# Patient Record
Sex: Male | Born: 1945 | Race: White | Hispanic: No | Marital: Married | Smoking: Former smoker
Health system: Southern US, Community
[De-identification: ages and names within clinical notes are randomized; demographics above are authoritative.]

## PROBLEM LIST (undated history)

## (undated) DIAGNOSIS — Z972 Presence of dental prosthetic device (complete) (partial): Secondary | ICD-10-CM

## (undated) DIAGNOSIS — H269 Unspecified cataract: Secondary | ICD-10-CM

## (undated) DIAGNOSIS — M1990 Unspecified osteoarthritis, unspecified site: Secondary | ICD-10-CM

## (undated) DIAGNOSIS — I739 Peripheral vascular disease, unspecified: Secondary | ICD-10-CM

## (undated) DIAGNOSIS — I639 Cerebral infarction, unspecified: Secondary | ICD-10-CM

## (undated) DIAGNOSIS — E039 Hypothyroidism, unspecified: Secondary | ICD-10-CM

## (undated) DIAGNOSIS — I2510 Atherosclerotic heart disease of native coronary artery without angina pectoris: Secondary | ICD-10-CM

## (undated) DIAGNOSIS — E119 Type 2 diabetes mellitus without complications: Secondary | ICD-10-CM

## (undated) DIAGNOSIS — J189 Pneumonia, unspecified organism: Secondary | ICD-10-CM

## (undated) DIAGNOSIS — I714 Abdominal aortic aneurysm, without rupture, unspecified: Secondary | ICD-10-CM

## (undated) DIAGNOSIS — I509 Heart failure, unspecified: Secondary | ICD-10-CM

## (undated) DIAGNOSIS — I10 Essential (primary) hypertension: Secondary | ICD-10-CM

## (undated) DIAGNOSIS — J449 Chronic obstructive pulmonary disease, unspecified: Secondary | ICD-10-CM

## (undated) DIAGNOSIS — I779 Disorder of arteries and arterioles, unspecified: Secondary | ICD-10-CM

## (undated) DIAGNOSIS — E785 Hyperlipidemia, unspecified: Secondary | ICD-10-CM

## (undated) DIAGNOSIS — Z973 Presence of spectacles and contact lenses: Secondary | ICD-10-CM

## (undated) DIAGNOSIS — R112 Nausea with vomiting, unspecified: Secondary | ICD-10-CM

## (undated) DIAGNOSIS — H409 Unspecified glaucoma: Secondary | ICD-10-CM

## (undated) DIAGNOSIS — A6920 Lyme disease, unspecified: Secondary | ICD-10-CM

## (undated) DIAGNOSIS — C801 Malignant (primary) neoplasm, unspecified: Secondary | ICD-10-CM

## (undated) DIAGNOSIS — Z9889 Other specified postprocedural states: Secondary | ICD-10-CM

## (undated) HISTORY — PX: NECK SURGERY: SHX720

## (undated) HISTORY — DX: Lyme disease, unspecified: A69.20

## (undated) HISTORY — DX: Type 2 diabetes mellitus without complications: E11.9

## (undated) HISTORY — DX: Atherosclerotic heart disease of native coronary artery without angina pectoris: I25.10

## (undated) HISTORY — DX: Hypothyroidism, unspecified: E03.9

## (undated) HISTORY — DX: Peripheral vascular disease, unspecified: I73.9

## (undated) HISTORY — PX: WISDOM TOOTH EXTRACTION: SHX21

## (undated) HISTORY — PX: CERVICAL DISC SURGERY: SHX588

## (undated) HISTORY — DX: Hyperlipidemia, unspecified: E78.5

## (undated) HISTORY — PX: CATARACT EXTRACTION, BILATERAL: SHX1313

## (undated) HISTORY — DX: Abdominal aortic aneurysm, without rupture, unspecified: I71.40

## (undated) HISTORY — DX: Essential (primary) hypertension: I10

## (undated) HISTORY — DX: Disorder of arteries and arterioles, unspecified: I77.9

## (undated) HISTORY — PX: JOINT REPLACEMENT: SHX530

## (undated) HISTORY — PX: EYE SURGERY: SHX253

## (undated) HISTORY — PX: MELANOMA EXCISION: SHX5266

## (undated) HISTORY — PX: REPLACEMENT TOTAL KNEE: SUR1224

## (undated) HISTORY — PX: ELBOW SURGERY: SHX618

## (undated) HISTORY — PX: CATARACT EXTRACTION: SUR2

---
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
## 1988-12-28 DIAGNOSIS — C801 Malignant (primary) neoplasm, unspecified: Secondary | ICD-10-CM

## 1988-12-28 HISTORY — DX: Malignant (primary) neoplasm, unspecified: C80.1

## 2003-11-09 ENCOUNTER — Ambulatory Visit (HOSPITAL_COMMUNITY): Admission: RE | Admit: 2003-11-09 | Discharge: 2003-11-10 | Payer: Self-pay | Admitting: Cardiology

## 2003-11-09 ENCOUNTER — Encounter: Payer: Self-pay | Admitting: Cardiology

## 2003-11-10 ENCOUNTER — Encounter: Payer: Self-pay | Admitting: Cardiology

## 2005-05-18 ENCOUNTER — Inpatient Hospital Stay (HOSPITAL_COMMUNITY): Admission: AD | Admit: 2005-05-18 | Discharge: 2005-05-21 | Payer: Self-pay | Admitting: Cardiology

## 2005-05-18 ENCOUNTER — Encounter: Payer: Self-pay | Admitting: Cardiology

## 2005-05-18 ENCOUNTER — Ambulatory Visit: Payer: Self-pay | Admitting: Cardiology

## 2005-05-19 ENCOUNTER — Encounter: Payer: Self-pay | Admitting: Cardiology

## 2005-05-20 ENCOUNTER — Ambulatory Visit: Payer: Self-pay | Admitting: Cardiology

## 2005-05-20 ENCOUNTER — Encounter: Payer: Self-pay | Admitting: Cardiology

## 2005-05-20 ENCOUNTER — Ambulatory Visit: Payer: Self-pay | Admitting: *Deleted

## 2005-05-21 ENCOUNTER — Encounter: Payer: Self-pay | Admitting: Cardiology

## 2005-06-15 ENCOUNTER — Ambulatory Visit: Payer: Self-pay | Admitting: Cardiology

## 2005-07-16 ENCOUNTER — Encounter: Payer: Self-pay | Admitting: Cardiology

## 2006-10-06 ENCOUNTER — Encounter: Payer: Self-pay | Admitting: Cardiology

## 2006-10-13 ENCOUNTER — Ambulatory Visit: Payer: Self-pay | Admitting: Cardiology

## 2006-10-13 ENCOUNTER — Encounter: Payer: Self-pay | Admitting: Cardiology

## 2007-05-09 ENCOUNTER — Encounter: Payer: Self-pay | Admitting: Cardiology

## 2007-11-14 ENCOUNTER — Encounter: Payer: Self-pay | Admitting: Cardiology

## 2008-12-03 ENCOUNTER — Encounter: Payer: Self-pay | Admitting: Cardiology

## 2009-04-27 ENCOUNTER — Encounter: Payer: Self-pay | Admitting: Cardiology

## 2009-04-27 ENCOUNTER — Encounter: Admission: RE | Admit: 2009-04-27 | Discharge: 2009-04-27 | Payer: Self-pay | Admitting: Neurosurgery

## 2009-12-09 ENCOUNTER — Encounter: Payer: Self-pay | Admitting: Cardiology

## 2009-12-13 ENCOUNTER — Encounter: Payer: Self-pay | Admitting: Cardiology

## 2010-06-23 ENCOUNTER — Encounter: Payer: Self-pay | Admitting: Cardiology

## 2010-07-03 ENCOUNTER — Encounter (INDEPENDENT_AMBULATORY_CARE_PROVIDER_SITE_OTHER): Payer: Self-pay | Admitting: Orthopedic Surgery

## 2010-07-03 ENCOUNTER — Inpatient Hospital Stay (HOSPITAL_COMMUNITY): Admission: RE | Admit: 2010-07-03 | Discharge: 2010-07-05 | Payer: Self-pay | Admitting: Orthopedic Surgery

## 2010-07-05 ENCOUNTER — Encounter: Payer: Self-pay | Admitting: Cardiology

## 2010-09-16 ENCOUNTER — Encounter: Payer: Self-pay | Admitting: Cardiology

## 2010-09-17 ENCOUNTER — Ambulatory Visit: Payer: Self-pay | Admitting: Cardiology

## 2010-09-21 ENCOUNTER — Encounter: Payer: Self-pay | Admitting: Cardiology

## 2010-12-28 HISTORY — PX: COLONOSCOPY: SHX174

## 2011-01-27 NOTE — Letter (Signed)
Summary: Discharge Summary  Discharge Summary   Imported By: Dorise Hiss 09/17/2010 10:18:02  _____________________________________________________________________  External Attachment:    Type:   Image     Comment:   External Document

## 2011-01-27 NOTE — Op Note (Signed)
Summary: Operative Report  Operative Report   Imported By: Dorise Hiss 09/17/2010 10:16:36  _____________________________________________________________________  External Attachment:    Type:   Image     Comment:   External Document

## 2011-01-27 NOTE — Miscellaneous (Signed)
  Clinical Lists Changes  Problems: Added new problem of PVD (ICD-443.9) Added new problem of CAD (ICD-414.00) Added new problem of DYSLIPIDEMIA (ICD-272.4) Added new problem of HYPERTENSION (ICD-401.9) Added new problem of HYPOTHYROIDISM (ICD-244.9) Added new problem of TOBACCO ABUSE (ICD-305.1) Observations: Added new observation of PAST MED HX: PAD   stent Left common iliac  (patent at cath 2006) CAD  cath 2006...moderate 2 vessel....(followed by nuclear-no ischemia) EF  normal...cath...2006... /  echo....2006  normal EF...no significant MR Dyslipidemia Hypertension Hypothyroidism Tobacco abuse Hx (09/16/2010 10:08)       Past History:  Past Medical History: PAD   stent Left common iliac  (patent at cath 2006) CAD  cath 2006...moderate 2 vessel....(followed by nuclear-no ischemia) EF  normal...cath...2006... /  echo....2006  normal EF...no significant MR Dyslipidemia Hypertension Hypothyroidism Tobacco abuse Hx

## 2011-01-27 NOTE — Progress Notes (Signed)
Summary: Office Visit/ [facility] OFFICE NOTE  Office Visit/ [place] OFFICE NOTE   Imported By: Dorise Hiss 09/17/2010 08:27:21  _____________________________________________________________________  External Attachment:    Type:   Image     Comment:   External Document

## 2011-01-27 NOTE — Miscellaneous (Signed)
  Clinical Lists Changes  Problems: Added new problem of CAROTID ARTERY DISEASE (ICD-433.10) Observations: Added new observation of PAST MED HX: PAD   stent Left common iliac  (patent at cath 2006) CAD  cath 2006...moderate 2 vessel....(followed by nuclear-no ischemia) EF  normal...cath...2006... /  echo....2006  normal EF...no significant MR Dyslipidemia Hypertension Hypothyroidism Tobacco abuse Hx...in the past Carotid artery disease   doppler (Dr. Sherryll Burger) ..50-69% bilateral  12/09/2009 (09/21/2010 15:43) Added new observation of PRIMARY MD: Ashish Shah,MD (09/21/2010 15:43)       Past History:  Past Medical History: PAD   stent Left common iliac  (patent at cath 2006) CAD  cath 2006...moderate 2 vessel....(followed by nuclear-no ischemia) EF  normal...cath...2006... /  echo....2006  normal EF...no significant MR Dyslipidemia Hypertension Hypothyroidism Tobacco abuse Hx...in the past Carotid artery disease   doppler (Dr. Sherryll Burger) ..50-69% bilateral  12/09/2009

## 2011-01-27 NOTE — Letter (Signed)
Summary: Discharge Summary  Discharge Summary   Imported By: Dorise Hiss 09/17/2010 08:32:38  _____________________________________________________________________  External Attachment:    Type:   Image     Comment:   External Document

## 2011-01-27 NOTE — Consult Note (Signed)
Summary: Consultation Report/ [facility]  Consultation Report/ [facility]   Imported By: Dorise Hiss 09/17/2010 08:31:02  _____________________________________________________________________  External Attachment:    Type:   Image     Comment:   External Document

## 2011-01-27 NOTE — Cardiovascular Report (Signed)
Summary: Cardiac Catheterization  Cardiac Catheterization   Imported By: Dorise Hiss 09/17/2010 10:14:52  _____________________________________________________________________  External Attachment:    Type:   Image     Comment:   External Document

## 2011-01-27 NOTE — Miscellaneous (Signed)
  Clinical Lists Changes  Observations: Added new observation of PAST MED HX: PAD   stent Left common iliac  (patent at cath 2006) CAD  cath 2006...moderate 2 vessel....(followed by nuclear-no ischemia)  /   nuclear  (Dr. Sherryll Burger)...12/13/2009...no ischemia...70%  EF EF  normal...cath...2006... /  echo....2006  normal EF...no significant MR Dyslipidemia Hypertension Hypothyroidism Tobacco abuse Hx...in the past Carotid artery disease   doppler (Dr. Sherryll Burger) ..50-69% bilateral  12/09/2009 (09/21/2010 15:47) Added new observation of PRIMARY MD: Ashish Shah,MD (09/21/2010 15:47)       Past History:  Past Medical History: PAD   stent Left common iliac  (patent at cath 2006) CAD  cath 2006...moderate 2 vessel....(followed by nuclear-no ischemia)  /   nuclear  (Dr. Sherryll Burger)...12/13/2009...no ischemia...70%  EF EF  normal...cath...2006... /  echo....2006  normal EF...no significant MR Dyslipidemia Hypertension Hypothyroidism Tobacco abuse Hx...in the past Carotid artery disease   doppler (Dr. Sherryll Burger) ..50-69% bilateral  12/09/2009

## 2011-01-27 NOTE — Assessment & Plan Note (Signed)
Summary: est last seen 2006   Visit Type:  Follow-up Primary Provider:  Beatrix Fetters Shah,MD  CC:  CAD.  History of Present Illness: Patient is seen for followup of coronary artery disease.  He is doing very well.  I saw him last in 2006.  I have reviewed the old notes.  I have also reviewed her last carotid Doppler available to me.  He has 60-79% bilateral disease.  This has been stable and is followed by Dr. Sherryll Burger.  The patient underwent total knee replacement in July, 2011.  He did very well.  Patient does not have any significant chest pain.  He is fully active.  He did have an exercise test in Dr.Shah's office in December, 2010.  We will obtain a copy of this.  Preventive Screening-Counseling & Management  Alcohol-Tobacco     Smoking Status: quit     Year Quit: 10/2003  Current Medications (verified): 1)  Metoprolol Tartrate 25 Mg Tabs (Metoprolol Tartrate) .... Take 1 Tablet By Mouth Two Times A Day 2)  Lipitor 40 Mg Tabs (Atorvastatin Calcium) .... Take 1 Tablet By Mouth Once A Day 3)  Levoxyl 175 Mcg Tabs (Levothyroxine Sodium) .... Take 1 Tablet By Mouth Once A Day 4)  Micardis Hct 80-25 Mg Tabs (Telmisartan-Hctz) .... Take 1 Tablet By Mouth Once A Day 5)  Aspir-Low 81 Mg Tbec (Aspirin) .... Take 1 Tablet By Mouth Once A Day 6)  Flonase 50 Mcg/act Susp (Fluticasone Propionate) .... 2 Sprays/nostril Daily 7)  Aleve 220 Mg Tabs (Naproxen Sodium) .... As Needed  Allergies (verified): 1)  ! * Azithromycin  Comments:  Nurse/Medical Assistant: The patient's medication list and allergies were reviewed with the patient and were updated in the Medication and Allergy Lists.  Past History:  Past Medical History: PAD   stent Left common iliac  (patent at cath 2006) CAD  cath 2006...moderate 2 vessel....(followed by nuclear-no ischemia) EF  normal...cath...2006... /  echo....2006  normal EF...no significant MR Dyslipidemia Hypertension Hypothyroidism Tobacco abuse Hx...in the  past  Social History: Smoking Status:  quit  Review of Systems       Patient denies fever, chills, headache, sweats, rash, change in vision, change in hearing, chest pain, cough, nausea vomiting, urinary symptoms.  All other systems are reviewed and are negative.  Vital Signs:  Patient profile:   65 year old male Height:      68 inches Weight:      191 pounds BMI:     29.15 Pulse rate:   60 / minute BP sitting:   133 / 83  (left arm) Cuff size:   regular  Vitals Entered By: Carlye Grippe (September 17, 2010 10:59 AM)  Nutrition Counseling: Patient's BMI is greater than 25 and therefore counseled on weight management options.  Physical Exam  General:  patient is stable in general. Head:  head is atraumatic. Eyes:  no xanthelasma. Neck:  no jugular venous distention. Chest Wall:  no chest wall tenderness. Lungs:  lungs are clear.  Respiratory effort is nonlabored. Heart:  cardiac exam reveals S1-S2.  No clicks or significant murmurs. Abdomen:  abdomen is soft. Msk:  no musculoskeletal deformities. Extremities:  no peripheral edema. Skin:  no skin rashes. Psych:  patient is oriented to person time and place.  Affect is normal.   Impression & Recommendations:  Problem # 1:  TOBACCO ABUSE (ICD-305.1) Patient stopped smoking in the past.  Problem # 2:  HYPOTHYROIDISM (ICD-244.9)  His updated medication list for this problem  includes:    Levoxyl 175 Mcg Tabs (Levothyroxine sodium) .Marland Kitchen... Take 1 tablet by mouth once a day Patient's thyroid is treated.  No change.  Problem # 3:  HYPERTENSION (ICD-401.9)  His updated medication list for this problem includes:    Metoprolol Tartrate 25 Mg Tabs (Metoprolol tartrate) .Marland Kitchen... Take 1 tablet by mouth two times a day    Micardis Hct 80-25 Mg Tabs (Telmisartan-hctz) .Marland Kitchen... Take 1 tablet by mouth once a day    Aspir-low 81 Mg Tbec (Aspirin) .Marland Kitchen... Take 1 tablet by mouth once a day Blood pressure is under good control.  No change in  therapy.  Problem # 4:  CAD (ICD-414.00)  His updated medication list for this problem includes:    Metoprolol Tartrate 25 Mg Tabs (Metoprolol tartrate) .Marland Kitchen... Take 1 tablet by mouth two times a day    Aspir-low 81 Mg Tbec (Aspirin) .Marland Kitchen... Take 1 tablet by mouth once a day  Orders: EKG w/ Interpretation (93000) EKGs done today and reviewed by me.  There is mild sinus bradycardia.  Otherwise EKG is normal.  Is not having any significant symptoms.  He is on appropriate medications.  He has an exercise test in December and I told that showed no marked abnormalities.  We will obtain a copy of the report from Dr.Shah.  Otherwise I'll see him back in one year for cardiology followup.  Patient Instructions: 1)  Your physician wants you to follow-up in: 1 year. You will receive a reminder letter in the mail one-two months in advance. If you don't receive a letter, please call our office to schedule the follow-up appointment. 2)  Your physician recommends that you continue on your current medications as directed. Please refer to the Current Medication list given to you today.

## 2011-01-27 NOTE — Letter (Signed)
Summary: Discharge Summary  Discharge Summary   Imported By: Dorise Hiss 09/17/2010 08:22:07  _____________________________________________________________________  External Attachment:    Type:   Image     Comment:   External Document

## 2011-03-15 LAB — APTT: aPTT: 34 seconds (ref 24–37)

## 2011-03-15 LAB — BASIC METABOLIC PANEL
BUN: 10 mg/dL (ref 6–23)
BUN: 8 mg/dL (ref 6–23)
CO2: 27 mEq/L (ref 19–32)
CO2: 31 mEq/L (ref 19–32)
Calcium: 7.7 mg/dL — ABNORMAL LOW (ref 8.4–10.5)
Calcium: 8.1 mg/dL — ABNORMAL LOW (ref 8.4–10.5)
Chloride: 104 mEq/L (ref 96–112)
Chloride: 105 mEq/L (ref 96–112)
Creatinine, Ser: 0.82 mg/dL (ref 0.4–1.5)
Creatinine, Ser: 0.82 mg/dL (ref 0.4–1.5)
GFR calc Af Amer: >60 mL/min (ref >60)
GFR calc Af Amer: >60 mL/min (ref >60)
GFR calc non Af Amer: >60 mL/min (ref >60)
GFR calc non Af Amer: >60 mL/min (ref >60)
Glucose, Bld: 109 mg/dL — ABNORMAL HIGH (ref 70–99)
Glucose, Bld: 132 mg/dL — ABNORMAL HIGH (ref 70–99)
Potassium: 3.8 mEq/L (ref 3.5–5.1)
Potassium: 3.9 mEq/L (ref 3.5–5.1)
Sodium: 136 mEq/L (ref 135–145)
Sodium: 140 mEq/L (ref 135–145)

## 2011-03-15 LAB — URINALYSIS, ROUTINE W REFLEX MICROSCOPIC
Bilirubin Urine: NEGATIVE
Glucose, UA: NEGATIVE mg/dL
Hgb urine dipstick: NEGATIVE
Ketones, ur: NEGATIVE mg/dL
Nitrite: NEGATIVE
Protein, ur: NEGATIVE mg/dL
Specific Gravity, Urine: 1.021 (ref 1.005–1.030)
Urobilinogen, UA: 1 mg/dL (ref 0.0–1.0)
pH: 6 (ref 5.0–8.0)

## 2011-03-15 LAB — DIFFERENTIAL
Basophils Absolute: 0 10*3/uL (ref 0.0–0.1)
Basophils Relative: 0 % (ref 0–1)
Lymphocytes Relative: 33 % (ref 12–46)
Neutro Abs: 2.2 10*3/uL (ref 1.7–7.7)
Neutrophils Relative %: 53 % (ref 43–77)

## 2011-03-15 LAB — CBC
HCT: 27.6 % — ABNORMAL LOW (ref 39.0–52.0)
HCT: 28.8 % — ABNORMAL LOW (ref 39.0–52.0)
HCT: 40.8 % (ref 39.0–52.0)
HCT: 42 % (ref 39.0–52.0)
Hemoglobin: 10.1 g/dL — ABNORMAL LOW (ref 13.0–17.0)
Hemoglobin: 14.3 g/dL (ref 13.0–17.0)
Hemoglobin: 9.9 g/dL — ABNORMAL LOW (ref 13.0–17.0)
Hemoglobin: 14.7 g/dL (ref 13.0–17.0)
MCH: 31.6 pg (ref 26.0–34.0)
MCH: 31.6 pg (ref 26.0–34.0)
MCH: 32 pg (ref 26.0–34.0)
MCH: 31.5 pg (ref 26.0–34.0)
MCHC: 35.1 g/dL (ref 30.0–36.0)
MCHC: 35.2 g/dL (ref 30.0–36.0)
MCHC: 35.8 g/dL (ref 30.0–36.0)
MCHC: 35 g/dL (ref 30.0–36.0)
MCV: 89.5 fL (ref 78.0–100.0)
MCV: 90 fL (ref 78.0–100.0)
MCV: 90 fL (ref 78.0–100.0)
MCV: 90.1 fL (ref 78.0–100.0)
Platelets: 111 10*3/uL — ABNORMAL LOW (ref 150–400)
Platelets: 90 10*3/uL — ABNORMAL LOW (ref 150–400)
Platelets: 92 10*3/uL — ABNORMAL LOW (ref 150–400)
Platelets: 112 10*3/uL — ABNORMAL LOW (ref 150–400)
RBC: 3.09 MIL/uL — ABNORMAL LOW (ref 4.22–5.81)
RBC: 3.2 MIL/uL — ABNORMAL LOW (ref 4.22–5.81)
RBC: 4.54 MIL/uL (ref 4.22–5.81)
RBC: 4.66 MIL/uL (ref 4.22–5.81)
RDW: 12.3 % (ref 11.5–15.5)
RDW: 13.1 % (ref 11.5–15.5)
RDW: 13.2 % (ref 11.5–15.5)
RDW: 13.3 % (ref 11.5–15.5)
WBC: 4.6 10*3/uL (ref 4.0–10.5)
WBC: 5.8 10*3/uL (ref 4.0–10.5)
WBC: 6.4 10*3/uL (ref 4.0–10.5)
WBC: 4.1 10*3/uL (ref 4.0–10.5)

## 2011-03-15 LAB — COMPREHENSIVE METABOLIC PANEL
Alkaline Phosphatase: 53 U/L (ref 39–117)
BUN: 15 mg/dL (ref 6–23)
Chloride: 105 mEq/L (ref 96–112)
Creatinine, Ser: 0.82 mg/dL (ref 0.4–1.5)
GFR calc non Af Amer: >60 mL/min (ref >60)
Glucose, Bld: 143 mg/dL — ABNORMAL HIGH (ref 70–99)
Potassium: 3.5 mEq/L (ref 3.5–5.1)
Total Bilirubin: 1.4 mg/dL — ABNORMAL HIGH (ref 0.3–1.2)

## 2011-03-15 LAB — PROTIME-INR
INR: 1.04 (ref 0.00–1.49)
Prothrombin Time: 13.5 seconds (ref 11.6–15.2)

## 2011-03-15 LAB — CROSSMATCH
ABO/RH(D): O POS
Antibody Screen: NEGATIVE

## 2011-03-15 LAB — ABO/RH: ABO/RH(D): O POS

## 2011-03-15 LAB — SURGICAL PCR SCREEN: Staphylococcus aureus: NEGATIVE

## 2011-05-15 NOTE — Discharge Summary (Signed)
NAMENARAYAN, SCULL              ACCOUNT NO.:  [id number]   MEDICAL RECORD NO.:  [id number]          PATIENT TYPE:  INP   LOCATION:  2009                         FACILITY:  [facility]   PHYSICIAN:  Jonelle Sidle, M.D. LHCDATE OF BIRTH:  [birth year]   DATE OF ADMISSION:  05/19/2005  DATE OF DISCHARGE:  05/21/2005                                 DISCHARGE SUMMARY   PROCEDURES:  1.  Cardiac catheterization on May 19, 2005.  2.  Adenosine Myoview on May 20, 2005.   REASON FOR ADMISSION:  Mr. Seago is a 65 year old male, with no known  history of coronary artery disease, a history of peripheral vascular  disease, status post previous stenting of the left common iliac artery, and  multiple cardiac risks, who initially presented to be tested with  progressive chest discomfort.  Please refer to Dr. Remi Deter McDowell's initial  consultation for details.   LABORATORY DATA:  CBC normal at discharge.  Potassium 3.7, BUN 16,  creatinine 1.1 at discharge.  Hemoglobin A1c 6.1.  Cardiac enzymes:  Normal  troponin markers with marginally elevated MB.  Lipid profile:  Total  cholesterol 130, triglyceride 234, HDL 33, LDL 50.   Admission chest x-ray:  Mild bronchitic changes.   HOSPITAL COURSE:  Following transfer from [facility], where the  patient initially presented with progressive chest discomfort, the patient  was stabilized on a medication regimen including aspirin, beta blocker, and  Lovenox.   Serial cardiac markers were negative for ischemia.  The patient was referred  for cardiac catheterization, performed on May 19, 2005, by Carole Binning, M.D., ___________.  Moderate two-vessel coronary artery of  borderline severity.  Left ventricular function was normal with a question  of mitral regurgitation possibly secondary to ventricular ectopy.   The distal aortogram was performed revealing mildly patent left iliac stent  and no significant disease of the right iliac.   Dr.  Gerri Spore ordered a follow-up adenosine Myoview.  Impression:  This  revealed normal perfusion.   Additionally, a 2-D echocardiogram was done for assessment of mitral  regurgitation:  This revealed normal left ventricular function and no  significant mitral regurgitation.   The patient was cleared for discharge on hospital #3, by Willa Rough, M.D.,  which included the patient had no obvious cardiac basis for the dyspnea or  chest discomfort.   The patient was instructed to resume all previous home medications.   DISCHARGE MEDICATIONS:  1.  Coated aspirin 81 mg daily.  2.  Micardis/hydrochlorothiazide/question HCTZ 80/25 mg daily.  3.  Lipitor 40 mg daily.  4.  Synthroid 0.175 mg daily.  5.  Nitroglycerin 0.4 mg as per instructions.   DISCHARGE INSTRUCTIONS:  No strenuous activity or driving until instructed.   DISCHARGE DIET:  Maintain low-fat/cholesterol diet.   FOLLOW UP:  Call the office if there is any swelling/bleeding in the groin.   The patient was cleared to return to work on Sunday evening.   The patient will follow up with Willa Rough, M.D. on Monday, June 15, 2005,  at 1:15 p.m. at [facility] in [place].  DISCHARGE DIAGNOSES:  1.  Noncardiac chest discomfort.      1.  Normal serial cardiac markers.      2.  Moderate two-vessel coronary artery disease of borderline severity          by cardiac catheterization, May 19, 2005.      3.  Normal adenosine Myoview __________.      4.  Normal left ventricular function.  2.  Peripheral vascular disease.      1.  Widely patent left iliac stent.  3.  Hyperlipidemia.  4.  Hypertension.  5.  Hypothyroidism.  6.  No history of tobacco.      GS/MEDQ  D:  05/21/2005  T:  05/21/2005  Job:  [id number]   cc:   Weyman Pedro, M.D.  [place], [place]   [facility]  [address]  [place], [place]

## 2011-05-15 NOTE — Op Note (Signed)
Christopher Randolph, KOHLBECK                        ACCOUNT NO.:  [id number]   MEDICAL RECORD NO.:  [id number]                   PATIENT TYPE:  OIB   LOCATION:  2856                                 FACILITY:  [facility]   PHYSICIAN:  Salvadore Farber, M.D.             DATE OF BIRTH:  May 19, 1946   DATE OF PROCEDURE:  11/09/2003  DATE OF DISCHARGE:                                 OPERATIVE REPORT   PROCEDURE:  Abdominal  aortography with bilateral lower extremity runoff,  left iliac PTA and stenting.   INDICATIONS FOR PROCEDURE:  Christopher Randolph is a 65 year old gentleman with  hypertension, dyslipidemia, and ongoing tobacco use. He presented with 18  months of left buttock and calf claudication. His exercise tolerance had  decreased  such that he developed pain with walking 50 to 100 feet of level  ground. He had had no rest pain or ulcerations. Ankle-brachial indices  performed October 04, 2003, demonstrated right 1.2 and left 0.81 with  monophasic flow in the left common femoral. Due to his lifestyle limiting  symptoms and evidence on physical examination and noninvasive study of left  iliac disease, he is referred for angiography with an eye to percutaneous  intervention.   DESCRIPTION OF PROCEDURE:  Informed consent was obtained. Under 1% Lidocaine  local anesthesia a 5 French sheath was placed in the right femoral artery  using the modified Seldinger technique. A pigtail catheter was advanced into  the suprarenal abdominal  aorta. Abdominal aortography was performed by  power injection. The pigtail catheter was then pulled back to the distal  abdominal  aorta. Abdominal  aortography with runoff down both legs to both  feet was performed using power injection and a step-table technique. This  demonstrated normal vessels on the right. On the left there was a 90%  stenosis of the ostium of the common internal iliac artery. The decision was  made to treat this.   Then 5000 units of heparin   was administered. A 6 French 35-cm sheath was  placed using the modified Seldinger technique in the left common femoral  artery. A Wholey wire was advanced beyond the lesion without difficulty. A J-  wire was positioned via the right common femoral sheath in the abdominal  aorta. The left common iliac stenosis was then dilated using an 8 x 20 mm  Powerflex at 6 atmospheres. There was a suboptimal angiographic result.  Therefore the decision was made to stent.   A [id number] stent was deployed at 10 atmospheres. Repeat angiogram via pigtail  catheter positioned from the right groin in the abdominal  aorta  demonstrated mild residual stenosis. The stent was then redilated using a 10  x 20 mm Powerflex at 10 atmospheres for 30 seconds. Bilateral femoral  angiography  was then performed. The right common femoral arteriotomy was  then closed using a 6 [place]  Angioseal device. The left common femoral  arteriotomy was  closed using an 8 [place] Angioseal device.   Approximately 15 minutes after the completion of the procedure, the patient  decreased  his heart rate to 35 and systolic blood pressure to 80. He denied  any flank or abdominal pain and had no flank or abdominal  tenderness. There  was no hematoma evident. Nevertheless bilateral groin pressure was held out  of  concern for failure of the Angioseal device with development of  retroperitoneal hematoma. Intravenous fluids were administered and atropine  was given. Protamine was also administered to reverse his anticoagulation.  With these measures blood pressure improved  to 120/77. The patient left the  procedure room in stable condition.   COMPLICATIONS:  Transient hypertension.    IMPRESSION/RECOMMENDATIONS:  Successful angioplasty and subsequent stenting  of the left common iliac artery resulting in no residual  stenosis and no  residual gradient on pullback. Will continue aspirin  and Plavix.   There is concern for development of a  retroperitoneal hematoma. Blood  pressure is stabilized. Will check a CT scan.                                               Salvadore Farber, M.D.    WED/MEDQ  D:  11/09/2003  T:  11/10/2003  Job:  [id number]   cc:   Willa Rough, M.D.   Weyman Pedro, M.D.

## 2011-05-15 NOTE — Discharge Summary (Signed)
NAMEPENN, GRISSETT                        ACCOUNT NO.:  [id number]   MEDICAL RECORD NO.:  [id number]                   PATIENT TYPE:  OIB   LOCATION:  12-May-2904                                 FACILITY:  [facility]   PHYSICIAN:  Willa Rough, M.D.                  DATE OF BIRTH:  [birth year]   DATE OF ADMISSION:  11/09/2003  DATE OF DISCHARGE:  11/10/2003                                 DISCHARGE SUMMARY   HISTORY OF PRESENT ILLNESS:  This is a pleasant, 65 year old, white male,  who is followed by Salvadore Farber, M.D. in the group who was admitted to  go under abdominal aortography with runoff imaging in agreement to have  percutaneous intervention if his anatomy was amenable.  This was set up from  an outpatient evaluation where the patient did have the classic symptoms for  claudication on exam and ankle-brachial indices suggestive of unilateral  disease on the left.  For this reason, Dr. Samule Ohm initiated the patient on  aspirin and Plavix and planned percutaneous intervention if necessary.   PAST MEDICAL HISTORY:  1. Hypertension.  2. Dyslipidemia.  3. Borderline diabetes.  4. Hypothyroidism.  5. History of perianal abscess in 12-May-1982.  6. History of melanoma, right leg, 1990,  7. Cervical disk surgery in May 13, 1983 with subsequent diskectomy in 05-13-95.   ALLERGIES:  No known drug allergies.   SOCIAL HISTORY:  The patient is divorced and lives alone.  He was a Oncologist at [employer].  He has two daughters, one of whom is a  Advice worker in [place].  He currently smokes 1-1/2 packs cigarettes  per day, and he has smoked for approximately 30 years, occasional alcohol  use.   FAMILY HISTORY:  Father died in 05/12/72, did have heart disease in a setting of  diabetes and Alzheimer's.  Mother is alive at age 26 with a history of heart  problems and hypertension.  He has a brother, who is in generally good  health at age 17.   HOSPITAL COURSE:  As noted, the patient was  admitted to [facility] for  further evaluation of his left leg claudication.  He did undergo abdominal  aortography with runoff and subsequently left iliac PTCA/stent.  The  findings on the procedure was diffuse disease of the abdominal aorta without  stenosis, right CIA, ICA, EIA, CFA, SPA, and poplitea all normal.  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  The patient  underwent successful PTA and stenting of the left common iliac artery.  About 15 minutes after the procedure, the patient's heart rate decreased to  35.  Systolic blood pressure decreased to 80.  No flank or abdominal pain.  No __________ was evident.  Groin pressure withheld, IV fluids given,  Atropine given.  BP improved to 120/77 despite no evidence of bleeding, I am  concerned with  retroperitoneal bleed.  An abdominal CT was checked which was  found to be negative.  The patient was kept overnight for observation.  The  day of discharge, the patient was doing well.  No vagal reaction, ambulating  without shortness of breath or chest pain or any dizziness.  He was ok to go  to [facility] today.  Left groin was without __________.   LABORATORY VALUES DAY OF DISCHARGE:  CBC:  Hemoglobin 13.1, hematocrit 38.1,  PT 12.8, INR 0.9.  Sodium was 138, potassium 4.1, chloride 109.  BUN and  creatinine 11 and 0.9.  Blood sugars 93, calcium 8.2.  CT scan of the  abdomen was negative in respect to retroperitoneal hematoma within normal  limits.   DISCHARGE MEDICATIONS:  1. The patient was told to stay on medications as prior to admission     including Levoxyl 0.175 mcg daily.  2. Flonase daily.  3. Enteric-coated aspirin 325 mg daily.  4. Plavix 75 mg 1 daily.  5. He was told to discontinue his verapamil and decrease his Micardis dose     to 40 mg daily secondary to his decrease in blood pressure in the     hospital.   The patient was told to avoid any strenuous, no lifting, driving, sexual  activity,  heavy exertion for two days.  Low fat, low salt and cholesterol  diet.  He is to observe the cath site for bruising, swelling, drainage, or  discomfort.  Also to check his blood pressure.  If it is below 135/85, he is  to call our office or primary MD if he sees his blood pressures trending up  secondary to being off his one medication now.  He will have an office visit  with Salvadore Farber, M.D. in two weeks for a follow-up appointment.  The  office will call him for this.   DISCHARGE DIAGNOSES:  1. 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  2. Hypertension.  3. Hyperlipidemia.  4. Borderline diabetes.  5. Abdominal aortography with runoff showing normal anatomy except for the     left common iliac artery which was stented.  6. Vagal reaction, believed to be secondary to blood pressure medicine;     blood pressure medicines were adjusted and discontinued with a blood     pressure of 118/60.      [place], [place]                       Willa Rough, M.D.    MP/MEDQ  D:  11/10/2003  T:  11/10/2003  Job:  [id number]   cc:   Salvadore Farber, M.D.   Willa Rough, M.D.   Weyman Pedro, MD

## 2011-05-15 NOTE — Cardiovascular Report (Signed)
NAMENASHAUN, HILLMER              ACCOUNT NO.:  [id number]   MEDICAL RECORD NO.:  [id number]          PATIENT TYPE:  INP   LOCATION:  2009                         FACILITY:  [facility]   PHYSICIAN:  Carole Binning, M.D. LHCDATE OF BIRTH:  1946/03/14   DATE OF PROCEDURE:  05/19/2005  DATE OF DISCHARGE:                              CARDIAC CATHETERIZATION   PROCEDURE:  Left heart catheterization with coronary angiography and left  ventriculography.   INDICATIONS:  The patient is a 65 year old male with history of peripheral  vascular disease, status post left iliac stent placement. He has had  symptoms of progressive exertional chest pain. He was admitted to [facility] and referred for cardiac catheterization.   PROCEDURE:  A 6-French sheath was placed in the right femoral artery.  Coronary angiography was performed with standard Judkins 6-French catheters.  Left ventriculography and abdominal aortography exams were performed with an  angled pigtail catheter. Contrast was Omnipaque. There were no  complications.   HEMODYNAMIC DATA:  1.  Left ventricular pressure 108/15.  2.  Aortic pressure 108/68.  3.  There is no significant aortic valve gradient on catheter pullback.   LEFT VENTRICULOGRAM:  Wall motion is normal. Ejection fraction is estimated  at 60%. There is some degree of mitral regurgitation present; however, it  appears to be related to ventricular ectopy.   ABDOMINAL AORTOGRAM:  Reveals patent renal arteries. There is moderate  diffuse atherosclerosis of the distal abdominal aorta. There is a stent in  the left common iliac artery which is patent with less than 20% stenosis  within the proximal portion of the stent. The iliac arteries are otherwise  patent with mild diffuse atherosclerotic disease.   CORONARY ARTERIOGRAPHY:  1.  Left main is normal.  2.  Left anterior descending artery has a 20% stenosis in the proximal      vessel. In the mid-vessel, there  is a 30% stenosis and in the distal      vessel there is a 20% stenosis. The LAD gives rise to a small first and      second diagonal branches and a small to normal size third diagonal      branch. There is a 70% stenosis in the ostium at a third diagonal      branch.  3.  Left circumflex gives rise to a large first obtuse marginal and a normal      size second obtuse marginal branch. There is a 20% stenosis in the      proximal circumflex. The first obtuse marginal has a diffuse 70%      stenosis beginning at its origin and extending into the proximal vessel.      The third obtuse marginal branch has a 30% stenosis proximally.  4.  Right coronary artery is a dominant vessel. There is a 50% stenosis in      the proximal vessel, 20% in the midvessel. The distal right coronary      artery gives rise to a normal size posterior descending artery and a      small posterolateral branch. There is a  20% stenosis in the posterior      descending artery.   IMPRESSION:  1.  Normal left ventricular systolic function with question of mitral      regurgitation.  2.  Atherosclerosis of the abdominal aorta with a patent stent in the left      iliac artery.  3.  Two vessel coronary artery disease characterized by stenoses of      borderline severity involving a large obtuse marginal branch and a small      to normal size diagonal branch. There is moderate but nonobstructive      disease in the right coronary artery.   PLAN:  We will proceed with a stress nuclear study to identify and localize  ischemia. If ischemia is demonstrated in the distribution of the obtuse  marginal branch, then would proceed with percutaneous coronary intervention  of this vessel. If there is no significant ischemia on this study, then  would recommend medical therapy.      MWP/MEDQ  D:  05/19/2005  T:  05/20/2005  Job:  [id number]   cc:   Nena Jordan

## 2011-12-07 ENCOUNTER — Ambulatory Visit: Payer: Self-pay | Admitting: Cardiology

## 2011-12-25 ENCOUNTER — Encounter: Payer: Self-pay | Admitting: *Deleted

## 2012-01-03 ENCOUNTER — Encounter: Payer: Self-pay | Admitting: Cardiology

## 2012-01-03 DIAGNOSIS — Z87891 Personal history of nicotine dependence: Secondary | ICD-10-CM | POA: Insufficient documentation

## 2012-01-03 DIAGNOSIS — E039 Hypothyroidism, unspecified: Secondary | ICD-10-CM | POA: Insufficient documentation

## 2012-01-03 DIAGNOSIS — IMO0002 Reserved for concepts with insufficient information to code with codable children: Secondary | ICD-10-CM | POA: Insufficient documentation

## 2012-01-03 DIAGNOSIS — R9430 Abnormal result of cardiovascular function study, unspecified: Secondary | ICD-10-CM | POA: Insufficient documentation

## 2012-01-03 DIAGNOSIS — I10 Essential (primary) hypertension: Secondary | ICD-10-CM | POA: Insufficient documentation

## 2012-01-03 DIAGNOSIS — I2510 Atherosclerotic heart disease of native coronary artery without angina pectoris: Secondary | ICD-10-CM | POA: Insufficient documentation

## 2012-01-03 DIAGNOSIS — I739 Peripheral vascular disease, unspecified: Secondary | ICD-10-CM | POA: Insufficient documentation

## 2012-01-03 DIAGNOSIS — E785 Hyperlipidemia, unspecified: Secondary | ICD-10-CM | POA: Insufficient documentation

## 2012-01-04 ENCOUNTER — Ambulatory Visit (INDEPENDENT_AMBULATORY_CARE_PROVIDER_SITE_OTHER): Payer: BC Managed Care – PPO | Admitting: Cardiology

## 2012-01-04 ENCOUNTER — Encounter: Payer: Self-pay | Admitting: Cardiology

## 2012-01-04 VITALS — BP 117/76 | HR 55 | Ht 68.0 in | Wt 191.0 lb

## 2012-01-04 DIAGNOSIS — E785 Hyperlipidemia, unspecified: Secondary | ICD-10-CM

## 2012-01-04 DIAGNOSIS — Z87891 Personal history of nicotine dependence: Secondary | ICD-10-CM

## 2012-01-04 DIAGNOSIS — I10 Essential (primary) hypertension: Secondary | ICD-10-CM

## 2012-01-04 DIAGNOSIS — I2510 Atherosclerotic heart disease of native coronary artery without angina pectoris: Secondary | ICD-10-CM

## 2012-01-04 DIAGNOSIS — E039 Hypothyroidism, unspecified: Secondary | ICD-10-CM

## 2012-01-04 DIAGNOSIS — Z79899 Other long term (current) drug therapy: Secondary | ICD-10-CM

## 2012-01-04 DIAGNOSIS — I779 Disorder of arteries and arterioles, unspecified: Secondary | ICD-10-CM | POA: Insufficient documentation

## 2012-01-04 NOTE — Progress Notes (Signed)
HPI  Patient is seen to followup coronary artery disease. I saw him last September, 2011. He had carotid Dopplers recently and his primary care office. He was told that things were stable. He has known coronary disease. His exercise test done in December, 2010 is reported as showing no significant abnormality. He's not having any chest pain or shortness of breath.  As part of today's evaluation I have reviewed the patient's old records and completely updated the new electronic medical record  Allergies  Allergen Reactions  . Azithromycin     REACTION: swelling    Current Outpatient Prescriptions  Medication Sig Dispense Refill  . aspirin EC 81 MG tablet Take 81 mg by mouth daily.        Marland Kitchen atorvastatin (LIPITOR) 40 MG tablet Take 40 mg by mouth daily.        . cyclobenzaprine (FLEXERIL) 10 MG tablet Take 10 mg by mouth 3 (three) times daily as needed.        Marland Kitchen levothyroxine (SYNTHROID, LEVOTHROID) 175 MCG tablet Take 175 mcg by mouth daily.        . metoprolol tartrate (LOPRESSOR) 25 MG tablet Take 25 mg by mouth 2 (two) times daily.        . nabumetone (RELAFEN) 500 MG tablet Take 1 tablet by mouth 2 (two) times daily as needed.       Marland Kitchen telmisartan-hydrochlorothiazide (MICARDIS HCT) 80-25 MG per tablet Take 1 tablet by mouth daily.          History   Social History  . Marital Status: Married    Spouse Name: N/A    Number of Children: N/A  . Years of Education: N/A   Occupational History  . Not on file.   Social History Main Topics  . Smoking status: Former Smoker -- 1.5 packs/day for 40 years    Types: Cigarettes    Quit date: 10/29/2003  . Smokeless tobacco: Never Used  . Alcohol Use: Not on file  . Drug Use: Not on file  . Sexually Active: Not on file   Other Topics Concern  . Not on file   Social History Narrative  . No narrative on file    No family history on file.  Past Medical History  Diagnosis Date  . PAD (peripheral artery disease)      stent Left  common iliac  (patent at cath 2006)  . CAD (coronary artery disease)     cath 2006...moderate 2 vessel....(followed by nuclear-no ischemia) EF  normal...cath...2006... /  echo....2006  normal EF...no significant MR  . Dyslipidemia   . Hypertension   . Hypothyroidism   . History of tobacco abuse     in the past  . Ejection fraction     EF normal, echo, 2006    No past surgical history on file.  ROS   Patient denies fever, chills, headache, sweats, rash, change in vision, change in hearing, chest pain, cough, nausea vomiting, urinary symptoms. All other systems are reviewed and are negative.  PHYSICAL EXAM Patient is oriented to person time and place. Affect is normal. He is here with his wife. We've congratulated his wife are having stopped smoking also. There is no jugulovenous distention. Lungs are clear. Respiratory effort is nonlabored. Cardiac exam reveals S1 and S2. There no clicks or significant murmurs. The abdomen is soft. There is no peripheral edema. There no musculoskeletal deformities. There are no skin rashes.  Filed Vitals:   01/04/12 1305  BP: 117/76  Pulse:  55  Height: 5\' 8"  (1.727 m)  Weight: 191 lb (86.637 kg)    EKG  EKG is done today and reviewed by me. There is normal sinus rhythm with mild sinus bradycardia. There is no acute change.  ASSESSMENT & PLAN

## 2012-01-04 NOTE — Assessment & Plan Note (Signed)
Patient quit smoking in the past. In addition his wife has now stopped smoking

## 2012-01-04 NOTE — Assessment & Plan Note (Signed)
Blood pressure is nicely controlled. No change in therapy. 

## 2012-01-04 NOTE — Patient Instructions (Signed)
Your physician you to follow up in 1 year. You will receive a reminder letter in the mail one-two months in advance. If you don't receive a letter, please call our office to schedule the follow-up appointment. Your physician recommends that you continue on your current medications as directed. Please refer to the Current Medication list given to you today. Your physician recommends that you go to the [facility] for a FASTING lipid profile and liver function labs. Do not eat or drink after midnight.  If the results of your test are normal or stable, you will receive a letter. If they are abnormal, the nurse will contact you by phone.

## 2012-01-04 NOTE — Assessment & Plan Note (Signed)
Patient has significant carotid artery disease. His Dopplers are being followed through his primary care office.

## 2012-01-04 NOTE — Assessment & Plan Note (Signed)
Coronary disease is stable. He has not any testing at this time as he had a nuclear scan in December, 2010. He is no significant symptoms.

## 2012-01-04 NOTE — Assessment & Plan Note (Signed)
Patient is on medication for his lipids. He tells me that he has not had a recent lipid study and this will be obtained.

## 2012-01-04 NOTE — Assessment & Plan Note (Signed)
He is on medications for thyroid.

## 2012-01-12 ENCOUNTER — Encounter: Payer: Self-pay | Admitting: *Deleted

## 2012-12-02 ENCOUNTER — Ambulatory Visit (INDEPENDENT_AMBULATORY_CARE_PROVIDER_SITE_OTHER): Payer: BC Managed Care – PPO | Admitting: Physician Assistant

## 2012-12-02 ENCOUNTER — Encounter: Payer: Self-pay | Admitting: Physician Assistant

## 2012-12-02 VITALS — BP 119/70 | HR 57 | Ht 68.0 in | Wt 191.4 lb

## 2012-12-02 DIAGNOSIS — I2510 Atherosclerotic heart disease of native coronary artery without angina pectoris: Secondary | ICD-10-CM

## 2012-12-02 DIAGNOSIS — E785 Hyperlipidemia, unspecified: Secondary | ICD-10-CM

## 2012-12-02 DIAGNOSIS — I779 Disorder of arteries and arterioles, unspecified: Secondary | ICD-10-CM

## 2012-12-02 MED ORDER — NITROGLYCERIN 0.4 MG SL SUBL: 0.4000 mg | SUBLINGUAL_TABLET | SUBLINGUAL | Status: DC | PRN

## 2012-12-02 NOTE — Progress Notes (Signed)
Patient states he already has carotid doppler scheduled at Dr. Sherril Croon on 12/23.

## 2012-12-02 NOTE — Assessment & Plan Note (Signed)
Followed by primary M.D. 

## 2012-12-02 NOTE — Progress Notes (Signed)
Primary Cardiologist: Jerral Bonito, MD   HPI: Presents for annual followup.  He denies any interim development of exertional CP. He has since run out of nitroglycerin tablets.   Twelve-lead EKG today, reviewed by me, indicates SB at 59 bpm; no ischemic changes.  Allergies  Allergen Reactions  . Azithromycin     REACTION: swelling    Current Outpatient Prescriptions  Medication Sig Dispense Refill  . aspirin EC 81 MG tablet Take 81 mg by mouth daily.        Marland Kitchen atorvastatin (LIPITOR) 40 MG tablet Take 40 mg by mouth every morning.       Marland Kitchen levothyroxine (SYNTHROID, LEVOTHROID) 175 MCG tablet Take 175 mcg by mouth daily.        . metoprolol tartrate (LOPRESSOR) 25 MG tablet Take 25 mg by mouth 2 (two) times daily.        Marland Kitchen telmisartan-hydrochlorothiazide (MICARDIS HCT) 80-25 MG per tablet Take 1 tablet by mouth daily.        . nitroGLYCERIN (NITROSTAT) 0.4 MG SL tablet Place 1 tablet (0.4 mg total) under the tongue every 5 (five) minutes as needed for chest pain.  25 tablet  3    Past Medical History  Diagnosis Date  . PAD (peripheral artery disease)      stent Left common iliac  (patent at cath 2006)  . CAD (coronary artery disease)     cath 2006...moderate 2 vessel....(followed by nuclear-no ischemia) EF  normal...cath...2006... /  echo....2006  normal EF...no significant MR  . Dyslipidemia   . Hypertension   . Hypothyroidism   . History of tobacco abuse     in the past  . Ejection fraction     EF normal, echo, 2006  . Carotid artery disease     Bilateral 60-79% historically.  //   Patient reports  Doppler in Dr. Margaretmary Eddy office December, 2012, showing no significant change.    No past surgical history on file.  History   Social History  . Marital Status: Married    Spouse Name: N/A    Number of Children: N/A  . Years of Education: N/A   Occupational History  . Not on file.   Social History Main Topics  . Smoking status: Former Smoker -- 1.5 packs/day for 40 years   Types: Cigarettes    Quit date: 10/29/2003  . Smokeless tobacco: Never Used  . Alcohol Use: Not on file  . Drug Use: Not on file  . Sexually Active: Not on file   Other Topics Concern  . Not on file   Social History Narrative  . No narrative on file    No family history on file.  ROS: no nausea, vomiting; no fever, chills; no melena, hematochezia; no claudication  PHYSICAL EXAM: BP 119/70  Pulse 57  Ht 5\' 8"  (1.727 m)  Wt 191 lb 6.4 oz (86.818 kg)  BMI 29.10 kg/m2  SpO2 96% GENERAL: 66 year-old male, moderately obese; NAD HEENT: NCAT, PERRLA, EOMI; sclera clear; no xanthelasma NECK: palpable bilateral carotid pulses, no bruits; no JVD; no TM LUNGS: CTA bilaterally CARDIAC: RRR (S1, S2); no significant murmurs; no rubs or gallops ABDOMEN: Protuberant EXTREMETIES: no significant peripheral edema SKIN: warm/dry; no obvious rash/lesions MUSCULOSKELETAL: no joint deformity NEURO: no focal deficit; NL affect   EKG: reviewed and available in Electronic Records   ASSESSMENT & PLAN:  CAD (coronary artery disease) Quiescent on current medication regimen. Will renew prescription for NTG. No current clinical indication for a repeat stress  test. Reassess clinical status in one year, with Dr. Myrtis Ser.  Hypertension Well-controlled on current medication regimen  Dyslipidemia Followed by primary M.D. Last LDL 61, 12/2011. Continue current dose Lipitor.  Carotid artery disease Followed by primary M.D.    Gene Dangela How, PAC

## 2012-12-02 NOTE — Assessment & Plan Note (Signed)
Well-controlled on current medication regimen 

## 2012-12-02 NOTE — Patient Instructions (Signed)
   Nitroglycerin as needed for severe chest pain - refill sent to pharm Continue all other current medications. Your physician wants you to follow up in:  1 year.  You will receive a reminder letter in the mail one-two months in advance.  If you don't receive a letter, please call our office to schedule the follow up appointment

## 2012-12-02 NOTE — Assessment & Plan Note (Signed)
Followed by primary M.D. Last LDL 61, 12/2011. Continue current dose Lipitor.

## 2012-12-02 NOTE — Assessment & Plan Note (Addendum)
Quiescent on current medication regimen. Will renew prescription for NTG. No current clinical indication for a repeat stress test. Reassess clinical status in one year, with Dr. Myrtis Ser.

## 2012-12-14 ENCOUNTER — Ambulatory Visit: Payer: BC Managed Care – PPO | Admitting: Physician Assistant

## 2013-10-16 ENCOUNTER — Encounter: Payer: Self-pay | Admitting: Cardiology

## 2013-10-23 ENCOUNTER — Encounter: Payer: Self-pay | Admitting: Cardiology

## 2013-10-23 ENCOUNTER — Ambulatory Visit (INDEPENDENT_AMBULATORY_CARE_PROVIDER_SITE_OTHER): Payer: BC Managed Care – PPO | Admitting: Cardiology

## 2013-10-23 VITALS — BP 108/67 | HR 52 | Ht 67.0 in | Wt 190.8 lb

## 2013-10-23 DIAGNOSIS — R42 Dizziness and giddiness: Secondary | ICD-10-CM | POA: Insufficient documentation

## 2013-10-23 DIAGNOSIS — I10 Essential (primary) hypertension: Secondary | ICD-10-CM

## 2013-10-23 DIAGNOSIS — I779 Disorder of arteries and arterioles, unspecified: Secondary | ICD-10-CM

## 2013-10-23 DIAGNOSIS — I2510 Atherosclerotic heart disease of native coronary artery without angina pectoris: Secondary | ICD-10-CM

## 2013-10-23 DIAGNOSIS — I739 Peripheral vascular disease, unspecified: Secondary | ICD-10-CM

## 2013-10-23 DIAGNOSIS — R011 Cardiac murmur, unspecified: Secondary | ICD-10-CM

## 2013-10-23 DIAGNOSIS — E039 Hypothyroidism, unspecified: Secondary | ICD-10-CM

## 2013-10-23 DIAGNOSIS — E785 Hyperlipidemia, unspecified: Secondary | ICD-10-CM

## 2013-10-23 NOTE — Assessment & Plan Note (Signed)
He is having some very infrequent dizziness at times when he has been working excessively. I encouraged him to be sure that he is staying hydrated. No further workup.

## 2013-10-23 NOTE — Patient Instructions (Signed)

## 2013-10-23 NOTE — Assessment & Plan Note (Signed)
The patient has known significant PAD. He does not describe claudication at this time. No further workup.

## 2013-10-23 NOTE — Progress Notes (Signed)
HPI  Patient is seen today to followup coronary disease and peripheral arterial disease and dyslipidemia. I saw him last December, 2013. He's actually doing well. I have reviewed Dopplers done at his primary care office. He does have moderate carotid disease is being followed carefully. He's not having any chest pain. He mentions that his toes are cold at night. He has PAD, but he does not appear to have any significant claudication.  He does mention that he is had some mild dizziness. This is very slight and it usually occurs when he has been doing heavy physical work. He has not had syncope or presyncope.  Allergies  Allergen Reactions  . Azithromycin     REACTION: swelling    Current Outpatient Prescriptions  Medication Sig Dispense Refill  . aspirin EC 81 MG tablet Take 81 mg by mouth daily.        Marland Kitchen atorvastatin (LIPITOR) 40 MG tablet Take 40 mg by mouth every morning.       Marland Kitchen levothyroxine (SYNTHROID, LEVOTHROID) 175 MCG tablet Take 175 mcg by mouth daily.        . metoprolol tartrate (LOPRESSOR) 25 MG tablet Take 25 mg by mouth 2 (two) times daily.        . nitroGLYCERIN (NITROSTAT) 0.4 MG SL tablet Place 1 tablet (0.4 mg total) under the tongue every 5 (five) minutes as needed for chest pain.  25 tablet  3  . telmisartan-hydrochlorothiazide (MICARDIS HCT) 80-25 MG per tablet Take 1 tablet by mouth daily.         No current facility-administered medications for this visit.    History   Social History  . Marital Status: Married    Spouse Name: N/A    Number of Children: N/A  . Years of Education: N/A   Occupational History  . Not on file.   Social History Main Topics  . Smoking status: Former Smoker -- 1.50 packs/day for 40 years    Types: Cigarettes    Quit date: 10/29/2003  . Smokeless tobacco: Never Used  . Alcohol Use: Not on file  . Drug Use: Not on file  . Sexual Activity: Not on file   Other Topics Concern  . Not on file   Social History Narrative  .  No narrative on file    No family history on file.  Past Medical History  Diagnosis Date  . PAD (peripheral artery disease)      stent Left common iliac  (patent at cath 2006)  . CAD (coronary artery disease)     cath 2006...moderate 2 vessel....(followed by nuclear-no ischemia) EF  normal...cath...2006... /  echo....2006  normal EF...no significant MR  . Dyslipidemia   . Hypertension   . Hypothyroidism   . History of tobacco abuse     in the past  . Ejection fraction     EF normal, echo, 2006  . Carotid artery disease     Bilateral 60-79% historically.  //   Patient reports  Doppler in Dr. Margaretmary Eddy office December, 2012, showing no significant change.    History reviewed. No pertinent past surgical history.  Patient Active Problem List   Diagnosis Date Noted  . Carotid artery disease   . PAD (peripheral artery disease)   . CAD (coronary artery disease)   . Hypertension   . Hypothyroidism   . History of tobacco abuse   . Ejection fraction   . Dyslipidemia     ROS   Patient denies fever, chills,  headache, sweats, rash, change in vision, change in hearing, chest pain, cough, nausea vomiting, urinary symptoms. All other systems are reviewed and are negative.  PHYSICAL EXAM  Patient is oriented to person time and place. Affect is normal. There is no jugular venous distention. Lungs are clear. Respiratory effort is nonlabored. Cardiac exam reveals an S1 and S2. There no clicks. There is a soft systolic murmur. Abdomen is soft. There is no peripheral edema.  Filed Vitals:   10/23/13 0923  BP: 108/67  Pulse: 52  Height: 5\' 7"  (1.702 m)  Weight: 190 lb 12.8 oz (86.546 kg)  SpO2: 95%     ASSESSMENT & PLAN

## 2013-10-23 NOTE — Assessment & Plan Note (Signed)
I have reviewed his labs. His LDL is excellent. No change in therapy.

## 2013-10-23 NOTE — Assessment & Plan Note (Addendum)
The patient's coronary status is stable. No further workup needed.  As part of today's evaluation I spent greater than 25 minutes with his total care. More than half of this time was spent with direct contact with him. We discussed multiple aspects of his care. He is stable.

## 2013-10-23 NOTE — Assessment & Plan Note (Signed)
Blood pressure is well controlled. No change in therapy. 

## 2013-10-23 NOTE — Assessment & Plan Note (Signed)
He has known carotid disease. This is followed carefully by his primary physician. I reminded him to make his yearly followup with his primary physician for this.

## 2013-10-23 NOTE — Assessment & Plan Note (Signed)
His labs show that his thyroid is being treated appropriately. No change in therapy.

## 2013-10-23 NOTE — Assessment & Plan Note (Signed)
There is a systolic murmur. I suspect aortic valvular disease. I doubt significant aortic stenosis. His last echo was 2006. When I see him next year we will consider a followup 2-D echo.

## 2013-11-02 ENCOUNTER — Other Ambulatory Visit: Payer: Self-pay

## 2013-11-17 ENCOUNTER — Encounter: Payer: Self-pay | Admitting: Cardiology

## 2014-08-06 ENCOUNTER — Encounter: Payer: Self-pay | Admitting: Cardiovascular Disease

## 2014-08-06 ENCOUNTER — Ambulatory Visit (INDEPENDENT_AMBULATORY_CARE_PROVIDER_SITE_OTHER): Payer: Medicare Other | Admitting: Cardiovascular Disease

## 2014-08-06 ENCOUNTER — Telehealth: Payer: Self-pay | Admitting: *Deleted

## 2014-08-06 VITALS — BP 178/96 | HR 69 | Ht 68.0 in | Wt 194.0 lb

## 2014-08-06 DIAGNOSIS — R011 Cardiac murmur, unspecified: Secondary | ICD-10-CM

## 2014-08-06 DIAGNOSIS — I209 Angina pectoris, unspecified: Secondary | ICD-10-CM

## 2014-08-06 DIAGNOSIS — I10 Essential (primary) hypertension: Secondary | ICD-10-CM

## 2014-08-06 DIAGNOSIS — I25119 Atherosclerotic heart disease of native coronary artery with unspecified angina pectoris: Secondary | ICD-10-CM

## 2014-08-06 DIAGNOSIS — I2510 Atherosclerotic heart disease of native coronary artery without angina pectoris: Secondary | ICD-10-CM

## 2014-08-06 DIAGNOSIS — E785 Hyperlipidemia, unspecified: Secondary | ICD-10-CM

## 2014-08-06 DIAGNOSIS — I739 Peripheral vascular disease, unspecified: Secondary | ICD-10-CM

## 2014-08-06 MED ORDER — NITROGLYCERIN 0.4 MG SL SUBL: 0.4000 mg | SUBLINGUAL_TABLET | SUBLINGUAL | Status: DC | PRN

## 2014-08-06 MED ORDER — AMLODIPINE BESYLATE 5 MG PO TABS: 5.0000 mg | ORAL_TABLET | Freq: Every day | ORAL | Status: DC

## 2014-08-06 NOTE — Progress Notes (Signed)
Patient ID: Christopher Randolph, male   DOB: 12-23-1946, 68 y.o.   MRN: [id number]      SUBJECTIVE: The patient is a 67 year old man who normally sees Dr. Ron Parker. He has a history of coronary artery disease, peripheral arterial disease, essential hypertension, and hyperlipidemia. He has been scheduled to see me today for labile blood pressures. His blood pressures have been assessed at home at 190/95 and 180/91. It was associated with some lightheadedness and some left sided facial tingling, as well as a headache. He denies visual disturbances. He also complains of gradual weakness over the past two weeks. He has had some shortness of breath with exertion and some minor chest pains not requiring the use of nitroglycerin.    Review of Systems: As per "subjective", otherwise negative.  Allergies  Allergen Reactions  . Azithromycin     REACTION: swelling    Current Outpatient Prescriptions  Medication Sig Dispense Refill  . aspirin EC 81 MG tablet Take 81 mg by mouth daily.        Marland Kitchen atorvastatin (LIPITOR) 40 MG tablet Take 40 mg by mouth every morning.       Marland Kitchen levothyroxine (SYNTHROID, LEVOTHROID) 175 MCG tablet Take 175 mcg by mouth daily.        . metoprolol tartrate (LOPRESSOR) 25 MG tablet Take 25 mg by mouth 2 (two) times daily.        . nitroGLYCERIN (NITROSTAT) 0.4 MG SL tablet Place 1 tablet (0.4 mg total) under the tongue every 5 (five) minutes as needed for chest pain.  25 tablet  3  . telmisartan-hydrochlorothiazide (MICARDIS HCT) 80-25 MG per tablet Take 1 tablet by mouth daily.         No current facility-administered medications for this visit.    Past Medical History  Diagnosis Date  . PAD (peripheral artery disease)      stent Left common iliac  (patent at cath 2006)  . CAD (coronary artery disease)     cath 2006...moderate 2 vessel....(followed by nuclear-no ischemia) EF  normal...cath...2006... /  echo....2006  normal EF...no significant MR  . Dyslipidemia   .  Hypertension   . Hypothyroidism   . History of tobacco abuse     in the past  . Ejection fraction     EF normal, echo, 2006  . Carotid artery disease     Bilateral 60-79% historically.  //   Patient reports  Doppler in Dr. Trena Platt office December, 2012, showing no significant change.  . Systolic murmur   . Dizziness     No past surgical history on file.  History   Social History  . Marital Status: Married    Spouse Name: N/A    Number of Children: N/A  . Years of Education: N/A   Occupational History  . Not on file.   Social History Main Topics  . Smoking status: Former Smoker -- 1.50 packs/day for 40 years    Types: Cigarettes    Quit date: 10/29/2003  . Smokeless tobacco: Never Used  . Alcohol Use: Not on file  . Drug Use: Not on file  . Sexual Activity: Not on file   Other Topics Concern  . Not on file   Social History Narrative  . No narrative on file     Filed Vitals:   08/06/14 1003  BP: 178/96  Pulse: 69  Height: 5\' 8"  (1.727 m)  Weight: 194 lb (87.998 kg)    PHYSICAL EXAM General: NAD Neck: No JVD, no  thyromegaly. Lungs: Clear to auscultation bilaterally with normal respiratory effort. CV: Nondisplaced PMI.  Regular rate and rhythm, normal S1/S2, no S3/S4, soft I/VI systolic murmur over RUSB.   Abdomen: Soft, nontender, no hepatosplenomegaly, no distention.  Neurologic: Alert and oriented x 3.  Psych: Normal affect. Extremities: No clubbing or cyanosis.   ECG: reviewed and available in electronic records.      ASSESSMENT AND PLAN: 1. Accelerated essential HTN: He is taking telmisartan-hydrochlorothiazide 80-25 mg daily. Doses of telmisartan greater than 80 mg daily have not shown to significantly reduce blood pressure. He is already taking Toprol 25 mg twice daily. I will start amlodipine 5 mg daily.  2. CAD: Appears to be symptomatically stable, with most symptoms likely arising from accelerated HTN. Add Norvasc as above.  Dispo: f/u Arnold Long NP in one week.  Kate Sable, M.D., F.A.C.C.

## 2014-08-06 NOTE — Patient Instructions (Signed)
Your physician recommends that you schedule a follow-up appointment in: 1 week with Jory Sims, NP  Your physician has recommended you make the following change in your medication:   Start Norvasc 5 mg daily

## 2014-08-06 NOTE — Telephone Encounter (Signed)
Spoke with patient and he C/O high BP's ranging around 190/95 & 180/91, lightheadedness, a small amount tingling on left side of face and gradual weakness times 2 weeks. Patient also c/o Sob with exertion with some minor chest pain. Patient said that the chest pain has not been bad enough to use his nitroglycerin. Patient is requesting to be seen today and is aware that Dr. Ron Parker has no availability. Patient given an appointment at the [facility] office to see Dr. Bronson Ing this morning at 10:00 am.

## 2014-08-13 ENCOUNTER — Ambulatory Visit (INDEPENDENT_AMBULATORY_CARE_PROVIDER_SITE_OTHER): Payer: Medicare Other | Admitting: Physician Assistant

## 2014-08-13 ENCOUNTER — Encounter: Payer: Self-pay | Admitting: Physician Assistant

## 2014-08-13 VITALS — BP 118/68 | HR 50 | Ht 69.0 in | Wt 193.0 lb

## 2014-08-13 DIAGNOSIS — I779 Disorder of arteries and arterioles, unspecified: Secondary | ICD-10-CM

## 2014-08-13 DIAGNOSIS — I2510 Atherosclerotic heart disease of native coronary artery without angina pectoris: Secondary | ICD-10-CM

## 2014-08-13 DIAGNOSIS — R42 Dizziness and giddiness: Secondary | ICD-10-CM

## 2014-08-13 DIAGNOSIS — R079 Chest pain, unspecified: Secondary | ICD-10-CM

## 2014-08-13 DIAGNOSIS — I25119 Atherosclerotic heart disease of native coronary artery with unspecified angina pectoris: Secondary | ICD-10-CM

## 2014-08-13 DIAGNOSIS — R011 Cardiac murmur, unspecified: Secondary | ICD-10-CM

## 2014-08-13 DIAGNOSIS — I10 Essential (primary) hypertension: Secondary | ICD-10-CM

## 2014-08-13 DIAGNOSIS — I739 Peripheral vascular disease, unspecified: Secondary | ICD-10-CM

## 2014-08-13 DIAGNOSIS — I209 Angina pectoris, unspecified: Secondary | ICD-10-CM

## 2014-08-13 MED ORDER — AMLODIPINE BESYLATE 2.5 MG PO TABS: 2.5000 mg | ORAL_TABLET | Freq: Every day | ORAL | Status: DC

## 2014-08-13 NOTE — Assessment & Plan Note (Signed)
Patient has history of CAD and has had recent increase in anginal symptoms with activity. He has not had a nuclear study in many years. Check stress Myoview to rule out ischemia.

## 2014-08-13 NOTE — Patient Instructions (Addendum)
Your physician recommends that you schedule a follow-up appointment in:1 month with Dr.Katz or Dr.Koneswaran in [place]  Please decrease your Norvasc to 2.5 mg daily   Your physician has requested that you have an echocardiogram. Echocardiography is a painless test that uses sound waves to create images of your heart. It provides your doctor with information about the size and shape of your heart and how well your heart's chambers and valves are working. This procedure takes approximately one hour. There are no restrictions for this procedure.   Your physician has requested that you have en exercise stress myoview. For further information please visit [email or web address]. Please follow instruction sheet, as given.HOLD METOPROLOL THE AM OF THE TEST   Please follow 2 gram sodium diet I have provided you

## 2014-08-13 NOTE — Assessment & Plan Note (Addendum)
Patient's blood pressure is good today on Norvasc but the patient is having significant dizziness. He also has elevated blood pressures throughout the day. We'll decrease Norvasc to 2.5 mg daily. 2 g sodium diet. Continue to monitor blood pressures and bring the list back with him on followup. Followup with Dr. Ron Parker in the [place] office in 3 weeks. Check 2-D echo

## 2014-08-13 NOTE — Assessment & Plan Note (Signed)
Patient is due for carotid Dopplers in December

## 2014-08-13 NOTE — Progress Notes (Signed)
HPI: This is a 68 year old male patient of Dr. Ron Parker who was seen by Dr.Koneswaran last week for accelerated hypertension. Norvasc 5 mg once daily was added and he returns today for followup. He also takes Telmisartan/hydrochlorothiazide 80/25 mg daily. He also has history of CAD with moderate 2 vessel disease on cath in 2006 followed by nuclear no ischemia EF normal. He also has peripheral vascular disease(stent in iliac) and hyperlipidemia.  Patient comes in today complaining of ongoing fluctuation of blood pressure. He says his blood pressure is fine in the morning and evening but goes up in the middle of the day. He does admit to getting salt in his diet. He eats canned hotdogs everyday for lunch and they go out for [place] and fried seafood weekly. He also complains of dizziness since the Norvasc was started. He gets dizzy if he bends over and pick something up. He is also complaining of chest pressure which has worsened with activity such as mowing lawn or walking up steps. He's had to stop what he is doing for it to ease. He also complains of dyspnea on exertion. He has a chronic tingling on the left side of his face as well.   Allergies  Allergen Reactions  . Azithromycin     REACTION: swelling     Current Outpatient Prescriptions  Medication Sig Dispense Refill  . amLODipine (NORVASC) 5 MG tablet Take 1 tablet (5 mg total) by mouth daily.  30 tablet  6  . aspirin EC 81 MG tablet Take 81 mg by mouth daily.        Marland Kitchen atorvastatin (LIPITOR) 40 MG tablet Take 40 mg by mouth every morning.       Marland Kitchen levothyroxine (SYNTHROID, LEVOTHROID) 175 MCG tablet Take 175 mcg by mouth daily.        . metoprolol tartrate (LOPRESSOR) 25 MG tablet Take 25 mg by mouth 2 (two) times daily.        . nitroGLYCERIN (NITROSTAT) 0.4 MG SL tablet Place 1 tablet (0.4 mg total) under the tongue every 5 (five) minutes as needed for chest pain.  25 tablet  3  . telmisartan-hydrochlorothiazide (MICARDIS HCT)  80-25 MG per tablet Take 1 tablet by mouth daily.         No current facility-administered medications for this visit.    Past Medical History  Diagnosis Date  . PAD (peripheral artery disease)      stent Left common iliac  (patent at cath 2006)  . CAD (coronary artery disease)     cath 2006...moderate 2 vessel....(followed by nuclear-no ischemia) EF  normal...cath...2006... /  echo....2006  normal EF...no significant MR  . Dyslipidemia   . Hypertension   . Hypothyroidism   . History of tobacco abuse     in the past  . Ejection fraction     EF normal, echo, 2006  . Carotid artery disease     Bilateral 60-79% historically.  //   Patient reports  Doppler in Dr. Trena Platt office December, 2012, showing no significant change.  . Systolic murmur   . Dizziness     No past surgical history on file.  No family history on file.  History   Social History  . Marital Status: Married    Spouse Name: N/A    Number of Children: N/A  . Years of Education: N/A   Occupational History  . Not on file.   Social History Main Topics  . Smoking status: Former Smoker --  1.50 packs/day for 40 years    Types: Cigarettes    Quit date: 10/29/2003  . Smokeless tobacco: Never Used  . Alcohol Use: Not on file  . Drug Use: Not on file  . Sexual Activity: Not on file   Other Topics Concern  . Not on file   Social History Narrative  . No narrative on file    ROS: See history of present illness otherwise negative  BP 118/72  Pulse 63  Ht 5\' 9"  (1.753 m)  Wt 193 lb (87.544 kg)  BMI 28.49 kg/m2  SpO2 95%    PHYSICAL EXAM: Well-nournished, in no acute distress. Neck: Bilateral carotid bruits, No JVD, HJR,  or thyroid enlargement  Lungs: Decreased breath sounds but No tachypnea, clear without wheezing, rales, or rhonchi  Cardiovascular: RRR, PMI not displaced, 2/6 systolic murmur at the left sternal border and apex, positive S4, no bruit, thrill, or heave.  Abdomen: BS normal. Soft  without organomegaly, masses, lesions or tenderness.  Extremities: without cyanosis, clubbing or edema. Good distal pulses bilateral  SKin: Warm, no lesions or rashes   Musculoskeletal: No deformities  Neuro: no focal signs   Wt Readings from Last 3 Encounters:  08/06/14 194 lb (87.998 kg)  10/23/13 190 lb 12.8 oz (86.546 kg)  12/02/12 191 lb 6.4 oz (86.818 kg)     EKG: Sinus bradycardia 55 beats per minute with nonspecific ST-T wave changes  Stress nuclear study in 2006 IMPRESSION:    Normal exam without evidence of pharmacologically induced myocardial ischemia. The calculated left ventricular ejection fraction is 64 percent.      Cardiac catheterization 2006  IMPRESSION:  1.  Normal left ventricular systolic function with question of mitral      regurgitation.  2.  Atherosclerosis of the abdominal aorta with a patent stent in the left      iliac artery.  3.  Two vessel coronary artery disease characterized by stenoses of      borderline severity involving a large obtuse marginal branch and a small      to normal size diagonal branch. There is moderate but nonobstructive      disease in the right coronary artery.

## 2014-08-13 NOTE — Assessment & Plan Note (Signed)
Check 2D echo 

## 2014-08-13 NOTE — Assessment & Plan Note (Addendum)
Patient is not orthostatic in the office, but his dizziness has gotten much worse with the addition of Norvasc. Decrease Norvasc to 2.5 mg daily

## 2014-08-22 ENCOUNTER — Encounter (HOSPITAL_COMMUNITY): Payer: Self-pay

## 2014-08-22 ENCOUNTER — Ambulatory Visit (HOSPITAL_COMMUNITY)
Admission: RE | Admit: 2014-08-22 | Discharge: 2014-08-22 | Disposition: A | Discharge status: Home / Self Care | Payer: Medicare Other | Source: Ambulatory Visit | Attending: Physician Assistant | Admitting: Physician Assistant

## 2014-08-22 ENCOUNTER — Encounter (HOSPITAL_COMMUNITY)
Admission: RE | Admit: 2014-08-22 | Discharge: 2014-08-22 | Disposition: A | Discharge status: Home / Self Care | Payer: Medicare Other | Source: Ambulatory Visit | Attending: Physician Assistant | Admitting: Physician Assistant

## 2014-08-22 DIAGNOSIS — I517 Cardiomegaly: Secondary | ICD-10-CM

## 2014-08-22 DIAGNOSIS — R079 Chest pain, unspecified: Secondary | ICD-10-CM | POA: Insufficient documentation

## 2014-08-22 DIAGNOSIS — I359 Nonrheumatic aortic valve disorder, unspecified: Secondary | ICD-10-CM | POA: Insufficient documentation

## 2014-08-22 DIAGNOSIS — I2510 Atherosclerotic heart disease of native coronary artery without angina pectoris: Secondary | ICD-10-CM | POA: Insufficient documentation

## 2014-08-22 DIAGNOSIS — R42 Dizziness and giddiness: Secondary | ICD-10-CM | POA: Insufficient documentation

## 2014-08-22 DIAGNOSIS — R072 Precordial pain: Secondary | ICD-10-CM | POA: Diagnosis present

## 2014-08-22 DIAGNOSIS — E785 Hyperlipidemia, unspecified: Secondary | ICD-10-CM | POA: Diagnosis not present

## 2014-08-22 DIAGNOSIS — I10 Essential (primary) hypertension: Secondary | ICD-10-CM | POA: Insufficient documentation

## 2014-08-22 MED ORDER — REGADENOSON 0.4 MG/5ML IV SOLN
INTRAVENOUS | Status: AC
  Administered 2014-08-22: 0.4 mg via INTRAVENOUS
  Filled 2014-08-22: qty 5

## 2014-08-22 MED ORDER — TECHNETIUM TC 99M SESTAMIBI GENERIC - CARDIOLITE
10.0000 | Freq: Once | INTRAVENOUS | Status: AC | PRN
  Administered 2014-08-22: 10 via INTRAVENOUS

## 2014-08-22 MED ORDER — TECHNETIUM TC 99M SESTAMIBI - CARDIOLITE
30.0000 | Freq: Once | INTRAVENOUS | Status: AC | PRN
  Administered 2014-08-22: 30 via INTRAVENOUS

## 2014-08-22 MED ORDER — REGADENOSON 0.4 MG/5ML IV SOLN
0.4000 mg | Freq: Once | INTRAVENOUS | Status: AC | PRN
  Administered 2014-08-22: 0.4 mg via INTRAVENOUS

## 2014-08-22 MED ORDER — SODIUM CHLORIDE 0.9 % IJ SOLN
INTRAMUSCULAR | Status: DC
Start: 2014-08-22 — End: 2014-08-28
  Filled 2014-08-22: qty 36

## 2014-08-22 MED ORDER — SODIUM CHLORIDE 0.9 % IJ SOLN
INTRAMUSCULAR | Status: AC
  Administered 2014-08-22: 10 mL via INTRAVENOUS
  Filled 2014-08-22: qty 10

## 2014-08-22 MED ORDER — SODIUM CHLORIDE 0.9 % IJ SOLN
10.0000 mL | INTRAMUSCULAR | Status: DC | PRN
  Administered 2014-08-22: 10 mL via INTRAVENOUS

## 2014-08-22 NOTE — Progress Notes (Signed)
  Echocardiogram 2D Echocardiogram has been performed.  [place], [place] 08/22/2014, 9:25 AM

## 2014-08-22 NOTE — Progress Notes (Signed)
Stress Lab Nurses Notes - Christopher Randolph  Christopher Randolph 08/22/2014 Reason for doing test: CAD and dizziness & HTN Type of test: Test Changed unable to reach THR, having dizziness, lexiscan cardiolite given Nurse performing test: Gerrit Halls, RN Nuclear Medicine Tech: Melburn Hake Echo Tech: Not Applicable MD performing test: Koneswaran/M.Bonnell Public PA Family MD: Manuella Ghazi Test explained and consent signed: Yes.   IV started: No redness or edema and Saline lock started in radiology Symptoms:SOB, dizziness & chest pressure Treatment/Intervention: None Reason test stopped: protocol completed After recovery IV was: Discontinued via X-ray tech and No redness or edema Patient to return to [place]. Med at : 12;15 Patient discharged: Home Patient's Condition upon discharge was: stable Comments: During test BP 155/65 & HR 105.  Recovery BP 122/66 & HR 72.  Symptoms resolved in recovery.  Having dizziness prior to test and continues to have dizziness. Christopher Randolph

## 2014-09-07 ENCOUNTER — Encounter: Payer: Self-pay | Admitting: Cardiovascular Disease

## 2014-09-07 ENCOUNTER — Ambulatory Visit (INDEPENDENT_AMBULATORY_CARE_PROVIDER_SITE_OTHER): Payer: Medicare Other | Admitting: Cardiology

## 2014-09-07 VITALS — BP 118/76 | HR 56 | Ht 68.0 in | Wt 197.0 lb

## 2014-09-07 DIAGNOSIS — R42 Dizziness and giddiness: Secondary | ICD-10-CM

## 2014-09-07 DIAGNOSIS — I779 Disorder of arteries and arterioles, unspecified: Secondary | ICD-10-CM

## 2014-09-07 DIAGNOSIS — E785 Hyperlipidemia, unspecified: Secondary | ICD-10-CM

## 2014-09-07 DIAGNOSIS — R9430 Abnormal result of cardiovascular function study, unspecified: Secondary | ICD-10-CM

## 2014-09-07 DIAGNOSIS — I10 Essential (primary) hypertension: Secondary | ICD-10-CM

## 2014-09-07 DIAGNOSIS — I739 Peripheral vascular disease, unspecified: Secondary | ICD-10-CM

## 2014-09-07 DIAGNOSIS — R011 Cardiac murmur, unspecified: Secondary | ICD-10-CM

## 2014-09-07 DIAGNOSIS — I2510 Atherosclerotic heart disease of native coronary artery without angina pectoris: Secondary | ICD-10-CM

## 2014-09-07 DIAGNOSIS — R0989 Other specified symptoms and signs involving the circulatory and respiratory systems: Secondary | ICD-10-CM

## 2014-09-07 NOTE — Assessment & Plan Note (Signed)
The patient had a stent to his left common iliac in the past. He was patent it cath in 2006. He now has weakness with walking. He feels this in his legs. He thinks it feels like his original symptom before his stent. We will proceed with arranging arterial Dopplers of his legs. Then I will be seeing him back.  As part of today's evaluation I spent greater than 25 minutes with the patient. More than half of this time is been spent directly with him. We reviewed his echo and his nuclear scan. We talked about all of his symptoms. We talked about his leg weakness and made plans for this.

## 2014-09-07 NOTE — Assessment & Plan Note (Signed)
His lipids are being treated appropriately. No change in therapy.

## 2014-09-07 NOTE — Assessment & Plan Note (Signed)
LV function is good both by echo and nuclear study this year. No further workup.

## 2014-09-07 NOTE — Progress Notes (Signed)
Patient ID: Christopher Randolph, male   DOB: [birth year], 68 y.o.   MRN: [id number]    HPI  Patient is seen today to followup coronary disease and PAD and hypertension. I saw him last October, 2014. Since that time he has also been seen by other members of our team. Amlodipine was started for his blood pressure. This treated as pressure but he did not feel well. The dose was decreased and now is quite stable. There was also some concern about some chest discomfort. After he was seen in August, 2015 he had an echo. Ejection fraction was 60-65%. There was aortic valve sclerosis. Nuclear stress study showed no ischemia. As of today he is feeling relatively well.  He does mention that he has leg weakness with walking. He mentions that this was his symptom before he required stenting in the past.  Allergies  Allergen Reactions  . Azithromycin     REACTION: swelling    Current Outpatient Prescriptions  Medication Sig Dispense Refill  . amLODipine (NORVASC) 2.5 MG tablet Take 1 tablet (2.5 mg total) by mouth daily.  30 tablet  6  . aspirin EC 81 MG tablet Take 81 mg by mouth daily.        Marland Kitchen atorvastatin (LIPITOR) 40 MG tablet Take 40 mg by mouth every morning.       Marland Kitchen levothyroxine (SYNTHROID, LEVOTHROID) 175 MCG tablet Take 175 mcg by mouth daily.        . metoprolol tartrate (LOPRESSOR) 25 MG tablet Take 25 mg by mouth 2 (two) times daily.        . nitroGLYCERIN (NITROSTAT) 0.4 MG SL tablet Place 1 tablet (0.4 mg total) under the tongue every 5 (five) minutes as needed for chest pain.  25 tablet  3  . telmisartan-hydrochlorothiazide (MICARDIS HCT) 80-25 MG per tablet Take 1 tablet by mouth daily.         No current facility-administered medications for this visit.    History   Social History  . Marital Status: Married    Spouse Name: N/A    Number of Children: N/A  . Years of Education: N/A   Occupational History  . Not on file.   Social History Main Topics  . Smoking status: Former  Smoker -- 1.50 packs/day for 40 years    Types: Cigarettes    Quit date: 10/29/2003  . Smokeless tobacco: Never Used  . Alcohol Use: Not on file  . Drug Use: Not on file  . Sexual Activity: Not on file   Other Topics Concern  . Not on file   Social History Narrative  . No narrative on file    No family history on file.  Past Medical History  Diagnosis Date  . PAD (peripheral artery disease)      stent Left common iliac  (patent at cath 2006)  . CAD (coronary artery disease)     cath 2006...moderate 2 vessel....(followed by nuclear-no ischemia) EF  normal...cath...2006... /  echo....2006  normal EF...no significant MR  . Dyslipidemia   . Hypertension   . Hypothyroidism   . History of tobacco abuse     in the past  . Ejection fraction     EF normal, echo, 2006  . Carotid artery disease     Bilateral 60-79% historically.  //   Patient reports  Doppler in Dr. Trena Platt office December, 2012, showing no significant change.  . Systolic murmur   . Dizziness     History reviewed. No pertinent  past surgical history.  Patient Active Problem List   Diagnosis Date Noted  . Systolic murmur   . Dizziness   . Carotid artery disease   . PAD (peripheral artery disease)   . CAD (coronary artery disease)   . Hypertension   . Hypothyroidism   . History of tobacco abuse   . Ejection fraction   . Dyslipidemia     ROS   Patient denies fever, chills, headache, sweats, rash, change in vision, change in hearing, chest pain, cough, nausea vomiting, urinary symptoms. All other systems are reviewed and are negative.  PHYSICAL EXAM  Patient is oriented to person time and place. Affect is normal. Head is atraumatic. Sclera and conjunctiva are normal. There is no jugulovenous distention. Lungs are clear. Respiratory effort is nonlabored. Cardiac exam reveals S1 and S2. The abdomen is soft. There is no peripheral edema. There no musculoskeletal deformities. There are no skin rashes.  Filed  Vitals:   09/07/14 1019  BP: 118/76  Pulse: 56  Height: 5\' 8"  (1.727 m)  Weight: 197 lb (89.359 kg)  SpO2: 94%     ASSESSMENT & PLAN

## 2014-09-07 NOTE — Assessment & Plan Note (Signed)
We now seem to have him on a good regimen for his blood pressure. Today he brought a listing of his blood pressures at home. I have reviewed all of it with him. His pressure is ranging approximately 135/75. This is stable for him. No further workup.

## 2014-09-07 NOTE — Assessment & Plan Note (Signed)
Coronary disease is stable. His nuclear study in August, 2015 revealed no ischemia. He had diaphragmatic attenuation. Ejection fraction was greater than 70%. No further workup at this time.

## 2014-09-07 NOTE — Assessment & Plan Note (Signed)
Most recently he has not had any significant dizziness. No further workup.

## 2014-09-07 NOTE — Patient Instructions (Signed)
Lower extremity arterial doppler  Office will contact with results via phone or letter.   Continue all current medications. Follow up in  3 months

## 2014-09-07 NOTE — Assessment & Plan Note (Signed)
The patient's carotid historically her followed in his primary care office. He knows to ask Dr. Manuella Ghazi about this.

## 2014-09-10 ENCOUNTER — Encounter: Payer: Self-pay | Admitting: Cardiology

## 2014-09-13 ENCOUNTER — Ambulatory Visit (INDEPENDENT_AMBULATORY_CARE_PROVIDER_SITE_OTHER): Payer: Medicare Other | Admitting: Cardiology

## 2014-09-13 DIAGNOSIS — I739 Peripheral vascular disease, unspecified: Secondary | ICD-10-CM

## 2014-09-13 NOTE — Progress Notes (Signed)
Lower extremity arterial Doppler performed. 

## 2014-09-18 ENCOUNTER — Encounter: Payer: Self-pay | Admitting: Cardiology

## 2014-09-19 ENCOUNTER — Encounter: Payer: Self-pay | Admitting: Cardiology

## 2014-09-19 ENCOUNTER — Encounter: Payer: Self-pay | Admitting: *Deleted

## 2014-12-10 ENCOUNTER — Encounter: Payer: Self-pay | Admitting: Cardiology

## 2014-12-10 ENCOUNTER — Ambulatory Visit (INDEPENDENT_AMBULATORY_CARE_PROVIDER_SITE_OTHER): Payer: Medicare Other | Admitting: Cardiology

## 2014-12-10 VITALS — BP 106/65 | HR 56 | Ht 68.0 in | Wt 187.0 lb

## 2014-12-10 DIAGNOSIS — I739 Peripheral vascular disease, unspecified: Secondary | ICD-10-CM

## 2014-12-10 DIAGNOSIS — I2510 Atherosclerotic heart disease of native coronary artery without angina pectoris: Secondary | ICD-10-CM

## 2014-12-10 NOTE — Progress Notes (Signed)
Patient ID: Christopher Randolph, male   DOB: 22-Oct-1946, 68 y.o.   MRN: [id number]    HPI Patient returns today to follow up coronary disease and PAD. He had a stent placed in his leg in the past. He had symptoms recently and we proceeded with arterial Dopplers. These showed no significant abnormalities. I have reassured him today.  Allergies  Allergen Reactions  . Azithromycin     REACTION: swelling    Current Outpatient Prescriptions  Medication Sig Dispense Refill  . amLODipine (NORVASC) 2.5 MG tablet Take 1 tablet (2.5 mg total) by mouth daily. 30 tablet 6  . aspirin EC 81 MG tablet Take 81 mg by mouth daily.      Marland Kitchen atorvastatin (LIPITOR) 40 MG tablet Take 40 mg by mouth every morning.     Marland Kitchen levothyroxine (SYNTHROID, LEVOTHROID) 175 MCG tablet Take 175 mcg by mouth daily.      . metFORMIN (GLUCOPHAGE) 500 MG tablet Take 1 tablet by mouth daily.    . metoprolol tartrate (LOPRESSOR) 25 MG tablet Take 25 mg by mouth 2 (two) times daily.      . nitroGLYCERIN (NITROSTAT) 0.4 MG SL tablet Place 1 tablet (0.4 mg total) under the tongue every 5 (five) minutes as needed for chest pain. 25 tablet 3  . telmisartan-hydrochlorothiazide (MICARDIS HCT) 80-25 MG per tablet Take 1 tablet by mouth daily.       No current facility-administered medications for this visit.    History   Social History  . Marital Status: Married    Spouse Name: N/A    Number of Children: N/A  . Years of Education: N/A   Occupational History  . Not on file.   Social History Main Topics  . Smoking status: Former Smoker -- 1.50 packs/day for 40 years    Types: Cigarettes    Start date: 11/04/1963    Quit date: 10/29/2003  . Smokeless tobacco: Never Used  . Alcohol Use: Not on file  . Drug Use: Not on file  . Sexual Activity: Not on file   Other Topics Concern  . Not on file   Social History Narrative    History reviewed. No pertinent family history.  Past Medical History  Diagnosis Date  . PAD  (peripheral artery disease)      stent Left common iliac  (patent at cath 2006)  . CAD (coronary artery disease)     cath 2006...moderate 2 vessel....(followed by nuclear-no ischemia) EF  normal...cath...2006... /  echo....2006  normal EF...no significant MR  . Dyslipidemia   . Hypertension   . Hypothyroidism   . History of tobacco abuse     in the past  . Ejection fraction     EF normal, echo, 2006  . Carotid artery disease     Bilateral 60-79% historically.  //   Patient reports  Doppler in Dr. Trena Platt office December, 2012, showing no significant change.  . Systolic murmur   . Dizziness     History reviewed. No pertinent past surgical history.  Patient Active Problem List   Diagnosis Date Noted  . Systolic murmur   . Dizziness   . Carotid artery disease   . PAD (peripheral artery disease)   . CAD (coronary artery disease)   . Hypertension   . Hypothyroidism   . History of tobacco abuse   . Ejection fraction   . Dyslipidemia     ROS  Patient denies fever, chills, headache, sweats, rash, change in vision, change in hearing,  chest pain, cough, nausea or vomiting, urinary symptoms. All other systems are reviewed and are negative.  PHYSICAL EXAM Patient is oriented to person time and place. Affect is normal. Head is atraumatic. Sclera and conjunctiva are normal. There is no jugular venous distention. Lungs are clear. Respiratory effort is nonlabored. Cardiac exam reveals S1 and S2. Abdomen is soft. There is no peripheral edema.  Filed Vitals:   12/10/14 0759  BP: 106/65  Pulse: 56  Height: 5\' 8"  (1.727 m)  Weight: 187 lb (84.823 kg)     ASSESSMENT & PLAN

## 2014-12-10 NOTE — Assessment & Plan Note (Signed)
Coronary disease is stable. No further workup.  I informed the patient that I will be retiring next September. He requested to see Dr. Domenic Polite for his one-year follow-up as he is seen him in the past.

## 2014-12-10 NOTE — Patient Instructions (Signed)
Your physician recommends that you schedule a follow-up appointment in: 1 year with Dr. McDowell. You will receive a reminder letter in the mail in about 10 months reminding you to call and schedule your appointment. If you don't receive this letter, please contact our office. Your physician recommends that you continue on your current medications as directed. Please refer to the Current Medication list given to you today. 

## 2014-12-10 NOTE — Assessment & Plan Note (Signed)
His Dopplers revealed no significant evidence of worsening obstructive disease in his legs. He is encouraged to exercise regularly.

## 2015-12-11 ENCOUNTER — Ambulatory Visit (INDEPENDENT_AMBULATORY_CARE_PROVIDER_SITE_OTHER): Payer: Medicare Other | Admitting: Cardiology

## 2015-12-11 ENCOUNTER — Encounter: Payer: Self-pay | Admitting: Cardiology

## 2015-12-11 ENCOUNTER — Encounter: Payer: Self-pay | Admitting: *Deleted

## 2015-12-11 VITALS — BP 122/70 | HR 55 | Ht 68.0 in | Wt 187.0 lb

## 2015-12-11 DIAGNOSIS — I779 Disorder of arteries and arterioles, unspecified: Secondary | ICD-10-CM

## 2015-12-11 DIAGNOSIS — E785 Hyperlipidemia, unspecified: Secondary | ICD-10-CM

## 2015-12-11 DIAGNOSIS — I739 Peripheral vascular disease, unspecified: Secondary | ICD-10-CM

## 2015-12-11 DIAGNOSIS — I10 Essential (primary) hypertension: Secondary | ICD-10-CM

## 2015-12-11 DIAGNOSIS — I2510 Atherosclerotic heart disease of native coronary artery without angina pectoris: Secondary | ICD-10-CM

## 2015-12-11 MED ORDER — NITROGLYCERIN 0.4 MG SL SUBL: 0.4000 mg | SUBLINGUAL_TABLET | SUBLINGUAL | Status: DC | PRN

## 2015-12-11 NOTE — Progress Notes (Signed)
Cardiology Office Note  Date: 12/11/2015   ID: JAYDEN SHULTZ, DOB [birth year], MRN [id number]  PCP: Monico Blitz, MD  Primary Cardiologist: Rozann Lesches, MD   Chief Complaint  Patient presents with  . Coronary Artery Disease  . PAD    History of Present Illness: Christopher Randolph is a 69 y.o. male former patient of Dr. Ron Parker now establishing with me in the office. This is our first meeting today. I reviewed his records and updated the chart. He was last seen by Dr. Ron Parker in September 2015.  He presents with no complaints of angina or significant nitroglycerin use. He reports compliance with his medications. He has been retired for about 5 years now from [employer]. He enjoys woodworking, has made several things for family members this Christmas.  Cardiac testing from August of last year is outlined below including Cardiolite and echocardiogram.  He has a previous history of left common iliac artery stenting in 2004 by Dr. Albertine Patricia. Lower extremity arterial Dopplers were normal in September 2015, normal ABIs. Does not report any claudication symptoms. He does have lumbar back pain which extends to his legs, but has pain injections for this by his orthopedic physician in [place].  As far as follow-up carotid artery testing, I did locate Doppler studies done at [facility] back in 2013 showing 50-69% RICA stenosis and [id number] LICA stenosis. He tells me that he does follow on a yearly basis for repeat studies.  Lipids are also followed at [facility]. He reports no intolerances with Lipitor.  Past Medical History  Diagnosis Date  . PAD (peripheral artery disease) ([place])     Left common iliac stent 2004  . CAD (coronary artery disease)     Branch vessel and moderate RCA disease 2006  . Hyperlipidemia   . Essential hypertension   . Hypothyroidism   . Carotid artery disease ([place])     History reviewed. No pertinent past surgical history.  Current  Outpatient Prescriptions  Medication Sig Dispense Refill  . amLODipine (NORVASC) 2.5 MG tablet Take 1 tablet (2.5 mg total) by mouth daily. 30 tablet 6  . aspirin EC 81 MG tablet Take 81 mg by mouth daily.      Marland Kitchen atorvastatin (LIPITOR) 40 MG tablet Take 40 mg by mouth every morning.     Marland Kitchen levothyroxine (SYNTHROID, LEVOTHROID) 175 MCG tablet Take 175 mcg by mouth daily.      . metFORMIN (GLUCOPHAGE) 500 MG tablet Take 1 tablet by mouth daily.    . metoprolol tartrate (LOPRESSOR) 25 MG tablet Take 25 mg by mouth 2 (two) times daily.      Marland Kitchen telmisartan-hydrochlorothiazide (MICARDIS HCT) 80-25 MG per tablet Take 1 tablet by mouth daily.      . nitroGLYCERIN (NITROSTAT) 0.4 MG SL tablet Place 1 tablet (0.4 mg total) under the tongue every 5 (five) minutes as needed for chest pain. 25 tablet 3   No current facility-administered medications for this visit.   Allergies:  Azithromycin   Social History: The patient  reports that he quit smoking about 12 years ago. His smoking use included Cigarettes. He started smoking about 52 years ago. He has a 60 pack-year smoking history. He has never used smokeless tobacco. He reports that he does not drink alcohol.   ROS:  Please see the history of present illness. Otherwise, complete review of systems is positive for arthritic pains, mainly related to his back.  All other systems are reviewed and negative.  Physical Exam: VS:  BP 122/70 mmHg  Pulse 55  Ht 5\' 8"  (1.727 m)  Wt 187 lb (84.823 kg)  BMI 28.44 kg/m2  SpO2 98%, BMI Body mass index is 28.44 kg/(m^2).  Wt Readings from Last 3 Encounters:  12/11/15 187 lb (84.823 kg)  12/10/14 187 lb (84.823 kg)  09/07/14 197 lb (89.359 kg)    General: Overweight male, appears comfortable at rest. HEENT: Conjunctiva and lids normal, oropharynx clear. Neck: Supple, no elevated JVP, soft rightcarotid bruit, no thyromegaly. Lungs: Clear to auscultation, nonlabored breathing at rest. Cardiac: Regular rate and  rhythm, no S3 or significant systolic murmur, no pericardial rub. Abdomen: Soft, nontender, bowel sounds present, no guarding or rebound. Extremities: No pitting edema, distal pulses 2+. Skin: Warm and dry. Musculoskeletal: No kyphosis. Neuropsychiatric: Alert and oriented x3, affect grossly appropriate.  ECG: Tracing from 08/13/2014 showed sinus bradycardia with PAC and nonspecific T-wave changes.  Recent Labwork:  October 2014: AST 24, ALT 30, TSH 1.4, cholesterol 141, triglycerides 146, HDL 46, LDL 66   Other Studies Reviewed Today:  Exercise Cardiolite 08/22/2014: FINDINGS: ECG: The patient was stressed according to the Bruce protocol for 2 min 48 seconds achieving a work level of 4.6 Mets. The resting heart rate of 56 beats per min rose to a maximal heart rate of 106 beats per min. This value represents 60% of the maximal, age predicted heart rate. The resting blood pressure of 102/63 rose to a maximum blood pressure of 155/65. The stress test was stopped as the patient was experiencing dizziness and shortness of breath and ultimately that test was switched to a Lexiscan.  The baseline ECG demonstrated sinus bradycardia, HR 56 bpm. There was a nonspecific T wave abnormality at rest. With exercise, PVCs were noted as were nonspecific ST segment abnormalities. There were PVCs in recovery as well. This is a nondiagnostic ECG.  Raw data: No significant extracardiac radiotracer uptake.  Perfusion: Moderate size, moderate to severely intense, fixed defect seen in the infero apical, inferoapical septal, mid inferior and basal inferior walls. Images were better on stress when compared to rest. Regional wall motion was normal, suggesting this defect was due to soft tissue attenuation. There is no evidence of ischemia.  Wall Motion: Normal left ventricular wall motion. No left ventricular dilation.  Left Ventricular Ejection Fraction: 80 %  End diastolic volume 79  ml  End systolic volume 16 ml  IMPRESSION: 1. No reversible ischemia or infarction. Fixed inferior wall defect likely due to soft tissue attenuation given normal regional wall motion.  2. Normal left ventricular wall motion.  3. Left ventricular ejection fraction 80%  4. Low-risk stress test findings*.  Echocardiogram 08/22/2014: Study Conclusions  - Procedure narrative: Transthoracic echocardiography. Image quality was suboptimal. The study was technically difficult, as a result of poor sound wave transmission. - Left ventricle: The cavity size was normal. Wall thickness was increased in a pattern of mild LVH. Systolic function was normal. The estimated ejection fraction was in the range of 60% to 65%. Wall motion was normal; there were no regional wall motion abnormalities. Diastolic dysfunction, grade indeterminate. - Aortic valve: Aortic valve sclerosis without stenosis. Trileaflet; mildly thickened, mildly calcified leaflets. There was trivial regurgitation. Peak velocity (S): 203 cm/s. Mean gradient (S): 9 mm Hg. Valve area (VTI): 2.47 cm^2. Valve area (Vmax): 2.65 cm^2. Valve area (Vmean): 2.01 cm^2.  Assessment and Plan:  1. Symptomatically stable CAD. Cardiac catheterization in 2006 revealed branch vessel disease in the diagonal and obtuse marginal  distribution, also moderate RCA disease. He has not required any revascularization procedures and follow-up Cardiolite study from last year was low risk. Plan is to continue medical therapy and observation. He is currently on aspirin, statin, beta blocker , and Norvasc.  2. History of hyperlipidemia, on Lipitor. LDL from 2014 was 66. Requesting most recent lab work from Dr. Manuella Ghazi.  3. Peripheral arterial disease status post left common iliac artery stenting in 2004. Arterial Dopplers with ABIs were normal last year. He is not reporting claudication.  4. Essential hypertension, blood pressure is well  controlled today. No changes made in current regimen.  5. Bilateral carotid artery disease, right greater than left. Patient states that this has been followed yearly by his primary care provider. Requesting most recent carotid Doppler studies from Dr. Manuella Ghazi.  Current medicines were reviewed with the patient today.   Orders Placed This Encounter  Procedures  . EKG 12-Lead    Disposition: FU with me in 1 year.   Signed, Satira Sark, MD, [facility] 12/11/2015 8:54 AM    [facility], [place], [place] [address] Phone: [phone]; Fax: [phone]

## 2015-12-11 NOTE — Patient Instructions (Signed)
Continue all current medications. Nitroglycerin refill sent to [facility] today.  Continue all other medications.   Your physician wants you to follow up in:  1 year.  You will receive a reminder letter in the mail one-two months in advance.  If you don't receive a letter, please call our office to schedule the follow up appointment

## 2016-02-10 DIAGNOSIS — I259 Chronic ischemic heart disease, unspecified: Secondary | ICD-10-CM | POA: Diagnosis not present

## 2016-02-10 DIAGNOSIS — Z87891 Personal history of nicotine dependence: Secondary | ICD-10-CM | POA: Diagnosis not present

## 2016-02-10 DIAGNOSIS — E1142 Type 2 diabetes mellitus with diabetic polyneuropathy: Secondary | ICD-10-CM | POA: Diagnosis not present

## 2016-03-05 DIAGNOSIS — Z299 Encounter for prophylactic measures, unspecified: Secondary | ICD-10-CM | POA: Diagnosis not present

## 2016-03-05 DIAGNOSIS — Z87891 Personal history of nicotine dependence: Secondary | ICD-10-CM | POA: Diagnosis not present

## 2016-03-05 DIAGNOSIS — M109 Gout, unspecified: Secondary | ICD-10-CM | POA: Diagnosis not present

## 2016-03-20 DIAGNOSIS — E119 Type 2 diabetes mellitus without complications: Secondary | ICD-10-CM | POA: Diagnosis not present

## 2016-03-25 DIAGNOSIS — I10 Essential (primary) hypertension: Secondary | ICD-10-CM | POA: Diagnosis not present

## 2016-03-25 DIAGNOSIS — E119 Type 2 diabetes mellitus without complications: Secondary | ICD-10-CM | POA: Diagnosis not present

## 2016-03-25 DIAGNOSIS — I2510 Atherosclerotic heart disease of native coronary artery without angina pectoris: Secondary | ICD-10-CM | POA: Diagnosis not present

## 2016-03-25 DIAGNOSIS — E7800 Pure hypercholesterolemia, unspecified: Secondary | ICD-10-CM | POA: Diagnosis not present

## 2016-04-15 DIAGNOSIS — I2510 Atherosclerotic heart disease of native coronary artery without angina pectoris: Secondary | ICD-10-CM | POA: Diagnosis not present

## 2016-04-15 DIAGNOSIS — I10 Essential (primary) hypertension: Secondary | ICD-10-CM | POA: Diagnosis not present

## 2016-04-15 DIAGNOSIS — E7800 Pure hypercholesterolemia, unspecified: Secondary | ICD-10-CM | POA: Diagnosis not present

## 2016-04-15 DIAGNOSIS — E119 Type 2 diabetes mellitus without complications: Secondary | ICD-10-CM | POA: Diagnosis not present

## 2016-04-17 DIAGNOSIS — Z299 Encounter for prophylactic measures, unspecified: Secondary | ICD-10-CM | POA: Diagnosis not present

## 2016-04-17 DIAGNOSIS — I10 Essential (primary) hypertension: Secondary | ICD-10-CM | POA: Diagnosis not present

## 2016-04-17 DIAGNOSIS — M109 Gout, unspecified: Secondary | ICD-10-CM | POA: Diagnosis not present

## 2016-04-20 DIAGNOSIS — L409 Psoriasis, unspecified: Secondary | ICD-10-CM | POA: Diagnosis not present

## 2016-04-20 DIAGNOSIS — L570 Actinic keratosis: Secondary | ICD-10-CM | POA: Diagnosis not present

## 2016-04-30 DIAGNOSIS — Z713 Dietary counseling and surveillance: Secondary | ICD-10-CM | POA: Diagnosis not present

## 2016-04-30 DIAGNOSIS — Z6830 Body mass index (BMI) 30.0-30.9, adult: Secondary | ICD-10-CM | POA: Diagnosis not present

## 2016-04-30 DIAGNOSIS — E1165 Type 2 diabetes mellitus with hyperglycemia: Secondary | ICD-10-CM | POA: Diagnosis not present

## 2016-04-30 DIAGNOSIS — M109 Gout, unspecified: Secondary | ICD-10-CM | POA: Diagnosis not present

## 2016-05-04 DIAGNOSIS — H02413 Mechanical ptosis of bilateral eyelids: Secondary | ICD-10-CM | POA: Diagnosis not present

## 2016-05-04 DIAGNOSIS — H02831 Dermatochalasis of right upper eyelid: Secondary | ICD-10-CM | POA: Diagnosis not present

## 2016-05-04 DIAGNOSIS — H53483 Generalized contraction of visual field, bilateral: Secondary | ICD-10-CM | POA: Diagnosis not present

## 2016-05-04 DIAGNOSIS — H0279 Other degenerative disorders of eyelid and periocular area: Secondary | ICD-10-CM | POA: Diagnosis not present

## 2016-05-04 DIAGNOSIS — H02834 Dermatochalasis of left upper eyelid: Secondary | ICD-10-CM | POA: Diagnosis not present

## 2016-05-12 DIAGNOSIS — E1142 Type 2 diabetes mellitus with diabetic polyneuropathy: Secondary | ICD-10-CM | POA: Diagnosis not present

## 2016-05-12 DIAGNOSIS — I10 Essential (primary) hypertension: Secondary | ICD-10-CM | POA: Diagnosis not present

## 2016-05-22 DIAGNOSIS — I10 Essential (primary) hypertension: Secondary | ICD-10-CM | POA: Diagnosis not present

## 2016-05-22 DIAGNOSIS — E7800 Pure hypercholesterolemia, unspecified: Secondary | ICD-10-CM | POA: Diagnosis not present

## 2016-05-22 DIAGNOSIS — I2510 Atherosclerotic heart disease of native coronary artery without angina pectoris: Secondary | ICD-10-CM | POA: Diagnosis not present

## 2016-05-22 DIAGNOSIS — E119 Type 2 diabetes mellitus without complications: Secondary | ICD-10-CM | POA: Diagnosis not present

## 2016-06-19 DIAGNOSIS — M5136 Other intervertebral disc degeneration, lumbar region: Secondary | ICD-10-CM | POA: Diagnosis not present

## 2016-06-19 DIAGNOSIS — M4726 Other spondylosis with radiculopathy, lumbar region: Secondary | ICD-10-CM | POA: Diagnosis not present

## 2016-06-19 DIAGNOSIS — Z6828 Body mass index (BMI) 28.0-28.9, adult: Secondary | ICD-10-CM | POA: Diagnosis not present

## 2016-06-19 DIAGNOSIS — M5440 Lumbago with sciatica, unspecified side: Secondary | ICD-10-CM | POA: Diagnosis not present

## 2016-06-19 DIAGNOSIS — M4806 Spinal stenosis, lumbar region: Secondary | ICD-10-CM | POA: Diagnosis not present

## 2016-06-19 DIAGNOSIS — M4316 Spondylolisthesis, lumbar region: Secondary | ICD-10-CM | POA: Diagnosis not present

## 2016-06-26 DIAGNOSIS — I10 Essential (primary) hypertension: Secondary | ICD-10-CM | POA: Diagnosis not present

## 2016-06-26 DIAGNOSIS — E7800 Pure hypercholesterolemia, unspecified: Secondary | ICD-10-CM | POA: Diagnosis not present

## 2016-06-26 DIAGNOSIS — I2510 Atherosclerotic heart disease of native coronary artery without angina pectoris: Secondary | ICD-10-CM | POA: Diagnosis not present

## 2016-06-26 DIAGNOSIS — E119 Type 2 diabetes mellitus without complications: Secondary | ICD-10-CM | POA: Diagnosis not present

## 2016-07-14 DIAGNOSIS — I10 Essential (primary) hypertension: Secondary | ICD-10-CM | POA: Diagnosis not present

## 2016-07-14 DIAGNOSIS — I2510 Atherosclerotic heart disease of native coronary artery without angina pectoris: Secondary | ICD-10-CM | POA: Diagnosis not present

## 2016-07-14 DIAGNOSIS — E119 Type 2 diabetes mellitus without complications: Secondary | ICD-10-CM | POA: Diagnosis not present

## 2016-07-14 DIAGNOSIS — E7800 Pure hypercholesterolemia, unspecified: Secondary | ICD-10-CM | POA: Diagnosis not present

## 2016-07-23 DIAGNOSIS — M4806 Spinal stenosis, lumbar region: Secondary | ICD-10-CM | POA: Diagnosis not present

## 2016-07-23 DIAGNOSIS — M5136 Other intervertebral disc degeneration, lumbar region: Secondary | ICD-10-CM | POA: Diagnosis not present

## 2016-07-23 DIAGNOSIS — M4726 Other spondylosis with radiculopathy, lumbar region: Secondary | ICD-10-CM | POA: Diagnosis not present

## 2016-08-18 DIAGNOSIS — I739 Peripheral vascular disease, unspecified: Secondary | ICD-10-CM | POA: Diagnosis not present

## 2016-08-18 DIAGNOSIS — E1142 Type 2 diabetes mellitus with diabetic polyneuropathy: Secondary | ICD-10-CM | POA: Diagnosis not present

## 2016-08-18 DIAGNOSIS — M545 Low back pain: Secondary | ICD-10-CM | POA: Diagnosis not present

## 2016-08-18 DIAGNOSIS — D696 Thrombocytopenia, unspecified: Secondary | ICD-10-CM | POA: Diagnosis not present

## 2016-08-20 DIAGNOSIS — I10 Essential (primary) hypertension: Secondary | ICD-10-CM | POA: Diagnosis not present

## 2016-08-20 DIAGNOSIS — I2510 Atherosclerotic heart disease of native coronary artery without angina pectoris: Secondary | ICD-10-CM | POA: Diagnosis not present

## 2016-08-20 DIAGNOSIS — E7800 Pure hypercholesterolemia, unspecified: Secondary | ICD-10-CM | POA: Diagnosis not present

## 2016-08-20 DIAGNOSIS — E119 Type 2 diabetes mellitus without complications: Secondary | ICD-10-CM | POA: Diagnosis not present

## 2016-09-29 DIAGNOSIS — Z1211 Encounter for screening for malignant neoplasm of colon: Secondary | ICD-10-CM | POA: Diagnosis not present

## 2016-09-29 DIAGNOSIS — Z125 Encounter for screening for malignant neoplasm of prostate: Secondary | ICD-10-CM | POA: Diagnosis not present

## 2016-09-29 DIAGNOSIS — Z79899 Other long term (current) drug therapy: Secondary | ICD-10-CM | POA: Diagnosis not present

## 2016-09-29 DIAGNOSIS — Z7189 Other specified counseling: Secondary | ICD-10-CM | POA: Diagnosis not present

## 2016-09-29 DIAGNOSIS — Z6829 Body mass index (BMI) 29.0-29.9, adult: Secondary | ICD-10-CM | POA: Diagnosis not present

## 2016-09-29 DIAGNOSIS — Z1389 Encounter for screening for other disorder: Secondary | ICD-10-CM | POA: Diagnosis not present

## 2016-09-29 DIAGNOSIS — Z299 Encounter for prophylactic measures, unspecified: Secondary | ICD-10-CM | POA: Diagnosis not present

## 2016-09-29 DIAGNOSIS — Z0000 Encounter for general adult medical examination without abnormal findings: Secondary | ICD-10-CM | POA: Diagnosis not present

## 2016-09-29 DIAGNOSIS — E782 Mixed hyperlipidemia: Secondary | ICD-10-CM | POA: Diagnosis not present

## 2016-09-29 DIAGNOSIS — R5383 Other fatigue: Secondary | ICD-10-CM | POA: Diagnosis not present

## 2016-09-30 DIAGNOSIS — Z23 Encounter for immunization: Secondary | ICD-10-CM | POA: Diagnosis not present

## 2016-10-12 ENCOUNTER — Encounter: Payer: Self-pay | Admitting: Internal Medicine

## 2016-10-27 DIAGNOSIS — E119 Type 2 diabetes mellitus without complications: Secondary | ICD-10-CM | POA: Diagnosis not present

## 2016-10-27 DIAGNOSIS — I2510 Atherosclerotic heart disease of native coronary artery without angina pectoris: Secondary | ICD-10-CM | POA: Diagnosis not present

## 2016-10-27 DIAGNOSIS — E7800 Pure hypercholesterolemia, unspecified: Secondary | ICD-10-CM | POA: Diagnosis not present

## 2016-10-27 DIAGNOSIS — I10 Essential (primary) hypertension: Secondary | ICD-10-CM | POA: Diagnosis not present

## 2016-10-30 ENCOUNTER — Encounter: Payer: Self-pay | Admitting: Gastroenterology

## 2016-10-30 ENCOUNTER — Ambulatory Visit (INDEPENDENT_AMBULATORY_CARE_PROVIDER_SITE_OTHER): Payer: Medicare Other | Admitting: Gastroenterology

## 2016-10-30 ENCOUNTER — Other Ambulatory Visit: Payer: Self-pay

## 2016-10-30 DIAGNOSIS — R195 Other fecal abnormalities: Secondary | ICD-10-CM | POA: Insufficient documentation

## 2016-10-30 MED ORDER — PEG 3350-KCL-NA BICARB-NACL 420 G PO SOLR: 4000.0000 mL | ORAL | 0 refills | Status: DC

## 2016-10-30 NOTE — Patient Instructions (Signed)
We have scheduled you for a colonoscopy with Dr. Gala Romney.  Do not take metformin the day of the procedure.

## 2016-10-30 NOTE — Progress Notes (Signed)
Primary Care Physician:  Monico Blitz, MD Primary Gastroenterologist:  Dr. Gala Romney   Chief Complaint  Patient presents with  . Rectal Bleeding    HPI:   Christopher Randolph is a 70 y.o. male presenting today at the request of Dr. Manuella Ghazi secondary to heme positive stool. He reports a colonoscopy approximately by Dr. Anthony Sar; reports are not available at time of visit. He believes he had polyps at that time. Mother and brother both with history of polyps but no family history of colon cancer. Denies any bright red blood per rectum, abdominal pain, N/V. Rare diarrhea, usually attributed to cereal. No loss of appetite or weight loss. No upper GI symptoms.   Past Medical History:  Diagnosis Date  . CAD (coronary artery disease)    Branch vessel and moderate RCA disease 2006  . Carotid artery disease ([place])   . Diabetes ([place])   . Essential hypertension   . Hyperlipidemia   . Hypothyroidism   . PAD (peripheral artery disease) ([facility])    Left common iliac stent 2004    Past Surgical History:  Procedure Laterality Date  . CATARACT EXTRACTION, BILATERAL    . CERVICAL DISC SURGERY    . 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 NECK SURGERY    . REPLACEMENT TOTAL KNEE      Current Outpatient Prescriptions  Medication Sig Dispense Refill  . amLODipine (NORVASC) 2.5 MG tablet Take 1 tablet (2.5 mg total) by mouth daily. 30 tablet 6  . aspirin EC 81 MG tablet Take 81 mg by mouth daily.      Marland Kitchen atorvastatin (LIPITOR) 40 MG tablet Take 40 mg by mouth every morning.     Marland Kitchen levothyroxine (SYNTHROID, LEVOTHROID) 175 MCG tablet Take 175 mcg by mouth daily.      . metFORMIN (GLUCOPHAGE) 500 MG tablet Take 1 tablet by mouth daily.    . metoprolol tartrate (LOPRESSOR) 25 MG tablet Take 25 mg by mouth 2 (two) times daily.      . nitroGLYCERIN (NITROSTAT) 0.4 MG SL tablet Place 1 tablet (0.4 mg total) under the tongue every 5 (five) minutes as needed for chest pain. 25  tablet 3  . telmisartan-hydrochlorothiazide (MICARDIS HCT) 80-25 MG per tablet Take 1 tablet by mouth daily.       No current facility-administered medications for this visit.     Allergies as of 10/30/2016 - Review Complete 10/30/2016  Allergen Reaction Noted  . Azithromycin      Family History  Problem Relation Age of Onset  . Heart disease Mother   . Heart attack Mother   . Colon polyps Mother   . Heart attack Maternal Grandmother   . Heart attack Maternal Grandfather   . Colon polyps Brother   . Colon cancer Neg Hx     Social History   Social History  . Marital status: Married    Spouse name: N/A  . Number of children: N/A  . Years of education: N/A   Occupational History  . Not on file.   Social History Main Topics  . Smoking status: Former Smoker    Packs/day: 1.50    Years: 40.00    Types: Cigarettes    Start date: 11/04/1963    Quit date: 10/29/2003  . Smokeless tobacco: Never Used  . Alcohol use 0.0 oz/week     Comment: one miller lite daily   . Drug use: No  . Sexual activity: Not  on file   Other Topics Concern  . Not on file   Social History Narrative  . No narrative on file    Review of Systems: Gen: Denies any fever, chills, fatigue, weight loss, lack of appetite.  CV: Denies chest pain, heart palpitations, peripheral edema, syncope.  Resp: Denies shortness of breath at rest or with exertion. Denies wheezing or cough.  GI: see HPI  GU : Denies urinary burning, urinary frequency, urinary hesitancy MS: Denies joint pain, muscle weakness, cramps, or limitation of movement.  Derm: Denies rash, itching, dry skin Psych: Denies depression, anxiety, memory loss, and confusion Heme: Denies bruising, bleeding, and enlarged lymph nodes.  Physical Exam: BP 106/66   Pulse (!) 57   Temp 97.8 F (36.6 C) (Oral)   Ht 5\' 6"  (1.676 m)   Wt 189 lb (85.7 kg)   BMI 30.51 kg/m  General:   Alert and oriented. Pleasant and cooperative. Well-nourished and  well-developed.  Head:  Normocephalic and atraumatic. Eyes:  Without icterus, sclera clear and conjunctiva pink.  Ears:  Normal auditory acuity. Nose:  No deformity, discharge,  or lesions. Mouth:  No deformity or lesions, oral mucosa pink.  Lungs:  Clear to auscultation bilaterally. No wheezes, rales, or rhonchi. No distress.  Heart:  S1, S2 present without murmurs appreciated.  Abdomen:  +BS, soft, non-tender and non-distended. No HSM noted. No guarding or rebound. Small umbilical hernia. Rectus diastasis vs ventral hernia, favor rectus diastasis.   Rectal:  Deferred  Msk:  Symmetrical without gross deformities. Normal posture. Extremities:  Without edema. Neurologic:  Alert and  oriented x4 Psych:  Alert and cooperative. Normal mood and affect.

## 2016-10-30 NOTE — Assessment & Plan Note (Signed)
70 year old male with heme positive stool, without any concerning lower or upper GI symptoms. Noted personal history of polyps with last colonoscopy by Dr. Anthony Sar approximately 5 years ago, which we have requested. Mother and brother with history of polyps but no family history of colon cancer.   Proceed with TCS with Dr. Gala Romney in near future: the risks, benefits, and alternatives have been discussed with the patient in detail. The patient states understanding and desires to proceed. Phenergan 12.5 mg IV on call to augment sedation (1 beer a day)

## 2016-11-02 DIAGNOSIS — I6523 Occlusion and stenosis of bilateral carotid arteries: Secondary | ICD-10-CM | POA: Diagnosis not present

## 2016-11-02 DIAGNOSIS — I6521 Occlusion and stenosis of right carotid artery: Secondary | ICD-10-CM | POA: Diagnosis not present

## 2016-11-02 NOTE — Progress Notes (Signed)
CC'D TO PCP °

## 2016-11-06 ENCOUNTER — Encounter (HOSPITAL_COMMUNITY)
Admission: RE | Disposition: A | Discharge status: Home / Self Care | Payer: Self-pay | Source: Ambulatory Visit | Attending: Internal Medicine

## 2016-11-06 ENCOUNTER — Encounter (HOSPITAL_COMMUNITY): Payer: Self-pay | Admitting: *Deleted

## 2016-11-06 ENCOUNTER — Ambulatory Visit (HOSPITAL_COMMUNITY)
Admission: RE | Admit: 2016-11-06 | Discharge: 2016-11-06 | Disposition: A | Discharge status: Home / Self Care | Payer: Medicare Other | Source: Ambulatory Visit | Attending: Internal Medicine | Admitting: Internal Medicine

## 2016-11-06 DIAGNOSIS — Z9842 Cataract extraction status, left eye: Secondary | ICD-10-CM | POA: Diagnosis not present

## 2016-11-06 DIAGNOSIS — K625 Hemorrhage of anus and rectum: Secondary | ICD-10-CM | POA: Diagnosis not present

## 2016-11-06 DIAGNOSIS — I2510 Atherosclerotic heart disease of native coronary artery without angina pectoris: Secondary | ICD-10-CM | POA: Insufficient documentation

## 2016-11-06 DIAGNOSIS — I10 Essential (primary) hypertension: Secondary | ICD-10-CM | POA: Diagnosis not present

## 2016-11-06 DIAGNOSIS — D125 Benign neoplasm of sigmoid colon: Secondary | ICD-10-CM | POA: Diagnosis not present

## 2016-11-06 DIAGNOSIS — I739 Peripheral vascular disease, unspecified: Secondary | ICD-10-CM | POA: Insufficient documentation

## 2016-11-06 DIAGNOSIS — E119 Type 2 diabetes mellitus without complications: Secondary | ICD-10-CM | POA: Diagnosis not present

## 2016-11-06 DIAGNOSIS — Z7982 Long term (current) use of aspirin: Secondary | ICD-10-CM | POA: Insufficient documentation

## 2016-11-06 DIAGNOSIS — Z86010 Personal history of colonic polyps: Secondary | ICD-10-CM | POA: Insufficient documentation

## 2016-11-06 DIAGNOSIS — E039 Hypothyroidism, unspecified: Secondary | ICD-10-CM | POA: Diagnosis not present

## 2016-11-06 DIAGNOSIS — Z881 Allergy status to other antibiotic agents status: Secondary | ICD-10-CM | POA: Insufficient documentation

## 2016-11-06 DIAGNOSIS — Z9841 Cataract extraction status, right eye: Secondary | ICD-10-CM | POA: Insufficient documentation

## 2016-11-06 DIAGNOSIS — K5730 Diverticulosis of large intestine without perforation or abscess without bleeding: Secondary | ICD-10-CM | POA: Insufficient documentation

## 2016-11-06 DIAGNOSIS — K635 Polyp of colon: Secondary | ICD-10-CM | POA: Diagnosis not present

## 2016-11-06 DIAGNOSIS — Z79899 Other long term (current) drug therapy: Secondary | ICD-10-CM | POA: Insufficient documentation

## 2016-11-06 DIAGNOSIS — Z87891 Personal history of nicotine dependence: Secondary | ICD-10-CM | POA: Diagnosis not present

## 2016-11-06 DIAGNOSIS — K643 Fourth degree hemorrhoids: Secondary | ICD-10-CM | POA: Diagnosis not present

## 2016-11-06 DIAGNOSIS — R195 Other fecal abnormalities: Secondary | ICD-10-CM | POA: Diagnosis not present

## 2016-11-06 DIAGNOSIS — Z8371 Family history of colonic polyps: Secondary | ICD-10-CM | POA: Diagnosis not present

## 2016-11-06 DIAGNOSIS — Z7984 Long term (current) use of oral hypoglycemic drugs: Secondary | ICD-10-CM | POA: Diagnosis not present

## 2016-11-06 DIAGNOSIS — E785 Hyperlipidemia, unspecified: Secondary | ICD-10-CM | POA: Insufficient documentation

## 2016-11-06 DIAGNOSIS — Z96659 Presence of unspecified artificial knee joint: Secondary | ICD-10-CM | POA: Diagnosis not present

## 2016-11-06 DIAGNOSIS — Z8249 Family history of ischemic heart disease and other diseases of the circulatory system: Secondary | ICD-10-CM | POA: Diagnosis not present

## 2016-11-06 HISTORY — DX: Other specified postprocedural states: Z98.890

## 2016-11-06 HISTORY — DX: Nausea with vomiting, unspecified: R11.2

## 2016-11-06 HISTORY — PX: COLONOSCOPY: SHX5424

## 2016-11-06 HISTORY — DX: Unspecified osteoarthritis, unspecified site: M19.90

## 2016-11-06 HISTORY — DX: Malignant (primary) neoplasm, unspecified: C80.1

## 2016-11-06 LAB — CBC
HEMATOCRIT: 39.6 % (ref 39.0–52.0)
Hemoglobin: 13.3 g/dL (ref 13.0–17.0)
MCH: 30.5 pg (ref 26.0–34.0)
MCHC: 33.6 g/dL (ref 30.0–36.0)
MCV: 90.8 fL (ref 78.0–100.0)
PLATELETS: 99 10*3/uL — AB (ref 150–400)
RBC: 4.36 MIL/uL (ref 4.22–5.81)
RDW: 13 % (ref 11.5–15.5)
WBC: 4.5 10*3/uL (ref 4.0–10.5)

## 2016-11-06 LAB — GLUCOSE, CAPILLARY: GLUCOSE-CAPILLARY: 122 mg/dL — AB (ref 65–99)

## 2016-11-06 SURGERY — COLONOSCOPY
Anesthesia: Moderate Sedation

## 2016-11-06 MED ORDER — ONDANSETRON HCL 4 MG/2ML IJ SOLN
INTRAMUSCULAR | Status: DC | PRN
  Administered 2016-11-06: 4 mg via INTRAVENOUS

## 2016-11-06 MED ORDER — MIDAZOLAM HCL 5 MG/5ML IJ SOLN
INTRAMUSCULAR | Status: AC
  Filled 2016-11-06: qty 10

## 2016-11-06 MED ORDER — SODIUM CHLORIDE 0.9 % IV SOLN: INTRAVENOUS | Status: DC

## 2016-11-06 MED ORDER — SODIUM CHLORIDE 0.9% FLUSH
INTRAVENOUS | Status: AC
  Filled 2016-11-06: qty 10

## 2016-11-06 MED ORDER — PROMETHAZINE HCL 25 MG/ML IJ SOLN
12.5000 mg | Freq: Once | INTRAMUSCULAR | Status: AC
  Administered 2016-11-06: 12.5 mg via INTRAVENOUS

## 2016-11-06 MED ORDER — ONDANSETRON HCL 4 MG/2ML IJ SOLN
INTRAMUSCULAR | Status: AC
  Filled 2016-11-06: qty 2

## 2016-11-06 MED ORDER — MEPERIDINE HCL 100 MG/ML IJ SOLN
INTRAMUSCULAR | Status: AC
  Filled 2016-11-06: qty 2

## 2016-11-06 MED ORDER — STERILE WATER FOR IRRIGATION IR SOLN
Status: DC | PRN
  Administered 2016-11-06: 2.5 mL

## 2016-11-06 MED ORDER — MIDAZOLAM HCL 5 MG/5ML IJ SOLN
INTRAMUSCULAR | Status: DC | PRN
  Administered 2016-11-06: 1 mg via INTRAVENOUS
  Administered 2016-11-06: 2 mg via INTRAVENOUS
  Administered 2016-11-06: 1 mg via INTRAVENOUS

## 2016-11-06 MED ORDER — PROMETHAZINE HCL 25 MG/ML IJ SOLN
INTRAMUSCULAR | Status: AC
  Filled 2016-11-06: qty 1

## 2016-11-06 MED ORDER — MEPERIDINE HCL 100 MG/ML IJ SOLN
INTRAMUSCULAR | Status: DC | PRN
  Administered 2016-11-06: 25 mg via INTRAVENOUS

## 2016-11-06 NOTE — Op Note (Signed)
[facility] Patient Name: Christopher Randolph Procedure Date: 11/06/2016 7:13 AM MRN: [id number] Date of Birth: 01/25/1946 Attending MD: Norvel Richards , MD CSN: [id number] Age: 70 Admit Type: Outpatient Procedure:                Colonoscopy with snare polypectomy Indications:              Heme positive stool; no GI symptoms. Providers:                Norvel Richards, MD, Jeanann Lewandowsky. Sharon Seller, RN,                            Charlyne Petrin RN, RN, Lurline Del, RN, Randa Spike, Technician Referring MD:              Medicines:                Midazolam 4 mg IV, Meperidine 50 mg IV,                            Promethazine 12.5 mg IV, Ondansetron 4 mg IV Complications:            No immediate complications. Estimated Blood Loss:     Estimated blood loss: none. Procedure:                Pre-Anesthesia Assessment:                           - Prior to the procedure, a History and Physical                            was performed, and patient medications and                            allergies were reviewed. The patient's tolerance of                            previous anesthesia was also reviewed. The risks                            and benefits of the procedure and the sedation                            options and risks were discussed with the patient.                            All questions were answered, and informed consent                            was obtained. Prior Anticoagulants: The patient has                            taken no previous anticoagulant or antiplatelet  agents. ASA Grade Assessment: II - A patient with                            mild systemic disease. After reviewing the risks                            and benefits, the patient was deemed in                            satisfactory condition to undergo the procedure.                           After obtaining informed consent, the colonoscope                           was passed under direct vision. Throughout the                            procedure, the patient's blood pressure, pulse, and                            oxygen saturations were monitored continuously. The                            [id number] scope was introduced through                            the anus and advanced to the the cecum, identified                            by appendiceal orifice and ileocecal valve. The                            colonoscopy was performed without difficulty. The                            patient tolerated the procedure well. The quality                            of the bowel preparation was adequate. The                            ileocecal valve, appendiceal orifice, and rectum                            were photographed. The entire colon was well                            visualized. Scope In: 7:43:20 AM Scope Out: 7:59:21 AM Scope Withdrawal Time: 0 hours 11 minutes 20 seconds  Total Procedure Duration: 0 hours 16 minutes 1 second  Findings:      The perianal exam was abnormal. Prominent grade 3/grade 4 hemorrhoids       present.      Scattered small and  large-mouthed diverticula were found in the entire       colon.      A 8 mm polyp was found in the sigmoid colon. The polyp was       semi-pedunculated. The polyp was removed with a hot snare. Resection and       retrieval were complete. Estimated blood loss: none. Estimated blood       loss: none.      The exam was otherwise without abnormality on direct and retroflexion       views. Impression:               - Abnormal perianal exam. Grade 3/grade 4                            hemorrhoids                           - Diverticulosis in the entire examined colon.                           - One 8 mm polyp in the sigmoid colon, removed with                            a hot snare. Resected and retrieved.                           - The examination was otherwise  normal on direct                            and retroflexion views. I suspect Hemoccult                            positive stool could easily be due to the presence                            of the hemorrhoids. Moderate Sedation:      Moderate (conscious) sedation was administered by the endoscopy nurse       and supervised by the endoscopist. The following parameters were       monitored: oxygen saturation, heart rate, blood pressure, respiratory       rate, EKG, adequacy of pulmonary ventilation, and response to care.       Total physician intraservice time was 27 minutes. Recommendation:           - Patient has a contact number available for                            emergencies. The signs and symptoms of potential                            delayed complications were discussed with the                            patient. Return to normal activities tomorrow.  Written discharge instructions were provided to the                            patient.                           - Resume previous diet.                           - Continue present medications. CBC today.                           - Repeat colonoscopy date to be determined after                            pending pathology results are reviewed for                            surveillance based on pathology results.                           - Return to GI office (date not yet determined). Procedure Code(s):        --- Professional ---                           [phone], Colonoscopy, flexible; with removal of                            tumor(s), polyp(s), or other lesion(s) by snare                            technique                           99152, Moderate sedation services provided by the                            same physician or other qualified health care                            professional performing the diagnostic or                            therapeutic service that the sedation  supports,                            requiring the presence of an independent trained                            observer to assist in the monitoring of the                            patient's level of consciousness and physiological                            status;  initial 15 minutes of intraservice time,                            patient age 33 years or older                           [phone], Moderate sedation services; each additional                            15 minutes intraservice time Diagnosis Code(s):        --- Professional ---                           D12.5, Benign neoplasm of sigmoid colon                           R19.5, Other fecal abnormalities                           K57.30, Diverticulosis of large intestine without                            perforation or abscess without bleeding CPT copyright 2016 [employer]. All rights reserved. The codes documented in this report are preliminary and upon coder review may  be revised to meet current compliance requirements. Cristopher Estimable. Keith Felten, MD Norvel Richards, MD 11/06/2016 8:11:56 AM This report has been signed electronically. Number of Addenda: 0

## 2016-11-06 NOTE — Discharge Instructions (Addendum)
Diverticulosis Diverticulosis is the condition that develops when small pouches (diverticula) form in the wall of your colon. Your colon, or large intestine, is where water is absorbed and stool is formed. The pouches form when the inside layer of your colon pushes through weak spots in the outer layers of your colon. CAUSES  No one knows exactly what causes diverticulosis. RISK FACTORS Being older than 73. Your risk for this condition increases with age. Diverticulosis is rare in people younger than 40 years. By age 23, almost everyone has it. Eating a low-fiber diet. Being frequently constipated. Being overweight. Not getting enough exercise. Smoking. Taking over-the-counter pain medicines, like aspirin and ibuprofen. SYMPTOMS  Most people with diverticulosis do not have symptoms. DIAGNOSIS  Because diverticulosis often has no symptoms, health care providers often discover the condition during an exam for other colon problems. In many cases, a health care provider will diagnose diverticulosis while using a flexible scope to examine the colon (colonoscopy). TREATMENT  If you have never developed an infection related to diverticulosis, you may not need treatment. If you have had an infection before, treatment may include: Eating more fruits, vegetables, and grains. Taking a fiber supplement. Taking a live bacteria supplement (probiotic). Taking medicine to relax your colon. HOME CARE INSTRUCTIONS  Drink at least 6-8 glasses of water each day to prevent constipation. Try not to strain when you have a bowel movement. Keep all follow-up appointments. If you have had an infection before: Increase the fiber in your diet as directed by your health care provider or dietitian. Take a dietary fiber supplement if your health care provider approves. Only take medicines as directed by your health care provider. SEEK MEDICAL CARE IF:  You have abdominal pain. You have bloating. You have  cramps. You have not gone to the bathroom in 3 days. SEEK IMMEDIATE MEDICAL CARE IF:  Your pain gets worse. Yourbloating becomes very bad. You have a fever or chills, and your symptoms suddenly get worse. You begin vomiting. You have bowel movements that are bloody or black. MAKE SURE YOU: Understand these instructions. Will watch your condition. Will get help right away if you are not doing well or get worse.   This information is not intended to replace advice given to you by your health care provider. Make sure you discuss any questions you have with your health care provider.   Document Released: 09/10/2004 Document Revised: 12/19/2013 Document Reviewed: 11/08/2013 Elsevier Interactive Patient Education 2016 [employer]. Hemorrhoids Hemorrhoids are swollen veins around the rectum or anus. There are two types of hemorrhoids:   Internal hemorrhoids. These occur in the veins just inside the rectum. They may poke through to the outside and become irritated and painful.  External hemorrhoids. These occur in the veins outside the anus and can be felt as a painful swelling or hard lump near the anus. CAUSES  Pregnancy.   Obesity.   Constipation or diarrhea.   Straining to have a bowel movement.   Sitting for long periods on the toilet.  Heavy lifting or other activity that caused you to strain.  Anal intercourse. SYMPTOMS   Pain.   Anal itching or irritation.   Rectal bleeding.   Fecal leakage.   Anal swelling.   One or more lumps around the anus.  DIAGNOSIS  Your caregiver may be able to diagnose hemorrhoids by visual examination. Other examinations or tests that may be performed include:   Examination of the rectal area with a gloved hand (  digital rectal exam).   Examination of anal canal using a small tube (scope).   A blood test if you have lost a significant amount of blood.  A test to look inside the colon (sigmoidoscopy or  colonoscopy). TREATMENT Most hemorrhoids can be treated at home. However, if symptoms do not seem to be getting better or if you have a lot of rectal bleeding, your caregiver may perform a procedure to help make the hemorrhoids get smaller or remove them completely. Possible treatments include:   Placing a rubber band at the base of the hemorrhoid to cut off the circulation (rubber band ligation).   Injecting a chemical to shrink the hemorrhoid (sclerotherapy).   Using a tool to burn the hemorrhoid (infrared light therapy).   Surgically removing the hemorrhoid (hemorrhoidectomy).   Stapling the hemorrhoid to block blood flow to the tissue (hemorrhoid stapling).  HOME CARE INSTRUCTIONS   Eat foods with fiber, such as whole grains, beans, nuts, fruits, and vegetables. Ask your doctor about taking products with added fiber in them (fibersupplements).  Increase fluid intake. Drink enough water and fluids to keep your urine clear or pale yellow.   Exercise regularly.   Go to the bathroom when you have the urge to have a bowel movement. Do not wait.   Avoid straining to have bowel movements.   Keep the anal area dry and clean. Use wet toilet paper or moist towelettes after a bowel movement.   Medicated creams and suppositories may be used or applied as directed.   Only take over-the-counter or prescription medicines as directed by your caregiver.   Take warm sitz baths for 15-20 minutes, 3-4 times a day to ease pain and discomfort.   Place ice packs on the hemorrhoids if they are tender and swollen. Using ice packs between sitz baths may be helpful.   Put ice in a plastic bag.   Place a towel between your skin and the bag.   Leave the ice on for 15-20 minutes, 3-4 times a day.   Do not use a donut-shaped pillow or sit on the toilet for long periods. This increases blood pooling and pain.  SEEK MEDICAL CARE IF:  You have increasing pain and swelling that is not  controlled by treatment or medicine.  You have uncontrolled bleeding.  You have difficulty or you are unable to have a bowel movement.  You have pain or inflammation outside the area of the hemorrhoids. MAKE SURE YOU:  Understand these instructions.  Will watch your condition.  Will get help right away if you are not doing well or get worse.   This information is not intended to replace advice given to you by your health care provider. Make sure you discuss any questions you have with your health care provider.   Document Released: 12/11/2000 Document Revised: 11/30/2012 Document Reviewed: 10/18/2012 Elsevier Interactive Patient Education 2016 Else[employer] Inc[employer]  Colonoscopy Discharge Instructions  Read the instructions outlined below and refer to this sheet in the next few weeks. These discharge instructions provide you with general information on caring for yourself after you leave the hospital. Your doctor may also give you specific instructions. While your treatment has been planned according to the most current medical practices available, unavoidable complications occasionally occur. If you have any problems or questions after discharge, call Dr. Gala Romney at [phone]. ACTIVITY  You may resume your regular activity, but move at a slower pace for the next 24 hours.   Take frequent rest periods for  the next 24 hours.   Walking will help get rid of the air and reduce the bloated feeling in your belly (abdomen).   No driving for 24 hours (because of the medicine (anesthesia) used during the test).    Do not sign any important legal documents or operate any machinery for 24 hours (because of the anesthesia used during the test).  NUTRITION  Drink plenty of fluids.   You may resume your normal diet as instructed by your doctor.   Begin with a light meal and progress to your normal diet. Heavy or fried foods are harder to digest and may make you feel sick to your stomach (nauseated).    Avoid alcoholic beverages for 24 hours or as instructed.  MEDICATIONS  You may resume your normal medications unless your doctor tells you otherwise.  WHAT YOU CAN EXPECT TODAY  Some feelings of bloating in the abdomen.   Passage of more gas than usual.   Spotting of blood in your stool or on the toilet paper.  IF YOU HAD POLYPS REMOVED DURING THE COLONOSCOPY:  No aspirin products for 7 days or as instructed.   No alcohol for 7 days or as instructed.   Eat a soft diet for the next 24 hours.  FINDING OUT THE RESULTS OF YOUR TEST Not all test results are available during your visit. If your test results are not back during the visit, make an appointment with your caregiver to find out the results. Do not assume everything is normal if you have not heard from your caregiver or the medical facility. It is important for you to follow up on all of your test results.  SEEK IMMEDIATE MEDICAL ATTENTION IF:  You have more than a spotting of blood in your stool.   Your belly is swollen (abdominal distention).   You are nauseated or vomiting.   You have a temperature over 101.   You have abdominal pain or discomfort that is severe or gets worse throughout the day.     Hemorrhoid, colonic diverticulosis and polyp information provided  CBC today  Further recommendations to follow pending review of pathology report

## 2016-11-06 NOTE — H&P (View-Only) (Signed)
Primary Care Physician:  Monico Blitz, MD Primary Gastroenterologist:  Dr. Gala Romney   Chief Complaint  Patient presents with  . Rectal Bleeding    HPI:   Christopher Randolph is a 70 y.o. male presenting today at the request of Dr. Manuella Ghazi secondary to heme positive stool. He reports a colonoscopy approximately by Dr. Anthony Sar; reports are not available at time of visit. He believes he had polyps at that time. Mother and brother both with history of polyps but no family history of colon cancer. Denies any bright red blood per rectum, abdominal pain, N/V. Rare diarrhea, usually attributed to cereal. No loss of appetite or weight loss. No upper GI symptoms.   Past Medical History:  Diagnosis Date  . CAD (coronary artery disease)    Branch vessel and moderate RCA disease 2006  . Carotid artery disease ([place])   . Diabetes ([place])   . Essential hypertension   . Hyperlipidemia   . Hypothyroidism   . PAD (peripheral artery disease) ([facility])    Left common iliac stent 2004    Past Surgical History:  Procedure Laterality Date  . CATARACT EXTRACTION, BILATERAL    . CERVICAL DISC SURGERY    . MELANOMA EXCISION     right leg, seen [facility] and underwent immunotherapy  . NECK SURGERY    . REPLACEMENT TOTAL KNEE      Current Outpatient Prescriptions  Medication Sig Dispense Refill  . amLODipine (NORVASC) 2.5 MG tablet Take 1 tablet (2.5 mg total) by mouth daily. 30 tablet 6  . aspirin EC 81 MG tablet Take 81 mg by mouth daily.      Marland Kitchen atorvastatin (LIPITOR) 40 MG tablet Take 40 mg by mouth every morning.     Marland Kitchen levothyroxine (SYNTHROID, LEVOTHROID) 175 MCG tablet Take 175 mcg by mouth daily.      . metFORMIN (GLUCOPHAGE) 500 MG tablet Take 1 tablet by mouth daily.    . metoprolol tartrate (LOPRESSOR) 25 MG tablet Take 25 mg by mouth 2 (two) times daily.      . nitroGLYCERIN (NITROSTAT) 0.4 MG SL tablet Place 1 tablet (0.4 mg total) under the tongue every 5 (five) minutes as needed for chest pain. 25  tablet 3  . telmisartan-hydrochlorothiazide (MICARDIS HCT) 80-25 MG per tablet Take 1 tablet by mouth daily.       No current facility-administered medications for this visit.     Allergies as of 10/30/2016 - Review Complete 10/30/2016  Allergen Reaction Noted  . Azithromycin      Family History  Problem Relation Age of Onset  . Heart disease Mother   . Heart attack Mother   . Colon polyps Mother   . Heart attack Maternal Grandmother   . Heart attack Maternal Grandfather   . Colon polyps Brother   . Colon cancer Neg Hx     Social History   Social History  . Marital status: Married    Spouse name: N/A  . Number of children: N/A  . Years of education: N/A   Occupational History  . Not on file.   Social History Main Topics  . Smoking status: Former Smoker    Packs/day: 1.50    Years: 40.00    Types: Cigarettes    Start date: 11/04/1963    Quit date: 10/29/2003  . Smokeless tobacco: Never Used  . Alcohol use 0.0 oz/week     Comment: one miller lite daily   . Drug use: No  . Sexual activity: Not  on file   Other Topics Concern  . Not on file   Social History Narrative  . No narrative on file    Review of Systems: Gen: Denies any fever, chills, fatigue, weight loss, lack of appetite.  CV: Denies chest pain, heart palpitations, peripheral edema, syncope.  Resp: Denies shortness of breath at rest or with exertion. Denies wheezing or cough.  GI: see HPI  GU : Denies urinary burning, urinary frequency, urinary hesitancy MS: Denies joint pain, muscle weakness, cramps, or limitation of movement.  Derm: Denies rash, itching, dry skin Psych: Denies depression, anxiety, memory loss, and confusion Heme: Denies bruising, bleeding, and enlarged lymph nodes.  Physical Exam: BP 106/66   Pulse (!) 57   Temp 97.8 F (36.6 C) (Oral)   Ht 5\' 6"  (1.676 m)   Wt 189 lb (85.7 kg)   BMI 30.51 kg/m  General:   Alert and oriented. Pleasant and cooperative. Well-nourished and  well-developed.  Head:  Normocephalic and atraumatic. Eyes:  Without icterus, sclera clear and conjunctiva pink.  Ears:  Normal auditory acuity. Nose:  No deformity, discharge,  or lesions. Mouth:  No deformity or lesions, oral mucosa pink.  Lungs:  Clear to auscultation bilaterally. No wheezes, rales, or rhonchi. No distress.  Heart:  S1, S2 present without murmurs appreciated.  Abdomen:  +BS, soft, non-tender and non-distended. No HSM noted. No guarding or rebound. Small umbilical hernia. Rectus diastasis vs ventral hernia, favor rectus diastasis.   Rectal:  Deferred  Msk:  Symmetrical without gross deformities. Normal posture. Extremities:  Without edema. Neurologic:  Alert and  oriented x4 Psych:  Alert and cooperative. Normal mood and affect.

## 2016-11-06 NOTE — Interval H&P Note (Signed)
History and Physical Interval Note:  11/06/2016 7:35 AM  Christopher Randolph  has presented today for surgery, with the diagnosis of Heme positive stool  The various methods of treatment have been discussed with the patient and family. After consideration of risks, benefits and other options for treatment, the patient has consented to  Procedure(s) with comments: COLONOSCOPY (N/A) - 7:30 AM as a surgical intervention .  The patient's history has been reviewed, patient examined, no change in status, stable for surgery.  I have reviewed the patient's chart and labs.  Questions were answered to the patient's satisfaction.     No change. Diagnostic colonoscopy per plan.  The risks, benefits, limitations, alternatives and imponderables have been reviewed with the patient. Questions have been answered. All parties are agreeable.   Manus Rudd

## 2016-11-10 ENCOUNTER — Encounter: Payer: Self-pay | Admitting: Internal Medicine

## 2016-11-11 ENCOUNTER — Encounter (HOSPITAL_COMMUNITY): Payer: Self-pay | Admitting: Internal Medicine

## 2016-12-03 DIAGNOSIS — M109 Gout, unspecified: Secondary | ICD-10-CM | POA: Diagnosis not present

## 2016-12-03 DIAGNOSIS — E1142 Type 2 diabetes mellitus with diabetic polyneuropathy: Secondary | ICD-10-CM | POA: Diagnosis not present

## 2016-12-03 DIAGNOSIS — Z299 Encounter for prophylactic measures, unspecified: Secondary | ICD-10-CM | POA: Diagnosis not present

## 2016-12-03 DIAGNOSIS — Z713 Dietary counseling and surveillance: Secondary | ICD-10-CM | POA: Diagnosis not present

## 2016-12-03 DIAGNOSIS — Z6830 Body mass index (BMI) 30.0-30.9, adult: Secondary | ICD-10-CM | POA: Diagnosis not present

## 2016-12-04 DIAGNOSIS — I10 Essential (primary) hypertension: Secondary | ICD-10-CM | POA: Diagnosis not present

## 2016-12-04 DIAGNOSIS — E7800 Pure hypercholesterolemia, unspecified: Secondary | ICD-10-CM | POA: Diagnosis not present

## 2016-12-04 DIAGNOSIS — M4316 Spondylolisthesis, lumbar region: Secondary | ICD-10-CM | POA: Diagnosis not present

## 2016-12-04 DIAGNOSIS — M4726 Other spondylosis with radiculopathy, lumbar region: Secondary | ICD-10-CM | POA: Diagnosis not present

## 2016-12-04 DIAGNOSIS — M5136 Other intervertebral disc degeneration, lumbar region: Secondary | ICD-10-CM | POA: Diagnosis not present

## 2016-12-04 DIAGNOSIS — M5440 Lumbago with sciatica, unspecified side: Secondary | ICD-10-CM | POA: Diagnosis not present

## 2016-12-04 DIAGNOSIS — Z6828 Body mass index (BMI) 28.0-28.9, adult: Secondary | ICD-10-CM | POA: Diagnosis not present

## 2016-12-04 DIAGNOSIS — E119 Type 2 diabetes mellitus without complications: Secondary | ICD-10-CM | POA: Diagnosis not present

## 2016-12-04 DIAGNOSIS — M48062 Spinal stenosis, lumbar region with neurogenic claudication: Secondary | ICD-10-CM | POA: Diagnosis not present

## 2016-12-04 DIAGNOSIS — I2510 Atherosclerotic heart disease of native coronary artery without angina pectoris: Secondary | ICD-10-CM | POA: Diagnosis not present

## 2016-12-09 ENCOUNTER — Encounter: Payer: Self-pay | Admitting: Gastroenterology

## 2016-12-09 NOTE — Progress Notes (Signed)
Cardiology Office Note  Date: 12/10/2016   ID: Christopher Randolph, DOB 09/12/1946, MRN [id number]  PCP: Monico Blitz, MD  Primary Cardiologist: Rozann Lesches, MD   Chief Complaint  Patient presents with  . Coronary Artery Disease    History of Present Illness: Christopher Randolph is a 70 y.o. male that I met back in December 2016. He presents for a routine follow-up visit. Over the last year he does not report any accelerating angina symptoms or nitroglycerin use. Describes NYHA class II dyspnea with typical activities.  I reviewed his ECG today which shows sinus bradycardia. Last ischemic evaluation was in 2015 as outlined below. He states that he has had follow-up carotid Dopplers within the last year per Dr. Manuella Ghazi, we are requesting the results.  Also reports chronic leg pain, most of which sounds like neuropathy. He does have a history of PAD status post prior left common iliac stent intervention in 2004. ABIs were last done in 2015.  I reviewed his medications. Current cardiac regimen includes aspirin, Norvasc, Lipitor, Lopressor, Micardis, and as needed nitroglycerin. We are requesting his most recent lipid panel from Dr. Manuella Ghazi.  Past Medical History:  Diagnosis Date  . Arthritis   . CAD (coronary artery disease)    Branch vessel and moderate RCA disease 2006  . Cancer ([place]) 1990   Melanoma Lower right Leg  . Carotid artery disease ([place])   . Diabetes ([place])   . Essential hypertension   . Hyperlipidemia   . Hypothyroidism   . PAD (peripheral artery disease) ([facility])    Left common iliac stent 2004    Past Surgical History:  Procedure Laterality Date  . CATARACT EXTRACTION, BILATERAL    . CERVICAL DISC SURGERY    . COLONOSCOPY N/A 11/06/2016   Procedure: COLONOSCOPY;  Surgeon: Daneil Dolin, MD;  Location: [facility];  Service: Endoscopy;  Laterality: N/A;  7:30 AM  . COLONOSCOPY  2012   Dr. Anthony Sar: normal. reviewed reports, which states he has a history of polyps in  remote past.   . MELANOMA EXCISION     right leg, [facility] at [facility] and underwent immunotherapy  . NECK SURGERY    . REPLACEMENT TOTAL KNEE      Current Outpatient Prescriptions  Medication Sig Dispense Refill  . amLODipine (NORVASC) 2.5 MG tablet Take 1 tablet (2.5 mg total) by mouth daily. 30 tablet 6  . aspirin EC 81 MG tablet Take 81 mg by mouth daily.      Marland Kitchen atorvastatin (LIPITOR) 40 MG tablet Take 40 mg by mouth every morning.     . fluticasone (FLONASE ALLERGY RELIEF) 50 MCG/ACT nasal spray Place 1-2 sprays into both nostrils daily as needed for allergies.    Marland Kitchen levothyroxine (SYNTHROID, LEVOTHROID) 175 MCG tablet Take 175 mcg by mouth daily.      . metFORMIN (GLUCOPHAGE) 500 MG tablet Take 500 mg by mouth daily.     . methocarbamol (ROBAXIN) 500 MG tablet Take 500 mg by mouth 2 (two) times daily as needed (for leg cramps).    . metoprolol tartrate (LOPRESSOR) 25 MG tablet Take 25 mg by mouth 2 (two) times daily.      . mometasone (ELOCON) 0.1 % ointment Apply 1 application topically 2 (two) times daily as needed (for psorasis).    . nabumetone (RELAFEN) 500 MG tablet Take 500 mg by mouth 2 (two) times daily as needed (for inflammation).    . naproxen sodium (ANAPROX) 220 MG tablet Take 440  mg by mouth daily as needed (for pain.).    Marland Kitchen nitroGLYCERIN (NITROSTAT) 0.4 MG SL tablet Place 1 tablet (0.4 mg total) under the tongue every 5 (five) minutes as needed for chest pain. 25 tablet 3  . telmisartan-hydrochlorothiazide (MICARDIS HCT) 80-25 MG per tablet Take 1 tablet by mouth daily.       No current facility-administered medications for this visit.    Allergies:  Azithromycin and Shrimp [shellfish allergy]   Social History: The patient  reports that he quit smoking about 13 years ago. His smoking use included Cigarettes. He started smoking about 53 years ago. He has a 60.00 pack-year smoking history. He has never used smokeless tobacco. He reports that he drinks alcohol. He reports that he  does not use drugs.   ROS:  Please see the history of present illness. Otherwise, complete review of systems is positive for chronic leg pain, foot tingling.  All other systems are reviewed and negative.   Physical Exam: VS:  BP 111/68   Pulse (!) 55   Ht '5\' 6"'  (1.676 m)   Wt 186 lb (84.4 kg)   BMI 30.02 kg/m , BMI Body mass index is 30.02 kg/m.  Wt Readings from Last 3 Encounters:  12/10/16 186 lb (84.4 kg)  10/30/16 189 lb (85.7 kg)  12/11/15 187 lb (84.8 kg)    General: Overweight male, appears comfortable at rest. HEENT: Conjunctiva and lids normal, oropharynx clear. Neck: Supple, no elevated JVP, soft rightcarotid bruit, no thyromegaly. Lungs: Clear to auscultation, nonlabored breathing at rest. Cardiac: Regular rate and rhythm, no S3 or significant systolic murmur, no pericardial rub. Abdomen: Soft, nontender, bowel sounds present, no guarding or rebound. Extremities: No pitting edema, distal pulses 1-2+. Skin: Warm and dry. Musculoskeletal: No kyphosis. Neuropsychiatric: Alert and oriented x3, affect grossly appropriate.  ECG: I personally reviewed the tracing from 12/11/2015 which showed sinus bradycardia with nonspecific ST-T changes.  Recent Labwork: 11/06/2016: Hemoglobin 13.3; Platelets 99   Other Studies Reviewed Today:  Exercise Cardiolite 08/22/2014: FINDINGS: ECG: The patient was stressed according to the Bruce protocol for 2 min 48 seconds achieving a work level of 4.6 Mets. The resting heart rate of 56 beats per min rose to a maximal heart rate of 106 beats per min. This value represents 60% of the maximal, age predicted heart rate. The resting blood pressure of 102/63 rose to a maximum blood pressure of 155/65. The stress test was stopped as the patient was experiencing dizziness and shortness of breath and ultimately that test was switched to a Lexiscan.  The baseline ECG demonstrated sinus bradycardia, HR 56 bpm. There was a nonspecific T wave  abnormality at rest. With exercise, PVCs were noted as were nonspecific ST segment abnormalities. There were PVCs in recovery as well. This is a nondiagnostic ECG.  Raw data: No significant extracardiac radiotracer uptake.  Perfusion: Moderate size, moderate to severely intense, fixed defect seen in the infero apical, inferoapical septal, mid inferior and basal inferior walls. Images were better on stress when compared to rest. Regional wall motion was normal, suggesting this defect was due to soft tissue attenuation. There is no evidence of ischemia.  Wall Motion: Normal left ventricular wall motion. No left ventricular dilation.  Left Ventricular Ejection Fraction: 80 %  End diastolic volume 79 ml  End systolic volume 16 ml  IMPRESSION: 1. No reversible ischemia or infarction. Fixed inferior wall defect likely due to soft tissue attenuation given normal regional wall motion.  2. Normal left ventricular  wall motion.  3. Left ventricular ejection fraction 80%  4. Low-risk stress test findings*.  Echocardiogram 08/22/2014: Study Conclusions  - Procedure narrative: Transthoracic echocardiography. Image quality was suboptimal. The study was technically difficult, as a result of poor sound wave transmission. - Left ventricle: The cavity size was normal. Wall thickness was increased in a pattern of mild LVH. Systolic function was normal. The estimated ejection fraction was in the range of 60% to 65%. Wall motion was normal; there were no regional wall motion abnormalities. Diastolic dysfunction, grade indeterminate. - Aortic valve: Aortic valve sclerosis without stenosis. Trileaflet; mildly thickened, mildly calcified leaflets. There was trivial regurgitation. Peak velocity (S): 203 cm/s. Mean gradient (S): 9 mm Hg. Valve area (VTI): 2.47 cm^2. Valve area (Vmax): 2.65 cm^2. Valve area (Vmean): 2.01 cm^2.  Assessment and Plan:  1. CAD with  last cardiac catheterization demonstrating branch vessel disease in the diagonal and obtuse marginal distributions, also moderate RCA disease. He does not report any significant progression in angina symptoms on medical therapy and had stress testing done in 2015 which was overall low risk. We will continue with observation. ECG reviewed.  2. PAD with prior left common iliac stent intervention in 2004. Reports chronic leg pain most of which sounds like neuropathy. Last arterial Dopplers were in 2015. We will obtain a follow-up study.  3. Hyperlipidemia, on statin therapy. Requesting most recent lipid panel from Dr. Manuella Ghazi.  4. Carotid artery disease, patient thinks that he had carotid Dopplers done per [facility] this year. We will request the results.  Current medicines were reviewed with the patient today.   Orders Placed This Encounter  Procedures  . EKG 12-Lead    Disposition: Follow-up in one year, sooner if needed.  Signed, Satira Sark, MD, [facility] 12/10/2016 8:29 AM    [facility] [address], [place], [place] [address] Phone: [phone]; Fax: [phone]

## 2016-12-09 NOTE — Progress Notes (Signed)
Procedure notes from March 2012 by Dr. Anthony Sar: normal colonoscopy. History of remote colon polyps. Surveillance in 5 years.

## 2016-12-10 ENCOUNTER — Encounter: Payer: Self-pay | Admitting: Cardiology

## 2016-12-10 ENCOUNTER — Other Ambulatory Visit: Payer: Self-pay | Admitting: Cardiology

## 2016-12-10 ENCOUNTER — Ambulatory Visit (INDEPENDENT_AMBULATORY_CARE_PROVIDER_SITE_OTHER): Payer: Medicare Other | Admitting: Cardiology

## 2016-12-10 ENCOUNTER — Encounter: Payer: Self-pay | Admitting: *Deleted

## 2016-12-10 VITALS — BP 111/68 | HR 55 | Ht 66.0 in | Wt 186.0 lb

## 2016-12-10 DIAGNOSIS — M79606 Pain in leg, unspecified: Secondary | ICD-10-CM | POA: Diagnosis not present

## 2016-12-10 DIAGNOSIS — I739 Peripheral vascular disease, unspecified: Secondary | ICD-10-CM | POA: Diagnosis not present

## 2016-12-10 DIAGNOSIS — E785 Hyperlipidemia, unspecified: Secondary | ICD-10-CM | POA: Diagnosis not present

## 2016-12-10 DIAGNOSIS — I2510 Atherosclerotic heart disease of native coronary artery without angina pectoris: Secondary | ICD-10-CM | POA: Diagnosis not present

## 2016-12-10 MED ORDER — NITROGLYCERIN 0.4 MG SL SUBL: 0.4000 mg | SUBLINGUAL_TABLET | SUBLINGUAL | 3 refills | Status: DC | PRN

## 2016-12-10 NOTE — Patient Instructions (Addendum)
Medication Instructions:   Nitroglycerin refilled today.  Continue all other current medications.  Labwork: none  Testing/Procedures:  Your physician has requested that you have a lower extremity arterial duplex. During this test, ultrasound is used to evaluate arterial blood flow in the legs. Allow one hour for this exam. There are no restrictions or special instructions.  Your physician has requested that you have an ankle brachial index (ABI). During this test an ultrasound and blood pressure cuff are used to evaluate the arteries that supply the arms and legs with blood. Allow thirty minutes for this exam. There are no restrictions or special instructions.  Office will contact with results via phone or letter.    Follow-Up: Your physician wants you to follow up in:  1 year.  You will receive a reminder letter in the mail one-two months in advance.  If you don't receive a letter, please call our office to schedule the follow up appointment   Any Other Special Instructions Will Be Listed Below (If Applicable).  If you need a refill on your cardiac medications before your next appointment, please call your pharmacy.

## 2016-12-15 ENCOUNTER — Ambulatory Visit: Payer: Medicare Other

## 2016-12-15 DIAGNOSIS — I739 Peripheral vascular disease, unspecified: Secondary | ICD-10-CM

## 2016-12-15 DIAGNOSIS — R202 Paresthesia of skin: Secondary | ICD-10-CM | POA: Diagnosis not present

## 2016-12-18 ENCOUNTER — Telehealth: Payer: Self-pay | Admitting: *Deleted

## 2016-12-18 NOTE — Telephone Encounter (Signed)
-----   Message from Satira Sark, MD sent at 12/18/2016  7:45 AM EST ----- Results reviewed. Doppler studies of the legs look good, ABIs normal. This would not suggest progressive PAD. Suspect that leg pain is more likely related to neuropathy. A copy of this test should be forwarded to [facility], MD.

## 2016-12-18 NOTE — Telephone Encounter (Signed)
Patient informed and copy sent to PCP. 

## 2017-01-18 DIAGNOSIS — E7800 Pure hypercholesterolemia, unspecified: Secondary | ICD-10-CM | POA: Diagnosis not present

## 2017-01-18 DIAGNOSIS — I2510 Atherosclerotic heart disease of native coronary artery without angina pectoris: Secondary | ICD-10-CM | POA: Diagnosis not present

## 2017-01-18 DIAGNOSIS — E119 Type 2 diabetes mellitus without complications: Secondary | ICD-10-CM | POA: Diagnosis not present

## 2017-01-18 DIAGNOSIS — I10 Essential (primary) hypertension: Secondary | ICD-10-CM | POA: Diagnosis not present

## 2017-02-18 DIAGNOSIS — M4726 Other spondylosis with radiculopathy, lumbar region: Secondary | ICD-10-CM | POA: Diagnosis not present

## 2017-02-18 DIAGNOSIS — M5116 Intervertebral disc disorders with radiculopathy, lumbar region: Secondary | ICD-10-CM | POA: Diagnosis not present

## 2017-02-18 DIAGNOSIS — M5416 Radiculopathy, lumbar region: Secondary | ICD-10-CM | POA: Diagnosis not present

## 2017-02-18 DIAGNOSIS — M48061 Spinal stenosis, lumbar region without neurogenic claudication: Secondary | ICD-10-CM | POA: Diagnosis not present

## 2017-03-04 DIAGNOSIS — E119 Type 2 diabetes mellitus without complications: Secondary | ICD-10-CM | POA: Diagnosis not present

## 2017-03-04 DIAGNOSIS — I10 Essential (primary) hypertension: Secondary | ICD-10-CM | POA: Diagnosis not present

## 2017-03-04 DIAGNOSIS — E7800 Pure hypercholesterolemia, unspecified: Secondary | ICD-10-CM | POA: Diagnosis not present

## 2017-03-04 DIAGNOSIS — I2510 Atherosclerotic heart disease of native coronary artery without angina pectoris: Secondary | ICD-10-CM | POA: Diagnosis not present

## 2017-03-05 DIAGNOSIS — G8929 Other chronic pain: Secondary | ICD-10-CM | POA: Diagnosis not present

## 2017-03-05 DIAGNOSIS — Z87891 Personal history of nicotine dependence: Secondary | ICD-10-CM | POA: Diagnosis not present

## 2017-03-05 DIAGNOSIS — E782 Mixed hyperlipidemia: Secondary | ICD-10-CM | POA: Diagnosis not present

## 2017-03-05 DIAGNOSIS — I10 Essential (primary) hypertension: Secondary | ICD-10-CM | POA: Diagnosis not present

## 2017-03-05 DIAGNOSIS — E039 Hypothyroidism, unspecified: Secondary | ICD-10-CM | POA: Diagnosis not present

## 2017-03-05 DIAGNOSIS — N400 Enlarged prostate without lower urinary tract symptoms: Secondary | ICD-10-CM | POA: Diagnosis not present

## 2017-03-05 DIAGNOSIS — M549 Dorsalgia, unspecified: Secondary | ICD-10-CM | POA: Diagnosis not present

## 2017-03-05 DIAGNOSIS — E1142 Type 2 diabetes mellitus with diabetic polyneuropathy: Secondary | ICD-10-CM | POA: Diagnosis not present

## 2017-03-05 DIAGNOSIS — I739 Peripheral vascular disease, unspecified: Secondary | ICD-10-CM | POA: Diagnosis not present

## 2017-03-05 DIAGNOSIS — Z299 Encounter for prophylactic measures, unspecified: Secondary | ICD-10-CM | POA: Diagnosis not present

## 2017-03-05 DIAGNOSIS — M109 Gout, unspecified: Secondary | ICD-10-CM | POA: Diagnosis not present

## 2017-03-24 DIAGNOSIS — I739 Peripheral vascular disease, unspecified: Secondary | ICD-10-CM | POA: Diagnosis not present

## 2017-03-24 DIAGNOSIS — E782 Mixed hyperlipidemia: Secondary | ICD-10-CM | POA: Diagnosis not present

## 2017-03-24 DIAGNOSIS — I10 Essential (primary) hypertension: Secondary | ICD-10-CM | POA: Diagnosis not present

## 2017-03-24 DIAGNOSIS — E039 Hypothyroidism, unspecified: Secondary | ICD-10-CM | POA: Diagnosis not present

## 2017-03-24 DIAGNOSIS — M1711 Unilateral primary osteoarthritis, right knee: Secondary | ICD-10-CM | POA: Diagnosis not present

## 2017-03-24 DIAGNOSIS — R011 Cardiac murmur, unspecified: Secondary | ICD-10-CM | POA: Diagnosis not present

## 2017-03-24 DIAGNOSIS — Z96651 Presence of right artificial knee joint: Secondary | ICD-10-CM | POA: Diagnosis not present

## 2017-03-24 DIAGNOSIS — E119 Type 2 diabetes mellitus without complications: Secondary | ICD-10-CM | POA: Diagnosis not present

## 2017-04-19 DIAGNOSIS — L409 Psoriasis, unspecified: Secondary | ICD-10-CM | POA: Diagnosis not present

## 2017-04-19 DIAGNOSIS — Z85820 Personal history of malignant melanoma of skin: Secondary | ICD-10-CM | POA: Diagnosis not present

## 2017-04-19 DIAGNOSIS — L570 Actinic keratosis: Secondary | ICD-10-CM | POA: Diagnosis not present

## 2017-04-23 DIAGNOSIS — H52209 Unspecified astigmatism, unspecified eye: Secondary | ICD-10-CM | POA: Diagnosis not present

## 2017-04-23 DIAGNOSIS — E119 Type 2 diabetes mellitus without complications: Secondary | ICD-10-CM | POA: Diagnosis not present

## 2017-04-23 DIAGNOSIS — Z961 Presence of intraocular lens: Secondary | ICD-10-CM | POA: Diagnosis not present

## 2017-04-23 DIAGNOSIS — H1045 Other chronic allergic conjunctivitis: Secondary | ICD-10-CM | POA: Diagnosis not present

## 2017-06-04 DIAGNOSIS — Z6828 Body mass index (BMI) 28.0-28.9, adult: Secondary | ICD-10-CM | POA: Diagnosis not present

## 2017-06-04 DIAGNOSIS — M48062 Spinal stenosis, lumbar region with neurogenic claudication: Secondary | ICD-10-CM | POA: Diagnosis not present

## 2017-06-04 DIAGNOSIS — M5136 Other intervertebral disc degeneration, lumbar region: Secondary | ICD-10-CM | POA: Diagnosis not present

## 2017-06-04 DIAGNOSIS — M4726 Other spondylosis with radiculopathy, lumbar region: Secondary | ICD-10-CM | POA: Diagnosis not present

## 2017-06-04 DIAGNOSIS — M4316 Spondylolisthesis, lumbar region: Secondary | ICD-10-CM | POA: Diagnosis not present

## 2017-06-09 DIAGNOSIS — I10 Essential (primary) hypertension: Secondary | ICD-10-CM | POA: Diagnosis not present

## 2017-06-09 DIAGNOSIS — E039 Hypothyroidism, unspecified: Secondary | ICD-10-CM | POA: Diagnosis not present

## 2017-06-09 DIAGNOSIS — E782 Mixed hyperlipidemia: Secondary | ICD-10-CM | POA: Diagnosis not present

## 2017-06-09 DIAGNOSIS — E119 Type 2 diabetes mellitus without complications: Secondary | ICD-10-CM | POA: Diagnosis not present

## 2017-06-10 DIAGNOSIS — M48061 Spinal stenosis, lumbar region without neurogenic claudication: Secondary | ICD-10-CM | POA: Diagnosis not present

## 2017-06-10 DIAGNOSIS — M5136 Other intervertebral disc degeneration, lumbar region: Secondary | ICD-10-CM | POA: Diagnosis not present

## 2017-06-10 DIAGNOSIS — M4726 Other spondylosis with radiculopathy, lumbar region: Secondary | ICD-10-CM | POA: Diagnosis not present

## 2017-06-14 DIAGNOSIS — E039 Hypothyroidism, unspecified: Secondary | ICD-10-CM | POA: Diagnosis not present

## 2017-06-14 DIAGNOSIS — Z6828 Body mass index (BMI) 28.0-28.9, adult: Secondary | ICD-10-CM | POA: Diagnosis not present

## 2017-06-14 DIAGNOSIS — I739 Peripheral vascular disease, unspecified: Secondary | ICD-10-CM | POA: Diagnosis not present

## 2017-06-14 DIAGNOSIS — E119 Type 2 diabetes mellitus without complications: Secondary | ICD-10-CM | POA: Diagnosis not present

## 2017-06-14 DIAGNOSIS — E782 Mixed hyperlipidemia: Secondary | ICD-10-CM | POA: Diagnosis not present

## 2017-06-14 DIAGNOSIS — R011 Cardiac murmur, unspecified: Secondary | ICD-10-CM | POA: Diagnosis not present

## 2017-06-14 DIAGNOSIS — I10 Essential (primary) hypertension: Secondary | ICD-10-CM | POA: Diagnosis not present

## 2017-06-14 DIAGNOSIS — Z1389 Encounter for screening for other disorder: Secondary | ICD-10-CM | POA: Diagnosis not present

## 2017-07-09 DIAGNOSIS — H10013 Acute follicular conjunctivitis, bilateral: Secondary | ICD-10-CM | POA: Diagnosis not present

## 2017-07-09 DIAGNOSIS — T1511XA Foreign body in conjunctival sac, right eye, initial encounter: Secondary | ICD-10-CM | POA: Diagnosis not present

## 2017-08-16 DIAGNOSIS — M7552 Bursitis of left shoulder: Secondary | ICD-10-CM | POA: Diagnosis not present

## 2017-08-16 DIAGNOSIS — M19012 Primary osteoarthritis, left shoulder: Secondary | ICD-10-CM | POA: Diagnosis not present

## 2017-08-16 DIAGNOSIS — Z6828 Body mass index (BMI) 28.0-28.9, adult: Secondary | ICD-10-CM | POA: Diagnosis not present

## 2017-09-10 DIAGNOSIS — M546 Pain in thoracic spine: Secondary | ICD-10-CM | POA: Diagnosis not present

## 2017-09-10 DIAGNOSIS — M4726 Other spondylosis with radiculopathy, lumbar region: Secondary | ICD-10-CM | POA: Diagnosis not present

## 2017-09-10 DIAGNOSIS — M4316 Spondylolisthesis, lumbar region: Secondary | ICD-10-CM | POA: Diagnosis not present

## 2017-09-10 DIAGNOSIS — M48062 Spinal stenosis, lumbar region with neurogenic claudication: Secondary | ICD-10-CM | POA: Diagnosis not present

## 2017-09-10 DIAGNOSIS — M5136 Other intervertebral disc degeneration, lumbar region: Secondary | ICD-10-CM | POA: Diagnosis not present

## 2017-09-23 DIAGNOSIS — M4726 Other spondylosis with radiculopathy, lumbar region: Secondary | ICD-10-CM | POA: Diagnosis not present

## 2017-09-23 DIAGNOSIS — M5116 Intervertebral disc disorders with radiculopathy, lumbar region: Secondary | ICD-10-CM | POA: Diagnosis not present

## 2017-09-23 DIAGNOSIS — M48061 Spinal stenosis, lumbar region without neurogenic claudication: Secondary | ICD-10-CM | POA: Diagnosis not present

## 2017-10-15 DIAGNOSIS — I6523 Occlusion and stenosis of bilateral carotid arteries: Secondary | ICD-10-CM | POA: Diagnosis not present

## 2017-10-15 DIAGNOSIS — M19012 Primary osteoarthritis, left shoulder: Secondary | ICD-10-CM | POA: Diagnosis not present

## 2017-10-15 DIAGNOSIS — I10 Essential (primary) hypertension: Secondary | ICD-10-CM | POA: Diagnosis not present

## 2017-10-15 DIAGNOSIS — E119 Type 2 diabetes mellitus without complications: Secondary | ICD-10-CM | POA: Diagnosis not present

## 2017-10-15 DIAGNOSIS — Z6827 Body mass index (BMI) 27.0-27.9, adult: Secondary | ICD-10-CM | POA: Diagnosis not present

## 2017-10-15 DIAGNOSIS — E782 Mixed hyperlipidemia: Secondary | ICD-10-CM | POA: Diagnosis not present

## 2017-10-15 DIAGNOSIS — M7552 Bursitis of left shoulder: Secondary | ICD-10-CM | POA: Diagnosis not present

## 2017-10-15 DIAGNOSIS — M10071 Idiopathic gout, right ankle and foot: Secondary | ICD-10-CM | POA: Diagnosis not present

## 2017-10-16 DIAGNOSIS — Z23 Encounter for immunization: Secondary | ICD-10-CM | POA: Diagnosis not present

## 2017-11-15 DIAGNOSIS — I6522 Occlusion and stenosis of left carotid artery: Secondary | ICD-10-CM | POA: Diagnosis not present

## 2017-11-15 DIAGNOSIS — I6523 Occlusion and stenosis of bilateral carotid arteries: Secondary | ICD-10-CM | POA: Diagnosis not present

## 2017-11-17 DIAGNOSIS — M10072 Idiopathic gout, left ankle and foot: Secondary | ICD-10-CM | POA: Diagnosis not present

## 2017-11-17 DIAGNOSIS — Z6828 Body mass index (BMI) 28.0-28.9, adult: Secondary | ICD-10-CM | POA: Diagnosis not present

## 2017-11-17 DIAGNOSIS — I6523 Occlusion and stenosis of bilateral carotid arteries: Secondary | ICD-10-CM | POA: Diagnosis not present

## 2017-11-26 DIAGNOSIS — E119 Type 2 diabetes mellitus without complications: Secondary | ICD-10-CM | POA: Diagnosis not present

## 2017-11-26 DIAGNOSIS — Z87891 Personal history of nicotine dependence: Secondary | ICD-10-CM | POA: Diagnosis not present

## 2017-11-26 DIAGNOSIS — E782 Mixed hyperlipidemia: Secondary | ICD-10-CM | POA: Diagnosis not present

## 2017-11-26 DIAGNOSIS — E039 Hypothyroidism, unspecified: Secondary | ICD-10-CM | POA: Diagnosis not present

## 2017-11-26 DIAGNOSIS — I10 Essential (primary) hypertension: Secondary | ICD-10-CM | POA: Diagnosis not present

## 2017-11-26 DIAGNOSIS — R011 Cardiac murmur, unspecified: Secondary | ICD-10-CM | POA: Diagnosis not present

## 2017-11-26 DIAGNOSIS — M19012 Primary osteoarthritis, left shoulder: Secondary | ICD-10-CM | POA: Diagnosis not present

## 2017-11-26 DIAGNOSIS — N400 Enlarged prostate without lower urinary tract symptoms: Secondary | ICD-10-CM | POA: Diagnosis not present

## 2017-11-29 DIAGNOSIS — I10 Essential (primary) hypertension: Secondary | ICD-10-CM | POA: Diagnosis not present

## 2017-11-29 DIAGNOSIS — M1711 Unilateral primary osteoarthritis, right knee: Secondary | ICD-10-CM | POA: Diagnosis not present

## 2017-11-29 DIAGNOSIS — E782 Mixed hyperlipidemia: Secondary | ICD-10-CM | POA: Diagnosis not present

## 2017-11-29 DIAGNOSIS — I6523 Occlusion and stenosis of bilateral carotid arteries: Secondary | ICD-10-CM | POA: Diagnosis not present

## 2017-11-29 DIAGNOSIS — I739 Peripheral vascular disease, unspecified: Secondary | ICD-10-CM | POA: Diagnosis not present

## 2017-11-29 DIAGNOSIS — E119 Type 2 diabetes mellitus without complications: Secondary | ICD-10-CM | POA: Diagnosis not present

## 2017-11-29 DIAGNOSIS — M19012 Primary osteoarthritis, left shoulder: Secondary | ICD-10-CM | POA: Diagnosis not present

## 2017-11-29 DIAGNOSIS — Z96651 Presence of right artificial knee joint: Secondary | ICD-10-CM | POA: Diagnosis not present

## 2017-11-29 DIAGNOSIS — E039 Hypothyroidism, unspecified: Secondary | ICD-10-CM | POA: Diagnosis not present

## 2017-11-29 DIAGNOSIS — Z23 Encounter for immunization: Secondary | ICD-10-CM | POA: Diagnosis not present

## 2017-11-29 DIAGNOSIS — M7552 Bursitis of left shoulder: Secondary | ICD-10-CM | POA: Diagnosis not present

## 2017-11-29 DIAGNOSIS — Z0001 Encounter for general adult medical examination with abnormal findings: Secondary | ICD-10-CM | POA: Diagnosis not present

## 2017-11-30 DIAGNOSIS — M25812 Other specified joint disorders, left shoulder: Secondary | ICD-10-CM | POA: Diagnosis not present

## 2017-11-30 DIAGNOSIS — M19012 Primary osteoarthritis, left shoulder: Secondary | ICD-10-CM | POA: Diagnosis not present

## 2017-11-30 DIAGNOSIS — M25512 Pain in left shoulder: Secondary | ICD-10-CM | POA: Diagnosis not present

## 2017-12-02 DIAGNOSIS — M19012 Primary osteoarthritis, left shoulder: Secondary | ICD-10-CM | POA: Diagnosis not present

## 2017-12-02 DIAGNOSIS — M25812 Other specified joint disorders, left shoulder: Secondary | ICD-10-CM | POA: Diagnosis not present

## 2017-12-02 DIAGNOSIS — M25512 Pain in left shoulder: Secondary | ICD-10-CM | POA: Diagnosis not present

## 2017-12-06 DIAGNOSIS — I6523 Occlusion and stenosis of bilateral carotid arteries: Secondary | ICD-10-CM | POA: Diagnosis not present

## 2017-12-06 DIAGNOSIS — R011 Cardiac murmur, unspecified: Secondary | ICD-10-CM | POA: Diagnosis not present

## 2017-12-07 DIAGNOSIS — M25812 Other specified joint disorders, left shoulder: Secondary | ICD-10-CM | POA: Diagnosis not present

## 2017-12-07 DIAGNOSIS — M25512 Pain in left shoulder: Secondary | ICD-10-CM | POA: Diagnosis not present

## 2017-12-07 DIAGNOSIS — M19012 Primary osteoarthritis, left shoulder: Secondary | ICD-10-CM | POA: Diagnosis not present

## 2017-12-09 DIAGNOSIS — M25512 Pain in left shoulder: Secondary | ICD-10-CM | POA: Diagnosis not present

## 2017-12-09 DIAGNOSIS — M25812 Other specified joint disorders, left shoulder: Secondary | ICD-10-CM | POA: Diagnosis not present

## 2017-12-09 DIAGNOSIS — M19012 Primary osteoarthritis, left shoulder: Secondary | ICD-10-CM | POA: Diagnosis not present

## 2017-12-10 ENCOUNTER — Ambulatory Visit (INDEPENDENT_AMBULATORY_CARE_PROVIDER_SITE_OTHER): Payer: Medicare Other | Admitting: Cardiology

## 2017-12-10 ENCOUNTER — Encounter: Payer: Self-pay | Admitting: Cardiology

## 2017-12-10 ENCOUNTER — Encounter: Payer: Self-pay | Admitting: *Deleted

## 2017-12-10 VITALS — BP 128/70 | HR 58 | Ht 68.0 in | Wt 182.0 lb

## 2017-12-10 DIAGNOSIS — I6523 Occlusion and stenosis of bilateral carotid arteries: Secondary | ICD-10-CM

## 2017-12-10 DIAGNOSIS — R011 Cardiac murmur, unspecified: Secondary | ICD-10-CM

## 2017-12-10 DIAGNOSIS — I209 Angina pectoris, unspecified: Secondary | ICD-10-CM | POA: Diagnosis not present

## 2017-12-10 DIAGNOSIS — I25119 Atherosclerotic heart disease of native coronary artery with unspecified angina pectoris: Secondary | ICD-10-CM | POA: Diagnosis not present

## 2017-12-10 DIAGNOSIS — I739 Peripheral vascular disease, unspecified: Secondary | ICD-10-CM | POA: Diagnosis not present

## 2017-12-10 DIAGNOSIS — E782 Mixed hyperlipidemia: Secondary | ICD-10-CM

## 2017-12-10 MED ORDER — ATORVASTATIN CALCIUM 20 MG PO TABS: 20.0000 mg | ORAL_TABLET | Freq: Every day | ORAL | 3 refills | Status: DC

## 2017-12-10 NOTE — Progress Notes (Signed)
Cardiology Office Note  Date: 12/10/2017   ID: CLEON SIGNORELLI, DOB 15-Jun-1946, MRN [id number]  PCP: Curlene Labrum, MD  Primary Cardiologist: Rozann Lesches, MD   Chief Complaint  Patient presents with  . Coronary Artery Disease    History of Present Illness: Christopher Randolph is a 71 y.o. male last seen in December 2017.  He presents for a routine follow-up visit.  Reports no progressive angina or nitroglycerin use on current medical regimen.  States that he remains active outdoors, has NYHA class II dyspnea, no palpitations or syncope.  Cardiac structural and ischemic testing from 2015 as outlined below.  Lower extremity arterial Dopplers and ABIs from last year were essentially normal.  He reports chronic problems with recurring leg cramps, mainly nocturnal.  He takes Flexeril for this per PCP.  We went over his medications and discussed a trial of reducing Lipitor to 20 mg daily to see if this has any positive impact.  Typically, his lipid numbers have been well controlled.  I personally reviewed his ECG today which shows sinus bradycardia.  He reports having recent blood work with Dr. Pleas Koch and also an echocardiogram at [facility] to follow-up on heart murmur.  Past Medical History:  Diagnosis Date  . Arthritis   . CAD (coronary artery disease)    Branch vessel and moderate RCA disease 2006  . Cancer ([place]) 1990   Melanoma Lower right Leg  . Carotid artery disease ([place])   . Diabetes ([place])   . Essential hypertension   . Hyperlipidemia   . Hypothyroidism   . PAD (peripheral artery disease) (HCC)    Left common iliac stent 2004    Past Surgical History:  Procedure Laterality Date  . CATARACT EXTRACTION, BILATERAL    . CERVICAL DISC SURGERY    . COLONOSCOPY N/A 11/06/2016   Procedure: COLONOSCOPY;  Surgeon: Daneil Dolin, MD;  [facility];  Service: Endoscopy;  Laterality: N/A;  7:30 AM  . COLONOSCOPY  2012   Dr. Anthony Sar: normal.  reviewed reports, which states he has a history of polyps in remote past.   . MELANOMA EXCISION     right leg, seen at [facility] and underwent immunotherapy  . NECK SURGERY    . REPLACEMENT TOTAL KNEE      Current Outpatient Medications  Medication Sig Dispense Refill  . aspirin EC 81 MG tablet Take 81 mg by mouth daily.      Marland Kitchen atorvastatin (LIPITOR) 40 MG tablet Take 40 mg by mouth every morning.     . cyclobenzaprine (FLEXERIL) 10 MG tablet Take 10 mg by mouth 3 (three) times daily as needed for muscle spasms.    . fluticasone (FLONASE ALLERGY RELIEF) 50 MCG/ACT nasal spray Place 1-2 sprays into both nostrils daily as needed for allergies.    Marland Kitchen levothyroxine (SYNTHROID, LEVOTHROID) 150 MCG tablet Take 150 mcg by mouth daily before breakfast.    . metFORMIN (GLUCOPHAGE) 500 MG tablet Take 500 mg by mouth daily.     . metoprolol tartrate (LOPRESSOR) 25 MG tablet Take 25 mg by mouth 2 (two) times daily.      . mometasone (ELOCON) 0.1 % ointment Apply 1 application topically 2 (two) times daily as needed (for psorasis).    . nabumetone (RELAFEN) 500 MG tablet Take 500 mg by mouth 2 (two) times daily as needed (for inflammation).    . naproxen sodium (ANAPROX) 220 MG tablet Take 440 mg by mouth daily as  needed (for pain.).    Marland Kitchen nitroGLYCERIN (NITROSTAT) 0.4 MG SL tablet Place 1 tablet (0.4 mg total) under the tongue every 5 (five) minutes as needed for chest pain. 25 tablet 3  . telmisartan-hydrochlorothiazide (MICARDIS HCT) 80-25 MG per tablet Take 1 tablet by mouth daily.       No current facility-administered medications for this visit.    Allergies:  Azithromycin and Shrimp [shellfish allergy]   Social History: The patient  reports that he quit smoking about 14 years ago. His smoking use included cigarettes. He started smoking about 54 years ago. He has a 60.00 pack-year smoking history. he has never used smokeless tobacco. He reports that he drinks alcohol. He reports that he does not use  drugs.   ROS:  Please see the history of present illness. Otherwise, complete review of systems is positive for recurring leg cramps.  All other systems are reviewed and negative.   Physical Exam: VS:  BP 128/70   Pulse (!) 58   Ht 5\' 8"  (1.727 m)   Wt 182 lb (82.6 kg)   SpO2 98%   BMI 27.67 kg/m , BMI Body mass index is 27.67 kg/m.  Wt Readings from Last 3 Encounters:  12/10/17 182 lb (82.6 kg)  12/10/16 186 lb (84.4 kg)  10/30/16 189 lb (85.7 kg)    General: Patient appears comfortable at rest. HEENT: Conjunctiva and lids normal, oropharynx clear. Neck: Supple, no elevated JVP, soft right carotid bruit, no thyromegaly. Lungs: Clear to auscultation, nonlabored breathing at rest. Cardiac: Regular rate and rhythm, no S3, 2/6 systolic murmur, no pericardial rub. Abdomen: Soft, nontender, bowel sounds present, no guarding or rebound. Extremities: No pitting edema, distal pulses 2+. Skin: Warm and dry. Musculoskeletal: No kyphosis. Neuropsychiatric: Alert and oriented x3, affect grossly appropriate.  ECG: I personally reviewed the tracing from 12/10/2016 which showed sinus bradycardia.  Recent Labwork:  October 2017: Cholesterol 117, triglycerides 157, HDL 37, LDL 49, TSH 0.2, BUN 14, creatinine 0.9, potassium 3.8, AST 22, ALT 26, hemoglobin 14.3, platelets 128  Other Studies Reviewed Today:  Lower extremity arterial Dopplers and ABIs 12/15/2016: Right ABI 1.1 and TBI 0.91, left ABI 1.1 and TBI 0.77.  Echocardiogram 08/22/2014: Study Conclusions  - Procedure narrative: Transthoracic echocardiography. Image quality was suboptimal. The study was technically difficult, as a result of poor sound wave transmission. - Left ventricle: The cavity size was normal. Wall thickness was increased in a pattern of mild LVH. Systolic function was normal. The estimated ejection fraction was in the range of 60% to 65%. Wall motion was normal; there were no regional wall  motion abnormalities. Diastolic dysfunction, grade indeterminate. - Aortic valve: Aortic valve sclerosis without stenosis. Trileaflet; mildly thickened, mildly calcified leaflets. There was trivial regurgitation. Peak velocity (S): 203 cm/s. Mean gradient (S): 9 mm Hg. Valve area (VTI): 2.47 cm^2. Valve area (Vmax): 2.65 cm^2. Valve area (Vmean): 2.01 cm^2.  Exercise Cardiolite 08/22/2014: IMPRESSION: 1. No reversible ischemia or infarction. Fixed inferior wall defect likely due to soft tissue attenuation given normal regional wall motion.  2. Normal left ventricular wall motion.  3. Left ventricular ejection fraction 80%  4. Low-risk stress test findings*.  Assessment and Plan:  1.  Branch vessel CAD as well as moderate RCA stenosis per prior workup, no active angina symptoms on medical therapy.  Cardiolite from 2015 was low risk and we continue with observation at this point.  ECG reviewed and stable.  2.  Hyperlipidemia, on Lipitor.  Last LDL was 49,  requesting follow-up lab work from Dr. Pleas Koch.  With his recurring leg cramps, we will cut Lipitor back to 20 mg daily to see if this makes a positive difference.  3.  PAD with previous left common iliac stent intervention in 2004.  Lower extremity arterial Dopplers from last year showed normal ABIs bilaterally.  4.  Bilateral carotid artery disease, asymptomatic.  Carotid Dopplers from November 2017 revealed 50-69% bilateral ICA stenoses.  He will need a follow-up study around the time of his next visit.  Continue aspirin and statin.  5.  Heart murmur with previously documented aortic valve sclerosis as of 2015.  Recent follow-up echocardiogram results will be obtained.  Current medicines were reviewed with the patient today.   Orders Placed This Encounter  Procedures  . EKG 12-Lead    Disposition: Follow-up in 1 year.  Signed, Satira Sark, MD, [facility] 12/10/2017 8:48 AM    [facility], [place], [place] [address] Phone: [phone]; Fax: [phone]

## 2017-12-10 NOTE — Patient Instructions (Signed)
Medication Instructions:  Your physician has recommended you make the following change in your medication:  DECREASE Lipitor to 20 mg daily  Please continue all other medications as prescribed  Labwork: NONE  Testing/Procedures: NONE  Follow-Up: Your physician wants you to follow-up in: [place] will receive a reminder letter in the mail two months in advance. If you don't receive a letter, please call our office to schedule the follow-up appointment.  Any Other Special Instructions Will Be Listed Below (If Applicable).  If you need a refill on your cardiac medications before your next appointment, please call your pharmacy.

## 2017-12-10 NOTE — Progress Notes (Signed)
Echocardiogram report received from [facility], performed December 10.  Report indicates mild LVH with LVEF 55-60%, mild left atrial enlargement, normal right ventricular contraction, mildly thickened aortic leaflets without aortic stenosis.

## 2017-12-14 DIAGNOSIS — M19012 Primary osteoarthritis, left shoulder: Secondary | ICD-10-CM | POA: Diagnosis not present

## 2017-12-14 DIAGNOSIS — M25812 Other specified joint disorders, left shoulder: Secondary | ICD-10-CM | POA: Diagnosis not present

## 2017-12-14 DIAGNOSIS — M25512 Pain in left shoulder: Secondary | ICD-10-CM | POA: Diagnosis not present

## 2017-12-16 DIAGNOSIS — M25812 Other specified joint disorders, left shoulder: Secondary | ICD-10-CM | POA: Diagnosis not present

## 2017-12-16 DIAGNOSIS — M25512 Pain in left shoulder: Secondary | ICD-10-CM | POA: Diagnosis not present

## 2017-12-16 DIAGNOSIS — M19012 Primary osteoarthritis, left shoulder: Secondary | ICD-10-CM | POA: Diagnosis not present

## 2018-02-24 DIAGNOSIS — Z79899 Other long term (current) drug therapy: Secondary | ICD-10-CM | POA: Diagnosis not present

## 2018-02-24 DIAGNOSIS — E039 Hypothyroidism, unspecified: Secondary | ICD-10-CM | POA: Diagnosis not present

## 2018-02-24 DIAGNOSIS — Z7982 Long term (current) use of aspirin: Secondary | ICD-10-CM | POA: Diagnosis not present

## 2018-02-24 DIAGNOSIS — I2510 Atherosclerotic heart disease of native coronary artery without angina pectoris: Secondary | ICD-10-CM | POA: Diagnosis not present

## 2018-02-24 DIAGNOSIS — Z23 Encounter for immunization: Secondary | ICD-10-CM | POA: Diagnosis not present

## 2018-02-24 DIAGNOSIS — S61210S Laceration without foreign body of right index finger without damage to nail, sequela: Secondary | ICD-10-CM | POA: Diagnosis not present

## 2018-02-24 DIAGNOSIS — W312XXA Contact with powered woodworking and forming machines, initial encounter: Secondary | ICD-10-CM | POA: Diagnosis not present

## 2018-02-24 DIAGNOSIS — I10 Essential (primary) hypertension: Secondary | ICD-10-CM | POA: Diagnosis not present

## 2018-02-24 DIAGNOSIS — Z7984 Long term (current) use of oral hypoglycemic drugs: Secondary | ICD-10-CM | POA: Diagnosis not present

## 2018-02-24 DIAGNOSIS — S61210A Laceration without foreign body of right index finger without damage to nail, initial encounter: Secondary | ICD-10-CM | POA: Diagnosis not present

## 2018-02-25 DIAGNOSIS — E039 Hypothyroidism, unspecified: Secondary | ICD-10-CM | POA: Diagnosis not present

## 2018-02-25 DIAGNOSIS — S61210D Laceration without foreign body of right index finger without damage to nail, subsequent encounter: Secondary | ICD-10-CM | POA: Diagnosis not present

## 2018-02-25 DIAGNOSIS — Z7984 Long term (current) use of oral hypoglycemic drugs: Secondary | ICD-10-CM | POA: Diagnosis not present

## 2018-02-25 DIAGNOSIS — Z7982 Long term (current) use of aspirin: Secondary | ICD-10-CM | POA: Diagnosis not present

## 2018-02-25 DIAGNOSIS — I10 Essential (primary) hypertension: Secondary | ICD-10-CM | POA: Diagnosis not present

## 2018-02-25 DIAGNOSIS — Z4800 Encounter for change or removal of nonsurgical wound dressing: Secondary | ICD-10-CM | POA: Diagnosis not present

## 2018-02-25 DIAGNOSIS — Z79899 Other long term (current) drug therapy: Secondary | ICD-10-CM | POA: Diagnosis not present

## 2018-02-25 DIAGNOSIS — I2510 Atherosclerotic heart disease of native coronary artery without angina pectoris: Secondary | ICD-10-CM | POA: Diagnosis not present

## 2018-02-27 DIAGNOSIS — Z4800 Encounter for change or removal of nonsurgical wound dressing: Secondary | ICD-10-CM | POA: Diagnosis not present

## 2018-02-27 DIAGNOSIS — I2510 Atherosclerotic heart disease of native coronary artery without angina pectoris: Secondary | ICD-10-CM | POA: Diagnosis not present

## 2018-02-27 DIAGNOSIS — I10 Essential (primary) hypertension: Secondary | ICD-10-CM | POA: Diagnosis not present

## 2018-02-27 DIAGNOSIS — Z7982 Long term (current) use of aspirin: Secondary | ICD-10-CM | POA: Diagnosis not present

## 2018-02-27 DIAGNOSIS — Z79899 Other long term (current) drug therapy: Secondary | ICD-10-CM | POA: Diagnosis not present

## 2018-02-27 DIAGNOSIS — E039 Hypothyroidism, unspecified: Secondary | ICD-10-CM | POA: Diagnosis not present

## 2018-02-27 DIAGNOSIS — Z7984 Long term (current) use of oral hypoglycemic drugs: Secondary | ICD-10-CM | POA: Diagnosis not present

## 2018-02-27 DIAGNOSIS — S61219D Laceration without foreign body of unspecified finger without damage to nail, subsequent encounter: Secondary | ICD-10-CM | POA: Diagnosis not present

## 2018-03-01 DIAGNOSIS — E119 Type 2 diabetes mellitus without complications: Secondary | ICD-10-CM | POA: Diagnosis not present

## 2018-03-01 DIAGNOSIS — E039 Hypothyroidism, unspecified: Secondary | ICD-10-CM | POA: Diagnosis not present

## 2018-03-01 DIAGNOSIS — I10 Essential (primary) hypertension: Secondary | ICD-10-CM | POA: Diagnosis not present

## 2018-03-01 DIAGNOSIS — R011 Cardiac murmur, unspecified: Secondary | ICD-10-CM | POA: Diagnosis not present

## 2018-03-01 DIAGNOSIS — E782 Mixed hyperlipidemia: Secondary | ICD-10-CM | POA: Diagnosis not present

## 2018-03-03 DIAGNOSIS — Z4802 Encounter for removal of sutures: Secondary | ICD-10-CM | POA: Diagnosis not present

## 2018-03-04 DIAGNOSIS — E782 Mixed hyperlipidemia: Secondary | ICD-10-CM | POA: Diagnosis not present

## 2018-03-04 DIAGNOSIS — I6523 Occlusion and stenosis of bilateral carotid arteries: Secondary | ICD-10-CM | POA: Diagnosis not present

## 2018-03-04 DIAGNOSIS — Z6829 Body mass index (BMI) 29.0-29.9, adult: Secondary | ICD-10-CM | POA: Diagnosis not present

## 2018-03-04 DIAGNOSIS — I10 Essential (primary) hypertension: Secondary | ICD-10-CM | POA: Diagnosis not present

## 2018-03-04 DIAGNOSIS — M1711 Unilateral primary osteoarthritis, right knee: Secondary | ICD-10-CM | POA: Diagnosis not present

## 2018-03-04 DIAGNOSIS — E039 Hypothyroidism, unspecified: Secondary | ICD-10-CM | POA: Diagnosis not present

## 2018-03-04 DIAGNOSIS — E119 Type 2 diabetes mellitus without complications: Secondary | ICD-10-CM | POA: Diagnosis not present

## 2018-03-04 DIAGNOSIS — I739 Peripheral vascular disease, unspecified: Secondary | ICD-10-CM | POA: Diagnosis not present

## 2018-03-11 DIAGNOSIS — M5136 Other intervertebral disc degeneration, lumbar region: Secondary | ICD-10-CM | POA: Diagnosis not present

## 2018-03-11 DIAGNOSIS — M5440 Lumbago with sciatica, unspecified side: Secondary | ICD-10-CM | POA: Diagnosis not present

## 2018-03-11 DIAGNOSIS — M4316 Spondylolisthesis, lumbar region: Secondary | ICD-10-CM | POA: Diagnosis not present

## 2018-03-11 DIAGNOSIS — M4726 Other spondylosis with radiculopathy, lumbar region: Secondary | ICD-10-CM | POA: Diagnosis not present

## 2018-03-11 DIAGNOSIS — M48062 Spinal stenosis, lumbar region with neurogenic claudication: Secondary | ICD-10-CM | POA: Diagnosis not present

## 2018-04-19 DIAGNOSIS — L570 Actinic keratosis: Secondary | ICD-10-CM | POA: Diagnosis not present

## 2018-04-19 DIAGNOSIS — L409 Psoriasis, unspecified: Secondary | ICD-10-CM | POA: Diagnosis not present

## 2018-04-19 DIAGNOSIS — Z85820 Personal history of malignant melanoma of skin: Secondary | ICD-10-CM | POA: Diagnosis not present

## 2018-04-22 DIAGNOSIS — H1045 Other chronic allergic conjunctivitis: Secondary | ICD-10-CM | POA: Diagnosis not present

## 2018-04-22 DIAGNOSIS — H52209 Unspecified astigmatism, unspecified eye: Secondary | ICD-10-CM | POA: Diagnosis not present

## 2018-04-22 DIAGNOSIS — E119 Type 2 diabetes mellitus without complications: Secondary | ICD-10-CM | POA: Diagnosis not present

## 2018-04-22 DIAGNOSIS — Z961 Presence of intraocular lens: Secondary | ICD-10-CM | POA: Diagnosis not present

## 2018-05-17 DIAGNOSIS — H4312 Vitreous hemorrhage, left eye: Secondary | ICD-10-CM | POA: Diagnosis not present

## 2018-05-17 NOTE — Progress Notes (Addendum)
[facility] Note  05/18/2018     CHIEF COMPLAINT Patient presents for Retina Evaluation   HISTORY OF PRESENT ILLNESS: Christopher Randolph is a 72 y.o. male who presents to the clinic today for:   Pt saw Dr. Dianne Randolph yesterday because his VA was cloudy and foggy and he was unable to see details, Dr. Dianne Randolph told the pt that there was so much blood in his eye he was unable to see in the back, pt also states that pressure OS was 28 when he went into the office, so they gave him gtts to bring it down And by the time he left the pressure was 14, VA was 20/70 yesterday, 20/20 today, pt thinks [employer] did his cataract sx, but his unsure when, pt states he had routine exam in April with Dr. Vicie Randolph and was told his ducts were blocked  HPI    Retina Evaluation    In left eye.  This started 2 days ago.  Duration of 2 days.  Associated Symptoms Floaters.  Context:  distance vision, mid-range vision and near vision.  Treatments tried include eye drops.  Response to treatment was significant improvement.  I, the attending physician,  performed the HPI with the patient and updated documentation appropriately.          Comments    72 y/o male pt referred by Dr. Dianne Randolph for eval of VH OS.  Symptoms began about 2 days ago.  Was seen by Dr. Dianne Randolph yesterday.  Vision good OD.  Vision was very hazy OS yesterday, but has cleared up significantly today.  Denies pain, flashes, but reports floaters and minor irritation OS.  Dr. Dianne Randolph gave him several drops of Combigan OS yesterday, and pt used 1 gtt of Combigan OS last night, and 1 gtt of Combigan OS this morning.  BS this morning was 166, and A1C last month was 6.1.       Last edited by [name], MD on 05/18/2018  2:44 PM. (History)      Referring physician: Myrtha Randolph, [place] [address]  HISTORICAL INFORMATION:   Selected notes from the MEDICAL RECORD NUMBER  Referred by Dr. Vicie Randolph for concern of vitreous hemorrhage LEE: 05.21.19 [name]) [BCVA: OD: OS: ] Ocular Hx-Psedophakia OU,  PMH-DM (on Metformin), Arthritis     CURRENT MEDICATIONS: Current Outpatient Medications (Ophthalmic Drugs)  Medication Sig  . Loteprednol Etabonate (INVELTYS) 1 % SUSP Place 1 drop into the left eye 4 (four) times daily.   No current facility-administered medications for this visit.  (Ophthalmic Drugs)   Current Outpatient Medications (Other)  Medication Sig  . aspirin EC 81 MG tablet Take 81 mg by mouth daily.    Marland Kitchen atorvastatin (LIPITOR) 20 MG tablet Take 20 mg by mouth daily.  Marland Kitchen levothyroxine (SYNTHROID, LEVOTHROID) 150 MCG tablet Take 150 mcg by mouth daily before breakfast.  . metFORMIN (GLUCOPHAGE) 500 MG tablet Take 500 mg by mouth daily.   . metoprolol tartrate (LOPRESSOR) 25 MG tablet Take 25 mg by mouth 2 (two) times daily.    . nabumetone (RELAFEN) 500 MG tablet Take 500 mg by mouth 2 (two) times daily as needed (for inflammation).  Marland Kitchen telmisartan-hydrochlorothiazide (MICARDIS HCT) 80-25 MG per tablet Take 1 tablet by mouth daily.    Marland Kitchen atorvastatin (LIPITOR) 20 MG tablet Take 1 tablet (20 mg total) by mouth daily.  . cyclobenzaprine (FLEXERIL) 10 MG tablet Take 10 mg by mouth  3 (three) times daily as needed for muscle spasms.  . fluticasone (FLONASE ALLERGY RELIEF) 50 MCG/ACT nasal spray Place 1-2 sprays into both nostrils daily as needed for allergies.  . mometasone (ELOCON) 0.1 % ointment Apply 1 application topically 2 (two) times daily as needed (for psorasis).  . naproxen sodium (ANAPROX) 220 MG tablet Take 440 mg by mouth daily as needed (for pain.).  Marland Kitchen nitroGLYCERIN (NITROSTAT) 0.4 MG SL tablet Place 1 tablet (0.4 mg total) under the tongue every 5 (five) minutes as needed for chest pain.   No current facility-administered medications for this visit.  (Other)      REVIEW OF SYSTEMS: ROS    Positive for: Eyes   Negative for:  Constitutional, Gastrointestinal, Neurological, Skin, Genitourinary, Musculoskeletal, HENT, Endocrine, Cardiovascular, Respiratory, Psychiatric, Allergic/Imm, Heme/Lymph   Last edited by Matthew Folks, COA on 05/18/2018  8:48 AM. (History)       ALLERGIES Allergies  Allergen Reactions  . Azithromycin Swelling  . Shrimp [Shellfish Allergy] Other (See Comments)    Gout flares    PAST MEDICAL HISTORY Past Medical History:  Diagnosis Date  . Arthritis   . CAD (coronary artery disease)    Branch vessel and moderate RCA disease 2006  . Cancer ([place]) 1990   Melanoma Lower right Leg  . Carotid artery disease ([place])   . Diabetes ([place])   . Essential hypertension   . Hyperlipidemia   . Hypothyroidism   . PAD (peripheral artery disease) (HCC)    Left common iliac stent 2004   Past Surgical History:  Procedure Laterality Date  . CATARACT EXTRACTION Bilateral   . CATARACT EXTRACTION, BILATERAL    . CERVICAL DISC SURGERY    . COLONOSCOPY N/A 11/06/2016   Procedure: COLONOSCOPY;  Surgeon: Daneil Dolin, MD;  [facility];  Service: Endoscopy;  Laterality: N/A;  7:30 AM  . COLONOSCOPY  2012   Dr. Anthony Sar: normal. reviewed reports, which states he has a history of polyps in remote past.   . MELANOMA EXCISION     right leg, seen at [facility] and underwent immunotherapy  . NECK SURGERY    . REPLACEMENT TOTAL KNEE      FAMILY HISTORY Family History  Problem Relation Age of Onset  . Heart disease Mother   . Heart attack Mother   . Colon polyps Mother   . Heart attack Maternal Grandmother   . Heart attack Maternal Grandfather   . Colon polyps Brother   . Colon cancer Neg Hx     SOCIAL HISTORY Social History   Tobacco Use  . Smoking status: Former Smoker    Packs/day: 1.50    Years: 40.00    Pack years: 60.00    Types: Cigarettes    Start date: 11/04/1963    Last attempt to quit: 10/29/2003    Years since quitting: 14.5  . Smokeless tobacco: Never Used  Substance  Use Topics  . Alcohol use: Yes    Alcohol/week: 0.0 oz    Comment: one miller lite daily   . Drug use: No         OPHTHALMIC EXAM:  Base Eye Exam    Visual Acuity (Snellen - Linear)      Right Left   Dist cc 20/20 20/20 -2   Correction:  Glasses       Tonometry (Tonopen, 8:50 AM)      Right Left   Pressure 12 13       Pupils  Dark Light Shape React APD   Right 3.5 2.5 Round Brisk None   Left 3.5 2.5 Round Brisk None       Visual Fields (Counting fingers)      Left Right    Full Full       Extraocular Movement      Right Left    Full, Ortho Full, Ortho       Neuro/Psych    Oriented x3:  Yes   Mood/Affect:  Normal       Dilation    Right eye:  1.0% Mydriacyl, 2.5% Phenylephrine @ 8:50 AM        Slit Lamp and Fundus Exam    Slit Lamp Exam      Right Left   Lids/Lashes Dermatochalasis - upper lid, Telangiectasia, mild Meibomian gland dysfunction Dermatochalasis - upper lid, Meibomian gland dysfunction   Conjunctiva/Sclera superior and temporal Pinguecula superior and temporal Pinguecula   Cornea mild arcus mild arcus   Anterior Chamber Deep and quiet Deep , 3-4+ Cell and pigment   Iris No NVI, Round and dilated No NVI, moderately dilated to 4, Transillumination defects from 0130-0200 with IOL haptic visible within   Lens Posterior chamber intraocular lens in good position Posterior chamber intraocular lens in good position   Vitreous vitreous syneresis Vitreous syneresis; no heme       Fundus Exam      Right Left   Disc Pink and Sharp Pink and Sharp   C/D Ratio 0.2 0.2   Macula mild RPE mottling, No heme or edema Flat, mild RPE mottling No heme or edema   Vessels normal, mildly Tortuous mildly Tortuous   Periphery Attached, round focal area of CR atrophy at 0630 mid zone Attached        Refraction    Wearing Rx      Sphere Cylinder Axis   Right +0.50 Sphere    Left -0.25 +0.25 037   Type:  Trifocal       Manifest Refraction      Sphere  Cylinder Axis Dist VA   Right +0.50 Sphere  20/20   Left [place] +0.25 035 20/20          IMAGING AND PROCEDURES  Imaging and Procedures for @TODAY @  OCT, Retina - OU - Both Eyes       Right Eye Quality was good. Central Foveal Thickness: 286. Progression has no prior data. Findings include normal foveal contour, no SRF, no IRF, vitreomacular adhesion .   Left Eye Quality was good. Central Foveal Thickness: 286. Progression has no prior data. Findings include normal foveal contour, no IRF, no SRF, vitreomacular adhesion .   Notes *Images captured and stored on drive  Diagnosis / Impression:  NFP, no IRF, no SRF OU   Clinical management:  See below  Abbreviations: NFP - Normal foveal profile. CME - cystoid macular edema. PED - pigment epithelial detachment. IRF - intraretinal fluid. SRF - subretinal fluid. EZ - ellipsoid zone. ERM - epiretinal membrane. ORA - outer retinal atrophy. ORT - outer retinal tubulation. 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 Retinal edema H35.81 OCT, Retina - OU - Both Eyes  4. Pseudophakia of both eyes Z96.1     1,2. UGH Syndrome OS  - 3  piece PCIOL OS centered, but iris has large TID in sup temp quadrant with IOL haptic within area  - 3-4+ cell and pigment OS  - IOP controlled today on [place] given by Dr. Vicie Randolph  - discussed findings, prognosis and treatment options, possible surgery down the road  - recommend starting Inveltys QID OS and Cyclopentolate BID OS; sample of Inveltys given  - cont Combigan BID OS  - f/u in 1 wk for IOP and AC check--Pt is coming back on 05.28.19  3. No retinal edema on exam or OCT  4. Pseudophakia OU  - s/p CE/IOL OU -- pt can't remember dates, one eye by [facility], other eye by [facility]  - likely UGH OS as above  - PCIOL OD appears to be in good  position  - monitor   Ophthalmic Meds Ordered this visit:  Meds ordered this encounter  Medications  . Loteprednol Etabonate (INVELTYS) 1 % SUSP    Sig: Place 1 drop into the left eye 4 (four) times daily.    Dispense:  2.8 mL    Refill:  0       Return for 6 days (05.28.19).  There are no Patient Instructions on file for this visit.   Explained the diagnoses, plan, and follow up with the patient and they expressed understanding.  Patient expressed understanding of the importance of proper follow up care.   This document serves as a record of services personally performed by Gardiner Sleeper, MD, PhD. It was created on their behalf by Ernest Mallick, OA, an ophthalmic assistant. The creation of this record is the provider's dictation and/or activities during the visit.    Electronically signed by: Ernest Mallick, OA  05/17/2018 11:05 AM   Gardiner Sleeper, M.D., Ph.D. Diseases & Surgery of the Retina and Vitreous [employer]   I have reviewed the above documentation for accuracy and completeness, and I agree with the above. Gardiner Sleeper, M.D., Ph.D. 05/25/18 11:05 AM    Abbreviations: M myopia (nearsighted); A astigmatism; H hyperopia (farsighted); P presbyopia; Mrx spectacle prescription;  CTL contact lenses; OD right eye; OS left eye; OU both eyes  XT exotropia; ET esotropia; PEK punctate epithelial keratitis; PEE punctate epithelial erosions; DES dry eye syndrome; MGD meibomian gland dysfunction; ATs artificial tears; PFAT's preservative free artificial tears; [place] nuclear sclerotic cataract; PSC posterior subcapsular cataract; ERM epi-retinal membrane; PVD posterior vitreous detachment; RD retinal detachment; DM diabetes mellitus; DR diabetic retinopathy; NPDR non-proliferative diabetic retinopathy; PDR proliferative diabetic retinopathy; CSME clinically significant macular edema; DME diabetic macular edema; dbh dot blot hemorrhages; CWS cotton wool spot;  POAG primary open angle glaucoma; C/D cup-to-disc ratio; HVF humphrey visual field; GVF goldmann visual field; OCT optical coherence tomography; IOP intraocular pressure; BRVO Branch retinal vein occlusion; CRVO central retinal vein occlusion; CRAO central retinal artery occlusion; BRAO branch retinal artery occlusion; RT retinal tear; SB scleral buckle; PPV pars plana vitrectomy; VH Vitreous hemorrhage; PRP panretinal laser photocoagulation; IVK intravitreal kenalog; VMT vitreomacular traction; MH Macular hole;  NVD neovascularization of the disc; NVE neovascularization elsewhere; AREDS age related eye disease study; ARMD age related macular degeneration; POAG primary open angle glaucoma; EBMD epithelial/anterior basement membrane dystrophy; ACIOL anterior chamber intraocular lens; IOL intraocular lens; PCIOL posterior chamber intraocular lens; Phaco/IOL phacoemulsification with intraocular lens placement; Bent photorefractive keratectomy; LASIK laser assisted in situ keratomileusis; HTN hypertension; DM diabetes mellitus; COPD chronic obstructive pulmonary disease

## 2018-05-18 ENCOUNTER — Encounter (INDEPENDENT_AMBULATORY_CARE_PROVIDER_SITE_OTHER): Payer: Self-pay | Admitting: Ophthalmology

## 2018-05-18 ENCOUNTER — Ambulatory Visit (INDEPENDENT_AMBULATORY_CARE_PROVIDER_SITE_OTHER): Payer: Medicare Other | Admitting: Ophthalmology

## 2018-05-18 DIAGNOSIS — H401321 Pigmentary glaucoma, left eye, mild stage: Secondary | ICD-10-CM

## 2018-05-18 DIAGNOSIS — H209 Unspecified iridocyclitis: Secondary | ICD-10-CM

## 2018-05-18 DIAGNOSIS — H3581 Retinal edema: Secondary | ICD-10-CM | POA: Diagnosis not present

## 2018-05-18 DIAGNOSIS — Z961 Presence of intraocular lens: Secondary | ICD-10-CM

## 2018-05-18 MED ORDER — LOTEPREDNOL ETABONATE 1 % OP SUSP: 1.0000 [drp] | Freq: Four times a day (QID) | OPHTHALMIC | 0 refills | Status: DC

## 2018-05-19 NOTE — Progress Notes (Addendum)
[facility] Note  05/24/2018     CHIEF COMPLAINT Patient presents for Retina Follow Up   HISTORY OF PRESENT ILLNESS: Christopher Randolph is a 72 y.o. male who presents to the clinic today for:   HPI    Retina Follow Up    Patient presents with  Other.  In left eye.  Severity is moderate.  Duration of 6 days.  Since onset it is stable.  I, the attending physician,  performed the HPI with the patient and updated documentation appropriately.          Comments    F/U UGH syndrome OS; Pt states OS VA is still slightly blurred; Pt states OS burns and "tingles"; Pt states OD is stable; Pt states he is using Inveltys OS QID, cyclo OS BID, and combigan OS BID as directed; Pt states he does not need any refills on gtts at this time;        Last edited by [name], MD on 05/24/2018 10:18 AM. (History)      Referring physician: Arminda Resides, [place] [address]  HISTORICAL INFORMATION:   Selected notes from the MEDICAL RECORD NUMBER Referred by Dr. [name] for concern of vitreous hemorrhage LEE: 05.21.19 [name]) [BCVA: OD: OS: ] Ocular Hx-Psedophakia OU,  PMH-DM (on Metformin), Arthritis     CURRENT MEDICATIONS: Current Outpatient Medications (Ophthalmic Drugs)  Medication Sig  . Loteprednol Etabonate (INVELTYS) 1 % SUSP Place 1 drop into the left eye 4 (four) times daily.   No current facility-administered medications for this visit.  (Ophthalmic Drugs)   Current Outpatient Medications (Other)  Medication Sig  . aspirin EC 81 MG tablet Take 81 mg by mouth daily.    Marland Kitchen atorvastatin (LIPITOR) 20 MG tablet Take 1 tablet (20 mg total) by mouth daily.  Marland Kitchen atorvastatin (LIPITOR) 20 MG tablet Take 20 mg by mouth daily.  . cyclobenzaprine (FLEXERIL) 10 MG tablet Take 10 mg by mouth 3 (three) times daily as needed for muscle spasms.  . fluticasone (FLONASE ALLERGY RELIEF) 50 MCG/ACT nasal spray Place 1-2 sprays into  both nostrils daily as needed for allergies.  Marland Kitchen levothyroxine (SYNTHROID, LEVOTHROID) 150 MCG tablet Take 150 mcg by mouth daily before breakfast.  . metFORMIN (GLUCOPHAGE) 500 MG tablet Take 500 mg by mouth daily.   . metoprolol tartrate (LOPRESSOR) 25 MG tablet Take 25 mg by mouth 2 (two) times daily.    . mometasone (ELOCON) 0.1 % ointment Apply 1 application topically 2 (two) times daily as needed (for psorasis).  . nabumetone (RELAFEN) 500 MG tablet Take 500 mg by mouth 2 (two) times daily as needed (for inflammation).  . naproxen sodium (ANAPROX) 220 MG tablet Take 440 mg by mouth daily as needed (for pain.).  Marland Kitchen nitroGLYCERIN (NITROSTAT) 0.4 MG SL tablet Place 1 tablet (0.4 mg total) under the tongue every 5 (five) minutes as needed for chest pain.  Marland Kitchen telmisartan-hydrochlorothiazide (MICARDIS HCT) 80-25 MG per tablet Take 1 tablet by mouth daily.     No current facility-administered medications for this visit.  (Other)      REVIEW OF SYSTEMS: ROS    Positive for: Musculoskeletal, Endocrine, Cardiovascular, Eyes   Negative for: Constitutional, Gastrointestinal, Neurological, Skin, Genitourinary, HENT, Respiratory, Psychiatric, Allergic/Imm, Heme/Lymph   Last edited by Cherrie Gauze, COA on 05/24/2018  9:04 AM. (History)       ALLERGIES Allergies  Allergen Reactions  . Azithromycin Swelling  . Shrimp [  Shellfish Allergy] Other (See Comments)    Gout flares    PAST MEDICAL HISTORY Past Medical History:  Diagnosis Date  . Arthritis   . CAD (coronary artery disease)    Branch vessel and moderate RCA disease 2006  . Cancer ([place]) 1990   Melanoma Lower right Leg  . Carotid artery disease ([place])   . Diabetes ([place])   . Essential hypertension   . Hyperlipidemia   . Hypothyroidism   . PAD (peripheral artery disease) (H[facility])    Left common iliac stent 2004   Past Surgical History:  Procedure Laterality Date  . CATARACT EXTRACTION Bilateral   . CATARACT EXTRACTION,  BILATERAL    . CERVICAL DISC SURGERY    . COLONOSCOPY N/A 11/06/2016   Procedure: COLONOSCOPY;  Surgeon: Daneil Dolin, MD;  Location: [facility];  Service: Endoscopy;  Laterality: N/A;  7:30 AM  . COLONOSCOPY  2012   Dr. Anthony Sar: normal. reviewed reports, which states he has a history of polyps in remote past.   . MELANOMA EXCISION     right leg, seen at [facility] and underwent immunotherapy  . NECK SURGERY    . REPLACEMENT TOTAL KNEE      FAMILY HISTORY Family History  Problem Relation Age of Onset  . Heart disease Mother   . Heart attack Mother   . Colon polyps Mother   . Heart attack Maternal Grandmother   . Heart attack Maternal Grandfather   . Colon polyps Brother   . Colon cancer Neg Hx     SOCIAL HISTORY Social History   Tobacco Use  . Smoking status: Former Smoker    Packs/day: 1.50    Years: 40.00    Pack years: 60.00    Types: Cigarettes    Start date: 11/04/1963    Last attempt to quit: 10/29/2003    Years since quitting: 14.5  . Smokeless tobacco: Never Used  Substance Use Topics  . Alcohol use: Yes    Alcohol/week: 0.0 oz    Comment: one miller lite daily   . Drug use: No         OPHTHALMIC EXAM:  Base Eye Exam    Visual Acuity (Snellen - Linear)      Right Left   Dist cc 20/25 20/25   Dist ph cc 20/20 20/20   Correction:  Glasses       Tonometry (Tonopen, 9:15 AM)      Right Left   Pressure 17 18       Gonioscopy ([place]ussman four mirror)      Right Left   Temporal Grade 4 Grade 4   Nasal Grade 4 Grade 4   Superior Grade 4 Grade 4   Inferior Grade 4 Grade 4  OS: inf PAS 6-7       Pupils      Dark Light Shape React APD   Right 3 2 Round Brisk None   Left 5  Round Minimal None  Pharm dilated OS       Visual Fields (Counting fingers)      Left Right    Full Full       Extraocular Movement      Right Left    Full, Ortho Full, Ortho       Neuro/Psych    Oriented x3:  Yes   Mood/Affect:  Normal       Dilation    Both  eyes:  1.0% Mydriacyl, 2.5% Phenylephrine @ 9:15 AM  Slit Lamp and Fundus Exam    Slit Lamp Exam      Right Left   Lids/Lashes Dermatochalasis - upper lid, Telangiectasia, mild Meibomian gland dysfunction Dermatochalasis - upper lid, Meibomian gland dysfunction   Conjunctiva/Sclera superior and temporal Pinguecula superior and temporal Pinguecula   Cornea mild arcus mild arcus   Anterior Chamber Deep and quiet Deep , 1-2+ Cell and pigment   Iris No NVI, Round and dilated No NVI, moderately dilated to 4, Transillumination defects from 0130-0200 with IOL haptic visible within   Lens Posterior chamber intraocular lens in good position Posterior chamber intraocular lens in good position   Vitreous vitreous syneresis Vitreous syneresis; no heme       Fundus Exam      Right Left   Disc Pink and Sharp Pink and Sharp   C/D Ratio 0.2 0.2   Macula mild RPE mottling, No heme or edema Flat, mild RPE mottling No heme or edema   Vessels normal, mildly Tortuous mildly Tortuous   Periphery Attached, round focal area of CR atrophy at 0630 mid zone Attached          IMAGING AND PROCEDURES  Imaging and Procedures for @TODAY @  OCT, Retina - OU - Both Eyes       Right Eye Quality was good. Central Foveal Thickness: 288. Progression has been stable. Findings include normal foveal contour, no SRF, no IRF, vitreomacular adhesion .   Left Eye Quality was good. Central Foveal Thickness: 278. Progression has been stable. Findings include normal foveal contour, no IRF, no SRF, vitreomacular adhesion .   Notes *Images captured and stored on drive  Diagnosis / Impression:  NFP, no IRF, no SRF OU   Clinical management:  See below  Abbreviations: NFP - Normal foveal profile. CME - cystoid macular edema. PED - pigment epithelial detachment. IRF - intraretinal fluid. SRF - subretinal fluid. EZ - ellipsoid zone. ERM - epiretinal membrane. ORA - outer retinal atrophy. ORT - outer retinal  tubulation. 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 Retinal edema H35.81 OCT, Retina - OU - Both Eyes  5. Pseudophakia of both eyes Z96.1     1-3. UGH Syndrome OS  - 3 piece PCIOL OS centered, but iris has large TID in sup temp quadrant with IOL haptic within area  - cell and pigment OS improved to 1-2+ from 3-4+  - IOP remains controlled today on [place]  - gonio shows open angles OS, mild PAS  - discussed findings, prognosis and treatment options, possible surgery down the road  - at this point appears to be responding well to medical therapy -- VA remains excellent, no pain, decreasing cell/pigment   - no indication for surgery (IOL exchange) at this point  - cont Inveltys QID OS and Cyclopentolate BID OS  - cont Combigan BID OS  - f/u 2 wks  2. No retinal edema on exam or OCT  3. Pseudophakia OU  - s/p CE/IOL OU -- pt can't remember dates, one eye by [facility], other eye by [place]  - likely UGH OS as above  - PCIOL OD appears to be in good position  - monitor   Ophthalmic Meds Ordered this visit:  No orders of the defined types were placed in this encounter.      Return in about 2 weeks (around 06/07/2018) for f/u UGH syndrome -- Dilated Exam, OCT.  There are no Patient Instructions on file for this visit.   Explained the diagnoses, plan, and follow up with the patient and they expressed understanding.  Patient expressed understanding of the importance of proper follow up care.   This document serves as a record of services personally performed by Gardiner Sleeper, MD, PhD. It was created on their behalf by Ernest Mallick, OA, an ophthalmic assistant. The creation of this record is the provider's dictation and/or activities during the visit.     Electronically signed by: Ernest Mallick, OA  05.23.2019 10:36 PM    Gardiner Sleeper, M.D., Ph.D. Diseases & Surgery of the Retina and [place] 05.28.19  I have reviewed the above documentation for accuracy and completeness, and I agree with the above. Gardiner Sleeper, M.D., Ph.D. 05/25/18 10:42 PM     Abbreviations: M myopia (nearsighted); A astigmatism; H hyperopia (farsighted); P presbyopia; Mrx spectacle prescription;  CTL contact lenses; OD right eye; OS left eye; OU both eyes  XT exotropia; ET esotropia; PEK punctate epithelial keratitis; PEE punctate epithelial erosions; DES dry eye syndrome; MGD meibomian gland dysfunction; ATs artificial tears; PFAT's preservative free artificial tears; [place] nuclear sclerotic cataract; PSC posterior subcapsular cataract; ERM epi-retinal membrane; PVD posterior vitreous detachment; RD retinal detachment; DM diabetes mellitus; DR diabetic retinopathy; NPDR non-proliferative diabetic retinopathy; PDR proliferative diabetic retinopathy; CSME clinically significant macular edema; DME diabetic macular edema; dbh dot blot hemorrhages; CWS cotton wool spot; POAG primary open angle glaucoma; C/D cup-to-disc ratio; HVF humphrey visual field; GVF goldmann visual field; OCT optical coherence tomography; IOP intraocular pressure; BRVO Branch retinal vein occlusion; CRVO central retinal vein occlusion; CRAO central retinal artery occlusion; BRAO branch retinal artery occlusion; RT retinal tear; SB scleral buckle; PPV pars plana vitrectomy; VH Vitreous hemorrhage; PRP panretinal laser photocoagulation; IVK intravitreal kenalog; VMT vitreomacular traction; MH Macular hole;  NVD neovascularization of the disc; NVE neovascularization elsewhere; AREDS age related eye disease study; ARMD age related macular degeneration; POAG primary open angle glaucoma; EBMD epithelial/anterior basement membrane dystrophy; ACIOL anterior chamber intraocular  lens; IOL intraocular lens; PCIOL posterior chamber intraocular lens; Phaco/IOL phacoemulsification with intraocular lens placement; [place] photorefractive keratectomy; LASIK laser assisted in situ keratomileusis; HTN hypertension; DM diabetes mellitus; COPD chronic obstructive pulmonary disease

## 2018-05-24 ENCOUNTER — Encounter (INDEPENDENT_AMBULATORY_CARE_PROVIDER_SITE_OTHER): Payer: Self-pay | Admitting: Ophthalmology

## 2018-05-24 ENCOUNTER — Ambulatory Visit (INDEPENDENT_AMBULATORY_CARE_PROVIDER_SITE_OTHER): Payer: Medicare Other | Admitting: Ophthalmology

## 2018-05-24 DIAGNOSIS — H3581 Retinal edema: Secondary | ICD-10-CM

## 2018-05-24 DIAGNOSIS — Z961 Presence of intraocular lens: Secondary | ICD-10-CM | POA: Diagnosis not present

## 2018-05-24 DIAGNOSIS — T8579XA Infection and inflammatory reaction due to other internal prosthetic devices, implants and grafts, initial encounter: Secondary | ICD-10-CM

## 2018-05-24 DIAGNOSIS — T85398A Other mechanical complication of other ocular prosthetic devices, implants and grafts, initial encounter: Secondary | ICD-10-CM

## 2018-05-24 DIAGNOSIS — H209 Unspecified iridocyclitis: Secondary | ICD-10-CM

## 2018-05-24 DIAGNOSIS — H4042X0 Glaucoma secondary to eye inflammation, left eye, stage unspecified: Secondary | ICD-10-CM

## 2018-05-24 DIAGNOSIS — H401321 Pigmentary glaucoma, left eye, mild stage: Secondary | ICD-10-CM

## 2018-05-25 ENCOUNTER — Encounter (INDEPENDENT_AMBULATORY_CARE_PROVIDER_SITE_OTHER): Payer: Self-pay | Admitting: Ophthalmology

## 2018-05-26 DIAGNOSIS — M48061 Spinal stenosis, lumbar region without neurogenic claudication: Secondary | ICD-10-CM | POA: Diagnosis not present

## 2018-05-26 DIAGNOSIS — M5136 Other intervertebral disc degeneration, lumbar region: Secondary | ICD-10-CM | POA: Diagnosis not present

## 2018-05-26 DIAGNOSIS — M4726 Other spondylosis with radiculopathy, lumbar region: Secondary | ICD-10-CM | POA: Diagnosis not present

## 2018-05-31 ENCOUNTER — Telehealth (INDEPENDENT_AMBULATORY_CARE_PROVIDER_SITE_OTHER): Payer: Self-pay

## 2018-05-31 MED ORDER — DORZOLAMIDE HCL-TIMOLOL MAL 2-0.5 % OP SOLN: 1.0000 [drp] | Freq: Two times a day (BID) | OPHTHALMIC | 1 refills | Status: DC

## 2018-05-31 NOTE — Telephone Encounter (Signed)
Pt called stating the medications Dr. Coralyn Pear prescribed are too expensive, pt requested another rx to be sent in; Called pt back to confirmed pharmacy and ensured he activated Inveltys coupon; Pt stated he activated coupon but was told that since his deductible has not been met that he would have to pay $145 for Inveltys and $245 for combigan; reached out to [facility] rep to see if there would be any issue with deductible being met; Inveltys rep ensured he is able to use coupon; Spoke to Dr. Coralyn Pear will send cosopt to [facility] to be used OS BID;   Catha Brow, COA

## 2018-06-06 NOTE — Progress Notes (Signed)
[facility] Note  06/07/2018     CHIEF COMPLAINT Patient presents for Retina Follow Up   HISTORY OF PRESENT ILLNESS: Christopher Randolph is a 72 y.o. male who presents to the clinic today for:   HPI    Retina Follow Up    Patient presents with  Other.  In left eye.  Severity is moderate.  Duration of 2 weeks.  Since onset it is stable.  I, the attending physician,  performed the HPI with the patient and updated documentation appropriately.          Comments    Pt presents for anterior uveitis OS F/U, pt states OS is still fuzzy and feels irritated, pt states OS is getting "'lazy" and making OD do all the work, pt is using Inveltys and Combigan as instructed, pt states he is having floaters, but denies flashes of light or wavy vision, pts BS was 157 this AM       Last edited by [name], MD on 06/07/2018 10:06 AM. (History)      Referring physician: Curlene Labrum, MD [place], [place] [address]  HISTORICAL INFORMATION:   Selected notes from the MEDICAL RECORD NUMBER Referred by Dr. [name] for concern of vitreous hemorrhage LEE: 05.21.19 [name]) [BCVA: OD: OS: ] Ocular Hx-Psedophakia OU,  PMH-DM (on Metformin), Arthritis     CURRENT MEDICATIONS: Current Outpatient Medications (Ophthalmic Drugs)  Medication Sig  . dorzolamide-timolol (COSOPT) 22.3-6.8 MG/ML ophthalmic solution Place 1 drop into the left eye 2 (two) times daily.  . Loteprednol Etabonate (INVELTYS) 1 % SUSP Place 1 drop into the left eye 4 (four) times daily.   No current facility-administered medications for this visit.  (Ophthalmic Drugs)   Current Outpatient Medications (Other)  Medication Sig  . aspirin EC 81 MG tablet Take 81 mg by mouth daily.    Marland Kitchen atorvastatin (LIPITOR) 20 MG tablet Take 1 tablet (20 mg total) by mouth daily.  Marland Kitchen atorvastatin (LIPITOR) 20 MG tablet Take 20 mg by mouth daily.  . cyclobenzaprine (FLEXERIL) 10 MG tablet Take 10 mg by  mouth 3 (three) times daily as needed for muscle spasms.  . fluticasone (FLONASE ALLERGY RELIEF) 50 MCG/ACT nasal spray Place 1-2 sprays into both nostrils daily as needed for allergies.  Marland Kitchen levothyroxine (SYNTHROID, LEVOTHROID) 150 MCG tablet Take 150 mcg by mouth daily before breakfast.  . metFORMIN (GLUCOPHAGE) 500 MG tablet Take 500 mg by mouth daily.   . metoprolol tartrate (LOPRESSOR) 25 MG tablet Take 25 mg by mouth 2 (two) times daily.    . mometasone (ELOCON) 0.1 % ointment Apply 1 application topically 2 (two) times daily as needed (for psorasis).  . nabumetone (RELAFEN) 500 MG tablet Take 500 mg by mouth 2 (two) times daily as needed (for inflammation).  . naproxen sodium (ANAPROX) 220 MG tablet Take 440 mg by mouth daily as needed (for pain.).  Marland Kitchen nitroGLYCERIN (NITROSTAT) 0.4 MG SL tablet Place 1 tablet (0.4 mg total) under the tongue every 5 (five) minutes as needed for chest pain.  Marland Kitchen telmisartan-hydrochlorothiazide (MICARDIS HCT) 80-25 MG per tablet Take 1 tablet by mouth daily.     No current facility-administered medications for this visit.  (Other)      REVIEW OF SYSTEMS: ROS    Positive for: Endocrine, Cardiovascular, Eyes   Negative for: Constitutional, Gastrointestinal, Neurological, Skin, Genitourinary, Musculoskeletal, HENT, Respiratory, Psychiatric, Allergic/Imm, Heme/Lymph   Last edited by Debbrah Alar, COT on  06/07/2018  8:52 AM. (History)       ALLERGIES Allergies  Allergen Reactions  . Azithromycin Swelling  . Shrimp [Shellfish Allergy] Other (See Comments)    Gout flares    PAST MEDICAL HISTORY Past Medical History:  Diagnosis Date  . Arthritis   . CAD (coronary artery disease)    Branch vessel and moderate RCA disease 2006  . Cancer ([place]) 1990   Melanoma Lower right Leg  . Carotid artery disease ([place])   . Diabetes ([place])   . Essential hypertension   . Hyperlipidemia   . Hypothyroidism   . PAD (peripheral artery disease) (HCC)    Left common  iliac stent 2004   Past Surgical History:  Procedure Laterality Date  . CATARACT EXTRACTION Bilateral   . CATARACT EXTRACTION, BILATERAL    . CERVICAL DISC SURGERY    . COLONOSCOPY N/A 11/06/2016   Procedure: COLONOSCOPY;  Surgeon: Daneil Dolin, MD;  [facility];  Service: Endoscopy;  Laterality: N/A;  7:30 AM  . COLONOSCOPY  2012   Dr. Anthony Sar: normal. reviewed reports, which states he has a history of polyps in remote past.   . MELANOMA EXCISION     right leg, seen at [facility] and underwent immunotherapy  . NECK SURGERY    . REPLACEMENT TOTAL KNEE      FAMILY HISTORY Family History  Problem Relation Age of Onset  . Heart disease Mother   . Heart attack Mother   . Colon polyps Mother   . Heart attack Maternal Grandmother   . Heart attack Maternal Grandfather   . Colon polyps Brother   . Colon cancer Neg Hx     SOCIAL HISTORY Social History   Tobacco Use  . Smoking status: Former Smoker    Packs/day: 1.50    Years: 40.00    Pack years: 60.00    Types: Cigarettes    Start date: 11/04/1963    Last attempt to quit: 10/29/2003    Years since quitting: 14.6  . Smokeless tobacco: Never Used  Substance Use Topics  . Alcohol use: Yes    Alcohol/week: 0.0 oz    Comment: one miller lite daily   . Drug use: No         OPHTHALMIC EXAM:  Base Eye Exam    Visual Acuity (Snellen - Linear)      Right Left   Dist cc 20/20 -1 20/25 +2   Dist ph cc 20/20 NI   Correction:  Glasses       Tonometry (Tonopen, 8:58 AM)      Right Left   Pressure 14 14       Pupils      Dark Light Shape React APD   Right 4 2 Round Brisk None   Left 4 2 Round Brisk None       Visual Fields      Left Right    Full Full       Extraocular Movement      Right Left    Full, Ortho Full, Ortho       Neuro/Psych    Oriented x3:  Yes   Mood/Affect:  Normal       Dilation    Both eyes:  1.0% Mydriacyl, 2.5% Phenylephrine @ 8:58 AM        Slit Lamp and Fundus Exam     Slit Lamp Exam      Right Left   Lids/Lashes Dermatochalasis - upper lid, Telangiectasia, mild  Meibomian gland dysfunction Dermatochalasis - upper lid, Meibomian gland dysfunction   Conjunctiva/Sclera superior and temporal Pinguecula superior and temporal Pinguecula   Cornea mild arcus mild arcus   Anterior Chamber Deep and quiet Deep , 3+ pigment   Iris No NVI, Round and dilated No NVI, moderately dilated to 4, Transillumination defects from 0130-0200 with IOL haptic visible within   Lens Posterior chamber intraocular lens in good position Posterior chamber intraocular lens in good position   Vitreous vitreous syneresis Vitreous syneresis; no heme       Fundus Exam      Right Left   Disc Pink and Sharp Pink and Sharp   C/D Ratio 0.2 0.2   Macula mild RPE mottling, No heme or edema Flat, mild RPE mottling No heme or edema   Vessels normal, mildly Tortuous mildly Tortuous   Periphery Attached, round focal area of CR atrophy at 0630 mid zone Attached          IMAGING AND PROCEDURES  Imaging and Procedures for @TODAY @  OCT, Retina - OU - Both Eyes       Right Eye Quality was good. Central Foveal Thickness: 281. Progression has been stable. Findings include normal foveal contour, no SRF, no IRF, vitreomacular adhesion .   Left Eye Quality was good. Central Foveal Thickness: 277. Progression has been stable. Findings include normal foveal contour, no IRF, no SRF, vitreomacular adhesion .   Notes *Images captured and stored on drive  Diagnosis / Impression:  NFP, no IRF, no SRF OU   Clinical management:  See below  Abbreviations: NFP - Normal foveal profile. CME - cystoid macular edema. PED - pigment epithelial detachment. IRF - intraretinal fluid. SRF - subretinal fluid. EZ - ellipsoid zone. ERM - epiretinal membrane. ORA - outer retinal atrophy. ORT - outer retinal tubulation. 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 Retinal edema H35.81 OCT, Retina - OU - Both Eyes  5. Pseudophakia of both eyes Z96.1     1-3. UGH Syndrome OS  - 3 piece PCIOL OS centered, but iris has large TID in sup temp quadrant with IOL haptic within area  - cell improved, but still with 3+ pigment OS  - IOP remains controlled today on combigan  - gonio at last visit shows open angles OS, mild PAS  - discussed findings, prognosis and treatment options, possible surgery down the road  - at this point appears to be responding well to medical therapy -- VA remains excellent, no pain, decreasing cell/pigment   - no indication for surgery (IOL exchange) at this point  - okay to stop cyclopentolate  - cont Inveltys QID OS   - cont Combigan BID OS  - f/u 1 month  4. No retinal edema on exam or OCT  5. Pseudophakia OU  - s/p CE/IOL OU -- pt can't remember dates, one eye by [facility], other eye by [facility]  - likely UGH OS as above  - PCIOL OD appears to be in good position  - monitor   Ophthalmic Meds Ordered this visit:  No orders of the defined types were placed in this encounter.      Return in about 1 month (around 07/07/2018) for  F/U UGH OS, DFE, OCT.  There are no Patient Instructions on file for this visit.   Explained the diagnoses, plan, and follow up with the patient and they expressed understanding.  Patient expressed understanding of the importance of proper follow up care.   This document serves as a record of services personally performed by Gardiner Sleeper, MD, PhD. It was created on their behalf by Catha Brow, [place], a certified ophthalmic assistant. The creation of this record is the provider's dictation and/or activities during the visit.  Electronically signed by: Catha Brow, COA  06.10.19 8:14 AM   Gardiner Sleeper, M.D.,  Ph.D. Diseases & Surgery of the Retina and Vitreous [facility]   I have reviewed the above documentation for accuracy and completeness, and I agree with the above. Gardiner Sleeper, M.D., Ph.D. 06/08/18 8:16 AM    Abbreviations: M myopia (nearsighted); A astigmatism; H hyperopia (farsighted); P presbyopia; Mrx spectacle prescription;  CTL contact lenses; OD right eye; OS left eye; OU both eyes  XT exotropia; ET esotropia; PEK punctate epithelial keratitis; PEE punctate epithelial erosions; DES dry eye syndrome; MGD meibomian gland dysfunction; ATs artificial tears; PFAT's preservative free artificial tears; [place] nuclear sclerotic cataract; PSC posterior subcapsular cataract; ERM epi-retinal membrane; PVD posterior vitreous detachment; RD retinal detachment; DM diabetes mellitus; DR diabetic retinopathy; NPDR non-proliferative diabetic retinopathy; PDR proliferative diabetic retinopathy; CSME clinically significant macular edema; DME diabetic macular edema; dbh dot blot hemorrhages; CWS cotton wool spot; POAG primary open angle glaucoma; C/D cup-to-disc ratio; HVF humphrey visual field; GVF goldmann visual field; OCT optical coherence tomography; IOP intraocular pressure; BRVO Branch retinal vein occlusion; CRVO central retinal vein occlusion; CRAO central retinal artery occlusion; BRAO branch retinal artery occlusion; RT retinal tear; SB scleral buckle; PPV pars plana vitrectomy; VH Vitreous hemorrhage; PRP panretinal laser photocoagulation; IVK intravitreal kenalog; VMT vitreomacular traction; MH Macular hole;  NVD neovascularization of the disc; NVE neovascularization elsewhere; AREDS age related eye disease study; ARMD age related macular degeneration; POAG primary open angle glaucoma; EBMD epithelial/anterior basement membrane dystrophy; ACIOL anterior chamber intraocular lens; IOL intraocular lens; PCIOL posterior chamber intraocular lens; Phaco/IOL phacoemulsification with  intraocular lens placement; [place] photorefractive keratectomy; LASIK laser assisted in situ keratomileusis; HTN hypertension; DM diabetes mellitus; COPD chronic obstructive pulmonary disease

## 2018-06-07 ENCOUNTER — Encounter (INDEPENDENT_AMBULATORY_CARE_PROVIDER_SITE_OTHER): Payer: Self-pay | Admitting: Ophthalmology

## 2018-06-07 ENCOUNTER — Ambulatory Visit (INDEPENDENT_AMBULATORY_CARE_PROVIDER_SITE_OTHER): Payer: Medicare Other | Admitting: Ophthalmology

## 2018-06-07 DIAGNOSIS — H3581 Retinal edema: Secondary | ICD-10-CM

## 2018-06-07 DIAGNOSIS — H209 Unspecified iridocyclitis: Secondary | ICD-10-CM | POA: Diagnosis not present

## 2018-06-07 DIAGNOSIS — H401321 Pigmentary glaucoma, left eye, mild stage: Secondary | ICD-10-CM

## 2018-06-07 DIAGNOSIS — I739 Peripheral vascular disease, unspecified: Secondary | ICD-10-CM | POA: Diagnosis not present

## 2018-06-07 DIAGNOSIS — E782 Mixed hyperlipidemia: Secondary | ICD-10-CM | POA: Diagnosis not present

## 2018-06-07 DIAGNOSIS — E119 Type 2 diabetes mellitus without complications: Secondary | ICD-10-CM | POA: Diagnosis not present

## 2018-06-07 DIAGNOSIS — R011 Cardiac murmur, unspecified: Secondary | ICD-10-CM | POA: Diagnosis not present

## 2018-06-07 DIAGNOSIS — E039 Hypothyroidism, unspecified: Secondary | ICD-10-CM | POA: Diagnosis not present

## 2018-06-07 DIAGNOSIS — T8579XA Infection and inflammatory reaction due to other internal prosthetic devices, implants and grafts, initial encounter: Secondary | ICD-10-CM

## 2018-06-07 DIAGNOSIS — Z961 Presence of intraocular lens: Secondary | ICD-10-CM | POA: Diagnosis not present

## 2018-06-07 DIAGNOSIS — H4042X0 Glaucoma secondary to eye inflammation, left eye, stage unspecified: Secondary | ICD-10-CM

## 2018-06-07 DIAGNOSIS — I10 Essential (primary) hypertension: Secondary | ICD-10-CM | POA: Diagnosis not present

## 2018-06-08 ENCOUNTER — Encounter (INDEPENDENT_AMBULATORY_CARE_PROVIDER_SITE_OTHER): Payer: Self-pay | Admitting: Ophthalmology

## 2018-06-10 DIAGNOSIS — Z6828 Body mass index (BMI) 28.0-28.9, adult: Secondary | ICD-10-CM | POA: Diagnosis not present

## 2018-06-10 DIAGNOSIS — E782 Mixed hyperlipidemia: Secondary | ICD-10-CM | POA: Diagnosis not present

## 2018-06-10 DIAGNOSIS — I10 Essential (primary) hypertension: Secondary | ICD-10-CM | POA: Diagnosis not present

## 2018-06-10 DIAGNOSIS — R011 Cardiac murmur, unspecified: Secondary | ICD-10-CM | POA: Diagnosis not present

## 2018-06-10 DIAGNOSIS — E039 Hypothyroidism, unspecified: Secondary | ICD-10-CM | POA: Diagnosis not present

## 2018-06-10 DIAGNOSIS — M10071 Idiopathic gout, right ankle and foot: Secondary | ICD-10-CM | POA: Diagnosis not present

## 2018-06-10 DIAGNOSIS — M10072 Idiopathic gout, left ankle and foot: Secondary | ICD-10-CM | POA: Diagnosis not present

## 2018-06-10 DIAGNOSIS — E119 Type 2 diabetes mellitus without complications: Secondary | ICD-10-CM | POA: Diagnosis not present

## 2018-06-28 ENCOUNTER — Other Ambulatory Visit (INDEPENDENT_AMBULATORY_CARE_PROVIDER_SITE_OTHER): Payer: Self-pay

## 2018-06-28 MED ORDER — LOTEPREDNOL ETABONATE 1 % OP SUSP: 1.0000 [drp] | Freq: Four times a day (QID) | OPHTHALMIC | 0 refills | Status: DC

## 2018-07-04 NOTE — Progress Notes (Signed)
[facility] Note  07/05/2018     CHIEF COMPLAINT Patient presents for Retina Follow Up   HISTORY OF PRESENT ILLNESS: Christopher Randolph is a 72 y.o. male who presents to the clinic today for:   HPI    Retina Follow Up    Patient presents with  Other.  In left eye.  Severity is moderate.  Duration of 1 month.  Since onset it is stable.  I, the attending physician,  performed the HPI with the patient and updated documentation appropriately.          Comments    F/U UGH syndrome OS; Pt states OS is "doing okay"; Pt states OS VA is slightly blurred, states "everything is a little out of focus", states he is also seeing floaters off and on; Pt states OS burns, stings, and is very photophobic; Pt reports using Inveltys OS QID and combigan OS BID as directed; Pt denies flashes, denies wavy VA;        Last edited by [name], MD on 07/05/2018  9:09 AM. (History)      Referring physician: Curlene Labrum, MD [place], [place] [address]  HISTORICAL INFORMATION:   Selected notes from the MEDICAL RECORD NUMBER Referred by Dr. [name] for concern of vitreous hemorrhage LEE: 05.21.19 [name]) [BCVA: OD: OS: ] Ocular Hx-Psedophakia OU,  PMH-DM (on Metformin), Arthritis     CURRENT MEDICATIONS: Current Outpatient Medications (Ophthalmic Drugs)  Medication Sig  . brimonidine-timolol (COMBIGAN) 0.2-0.5 % ophthalmic solution Place 1 drop into the left eye 2 (two) times daily.  . dorzolamide-timolol (COSOPT) 22.3-6.8 MG/ML ophthalmic solution Place 1 drop into the left eye 2 (two) times daily.  . Loteprednol Etabonate (INVELTYS) 1 % SUSP Place 1 drop into the left eye 4 (four) times daily.   No current facility-administered medications for this visit.  (Ophthalmic Drugs)   Current Outpatient Medications (Other)  Medication Sig  . aspirin EC 81 MG tablet Take 81 mg by mouth daily.    Marland Kitchen atorvastatin (LIPITOR) 20 MG tablet Take 1 tablet (20 mg  total) by mouth daily.  Marland Kitchen atorvastatin (LIPITOR) 20 MG tablet Take 20 mg by mouth daily.  . cyclobenzaprine (FLEXERIL) 10 MG tablet Take 10 mg by mouth 3 (three) times daily as needed for muscle spasms.  . fluticasone (FLONASE ALLERGY RELIEF) 50 MCG/ACT nasal spray Place 1-2 sprays into both nostrils daily as needed for allergies.  Marland Kitchen levothyroxine (SYNTHROID, LEVOTHROID) 150 MCG tablet Take 150 mcg by mouth daily before breakfast.  . metFORMIN (GLUCOPHAGE) 500 MG tablet Take 500 mg by mouth daily.   . metoprolol tartrate (LOPRESSOR) 25 MG tablet Take 25 mg by mouth 2 (two) times daily.    . mometasone (ELOCON) 0.1 % ointment Apply 1 application topically 2 (two) times daily as needed (for psorasis).  . nabumetone (RELAFEN) 500 MG tablet Take 500 mg by mouth 2 (two) times daily as needed (for inflammation).  . naproxen sodium (ANAPROX) 220 MG tablet Take 440 mg by mouth daily as needed (for pain.).  Marland Kitchen nitroGLYCERIN (NITROSTAT) 0.4 MG SL tablet Place 1 tablet (0.4 mg total) under the tongue every 5 (five) minutes as needed for chest pain.  Marland Kitchen telmisartan-hydrochlorothiazide (MICARDIS HCT) 80-25 MG per tablet Take 1 tablet by mouth daily.     No current facility-administered medications for this visit.  (Other)      REVIEW OF SYSTEMS: ROS    Positive for: Musculoskeletal, Endocrine, Cardiovascular,  Eyes   Negative for: Constitutional, Gastrointestinal, Neurological, Skin, Genitourinary, HENT, Respiratory, Psychiatric, Allergic/Imm, Heme/Lymph   Last edited by Cherrie Gauze, COA on 07/05/2018  8:57 AM. (History)       ALLERGIES Allergies  Allergen Reactions  . Azithromycin Swelling  . Shrimp [Shellfish Allergy] Other (See Comments)    Gout flares    PAST MEDICAL HISTORY Past Medical History:  Diagnosis Date  . Arthritis   . CAD (coronary artery disease)    Branch vessel and moderate RCA disease 2006  . Cancer ([place]) 1990   Melanoma Lower right Leg  . Carotid artery disease  ([place])   . Diabetes ([place])   . Essential hypertension   . Hyperlipidemia   . Hypothyroidism   . PAD (peripheral artery disease) (HCC)    Left common iliac stent 2004   Past Surgical History:  Procedure Laterality Date  . CATARACT EXTRACTION Bilateral   . CATARACT EXTRACTION, BILATERAL    . CERVICAL DISC SURGERY    . COLONOSCOPY N/A 11/06/2016   Procedure: COLONOSCOPY;  Surgeon: Daneil Dolin, MD;  [facility];  Service: Endoscopy;  Laterality: N/A;  7:30 AM  . COLONOSCOPY  2012   Dr. Anthony Sar: normal. reviewed reports, which states he has a history of polyps in remote past.   . MELANOMA EXCISION     right leg, [facility] and underwent immunotherapy  . NECK SURGERY    . REPLACEMENT TOTAL KNEE      FAMILY HISTORY Family History  Problem Relation Age of Onset  . Heart disease Mother   . Heart attack Mother   . Colon polyps Mother   . Heart attack Maternal Grandmother   . Heart attack Maternal Grandfather   . Colon polyps Brother   . Colon cancer Neg Hx     SOCIAL HISTORY Social History   Tobacco Use  . Smoking status: Former Smoker    Packs/day: 1.50    Years: 40.00    Pack years: 60.00    Types: Cigarettes    Start date: 11/04/1963    Last attempt to quit: 10/29/2003    Years since quitting: 14.6  . Smokeless tobacco: Never Used  Substance Use Topics  . Alcohol use: Yes    Alcohol/week: 0.0 oz    Comment: one miller lite daily   . Drug use: No         OPHTHALMIC EXAM:  Base Eye Exam    Visual Acuity (Snellen - Linear)      Right Left   Dist cc 20/20 20/25   Dist ph cc NI 20/20   Correction:  Glasses       Tonometry (Tonopen, 9:04 AM)      Right Left   Pressure 16 20       Pupils      Dark Light Shape React APD   Right 4 3 Round Brisk None   Left 4 3 Round Brisk None       Visual Fields (Counting fingers)      Left Right    Full Full       Extraocular Movement      Right Left    Full, Ortho Full, Ortho       Neuro/Psych     Oriented x3:  Yes   Mood/Affect:  Normal       Dilation    Both eyes:  1.0% Mydriacyl, 2.5% Phenylephrine @ 9:04 AM        Slit Lamp  and Fundus Exam    Slit Lamp Exam      Right Left   Lids/Lashes Dermatochalasis - upper lid, Telangiectasia, mild Meibomian gland dysfunction Dermatochalasis - upper lid, Meibomian gland dysfunction   Conjunctiva/Sclera superior and temporal Pinguecula superior and temporal Pinguecula   Cornea mild arcus mild arcus   Anterior Chamber Deep and quiet Deep , 0.5+ cell/pigment   Iris No NVI, Round and dilated No NVI, moderately dilated to 4, Transillumination defects from 0130-0200 with IOL haptic visible within   Lens Posterior chamber intraocular lens in good position Posterior chamber intraocular lens in good position   Vitreous vitreous syneresis Vitreous syneresis; no heme       Fundus Exam      Right Left   Disc Pink and Sharp Pink and Sharp   C/D Ratio 0.2 0.2   Macula Flat, mild RPE mottling, No heme or edema Flat, mild RPE mottling No heme or edema   Vessels normal, mildly Tortuous mildly Tortuous   Periphery Attached, round focal area of CR atrophy at 0630 mid zone Attached          IMAGING AND PROCEDURES  Imaging and Procedures for @TODAY @  OCT, Retina - OU - Both Eyes       Right Eye Quality was good. Central Foveal Thickness: 283. Progression has been stable. Findings include normal foveal contour, no SRF, no IRF, vitreomacular adhesion .   Left Eye Quality was good. Central Foveal Thickness: 277. Progression has been stable. Findings include normal foveal contour, no IRF, no SRF, vitreomacular adhesion .   Notes *Images captured and stored on drive  Diagnosis / Impression:  NFP, no IRF, no SRF OU   Clinical management:  See below  Abbreviations: NFP - Normal foveal profile. CME - cystoid macular edema. PED - pigment epithelial detachment. IRF - intraretinal fluid. SRF - subretinal fluid. EZ - ellipsoid zone. ERM -  epiretinal membrane. ORA - outer retinal atrophy. ORT - outer retinal tubulation. 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 Retinal edema H35.81 OCT, Retina - OU - Both Eyes  5. Pseudophakia of both eyes Z96.1     1-3. UGH Syndrome OS  - 3 piece PCIOL OS centered, but iris has large TID in sup temp quadrant with IOL haptic within area  - cell improved, but still with 3+ pigment OS  - IOP remains borderline controlled today on combigan -- 20 OS -- pt reports burning with drops  - gonio at last visit shows open angles OS, mild PAS  - discussed findings, prognosis and treatment options, possible surgery down the road  - at this point appears to be responding well to medical therapy -- VA remains excellent, no pain, decreasing cell/pigment   - no indication for surgery (IOL exchange) at this point  - decrease Inveltys to BID OS   - cont Combigan BID OS -- instructed pt to use before Inveltys, discussed possibly switching if burning worsens  - f/u 1 month  4. No retinal edema on exam or OCT  5. Pseudophakia OU  - s/p CE/IOL OU -- pt can't remember dates, one eye by [facility], [facility] eye by [facility]  -  likely UGH OS as above  - PCIOL OD appears to be in good position  - monitor   Ophthalmic Meds Ordered this visit:  No orders of the defined types were placed in this encounter.      Return in about 1 month (around 08/05/2018) for F/U UGH Syndrome OS, DFE, OCT.  There are no Patient Instructions on file for this visit.   Explained the diagnoses, plan, and follow up with the patient and they expressed understanding.  Patient expressed understanding of the importance of proper follow up care.   This document serves as a record of services  personally performed by Gardiner Sleeper, MD, PhD. It was created on their behalf by Catha Brow, [place], a certified ophthalmic assistant. The creation of this record is the provider's dictation and/or activities during the visit.  Electronically signed by: Catha Brow, [place]  07.08.19 9:40 AM   Gardiner Sleeper, M.D., Ph.D. Diseases & Surgery of the Retina and Vitreous [facility]  I have reviewed the above documentation for accuracy and completeness, and I agree with the above. Gardiner Sleeper, M.D., Ph.D. 07/05/18 9:41 AM    Abbreviations: M myopia (nearsighted); A astigmatism; H hyperopia (farsighted); P presbyopia; Mrx spectacle prescription;  CTL contact lenses; OD right eye; OS left eye; OU both eyes  XT exotropia; ET esotropia; PEK punctate epithelial keratitis; PEE punctate epithelial erosions; DES dry eye syndrome; MGD meibomian gland dysfunction; ATs artificial tears; PFAT's preservative free artificial tears; [place] nuclear sclerotic cataract; PSC posterior subcapsular cataract; ERM epi-retinal membrane; PVD posterior vitreous detachment; RD retinal detachment; DM diabetes mellitus; DR diabetic retinopathy; NPDR non-proliferative diabetic retinopathy; PDR proliferative diabetic retinopathy; CSME clinically significant macular edema; DME diabetic macular edema; dbh dot blot hemorrhages; CWS cotton wool spot; POAG primary open angle glaucoma; C/D cup-to-disc ratio; HVF humphrey visual field; GVF goldmann visual field; OCT optical coherence tomography; IOP intraocular pressure; BRVO Branch retinal vein occlusion; CRVO central retinal vein occlusion; CRAO central retinal artery occlusion; BRAO branch retinal artery occlusion; RT retinal tear; SB scleral buckle; PPV pars plana vitrectomy; VH Vitreous hemorrhage; PRP panretinal laser photocoagulation; IVK intravitreal kenalog; VMT vitreomacular traction; MH Macular hole;  NVD neovascularization of the disc; NVE  neovascularization elsewhere; AREDS age related eye disease study; ARMD age related macular degeneration; POAG primary open angle glaucoma; EBMD epithelial/anterior basement membrane dystrophy; ACIOL anterior chamber intraocular lens; IOL intraocular lens; PCIOL posterior chamber intraocular lens; Phaco/IOL phacoemulsification with intraocular lens placement; [place] photorefractive keratectomy; LASIK laser assisted in situ keratomileusis; HTN hypertension; DM diabetes mellitus; COPD chronic obstructive pulmonary disease

## 2018-07-05 ENCOUNTER — Encounter (INDEPENDENT_AMBULATORY_CARE_PROVIDER_SITE_OTHER): Payer: Self-pay | Admitting: Ophthalmology

## 2018-07-05 ENCOUNTER — Ambulatory Visit (INDEPENDENT_AMBULATORY_CARE_PROVIDER_SITE_OTHER): Payer: Medicare Other | Admitting: Ophthalmology

## 2018-07-05 DIAGNOSIS — H4042X0 Glaucoma secondary to eye inflammation, left eye, stage unspecified: Secondary | ICD-10-CM

## 2018-07-05 DIAGNOSIS — H401321 Pigmentary glaucoma, left eye, mild stage: Secondary | ICD-10-CM | POA: Diagnosis not present

## 2018-07-05 DIAGNOSIS — Z961 Presence of intraocular lens: Secondary | ICD-10-CM | POA: Diagnosis not present

## 2018-07-05 DIAGNOSIS — T8579XA Infection and inflammatory reaction due to other internal prosthetic devices, implants and grafts, initial encounter: Secondary | ICD-10-CM

## 2018-07-05 DIAGNOSIS — H209 Unspecified iridocyclitis: Secondary | ICD-10-CM

## 2018-07-05 DIAGNOSIS — H3581 Retinal edema: Secondary | ICD-10-CM | POA: Diagnosis not present

## 2018-07-07 DIAGNOSIS — M5136 Other intervertebral disc degeneration, lumbar region: Secondary | ICD-10-CM | POA: Diagnosis not present

## 2018-07-07 DIAGNOSIS — M48061 Spinal stenosis, lumbar region without neurogenic claudication: Secondary | ICD-10-CM | POA: Diagnosis not present

## 2018-07-07 DIAGNOSIS — M4726 Other spondylosis with radiculopathy, lumbar region: Secondary | ICD-10-CM | POA: Diagnosis not present

## 2018-07-26 ENCOUNTER — Telehealth: Payer: Self-pay | Admitting: Cardiology

## 2018-07-26 DIAGNOSIS — Z6827 Body mass index (BMI) 27.0-27.9, adult: Secondary | ICD-10-CM | POA: Diagnosis not present

## 2018-07-26 DIAGNOSIS — R739 Hyperglycemia, unspecified: Secondary | ICD-10-CM | POA: Diagnosis not present

## 2018-07-26 DIAGNOSIS — I739 Peripheral vascular disease, unspecified: Secondary | ICD-10-CM | POA: Diagnosis not present

## 2018-07-26 DIAGNOSIS — E782 Mixed hyperlipidemia: Secondary | ICD-10-CM | POA: Diagnosis not present

## 2018-07-26 DIAGNOSIS — I252 Old myocardial infarction: Secondary | ICD-10-CM | POA: Diagnosis not present

## 2018-07-26 DIAGNOSIS — I10 Essential (primary) hypertension: Secondary | ICD-10-CM | POA: Diagnosis not present

## 2018-07-26 DIAGNOSIS — I209 Angina pectoris, unspecified: Secondary | ICD-10-CM | POA: Diagnosis not present

## 2018-07-26 DIAGNOSIS — I6523 Occlusion and stenosis of bilateral carotid arteries: Secondary | ICD-10-CM | POA: Diagnosis not present

## 2018-07-26 DIAGNOSIS — I25119 Atherosclerotic heart disease of native coronary artery with unspecified angina pectoris: Secondary | ICD-10-CM | POA: Diagnosis not present

## 2018-07-26 DIAGNOSIS — E039 Hypothyroidism, unspecified: Secondary | ICD-10-CM | POA: Diagnosis not present

## 2018-07-26 DIAGNOSIS — Z87891 Personal history of nicotine dependence: Secondary | ICD-10-CM | POA: Diagnosis not present

## 2018-07-26 NOTE — Telephone Encounter (Signed)
Patient states not feeling good x few weeks.  Does c/o some chest discomfort that was not alarming to him as he had just recently been helping fiend build a deck.  Stated that he does stay hydrated, drinks plenty of water.  No dizziness.  Does seem to have some SOB which is new for him and fatigue with the least amount of exertion.  Also mentions that his legs feel very weak during these fatigue episodes.  Stated that when he checks his BP during these episodes, BP drops - 88/60, 86/70, 96/78.  Heart rate typically stays in the 60's.  Does also mention that he has lost about 10-15 pounds over the last month.  Has not been trying to lose & has not discussed weight loss with his pmd yet.

## 2018-07-26 NOTE — Telephone Encounter (Signed)
Pt c/o BP issue:  1. What are your last 5 BP readings?   160/80 (7-28)   161/83  (7-29)    156/83 (7-30)  2. Are you having any other symptoms (ex. Dizziness, headache, blurred vision, passed out)?   Fatigue states that his legs feel very weak. 3. What is your medication issue?

## 2018-07-26 NOTE — Telephone Encounter (Signed)
I am concerned about his symptoms as well.  It sounds like he might need some medication adjustments if he is having symptomatic hypotension events and is staying hydrated.  With a 10 to 15 pound weight loss as well, there may be other issues to consider and this could also be contributing to his blood pressure change.  I think I would have him start by seeing his PCP to address the weight loss and they can make an initial adjustments in medical therapy if needed.  Please also make sure that he has a follow-up already scheduled.

## 2018-07-26 NOTE — Telephone Encounter (Signed)
Patient notified and verbalized understanding.  Follow up with Dr. Domenic Polite scheduled for Monday, 08/15/2018 at 3:20 in [place] office.

## 2018-07-29 DIAGNOSIS — I10 Essential (primary) hypertension: Secondary | ICD-10-CM | POA: Diagnosis not present

## 2018-07-29 DIAGNOSIS — R0789 Other chest pain: Secondary | ICD-10-CM | POA: Diagnosis not present

## 2018-07-29 DIAGNOSIS — Z6827 Body mass index (BMI) 27.0-27.9, adult: Secondary | ICD-10-CM | POA: Diagnosis not present

## 2018-07-29 DIAGNOSIS — A6920 Lyme disease, unspecified: Secondary | ICD-10-CM | POA: Diagnosis not present

## 2018-07-29 DIAGNOSIS — R079 Chest pain, unspecified: Secondary | ICD-10-CM | POA: Diagnosis not present

## 2018-08-05 NOTE — Progress Notes (Signed)
[facility] Note  08/08/2018     CHIEF COMPLAINT Patient presents for Retina Follow Up   HISTORY OF PRESENT ILLNESS: Christopher Randolph is a 72 y.o. male who presents to the clinic today for:   HPI    Retina Follow Up    Patient presents with  Other.  In both eyes.  This started 3 months ago.  Severity is mild.  Since onset it is stable.  I, the attending physician,  performed the HPI with the patient and updated documentation appropriately.          Comments    F/U UGH syn. OS. Patient states his left eye is fuzzy and feels out of focus, he continues to have blurry VA and floaters. Pt reports he was Dx with lyme's diseases appx month ago, he also has been having frequent HA's. Bs 120 this am, have been [facility] per patient.       Last edited by [name], MD on 08/08/2018  1:34 PM. (History)    Pt reports recent diagnosis of Lyme disease from target lesions on lower extremities. No blood test confirmation, but pt is currently on doxycycline.   Referring physician: Curlene Labrum, MD [place], [place] [address]  HISTORICAL INFORMATION:   Selected notes from the MEDICAL RECORD NUMBER Referred by Dr. [name] for concern of vitreous hemorrhage LEE: 05.21.19 [name]) [BCVA: OD: OS: ] Ocular Hx-Psedophakia OU,  PMH-DM (on Metformin), Arthritis     CURRENT MEDICATIONS: Current Outpatient Medications (Ophthalmic Drugs)  Medication Sig  . brimonidine-timolol (COMBIGAN) 0.2-0.5 % ophthalmic solution Place 1 drop into the left eye 2 (two) times daily.  . dorzolamide-timolol (COSOPT) 22.3-6.8 MG/ML ophthalmic solution Place 1 drop into the left eye 2 (two) times daily.  . Loteprednol Etabonate (INVELTYS) 1 % SUSP Place 1 drop into the left eye 2 (two) times daily.   No current facility-administered medications for this visit.  (Ophthalmic Drugs)   Current Outpatient Medications (Other)  Medication Sig  . aspirin EC 81 MG tablet Take 81  mg by mouth daily.    Marland Kitchen atorvastatin (LIPITOR) 20 MG tablet Take 20 mg by mouth daily.  . cyclobenzaprine (FLEXERIL) 10 MG tablet Take 10 mg by mouth 3 (three) times daily as needed for muscle spasms.  Marland Kitchen doxycycline (VIBRA-TABS) 100 MG tablet Take 100 mg by mouth 2 (two) times daily.  . fluticasone (FLONASE ALLERGY RELIEF) 50 MCG/ACT nasal spray Place 1-2 sprays into both nostrils daily as needed for allergies.  Marland Kitchen levothyroxine (SYNTHROID, LEVOTHROID) 150 MCG tablet Take 150 mcg by mouth daily before breakfast.  . metFORMIN (GLUCOPHAGE) 500 MG tablet Take 500 mg by mouth daily.   . metoprolol tartrate (LOPRESSOR) 25 MG tablet Take 25 mg by mouth 2 (two) times daily.    . mometasone (ELOCON) 0.1 % ointment Apply 1 application topically 2 (two) times daily as needed (for psorasis).  . nabumetone (RELAFEN) 500 MG tablet Take 500 mg by mouth 2 (two) times daily as needed (for inflammation).  . naproxen sodium (ANAPROX) 220 MG tablet Take 440 mg by mouth daily as needed (for pain.).  Marland Kitchen telmisartan-hydrochlorothiazide (MICARDIS HCT) 80-25 MG per tablet Take 1 tablet by mouth daily.    Marland Kitchen atorvastatin (LIPITOR) 20 MG tablet Take 1 tablet (20 mg total) by mouth daily.  . nitroGLYCERIN (NITROSTAT) 0.4 MG SL tablet Place 1 tablet (0.4 mg total) under the tongue every 5 (five) minutes as needed for chest  pain.   No current facility-administered medications for this visit.  (Other)      REVIEW OF SYSTEMS: ROS    Positive for: Eyes   Negative for: Constitutional, Gastrointestinal, Neurological, Skin, Genitourinary, Musculoskeletal, HENT, Endocrine, Cardiovascular, Respiratory, Psychiatric, Allergic/Imm, Heme/Lymph   Last edited by Zenovia Jordan, LPN on 0/73/7106  2:69 AM. (History)       ALLERGIES Allergies  Allergen Reactions  . Azithromycin Swelling  . Shrimp [Shellfish Allergy] Other (See Comments)    Gout flares    PAST MEDICAL HISTORY Past Medical History:  Diagnosis Date  .  Arthritis   . CAD (coronary artery disease)    Branch vessel and moderate RCA disease 2006  . Cancer ([place]) 1990   Melanoma Lower right Leg  . Carotid artery disease ([place])   . Diabetes ([place])   . Essential hypertension   . Hyperlipidemia   . Hypothyroidism   . Lyme disease   . PAD (peripheral artery disease) (HCC)    Left common iliac stent 2004   Past Surgical History:  Procedure Laterality Date  . CATARACT EXTRACTION Bilateral   . CATARACT EXTRACTION, BILATERAL    . CERVICAL DISC SURGERY    . COLONOSCOPY N/A 11/06/2016   Procedure: COLONOSCOPY;  Surgeon: Daneil Dolin, MD;  [facility];  Service: Endoscopy;  Laterality: N/A;  7:30 AM  . COLONOSCOPY  2012   Dr. Anthony Sar: normal. reviewed reports, which states he has a history of polyps in remote past.   . MELANOMA EXCISION     right leg, seen at [facility] and underwent immunotherapy  . NECK SURGERY    . REPLACEMENT TOTAL KNEE      FAMILY HISTORY Family History  Problem Relation Age of Onset  . Heart disease Mother   . Heart attack Mother   . Colon polyps Mother   . Heart attack Maternal Grandmother   . Heart attack Maternal Grandfather   . Colon polyps Brother   . Colon cancer Neg Hx     SOCIAL HISTORY Social History   Tobacco Use  . Smoking status: Former Smoker    Packs/day: 1.50    Years: 40.00    Pack years: 60.00    Types: Cigarettes    Start date: 11/04/1963    Last attempt to quit: 10/29/2003    Years since quitting: 14.7  . Smokeless tobacco: Never Used  Substance Use Topics  . Alcohol use: Yes    Alcohol/week: 0.0 standard drinks    Comment: one miller lite daily   . Drug use: No         OPHTHALMIC EXAM:  Base Eye Exam    Visual Acuity (Snellen - Linear)      Right Left   Dist cc 20/20 20/20   Dist ph cc NI NI   Correction:  Glasses       Tonometry (Tonopen, 9:38 AM)      Right Left   Pressure 14 15       Pupils      Dark Light Shape React APD   Right 4 3 Round Brisk None    Left 4 3 Round Brisk None       Visual Fields (Counting fingers)      Left Right    Full Full       Extraocular Movement      Right Left    Full, Ortho Full, Ortho       Neuro/Psych    Oriented x3:  Yes  Mood/Affect:  Normal       Dilation    Both eyes:  1.0% Mydriacyl, 2.5% Phenylephrine @ 9:38 AM        Slit Lamp and Fundus Exam    Slit Lamp Exam      Right Left   Lids/Lashes Dermatochalasis - upper lid, Telangiectasia, mild Meibomian gland dysfunction Dermatochalasis - upper lid, Meibomian gland dysfunction   Conjunctiva/Sclera superior and temporal Pinguecula superior and temporal Pinguecula   Cornea mild arcus mild arcus   Anterior Chamber Deep and quiet Deep , 1+ cell/pigment   Iris No NVI, Round and dilated No NVI, moderately dilated to 4, Transillumination defects from 0130-0200 with IOL haptic visible within   Lens Posterior chamber intraocular lens in good position Posterior chamber intraocular lens in good position   Vitreous vitreous syneresis Vitreous syneresis; no heme       Fundus Exam      Right Left   Disc Pink and Sharp Pink and Sharp   C/D Ratio 0.2 0.2   Macula Flat, mild RPE mottling, No heme or edema Flat, mild RPE mottling No heme or edema   Vessels normal, mildly Tortuous mildly Tortuous   Periphery Attached, round focal area of CR atrophy at 0630 mid zone Attached          IMAGING AND PROCEDURES  Imaging and Procedures for @TODAY @  OCT, Retina - OU - Both Eyes       Right Eye Quality was good. Central Foveal Thickness: 281. Progression has been stable. Findings include normal foveal contour, no SRF, no IRF, vitreomacular adhesion .   Left Eye Quality was good. Central Foveal Thickness: 274. Progression has been stable. Findings include normal foveal contour, no IRF, no SRF, vitreomacular adhesion .   Notes *Images captured and stored on drive  Diagnosis / Impression:  NFP, no IRF, no SRF OU   Clinical management:  See  below  Abbreviations: NFP - Normal foveal profile. CME - cystoid macular edema. PED - pigment epithelial detachment. IRF - intraretinal fluid. SRF - subretinal fluid. EZ - ellipsoid zone. ERM - epiretinal membrane. ORA - outer retinal atrophy. ORT - outer retinal tubulation. 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 Retinal edema H35.81 OCT, Retina - OU - Both Eyes  5. Pseudophakia of both eyes Z96.1   6. Lyme disease A69.20     1-3. UGH Syndrome OS  - 3 piece PCIOL OS centered, but iris has large TID in sup temp quadrant with IOL haptic within area  - cell improved, but still with 3+ pigment OS  - IOP remains borderline controlled today on combigan -- 20 OS -- pt reports burning with drops  - gonio at last visit shows open angles OS, mild PAS  - discussed findings, prognosis and treatment options, possible surgery down the road  - at this point appears to be responding well to medical therapy -- VA remains excellent, no pain, decreasing cell/pigment   - no indication for surgery (IOL exchange) at this point  - cont Inveltys to BID OS -- pt ran out of this drop 4-5 days ago -- reordered  - cont Combigan BID OS -- instructed pt  to use before Inveltys, discussed possibly switching if burning worsens  - f/u 2 months  4. No retinal edema on exam or OCT  5. Pseudophakia OU  - s/p CE/IOL OU -- pt can't remember dates, one eye by [facility], other eye by [facility]  - likely UGH OS as above  - PCIOL OD appears to be in good position  - monitor  6. Lyme disease - recently diagnosed from target lesions on lower extremities - no blood test confirmation performed - recommend blood test confirmation via Western blot - currently on oral doxycycline  per PCP office - currently no manifestation in the eyes, but will monitor   Ophthalmic Meds Ordered this visit:  Meds ordered this encounter  Medications  . brimonidine-timolol (COMBIGAN) 0.2-0.5 % ophthalmic solution    Sig: Place 1 drop into the left eye 2 (two) times daily.    Dispense:  10 mL    Refill:  4  . Loteprednol Etabonate (INVELTYS) 1 % SUSP    Sig: Place 1 drop into the left eye 2 (two) times daily.    Dispense:  2.8 mL    Refill:  2       Return in about 2 months (around 10/08/2018) for F/U UGH Synd OS, DFE, OCT.  There are no Patient Instructions on file for this visit.   Explained the diagnoses, plan, and follow up with the patient and they expressed understanding.  Patient expressed understanding of the importance of proper follow up care.   This document serves as a record of services personally performed by Gardiner Sleeper, MD, PhD. It was created on their behalf by Catha Brow, [place], a certified ophthalmic assistant. The creation of this record is the provider's dictation and/or activities during the visit.  Electronically signed by: Catha Brow, COA  08.09.19 1:40 PM    Gardiner Sleeper, M.D., Ph.D. Diseases & Surgery of the Retina and Vitreous [facility]  I have reviewed the above documentation for accuracy and completeness, and I agree with the above. Gardiner Sleeper, M.D., Ph.D. 08/08/18 1:40 PM    Abbreviations: M myopia (nearsighted); A astigmatism; H hyperopia (farsighted); P presbyopia; Mrx spectacle prescription;  CTL contact lenses; OD right eye; OS left eye; OU both eyes  XT exotropia; ET esotropia; PEK punctate epithelial keratitis; PEE punctate epithelial erosions; DES dry eye syndrome; MGD meibomian gland dysfunction; ATs artificial tears; PFAT's preservative free artificial tears; [place] nuclear sclerotic cataract; PSC posterior subcapsular cataract; ERM epi-retinal membrane; PVD posterior vitreous detachment; RD  retinal detachment; DM diabetes mellitus; DR diabetic retinopathy; NPDR non-proliferative diabetic retinopathy; PDR proliferative diabetic retinopathy; CSME clinically significant macular edema; DME diabetic macular edema; dbh dot blot hemorrhages; CWS cotton wool spot; POAG primary open angle glaucoma; C/D cup-to-disc ratio; HVF humphrey visual field; GVF goldmann visual field; OCT optical coherence tomography; IOP intraocular pressure; BRVO Branch retinal vein occlusion; CRVO central retinal vein occlusion; CRAO central retinal artery occlusion; BRAO branch retinal artery occlusion; RT retinal tear; SB scleral buckle; PPV pars plana vitrectomy; VH Vitreous hemorrhage; PRP panretinal laser photocoagulation; IVK intravitreal kenalog; VMT vitreomacular traction; MH Macular hole;  NVD neovascularization of the disc; NVE neovascularization elsewhere; AREDS age related eye disease study; ARMD age related macular degeneration; POAG primary open angle glaucoma; EBMD epithelial/anterior basement membrane dystrophy; ACIOL anterior chamber intraocular lens; IOL intraocular lens; PCIOL posterior chamber intraocular lens; Phaco/IOL phacoemulsification with intraocular lens placement; PRK photorefractive keratectomy; LASIK laser assisted in situ keratomileusis; HTN hypertension;  DM diabetes mellitus; COPD chronic obstructive pulmonary disease

## 2018-08-08 ENCOUNTER — Ambulatory Visit (INDEPENDENT_AMBULATORY_CARE_PROVIDER_SITE_OTHER): Payer: Medicare Other | Admitting: Ophthalmology

## 2018-08-08 ENCOUNTER — Encounter (INDEPENDENT_AMBULATORY_CARE_PROVIDER_SITE_OTHER): Payer: Self-pay | Admitting: Ophthalmology

## 2018-08-08 DIAGNOSIS — H4042X0 Glaucoma secondary to eye inflammation, left eye, stage unspecified: Secondary | ICD-10-CM

## 2018-08-08 DIAGNOSIS — H401321 Pigmentary glaucoma, left eye, mild stage: Secondary | ICD-10-CM

## 2018-08-08 DIAGNOSIS — H3581 Retinal edema: Secondary | ICD-10-CM | POA: Diagnosis not present

## 2018-08-08 DIAGNOSIS — H209 Unspecified iridocyclitis: Secondary | ICD-10-CM | POA: Diagnosis not present

## 2018-08-08 DIAGNOSIS — A6920 Lyme disease, unspecified: Secondary | ICD-10-CM

## 2018-08-08 DIAGNOSIS — E119 Type 2 diabetes mellitus without complications: Secondary | ICD-10-CM | POA: Diagnosis not present

## 2018-08-08 DIAGNOSIS — I25119 Atherosclerotic heart disease of native coronary artery with unspecified angina pectoris: Secondary | ICD-10-CM | POA: Diagnosis not present

## 2018-08-08 DIAGNOSIS — E039 Hypothyroidism, unspecified: Secondary | ICD-10-CM | POA: Diagnosis not present

## 2018-08-08 DIAGNOSIS — T8579XA Infection and inflammatory reaction due to other internal prosthetic devices, implants and grafts, initial encounter: Secondary | ICD-10-CM

## 2018-08-08 DIAGNOSIS — Z961 Presence of intraocular lens: Secondary | ICD-10-CM

## 2018-08-08 DIAGNOSIS — Z6828 Body mass index (BMI) 28.0-28.9, adult: Secondary | ICD-10-CM | POA: Diagnosis not present

## 2018-08-08 DIAGNOSIS — I739 Peripheral vascular disease, unspecified: Secondary | ICD-10-CM | POA: Diagnosis not present

## 2018-08-08 DIAGNOSIS — E782 Mixed hyperlipidemia: Secondary | ICD-10-CM | POA: Diagnosis not present

## 2018-08-08 DIAGNOSIS — I10 Essential (primary) hypertension: Secondary | ICD-10-CM | POA: Diagnosis not present

## 2018-08-08 MED ORDER — BRIMONIDINE TARTRATE-TIMOLOL 0.2-0.5 % OP SOLN: 1.0000 [drp] | Freq: Two times a day (BID) | OPHTHALMIC | 4 refills | Status: DC

## 2018-08-08 MED ORDER — LOTEPREDNOL ETABONATE 1 % OP SUSP: 1.0000 [drp] | Freq: Two times a day (BID) | OPHTHALMIC | 2 refills | Status: DC

## 2018-08-15 ENCOUNTER — Encounter: Payer: Self-pay | Admitting: Cardiology

## 2018-08-15 ENCOUNTER — Ambulatory Visit (INDEPENDENT_AMBULATORY_CARE_PROVIDER_SITE_OTHER): Payer: Medicare Other | Admitting: Cardiology

## 2018-08-15 VITALS — BP 112/62 | HR 68 | Ht 68.0 in | Wt 179.0 lb

## 2018-08-15 DIAGNOSIS — I10 Essential (primary) hypertension: Secondary | ICD-10-CM

## 2018-08-15 DIAGNOSIS — E782 Mixed hyperlipidemia: Secondary | ICD-10-CM

## 2018-08-15 DIAGNOSIS — I25119 Atherosclerotic heart disease of native coronary artery with unspecified angina pectoris: Secondary | ICD-10-CM | POA: Diagnosis not present

## 2018-08-15 DIAGNOSIS — I6523 Occlusion and stenosis of bilateral carotid arteries: Secondary | ICD-10-CM

## 2018-08-15 NOTE — Progress Notes (Signed)
Cardiology Office Note  Date: 08/15/2018   ID: Christopher Randolph, DOB 1946/06/15, MRN [id number]  PCP: Curlene Labrum, MD  Primary Cardiologist: Rozann Lesches, MD   Chief Complaint  Patient presents with  . Coronary Artery Disease    History of Present Illness: Christopher Randolph is a 72 y.o. male last seen in December 2018.  He is here for a routine follow-up visit.  Reports no angina symptoms, intermittent fatigue but no progressive shortness of breath.  Telephone notes reviewed from July.  With decrease in his antihypertensive regimen he reports improvement in previous weakness with hypotensive episodes.  He is still tracking his blood pressure trend for follow-up with Dr. Pleas Koch.  Echocardiogram performed at [facility] in December of last year reported LVEF 55 to 60% with mild LVH, mild left atrial enlargement, normal right ventricular contraction, mildly thickened aortic leaflets without stenosis.  He is due for follow-up carotid Dopplers with previous 50 to 69% bilateral ICA stenosis as of 2017.  Current cardiac regimen includes aspirin, Lipitor, Lopressor, and as needed nitroglycerin.  Past Medical History:  Diagnosis Date  . Arthritis   . CAD (coronary artery disease)    Branch vessel and moderate RCA disease 2006  . Cancer ([place]) 1990   Melanoma Lower right Leg  . Carotid artery disease ([place])   . Diabetes ([place])   . Essential hypertension   . Hyperlipidemia   . Hypothyroidism   . Lyme disease   . PAD (peripheral artery disease) (HCC)    Left common iliac stent 2004    Past Surgical History:  Procedure Laterality Date  . CATARACT EXTRACTION Bilateral   . CATARACT EXTRACTION, BILATERAL    . CERVICAL DISC SURGERY    . COLONOSCOPY N/A 11/06/2016   Procedure: COLONOSCOPY;  Surgeon: Daneil Dolin, MD;  [facility];  Service: Endoscopy;  Laterality: N/A;  7:30 AM  . COLONOSCOPY  2012   Dr. Anthony Sar: normal. reviewed reports, which  states he has a history of polyps in remote past.   . MELANOMA EXCISION     right leg, seen at [facility] and underwent immunotherapy  . NECK SURGERY    . REPLACEMENT TOTAL KNEE      Current Outpatient Medications  Medication Sig Dispense Refill  . aspirin EC 81 MG tablet Take 81 mg by mouth daily.      Marland Kitchen atorvastatin (LIPITOR) 20 MG tablet Take 1 tablet (20 mg total) by mouth daily. 90 tablet 3  . atorvastatin (LIPITOR) 20 MG tablet Take 20 mg by mouth daily.  3  . brimonidine-timolol (COMBIGAN) 0.2-0.5 % ophthalmic solution Place 1 drop into the left eye 2 (two) times daily. 10 mL 4  . dorzolamide-timolol (COSOPT) 22.3-6.8 MG/ML ophthalmic solution Place 1 drop into the left eye 2 (two) times daily. 10 mL 1  . doxycycline (VIBRA-TABS) 100 MG tablet Take 100 mg by mouth 2 (two) times daily.    . fluticasone (FLONASE ALLERGY RELIEF) 50 MCG/ACT nasal spray Place 1-2 sprays into both nostrils daily as needed for allergies.    Marland Kitchen levothyroxine (SYNTHROID, LEVOTHROID) 175 MCG tablet Take 175 mcg by mouth daily before breakfast.    . Loteprednol Etabonate (INVELTYS) 1 % SUSP Place 1 drop into the left eye 2 (two) times daily. 2.8 mL 2  . metFORMIN (GLUCOPHAGE) 500 MG tablet Take 500 mg by mouth daily.     Marland Kitchen METHOCARBAMOL PO Take by mouth.    . metoprolol tartrate (LOPRESSOR)  25 MG tablet Take 25 mg by mouth 2 (two) times daily.      . mometasone (ELOCON) 0.1 % ointment Apply 1 application topically 2 (two) times daily as needed (for psorasis).    . nabumetone (RELAFEN) 500 MG tablet Take 500 mg by mouth 2 (two) times daily as needed (for inflammation).    . naproxen sodium (ANAPROX) 220 MG tablet Take 440 mg by mouth daily as needed (for pain.).    Marland Kitchen telmisartan-hydrochlorothiazide (MICARDIS HCT) 80-25 MG per tablet Take 1 tablet by mouth daily.      . nitroGLYCERIN (NITROSTAT) 0.4 MG SL tablet Place 1 tablet (0.4 mg total) under the tongue every 5 (five) minutes as needed for chest pain. 25 tablet 3    No current facility-administered medications for this visit.    Allergies:  Azithromycin and Shrimp [shellfish allergy]   Social History: The patient  reports that he quit smoking about 14 years ago. His smoking use included cigarettes. He started smoking about 54 years ago. He has a 60.00 pack-year smoking history. He has never used smokeless tobacco. He reports that he drinks alcohol. He reports that he does not use drugs.   ROS:  Please see the history of present illness. Otherwise, complete review of systems is positive for neuropathy.  All other systems are reviewed and negative.   Physical Exam: VS:  BP 112/62   Pulse 68   Ht 5\' 8"  (1.727 m)   Wt 179 lb (81.2 kg)   SpO2 98%   BMI 27.22 kg/m , BMI Body mass index is 27.22 kg/m.  Wt Readings from Last 3 Encounters:  08/15/18 179 lb (81.2 kg)  12/10/17 182 lb (82.6 kg)  12/10/16 186 lb (84.4 kg)    General: Patient appears comfortable at rest. HEENT: Conjunctiva and lids normal, oropharynx clear. Neck: Supple, no elevated JVP or carotid bruits, no thyromegaly. Lungs: Clear to auscultation, nonlabored breathing at rest. Cardiac: Regular rate and rhythm, no S3, 2/6 systolic murmur. Abdomen: Soft, nontender, bowel sounds present. Extremities: No pitting edema, distal pulses 2+. Skin: Warm and dry. Musculoskeletal: No kyphosis. Neuropsychiatric: Alert and oriented x3, affect grossly appropriate.  ECG: I personally reviewed the tracing from 12/10/2017 which showed sinus bradycardia.  Recent Labwork:  October 2017: Cholesterol 117, triglycerides 157, HDL 37, LDL 49, TSH 0.2, BUN 14, creatinine 0.9, potassium 3.8, AST 22, ALT 26, hemoglobin 14.3, platelets 128  Other Studies Reviewed Today:  Exercise Cardiolite 08/22/2014: IMPRESSION: 1. No reversible ischemia or infarction. Fixed inferior wall defect likely due to soft tissue attenuation given normal regional wall motion.  2. Normal left ventricular wall  motion.  3. Left ventricular ejection fraction 80%  4. Low-risk stress test findings*.  Assessment and Plan:  1.  CAD, predominantly branch vessel with moderate RCA disease has been managed medically.  He reports no progressive angina symptoms.  Continue aspirin and statin.  2.  Essential hypertension, intermittent hypotension noted as well with blood pressure fluctuations.  Could be potentially in association with autonomic dysfunction.  Antihypertensive regimen has been cut back.  Continue to track blood pressure trend and follow-up with Dr. Pleas Koch.  3.  Carotid artery disease, moderate as of 2017.  Follow-up carotid Dopplers will be obtained.  4.  Mixed hyperlipidemia, continues on Lipitor with follow-up per Dr. Pleas Koch.  Current medicines were reviewed with the patient today.  Disposition: Follow-up in 6 months.  Signed, Satira Sark, MD, [facility] 08/15/2018 3:34 PM    [facility], [place], [place] [address] Phone: [phone]; Fax: [phone]

## 2018-08-15 NOTE — Patient Instructions (Signed)
Your physician wants you to follow-up in: 6 MONTHS WITH DR MCDOWELL You will receive a reminder letter in the mail two months in advance. If you don't receive a letter, please call our office to schedule the follow-up appointment.  Your physician recommends that you continue on your current medications as directed. Please refer to the Current Medication list given to you today.  Your physician has requested that you have a carotid duplex. This test is an ultrasound of the carotid arteries in your neck. It looks at blood flow through these arteries that supply the brain with blood. Allow one hour for this exam. There are no restrictions or special instructions.  Thank you for choosing [facility]!!    

## 2018-08-16 DIAGNOSIS — I2510 Atherosclerotic heart disease of native coronary artery without angina pectoris: Secondary | ICD-10-CM | POA: Diagnosis not present

## 2018-08-16 DIAGNOSIS — Z7982 Long term (current) use of aspirin: Secondary | ICD-10-CM | POA: Diagnosis not present

## 2018-08-16 DIAGNOSIS — S39012A Strain of muscle, fascia and tendon of lower back, initial encounter: Secondary | ICD-10-CM | POA: Diagnosis not present

## 2018-08-16 DIAGNOSIS — S299XXA Unspecified injury of thorax, initial encounter: Secondary | ICD-10-CM | POA: Diagnosis not present

## 2018-08-16 DIAGNOSIS — M25551 Pain in right hip: Secondary | ICD-10-CM | POA: Diagnosis not present

## 2018-08-16 DIAGNOSIS — Z79899 Other long term (current) drug therapy: Secondary | ICD-10-CM | POA: Diagnosis not present

## 2018-08-16 DIAGNOSIS — S3993XA Unspecified injury of pelvis, initial encounter: Secondary | ICD-10-CM | POA: Diagnosis not present

## 2018-08-16 DIAGNOSIS — S7001XA Contusion of right hip, initial encounter: Secondary | ICD-10-CM | POA: Diagnosis not present

## 2018-08-16 DIAGNOSIS — E039 Hypothyroidism, unspecified: Secondary | ICD-10-CM | POA: Diagnosis not present

## 2018-08-16 DIAGNOSIS — Z7984 Long term (current) use of oral hypoglycemic drugs: Secondary | ICD-10-CM | POA: Diagnosis not present

## 2018-08-16 DIAGNOSIS — I10 Essential (primary) hypertension: Secondary | ICD-10-CM | POA: Diagnosis not present

## 2018-08-16 DIAGNOSIS — W06XXXA Fall from bed, initial encounter: Secondary | ICD-10-CM | POA: Diagnosis not present

## 2018-08-18 ENCOUNTER — Other Ambulatory Visit: Payer: Self-pay | Admitting: Cardiology

## 2018-08-18 DIAGNOSIS — I6523 Occlusion and stenosis of bilateral carotid arteries: Secondary | ICD-10-CM

## 2018-08-25 ENCOUNTER — Ambulatory Visit (INDEPENDENT_AMBULATORY_CARE_PROVIDER_SITE_OTHER): Payer: Medicare Other

## 2018-08-25 DIAGNOSIS — I6523 Occlusion and stenosis of bilateral carotid arteries: Secondary | ICD-10-CM

## 2018-08-26 ENCOUNTER — Telehealth: Payer: Self-pay | Admitting: *Deleted

## 2018-08-26 DIAGNOSIS — I739 Peripheral vascular disease, unspecified: Principal | ICD-10-CM

## 2018-08-26 DIAGNOSIS — I779 Disorder of arteries and arterioles, unspecified: Secondary | ICD-10-CM

## 2018-08-26 NOTE — Telephone Encounter (Signed)
-----   Message from Satira Sark, MD sent at 08/26/2018  8:47 AM EDT ----- Results reviewed.  Bilateral internal carotid artery disease in the range of 60 to 79%.  Continue medical therapy and plan follow-up carotid Dopplers in 12 months. A copy of this test should be forwarded to [place], Virgina Evener, MD.

## 2018-08-26 NOTE — Telephone Encounter (Signed)
Patient informed and copy sent to PCP. 

## 2018-09-06 DIAGNOSIS — Z6828 Body mass index (BMI) 28.0-28.9, adult: Secondary | ICD-10-CM | POA: Diagnosis not present

## 2018-09-06 DIAGNOSIS — M10072 Idiopathic gout, left ankle and foot: Secondary | ICD-10-CM | POA: Diagnosis not present

## 2018-09-07 DIAGNOSIS — E039 Hypothyroidism, unspecified: Secondary | ICD-10-CM | POA: Diagnosis not present

## 2018-09-07 DIAGNOSIS — E782 Mixed hyperlipidemia: Secondary | ICD-10-CM | POA: Diagnosis not present

## 2018-09-07 DIAGNOSIS — E119 Type 2 diabetes mellitus without complications: Secondary | ICD-10-CM | POA: Diagnosis not present

## 2018-09-07 DIAGNOSIS — I10 Essential (primary) hypertension: Secondary | ICD-10-CM | POA: Diagnosis not present

## 2018-09-14 DIAGNOSIS — I25119 Atherosclerotic heart disease of native coronary artery with unspecified angina pectoris: Secondary | ICD-10-CM | POA: Diagnosis not present

## 2018-09-14 DIAGNOSIS — E039 Hypothyroidism, unspecified: Secondary | ICD-10-CM | POA: Diagnosis not present

## 2018-09-14 DIAGNOSIS — I10 Essential (primary) hypertension: Secondary | ICD-10-CM | POA: Diagnosis not present

## 2018-09-14 DIAGNOSIS — R011 Cardiac murmur, unspecified: Secondary | ICD-10-CM | POA: Diagnosis not present

## 2018-09-14 DIAGNOSIS — E782 Mixed hyperlipidemia: Secondary | ICD-10-CM | POA: Diagnosis not present

## 2018-09-14 DIAGNOSIS — Z6828 Body mass index (BMI) 28.0-28.9, adult: Secondary | ICD-10-CM | POA: Diagnosis not present

## 2018-09-14 DIAGNOSIS — Z23 Encounter for immunization: Secondary | ICD-10-CM | POA: Diagnosis not present

## 2018-09-14 DIAGNOSIS — Z1331 Encounter for screening for depression: Secondary | ICD-10-CM | POA: Diagnosis not present

## 2018-09-14 DIAGNOSIS — Z1389 Encounter for screening for other disorder: Secondary | ICD-10-CM | POA: Diagnosis not present

## 2018-10-05 NOTE — Progress Notes (Addendum)
[facility] Note  10/10/2018     CHIEF COMPLAINT Patient presents for Retina Follow Up   HISTORY OF PRESENT ILLNESS: Christopher Randolph is a 72 y.o. male who presents to the clinic today for:   HPI    Retina Follow Up    Patient presents with  Other.  In left eye.  This started 2 months ago.  Severity is mild.  Since onset it is stable.  I, the attending physician,  performed the HPI with the patient and updated documentation appropriately.          Comments    F/U UGH syn OS. Patient states his vision in the os has been "fuzzy, blurry and his eye has been watery and itchy". Bs WINL this am. Pt reports he out of eye gtt's.       Last edited by [name], MD on 10/10/2018  9:56 AM. (History)       Referring physician: Curlene Labrum, MD [place], [place] [address]  HISTORICAL INFORMATION:   Selected notes from the MEDICAL RECORD NUMBER Referred by Dr. [name] for concern of vitreous hemorrhage LEE: 05.21.19 [name]) [BCVA: OD: OS: ] Ocular Hx-Psedophakia OU,  PMH-DM (on Metformin), Arthritis     CURRENT MEDICATIONS: Current Outpatient Medications (Ophthalmic Drugs)  Medication Sig  . brimonidine-timolol (COMBIGAN) 0.2-0.5 % ophthalmic solution Place 1 drop into the left eye 2 (two) times daily.  . dorzolamide-timolol (COSOPT) 22.3-6.8 MG/ML ophthalmic solution Place 1 drop into the left eye 2 (two) times daily.  . Loteprednol Etabonate (INVELTYS) 1 % SUSP Place 1 drop into the left eye 2 (two) times daily.   No current facility-administered medications for this visit.  (Ophthalmic Drugs)   Current Outpatient Medications (Other)  Medication Sig  . aspirin EC 81 MG tablet Take 81 mg by mouth daily.    Marland Kitchen atorvastatin (LIPITOR) 20 MG tablet Take 1 tablet (20 mg total) by mouth daily.  Marland Kitchen atorvastatin (LIPITOR) 20 MG tablet Take 20 mg by mouth daily.  Marland Kitchen doxycycline (VIBRA-TABS) 100 MG tablet Take 100 mg by mouth 2 (two)  times daily.  . fluticasone (FLONASE ALLERGY RELIEF) 50 MCG/ACT nasal spray Place 1-2 sprays into both nostrils daily as needed for allergies.  Marland Kitchen levothyroxine (SYNTHROID, LEVOTHROID) 175 MCG tablet Take 175 mcg by mouth daily before breakfast.  . metFORMIN (GLUCOPHAGE) 500 MG tablet Take 500 mg by mouth daily.   Marland Kitchen METHOCARBAMOL PO Take by mouth.  . metoprolol tartrate (LOPRESSOR) 25 MG tablet Take 25 mg by mouth 2 (two) times daily.    . mometasone (ELOCON) 0.1 % ointment Apply 1 application topically 2 (two) times daily as needed (for psorasis).  . nabumetone (RELAFEN) 500 MG tablet Take 500 mg by mouth 2 (two) times daily as needed (for inflammation).  . naproxen sodium (ANAPROX) 220 MG tablet Take 440 mg by mouth daily as needed (for pain.).  Marland Kitchen nitroGLYCERIN (NITROSTAT) 0.4 MG SL tablet Place 1 tablet (0.4 mg total) under the tongue every 5 (five) minutes as needed for chest pain.  Marland Kitchen telmisartan-hydrochlorothiazide (MICARDIS HCT) 80-25 MG per tablet Take 1 tablet by mouth daily.     No current facility-administered medications for this visit.  (Other)      REVIEW OF SYSTEMS: ROS    Positive for: Endocrine, Eyes   Negative for: Constitutional, Gastrointestinal, Neurological, Skin, Genitourinary, Musculoskeletal, HENT, Cardiovascular, Respiratory, Psychiatric, Allergic/Imm, Heme/Lymph   Last edited by Zenovia Jordan, LPN on  10/10/2018  9:19 AM. (History)       ALLERGIES Allergies  Allergen Reactions  . Azithromycin Swelling  . Shrimp [Shellfish Allergy] Other (See Comments)    Gout flares    PAST MEDICAL HISTORY Past Medical History:  Diagnosis Date  . Arthritis   . CAD (coronary artery disease)    Branch vessel and moderate RCA disease 2006  . Cancer ([place]) 1990   Melanoma Lower right Leg  . Carotid artery disease ([place])   . Diabetes ([place])   . Essential hypertension   . Hyperlipidemia   . Hypothyroidism   . Lyme disease   . PAD (peripheral artery disease) ([facility])     Left common iliac stent 2004   Past Surgical History:  Procedure Laterality Date  . CATARACT EXTRACTION Bilateral   . CATARACT EXTRACTION, BILATERAL    . CERVICAL DISC SURGERY    . COLONOSCOPY N/A 11/06/2016   Procedure: COLONOSCOPY;  Surgeon: Daneil Dolin, MD;  Location: [facility];  Service: Endoscopy;  Laterality: N/A;  7:30 AM  . COLONOSCOPY  2012   Dr. Anthony Sar: normal. reviewed reports, which states he has a history of polyps in remote past.   . MELANOMA EXCISION     right leg, seen at [facility] and underwent immunotherapy  . NECK SURGERY    . REPLACEMENT TOTAL KNEE      FAMILY HISTORY Family History  Problem Relation Age of Onset  . Heart disease Mother   . Heart attack Mother   . Colon polyps Mother   . Heart attack Maternal Grandmother   . Heart attack Maternal Grandfather   . Colon polyps Brother   . Colon cancer Neg Hx     SOCIAL HISTORY Social History   Tobacco Use  . Smoking status: Former Smoker    Packs/day: 1.50    Years: 40.00    Pack years: 60.00    Types: Cigarettes    Start date: 11/04/1963    Last attempt to quit: 10/29/2003    Years since quitting: 14.9  . Smokeless tobacco: Never Used  Substance Use Topics  . Alcohol use: Yes    Alcohol/week: 0.0 standard drinks    Comment: one miller lite daily   . Drug use: No         OPHTHALMIC EXAM:  Base Eye Exam    Visual Acuity (Snellen - Linear)      Right Left   Dist cc 20/20 20/25 +2   Dist ph cc NI    Correction:  Glasses       Tonometry (Tonopen, 9:24 AM)      Right Left   Pressure 16 16       Pupils      Dark Light Shape React APD   Right 4 3 Round Brisk None   Left 4 3 Round Brisk None       Visual Fields (Counting fingers)      Left Right    Full Full       Extraocular Movement      Right Left    Full, Ortho Full, Ortho       Neuro/Psych    Oriented x3:  Yes   Mood/Affect:  Normal       Dilation    Both eyes:  1.0% Mydriacyl, 2.5% Phenylephrine @ 9:22 AM         Slit Lamp and Fundus Exam    External Exam      Right Left   External  brow ptosis brow ptosis       Slit Lamp Exam      Right Left   Lids/Lashes Dermatochalasis - upper lid, Telangiectasia, mild Meibomian gland dysfunction Dermatochalasis - upper lid, Meibomian gland dysfunction   Conjunctiva/Sclera superior and temporal Pinguecula superior and temporal Pinguecula   Cornea mild arcus mild arcus   Anterior Chamber Deep and quiet Deep , 1+ cell/pigment   Iris No NVI, Round and dilated No NVI, moderately dilated to 4, Transillumination defects from 0130-0200 with IOL haptic visible within   Lens Posterior chamber intraocular lens in good position Posterior chamber intraocular lens in good position   Vitreous vitreous syneresis Vitreous syneresis; no heme       Fundus Exam      Right Left   Disc Pink and Sharp Pink and Sharp   C/D Ratio 0.2 0.2   Macula Flat, mild RPE mottling, No heme or edema Flat, mild RPE mottling No heme or edema   Vessels normal, mildly Tortuous mildly Tortuous   Periphery Attached, round focal area of CR atrophy at 0630 mid zone Attached          IMAGING AND PROCEDURES  Imaging and Procedures for @TODAY @  OCT, Retina - OU - Both Eyes       Right Eye Quality was good. Central Foveal Thickness: 282. Progression has been stable. Findings include normal foveal contour, no SRF, no IRF, vitreomacular adhesion .   Left Eye Quality was good. Central Foveal Thickness: 280. Progression has been stable. Findings include normal foveal contour, no IRF, no SRF, vitreomacular adhesion .   Notes *Images captured and stored on drive  Diagnosis / Impression:  NFP, no IRF, no SRF OU   Clinical management:  See below  Abbreviations: NFP - Normal foveal profile. CME - cystoid macular edema. PED - pigment epithelial detachment. IRF - intraretinal fluid. SRF - subretinal fluid. EZ - ellipsoid zone. ERM - epiretinal membrane. ORA - outer retinal atrophy. ORT -  outer retinal tubulation. 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 Retinal edema H35.81 OCT, Retina - OU - Both Eyes  5. Pseudophakia of both eyes Z96.1   6. Lyme disease A69.20   7. Brow ptosis H57.819   8. 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 UGH Syndrome OS  - 3 piece PCIOL OS centered, but iris has large TID in sup temp quadrant with IOL haptic within area  - cell improved, but still with 1+ pigment OS  - IOP good at 16 today, on combigan BID OS  - gonio at last visit shows open angles OS, mild PAS  - discussed findings, prognosis and treatment options, possible surgery down the road  - at this point appears to be responding well to medical therapy -- VA remains excellent, no pain, decreasing cell/pigment   - no indication for surgery (IOL exchange) at this point  - ok to dc Inveltys -- pt ran out of this drop 4-5 days ago -- no rebound inflammation  - cont Combigan BID OS   - f/u 3 months  4. No retinal edema on exam or OCT  5. Pseudophakia  OU  - s/p CE/IOL OU -- pt can't remember dates, one eye by [facility], other eye by [facility]  - likely UGH OS as above  - PCIOL OD appears to be in good position  - monitor  6. Lyme disease - recently diagnosed from target lesions on lower extremities - no blood test confirmation performed - recommend blood test confirmation via Western blot - currently on oral doxycycline per PCP office - no manifestation in the eyes, but will monitor  7,8. Brow ptosis, dermatochalasis OU - refer to [facility] for eyelid eval (Dr. Vickki Muff)   Ophthalmic Meds Ordered this visit:  No orders of the defined types were placed in this encounter.      Return in about 3 months  (around 01/10/2019) for Dilated Exam, OCT.  There are no Patient Instructions on file for this visit.   Explained the diagnoses, plan, and follow up with the patient and they expressed understanding.  Patient expressed understanding of the importance of proper follow up care.   This document serves as a record of services personally performed by Gardiner Sleeper, MD, PhD. It was created on their behalf by Catha Brow, [place], a certified ophthalmic assistant. The creation of this record is the provider's dictation and/or activities during the visit.  Electronically signed by: Catha Brow, COA  10.09.19 1:37 PM   Gardiner Sleeper, M.D., Ph.D. Diseases & Surgery of the Retina and Vitreous [facility]   I have reviewed the above documentation for accuracy and completeness, and I agree with the above. Gardiner Sleeper, M.D., Ph.D. 10/11/18 1:37 PM    Abbreviations: M myopia (nearsighted); A astigmatism; H hyperopia (farsighted); P presbyopia; Mrx spectacle prescription;  CTL contact lenses; OD right eye; OS left eye; OU both eyes  XT exotropia; ET esotropia; PEK punctate epithelial keratitis; PEE punctate epithelial erosions; DES dry eye syndrome; MGD meibomian gland dysfunction; ATs artificial tears; PFAT's preservative free artificial tears; [place] nuclear sclerotic cataract; PSC posterior subcapsular cataract; ERM epi-retinal membrane; PVD posterior vitreous detachment; RD retinal detachment; DM diabetes mellitus; DR diabetic retinopathy; NPDR non-proliferative diabetic retinopathy; PDR proliferative diabetic retinopathy; CSME clinically significant macular edema; DME diabetic macular edema; dbh dot blot hemorrhages; CWS cotton wool spot; POAG primary open angle glaucoma; C/D cup-to-disc ratio; HVF humphrey visual field; GVF goldmann visual field; OCT optical coherence tomography; IOP intraocular pressure; BRVO Branch retinal vein occlusion; CRVO central retinal vein  occlusion; CRAO central retinal artery occlusion; BRAO branch retinal artery occlusion; RT retinal tear; SB scleral buckle; PPV pars plana vitrectomy; VH Vitreous hemorrhage; PRP panretinal laser photocoagulation; IVK intravitreal kenalog; VMT vitreomacular traction; MH Macular hole;  NVD neovascularization of the disc; NVE neovascularization elsewhere; AREDS age related eye disease study; ARMD age related macular degeneration; POAG primary open angle glaucoma; EBMD epithelial/anterior basement membrane dystrophy; ACIOL anterior chamber intraocular lens; IOL intraocular lens; PCIOL posterior chamber intraocular lens; Phaco/IOL phacoemulsification with intraocular lens placement; [place] photorefractive keratectomy; LASIK laser assisted in situ keratomileusis; HTN hypertension; DM diabetes mellitus; COPD chronic obstructive pulmonary disease

## 2018-10-10 ENCOUNTER — Encounter (INDEPENDENT_AMBULATORY_CARE_PROVIDER_SITE_OTHER): Payer: Self-pay | Admitting: Ophthalmology

## 2018-10-10 ENCOUNTER — Ambulatory Visit (INDEPENDENT_AMBULATORY_CARE_PROVIDER_SITE_OTHER): Payer: Medicare Other | Admitting: Ophthalmology

## 2018-10-10 DIAGNOSIS — H401321 Pigmentary glaucoma, left eye, mild stage: Secondary | ICD-10-CM

## 2018-10-10 DIAGNOSIS — H3581 Retinal edema: Secondary | ICD-10-CM | POA: Diagnosis not present

## 2018-10-10 DIAGNOSIS — T8579XA Infection and inflammatory reaction due to other internal prosthetic devices, implants and grafts, initial encounter: Secondary | ICD-10-CM

## 2018-10-10 DIAGNOSIS — H57819 Brow ptosis, unspecified: Secondary | ICD-10-CM | POA: Diagnosis not present

## 2018-10-10 DIAGNOSIS — H02834 Dermatochalasis of left upper eyelid: Secondary | ICD-10-CM

## 2018-10-10 DIAGNOSIS — A6920 Lyme disease, unspecified: Secondary | ICD-10-CM | POA: Diagnosis not present

## 2018-10-10 DIAGNOSIS — Z961 Presence of intraocular lens: Secondary | ICD-10-CM | POA: Diagnosis not present

## 2018-10-10 DIAGNOSIS — H209 Unspecified iridocyclitis: Secondary | ICD-10-CM | POA: Diagnosis not present

## 2018-10-10 DIAGNOSIS — H4042X0 Glaucoma secondary to eye inflammation, left eye, stage unspecified: Secondary | ICD-10-CM

## 2018-10-10 DIAGNOSIS — T85398A Other mechanical complication of other ocular prosthetic devices, implants and grafts, initial encounter: Secondary | ICD-10-CM

## 2018-10-10 DIAGNOSIS — H02831 Dermatochalasis of right upper eyelid: Secondary | ICD-10-CM

## 2018-10-11 ENCOUNTER — Encounter (INDEPENDENT_AMBULATORY_CARE_PROVIDER_SITE_OTHER): Payer: Self-pay | Admitting: Ophthalmology

## 2018-10-19 DIAGNOSIS — M546 Pain in thoracic spine: Secondary | ICD-10-CM | POA: Diagnosis not present

## 2018-10-19 DIAGNOSIS — M47816 Spondylosis without myelopathy or radiculopathy, lumbar region: Secondary | ICD-10-CM | POA: Diagnosis not present

## 2018-10-19 DIAGNOSIS — M48062 Spinal stenosis, lumbar region with neurogenic claudication: Secondary | ICD-10-CM | POA: Diagnosis not present

## 2018-10-19 DIAGNOSIS — M4316 Spondylolisthesis, lumbar region: Secondary | ICD-10-CM | POA: Diagnosis not present

## 2018-10-19 DIAGNOSIS — M5136 Other intervertebral disc degeneration, lumbar region: Secondary | ICD-10-CM | POA: Diagnosis not present

## 2018-10-19 DIAGNOSIS — M5116 Intervertebral disc disorders with radiculopathy, lumbar region: Secondary | ICD-10-CM | POA: Diagnosis not present

## 2018-10-19 DIAGNOSIS — M48061 Spinal stenosis, lumbar region without neurogenic claudication: Secondary | ICD-10-CM | POA: Diagnosis not present

## 2018-10-19 DIAGNOSIS — M4726 Other spondylosis with radiculopathy, lumbar region: Secondary | ICD-10-CM | POA: Diagnosis not present

## 2018-10-20 DIAGNOSIS — M48062 Spinal stenosis, lumbar region with neurogenic claudication: Secondary | ICD-10-CM | POA: Diagnosis not present

## 2018-10-20 DIAGNOSIS — M5136 Other intervertebral disc degeneration, lumbar region: Secondary | ICD-10-CM | POA: Diagnosis not present

## 2018-10-20 DIAGNOSIS — M4726 Other spondylosis with radiculopathy, lumbar region: Secondary | ICD-10-CM | POA: Diagnosis not present

## 2018-10-20 DIAGNOSIS — M47816 Spondylosis without myelopathy or radiculopathy, lumbar region: Secondary | ICD-10-CM | POA: Diagnosis not present

## 2018-10-20 DIAGNOSIS — M4316 Spondylolisthesis, lumbar region: Secondary | ICD-10-CM | POA: Diagnosis not present

## 2018-10-28 ENCOUNTER — Other Ambulatory Visit: Payer: Self-pay | Admitting: *Deleted

## 2018-10-28 DIAGNOSIS — I739 Peripheral vascular disease, unspecified: Principal | ICD-10-CM

## 2018-10-28 DIAGNOSIS — I779 Disorder of arteries and arterioles, unspecified: Secondary | ICD-10-CM

## 2018-11-03 DIAGNOSIS — M5136 Other intervertebral disc degeneration, lumbar region: Secondary | ICD-10-CM | POA: Diagnosis not present

## 2018-11-03 DIAGNOSIS — M48061 Spinal stenosis, lumbar region without neurogenic claudication: Secondary | ICD-10-CM | POA: Diagnosis not present

## 2018-11-03 DIAGNOSIS — M4726 Other spondylosis with radiculopathy, lumbar region: Secondary | ICD-10-CM | POA: Diagnosis not present

## 2018-11-08 DIAGNOSIS — E039 Hypothyroidism, unspecified: Secondary | ICD-10-CM | POA: Diagnosis not present

## 2018-11-08 DIAGNOSIS — I10 Essential (primary) hypertension: Secondary | ICD-10-CM | POA: Diagnosis not present

## 2018-11-08 DIAGNOSIS — E782 Mixed hyperlipidemia: Secondary | ICD-10-CM | POA: Diagnosis not present

## 2018-11-08 DIAGNOSIS — E119 Type 2 diabetes mellitus without complications: Secondary | ICD-10-CM | POA: Diagnosis not present

## 2018-11-09 ENCOUNTER — Ambulatory Visit (INDEPENDENT_AMBULATORY_CARE_PROVIDER_SITE_OTHER): Payer: Medicare Other | Admitting: Ophthalmology

## 2018-11-09 ENCOUNTER — Encounter (INDEPENDENT_AMBULATORY_CARE_PROVIDER_SITE_OTHER): Payer: Self-pay | Admitting: Ophthalmology

## 2018-11-09 DIAGNOSIS — H02834 Dermatochalasis of left upper eyelid: Secondary | ICD-10-CM | POA: Diagnosis not present

## 2018-11-09 DIAGNOSIS — H57819 Brow ptosis, unspecified: Secondary | ICD-10-CM | POA: Diagnosis not present

## 2018-11-09 DIAGNOSIS — Z961 Presence of intraocular lens: Secondary | ICD-10-CM

## 2018-11-09 DIAGNOSIS — H3581 Retinal edema: Secondary | ICD-10-CM | POA: Diagnosis not present

## 2018-11-09 DIAGNOSIS — A6920 Lyme disease, unspecified: Secondary | ICD-10-CM

## 2018-11-09 DIAGNOSIS — H209 Unspecified iridocyclitis: Secondary | ICD-10-CM | POA: Diagnosis not present

## 2018-11-09 DIAGNOSIS — T85398A Other mechanical complication of other ocular prosthetic devices, implants and grafts, initial encounter: Secondary | ICD-10-CM

## 2018-11-09 DIAGNOSIS — T8579XA Infection and inflammatory reaction due to other internal prosthetic devices, implants and grafts, initial encounter: Secondary | ICD-10-CM

## 2018-11-09 DIAGNOSIS — H401321 Pigmentary glaucoma, left eye, mild stage: Secondary | ICD-10-CM | POA: Diagnosis not present

## 2018-11-09 DIAGNOSIS — H02831 Dermatochalasis of right upper eyelid: Secondary | ICD-10-CM

## 2018-11-09 MED ORDER — DORZOLAMIDE HCL-TIMOLOL MAL 2-0.5 % OP SOLN: 1.0000 [drp] | Freq: Two times a day (BID) | OPHTHALMIC | 1 refills | Status: DC

## 2018-11-09 MED ORDER — LOTEPREDNOL ETABONATE 1 % OP SUSP: 1.0000 [drp] | Freq: Four times a day (QID) | OPHTHALMIC | 2 refills | Status: DC

## 2018-11-09 NOTE — Progress Notes (Signed)
[facility] Note  11/09/2018     CHIEF COMPLAINT Patient presents for Blurred Vision and Eye Problem   HISTORY OF PRESENT ILLNESS: Christopher Randolph is a 72 y.o. male who presents to the clinic today for:   HPI    Blurred Vision    In left eye.  Vision is hazy and transient.  Severity is mild.  This started 2 weeks ago.  Occurring intermittently.  It is worse at random times.  Associated symptoms include Negative for eye pain, tearing and redness.  Treatments tried include no treatments.  I, the attending physician,  performed the HPI with the patient and updated documentation appropriately.          Comments    Patient c/o foggy vision OS off and on for the past 2 weeks. Vision blurry OS this am but seems to be getting a little better as day progresses. Some burning and itching OS. OS sensitive to light, sunlight makes OS "uncomfortable." Patient had translaminar epidural 6 days ago. Medication used is a steroid and causes elevation of blood pressure and blood sugar. Blood pressure this am was 182/90. Blood sugar was 214 this am. Last A1c level was below 7 around 3 months ago. Patient off of iveltys eye gtt per Dr. Coralyn Pear. Is using combigan bid OS.        Last edited by Christopher Caffey, MD on 11/09/2018 12:52 PM. (History)     Patient c/o burning and itching OS  Referring physician: Curlene Labrum, MD [place], [place] [id number]  HISTORICAL INFORMATION:   Selected notes from the MEDICAL RECORD NUMBER Referred by Christopher Randolph for concern of vitreous hemorrhage LEE: 05.21.19 Christopher Randolph) [BCVA: OD: OS: ] Ocular Hx-Psedophakia OU,  PMH-DM (on Metformin), Arthritis     CURRENT MEDICATIONS: Current Outpatient Medications (Ophthalmic Drugs)  Medication Sig  . brimonidine-timolol (COMBIGAN) 0.2-0.5 % ophthalmic solution Place 1 drop into the left eye 2 (two) times daily.  . dorzolamide-timolol (COSOPT) 22.3-6.8 MG/ML ophthalmic solution Place  1 drop into the left eye 2 (two) times daily.  . Loteprednol Etabonate (INVELTYS) 1 % SUSP Place 1 drop into the left eye 4 (four) times daily.   No current facility-administered medications for this visit.  (Ophthalmic Drugs)   Current Outpatient Medications (Other)  Medication Sig  . aspirin EC 81 MG tablet Take 81 mg by mouth daily.    Marland Kitchen atorvastatin (LIPITOR) 20 MG tablet Take 20 mg by mouth daily.  Marland Kitchen doxycycline (VIBRA-TABS) 100 MG tablet Take 100 mg by mouth 2 (two) times daily.  . fluticasone (FLONASE ALLERGY RELIEF) 50 MCG/ACT nasal spray Place 1-2 sprays into both nostrils daily as needed for allergies.  Marland Kitchen levothyroxine (SYNTHROID, LEVOTHROID) 150 MCG tablet Take 150 mcg by mouth daily.  Marland Kitchen levothyroxine (SYNTHROID, LEVOTHROID) 175 MCG tablet Take 175 mcg by mouth daily before breakfast.  . metFORMIN (GLUCOPHAGE) 500 MG tablet Take 500 mg by mouth daily.   Marland Kitchen METHOCARBAMOL PO Take by mouth.  . metoprolol tartrate (LOPRESSOR) 25 MG tablet Take 25 mg by mouth 2 (two) times daily.    . mometasone (ELOCON) 0.1 % ointment Apply 1 application topically 2 (two) times daily as needed (for psorasis).  . nabumetone (RELAFEN) 500 MG tablet Take 500 mg by mouth 2 (two) times daily as needed (for inflammation).  . naproxen sodium (ANAPROX) 220 MG tablet Take 440 mg by mouth daily as needed (for pain.).  Marland Kitchen telmisartan-hydrochlorothiazide (MICARDIS HCT)  80-25 MG per tablet Take 1 tablet by mouth daily.    Marland Kitchen atorvastatin (LIPITOR) 20 MG tablet Take 1 tablet (20 mg total) by mouth daily.  . nitroGLYCERIN (NITROSTAT) 0.4 MG SL tablet Place 1 tablet (0.4 mg total) under the tongue every 5 (five) minutes as needed for chest pain.   No current facility-administered medications for this visit.  (Other)      REVIEW OF SYSTEMS: ROS    Positive for: Endocrine, Eyes   Negative for: Constitutional, Gastrointestinal, Neurological, Skin, Genitourinary, Musculoskeletal, HENT, Cardiovascular, Respiratory,  Psychiatric, Allergic/Imm, Heme/Lymph   Last edited by Christopher Randolph on 11/09/2018 10:11 AM. (History)       ALLERGIES Allergies  Allergen Reactions  . Azithromycin Swelling  . Shrimp [Shellfish Allergy] Other (See Comments)    Gout flares    PAST MEDICAL HISTORY Past Medical History:  Diagnosis Date  . Arthritis   . CAD (coronary artery disease)    Branch vessel and moderate RCA disease 2006  . Cancer ([place]) 1990   Melanoma Lower right Leg  . Carotid artery disease ([place])   . Diabetes ([place])   . Essential hypertension   . Hyperlipidemia   . Hypothyroidism   . Lyme disease   . PAD (peripheral artery disease) ([facility])    Left common iliac stent 2004   Past Surgical History:  Procedure Laterality Date  . CATARACT EXTRACTION Bilateral   . CATARACT EXTRACTION, BILATERAL    . CERVICAL DISC SURGERY    . COLONOSCOPY N/A 11/06/2016   Procedure: COLONOSCOPY;  Surgeon: Christopher Dolin, MD;  Location: [facility];  Service: Endoscopy;  Laterality: N/A;  7:30 AM  . COLONOSCOPY  2012   Dr. Anthony Randolph: normal. reviewed reports, which states he has a history of polyps in remote past.   . MELANOMA EXCISION     right leg, seen at [facility] and underwent immunotherapy  . NECK SURGERY    . REPLACEMENT TOTAL KNEE      FAMILY HISTORY Family History  Problem Relation Age of Onset  . Heart disease Mother   . Heart attack Mother   . Colon polyps Mother   . Heart attack Maternal Grandmother   . Heart attack Maternal Grandfather   . Colon polyps Brother   . Colon cancer Neg Hx     SOCIAL HISTORY Social History   Tobacco Use  . Smoking status: Former Smoker    Packs/day: 1.50    Years: 40.00    Pack years: 60.00    Types: Cigarettes    Start date: 11/04/1963    Last attempt to quit: 10/29/2003    Years since quitting: 15.0  . Smokeless tobacco: Never Used  Substance Use Topics  . Alcohol use: Yes    Alcohol/week: 0.0 standard drinks    Comment: one miller lite daily   . Drug use:  No         OPHTHALMIC EXAM:  Base Eye Exam    Visual Acuity (Snellen - Linear)      Right Left   Dist cc 20/20 -1 20/20   Correction:  Glasses       Tonometry (Tonopen, 10:27 AM)      Right Left   Pressure 18 28       Tonometry #2 (Tonopen, 10:28 AM)      Right Left   Pressure 20 26       Pupils      Dark Light Shape React APD   Right 4 3  Round Brisk None   Left 4 3 Round Brisk None       Visual Fields (Counting fingers)      Left Right    Full Full       Extraocular Movement      Right Left    Full, Ortho Full, Ortho       Neuro/Psych    Oriented x3:  Yes   Mood/Affect:  Normal       Dilation    Both eyes:  1.0% Mydriacyl, 2.5% Phenylephrine @ 10:44 AM        Slit Lamp and Fundus Exam    External Exam      Right Left   External brow ptosis brow ptosis       Slit Lamp Exam      Right Left   Lids/Lashes Dermatochalasis - upper lid, Telangiectasia, mild Meibomian gland dysfunction Dermatochalasis - upper lid, Meibomian gland dysfunction   Conjunctiva/Sclera superior and temporal Pinguecula nasal and temporal Pinguecula   Cornea mild arcus mild arcus, well healed cataract wounds   Anterior Chamber Deep and quiet, no cell/flare Deep , 3-4+ cell/pigment   Iris No NVI, Round and dilated No NVI, moderately dilated to 4, Transillumination defects from 0130-0200 with IOL haptic visible within   Lens Posterior chamber intraocular lens in good position Posterior chamber intraocular lens in good position   Vitreous vitreous syneresis Vitreous syneresis; no heme       Fundus Exam      Right Left   Disc Pink and Sharp Pink and Sharp   C/Randolph Ratio 0.2 0.2   Macula Flat, mild RPE mottling, No heme or edema Flat, mild RPE mottling No heme or edema   Vessels normal, mildly Tortuous mildly Tortuous   Periphery Attached, round focal area of CR atrophy at 0630 mid zone Attached        Refraction    Wearing Rx      Sphere Cylinder Axis   Right +0.50 Sphere     Left -0.25 +0.25 037   Type:  Trifocal          IMAGING AND PROCEDURES  Imaging and Procedures for @TODAY @  OCT, Retina - OU - Both Eyes       Right Eye Quality was good. Central Foveal Thickness: 282. Progression has been stable. Findings include normal foveal contour, no SRF, no IRF, vitreomacular adhesion .   Left Eye Quality was good. Central Foveal Thickness: 276. Progression has been stable. Findings include normal foveal contour, no IRF, no SRF, vitreomacular adhesion .   Notes *Images captured and stored on drive  Diagnosis / Impression:  NFP, no IRF, no SRF OU +VMA OU    Clinical management:  See below  Abbreviations: NFP - Normal foveal profile. CME - cystoid macular edema. PED - pigment epithelial detachment. IRF - intraretinal fluid. SRF - subretinal fluid. EZ - ellipsoid zone. ERM - epiretinal membrane. ORA - outer retinal atrophy. ORT - outer retinal tubulation. 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 Retinal edema H35.81 OCT, Retina - OU - Both Eyes  5. Pseudophakia of both eyes Z96.1   6. Lyme disease A69.20   7. Brow ptosis H57.819   8.  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 UGH Syndrome OS  - pt presents acutely for intermittent episodes of decreased vision OS x2 wks  - 3 piece PCIOL OS centered, but iris has large TID in sup temp quadrant with IOL haptic within area  - Randolph/c'Randolph Inveltys last visit on 10/10/18  [facility] with 3+ cell/pigment -- rebound inflammation   - IOP elevated today #1 26 mmHg, #2 28 mm Hg, on combigan BID OS  - gonio at previous visit shows open angles OS, mild PAS  - discussed findings, prognosis and treatment options, possible surgery down the road  - restart inveltys qid OS  - cont  Combigan BID OS until bottle gone, then start cosopt bid OS ($90 cost for Combigan)  - f/u 3 weeks  4. No retinal edema on exam or OCT  5. Pseudophakia OU  - s/p CE/IOL OU -- pt can't remember dates, one eye by [facility], other eye by [facility]  - likely UGH OS as above  - PCIOL OD appears to be in good position  - monitor  6. Lyme disease - recently diagnosed from target lesions on lower extremities - no blood test confirmation performed - completed doxycycline per PCP office - no manifestation in the eyes, but will continue to monitor  7,8. Brow ptosis, dermatochalasis OU - refer to [facility] [place] for eyelid eval (Dr. Vickki Muff)   Ophthalmic Meds Ordered this visit:  Meds ordered this encounter  Medications  . Loteprednol Etabonate (INVELTYS) 1 % SUSP    Sig: Place 1 drop into the left eye 4 (four) times daily.    Dispense:  2.8 mL    Refill:  2  . dorzolamide-timolol (COSOPT) 22.3-6.8 MG/ML ophthalmic solution    Sig: Place 1 drop into the left eye 2 (two) times daily.    Dispense:  10 mL    Refill:  1       Return in about 3 weeks (around 11/30/2018) for 3 weeks for DFE, OCT, IOP check.  There are no Patient Instructions on file for this visit.   Explained the diagnoses, plan, and follow up with the patient and they expressed understanding.  Patient expressed understanding of the importance of proper follow up care.   This document serves as a record of services personally performed by Gardiner Sleeper, MD, PhD. It was created on their behalf by Ernest Mallick, OA, an ophthalmic assistant. The creation of this record is the provider's dictation and/or activities during the visit.    Electronically signed by: Ernest Mallick, OA  11.13.19 1:06 PM    Gardiner Sleeper, M.Randolph., Ph.Randolph. Diseases [facility] Surgery of the Retina and Vitreous [facility]   I have reviewed the above documentation for accuracy and completeness, and I agree with the above. Gardiner Sleeper,  M.Randolph., Ph.Randolph. 11/09/18 1:06 PM     Abbreviations: M myopia (nearsighted); A astigmatism; H hyperopia (farsighted); P presbyopia; Mrx spectacle prescription;  CTL contact lenses; OD right eye; OS left eye; OU both eyes  XT exotropia; ET esotropia; PEK punctate epithelial keratitis; PEE punctate epithelial erosions; DES dry eye syndrome; MGD meibomian gland dysfunction; ATs artificial tears; PFAT's preservative free artificial tears; Harvey nuclear sclerotic cataract; PSC posterior subcapsular cataract; ERM epi-retinal membrane; PVD posterior vitreous detachment; RD retinal detachment; DM diabetes mellitus; DR diabetic retinopathy; NPDR non-proliferative diabetic retinopathy; PDR proliferative diabetic retinopathy; CSME clinically significant macular edema; DME diabetic macular edema; dbh  dot blot hemorrhages; CWS cotton wool spot; POAG primary open angle glaucoma; C/Randolph cup-to-disc ratio; HVF humphrey visual field; GVF goldmann visual field; OCT optical coherence tomography; IOP intraocular pressure; BRVO Branch retinal vein occlusion; CRVO central retinal vein occlusion; CRAO central retinal artery occlusion; BRAO branch retinal artery occlusion; RT retinal tear; SB scleral buckle; PPV pars plana vitrectomy; VH Vitreous hemorrhage; PRP panretinal laser photocoagulation; IVK intravitreal kenalog; VMT vitreomacular traction; MH Macular hole;  NVD neovascularization of the disc; NVE neovascularization elsewhere; AREDS age related eye disease study; ARMD age related macular degeneration; POAG primary open angle glaucoma; EBMD epithelial/anterior basement membrane dystrophy; ACIOL anterior chamber intraocular lens; IOL intraocular lens; PCIOL posterior chamber intraocular lens; Phaco/IOL phacoemulsification with intraocular lens placement; [place] photorefractive keratectomy; LASIK laser assisted in situ keratomileusis; HTN hypertension; DM diabetes mellitus; COPD chronic obstructive pulmonary disease

## 2018-11-15 DIAGNOSIS — I6523 Occlusion and stenosis of bilateral carotid arteries: Secondary | ICD-10-CM | POA: Diagnosis not present

## 2018-11-15 DIAGNOSIS — I10 Essential (primary) hypertension: Secondary | ICD-10-CM | POA: Diagnosis not present

## 2018-11-15 DIAGNOSIS — E039 Hypothyroidism, unspecified: Secondary | ICD-10-CM | POA: Diagnosis not present

## 2018-11-15 DIAGNOSIS — Z6828 Body mass index (BMI) 28.0-28.9, adult: Secondary | ICD-10-CM | POA: Diagnosis not present

## 2018-11-15 DIAGNOSIS — R011 Cardiac murmur, unspecified: Secondary | ICD-10-CM | POA: Diagnosis not present

## 2018-11-15 DIAGNOSIS — I25119 Atherosclerotic heart disease of native coronary artery with unspecified angina pectoris: Secondary | ICD-10-CM | POA: Diagnosis not present

## 2018-11-15 DIAGNOSIS — E782 Mixed hyperlipidemia: Secondary | ICD-10-CM | POA: Diagnosis not present

## 2018-11-15 DIAGNOSIS — I252 Old myocardial infarction: Secondary | ICD-10-CM | POA: Diagnosis not present

## 2018-11-24 ENCOUNTER — Other Ambulatory Visit: Payer: Self-pay | Admitting: Cardiology

## 2018-11-26 DIAGNOSIS — I2510 Atherosclerotic heart disease of native coronary artery without angina pectoris: Secondary | ICD-10-CM | POA: Diagnosis not present

## 2018-11-26 DIAGNOSIS — J069 Acute upper respiratory infection, unspecified: Secondary | ICD-10-CM | POA: Diagnosis not present

## 2018-11-26 DIAGNOSIS — E039 Hypothyroidism, unspecified: Secondary | ICD-10-CM | POA: Diagnosis not present

## 2018-11-26 DIAGNOSIS — I10 Essential (primary) hypertension: Secondary | ICD-10-CM | POA: Diagnosis not present

## 2018-11-26 DIAGNOSIS — Z79890 Hormone replacement therapy (postmenopausal): Secondary | ICD-10-CM | POA: Diagnosis not present

## 2018-11-26 DIAGNOSIS — E119 Type 2 diabetes mellitus without complications: Secondary | ICD-10-CM | POA: Diagnosis not present

## 2018-11-26 DIAGNOSIS — M109 Gout, unspecified: Secondary | ICD-10-CM | POA: Diagnosis not present

## 2018-11-26 DIAGNOSIS — Z79899 Other long term (current) drug therapy: Secondary | ICD-10-CM | POA: Diagnosis not present

## 2018-11-26 DIAGNOSIS — Z7984 Long term (current) use of oral hypoglycemic drugs: Secondary | ICD-10-CM | POA: Diagnosis not present

## 2018-11-26 DIAGNOSIS — M25572 Pain in left ankle and joints of left foot: Secondary | ICD-10-CM | POA: Diagnosis not present

## 2018-11-28 DIAGNOSIS — J069 Acute upper respiratory infection, unspecified: Secondary | ICD-10-CM | POA: Diagnosis not present

## 2018-11-28 DIAGNOSIS — Z6828 Body mass index (BMI) 28.0-28.9, adult: Secondary | ICD-10-CM | POA: Diagnosis not present

## 2018-11-28 DIAGNOSIS — M109 Gout, unspecified: Secondary | ICD-10-CM | POA: Diagnosis not present

## 2018-11-28 DIAGNOSIS — R05 Cough: Secondary | ICD-10-CM | POA: Diagnosis not present

## 2018-11-29 ENCOUNTER — Ambulatory Visit (INDEPENDENT_AMBULATORY_CARE_PROVIDER_SITE_OTHER): Payer: Medicare Other | Admitting: Ophthalmology

## 2018-11-29 DIAGNOSIS — H401321 Pigmentary glaucoma, left eye, mild stage: Secondary | ICD-10-CM | POA: Diagnosis not present

## 2018-11-29 DIAGNOSIS — H209 Unspecified iridocyclitis: Secondary | ICD-10-CM

## 2018-11-29 DIAGNOSIS — H02834 Dermatochalasis of left upper eyelid: Secondary | ICD-10-CM

## 2018-11-29 DIAGNOSIS — H3581 Retinal edema: Secondary | ICD-10-CM | POA: Diagnosis not present

## 2018-11-29 DIAGNOSIS — Z961 Presence of intraocular lens: Secondary | ICD-10-CM

## 2018-11-29 DIAGNOSIS — H02831 Dermatochalasis of right upper eyelid: Secondary | ICD-10-CM

## 2018-11-29 DIAGNOSIS — H4042X0 Glaucoma secondary to eye inflammation, left eye, stage unspecified: Secondary | ICD-10-CM

## 2018-11-29 DIAGNOSIS — H57819 Brow ptosis, unspecified: Secondary | ICD-10-CM | POA: Diagnosis not present

## 2018-11-29 DIAGNOSIS — A6920 Lyme disease, unspecified: Secondary | ICD-10-CM | POA: Diagnosis not present

## 2018-11-29 DIAGNOSIS — T85398A Other mechanical complication of other ocular prosthetic devices, implants and grafts, initial encounter: Secondary | ICD-10-CM

## 2018-11-29 DIAGNOSIS — T8579XA Infection and inflammatory reaction due to other internal prosthetic devices, implants and grafts, initial encounter: Secondary | ICD-10-CM

## 2018-11-29 MED ORDER — BRIMONIDINE TARTRATE 0.2 % OP SOLN: 1.0000 [drp] | Freq: Two times a day (BID) | OPHTHALMIC | 2 refills | Status: DC

## 2018-11-29 NOTE — Progress Notes (Signed)
[facility] Note  11/29/2018     CHIEF COMPLAINT Patient presents for Retina Follow Up   HISTORY OF PRESENT ILLNESS: Christopher Randolph is a 72 y.o. male who presents to the clinic today for:   HPI    Retina Follow Up    Patient presents with  Other.  In left eye.  Severity is moderate.  Duration of 3 weeks.  Since onset it is gradually worsening.  I, the attending physician,  performed the HPI with the patient and updated documentation appropriately.          Comments    Patient was seen at primary care doctor last night and doctor recommended patient see Dr. Coralyn Pear for potential inflammation in eye. Went to doctor last night for sinus/congestion problems. History of anterior uveitis OS. Patient states OS sensitive to light. Vision a little blurry OS. Patient using combigan bid OS and inveltys tid OS.        Last edited by [name], MD on 11/30/2018 12:34 AM. (History)    pt states he saw PCP yesterday afternoon, he states he was told he has an acute upper respatory infection and the doctor thought the pressure had gone back up in his eyes, pt states his eyes burn, he states he went to the ED on Saturday bc of a gout flair up and said something to them about feeling like he had a sinus infection, so they gave him a shot of antibiotics  Referring physician: Curlene Labrum, MD [place], [place] [address]  HISTORICAL INFORMATION:   Selected notes from the MEDICAL RECORD NUMBER Referred by Dr. [name] for concern of vitreous hemorrhage LEE: 05.21.19 [name]) [BCVA: OD: OS: ] Ocular Hx-Psedophakia OU,  PMH-DM (on Metformin), Arthritis     CURRENT MEDICATIONS: Current Outpatient Medications (Ophthalmic Drugs)  Medication Sig  . brimonidine-timolol (COMBIGAN) 0.2-0.5 % ophthalmic solution Place 1 drop into the left eye 2 (two) times daily.  . Loteprednol Etabonate (INVELTYS) 1 % SUSP Place 1 drop into the left eye 4 (four) times  daily.  . brimonidine (ALPHAGAN) 0.2 % ophthalmic solution Place 1 drop into the left eye 2 (two) times daily.  . dorzolamide-timolol (COSOPT) 22.3-6.8 MG/ML ophthalmic solution Place 1 drop into the left eye 2 (two) times daily. (Patient not taking: Reported on 11/29/2018)   No current facility-administered medications for this visit.  (Ophthalmic Drugs)   Current Outpatient Medications (Other)  Medication Sig  . amoxicillin-clavulanate (AUGMENTIN) 875-125 MG tablet Take 1 tablet by mouth 2 (two) times daily.  Marland Kitchen aspirin EC 81 MG tablet Take 81 mg by mouth daily.    Marland Kitchen atorvastatin (LIPITOR) 20 MG tablet Take 20 mg by mouth daily.  Marland Kitchen atorvastatin (LIPITOR) 20 MG tablet TAKE ONE TABLET BY MOUTH DAILY  . doxycycline (VIBRA-TABS) 100 MG tablet Take 100 mg by mouth 2 (two) times daily.  . fluticasone (FLONASE ALLERGY RELIEF) 50 MCG/ACT nasal spray Place 1-2 sprays into both nostrils daily as needed for allergies.  Marland Kitchen levothyroxine (SYNTHROID, LEVOTHROID) 150 MCG tablet Take 150 mcg by mouth daily.  Marland Kitchen levothyroxine (SYNTHROID, LEVOTHROID) 175 MCG tablet Take 175 mcg by mouth daily before breakfast.  . metFORMIN (GLUCOPHAGE) 500 MG tablet Take 500 mg by mouth daily.   Marland Kitchen METHOCARBAMOL PO Take by mouth.  . metoprolol tartrate (LOPRESSOR) 25 MG tablet Take 25 mg by mouth 2 (two) times daily.    . mometasone (ELOCON) 0.1 % ointment Apply 1  application topically 2 (two) times daily as needed (for psorasis).  . nabumetone (RELAFEN) 500 MG tablet Take 500 mg by mouth 2 (two) times daily as needed (for inflammation).  . naproxen sodium (ANAPROX) 220 MG tablet Take 440 mg by mouth daily as needed (for pain.).  Marland Kitchen telmisartan-hydrochlorothiazide (MICARDIS HCT) 80-25 MG per tablet Take 1 tablet by mouth daily.    . traMADol (ULTRAM) 50 MG tablet Take 50 mg by mouth every 6 (six) hours as needed. for pain  . nitroGLYCERIN (NITROSTAT) 0.4 MG SL tablet Place 1 tablet (0.4 mg total) under the tongue every 5 (five)  minutes as needed for chest pain.   No current facility-administered medications for this visit.  (Other)      REVIEW OF SYSTEMS: ROS    Positive for: Endocrine, Eyes   Negative for: Constitutional, Gastrointestinal, Neurological, Skin, Genitourinary, Musculoskeletal, HENT, Cardiovascular, Respiratory, Psychiatric, Allergic/Imm, Heme/Lymph   Last edited by [name] on 11/29/2018  2:09 PM. (History)       ALLERGIES Allergies  Allergen Reactions  . Azithromycin Swelling  . Shrimp [Shellfish Allergy] Other (See Comments)    Gout flares    PAST MEDICAL HISTORY Past Medical History:  Diagnosis Date  . Arthritis   . CAD (coronary artery disease)    Branch vessel and moderate RCA disease 2006  . Cancer ([place]) 1990   Melanoma Lower right Leg  . Carotid artery disease ([place])   . Diabetes ([place])   . Essential hypertension   . Hyperlipidemia   . Hypothyroidism   . Lyme disease   . PAD (peripheral artery disease) ([facility])    Left common iliac stent 2004   Past Surgical History:  Procedure Laterality Date  . CATARACT EXTRACTION Bilateral   . CATARACT EXTRACTION, BILATERAL    . CERVICAL DISC SURGERY    . COLONOSCOPY N/A 11/06/2016   Procedure: COLONOSCOPY;  Surgeon: Daneil Dolin, MD;  Location: [facility];  Service: Endoscopy;  Laterality: N/A;  7:30 AM  . COLONOSCOPY  2012   Dr. Anthony Sar: normal. reviewed reports, which states he has a history of polyps in remote past.   . MELANOMA EXCISION     right leg, [facility] and underwent immunotherapy  . NECK SURGERY    . REPLACEMENT TOTAL KNEE      FAMILY HISTORY Family History  Problem Relation Age of Onset  . Heart disease Mother   . Heart attack Mother   . Colon polyps Mother   . Heart attack Maternal Grandmother   . Heart attack Maternal Grandfather   . Colon polyps Brother   . Colon cancer Neg Hx     SOCIAL HISTORY Social History   Tobacco Use  . Smoking status: Former Smoker    Packs/day: 1.50     Years: 40.00    Pack years: 60.00    Types: Cigarettes    Start date: 11/04/1963    Last attempt to quit: 10/29/2003    Years since quitting: 15.0  . Smokeless tobacco: Never Used  Substance Use Topics  . Alcohol use: Yes    Alcohol/week: 0.0 standard drinks    Comment: one miller lite daily   . Drug use: No         OPHTHALMIC EXAM:  Base Eye Exam    Visual Acuity (Snellen - Linear)      Right Left   Dist cc 20/25 20/40   Dist ph cc 20/20 -1 20/25   Correction:  Glasses  Tonometry (Tonopen, 2:16 PM)      Right Left   Pressure 17 32       Pupils      Dark Light Shape React APD   Right 4 3 Round Slow None   Left 4 3 Round Slow None       Visual Fields (Counting fingers)      Left Right    Full Full       Extraocular Movement      Right Left    Full, Ortho Full, Ortho       Neuro/Psych    Oriented x3:  Yes   Mood/Affect:  Normal       Dilation    Both eyes:  1.0% Mydriacyl, 2.5% Phenylephrine @ 2:16 PM        Slit Lamp and Fundus Exam    External Exam      Right Left   External brow ptosis brow ptosis; mild periorbital edema       Slit Lamp Exam      Right Left   Lids/Lashes Dermatochalasis - upper lid, Telangiectasia, mild Meibomian gland dysfunction Dermatochalasis - upper lid, Meibomian gland dysfunction; mild periorbital/lid edema   Conjunctiva/Sclera superior and temporal Pinguecula nasal and temporal Pinguecula   Cornea mild arcus mild arcus, well healed cataract wounds   Anterior Chamber Deep and quiet, no cell/flare Deep , 2-3+ cell/pigment--mostly pigment   Iris No NVI, Round and dilated No NVI, moderately dilated to 4, Transillumination defects from 0130-0200 with IOL haptic visible within   Lens Posterior chamber intraocular lens in good position Posterior chamber intraocular lens in good position   Vitreous vitreous syneresis Vitreous syneresis; no heme       Fundus Exam      Right Left   Disc Pink and Sharp Pink and Sharp    C/D Ratio 0.2 0.2   Macula Flat, mild RPE mottling, No heme or edema Flat, mild RPE mottling No heme or edema   Vessels normal, mildly Tortuous mildly Tortuous   Periphery Attached, round focal area of CR atrophy at 0630 mid zone Attached        Refraction    Wearing Rx      Sphere Cylinder Axis   Right +0.50 Sphere    Left -0.25 +0.25 037   Type:  Trifocal          IMAGING AND PROCEDURES  Imaging and Procedures for @TODAY @  OCT, Retina - OU - Both Eyes       Right Eye Quality was good. Central Foveal Thickness: 281. Progression has been stable. Findings include normal foveal contour, no SRF, no IRF, vitreomacular adhesion .   Left Eye Quality was good. Central Foveal Thickness: 281. Progression has been stable. Findings include normal foveal contour, no IRF, no SRF, vitreomacular adhesion .   Notes *Images captured and stored on drive  Diagnosis / Impression:  NFP, no IRF, no SRF OU +VMA OU    Clinical management:  See below  Abbreviations: NFP - Normal foveal profile. CME - cystoid macular edema. PED - pigment epithelial detachment. IRF - intraretinal fluid. SRF - subretinal fluid. EZ - ellipsoid zone. ERM - epiretinal membrane. ORA - outer retinal atrophy. ORT - outer retinal tubulation. 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 Retinal edema H35.81 OCT, Retina - OU - Both Eyes  5. Pseudophakia of both eyes Z96.1   6. Lyme disease A69.20   7. Brow ptosis H57.819   8. 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 UGH Syndrome OS  - pt presents acutely for periorbital and lid edema and concern for increased inflammation and IOP in the setting of acute URI  - 3 piece PCIOL OS centered, but iris has large TID in sup temp  quadrant with IOL haptic within area  - d/c'd Inveltys last visit on 10/10/18  [facility] with 3+ cell/pigment -- rebound inflammation   - IOP elevated today 32 mmHg on combigan BID OS  - gonio at previous visit shows open angles OS, mild PAS  - discussed findings, prognosis and treatment options, possible surgery down the road  - cont inveltys tid OS  - cont cosopt bid OS   - start brimonidine bid OS   - f/u 3 weeks  4. No retinal edema on exam or OCT  5. Pseudophakia OU  - s/p CE/IOL OU -- pt can't remember dates, one eye by [facility], other eye by [place]  - likely UGH OS as above  - PCIOL OD appears to be in good position  - monitor  6. Lyme disease - recently diagnosed from target lesions on lower extremities - no blood test confirmation performed - completed doxycycline per PCP office - no manifestation in the eyes, but will continue to monitor  7,8. Brow ptosis, dermatochalasis OU -refer to [facility] for eyelid eval (Dr. Vickki Muff)    Ophthalmic Meds Ordered this visit:  Meds ordered this encounter  Medications  . brimonidine (ALPHAGAN) 0.2 % ophthalmic solution    Sig: Place 1 drop into the left eye 2 (two) times daily.    Dispense:  10 mL    Refill:  2       Return in about 1 week (around 12/06/2018) for f/u IOP check.  There are no Patient Instructions on file for this visit.   Explained the diagnoses, plan, and follow up with the patient and they expressed understanding.  Patient expressed understanding of the importance of proper follow up care.   This document serves as a record of services personally performed by Gardiner Sleeper, MD, PhD. It was created on their behalf by Ernest Mallick, OA, an ophthalmic assistant. The creation of this record is the provider's dictation and/or activities during the visit.    Electronically signed by: Ernest Mallick, OA  12.03.19 12:35 AM     Gardiner Sleeper, M.D., Ph.D. Diseases & Surgery of the Retina and Vitreous [facility]   I have reviewed the above documentation for accuracy and completeness, and I agree with the above. Gardiner Sleeper, M.D., Ph.D. 11/30/18 12:38 AM     Abbreviations: M myopia (nearsighted); A astigmatism; H hyperopia (farsighted); P presbyopia; Mrx spectacle prescription;  CTL contact lenses; OD right eye; OS left eye; OU both eyes  XT exotropia; ET esotropia; PEK punctate epithelial keratitis; PEE punctate epithelial erosions; DES dry eye syndrome; MGD meibomian gland dysfunction; ATs artificial tears; PFAT's preservative free artificial tears; [place] nuclear sclerotic cataract; PSC posterior subcapsular cataract; ERM epi-retinal membrane; PVD posterior vitreous detachment; RD retinal detachment; DM diabetes mellitus; DR diabetic retinopathy; NPDR non-proliferative diabetic retinopathy; PDR proliferative diabetic retinopathy; CSME clinically significant macular edema; DME  diabetic macular edema; dbh dot blot hemorrhages; CWS cotton wool spot; POAG primary open angle glaucoma; C/D cup-to-disc ratio; HVF humphrey visual field; GVF goldmann visual field; OCT optical coherence tomography; IOP intraocular pressure; BRVO Branch retinal vein occlusion; CRVO central retinal vein occlusion; CRAO central retinal artery occlusion; BRAO branch retinal artery occlusion; RT retinal tear; SB scleral buckle; PPV pars plana vitrectomy; VH Vitreous hemorrhage; PRP panretinal laser photocoagulation; IVK intravitreal kenalog; VMT vitreomacular traction; MH Macular hole;  NVD neovascularization of the disc; NVE neovascularization elsewhere; AREDS age related eye disease study; ARMD age related macular degeneration; POAG primary open angle glaucoma; EBMD epithelial/anterior basement membrane dystrophy; ACIOL anterior chamber intraocular lens; IOL intraocular lens; PCIOL posterior chamber intraocular lens; Phaco/IOL phacoemulsification with intraocular lens placement; [place] photorefractive keratectomy;  LASIK laser assisted in situ keratomileusis; HTN hypertension; DM diabetes mellitus; COPD chronic obstructive pulmonary disease

## 2018-11-30 ENCOUNTER — Encounter (INDEPENDENT_AMBULATORY_CARE_PROVIDER_SITE_OTHER): Payer: Self-pay | Admitting: Ophthalmology

## 2018-12-01 ENCOUNTER — Encounter (INDEPENDENT_AMBULATORY_CARE_PROVIDER_SITE_OTHER): Payer: Medicare Other | Admitting: Ophthalmology

## 2018-12-02 ENCOUNTER — Encounter (INDEPENDENT_AMBULATORY_CARE_PROVIDER_SITE_OTHER): Payer: Medicare Other | Admitting: Ophthalmology

## 2018-12-05 NOTE — Progress Notes (Signed)
[facility] Note  12/07/2018     CHIEF COMPLAINT Patient presents for Retina Follow Up   HISTORY OF PRESENT ILLNESS: Christopher Randolph is a 72 y.o. male who presents to the clinic today for:   HPI    Retina Follow Up    Patient presents with  Other.  In left eye.  This started 2 months ago.  Severity is moderate.  Duration of 1 week.  Since onset it is gradually improving.  I, the attending physician,  performed the HPI with the patient and updated documentation appropriately.          Comments    72 y/o male pt here for 1 wk f/u for UGH Syndrome OS.  No change in [place] OU.  Denies pain, flashes, floaters.  Inveltys TID OS Cosopt BID OS Brimonidine BID OS       Last edited by [name], MD on 12/07/2018  9:39 AM. (History)    pt states he is feeling better  Referring physician: Arminda Resides, [place] [address]  HISTORICAL INFORMATION:   Selected notes from the MEDICAL RECORD NUMBER Referred by Dr. [name] for concern of vitreous hemorrhage LEE: 05.21.19 [name]) [BCVA: OD: OS: ] Ocular Hx-Psedophakia OU,  PMH-DM (on Metformin), Arthritis     CURRENT MEDICATIONS: Current Outpatient Medications (Ophthalmic Drugs)  Medication Sig  . brimonidine (ALPHAGAN) 0.2 % ophthalmic solution Place 1 drop into the left eye 2 (two) times daily.  . brimonidine-timolol (COMBIGAN) 0.2-0.5 % ophthalmic solution Place 1 drop into the left eye 2 (two) times daily.  . dorzolamide-timolol (COSOPT) 22.3-6.8 MG/ML ophthalmic solution Place 1 drop into the left eye 2 (two) times daily.  . Loteprednol Etabonate (INVELTYS) 1 % SUSP Place 1 drop into the left eye 2 (two) times daily.   No current facility-administered medications for this visit.  (Ophthalmic Drugs)   Current Outpatient Medications (Other)  Medication Sig  . amoxicillin-clavulanate (AUGMENTIN) 875-125 MG tablet Take 1 tablet by mouth 2 (two) times daily.   Marland Kitchen aspirin EC 81 MG tablet Take 81 mg by mouth daily.    Marland Kitchen atorvastatin (LIPITOR) 20 MG tablet Take 20 mg by mouth daily.  Marland Kitchen atorvastatin (LIPITOR) 20 MG tablet TAKE ONE TABLET BY MOUTH DAILY  . doxycycline (VIBRA-TABS) 100 MG tablet Take 100 mg by mouth 2 (two) times daily.  . fluticasone (FLONASE ALLERGY RELIEF) 50 MCG/ACT nasal spray Place 1-2 sprays into both nostrils daily as needed for allergies.  Marland Kitchen levothyroxine (SYNTHROID, LEVOTHROID) 150 MCG tablet Take 150 mcg by mouth daily.  Marland Kitchen levothyroxine (SYNTHROID, LEVOTHROID) 175 MCG tablet Take 175 mcg by mouth daily before breakfast.  . metFORMIN (GLUCOPHAGE) 500 MG tablet Take 500 mg by mouth daily.   Marland Kitchen METHOCARBAMOL PO Take by mouth.  . metoprolol tartrate (LOPRESSOR) 25 MG tablet Take 25 mg by mouth 2 (two) times daily.    . mometasone (ELOCON) 0.1 % ointment Apply 1 application topically 2 (two) times daily as needed (for psorasis).  . nabumetone (RELAFEN) 500 MG tablet Take 500 mg by mouth 2 (two) times daily as needed (for inflammation).  . naproxen sodium (ANAPROX) 220 MG tablet Take 440 mg by mouth daily as needed (for pain.).  Marland Kitchen nitroGLYCERIN (NITROSTAT) 0.4 MG SL tablet Place 1 tablet (0.4 mg total) under the tongue every 5 (five) minutes as needed for chest pain.  Marland Kitchen telmisartan-hydrochlorothiazide (MICARDIS HCT) 80-25 MG per tablet Take 1 tablet by mouth  daily.    . traMADol (ULTRAM) 50 MG tablet Take 50 mg by mouth every 6 (six) hours as needed. for pain   No current facility-administered medications for this visit.  (Other)      REVIEW OF SYSTEMS: ROS    Positive for: Endocrine, Eyes   Negative for: Constitutional, Gastrointestinal, Neurological, Skin, Genitourinary, Musculoskeletal, HENT, Cardiovascular, Respiratory, Psychiatric, Allergic/Imm, Heme/Lymph   Last edited by Matthew Folks, COA on 12/07/2018  9:29 AM. (History)       ALLERGIES Allergies  Allergen Reactions  . Azithromycin Swelling  . Shrimp  [Shellfish Allergy] Other (See Comments)    Gout flares    PAST MEDICAL HISTORY Past Medical History:  Diagnosis Date  . Arthritis   . CAD (coronary artery disease)    Branch vessel and moderate RCA disease 2006  . Cancer ([place]) 1990   Melanoma Lower right Leg  . Carotid artery disease ([place])   . Diabetes ([place])   . Essential hypertension   . Hyperlipidemia   . Hypothyroidism   . Lyme disease   . PAD (peripheral artery disease) ([facility])    Left common iliac stent 2004   Past Surgical History:  Procedure Laterality Date  . CATARACT EXTRACTION Bilateral   . CATARACT EXTRACTION, BILATERAL    . CERVICAL DISC SURGERY    . COLONOSCOPY N/A 11/06/2016   Procedure: COLONOSCOPY;  Surgeon: Daneil Dolin, MD;  Location: [facility];  Service: Endoscopy;  Laterality: N/A;  7:30 AM  . COLONOSCOPY  2012   Dr. Anthony Sar: normal. reviewed reports, which states he has a history of polyps in remote past.   . MELANOMA EXCISION     right leg, seen at [facility] and underwent immunotherapy  . NECK SURGERY    . REPLACEMENT TOTAL KNEE      FAMILY HISTORY Family History  Problem Relation Age of Onset  . Heart disease Mother   . Heart attack Mother   . Colon polyps Mother   . Heart attack Maternal Grandmother   . Heart attack Maternal Grandfather   . Colon polyps Brother   . Colon cancer Neg Hx     SOCIAL HISTORY Social History   Tobacco Use  . Smoking status: Former Smoker    Packs/day: 1.50    Years: 40.00    Pack years: 60.00    Types: Cigarettes    Start date: 11/04/1963    Last attempt to quit: 10/29/2003    Years since quitting: 15.1  . Smokeless tobacco: Never Used  Substance Use Topics  . Alcohol use: Yes    Alcohol/week: 0.0 standard drinks    Comment: one miller lite daily   . Drug use: No         OPHTHALMIC EXAM:  Base Eye Exam    Visual Acuity (Snellen - Linear)      Right Left   Dist cc 20/20 -2 20/20 -2   Correction:  Glasses       Tonometry (Tonopen, 9:31 AM)       Right Left   Pressure 13 17       Pupils      Dark Light Shape React APD   Right 4 3 Round Slow None   Left 4 3 Round Slow None       Visual Fields (Counting fingers)      Left Right    Full Full       Extraocular Movement      Right Left  Full, Ortho Full, Ortho       Neuro/Psych    Oriented x3:  Yes   Mood/Affect:  Normal       Dilation    Both eyes:  1.0% Mydriacyl, 2.5% Phenylephrine @ 9:31 AM        Slit Lamp and Fundus Exam    External Exam      Right Left   External brow ptosis brow ptosis; mild periorbital edema       Slit Lamp Exam      Right Left   Lids/Lashes Dermatochalasis - upper lid, Telangiectasia, mild Meibomian gland dysfunction Dermatochalasis - upper lid, Meibomian gland dysfunction; mild periorbital/lid edema   Conjunctiva/Sclera superior and temporal Pinguecula nasal and temporal Pinguecula   Cornea mild arcus mild arcus, well healed cataract wounds   Anterior Chamber Deep and quiet, no cell/flare Deep, rare pigment   Iris No NVI, Round and dilated No NVI, moderately dilated to 4, Transillumination defects from 0130-0200 with IOL haptic visible within   Lens Posterior chamber intraocular lens in good position Posterior chamber intraocular lens in good position   Vitreous vitreous syneresis Vitreous syneresis; no heme       Fundus Exam      Right Left   Disc Pink and Sharp Pink and Sharp   C/D Ratio 0.2 0.2   Macula Flat, mild RPE mottling, No heme or edema Flat, mild RPE mottling No heme or edema   Vessels normal, mildly Tortuous mildly Tortuous   Periphery Attached, round focal area of CR atrophy at 0630 mid zone Attached          IMAGING AND PROCEDURES  Imaging and Procedures for @TODAY @           ASSESSMENT/PLAN:    ICD-10-CM   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 Retinal edema H35.81   5.  Pseudophakia of both eyes Z96.1   6. Lyme disease A69.20   7. Brow ptosis H57.819   8. 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 UGH Syndrome OS  - 3 piece PCIOL OS centered, but iris has large TID in sup temp quadrant with IOL haptic within area  - d/c'd Inveltys on 10/10/18 but had flare up on 11/29/18 and was restarted  - AC with pigment today  - IOP improved today at 17 mmHg on cosopt and brimonidine BID OS  - gonio at previous visit shows open angles OS, mild PAS  - discussed findings, prognosis and treatment options, possible surgery down the road  - cont inveltys bid OS  - cont cosopt bid OS   - cont brimonidine bid OS   - f/u 4 weeks  4. No retinal edema on exam or OCT  5. Pseudophakia OU  - s/p CE/IOL OU -- pt can't remember dates, one eye by [facility], other eye by [place]  - likely UGH OS as above  - PCIOL OD appears to be in good position  - monitor  6. Lyme disease - recently diagnosed from target lesions on lower extremities - no blood test confirmation performed - completed doxycycline per PCP office - no manifestation in the eyes, but will continue to monitor  7,8. Brow ptosis, dermatochalasis OU - refer to [facility] for eyelid eval (Dr. Vickki Muff)   Ophthalmic Meds Ordered this visit:  Meds ordered this encounter  Medications  . Loteprednol Etabonate (INVELTYS) 1 % SUSP    Sig: Place 1 drop into the left eye 2 (two) times daily.    Dispense:  2.8 mL    Refill:  2  . dorzolamide-timolol (COSOPT) 22.3-6.8 MG/ML ophthalmic solution    Sig: Place 1 drop into the left eye 2 (two) times daily.    Dispense:  10 mL    Refill:  3  . brimonidine (ALPHAGAN) 0.2 % ophthalmic solution    Sig: Place 1 drop into the left eye 2 (two) times daily.    Dispense:  10 mL    Refill:  3       Return in about 4 weeks (around 01/04/2019) for F/U 4 weeks UGH syndrome, DFE, OCT.  There are no Patient Instructions on file for this  visit.   Explained the diagnoses, plan, and follow up with the patient and they expressed understanding.  Patient expressed understanding of the importance of proper follow up care.   This document serves as a record of services personally performed by Gardiner Sleeper, MD, PhD. It was created on their behalf by Ernest Mallick, OA, an ophthalmic assistant. The creation of this record is the provider's dictation and/or activities during the visit.    Electronically signed by: Ernest Mallick, OA  12.09.19 11:48 PM    Gardiner Sleeper, M.D., Ph.D. Diseases & Surgery of the Retina and Vitreous [facility]  I have reviewed the above documentation for accuracy and completeness, and I agree with the above. Gardiner Sleeper, M.D., Ph.D. 12/10/18 11:48 PM   Abbreviations: M myopia (nearsighted); A astigmatism; H hyperopia (farsighted); P presbyopia; Mrx spectacle prescription;  CTL contact lenses; OD right eye; OS left eye; OU both eyes  XT exotropia; ET esotropia; PEK punctate epithelial keratitis; PEE punctate epithelial erosions; DES dry eye syndrome; MGD meibomian gland dysfunction; ATs artificial tears; PFAT's preservative free artificial tears; [place] nuclear sclerotic cataract; PSC posterior subcapsular cataract; ERM epi-retinal membrane; PVD posterior vitreous detachment; RD retinal detachment; DM diabetes mellitus; DR diabetic retinopathy; NPDR non-proliferative diabetic retinopathy; PDR proliferative diabetic retinopathy; CSME clinically significant macular edema; DME diabetic macular edema; dbh dot blot hemorrhages; CWS cotton wool spot; POAG primary open angle glaucoma; C/D cup-to-disc ratio; HVF humphrey visual field; GVF goldmann visual field; OCT optical coherence tomography; IOP intraocular pressure; BRVO Branch retinal vein occlusion; CRVO central retinal vein occlusion; CRAO central retinal artery occlusion; BRAO branch retinal artery occlusion; RT retinal tear; SB scleral  buckle; PPV pars plana vitrectomy; VH Vitreous hemorrhage; PRP panretinal laser photocoagulation; IVK intravitreal kenalog; VMT vitreomacular traction; MH Macular hole;  NVD neovascularization of the disc; NVE neovascularization elsewhere; AREDS age related eye disease study; ARMD age related macular degeneration; POAG primary open angle glaucoma; EBMD epithelial/anterior basement membrane dystrophy; ACIOL anterior chamber intraocular lens; IOL intraocular lens; PCIOL posterior chamber intraocular lens; Phaco/IOL phacoemulsification with intraocular lens placement; [place] photorefractive keratectomy; LASIK laser assisted in situ keratomileusis; HTN hypertension; DM diabetes mellitus; COPD chronic obstructive pulmonary disease

## 2018-12-07 ENCOUNTER — Encounter (INDEPENDENT_AMBULATORY_CARE_PROVIDER_SITE_OTHER): Payer: Self-pay | Admitting: Ophthalmology

## 2018-12-07 ENCOUNTER — Ambulatory Visit (INDEPENDENT_AMBULATORY_CARE_PROVIDER_SITE_OTHER): Payer: Medicare Other | Admitting: Ophthalmology

## 2018-12-07 DIAGNOSIS — H02834 Dermatochalasis of left upper eyelid: Secondary | ICD-10-CM | POA: Diagnosis not present

## 2018-12-07 DIAGNOSIS — A6920 Lyme disease, unspecified: Secondary | ICD-10-CM

## 2018-12-07 DIAGNOSIS — H209 Unspecified iridocyclitis: Secondary | ICD-10-CM | POA: Diagnosis not present

## 2018-12-07 DIAGNOSIS — H3581 Retinal edema: Secondary | ICD-10-CM

## 2018-12-07 DIAGNOSIS — H401321 Pigmentary glaucoma, left eye, mild stage: Secondary | ICD-10-CM | POA: Diagnosis not present

## 2018-12-07 DIAGNOSIS — Z961 Presence of intraocular lens: Secondary | ICD-10-CM | POA: Diagnosis not present

## 2018-12-07 DIAGNOSIS — H02831 Dermatochalasis of right upper eyelid: Secondary | ICD-10-CM | POA: Diagnosis not present

## 2018-12-07 DIAGNOSIS — T8579XA Infection and inflammatory reaction due to other internal prosthetic devices, implants and grafts, initial encounter: Secondary | ICD-10-CM

## 2018-12-07 DIAGNOSIS — H57819 Brow ptosis, unspecified: Secondary | ICD-10-CM

## 2018-12-07 MED ORDER — DORZOLAMIDE HCL-TIMOLOL MAL 2-0.5 % OP SOLN: 1.0000 [drp] | Freq: Two times a day (BID) | OPHTHALMIC | 3 refills | Status: AC

## 2018-12-07 MED ORDER — LOTEPREDNOL ETABONATE 1 % OP SUSP: 1.0000 [drp] | Freq: Two times a day (BID) | OPHTHALMIC | 2 refills | Status: DC

## 2018-12-07 MED ORDER — BRIMONIDINE TARTRATE 0.2 % OP SOLN: 1.0000 [drp] | Freq: Two times a day (BID) | OPHTHALMIC | 3 refills | Status: AC

## 2018-12-10 ENCOUNTER — Encounter (INDEPENDENT_AMBULATORY_CARE_PROVIDER_SITE_OTHER): Payer: Self-pay | Admitting: Ophthalmology

## 2018-12-28 DIAGNOSIS — T8859XA Other complications of anesthesia, initial encounter: Secondary | ICD-10-CM

## 2018-12-28 HISTORY — DX: Other complications of anesthesia, initial encounter: T88.59XA

## 2018-12-29 ENCOUNTER — Other Ambulatory Visit: Payer: Self-pay | Admitting: Neurosurgery

## 2018-12-29 DIAGNOSIS — M4726 Other spondylosis with radiculopathy, lumbar region: Secondary | ICD-10-CM | POA: Diagnosis not present

## 2018-12-29 DIAGNOSIS — M48062 Spinal stenosis, lumbar region with neurogenic claudication: Secondary | ICD-10-CM | POA: Diagnosis not present

## 2018-12-29 DIAGNOSIS — M47816 Spondylosis without myelopathy or radiculopathy, lumbar region: Secondary | ICD-10-CM | POA: Diagnosis not present

## 2018-12-29 DIAGNOSIS — M5136 Other intervertebral disc degeneration, lumbar region: Secondary | ICD-10-CM | POA: Diagnosis not present

## 2018-12-29 DIAGNOSIS — M4316 Spondylolisthesis, lumbar region: Secondary | ICD-10-CM | POA: Diagnosis not present

## 2019-01-02 DIAGNOSIS — Z87891 Personal history of nicotine dependence: Secondary | ICD-10-CM | POA: Diagnosis not present

## 2019-01-02 DIAGNOSIS — I25119 Atherosclerotic heart disease of native coronary artery with unspecified angina pectoris: Secondary | ICD-10-CM | POA: Diagnosis not present

## 2019-01-02 DIAGNOSIS — Z6828 Body mass index (BMI) 28.0-28.9, adult: Secondary | ICD-10-CM | POA: Diagnosis not present

## 2019-01-02 DIAGNOSIS — M48061 Spinal stenosis, lumbar region without neurogenic claudication: Secondary | ICD-10-CM | POA: Diagnosis not present

## 2019-01-02 DIAGNOSIS — J984 Other disorders of lung: Secondary | ICD-10-CM | POA: Diagnosis not present

## 2019-01-02 DIAGNOSIS — J439 Emphysema, unspecified: Secondary | ICD-10-CM | POA: Diagnosis not present

## 2019-01-02 DIAGNOSIS — I10 Essential (primary) hypertension: Secondary | ICD-10-CM | POA: Diagnosis not present

## 2019-01-02 DIAGNOSIS — I252 Old myocardial infarction: Secondary | ICD-10-CM | POA: Diagnosis not present

## 2019-01-04 DIAGNOSIS — J439 Emphysema, unspecified: Secondary | ICD-10-CM | POA: Diagnosis not present

## 2019-01-04 DIAGNOSIS — I700 Atherosclerosis of aorta: Secondary | ICD-10-CM | POA: Diagnosis not present

## 2019-01-04 DIAGNOSIS — Z87891 Personal history of nicotine dependence: Secondary | ICD-10-CM | POA: Diagnosis not present

## 2019-01-04 NOTE — Progress Notes (Signed)
[facility] Note  01/05/2019     CHIEF COMPLAINT Patient presents for Retina Follow Up   HISTORY OF PRESENT ILLNESS: Christopher Randolph is a 73 y.o. 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  Severity is mild.  Duration of 4 weeks.  Since onset it is gradually worsening.  I, the attending physician,  performed the HPI with the patient and updated documentation appropriately.          Comments    Patient states vision more fuzzy OS. Patient some irritation OS, some watering and light sensitivity OS. Symptoms slightly worse OS. Patient on inveltys, cosopt, and brimonidine bid OS.        Last edited by [name], MD on 01/05/2019 10:59 AM. (History)    Patient states vision fuzzy OS  Referring physician: Curlene Labrum, MD [place], [place] [id number]  HISTORICAL INFORMATION:   Selected notes from the MEDICAL RECORD NUMBER Referred by Dr. [name] for concern of vitreous hemorrhage LEE: 05.21.19 [name]) [BCVA: OD: OS: ] Ocular Hx-Psedophakia OU,  PMH-DM (on Metformin), Arthritis     CURRENT MEDICATIONS: Current Outpatient Medications (Ophthalmic Drugs)  Medication Sig  . brimonidine (ALPHAGAN) 0.2 % ophthalmic solution Place 1 drop into the left eye 2 (two) times daily.  . brimonidine-timolol (COMBIGAN) 0.2-0.5 % ophthalmic solution Place 1 drop into the left eye 2 (two) times daily.  . Loteprednol Etabonate (INVELTYS) 1 % SUSP Place 1 drop into the left eye 2 (two) times daily.  . dorzolamide-timolol (COSOPT) 22.3-6.8 MG/ML ophthalmic solution Place 1 drop into the left eye 2 (two) times daily. (Patient not taking: Reported on 01/05/2019)   No current facility-administered medications for this visit.  (Ophthalmic Drugs)   Current Outpatient Medications (Other)  Medication Sig  . aspirin EC 81 MG tablet Take 81 mg by mouth daily.    Marland Kitchen atorvastatin  (LIPITOR) 20 MG tablet TAKE ONE TABLET BY MOUTH DAILY (Patient taking differently: Take 20 mg by mouth daily. )  . fluticasone (FLONASE ALLERGY RELIEF) 50 MCG/ACT nasal spray Place 2 sprays into both nostrils daily.   Marland Kitchen levothyroxine (SYNTHROID, LEVOTHROID) 150 MCG tablet Take 150 mcg by mouth every other day.   . levothyroxine (SYNTHROID, LEVOTHROID) 175 MCG tablet Take 175 mcg by mouth every other day.   . metFORMIN (GLUCOPHAGE) 500 MG tablet Take 500 mg by mouth daily.   . methocarbamol (ROBAXIN) 500 MG tablet Take 500 mg by mouth at bedtime as needed (muscle spasms).   . metoprolol tartrate (LOPRESSOR) 25 MG tablet Take 25 mg by mouth 2 (two) times daily.    . mometasone (ELOCON) 0.1 % ointment Apply 1 application topically 2 (two) times daily as needed (for psorasis).  . nabumetone (RELAFEN) 500 MG tablet Take 500 mg by mouth 2 (two) times daily as needed (for inflammation).  . naproxen sodium (ANAPROX) 220 MG tablet Take 440 mg by mouth daily as needed (for pain.).  Marland Kitchen telmisartan-hydrochlorothiazide (MICARDIS HCT) 80-25 MG per tablet Take 1 tablet by mouth daily.    . traMADol (ULTRAM) 50 MG tablet Take 50 mg by mouth every 6 (six) hours as needed. for pain  . nitroGLYCERIN (NITROSTAT) 0.4 MG SL tablet Place 1 tablet (0.4 mg total) under the tongue every 5 (five) minutes as needed for chest pain.   No current facility-administered  medications for this visit.  (Other)      REVIEW OF SYSTEMS: ROS    Positive for: Endocrine, Eyes   Negative for: Constitutional, Gastrointestinal, Neurological, Skin, Genitourinary, Musculoskeletal, HENT, Cardiovascular, Respiratory, Psychiatric, Allergic/Imm, Heme/Lymph   Last edited by [name] on 01/05/2019 10:06 AM. (History)       ALLERGIES Allergies  Allergen Reactions  . Azithromycin Swelling  . Shrimp [Shellfish Allergy] Other (See Comments)    Gout flares    PAST MEDICAL HISTORY Past Medical History:  Diagnosis Date  . Arthritis    . CAD (coronary artery disease)    Branch vessel and moderate RCA disease 2006  . Cancer ([place]) 1990   Melanoma Lower right Leg  . Carotid artery disease ([place])   . Diabetes ([place])   . Essential hypertension   . Hyperlipidemia   . Hypothyroidism   . Lyme disease   . PAD (peripheral artery disease) ([facility])    Left common iliac stent 2004   Past Surgical History:  Procedure Laterality Date  . CATARACT EXTRACTION Bilateral   . CATARACT EXTRACTION, BILATERAL    . CERVICAL DISC SURGERY    . COLONOSCOPY N/A 11/06/2016   Procedure: COLONOSCOPY;  Surgeon: Daneil Dolin, MD;  Location: [facility];  Service: Endoscopy;  Laterality: N/A;  7:30 AM  . COLONOSCOPY  2012   Dr. Anthony Sar: normal. reviewed reports, which states he has a history of polyps in remote past.   . MELANOMA EXCISION     right leg, seen at [facility] and underwent immunotherapy  . NECK SURGERY    . REPLACEMENT TOTAL KNEE      FAMILY HISTORY Family History  Problem Relation Age of Onset  . Heart disease Mother   . Heart attack Mother   . Colon polyps Mother   . Heart attack Maternal Grandmother   . Heart attack Maternal Grandfather   . Colon polyps Brother   . Colon cancer Neg Hx     SOCIAL HISTORY Social History   Tobacco Use  . Smoking status: Former Smoker    Packs/day: 1.50    Years: 40.00    Pack years: 60.00    Types: Cigarettes    Start date: 11/04/1963    Last attempt to quit: 10/29/2003    Years since quitting: 15.1  . Smokeless tobacco: Never Used  Substance Use Topics  . Alcohol use: Yes    Alcohol/week: 0.0 standard drinks    Comment: one miller lite daily   . Drug use: No         OPHTHALMIC EXAM:  Base Eye Exam    Visual Acuity (Snellen - Linear)      Right Left   Dist cc 20/20 20/20 -2   Dist ph cc  20/20 -1   Correction:  Glasses       Tonometry (Tonopen, 10:17 AM)      Right Left   Pressure 19 18       Pupils      Dark Light Shape React APD   Right 4 3 Round Slow None    Left 4 3 Round Slow None       Visual Fields (Counting fingers)      Left Right    Full Full       Extraocular Movement      Right Left    Full, Ortho Full, Ortho       Neuro/Psych    Oriented x3:  Yes   Mood/Affect:  Normal  Dilation    Both eyes:  1.0% Mydriacyl, 2.5% Phenylephrine @ 10:17 AM        Slit Lamp and Fundus Exam    External Exam      Right Left   External brow ptosis brow ptosis; mild periorbital edema       Slit Lamp Exam      Right Left   Lids/Lashes Dermatochalasis - upper lid, Telangiectasia, mild Meibomian gland dysfunction Dermatochalasis - upper lid, Meibomian gland dysfunction; mild periorbital/lid edema   Conjunctiva/Sclera superior and temporal Pinguecula nasal and temporal Pinguecula   Cornea mild arcus mild arcus, well healed cataract wounds   Anterior Chamber Deep and quiet, no cell/flare Deep, 1/2+ cell/pigment   Iris No NVI, Round and dilated No NVI, moderately dilated to 4, Transillumination defects from 0130-0200 with IOL haptic visible within   Lens Posterior chamber intraocular lens in good position Posterior chamber intraocular lens in good position   Vitreous vitreous syneresis Vitreous syneresis; no heme       Fundus Exam      Right Left   Disc Pink and Sharp Pink and Sharp   C/D Ratio 0.2 0.2   Macula Flat, mild RPE mottling, No heme or edema Flat, mild RPE mottling No heme or edema   Vessels normal, mildly Tortuous mildly Tortuous   Periphery Attached, round focal area of CR atrophy at 0630 mid zone Attached        Refraction    Wearing Rx      Sphere Cylinder Axis   Right +0.50 Sphere    Left -0.25 +0.25 037   Type:  Trifocal          IMAGING AND PROCEDURES  Imaging and Procedures for @TODAY @  OCT, Retina - OU - Both Eyes       Right Eye Quality was good. Central Foveal Thickness: 288. Progression has been stable. Findings include normal foveal contour, no SRF, no IRF, vitreomacular adhesion .   Left  Eye Quality was good. Central Foveal Thickness: 281. Progression has been stable. Findings include normal foveal contour, no IRF, no SRF, vitreomacular adhesion .   Notes *Images captured and stored on drive  Diagnosis / Impression:  NFP, no IRF, no SRF OU +VMA OU    Clinical management:  See below  Abbreviations: NFP - Normal foveal profile. CME - cystoid macular edema. PED - pigment epithelial detachment. IRF - intraretinal fluid. SRF - subretinal fluid. EZ - ellipsoid zone. ERM - epiretinal membrane. ORA - outer retinal atrophy. ORT - outer retinal tubulation. 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 Retinal edema H35.81 OCT, Retina - OU - Both Eyes  5. Pseudophakia of both eyes Z96.1   6. Lyme disease A69.20   7. Brow ptosis H57.819   8. 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 UGH Syndrome OS  - 3 piece PCIOL OS centered, but iris has large TID in sup temp quadrant with IOL haptic within area  - d/c'd Inveltys on 10/10/18 but had flare up on 11/29/18 and was restarted  - AC with cell/pigment today, slight worsening of inflammation OS  - IOP OK today at 18 mmHg on cosopt  and brimonidine BID OS  - gonio at previous visit shows open angles OS, mild PAS  - discussed findings, prognosis and treatment options, possible surgery down the road  - increase inveltys to tid OS  - cont cosopt bid OS   - cont brimonidine bid OS   - f/u 4 weeks  4. No retinal edema on exam or OCT  5. Pseudophakia OU  - s/p CE/IOL OU -- pt can't remember dates, one eye by [facility], other eye by [facility]  - likely UGH OS as above  - PCIOL OD appears to be in good position  - monitor  6. Lyme disease - recently diagnosed from target lesions on lower  extremities - no blood test confirmation performed - completed doxycycline per PCP office - no manifestation in the eyes, but will continue to monitor  7,8. Brow ptosis, dermatochalasis OU - refer to [facility] for eyelid eval (Dr. Vickki Muff)   Ophthalmic Meds Ordered this visit:  No orders of the defined types were placed in this encounter.      Return 4-6 weeks, for DFE, OCT.  There are no Patient Instructions on file for this visit.   Explained the diagnoses, plan, and follow up with the patient and they expressed understanding.  Patient expressed understanding of the importance of proper follow up care.   This document serves as a record of services personally performed by Gardiner Sleeper, MD, PhD. It was created on their behalf by Ernest Mallick, OA, an ophthalmic assistant. The creation of this record is the provider's dictation and/or activities during the visit.    Electronically signed by: Ernest Mallick, OA  01.08.2020 11:33 PM     Gardiner Sleeper, M.D., Ph.D. Diseases & Surgery of the Retina and Vitreous [facility]  I have reviewed the above documentation for accuracy and completeness, and I agree with the above. Gardiner Sleeper, M.D., Ph.D. 01/05/19 11:35 PM    Abbreviations: M myopia (nearsighted); A astigmatism; H hyperopia (farsighted); P presbyopia; Mrx spectacle prescription;  CTL contact lenses; OD right eye; OS left eye; OU both eyes  XT exotropia; ET esotropia; PEK punctate epithelial keratitis; PEE punctate epithelial erosions; DES dry eye syndrome; MGD meibomian gland dysfunction; ATs artificial tears; PFAT's preservative free artificial tears; [place] nuclear sclerotic cataract; PSC posterior subcapsular cataract; ERM epi-retinal membrane; PVD posterior vitreous detachment; RD retinal detachment; DM diabetes mellitus; DR diabetic retinopathy; NPDR non-proliferative diabetic retinopathy; PDR proliferative diabetic retinopathy; CSME clinically  significant macular edema; DME diabetic macular edema; dbh dot blot hemorrhages; CWS cotton wool spot; POAG primary open angle glaucoma; C/D cup-to-disc ratio; HVF humphrey visual field; GVF goldmann visual field; OCT optical coherence tomography; IOP intraocular pressure; BRVO Branch retinal vein occlusion; CRVO central retinal vein occlusion; CRAO central retinal artery occlusion; BRAO branch retinal artery occlusion; RT retinal tear; SB scleral buckle; PPV pars plana vitrectomy; VH Vitreous hemorrhage; PRP panretinal laser photocoagulation; IVK intravitreal kenalog; VMT vitreomacular traction; MH Macular hole;  NVD neovascularization of the disc; NVE neovascularization elsewhere; AREDS age related eye disease study; ARMD age related macular degeneration; POAG primary open angle glaucoma; EBMD epithelial/anterior basement membrane dystrophy; ACIOL anterior chamber intraocular lens; IOL intraocular lens; PCIOL posterior chamber intraocular lens; Phaco/IOL phacoemulsification with intraocular lens placement; [place] photorefractive keratectomy; LASIK laser assisted in situ keratomileusis; HTN hypertension; DM diabetes mellitus; COPD chronic obstructive pulmonary disease

## 2019-01-05 ENCOUNTER — Ambulatory Visit (INDEPENDENT_AMBULATORY_CARE_PROVIDER_SITE_OTHER): Payer: Medicare Other | Admitting: Ophthalmology

## 2019-01-05 ENCOUNTER — Encounter (INDEPENDENT_AMBULATORY_CARE_PROVIDER_SITE_OTHER): Payer: Self-pay | Admitting: Ophthalmology

## 2019-01-05 DIAGNOSIS — H02834 Dermatochalasis of left upper eyelid: Secondary | ICD-10-CM

## 2019-01-05 DIAGNOSIS — Z961 Presence of intraocular lens: Secondary | ICD-10-CM

## 2019-01-05 DIAGNOSIS — H3581 Retinal edema: Secondary | ICD-10-CM

## 2019-01-05 DIAGNOSIS — H209 Unspecified iridocyclitis: Secondary | ICD-10-CM

## 2019-01-05 DIAGNOSIS — T8579XA Infection and inflammatory reaction due to other internal prosthetic devices, implants and grafts, initial encounter: Secondary | ICD-10-CM

## 2019-01-05 DIAGNOSIS — H4042X0 Glaucoma secondary to eye inflammation, left eye, stage unspecified: Secondary | ICD-10-CM

## 2019-01-05 DIAGNOSIS — H57819 Brow ptosis, unspecified: Secondary | ICD-10-CM | POA: Diagnosis not present

## 2019-01-05 DIAGNOSIS — H401321 Pigmentary glaucoma, left eye, mild stage: Secondary | ICD-10-CM | POA: Diagnosis not present

## 2019-01-05 DIAGNOSIS — A6920 Lyme disease, unspecified: Secondary | ICD-10-CM

## 2019-01-05 DIAGNOSIS — H02831 Dermatochalasis of right upper eyelid: Secondary | ICD-10-CM

## 2019-01-09 ENCOUNTER — Encounter (INDEPENDENT_AMBULATORY_CARE_PROVIDER_SITE_OTHER): Payer: Medicare Other | Admitting: Ophthalmology

## 2019-01-10 NOTE — Pre-Procedure Instructions (Signed)
Christopher Randolph  01/10/2019      [facility] - [place], [place], [place] [address] [place] [address] Phone: [phone] Fax: [phone]    Your procedure is scheduled on January 22  Report to [facility] at [place].M.  Call this number if you have problems the morning of surgery:  [phone]   Remember:  Do not eat or drink after midnight.    Take these medicines the morning of surgery with A SIP OF WATER  Eye drops if needed fluticasone (FLONASE ALLERGY RELIEF)  levothyroxine (SYNTHROID, LEVOTHROID) metoprolol tartrate (LOPRESSOR) traMADol (ULTRAM)  Follow your surgeon's instructions on when to stop Asprin.  If no instructions were given by your surgeon then you will need to call the office to get those instructions.    7 days prior to surgery STOP taking any Aspirin (unless otherwise instructed by your surgeon), Aleve, Naproxen, Ibuprofen, Motrin, Advil, Goody's, BC's, all herbal medications, fish oil, and all vitamins, nabumetone (RELAFEN)   WHAT DO I DO ABOUT MY DIABETES MEDICATION?   Marland Kitchen Do not take oral diabetes medicines (pills) the morning of surgery. metFORMIN (GLUCOPHAGE)   How to Manage Your Diabetes Before and After Surgery  Why is it important to control my blood sugar before and after surgery? . Improving blood sugar levels before and after surgery helps healing and can limit problems. . A way of improving blood sugar control is eating a healthy diet by: o  Eating less sugar and carbohydrates o  Increasing activity/exercise o  Talking with your doctor about reaching your blood sugar goals . High blood sugars (greater than 180 mg/dL) can raise your risk of infections and slow your recovery, so you will need to focus on controlling your diabetes during the weeks before surgery. . Make sure that the doctor who takes care of your diabetes knows about your planned surgery including the date and  location.  How do I manage my blood sugar before surgery? . Check your blood sugar at least 4 times a day, starting 2 days before surgery, to make sure that the level is not too high or low. o Check your blood sugar the morning of your surgery when you wake up and every 2 hours until you get to the Short Stay unit. . If your blood sugar is less than 70 mg/dL, you will need to treat for low blood sugar: o Do not take insulin. o Treat a low blood sugar (less than 70 mg/dL) with  cup of clear juice (cranberry or apple), 4 glucose tablets, OR glucose gel. o Recheck blood sugar in 15 minutes after treatment (to make sure it is greater than 70 mg/dL). If your blood sugar is not greater than 70 mg/dL on recheck, call [phone] for further instructions. . Report your blood sugar to the short stay nurse when you get to Short Stay.  . If you are admitted to the hospital after surgery: o Your blood sugar will be checked by the staff and you will probably be given insulin after surgery (instead of oral diabetes medicines) to make sure you have good blood sugar levels. o The goal for blood sugar control after surgery is 80-180 mg/dL.     Do not wear jewelry, make-up or nail polish.  Do not wear lotions, powders, or perfumes, or deodorant.  Do not shave 48 hours prior to surgery.  Men may shave face and neck.  Do not bring valuables to the hospital.  [facility] is not responsible for any belongings or valuables.  Contacts, dentures or bridgework may not be worn into surgery.  Leave your suitcase in the car.  After surgery it may be brought to your room.  For patients admitted to the hospital, discharge time will be determined by your treatment team.  Patients discharged the day of surgery will not be allowed to drive home.   Special instructions:   [facility]- Preparing For Surgery  Before surgery, you can play an important role. Because skin is not sterile, your skin needs to be as free of  germs as possible. You can reduce the number of germs on your skin by washing with CHG (chlorahexidine gluconate) Soap before surgery.  CHG is an antiseptic cleaner which kills germs and bonds with the skin to continue killing germs even after washing.    Oral Hygiene is also important to reduce your risk of infection.  Remember - BRUSH YOUR TEETH THE MORNING OF SURGERY WITH YOUR REGULAR TOOTHPASTE  Please do not use if you have an allergy to CHG or antibacterial soaps. If your skin becomes reddened/irritated stop using the CHG.  Do not shave (including legs and underarms) for at least 48 hours prior to first CHG shower. It is OK to shave your face.  Please follow these instructions carefully.   1. Shower the NIGHT BEFORE SURGERY and the MORNING OF SURGERY with CHG.   2. If you chose to wash your hair, wash your hair first as usual with your normal shampoo.  3. After you shampoo, rinse your hair and body thoroughly to remove the shampoo.  4. Use CHG as you would any other liquid soap. You can apply CHG directly to the skin and wash gently with a scrungie or a clean washcloth.   5. Apply the CHG Soap to your body ONLY FROM THE NECK DOWN.  Do not use on open wounds or open sores. Avoid contact with your eyes, ears, mouth and genitals (private parts). Wash Face and genitals (private parts)  with your normal soap.  6. Wash thoroughly, paying special attention to the area where your surgery will be performed.  7. Thoroughly rinse your body with warm water from the neck down.  8. DO NOT shower/wash with your normal soap after using and rinsing off the CHG Soap.  9. Pat yourself dry with a CLEAN TOWEL.  10. Wear CLEAN PAJAMAS to bed the night before surgery, wear comfortable clothes the morning of surgery  11. Place CLEAN SHEETS on your bed the night of your first shower and DO NOT SLEEP WITH PETS.    Day of Surgery:  Do not apply any deodorants/lotions.  Please wear clean clothes to the  hospital/surgery center.   Remember to brush your teeth WITH YOUR REGULAR TOOTHPASTE.    Please read over the following fact sheets that you were given.

## 2019-01-11 ENCOUNTER — Encounter (HOSPITAL_COMMUNITY): Payer: Self-pay

## 2019-01-11 ENCOUNTER — Encounter (HOSPITAL_COMMUNITY)
Admission: RE | Admit: 2019-01-11 | Discharge: 2019-01-11 | Disposition: A | Discharge status: Home / Self Care | Payer: Medicare Other | Source: Ambulatory Visit | Attending: Neurosurgery | Admitting: Neurosurgery

## 2019-01-11 DIAGNOSIS — R9431 Abnormal electrocardiogram [ECG] [EKG]: Secondary | ICD-10-CM | POA: Diagnosis not present

## 2019-01-11 DIAGNOSIS — Z01818 Encounter for other preprocedural examination: Secondary | ICD-10-CM | POA: Diagnosis not present

## 2019-01-11 DIAGNOSIS — I10 Essential (primary) hypertension: Secondary | ICD-10-CM | POA: Diagnosis not present

## 2019-01-11 DIAGNOSIS — R001 Bradycardia, unspecified: Secondary | ICD-10-CM | POA: Insufficient documentation

## 2019-01-11 LAB — BASIC METABOLIC PANEL
Anion gap: 11 (ref 5–15)
BUN: 13 mg/dL (ref 8–23)
CO2: 25 mmol/L (ref 22–32)
Calcium: 9.2 mg/dL (ref 8.9–10.3)
Chloride: 103 mmol/L (ref 98–111)
Creatinine, Ser: 0.78 mg/dL (ref 0.61–1.24)
GFR calc Af Amer: >60 mL/min (ref >60)
GFR calc non Af Amer: >60 mL/min (ref >60)
Glucose, Bld: 152 mg/dL — ABNORMAL HIGH (ref 70–99)
POTASSIUM: 4 mmol/L (ref 3.5–5.1)
Sodium: 139 mmol/L (ref 135–145)

## 2019-01-11 LAB — ABO/RH: ABO/RH(D): O POS

## 2019-01-11 LAB — CBC
HCT: 43.3 % (ref 39.0–52.0)
Hemoglobin: 14.4 g/dL (ref 13.0–17.0)
MCH: 30.3 pg (ref 26.0–34.0)
MCHC: 33.3 g/dL (ref 30.0–36.0)
MCV: 91.2 fL (ref 80.0–100.0)
Platelets: 138 10*3/uL — ABNORMAL LOW (ref 150–400)
RBC: 4.75 MIL/uL (ref 4.22–5.81)
RDW: 13.4 % (ref 11.5–15.5)
WBC: 5 10*3/uL (ref 4.0–10.5)
nRBC: 0 % (ref 0.0–0.2)

## 2019-01-11 LAB — TYPE AND SCREEN
ABO/RH(D): O POS
Antibody Screen: NEGATIVE

## 2019-01-11 LAB — HEMOGLOBIN A1C
Hgb A1c MFr Bld: 7.3 % — ABNORMAL HIGH (ref 4.8–5.6)
Mean Plasma Glucose: 162.81 mg/dL

## 2019-01-11 LAB — SURGICAL PCR SCREEN
MRSA, PCR: NEGATIVE
STAPHYLOCOCCUS AUREUS: NEGATIVE

## 2019-01-11 LAB — GLUCOSE, CAPILLARY: Glucose-Capillary: 148 mg/dL — ABNORMAL HIGH (ref 70–99)

## 2019-01-11 MED ORDER — CHLORHEXIDINE GLUCONATE CLOTH 2 % EX PADS: 6.0000 | MEDICATED_PAD | Freq: Once | CUTANEOUS | Status: DC

## 2019-01-11 NOTE — Progress Notes (Signed)
Pt had stress test 2019. I have requested that from Dr [facility] office.

## 2019-01-11 NOTE — Progress Notes (Signed)
PCP - Dr. Pleas Koch in [place] - Dr Domenic Polite  Chest x-ray - denies EKG - 01/11/19 Stress Test - 2019   Requested from Dr Domenic Polite ECHO - 2018 Cardiac Cath - denies Sleep Study - denies Fasting Blood Sugar - 148 Checks Blood Sugar __prn___ times a day  Blood Thinner Instructions:none Aspirin Instructions:none  Anesthesia review: review EKG,and receive stress test  Patient denies shortness of breath, fever, cough and chest pain at PAT appointment....   Patient verbalized understanding of instructions that were given to them at the PAT appointment. Patient was also instructed that they will need to review over the PAT instructions again at home before surgery.

## 2019-01-12 NOTE — Progress Notes (Addendum)
Anesthesia Chart Review:  Case:  [id number] Date/Time:  01/18/19 0715   Procedure:  LUMBAR 3- SACRAL 1 DECOMPRESSION, POSTERIOR LUMBAR INTERBODY FUSION, POSTERIOR LATERAL ARTHRODESIS (N/A Back) - LUMBAR 3- SACRAL 1 DECOMPRESSION, POSTERIOR LUMBAR INTERBODY FUSION, POSTERIOR LATERAL ARTHRODESIS   Anesthesia type:  General   Pre-op diagnosis:  SPINAL STENOSIS, LUMBAR REGION WITH NEUROGENIC CLAUDICATION   Location:  [facility] ROOM 20 / [facility]   Surgeon:  Jovita Gamma, MD      DISCUSSION: Patient is a 72 year old male scheduled for the above procedure.  History includes former smoker (quit '04), post-operative N/V, PAD (left CIA stent 11/09/03), CAD (non-obstructive 2006), HLD, HTN, DM2, hypothyroidism, carotid artery disease, RLE melanoma excision, neck surgery.  Patient was seen by his cardiologist Dr. Domenic Polite in 07/2018 and had a non-ischemic stress test that same month. Carotid disease was stable in August as well. He denied chest pain, SOB, cough, and fever at PAT per RN notation. If no acute changes then I would anticipate that he can proceed as planned.   VS: BP (!) 133/51   Pulse (!) 51   Temp 36.5 C   Resp 20   Ht 5\' 8"  (1.727 m)   Wt 83.8 kg   SpO2 97%   BMI 28.08 kg/m    PROVIDERS: Burdine, Virgina Evener, MD is PCP in [place], [place] Rozann Lesches, MD is cardiologist. Last visit 08/15/18. Non-ischemic stres test earlier that month. Follow-up carotid dopplers ordered which showed stable moderate carotid stenosis with one year follow-up recommended.   LABS: Labs reviewed: Acceptable for surgery. (all labs ordered are listed, but only abnormal results are displayed)  Labs Reviewed  GLUCOSE, CAPILLARY - Abnormal; Notable for the following components:      Result Value   Glucose-Capillary 148 (*)    All other components within normal limits  CBC - Abnormal; Notable for the following components:   Platelets 138 (*)    All other components within normal limits  BASIC METABOLIC PANEL -  Abnormal; Notable for the following components:   Glucose, Bld 152 (*)    All other components within normal limits  HEMOGLOBIN A1C - Abnormal; Notable for the following components:   Hgb A1c MFr Bld 7.3 (*)    All other components within normal limits  SURGICAL PCR SCREEN  TYPE AND SCREEN  ABO/RH    EKG: 01/11/19: Sinus bradycardia 54 bpm.  Nonspecific T wave abnormality.  Motion artifact in leads V1-V3 make it difficult to evaluate for ST/T wave abnormality in theses leads.  On baseline EKG tracing from 07/29/2018 nuclear stress test he had nonspecific T wave flattening in these leads.    CV: Carotid US 08/25/18: Final Interpretation: Right Carotid: Velocities in the right ICA are consistent with a 60-79%                stenosis. Left Carotid: Velocities in the left ICA are consistent with a 60-79% stenosis. Vertebrals:  Bilateral vertebral arteries demonstrate antegrade flow. Subclavians: Normal flow hemodynamics were seen in bilateral subclavian              arteries. *See table(s) above for measurements and observations. Suggest follow up study in 12 months.   Nuclear stress test 07/29/2018 [facility]): Impression:  1.  No reversible ischemia or infarction. 2.  Normal left ventricular wall motion. 3.  Left ventricular ejection fraction 72%. 4.  Noninvasive risk stratification: Low.   Echo 12/16/2017 [facility]): Conclusion: 1.  Mild concentric left ventricular perjury.  2.  The estimated ejection fraction of 55 to 60%. 3.  Normal left ventricular diastolic filling. 4.  The left atrium is mildly dilated. 5.  The right ventricular global systolic function is normal.   Cardiac cath 05/19/05: CORONARY ARTERIOGRAPHY: 1.  Left main is normal. 2.  Left anterior descending artery has a 20% stenosis in the proximal      vessel. In the mid-vessel, there is a 30% stenosis and in the distal      vessel there is a 20% stenosis. The LAD gives rise to a small first and       second diagonal branches and a small to normal size third diagonal      branch. There is a 70% stenosis in the ostium at a third diagonal      branch. 3.  Left circumflex gives rise to a large first obtuse marginal and a normal      size second obtuse marginal branch. There is a 20% stenosis in the      proximal circumflex. The first obtuse marginal has a diffuse 70%      stenosis beginning at its origin and extending into the proximal vessel.      The third obtuse marginal branch has a 30% stenosis proximally. 4.  Right coronary artery is a dominant vessel. There is a 50% stenosis in      the proximal vessel, 20% in the midvessel. The distal right coronary      artery gives rise to a normal size posterior descending artery and a      small posterolateral branch. There is a 20% stenosis in the posterior      descending artery. IMPRESSION: 1.  Normal left ventricular systolic function with question of mitral      regurgitation. 2.  Atherosclerosis of the abdominal aorta with a patent stent in the left      iliac artery. 3.  Two vessel coronary artery disease characterized by stenoses of      borderline severity involving a large obtuse marginal branch and a small      to normal size diagonal branch. There is moderate but nonobstructive      disease in the right coronary artery. PLAN:  We will proceed with a stress nuclear study to identify and localize ischemia. If ischemia is demonstrated in the distribution of the obtuse marginal branch, then would proceed with percutaneous coronary intervention of this vessel. If there is no significant ischemia on this study, then would recommend medical therapy.   Past Medical History:  Diagnosis Date  . Arthritis   . CAD (coronary artery disease)    Branch vessel and moderate RCA disease 2006  . Cancer ([place]) 1990   Melanoma Lower right Leg  . Carotid artery disease ([place])   . Diabetes ([place])   . Essential hypertension   . Hyperlipidemia   .  Hypothyroidism   . Lyme disease   . PAD (peripheral artery disease) ([place])    Left common iliac stent 2004  . PONV (postoperative nausea and vomiting)     Past Surgical History:  Procedure Laterality Date  . CATARACT EXTRACTION Bilateral   . CATARACT EXTRACTION, BILATERAL    . CERVICAL DISC SURGERY    . COLONOSCOPY N/A 11/06/2016   Procedure: COLONOSCOPY;  Surgeon: Daneil Dolin, MD;  Location: [facility];  Service: Endoscopy;  Laterality: N/A;  7:30 AM  . COLONOSCOPY  2012   Dr. Anthony Sar: normal. reviewed reports, which states he has  a history of polyps in remote past.   . EYE SURGERY    . JOINT REPLACEMENT    . MELANOMA EXCISION     right leg, seen at [facility] and underwent immunotherapy  . NECK SURGERY    . REPLACEMENT TOTAL KNEE      MEDICATIONS: . aspirin EC 81 MG tablet  . atorvastatin (LIPITOR) 20 MG tablet  . brimonidine (ALPHAGAN) 0.2 % ophthalmic solution  . brimonidine-timolol (COMBIGAN) 0.2-0.5 % ophthalmic solution  . dorzolamide-timolol (COSOPT) 22.3-6.8 MG/ML ophthalmic solution  . fluticasone (FLONASE ALLERGY RELIEF) 50 MCG/ACT nasal spray  . levothyroxine (SYNTHROID, LEVOTHROID) 150 MCG tablet  . levothyroxine (SYNTHROID, LEVOTHROID) 175 MCG tablet  . Loteprednol Etabonate (INVELTYS) 1 % SUSP  . metFORMIN (GLUCOPHAGE) 500 MG tablet  . methocarbamol (ROBAXIN) 500 MG tablet  . metoprolol tartrate (LOPRESSOR) 25 MG tablet  . mometasone (ELOCON) 0.1 % ointment  . nabumetone (RELAFEN) 500 MG tablet  . naproxen sodium (ANAPROX) 220 MG tablet  . nitroGLYCERIN (NITROSTAT) 0.4 MG SL tablet  . telmisartan-hydrochlorothiazide (MICARDIS HCT) 80-25 MG per tablet  . traMADol (ULTRAM) 50 MG tablet   No current facility-administered medications for this encounter.   Reported last ASA before surgery 01/10/19.   Myra Gianotti, PA-C Surgical Short Stay/Anesthesiology [facility] Phone [phone] [facility] Phone [phone] 01/13/2019 9:57 AM

## 2019-01-12 NOTE — Anesthesia Preprocedure Evaluation (Addendum)
Anesthesia Evaluation  Patient identified by MRN, date of birth, ID band Patient awake    Reviewed: Allergy & Precautions, Patient's Chart, lab work & pertinent test results, reviewed documented beta blocker date and time   History of Anesthesia Complications (+) PONV and history of anesthetic complications  Airway Mallampati: III  TM Distance: >3 FB Neck ROM: Full    Dental  (+) Edentulous Upper, Missing   Pulmonary former smoker,    Pulmonary exam normal breath sounds clear to auscultation       Cardiovascular hypertension, Pt. on home beta blockers and Pt. on medications + CAD and + Peripheral Vascular Disease  Normal cardiovascular exam Rhythm:Regular Rate:Normal  Carotid artery disease  Patient was seen by his cardiologist Dr. Domenic Polite in 07/2018 and had a non-ischemic stress test that same month  Nuclear stress test 07/29/2018 [facility]): Impression:  1.  No reversible ischemia or infarction. 2.  Normal left ventricular wall motion. 3.  Left ventricular ejection fraction 72%. 4.  Noninvasive risk stratification: Low.Nuclear stress test 07/29/2018 [facility]): Impression:  1.  No reversible ischemia or infarction. 2.  Normal left ventricular wall motion. 3.  Left ventricular ejection fraction 72%. 4.  Noninvasive risk stratification: Low.Nuclear stress test    Neuro/Psych Per Coralyn Pear (Ophthomology) Anterior uveitis   Pigmentary glaucoma of left eye, mild stage  Uveitis-hyphema-glaucoma syndrome of left eye   Retinal edema OCT, Retina - OU - Both Eyes Pseudophakia of both eyes       negative psych ROS   GI/Hepatic negative GI ROS, Neg liver ROS,   Endo/Other  diabetes, Oral Hypoglycemic AgentsHypothyroidism   Renal/GU negative Renal ROS     Musculoskeletal negative musculoskeletal ROS (+)   Abdominal   Peds  Hematology HLD   Anesthesia Other Findings SPINAL STENOSIS, LUMBAR REGION WITH  NEUROGENIC CLAUDICATION  Reproductive/Obstetrics                           Anesthesia Physical Anesthesia Plan  ASA: III  Anesthesia Plan: General   Post-op Pain Management:    Induction: Intravenous  PONV Risk Score and Plan: 4 or greater and Midazolam, Dexamethasone, Ondansetron and Treatment may vary due to age or medical condition  Airway Management Planned: Oral ETT  Additional Equipment: Arterial line  Intra-op Plan:   Post-operative Plan: Extubation in OR  Informed Consent: I have reviewed the patients History and Physical, chart, labs and discussed the procedure including the risks, benefits and alternatives for the proposed anesthesia with the patient or authorized representative who has indicated his/her understanding and acceptance.     Dental advisory given  Plan Discussed with: CRNA  Anesthesia Plan Comments: (Reviewed PAT note written by Myra Gianotti, PA-C. )     Anesthesia Quick Evaluation

## 2019-01-18 ENCOUNTER — Inpatient Hospital Stay (HOSPITAL_COMMUNITY): Payer: Medicare Other

## 2019-01-18 ENCOUNTER — Inpatient Hospital Stay (HOSPITAL_COMMUNITY): Payer: Medicare Other | Admitting: Vascular Surgery

## 2019-01-18 ENCOUNTER — Inpatient Hospital Stay (HOSPITAL_COMMUNITY)
Admission: RE | Admit: 2019-01-18 | Discharge: 2019-01-20 | DRG: 455 | Disposition: A | Discharge status: Home / Self Care | Payer: Medicare Other | Attending: Neurosurgery | Admitting: Neurosurgery

## 2019-01-18 ENCOUNTER — Inpatient Hospital Stay (HOSPITAL_COMMUNITY): Payer: Medicare Other | Admitting: Anesthesiology

## 2019-01-18 ENCOUNTER — Encounter (HOSPITAL_COMMUNITY): Payer: Self-pay | Admitting: *Deleted

## 2019-01-18 ENCOUNTER — Inpatient Hospital Stay (HOSPITAL_COMMUNITY)
Admission: RE | Disposition: A | Discharge status: Home / Self Care | Payer: Self-pay | Source: Home / Self Care | Attending: Neurosurgery

## 2019-01-18 DIAGNOSIS — Z7982 Long term (current) use of aspirin: Secondary | ICD-10-CM | POA: Diagnosis not present

## 2019-01-18 DIAGNOSIS — Z79890 Hormone replacement therapy (postmenopausal): Secondary | ICD-10-CM | POA: Diagnosis not present

## 2019-01-18 DIAGNOSIS — Z981 Arthrodesis status: Secondary | ICD-10-CM | POA: Diagnosis not present

## 2019-01-18 DIAGNOSIS — Z91013 Allergy to seafood: Secondary | ICD-10-CM | POA: Diagnosis not present

## 2019-01-18 DIAGNOSIS — I10 Essential (primary) hypertension: Secondary | ICD-10-CM | POA: Diagnosis present

## 2019-01-18 DIAGNOSIS — Z8371 Family history of colonic polyps: Secondary | ICD-10-CM

## 2019-01-18 DIAGNOSIS — I2510 Atherosclerotic heart disease of native coronary artery without angina pectoris: Secondary | ICD-10-CM | POA: Diagnosis present

## 2019-01-18 DIAGNOSIS — Z87891 Personal history of nicotine dependence: Secondary | ICD-10-CM | POA: Diagnosis not present

## 2019-01-18 DIAGNOSIS — M4317 Spondylolisthesis, lumbosacral region: Secondary | ICD-10-CM | POA: Diagnosis present

## 2019-01-18 DIAGNOSIS — E785 Hyperlipidemia, unspecified: Secondary | ICD-10-CM | POA: Diagnosis present

## 2019-01-18 DIAGNOSIS — Z85820 Personal history of malignant melanoma of skin: Secondary | ICD-10-CM

## 2019-01-18 DIAGNOSIS — Z79899 Other long term (current) drug therapy: Secondary | ICD-10-CM

## 2019-01-18 DIAGNOSIS — M48062 Spinal stenosis, lumbar region with neurogenic claudication: Secondary | ICD-10-CM | POA: Diagnosis present

## 2019-01-18 DIAGNOSIS — M47816 Spondylosis without myelopathy or radiculopathy, lumbar region: Secondary | ICD-10-CM | POA: Diagnosis present

## 2019-01-18 DIAGNOSIS — Z881 Allergy status to other antibiotic agents status: Secondary | ICD-10-CM

## 2019-01-18 DIAGNOSIS — M4316 Spondylolisthesis, lumbar region: Secondary | ICD-10-CM | POA: Diagnosis present

## 2019-01-18 DIAGNOSIS — E1151 Type 2 diabetes mellitus with diabetic peripheral angiopathy without gangrene: Secondary | ICD-10-CM | POA: Diagnosis present

## 2019-01-18 DIAGNOSIS — M5136 Other intervertebral disc degeneration, lumbar region: Secondary | ICD-10-CM | POA: Diagnosis present

## 2019-01-18 DIAGNOSIS — E039 Hypothyroidism, unspecified: Secondary | ICD-10-CM | POA: Diagnosis present

## 2019-01-18 DIAGNOSIS — Z419 Encounter for procedure for purposes other than remedying health state, unspecified: Secondary | ICD-10-CM

## 2019-01-18 DIAGNOSIS — Z8249 Family history of ischemic heart disease and other diseases of the circulatory system: Secondary | ICD-10-CM | POA: Diagnosis not present

## 2019-01-18 DIAGNOSIS — Z7984 Long term (current) use of oral hypoglycemic drugs: Secondary | ICD-10-CM | POA: Diagnosis not present

## 2019-01-18 LAB — GLUCOSE, CAPILLARY
Glucose-Capillary: 153 mg/dL — ABNORMAL HIGH (ref 70–99)
Glucose-Capillary: 168 mg/dL — ABNORMAL HIGH (ref 70–99)
Glucose-Capillary: 225 mg/dL — ABNORMAL HIGH (ref 70–99)
Glucose-Capillary: 237 mg/dL — ABNORMAL HIGH (ref 70–99)

## 2019-01-18 SURGERY — POSTERIOR LUMBAR FUSION 3 LEVEL
Anesthesia: General | Site: Back

## 2019-01-18 MED ORDER — LIDOCAINE-EPINEPHRINE 1 %-1:100000 IJ SOLN
INTRAMUSCULAR | Status: AC
  Filled 2019-01-18: qty 1

## 2019-01-18 MED ORDER — ONDANSETRON HCL 4 MG/2ML IJ SOLN
INTRAMUSCULAR | Status: AC
  Filled 2019-01-18: qty 2

## 2019-01-18 MED ORDER — ACETAMINOPHEN 10 MG/ML IV SOLN
INTRAVENOUS | Status: DC | PRN
  Administered 2019-01-18: 1000 mg via INTRAVENOUS

## 2019-01-18 MED ORDER — FENTANYL CITRATE (PF) 250 MCG/5ML IJ SOLN
INTRAMUSCULAR | Status: AC
  Filled 2019-01-18: qty 5

## 2019-01-18 MED ORDER — THROMBIN 20000 UNITS EX SOLR
CUTANEOUS | Status: DC | PRN
  Administered 2019-01-18 (×2): 20 mL via TOPICAL

## 2019-01-18 MED ORDER — THROMBIN 20000 UNITS EX SOLR
CUTANEOUS | Status: AC
  Filled 2019-01-18: qty 20000

## 2019-01-18 MED ORDER — BISACODYL 10 MG RE SUPP
10.0000 mg | Freq: Every day | RECTAL | Status: DC | PRN
  Administered 2019-01-19: 10 mg via RECTAL
  Filled 2019-01-18: qty 1

## 2019-01-18 MED ORDER — NITROGLYCERIN 0.4 MG SL SUBL: 0.4000 mg | SUBLINGUAL_TABLET | SUBLINGUAL | Status: DC | PRN

## 2019-01-18 MED ORDER — HYDROCHLOROTHIAZIDE 25 MG PO TABS
25.0000 mg | ORAL_TABLET | Freq: Every day | ORAL | Status: DC
  Administered 2019-01-20: 25 mg via ORAL
  Filled 2019-01-18: qty 1

## 2019-01-18 MED ORDER — LIDOCAINE 2% (20 MG/ML) 5 ML SYRINGE
INTRAMUSCULAR | Status: DC | PRN
  Administered 2019-01-18: 60 mg via INTRAVENOUS

## 2019-01-18 MED ORDER — PROPOFOL 10 MG/ML IV BOLUS
INTRAVENOUS | Status: DC | PRN
  Administered 2019-01-18 (×2): 100 mg via INTRAVENOUS

## 2019-01-18 MED ORDER — ACETAMINOPHEN 10 MG/ML IV SOLN
INTRAVENOUS | Status: AC
  Filled 2019-01-18: qty 100

## 2019-01-18 MED ORDER — ROCURONIUM BROMIDE 50 MG/5ML IV SOSY
PREFILLED_SYRINGE | INTRAVENOUS | Status: AC
  Filled 2019-01-18: qty 5

## 2019-01-18 MED ORDER — HYDROXYZINE HCL 25 MG PO TABS: 50.0000 mg | ORAL_TABLET | ORAL | Status: DC | PRN

## 2019-01-18 MED ORDER — MAGNESIUM HYDROXIDE 400 MG/5ML PO SUSP
30.0000 mL | Freq: Every day | ORAL | Status: DC | PRN
  Administered 2019-01-19: 30 mL via ORAL
  Filled 2019-01-18: qty 30

## 2019-01-18 MED ORDER — METFORMIN HCL 500 MG PO TABS
500.0000 mg | ORAL_TABLET | Freq: Every day | ORAL | Status: DC
  Administered 2019-01-19 – 2019-01-20 (×2): 500 mg via ORAL
  Filled 2019-01-18 (×2): qty 1

## 2019-01-18 MED ORDER — INSULIN ASPART 100 UNIT/ML ~~LOC~~ SOLN
0.0000 [IU] | Freq: Every day | SUBCUTANEOUS | Status: DC
  Administered 2019-01-18: 2 [IU] via SUBCUTANEOUS

## 2019-01-18 MED ORDER — SODIUM CHLORIDE 0.9 % IV SOLN
INTRAVENOUS | Status: DC | PRN
  Administered 2019-01-18: 15 ug/min via INTRAVENOUS

## 2019-01-18 MED ORDER — DORZOLAMIDE HCL-TIMOLOL MAL 2-0.5 % OP SOLN
1.0000 [drp] | Freq: Two times a day (BID) | OPHTHALMIC | Status: DC
  Filled 2019-01-18: qty 10

## 2019-01-18 MED ORDER — DEXAMETHASONE SODIUM PHOSPHATE 10 MG/ML IJ SOLN
INTRAMUSCULAR | Status: DC | PRN
  Administered 2019-01-18: 5 mg via INTRAVENOUS

## 2019-01-18 MED ORDER — THROMBIN 5000 UNITS EX SOLR
OROMUCOSAL | Status: DC | PRN
  Administered 2019-01-18: 5 mL via TOPICAL

## 2019-01-18 MED ORDER — PROPOFOL 10 MG/ML IV BOLUS
INTRAVENOUS | Status: AC
  Filled 2019-01-18: qty 20

## 2019-01-18 MED ORDER — ACETAMINOPHEN 325 MG PO TABS: 650.0000 mg | ORAL_TABLET | ORAL | Status: DC | PRN

## 2019-01-18 MED ORDER — POTASSIUM CHLORIDE IN NACL 20-0.9 MEQ/L-% IV SOLN
INTRAVENOUS | Status: DC
  Administered 2019-01-18: 19:00:00 via INTRAVENOUS
  Filled 2019-01-18: qty 1000

## 2019-01-18 MED ORDER — SODIUM CHLORIDE 0.9% FLUSH
3.0000 mL | Freq: Two times a day (BID) | INTRAVENOUS | Status: DC
  Administered 2019-01-18 – 2019-01-19 (×2): 3 mL via INTRAVENOUS

## 2019-01-18 MED ORDER — MORPHINE SULFATE (PF) 4 MG/ML IV SOLN: 4.0000 mg | INTRAVENOUS | Status: DC | PRN

## 2019-01-18 MED ORDER — SODIUM CHLORIDE 0.9 % IV SOLN: 250.0000 mL | INTRAVENOUS | Status: DC

## 2019-01-18 MED ORDER — ROCURONIUM BROMIDE 50 MG/5ML IV SOSY
PREFILLED_SYRINGE | INTRAVENOUS | Status: DC | PRN
  Administered 2019-01-18 (×2): 50 mg via INTRAVENOUS
  Administered 2019-01-18: 15 mg via INTRAVENOUS
  Administered 2019-01-18: 50 mg via INTRAVENOUS

## 2019-01-18 MED ORDER — MENTHOL 3 MG MT LOZG: 1.0000 | LOZENGE | OROMUCOSAL | Status: DC | PRN

## 2019-01-18 MED ORDER — BRIMONIDINE TARTRATE 0.2 % OP SOLN
1.0000 [drp] | Freq: Two times a day (BID) | OPHTHALMIC | Status: DC
  Filled 2019-01-18: qty 5

## 2019-01-18 MED ORDER — BUPIVACAINE HCL (PF) 0.5 % IJ SOLN
INTRAMUSCULAR | Status: AC
  Filled 2019-01-18: qty 30

## 2019-01-18 MED ORDER — ALUM & MAG HYDROXIDE-SIMETH 200-200-20 MG/5ML PO SUSP
30.0000 mL | Freq: Four times a day (QID) | ORAL | Status: DC | PRN
  Filled 2019-01-18: qty 30

## 2019-01-18 MED ORDER — FLUTICASONE PROPIONATE 50 MCG/ACT NA SUSP
2.0000 | Freq: Every day | NASAL | Status: DC
  Filled 2019-01-18: qty 16

## 2019-01-18 MED ORDER — FENTANYL CITRATE (PF) 100 MCG/2ML IJ SOLN
INTRAMUSCULAR | Status: AC
  Administered 2019-01-18: 25 ug via INTRAVENOUS
  Filled 2019-01-18: qty 2

## 2019-01-18 MED ORDER — ALBUMIN HUMAN 5 % IV SOLN
INTRAVENOUS | Status: DC | PRN
  Administered 2019-01-18 (×2): via INTRAVENOUS

## 2019-01-18 MED ORDER — METHOCARBAMOL 500 MG PO TABS
500.0000 mg | ORAL_TABLET | Freq: Four times a day (QID) | ORAL | Status: DC | PRN
  Administered 2019-01-18 – 2019-01-20 (×3): 500 mg via ORAL
  Filled 2019-01-18 (×3): qty 1

## 2019-01-18 MED ORDER — PHENOL 1.4 % MT LIQD: 1.0000 | OROMUCOSAL | Status: DC | PRN

## 2019-01-18 MED ORDER — PHENYLEPHRINE HCL 10 MG/ML IJ SOLN
INTRAMUSCULAR | Status: DC | PRN
  Administered 2019-01-18: 100 ug via INTRAVENOUS

## 2019-01-18 MED ORDER — HYDROXYZINE HCL 50 MG/ML IM SOLN: 50.0000 mg | INTRAMUSCULAR | Status: DC | PRN

## 2019-01-18 MED ORDER — INSULIN ASPART 100 UNIT/ML ~~LOC~~ SOLN
0.0000 [IU] | Freq: Three times a day (TID) | SUBCUTANEOUS | Status: DC
  Administered 2019-01-19 – 2019-01-20 (×5): 3 [IU] via SUBCUTANEOUS

## 2019-01-18 MED ORDER — ONDANSETRON HCL 4 MG/2ML IJ SOLN: 4.0000 mg | Freq: Once | INTRAMUSCULAR | Status: DC | PRN

## 2019-01-18 MED ORDER — ONDANSETRON HCL 4 MG/2ML IJ SOLN
INTRAMUSCULAR | Status: DC | PRN
  Administered 2019-01-18 (×2): 4 mg via INTRAVENOUS

## 2019-01-18 MED ORDER — SODIUM CHLORIDE 0.9 % IV SOLN
INTRAVENOUS | Status: DC | PRN
  Administered 2019-01-18 (×2): 500 mL

## 2019-01-18 MED ORDER — SODIUM CHLORIDE 0.9% FLUSH: 3.0000 mL | INTRAVENOUS | Status: DC | PRN

## 2019-01-18 MED ORDER — ACETAMINOPHEN 500 MG PO TABS
1000.0000 mg | ORAL_TABLET | Freq: Once | ORAL | Status: AC
  Administered 2019-01-18: 1000 mg via ORAL
  Filled 2019-01-18: qty 2

## 2019-01-18 MED ORDER — SODIUM CHLORIDE 0.9 % IV SOLN
INTRAVENOUS | Status: DC | PRN
  Administered 2019-01-18: 12:00:00 via INTRAVENOUS

## 2019-01-18 MED ORDER — KETOROLAC TROMETHAMINE 30 MG/ML IJ SOLN
15.0000 mg | Freq: Four times a day (QID) | INTRAMUSCULAR | Status: AC
  Administered 2019-01-18 – 2019-01-20 (×6): 15 mg via INTRAVENOUS
  Filled 2019-01-18 (×6): qty 1

## 2019-01-18 MED ORDER — LACTATED RINGERS IV SOLN
INTRAVENOUS | Status: DC | PRN
  Administered 2019-01-18: 08:00:00 via INTRAVENOUS

## 2019-01-18 MED ORDER — ROCURONIUM BROMIDE 50 MG/5ML IV SOSY
PREFILLED_SYRINGE | INTRAVENOUS | Status: AC
  Filled 2019-01-18: qty 10

## 2019-01-18 MED ORDER — LEVOTHYROXINE SODIUM 150 MCG PO TABS
150.0000 ug | ORAL_TABLET | ORAL | Status: DC
  Administered 2019-01-19: 150 ug via ORAL
  Filled 2019-01-18: qty 1
  Filled 2019-01-18 (×3): qty 2

## 2019-01-18 MED ORDER — CYCLOBENZAPRINE HCL 5 MG PO TABS: 5.0000 mg | ORAL_TABLET | Freq: Three times a day (TID) | ORAL | Status: DC | PRN

## 2019-01-18 MED ORDER — ATORVASTATIN CALCIUM 10 MG PO TABS
20.0000 mg | ORAL_TABLET | Freq: Every day | ORAL | Status: DC
  Administered 2019-01-19 – 2019-01-20 (×2): 20 mg via ORAL
  Filled 2019-01-18 (×2): qty 2

## 2019-01-18 MED ORDER — IRBESARTAN 300 MG PO TABS
300.0000 mg | ORAL_TABLET | Freq: Every day | ORAL | Status: DC
  Administered 2019-01-20: 300 mg via ORAL
  Filled 2019-01-18 (×2): qty 1

## 2019-01-18 MED ORDER — 0.9 % SODIUM CHLORIDE (POUR BTL) OPTIME
TOPICAL | Status: DC | PRN
  Administered 2019-01-18 (×3): 1000 mL

## 2019-01-18 MED ORDER — SUGAMMADEX SODIUM 200 MG/2ML IV SOLN
INTRAVENOUS | Status: DC | PRN
  Administered 2019-01-18: 200 mg via INTRAVENOUS

## 2019-01-18 MED ORDER — THROMBIN 5000 UNITS EX SOLR
CUTANEOUS | Status: AC
  Filled 2019-01-18: qty 5000

## 2019-01-18 MED ORDER — LEVOTHYROXINE SODIUM 175 MCG PO TABS
175.0000 ug | ORAL_TABLET | ORAL | Status: DC
  Administered 2019-01-20: 175 ug via ORAL
  Filled 2019-01-18: qty 1

## 2019-01-18 MED ORDER — EPHEDRINE SULFATE 50 MG/ML IJ SOLN
INTRAMUSCULAR | Status: DC | PRN
  Administered 2019-01-18 (×3): 10 mg via INTRAVENOUS
  Administered 2019-01-18 (×3): 5 mg via INTRAVENOUS

## 2019-01-18 MED ORDER — KETOROLAC TROMETHAMINE 30 MG/ML IJ SOLN
15.0000 mg | Freq: Once | INTRAMUSCULAR | Status: AC
  Administered 2019-01-18: 15 mg via INTRAVENOUS

## 2019-01-18 MED ORDER — DEXAMETHASONE SODIUM PHOSPHATE 10 MG/ML IJ SOLN
INTRAMUSCULAR | Status: AC
  Filled 2019-01-18: qty 1

## 2019-01-18 MED ORDER — LIDOCAINE-EPINEPHRINE 1 %-1:100000 IJ SOLN
INTRAMUSCULAR | Status: DC | PRN
  Administered 2019-01-18: 15 mL

## 2019-01-18 MED ORDER — KETOROLAC TROMETHAMINE 15 MG/ML IJ SOLN
INTRAMUSCULAR | Status: AC
  Filled 2019-01-18: qty 1

## 2019-01-18 MED ORDER — CEFAZOLIN SODIUM 1 G IJ SOLR
INTRAMUSCULAR | Status: AC
  Filled 2019-01-18: qty 40

## 2019-01-18 MED ORDER — CEFAZOLIN SODIUM-DEXTROSE 2-4 GM/100ML-% IV SOLN
2.0000 g | INTRAVENOUS | Status: AC
  Administered 2019-01-18 (×2): 2 g via INTRAVENOUS
  Filled 2019-01-18: qty 100

## 2019-01-18 MED ORDER — FLEET ENEMA 7-19 GM/118ML RE ENEM: 1.0000 | ENEMA | Freq: Once | RECTAL | Status: DC | PRN

## 2019-01-18 MED ORDER — TELMISARTAN-HCTZ 80-25 MG PO TABS: 1.0000 | ORAL_TABLET | Freq: Every day | ORAL | Status: DC

## 2019-01-18 MED ORDER — LOTEPREDNOL ETABONATE 1 % OP SUSP: 1.0000 [drp] | Freq: Two times a day (BID) | OPHTHALMIC | Status: DC

## 2019-01-18 MED ORDER — LACTATED RINGERS IV SOLN
INTRAVENOUS | Status: DC
  Administered 2019-01-18 (×3): via INTRAVENOUS

## 2019-01-18 MED ORDER — HYDROCODONE-ACETAMINOPHEN 5-325 MG PO TABS
1.0000 | ORAL_TABLET | ORAL | Status: DC | PRN
  Administered 2019-01-18: 1 via ORAL
  Administered 2019-01-18: 2 via ORAL
  Administered 2019-01-19 – 2019-01-20 (×8): 1 via ORAL
  Filled 2019-01-18 (×11): qty 1

## 2019-01-18 MED ORDER — FENTANYL CITRATE (PF) 100 MCG/2ML IJ SOLN
25.0000 ug | INTRAMUSCULAR | Status: DC | PRN
  Administered 2019-01-18: 25 ug via INTRAVENOUS

## 2019-01-18 MED ORDER — BUPIVACAINE HCL (PF) 0.5 % IJ SOLN
INTRAMUSCULAR | Status: DC | PRN
  Administered 2019-01-18: 15 mL

## 2019-01-18 MED ORDER — EPHEDRINE 5 MG/ML INJ
INTRAVENOUS | Status: AC
  Filled 2019-01-18: qty 10

## 2019-01-18 MED ORDER — FENTANYL CITRATE (PF) 100 MCG/2ML IJ SOLN
INTRAMUSCULAR | Status: DC | PRN
  Administered 2019-01-18 (×2): 50 ug via INTRAVENOUS
  Administered 2019-01-18: 100 ug via INTRAVENOUS
  Administered 2019-01-18 (×3): 50 ug via INTRAVENOUS

## 2019-01-18 MED ORDER — KETOROLAC TROMETHAMINE 30 MG/ML IJ SOLN
INTRAMUSCULAR | Status: AC
  Filled 2019-01-18: qty 1

## 2019-01-18 MED ORDER — GLYCOPYRROLATE 0.2 MG/ML IJ SOLN
INTRAMUSCULAR | Status: DC | PRN
  Administered 2019-01-18: 0.2 mg via INTRAVENOUS

## 2019-01-18 MED ORDER — ACETAMINOPHEN 650 MG RE SUPP: 650.0000 mg | RECTAL | Status: DC | PRN

## 2019-01-18 MED ORDER — METOPROLOL TARTRATE 25 MG PO TABS
25.0000 mg | ORAL_TABLET | Freq: Two times a day (BID) | ORAL | Status: DC
  Administered 2019-01-20: 25 mg via ORAL
  Filled 2019-01-18 (×2): qty 1

## 2019-01-18 MED ORDER — PHENYLEPHRINE 40 MCG/ML (10ML) SYRINGE FOR IV PUSH (FOR BLOOD PRESSURE SUPPORT)
PREFILLED_SYRINGE | INTRAVENOUS | Status: AC
  Filled 2019-01-18: qty 10

## 2019-01-18 MED ORDER — MIDAZOLAM HCL 2 MG/2ML IJ SOLN
INTRAMUSCULAR | Status: AC
  Filled 2019-01-18: qty 2

## 2019-01-18 SURGICAL SUPPLY — 86 items
ADH SKN CLS APL DERMABOND .7 (GAUZE/BANDAGES/DRESSINGS) ×2
BAG DECANTER FOR FLEXI CONT (MISCELLANEOUS) ×2 IMPLANT
BLADE CLIPPER SURG (BLADE) ×1 IMPLANT
BUR ACRON 5.0MM COATED (BURR) ×2 IMPLANT
BUR MATCHSTICK NEURO 3.0 LAGG (BURR) ×2 IMPLANT
CAGE POST LUM 11X23X9 6D (Cage) ×6 IMPLANT
CANISTER SUCT 3000ML PPV (MISCELLANEOUS) ×2 IMPLANT
CAP LCK SPNE (Orthopedic Implant) ×8 IMPLANT
CAP LOCK SPINE RADIUS (Orthopedic Implant) IMPLANT
CAP LOCKING (Orthopedic Implant) ×16 IMPLANT
CARTRIDGE OIL MAESTRO DRILL (MISCELLANEOUS) ×1 IMPLANT
CONT SPEC 4OZ CLIKSEAL STRL BL (MISCELLANEOUS) ×3 IMPLANT
COVER BACK TABLE 60X90IN (DRAPES) ×2 IMPLANT
COVER WAND RF STERILE (DRAPES) ×2 IMPLANT
CROSSLINK MEDIUM (Orthopedic Implant) ×2 IMPLANT
DECANTER SPIKE VIAL GLASS SM (MISCELLANEOUS) ×2 IMPLANT
DERMABOND ADVANCED (GAUZE/BANDAGES/DRESSINGS) ×2
DERMABOND ADVANCED .7 DNX12 (GAUZE/BANDAGES/DRESSINGS) ×2 IMPLANT
DIFFUSER DRILL AIR PNEUMATIC (MISCELLANEOUS) ×2 IMPLANT
DRAPE C-ARM 42X72 X-RAY (DRAPES) ×3 IMPLANT
DRAPE C-ARMOR (DRAPES) ×1 IMPLANT
DRAPE HALF SHEET 40X57 (DRAPES) ×1 IMPLANT
DRAPE LAPAROTOMY 100X72X124 (DRAPES) ×2 IMPLANT
DRAPE POUCH INSTRU U-SHP 10X18 (DRAPES) ×2 IMPLANT
DRAPE SURG 17X23 STRL (DRAPES) ×2 IMPLANT
ELECT BLADE 4.0 EZ CLEAN MEGAD (MISCELLANEOUS) ×2
ELECT REM PT RETURN 9FT ADLT (ELECTROSURGICAL) ×2
ELECTRODE BLDE 4.0 EZ CLN MEGD (MISCELLANEOUS) IMPLANT
ELECTRODE REM PT RTRN 9FT ADLT (ELECTROSURGICAL) ×1 IMPLANT
GAUZE 4X4 16PLY RFD (DISPOSABLE) ×1 IMPLANT
GAUZE SPONGE 4X4 12PLY STRL (GAUZE/BANDAGES/DRESSINGS) IMPLANT
GAUZE SPONGE 4X4 12PLY STRL LF (GAUZE/BANDAGES/DRESSINGS) ×1 IMPLANT
GLOVE BIOGEL PI IND STRL 7.0 (GLOVE) IMPLANT
GLOVE BIOGEL PI IND STRL 8 (GLOVE) ×2 IMPLANT
GLOVE BIOGEL PI INDICATOR 7.0 (GLOVE) ×2
GLOVE BIOGEL PI INDICATOR 8 (GLOVE) ×4
GLOVE ECLIPSE 7.5 STRL STRAW (GLOVE) ×7 IMPLANT
GOWN STRL REUS W/ TWL LRG LVL3 (GOWN DISPOSABLE) IMPLANT
GOWN STRL REUS W/ TWL XL LVL3 (GOWN DISPOSABLE) ×2 IMPLANT
GOWN STRL REUS W/TWL 2XL LVL3 (GOWN DISPOSABLE) ×2 IMPLANT
GOWN STRL REUS W/TWL LRG LVL3 (GOWN DISPOSABLE)
GOWN STRL REUS W/TWL XL LVL3 (GOWN DISPOSABLE) ×4
HEMOSTAT POWDER KIT SURGIFOAM (HEMOSTASIS) ×1 IMPLANT
KIT BASIN OR (CUSTOM PROCEDURE TRAY) ×2 IMPLANT
KIT INFUSE MEDIUM (Orthopedic Implant) ×1 IMPLANT
KIT TURNOVER KIT B (KITS) ×2 IMPLANT
MILL MEDIUM DISP (BLADE) ×1 IMPLANT
NDL 18GX1X1/2 (RX/OR ONLY) (NEEDLE) ×1 IMPLANT
NDL ASP BONE MRW 8GX15 (NEEDLE) IMPLANT
NDL HYPO 25X1 1.5 SAFETY (NEEDLE) ×1 IMPLANT
NDL SPNL 18GX3.5 QUINCKE PK (NEEDLE) ×1 IMPLANT
NDL SPNL 22GX3.5 QUINCKE BK (NEEDLE) ×1 IMPLANT
NEEDLE 18GX1X1/2 (RX/OR ONLY) (NEEDLE) IMPLANT
NEEDLE ASP BONE MRW 8GX15 (NEEDLE) ×2 IMPLANT
NEEDLE HYPO 25X1 1.5 SAFETY (NEEDLE) IMPLANT
NEEDLE SPNL 18GX3.5 QUINCKE PK (NEEDLE) ×2 IMPLANT
NEEDLE SPNL 22GX3.5 QUINCKE BK (NEEDLE) ×4 IMPLANT
NS IRRIG 1000ML POUR BTL (IV SOLUTION) ×5 IMPLANT
OIL CARTRIDGE MAESTRO DRILL (MISCELLANEOUS) ×2
PACK LAMINECTOMY NEURO (CUSTOM PROCEDURE TRAY) ×2 IMPLANT
PAD ARMBOARD 7.5X6 YLW CONV (MISCELLANEOUS) ×6 IMPLANT
PATTIES SURGICAL .5 X.5 (GAUZE/BANDAGES/DRESSINGS) IMPLANT
PATTIES SURGICAL .5 X1 (DISPOSABLE) ×3 IMPLANT
PATTIES SURGICAL .5 X3 (DISPOSABLE) ×1 IMPLANT
PATTIES SURGICAL 1X1 (DISPOSABLE) ×2 IMPLANT
ROD 80MM (Rod) ×4 IMPLANT
ROD SPNL 80X5.5 NS TI RDS (Rod) IMPLANT
SCREW 5.75 X 635 (Screw) ×2 IMPLANT
SCREW 5.75X40M (Screw) ×4 IMPLANT
SCREW 5.75X45MM (Screw) ×2 IMPLANT
SPONGE LAP 4X18 RFD (DISPOSABLE) ×1 IMPLANT
SPONGE NEURO XRAY DETECT 1X3 (DISPOSABLE) ×2 IMPLANT
SPONGE SURGIFOAM ABS GEL 100 (HEMOSTASIS) ×3 IMPLANT
STRIP BIOACTIVE VITOSS 25X100X (Neuro Prosthesis/Implant) ×2 IMPLANT
SUT PROLENE 6 0 BV (SUTURE) IMPLANT
SUT VIC AB 1 CT1 18XBRD ANBCTR (SUTURE) ×2 IMPLANT
SUT VIC AB 1 CT1 8-18 (SUTURE) ×6
SUT VIC AB 2-0 CP2 18 (SUTURE) ×5 IMPLANT
SYR 3ML LL SCALE MARK (SYRINGE) ×6 IMPLANT
SYR CONTROL 10ML LL (SYRINGE) ×2 IMPLANT
TAPE CLOTH SURG 4X10 WHT LF (GAUZE/BANDAGES/DRESSINGS) ×1 IMPLANT
TOWEL GREEN STERILE (TOWEL DISPOSABLE) ×2 IMPLANT
TOWEL GREEN STERILE FF (TOWEL DISPOSABLE) ×2 IMPLANT
TRAP SPECIMEN MUCOUS 40CC (MISCELLANEOUS) IMPLANT
TRAY FOLEY MTR SLVR 16FR STAT (SET/KITS/TRAYS/PACK) ×2 IMPLANT
WATER STERILE IRR 1000ML POUR (IV SOLUTION) ×2 IMPLANT

## 2019-01-18 NOTE — Anesthesia Procedure Notes (Signed)
Arterial Line Insertion Start/End1/22/2020 7:45 AM, 01/18/2019 8:00 AM Performed by: Glynda Jaeger, CRNA  Preanesthetic checklist: patient identified, IV checked, site marked, risks and benefits discussed, surgical consent, monitors and equipment checked, pre-op evaluation, timeout performed and anesthesia consent Lidocaine 1% used for infiltration Left, radial was placed Catheter size: 18 G Hand hygiene performed  and maximum sterile barriers used  Allen's test indicative of satisfactory collateral circulation Attempts: 2 Procedure performed without using ultrasound guided technique. Following insertion, dressing applied and Biopatch. Post procedure assessment: normal  Patient tolerated the procedure well with no immediate complications.

## 2019-01-18 NOTE — Anesthesia Procedure Notes (Signed)
Procedure Name: Intubation Date/Time: 01/18/2019 8:13 AM Performed by: Inda Coke, CRNA Pre-anesthesia Checklist: Patient identified, Emergency Drugs available, Suction available and Patient being monitored Patient Re-evaluated:Patient Re-evaluated prior to induction Oxygen Delivery Method: Circle System Utilized Preoxygenation: Pre-oxygenation with 100% oxygen Induction Type: IV induction Ventilation: Mask ventilation without difficulty and Oral airway inserted - appropriate to patient size Laryngoscope Size: Mac and 4 Grade View: Grade I Tube type: Oral Number of attempts: 1 Airway Equipment and Method: Stylet and Oral airway Placement Confirmation: ETT inserted through vocal cords under direct vision,  positive ETCO2 and breath sounds checked- equal and bilateral Secured at: 22 cm Tube secured with: Tape Dental Injury: Teeth and Oropharynx as per pre-operative assessment

## 2019-01-18 NOTE — Transfer of Care (Signed)
Immediate Anesthesia Transfer of Care Note  Patient: Christopher Randolph  Procedure(s) Performed: LUMBAR THREE-LUMBAR FOUR, LUMBAR FOUR-LUMBAR FIVE, LUMBAR FIVE-SACRAL ONE DECOMPRESSION, POSTERIOR LUMBAR INTERBODY FUSION, POSTERIOR LATERAL ARTHRODESIS (N/A Back)  Patient Location: PACU  Anesthesia Type:General  Level of Consciousness: awake and alert   Airway & Oxygen Therapy: Patient Spontanous Breathing and Patient connected to nasal cannula oxygen  Post-op Assessment: Report given to RN and Post -op Vital signs reviewed and stable  Post vital signs: Reviewed and stable  Last Vitals:  Vitals Value Taken Time  BP 105/57 01/18/2019  3:45 PM  Temp 36.4 C 01/18/2019  3:45 PM  Pulse 73 01/18/2019  3:56 PM  Resp 17 01/18/2019  3:56 PM  SpO2 95 % 01/18/2019  3:56 PM  Vitals shown include unvalidated device data.  Last Pain:  Vitals:   01/18/19 1545  TempSrc:   PainSc: Asleep      Patients Stated Pain Goal: 3 (82/64/15 8309)  Complications: No apparent anesthesia complications

## 2019-01-18 NOTE — Op Note (Signed)
01/18/2019  3:31 PM  PATIENT:  Christopher Randolph  73 y.o. male  PRE-OPERATIVE DIAGNOSIS: Multilevel, multifactorial lumbar stenosis with neurogenic claudication; degenerative lumbar spondylolisthesis; lumbar spondylosis; lumbar degenerative disc disease  POST-OPERATIVE DIAGNOSIS:  Multilevel, multifactorial lumbar stenosis with neurogenic claudication; degenerative lumbar spondylolisthesis; lumbar spondylosis; lumbar degenerative disc disease  PROCEDURE:  Procedure(s): L3-S1 decompressive lumbar laminectomy with bilateral L3-4, L4-5, and L5-S1 facetectomies and bilateral L3, L4, L5, and S1 foraminotomies for decompression of central canal, lateral recess, and neuroforaminal stenosis, with decompression beyond that required for interbody arthrodesis; bilateral L3-4, L4-5, and L5-S1 posterior lumbar interbody arthrodesis with Tritanium interbody implants, Vitoss BA with bone marrow aspirate, and infuse; bilateral L3-S1 posterior lateral arthrodesis with segmental radius posterior instrumentation, locally harvested morselized autograft, Vitoss BA with bone marrow aspirate, and infuse  SURGEON: Jovita Gamma, MD  ASSISTANTS: Sherley Bounds, MD  ANESTHESIA:   general  EBL:  Total I/O In: 3620 [I.V.:2800; Blood:320; IV Piggyback:500] Out: 11 [Urine:570; Blood:750]  BLOOD ADMINISTERED:320 CC CELLSAVER  COUNT:  Correct per nursing staff  DICTATION: Patient was brought to the operating room placed under general endotracheal anesthesia. The patient was turned to prone position, the lumbar region was prepped with Betadine soap and solution and draped in a sterile fashion. The midline was infiltrated with local anesthesia with epinephrine. A localizing x-ray was taken and then a midline incision was made and carried down through the subcutaneous tissue, bipolar cautery and electrocautery were used to maintain hemostasis. Dissection was carried down to the lumbar fascia. The fascia was incised  bilaterally and the paraspinal muscles were dissected with a spinous process and lamina in a subperiosteal fashion. Another x-ray was taken for localization and the L3-4, L4-5, and L5-S1 levels were localized. Dissection was then carried out laterally over the facet complexes and the transverse processes of L3, L4, L5 and the ala of S1 were exposed and decorticated.   We then proceeded with the decompression.  Laminectomy was performed using double-action rongeurs, the high-speed drill and Kerrison punches.  Bone from the laminectomy was saved, morselized today bone mill, and later reimplanted as locally harvested morselized autograft.,  The decompression was carried out laterally including facetectomy and foraminotomies with decompression of the stenotic compression of the L3, L4, L5, and S1 nerve roots. Once the decompression of the stenotic compression of the thecal sac and exiting nerve roots was completed we proceeded with the posterior lumbar interbody arthrodesis. The annulus at each level was incised bilaterally and the disc space entered. A thorough discectomy was performed using pituitary rongeurs and curettes. Once the discectomy was completed we began to prepare the endplate surfaces, removing the cartilaginous endplates surface. We then measured the height of the intervertebral disc space. We selected a pair of 10 x 23 x 6 Tritanium interbody implants for each level.  The C-arm fluoroscope was then draped and brought in the field and we identified the pedicle entry points bilaterally at the L3, L4, L5, and S1 levels. Each of the 8 pedicles was probed, we aspirated bone marrow aspirate from the vertebral bodies, this was injected over two 10 cc strips of Vitoss BA. Then each of the pedicles was examined with the ball probe, good bony surfaces were found and no bony cuts were found. Each of the pedicles was then tapped with a 5.25 mm tap, again examined with the ball probe good threading was found  and no bony cuts were found. We then placed 5.75 millimeter screws bilaterally at each level, using 40  mm screws at L3, 45 mm screws at L4, 40 mm screws at L5, and 35 mm screws at S1.  We then packed the interbody implants with Vitoss BA with bone marrow aspirate and infuse.  At each level we placed the first implant on the right side, carefully retracting the thecal sac and nerve root medially.  We then went back to the left side and packed the midline with additional Vitoss BA with bone marrow aspirate and infuse.  We then placed the second implant on the left side, again retracting the thecal sac and nerve root medially.  Additional Vitoss BA with bone marrow aspirate was packed lateral to each of the implants, at each level.  We then packed the lateral gutter over the transverse processes and intertransverse space with locally harvested morselized autograft, Vitoss BA with bone marrow aspirate, and infuse. We then selected 80 mm pre-lordosed rods, they were placed within the screw heads and secured with locking caps once all 8 locking caps were placed final tightening was performed against a counter torque.  We then placed medium cross connectors between the L3 and L4 screws and between the L5 and S1 screws.  Each of the cross connectors were secured and locked down to the rod, and then the central locking collar was tightened down.  The wound had been irrigated multiple times during the procedure with saline solution and bacitracin solution, good hemostasis was established with a combination of bipolar cautery and Gelfoam with thrombin.  The Gelfoam was removed, and a thin layer Surgifoam applied.  Once good hemostasis was confirmed we proceeded with closure paraspinal muscles deep fascia and Scarpa's fascia were closed with interrupted undyed 1 Vicryl sutures the subcutaneous and subcuticular closed with interrupted inverted 2-0 undyed Vicryl sutures the skin edges were approximated with Dermabond.  A  dressing of sterile gauze and Hypafix was applied.  Following surgery the patient was turned back to the supine position to be reversed and the anesthetic extubated and transferred to the recovery room for further care.   PLAN OF CARE: Admit to inpatient   PATIENT DISPOSITION:  PACU - hemodynamically stable.   Delay start of Pharmacological VTE agent (>24hrs) due to surgical blood loss or risk of bleeding:  yes

## 2019-01-18 NOTE — Progress Notes (Signed)
Orthopedic Tech Progress Note Patient Details:  Christopher Randolph June 29, 1946 [id number] Patient has brace   Patient ID: Christopher Randolph, male   DOB: [birth year], 73 y.o.   MRN: [id number]   Janit Pagan 01/18/2019, 6:06 PM

## 2019-01-18 NOTE — Anesthesia Postprocedure Evaluation (Signed)
Anesthesia Post Note  Patient: Christopher Randolph  Procedure(s) Performed: LUMBAR THREE-LUMBAR FOUR, LUMBAR FOUR-LUMBAR FIVE, LUMBAR FIVE-SACRAL ONE DECOMPRESSION, POSTERIOR LUMBAR INTERBODY FUSION, POSTERIOR LATERAL ARTHRODESIS (N/A Back)     Patient location during evaluation: PACU Anesthesia Type: General Level of consciousness: awake and alert Pain management: pain level controlled Vital Signs Assessment: post-procedure vital signs reviewed and stable Respiratory status: spontaneous breathing, nonlabored ventilation, respiratory function stable and patient connected to nasal cannula oxygen Cardiovascular status: blood pressure returned to baseline and stable Postop Assessment: no apparent nausea or vomiting Anesthetic complications: no    Last Vitals:  Vitals:   01/18/19 1727 01/18/19 1800  BP: (!) 98/46 (!) 115/50  Pulse: 66 72  Resp: 20 16  Temp:  36.7 C  SpO2: 93% 93%    Last Pain:  Vitals:   01/18/19 1800  TempSrc: Oral  PainSc:                  [name]

## 2019-01-18 NOTE — H&P (Signed)
Subjective: Patient is a 73 y.o. 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  We have treated the patient for more than a decade, but ESI's have become less effective.  Updated MRI shows progressive worsening of stenosis.  Patient is admitted now for lumbar decompression and arthrodesis including an L3-S1 decompressive lumbar laminectomy, bilateral facetectomies and foraminotomies at each level, L3-4, L4-5, and L5-S1 posterior lumbar interbody arthrodesis with interbody implants and bone graft, and L3-S1 posterior lateral arthrodesis with posterior instrumentation and bone graft.   Patient Active Problem List   Diagnosis Date Noted  . Heme positive stool 10/30/2016  . Systolic murmur   . Dizziness   . Carotid artery disease ([place])   . PAD (peripheral artery disease) ([place])   . CAD (coronary artery disease)   . Hypertension   . Hypothyroidism   . History of tobacco abuse   . Ejection fraction   . Dyslipidemia    Past Medical History:  Diagnosis Date  . Arthritis   . CAD (coronary artery disease)    Branch vessel and moderate RCA disease 2006  . Cancer ([place]) 1990   Melanoma Lower right Leg  . Carotid artery disease ([place])   . Diabetes ([place])   . Essential hypertension   . Hyperlipidemia   . Hypothyroidism   . Lyme disease   . PAD (peripheral artery disease) ([place])    Left common iliac stent 2004  . PONV (postoperative nausea and vomiting)     Past Surgical History:  Procedure Laterality Date  . CATARACT EXTRACTION Bilateral   . CATARACT EXTRACTION, BILATERAL    . CERVICAL DISC SURGERY    . COLONOSCOPY N/A 11/06/2016   Procedure: COLONOSCOPY;  Surgeon: Daneil Dolin, MD;  Location: [facility];  Service: Endoscopy;  Laterality: N/A;  7:30 AM  . COLONOSCOPY  2012   Dr. Anthony Sar: normal. reviewed  reports, which states he has a history of polyps in remote past.   . EYE SURGERY    . JOINT REPLACEMENT    . MELANOMA EXCISION     right leg, seen at [facility] and underwent immunotherapy  . NECK SURGERY    . REPLACEMENT TOTAL KNEE      Medications Prior to Admission  Medication Sig Dispense Refill Last Dose  . aspirin EC 81 MG tablet Take 81 mg by mouth daily.     Past Month at Unknown time  . atorvastatin (LIPITOR) 20 MG tablet TAKE ONE TABLET BY MOUTH DAILY (Patient taking differently: Take 20 mg by mouth daily. ) 90 tablet 3 01/18/2019 at Unknown time  . brimonidine (ALPHAGAN) 0.2 % ophthalmic solution Place 1 drop into the left eye 2 (two) times daily. 10 mL 3 01/18/2019 at Unknown time  . dorzolamide-timolol (COSOPT) 22.3-6.8 MG/ML ophthalmic solution Place 1 drop into the left eye 2 (two) times daily. 10 mL 3 01/18/2019 at Unknown time  . fluticasone (FLONASE ALLERGY RELIEF) 50 MCG/ACT nasal spray Place 2 sprays into both nostrils daily.    01/18/2019 at Unknown time  . levothyroxine (SYNTHROID, LEVOTHROID) 150 MCG tablet Take 150 mcg by mouth every other day.   1 01/17/2019 at Unknown time  . levothyroxine (SYNTHROID, LEVOTHROID) 175 MCG tablet Take 175 mcg by mouth every other day.    01/18/2019 at Unknown time  . Loteprednol Etabonate (INVELTYS) 1 % SUSP Place 1 drop into the left eye  2 (two) times daily. 2.8 mL 2 01/18/2019 at Unknown time  . metFORMIN (GLUCOPHAGE) 500 MG tablet Take 500 mg by mouth daily.    01/17/2019 at Unknown time  . methocarbamol (ROBAXIN) 500 MG tablet Take 500 mg by mouth at bedtime as needed (muscle spasms).    Past Month at Unknown time  . metoprolol tartrate (LOPRESSOR) 25 MG tablet Take 25 mg by mouth 2 (two) times daily.     01/18/2019 at 0430  . mometasone (ELOCON) 0.1 % ointment Apply 1 application topically 2 (two) times daily as needed (for psorasis).   01/17/2019 at Unknown time  . nabumetone (RELAFEN) 500 MG tablet Take 500 mg by mouth 2 (two) times daily as  needed (for inflammation).   Past Month at Unknown time  . naproxen sodium (ANAPROX) 220 MG tablet Take 440 mg by mouth daily as needed (for pain.).   Past Month at Unknown time  . nitroGLYCERIN (NITROSTAT) 0.4 MG SL tablet Place 1 tablet (0.4 mg total) under the tongue every 5 (five) minutes as needed for chest pain. 25 tablet 3 Taking  . telmisartan-hydrochlorothiazide (MICARDIS HCT) 80-25 MG per tablet Take 1 tablet by mouth daily.     01/18/2019 at Unknown time  . traMADol (ULTRAM) 50 MG tablet Take 50 mg by mouth every 6 (six) hours as needed. for pain  0 01/18/2019 at Unknown time  . brimonidine-timolol (COMBIGAN) 0.2-0.5 % ophthalmic solution Place 1 drop into the left eye 2 (two) times daily. 10 mL 4 Taking   Allergies  Allergen Reactions  . Azithromycin Swelling  . Shrimp [Shellfish Allergy] Other (See Comments)    Gout flares    Social History   Tobacco Use  . Smoking status: Former Smoker    Packs/day: 1.50    Years: 40.00    Pack years: 60.00    Types: Cigarettes    Start date: 11/04/1963    Last attempt to quit: 10/29/2003    Years since quitting: 15.2  . Smokeless tobacco: Never Used  Substance Use Topics  . Alcohol use: Yes    Alcohol/week: 0.0 standard drinks    Comment: one miller lite daily     Family History  Problem Relation Age of Onset  . Heart disease Mother   . Heart attack Mother   . Colon polyps Mother   . Heart attack Maternal Grandmother   . Heart attack Maternal Grandfather   . Colon polyps Brother   . Colon cancer Neg Hx      Review of Systems Pertinent items noted in HPI and remainder of comprehensive ROS otherwise negative.  Objective: Vital signs in last 24 hours: Temp:  [97.7 F (36.5 C)] 97.7 F (36.5 C) (01/22 0557) Pulse Rate:  [51] 51 (01/22 0557) Resp:  [18] 18 (01/22 0557) BP: (123)/(58) 123/58 (01/22 0557) SpO2:  [94 %] 94 % (01/22 0557) Weight:  [83.8 kg] 83.8 kg (01/22 0640)  EXAM: Patient well-developed well-nourished  white male in no acute distress.   Lungs are clear to auscultation , the patient has symmetrical respiratory excursion. Heart has a regular rate and rhythm normal S1 and S2 no murmur.   Abdomen is soft nontender nondistended bowel sounds are present. Extremity examination shows no clubbing cyanosis or edema. Motor examination shows 5 over 5 strength in the lower extremities including the iliopsoas quadriceps dorsiflexor extensor hallicus  longus and plantar flexor bilaterally. Sensation is intact to pinprick in the distal lower extremities. Reflexes are symmetrical bilaterally. No pathologic reflexes  are present. Patient has a normal gait and stance.   Data Review:CBC    Component Value Date/Time   WBC 5.0 01/11/2019 0926   RBC 4.75 01/11/2019 0926   HGB 14.4 01/11/2019 0926   HCT 43.3 01/11/2019 0926   PLT 138 (L) 01/11/2019 0926   MCV 91.2 01/11/2019 0926   MCH 30.3 01/11/2019 0926   MCHC 33.3 01/11/2019 0926   RDW 13.4 01/11/2019 0926   LYMPHSABS 1.4 06/23/2010 0800   MONOABS 0.4 06/23/2010 0800   EOSABS 0.2 06/23/2010 0800   BASOSABS 0.0 06/23/2010 0800                          BMET    Component Value Date/Time   NA 139 01/11/2019 0926   K 4.0 01/11/2019 0926   CL 103 01/11/2019 0926   CO2 25 01/11/2019 0926   GLUCOSE 152 (H) 01/11/2019 0926   BUN 13 01/11/2019 0926   CREATININE 0.78 01/11/2019 0926   CALCIUM 9.2 01/11/2019 0926   GFRNONAA >60 01/11/2019 0926   GFRAA >60 01/11/2019 0926     Assessment/Plan: Patient with multilevel, multifactorial lumbar stenosis as well as a degenerative spondylolisthesis with disabling neurogenic claudication who is admitted now for lumbar decompression and stabilization.  I've discussed with the patient the nature of his condition, the nature the surgical procedure, the typical length of surgery, hospital stay, and overall recuperation, the limitations postoperatively, and risks of surgery. I discussed risks including risks of infection,  bleeding, possibly need for transfusion, the risk of nerve root dysfunction with pain, weakness, numbness, or paresthesias, the risk of dural tear and CSF leakage and possible need for further surgery, the risk of failure of the arthrodesis and possibly for further surgery, the risk of anesthetic complications including myocardial infarction, stroke, pneumonia, and death. We discussed the need for postoperative immobilization in a lumbar brace. Understanding all this the patient does wish to proceed with surgery and is admitted for such.   Hosie Spangle, MD 01/18/2019 7:30 AM

## 2019-01-18 NOTE — Progress Notes (Signed)
Vitals:   01/18/19 1657 01/18/19 1712 01/18/19 1727 01/18/19 1800  BP: (!) 103/47 (!) 99/53 (!) 98/46 (!) 115/50  Pulse: 63 71 66 72  Resp: 11 17 20 16   Temp:    98 F (36.7 C)  TempSrc:    Oral  SpO2: 98% 97% 93% 93%  Weight:      Height:        Patient resting in bed, has not yet ambulated in the halls.  Mild discomfort, given 1 hydrocodone tablet.  Dressing clean and dry.  Foley to straight drainage.  Patient notes that his lower extremities already feel better there is less discomfort and stiffness through the lower extremities, and less burning in the soles of his feet.  Plan: We will begin to ambulate later this evening in the halls, and continue to progress.  Hosie Spangle, MD 01/18/2019, 7:13 PM

## 2019-01-19 LAB — GLUCOSE, CAPILLARY
Glucose-Capillary: 154 mg/dL — ABNORMAL HIGH (ref 70–99)
Glucose-Capillary: 181 mg/dL — ABNORMAL HIGH (ref 70–99)
Glucose-Capillary: 153 mg/dL — ABNORMAL HIGH (ref 70–99)
Glucose-Capillary: 194 mg/dL — ABNORMAL HIGH (ref 70–99)
Glucose-Capillary: 200 mg/dL — ABNORMAL HIGH (ref 70–99)

## 2019-01-19 MED ORDER — MAGNESIUM CITRATE PO SOLN: 1.0000 | Freq: Once | ORAL | Status: DC | PRN

## 2019-01-19 MED ORDER — MAGNESIUM CITRATE PO SOLN: 1.0000 | Freq: Once | ORAL | Status: DC

## 2019-01-19 MED FILL — Thrombin For Soln 20000 Unit: CUTANEOUS | Qty: 1 | Status: AC

## 2019-01-19 MED FILL — Gelatin Absorbable Sponge Size 100: CUTANEOUS | Qty: 1 | Status: AC

## 2019-01-19 NOTE — Progress Notes (Signed)
Vitals:   01/18/19 1943 01/18/19 2322 01/19/19 0400 01/19/19 0721  BP: (!) 137/53 (!) 100/50 124/81 (!) 88/47  Pulse: 68 99 92 92  Resp: 18 20 18 17   Temp: 98.5 F (36.9 C) 98.4 F (36.9 C) 99 F (37.2 C) 98.2 F (36.8 C)  TempSrc: Oral Oral Oral Oral  SpO2: 94% 96% 93% (!) 89%  Weight:      Height:        Patient up and ambulating in the halls with a rolling walker.  Comfortable, using limited amount of hydrocodone.  Dressing clean and dry.  Foley DC'd 0600, no void yet, nursing staff monitoring voiding function.  Overall significant improvement in preoperative neurogenic claudication symptoms.  Plan: Encouraged to ambulate.  Continue to progress through postoperative recovery.  Hosie Spangle, MD 01/19/2019, 9:50 AM

## 2019-01-19 NOTE — Evaluation (Signed)
Physical Therapy Evaluation Patient Details Name: Christopher Randolph MRN: [id number] DOB: 1946/04/26 Today's Date: 01/19/2019   History of Present Illness  Pt is a 73 y/o male who presents s/p L3-S1 PLIF on 01/18/2019. PMH significant for PAD, Lyme Disease, hypothyroidism, HTN, DM, CAD, CA, TKR, cervical surgery.  Clinical Impression  Pt admitted with above diagnosis. Pt currently with functional limitations due to the deficits listed below (see PT Problem List). At the time of PT eval pt was able to perform transfers and ambulation with gross min guard assist to occasional min assist for balance support, walker management, and general safety. Pt's daughter present and voices concern regarding pt's current functional limitations. Pt and daughter report that pt's wife is unable to provide physical assistance for him upon return home, and daughter will be leaving town soon to return to her home. If pt were to stay another night in the hospital, feel another PT session would be beneficial to improve safety on stairs. Acutely, pt will benefit from skilled PT to increase their independence and safety with mobility to allow discharge to the venue listed below.       Follow Up Recommendations No PT follow up;Supervision for mobility/OOB    Equipment Recommendations  3in1 (PT)    Recommendations for Other Services       Precautions / Restrictions Precautions Precautions: Fall;Back Precaution Booklet Issued: Yes (comment) Precaution Comments: Reviewed handout with pt and daughter Required Braces or Orthoses: Spinal Brace Spinal Brace: Lumbar corset;Applied in standing position Restrictions Weight Bearing Restrictions: No      Mobility  Bed Mobility Overal bed mobility: Needs Assistance Bed Mobility: Rolling;Sidelying to Sit Rolling: Supervision Sidelying to sit: Min guard       General bed mobility comments: Close guard for safety as pt transitioned to EOB. VC's for log roll technique. HOB  flat and rails lowered to simulate home environment.   Transfers Overall transfer level: Needs assistance Equipment used: Rolling walker (2 wheeled) Transfers: Sit to/from Stand Sit to Stand: Min guard         General transfer comment: Hands-on support provided for pt to power up to full stand. VC's for hand placement on seated surface for safety.   Ambulation/Gait Ambulation/Gait assistance: Min assist Gait Distance (Feet): 75 Feet Assistive device: Rolling walker (2 wheeled) Gait Pattern/deviations: Step-through pattern;Decreased stride length;Trunk flexed Gait velocity: Decreased Gait velocity interpretation: 1.31 - 2.62 ft/sec, indicative of limited community ambulator General Gait Details: VC's for improved posture, closer walker proximity, and forward gaze. Pt taking several standing rest breaks due to pain. Occasional assist provided for walker management and safety.   Stairs Stairs: Yes Stairs assistance: Min assist Stair Management: No rails;Step to pattern;Backwards;With walker Number of Stairs: 3 General stair comments: VC's for sequencing and general safety. Pt and daughter were present for education and they were instructed in backwards negotiation of stairs with RW for support as they do not have railings at home.   Wheelchair Mobility    Modified Rankin (Stroke Patients Only)       Balance Overall balance assessment: Needs assistance Sitting-balance support: Feet supported;No upper extremity supported Sitting balance-Leahy Scale: Fair     Standing balance support: Bilateral upper extremity supported;During functional activity Standing balance-Leahy Scale: Poor Standing balance comment: Reliant on UE support on RW for balance.                              Pertinent Vitals/Pain Pain  Assessment: Faces Faces Pain Scale: Hurts even more Pain Location: Back, Hips Pain Descriptors / Indicators: Operative site  guarding;Discomfort;Grimacing;Aching Pain Intervention(s): Limited activity within patient's tolerance;Monitored during session;Repositioned    Home Living Family/patient expects to be discharged to:: Private residence Living Arrangements: Spouse/significant other Available Help at Discharge: Family;Available 24 hours/day(Not able to provide physical assistance) Type of Home: House Home Access: Stairs to enter Entrance Stairs-Rails: None Entrance Stairs-Number of Steps: 2 Home Layout: One level Home Equipment: Environmental consultant - 2 wheels      Prior Function Level of Independence: Independent               Hand Dominance        Extremity/Trunk Assessment   Upper Extremity Assessment Upper Extremity Assessment: Overall WFL for tasks assessed    Lower Extremity Assessment Lower Extremity Assessment: Generalized weakness(Consistent with pre-op diagnosis)    Cervical / Trunk Assessment Cervical / Trunk Assessment: Other exceptions Cervical / Trunk Exceptions: s/p surgery  Communication   Communication: No difficulties  Cognition Arousal/Alertness: Awake/alert Behavior During Therapy: WFL for tasks assessed/performed Overall Cognitive Status: Within Functional Limits for tasks assessed                                        General Comments      Exercises     Assessment/Plan    PT Assessment Patient needs continued PT services  PT Problem List Decreased strength;Decreased activity tolerance;Decreased balance;Decreased mobility;Decreased knowledge of use of DME;Decreased safety awareness;Decreased knowledge of precautions;Pain       PT Treatment Interventions DME instruction;Gait training;Stair training;Therapeutic activities;Functional mobility training;Therapeutic exercise;Neuromuscular re-education;Patient/family education    PT Goals (Current goals can be found in the Care Plan section)  Acute Rehab PT Goals Patient Stated Goal: Home tomorrow - pt  states he does not feel comfortable going home today PT Goal Formulation: With patient/family Time For Goal Achievement: 01/26/19 Potential to Achieve Goals: Good    Frequency Min 5X/week   Barriers to discharge        Co-evaluation               AM-PAC PT "6 Clicks" Mobility  Outcome Measure Help needed turning from your back to your side while in a flat bed without using bedrails?: None Help needed moving from lying on your back to sitting on the side of a flat bed without using bedrails?: A Little Help needed moving to and from a bed to a chair (including a wheelchair)?: A Little Help needed standing up from a chair using your arms (e.g., wheelchair or bedside chair)?: A Little Help needed to walk in hospital room?: A Little Help needed climbing 3-5 steps with a railing? : A Little 6 Click Score: 19    End of Session Equipment Utilized During Treatment: Gait belt;Back brace Activity Tolerance: Patient limited by fatigue;Patient limited by pain Patient left: in chair;with call bell/phone within reach;with family/visitor present Nurse Communication: Mobility status PT Visit Diagnosis: Unsteadiness on feet (R26.81);Pain;Other symptoms and signs involving the nervous system (R29.898) Pain - part of body: (back)    Time: 1101-1135 PT Time Calculation (min) (ACUTE ONLY): 34 min   Charges:   PT Evaluation $PT Eval Moderate Complexity: 1 Mod PT Treatments $Gait Training: 8-22 mins        Rolinda Roan, PT, DPT Acute Rehabilitation Services Pager: [phone] Office: [phone]   Thelma Comp 01/19/2019, 1:02 PM

## 2019-01-20 LAB — GLUCOSE, CAPILLARY
Glucose-Capillary: 200 mg/dL — ABNORMAL HIGH (ref 70–99)
Glucose-Capillary: 150 mg/dL — ABNORMAL HIGH (ref 70–99)

## 2019-01-20 MED ORDER — CYCLOBENZAPRINE HCL 10 MG PO TABS: 5.0000 mg | ORAL_TABLET | Freq: Three times a day (TID) | ORAL | 0 refills | Status: DC | PRN

## 2019-01-20 MED ORDER — CYCLOBENZAPRINE HCL 10 MG PO TABS: 10.0000 mg | ORAL_TABLET | Freq: Three times a day (TID) | ORAL | Status: DC | PRN

## 2019-01-20 MED ORDER — HYDROCODONE-ACETAMINOPHEN 5-325 MG PO TABS: 1.0000 | ORAL_TABLET | ORAL | 0 refills | Status: DC | PRN

## 2019-01-20 NOTE — Progress Notes (Signed)
Physical Therapy Treatment and Discharge Patient Details Name: Christopher Randolph MRN: [id number] DOB: 08-09-1946 Today's Date: 01/20/2019    History of Present Illness Pt is a 73 y/o male who presents s/p L3-S1 PLIF on 01/18/2019. PMH significant for PAD, Lyme Disease, hypothyroidism, HTN, DM, CAD, CA, TKR, cervical surgery.    PT Comments    Pt progressing well with post-op mobility. Pt educated on precautions, brace application/wearing schedule, stair negotiation, car transfer, and safe activity progression. Pt reports feeling comfortable returning home today and with no additional questions. Pt has met acute PT goals and we will sign off at this time. If needs change, please reconsult.    Follow Up Recommendations  No PT follow up;Supervision for mobility/OOB     Equipment Recommendations  3in1 (PT)    Recommendations for Other Services       Precautions / Restrictions Precautions Precautions: Fall;Back Precaution Booklet Issued: Yes (comment) Precaution Comments: Reviewed handout with pt and daughter Required Braces or Orthoses: Spinal Brace Spinal Brace: Lumbar corset;Applied in standing position Restrictions Weight Bearing Restrictions: No    Mobility  Bed Mobility               General bed mobility comments: Pt sitting up EOB whren PT arrived. Verbally reviewed log roll technique.   Transfers Overall transfer level: Modified independent Equipment used: Rolling walker (2 wheeled) Transfers: Sit to/from Stand           General transfer comment: VC's for hand placement on seated surface for safety. No assist required however increased time needed to achieve full stand.   Ambulation/Gait Ambulation/Gait assistance: Modified independent (Device/Increase time) Gait Distance (Feet): 250 Feet Assistive device: Rolling walker (2 wheeled) Gait Pattern/deviations: Step-through pattern;Decreased stride length;Trunk flexed Gait velocity: Decreased Gait velocity  interpretation: 1.31 - 2.62 ft/sec, indicative of limited community ambulator General Gait Details: Slow but generally steady with RW for support. Pt reports gait speed was slow prior to surgery. Pt was cued for improved posture and was able to maintain corrective changes throughout.   Stairs Stairs: Yes Stairs assistance: Supervision Stair Management: No rails;Step to pattern;Backwards;With walker Number of Stairs: 2 General stair comments: Reviewed sequencing for backwards negotiation with RW. Pt was able to demonstrate well with supervision for safety and therapist anchoring walker.    Wheelchair Mobility    Modified Rankin (Stroke Patients Only)       Balance Overall balance assessment: Needs assistance Sitting-balance support: Feet supported;No upper extremity supported Sitting balance-Leahy Scale: Fair     Standing balance support: Bilateral upper extremity supported;During functional activity Standing balance-Leahy Scale: Poor Standing balance comment: Reliant on UE support on RW for balance.                             Cognition Arousal/Alertness: Awake/alert Behavior During Therapy: WFL for tasks assessed/performed Overall Cognitive Status: Within Functional Limits for tasks assessed                                        Exercises      General Comments        Pertinent Vitals/Pain Pain Assessment: Faces Faces Pain Scale: Hurts little more Pain Location: Back, Hips Pain Descriptors / Indicators: Operative site guarding;Discomfort;Grimacing;Aching Pain Intervention(s): Monitored during session    Home Living Family/patient expects to be discharged to:: Private residence Living Arrangements: Spouse/significant  other Available Help at Discharge: Family;Available 24 hours/day(Not able to provide physical assistance) Type of Home: House Home Access: Stairs to enter Entrance Stairs-Rails: None Home Layout: One level Home  Equipment: Environmental consultant - 2 wheels      Prior Function Level of Independence: Independent          PT Goals (current goals can now be found in the care plan section) Acute Rehab PT Goals Patient Stated Goal: Home this morning PT Goal Formulation: With patient/family Time For Goal Achievement: 01/26/19 Potential to Achieve Goals: Good Progress towards PT goals: Progressing toward goals    Frequency    Min 5X/week      PT Plan Current plan remains appropriate    Co-evaluation              AM-PAC PT "6 Clicks" Mobility   Outcome Measure  Help needed turning from your back to your side while in a flat bed without using bedrails?: None Help needed moving from lying on your back to sitting on the side of a flat bed without using bedrails?: None Help needed moving to and from a bed to a chair (including a wheelchair)?: None Help needed standing up from a chair using your arms (e.g., wheelchair or bedside chair)?: None Help needed to walk in hospital room?: None Help needed climbing 3-5 steps with a railing? : A Little 6 Click Score: 23    End of Session Equipment Utilized During Treatment: Gait belt;Back brace Activity Tolerance: Patient limited by fatigue;Patient limited by pain Patient left: in chair;with call bell/phone within reach;with family/visitor present Nurse Communication: Mobility status PT Visit Diagnosis: Unsteadiness on feet (R26.81);Pain;Other symptoms and signs involving the nervous system (R29.898) Pain - part of body: (back)     Time: 6681-5947 PT Time Calculation (min) (ACUTE ONLY): 22 min  Charges:  $Gait Training: 8-22 mins                     Rolinda Roan, PT, DPT Acute Rehabilitation Services Pager: [phone] Office: [phone]    Thelma Comp 01/20/2019, 8:22 AM

## 2019-01-20 NOTE — Discharge Summary (Signed)
Physician Discharge Summary  Patient ID: Christopher Randolph MRN: [id number] DOB/AGE: 09-10-1946 73 y.o.  Admit date: 01/18/2019 Discharge date: 01/20/2019  Admission Diagnoses:  Multilevel, multifactorial lumbar stenosis with neurogenic claudication; degenerative lumbar spondylolisthesis; lumbar spondylosis; lumbar degenerative disc disease  Discharge Diagnoses:  Multilevel, multifactorial lumbar stenosis with neurogenic claudication; degenerative lumbar spondylolisthesis; lumbar spondylosis; lumbar degenerative disc disease Active Problems:   Lumbar stenosis with neurogenic claudication   Discharged Condition: good  Hospital Course: Patient was admitted, underwent an L3-S1 decompression and arthrodesis.  He has done well following surgery.  He is up and ambulating actively in the halls.  We did have him work with the physical therapy staff, and arrange for recommended DME.  His dressing was removed, and his incision is healing nicely; there is no erythema, swelling, ecchymosis, or drainage.  He is being discharged home with instructions regarding wound care and activities.  He is scheduled to follow-up with me in the office in 3 weeks with x-rays.  Consults: PT  Discharge Exam: Blood pressure (!) 105/58, pulse 92, temperature 97.8 F (36.6 C), temperature source Oral, resp. rate 18, height 5\' 8"  (1.727 m), weight 83.8 kg, SpO2 94 %.  Disposition: Discharge disposition: 01-Home or Self Care       Discharge Instructions    Discharge wound care:   Complete by:  As directed    Leave the wound open to air. Shower daily with the wound uncovered. Water and soapy water should run over the incision area. Do not wash directly on the incision for 2 weeks. Remove the glue after 2 weeks.   Driving Restrictions   Complete by:  As directed    No driving for 2 weeks. May ride in the car locally now. May begin to drive locally in 2 weeks.   Other Restrictions   Complete by:  As directed    Walk  gradually increasing distances out in the fresh air at least twice a day. Walking additional 6 times inside the house, gradually increasing distances, daily. No bending, lifting, or twisting. Perform activities between shoulder and waist height (that is at counter height when standing or table height when sitting).     Allergies as of 01/20/2019      Reactions   Azithromycin Swelling   Shrimp [shellfish Allergy] Other (See Comments)   Gout flares      Medication List    STOP taking these medications   brimonidine-timolol 0.2-0.5 % ophthalmic solution Commonly known as:  COMBIGAN     TAKE these medications   aspirin EC 81 MG tablet Take 81 mg by mouth daily.   atorvastatin 20 MG tablet Commonly known as:  LIPITOR TAKE ONE TABLET BY MOUTH DAILY   brimonidine 0.2 % ophthalmic solution Commonly known as:  ALPHAGAN Place 1 drop into the left eye 2 (two) times daily.   cyclobenzaprine 10 MG tablet Commonly known as:  FLEXERIL Take 0.5-1 tablets (5-10 mg total) by mouth 3 (three) times daily as needed for muscle spasms.   dorzolamide-timolol 22.3-6.8 MG/ML ophthalmic solution Commonly known as:  COSOPT Place 1 drop into the left eye 2 (two) times daily.   FLONASE ALLERGY RELIEF 50 MCG/ACT nasal spray Generic drug:  fluticasone Place 2 sprays into both nostrils daily.   HYDROcodone-acetaminophen 5-325 MG tablet Commonly known as:  NORCO/VICODIN Take 1-2 tablets by mouth every 4 (four) hours as needed (pain).   levothyroxine 175 MCG tablet Commonly known as:  SYNTHROID, LEVOTHROID Take 175 mcg by mouth every other  day.   levothyroxine 150 MCG tablet Commonly known as:  SYNTHROID, LEVOTHROID Take 150 mcg by mouth every other day.   Loteprednol Etabonate 1 % Susp Commonly known as:  INVELTYS Place 1 drop into the left eye 2 (two) times daily.   metFORMIN 500 MG tablet Commonly known as:  GLUCOPHAGE Take 500 mg by mouth daily.   methocarbamol 500 MG tablet Commonly  known as:  ROBAXIN Take 500 mg by mouth at bedtime as needed (muscle spasms).   metoprolol tartrate 25 MG tablet Commonly known as:  LOPRESSOR Take 25 mg by mouth 2 (two) times daily.   mometasone 0.1 % ointment Commonly known as:  ELOCON Apply 1 application topically 2 (two) times daily as needed (for psorasis).   nabumetone 500 MG tablet Commonly known as:  RELAFEN Take 500 mg by mouth 2 (two) times daily as needed (for inflammation).   naproxen sodium 220 MG tablet Commonly known as:  ALEVE Take 440 mg by mouth daily as needed (for pain.).   nitroGLYCERIN 0.4 MG SL tablet Commonly known as:  NITROSTAT Place 1 tablet (0.4 mg total) under the tongue every 5 (five) minutes as needed for chest pain.   telmisartan-hydrochlorothiazide 80-25 MG tablet Commonly known as:  MICARDIS HCT Take 1 tablet by mouth daily.   traMADol 50 MG tablet Commonly known as:  ULTRAM Take 50 mg by mouth every 6 (six) hours as needed. for pain            Durable Medical Equipment  (From admission, onward)         Start     Ordered   01/19/19 1720  For home use only DME 3 n 1  Once     01/19/19 1719           Discharge Care Instructions  (From admission, onward)         Start     Ordered   01/20/19 0000  Discharge wound care:    Comments:  Leave the wound open to air. Shower daily with the wound uncovered. Water and soapy water should run over the incision area. Do not wash directly on the incision for 2 weeks. Remove the glue after 2 weeks.   01/20/19 0829           Signed: Hosie Spangle 01/20/2019, 8:30 AM

## 2019-01-20 NOTE — Discharge Instructions (Signed)
°  Call Your Doctor If Any of These Occur °Redness, drainage, or swelling at the wound.  °Temperature greater than 101 degrees. °Severe pain not relieved by pain medication. °Incision starts to come apart. °Follow Up Appt °Call today for appointment in 3 weeks [phone]) or for problems.  If you have any hardware placed in your spine, you will need an x-ray before your appointment. °

## 2019-01-20 NOTE — Progress Notes (Signed)
Patient alert and oriented, mae's well, voiding adequate amount of urine, swallowing without difficulty, no c/o pain at time of discharge. Patient discharged home with family. Script and discharged instructions given to patient. Patient and family stated understanding of instructions given. Patient has an appointment with Dr. Nudelman 

## 2019-01-24 ENCOUNTER — Encounter (HOSPITAL_COMMUNITY)
Admission: EM | Disposition: A | Discharge status: Skilled Nursing Facility | Payer: Self-pay | Source: Home / Self Care | Attending: Neurosurgery

## 2019-01-24 ENCOUNTER — Inpatient Hospital Stay (HOSPITAL_COMMUNITY)
Admission: EM | Admit: 2019-01-24 | Discharge: 2019-01-27 | DRG: 920 | Disposition: A | Discharge status: Skilled Nursing Facility | Payer: Medicare Other | Attending: Neurosurgery | Admitting: Neurosurgery

## 2019-01-24 ENCOUNTER — Emergency Department (HOSPITAL_COMMUNITY): Payer: Medicare Other

## 2019-01-24 ENCOUNTER — Emergency Department (HOSPITAL_COMMUNITY): Payer: Medicare Other | Admitting: Anesthesiology

## 2019-01-24 ENCOUNTER — Encounter (HOSPITAL_COMMUNITY): Payer: Self-pay | Admitting: Orthopedic Surgery

## 2019-01-24 DIAGNOSIS — M96842 Postprocedural seroma of a musculoskeletal structure following a musculoskeletal system procedure: Secondary | ICD-10-CM | POA: Diagnosis not present

## 2019-01-24 DIAGNOSIS — N390 Urinary tract infection, site not specified: Secondary | ICD-10-CM | POA: Diagnosis not present

## 2019-01-24 DIAGNOSIS — M2550 Pain in unspecified joint: Secondary | ICD-10-CM | POA: Diagnosis not present

## 2019-01-24 DIAGNOSIS — E785 Hyperlipidemia, unspecified: Secondary | ICD-10-CM | POA: Diagnosis present

## 2019-01-24 DIAGNOSIS — G9763 Postprocedural seroma of a nervous system organ or structure following a nervous system procedure: Secondary | ICD-10-CM

## 2019-01-24 DIAGNOSIS — I10 Essential (primary) hypertension: Secondary | ICD-10-CM | POA: Diagnosis present

## 2019-01-24 DIAGNOSIS — E039 Hypothyroidism, unspecified: Secondary | ICD-10-CM | POA: Diagnosis present

## 2019-01-24 DIAGNOSIS — R339 Retention of urine, unspecified: Secondary | ICD-10-CM | POA: Diagnosis not present

## 2019-01-24 DIAGNOSIS — Z85820 Personal history of malignant melanoma of skin: Secondary | ICD-10-CM | POA: Diagnosis not present

## 2019-01-24 DIAGNOSIS — Z7401 Bed confinement status: Secondary | ICD-10-CM | POA: Diagnosis not present

## 2019-01-24 DIAGNOSIS — Z888 Allergy status to other drugs, medicaments and biological substances status: Secondary | ICD-10-CM | POA: Diagnosis not present

## 2019-01-24 DIAGNOSIS — Z8249 Family history of ischemic heart disease and other diseases of the circulatory system: Secondary | ICD-10-CM | POA: Diagnosis not present

## 2019-01-24 DIAGNOSIS — E1151 Type 2 diabetes mellitus with diabetic peripheral angiopathy without gangrene: Secondary | ICD-10-CM | POA: Diagnosis present

## 2019-01-24 DIAGNOSIS — K5900 Constipation, unspecified: Secondary | ICD-10-CM | POA: Diagnosis not present

## 2019-01-24 DIAGNOSIS — M4325 Fusion of spine, thoracolumbar region: Secondary | ICD-10-CM | POA: Diagnosis not present

## 2019-01-24 DIAGNOSIS — G834 Cauda equina syndrome: Secondary | ICD-10-CM | POA: Diagnosis not present

## 2019-01-24 DIAGNOSIS — M5136 Other intervertebral disc degeneration, lumbar region: Secondary | ICD-10-CM | POA: Diagnosis not present

## 2019-01-24 DIAGNOSIS — Z87891 Personal history of nicotine dependence: Secondary | ICD-10-CM

## 2019-01-24 DIAGNOSIS — M6281 Muscle weakness (generalized): Secondary | ICD-10-CM | POA: Diagnosis not present

## 2019-01-24 DIAGNOSIS — M96840 Postprocedural hematoma of a musculoskeletal structure following a musculoskeletal system procedure: Principal | ICD-10-CM | POA: Diagnosis present

## 2019-01-24 DIAGNOSIS — M9973 Connective tissue and disc stenosis of intervertebral foramina of lumbar region: Secondary | ICD-10-CM | POA: Diagnosis not present

## 2019-01-24 DIAGNOSIS — N319 Neuromuscular dysfunction of bladder, unspecified: Secondary | ICD-10-CM | POA: Diagnosis not present

## 2019-01-24 DIAGNOSIS — R2689 Other abnormalities of gait and mobility: Secondary | ICD-10-CM | POA: Diagnosis not present

## 2019-01-24 DIAGNOSIS — I2510 Atherosclerotic heart disease of native coronary artery without angina pectoris: Secondary | ICD-10-CM | POA: Diagnosis not present

## 2019-01-24 DIAGNOSIS — N401 Enlarged prostate with lower urinary tract symptoms: Secondary | ICD-10-CM | POA: Diagnosis not present

## 2019-01-24 DIAGNOSIS — R5381 Other malaise: Secondary | ICD-10-CM | POA: Diagnosis not present

## 2019-01-24 DIAGNOSIS — M48062 Spinal stenosis, lumbar region with neurogenic claudication: Secondary | ICD-10-CM | POA: Diagnosis not present

## 2019-01-24 DIAGNOSIS — B9620 Unspecified Escherichia coli [E. coli] as the cause of diseases classified elsewhere: Secondary | ICD-10-CM | POA: Diagnosis present

## 2019-01-24 DIAGNOSIS — Z4789 Encounter for other orthopedic aftercare: Secondary | ICD-10-CM | POA: Diagnosis not present

## 2019-01-24 DIAGNOSIS — M48061 Spinal stenosis, lumbar region without neurogenic claudication: Secondary | ICD-10-CM | POA: Diagnosis not present

## 2019-01-24 DIAGNOSIS — Z91013 Allergy to seafood: Secondary | ICD-10-CM | POA: Diagnosis not present

## 2019-01-24 HISTORY — PX: LUMBAR WOUND DEBRIDEMENT: SHX1988

## 2019-01-24 LAB — BASIC METABOLIC PANEL
Anion gap: 11 (ref 5–15)
BUN: 22 mg/dL (ref 8–23)
CALCIUM: 8.8 mg/dL — AB (ref 8.9–10.3)
CO2: 26 mmol/L (ref 22–32)
CREATININE: 0.94 mg/dL (ref 0.61–1.24)
Chloride: 99 mmol/L (ref 98–111)
GFR calc Af Amer: >60 mL/min (ref >60)
GFR calc non Af Amer: >60 mL/min (ref >60)
Glucose, Bld: 165 mg/dL — ABNORMAL HIGH (ref 70–99)
Potassium: 4 mmol/L (ref 3.5–5.1)
Sodium: 136 mmol/L (ref 135–145)

## 2019-01-24 LAB — URINALYSIS, ROUTINE W REFLEX MICROSCOPIC
BILIRUBIN URINE: NEGATIVE
Glucose, UA: NEGATIVE mg/dL
KETONES UR: NEGATIVE mg/dL
NITRITE: NEGATIVE
Protein, ur: NEGATIVE mg/dL
Specific Gravity, Urine: 1.018 (ref 1.005–1.030)
pH: 5 (ref 5.0–8.0)

## 2019-01-24 LAB — GLUCOSE, CAPILLARY
Glucose-Capillary: 114 mg/dL — ABNORMAL HIGH (ref 70–99)
Glucose-Capillary: 157 mg/dL — ABNORMAL HIGH (ref 70–99)
Glucose-Capillary: 305 mg/dL — ABNORMAL HIGH (ref 70–99)

## 2019-01-24 LAB — CBC
HCT: 30.8 % — ABNORMAL LOW (ref 39.0–52.0)
Hemoglobin: 9.8 g/dL — ABNORMAL LOW (ref 13.0–17.0)
MCH: 29.7 pg (ref 26.0–34.0)
MCHC: 31.8 g/dL (ref 30.0–36.0)
MCV: 93.3 fL (ref 80.0–100.0)
Platelets: 151 10*3/uL (ref 150–400)
RBC: 3.3 MIL/uL — ABNORMAL LOW (ref 4.22–5.81)
RDW: 13.7 % (ref 11.5–15.5)
WBC: 8 10*3/uL (ref 4.0–10.5)
nRBC: 0.3 % — ABNORMAL HIGH (ref 0.0–0.2)

## 2019-01-24 LAB — TYPE AND SCREEN
ABO/RH(D): O POS
Antibody Screen: NEGATIVE

## 2019-01-24 SURGERY — LUMBAR WOUND DEBRIDEMENT
Anesthesia: General

## 2019-01-24 MED ORDER — LIDOCAINE 2% (20 MG/ML) 5 ML SYRINGE
INTRAMUSCULAR | Status: AC
  Filled 2019-01-24: qty 5

## 2019-01-24 MED ORDER — DEXAMETHASONE SODIUM PHOSPHATE 10 MG/ML IJ SOLN
INTRAMUSCULAR | Status: DC | PRN
  Administered 2019-01-24: 10 mg via INTRAVENOUS

## 2019-01-24 MED ORDER — INSULIN ASPART 100 UNIT/ML ~~LOC~~ SOLN
0.0000 [IU] | Freq: Three times a day (TID) | SUBCUTANEOUS | Status: DC
  Administered 2019-01-25 (×2): 3 [IU] via SUBCUTANEOUS
  Administered 2019-01-25: 2 [IU] via SUBCUTANEOUS

## 2019-01-24 MED ORDER — PROPOFOL 10 MG/ML IV BOLUS
INTRAVENOUS | Status: AC
  Filled 2019-01-24: qty 20

## 2019-01-24 MED ORDER — BUPIVACAINE HCL (PF) 0.25 % IJ SOLN
INTRAMUSCULAR | Status: AC
  Filled 2019-01-24: qty 30

## 2019-01-24 MED ORDER — CIPROFLOXACIN HCL 500 MG PO TABS
500.0000 mg | ORAL_TABLET | Freq: Two times a day (BID) | ORAL | Status: DC
  Administered 2019-01-24 – 2019-01-27 (×6): 500 mg via ORAL
  Filled 2019-01-24 (×8): qty 1

## 2019-01-24 MED ORDER — SODIUM CHLORIDE 0.9 % IV SOLN
INTRAVENOUS | Status: DC | PRN
  Administered 2019-01-24: 14:00:00

## 2019-01-24 MED ORDER — POTASSIUM CHLORIDE IN NACL 20-0.9 MEQ/L-% IV SOLN: INTRAVENOUS | Status: DC

## 2019-01-24 MED ORDER — KETOROLAC TROMETHAMINE 30 MG/ML IJ SOLN
15.0000 mg | Freq: Once | INTRAMUSCULAR | Status: AC
  Administered 2019-01-24: 15 mg via INTRAVENOUS

## 2019-01-24 MED ORDER — ACETAMINOPHEN 650 MG RE SUPP: 650.0000 mg | RECTAL | Status: DC | PRN

## 2019-01-24 MED ORDER — HYDROCODONE-ACETAMINOPHEN 5-325 MG PO TABS
1.0000 | ORAL_TABLET | ORAL | Status: DC | PRN
  Administered 2019-01-24: 1 via ORAL
  Administered 2019-01-25 – 2019-01-26 (×7): 2 via ORAL
  Administered 2019-01-26: 1 via ORAL
  Administered 2019-01-27 (×3): 2 via ORAL
  Filled 2019-01-24: qty 2
  Filled 2019-01-24: qty 1
  Filled 2019-01-24 (×2): qty 2
  Filled 2019-01-24 (×2): qty 1
  Filled 2019-01-24 (×7): qty 2

## 2019-01-24 MED ORDER — MORPHINE SULFATE (PF) 4 MG/ML IV SOLN: 4.0000 mg | INTRAVENOUS | Status: DC | PRN

## 2019-01-24 MED ORDER — IRBESARTAN 300 MG PO TABS
300.0000 mg | ORAL_TABLET | Freq: Every day | ORAL | Status: DC
  Administered 2019-01-25 – 2019-01-27 (×3): 300 mg via ORAL
  Filled 2019-01-24 (×4): qty 1

## 2019-01-24 MED ORDER — PROMETHAZINE HCL 25 MG/ML IJ SOLN: 6.2500 mg | INTRAMUSCULAR | Status: DC | PRN

## 2019-01-24 MED ORDER — ACETAMINOPHEN 10 MG/ML IV SOLN
INTRAVENOUS | Status: AC
  Filled 2019-01-24: qty 100

## 2019-01-24 MED ORDER — DEXAMETHASONE SODIUM PHOSPHATE 10 MG/ML IJ SOLN
INTRAMUSCULAR | Status: AC
  Filled 2019-01-24: qty 1

## 2019-01-24 MED ORDER — LIDOCAINE-EPINEPHRINE 1 %-1:100000 IJ SOLN
INTRAMUSCULAR | Status: AC
  Filled 2019-01-24: qty 1

## 2019-01-24 MED ORDER — SODIUM CHLORIDE 0.9 % IV SOLN: 250.0000 mL | INTRAVENOUS | Status: DC

## 2019-01-24 MED ORDER — NITROGLYCERIN 0.4 MG SL SUBL: 0.4000 mg | SUBLINGUAL_TABLET | SUBLINGUAL | Status: DC | PRN

## 2019-01-24 MED ORDER — TAMSULOSIN HCL 0.4 MG PO CAPS
0.4000 mg | ORAL_CAPSULE | Freq: Every day | ORAL | Status: DC
  Administered 2019-01-25 – 2019-01-27 (×3): 0.4 mg via ORAL
  Filled 2019-01-24 (×3): qty 1

## 2019-01-24 MED ORDER — ACETAMINOPHEN 10 MG/ML IV SOLN: 1000.0000 mg | Freq: Once | INTRAVENOUS | Status: DC | PRN

## 2019-01-24 MED ORDER — 0.9 % SODIUM CHLORIDE (POUR BTL) OPTIME
TOPICAL | Status: DC | PRN
  Administered 2019-01-24: 1000 mL

## 2019-01-24 MED ORDER — PHENYLEPHRINE 40 MCG/ML (10ML) SYRINGE FOR IV PUSH (FOR BLOOD PRESSURE SUPPORT)
PREFILLED_SYRINGE | INTRAVENOUS | Status: DC | PRN
  Administered 2019-01-24: 80 ug via INTRAVENOUS
  Administered 2019-01-24: 40 ug via INTRAVENOUS

## 2019-01-24 MED ORDER — FENTANYL CITRATE (PF) 250 MCG/5ML IJ SOLN
INTRAMUSCULAR | Status: DC | PRN
  Administered 2019-01-24: 50 ug via INTRAVENOUS
  Administered 2019-01-24: 100 ug via INTRAVENOUS
  Administered 2019-01-24: 50 ug via INTRAVENOUS

## 2019-01-24 MED ORDER — ROCURONIUM BROMIDE 10 MG/ML (PF) SYRINGE
PREFILLED_SYRINGE | INTRAVENOUS | Status: DC | PRN
  Administered 2019-01-24: 50 mg via INTRAVENOUS

## 2019-01-24 MED ORDER — CIPROFLOXACIN IN D5W 400 MG/200ML IV SOLN
400.0000 mg | Freq: Two times a day (BID) | INTRAVENOUS | Status: DC
  Administered 2019-01-24: 400 mg via INTRAVENOUS

## 2019-01-24 MED ORDER — LOTEPREDNOL ETABONATE 1 % OP SUSP: 1.0000 [drp] | Freq: Two times a day (BID) | OPHTHALMIC | Status: DC

## 2019-01-24 MED ORDER — PHENOL 1.4 % MT LIQD: 1.0000 | OROMUCOSAL | Status: DC | PRN

## 2019-01-24 MED ORDER — HYDROCHLOROTHIAZIDE 25 MG PO TABS
25.0000 mg | ORAL_TABLET | Freq: Every day | ORAL | Status: DC
  Administered 2019-01-25 – 2019-01-27 (×3): 25 mg via ORAL
  Filled 2019-01-24 (×3): qty 1

## 2019-01-24 MED ORDER — LIDOCAINE 2% (20 MG/ML) 5 ML SYRINGE
INTRAMUSCULAR | Status: DC | PRN
  Administered 2019-01-24: 100 mg via INTRAVENOUS

## 2019-01-24 MED ORDER — VANCOMYCIN HCL 1000 MG IV SOLR
INTRAVENOUS | Status: DC | PRN
  Administered 2019-01-24: 1000 mg via TOPICAL

## 2019-01-24 MED ORDER — BRIMONIDINE TARTRATE 0.2 % OP SOLN
1.0000 [drp] | Freq: Two times a day (BID) | OPHTHALMIC | Status: DC
  Filled 2019-01-24: qty 5

## 2019-01-24 MED ORDER — LEVOTHYROXINE SODIUM 175 MCG PO TABS
175.0000 ug | ORAL_TABLET | ORAL | Status: DC
  Administered 2019-01-26: 175 ug via ORAL
  Filled 2019-01-24: qty 1

## 2019-01-24 MED ORDER — FLUTICASONE PROPIONATE 50 MCG/ACT NA SUSP
2.0000 | Freq: Every day | NASAL | Status: DC
  Administered 2019-01-25 – 2019-01-27 (×3): 2 via NASAL
  Filled 2019-01-24: qty 16

## 2019-01-24 MED ORDER — CEFAZOLIN SODIUM-DEXTROSE 2-4 GM/100ML-% IV SOLN
2.0000 g | Freq: Once | INTRAVENOUS | Status: AC
  Administered 2019-01-24: 2 g via INTRAVENOUS

## 2019-01-24 MED ORDER — ONDANSETRON HCL 4 MG/2ML IJ SOLN
INTRAMUSCULAR | Status: AC
  Filled 2019-01-24: qty 2

## 2019-01-24 MED ORDER — CYCLOBENZAPRINE HCL 5 MG PO TABS
5.0000 mg | ORAL_TABLET | Freq: Three times a day (TID) | ORAL | Status: DC | PRN
  Administered 2019-01-24 – 2019-01-25 (×2): 10 mg via ORAL
  Filled 2019-01-24 (×2): qty 2

## 2019-01-24 MED ORDER — OXYCODONE HCL 5 MG PO TABS
5.0000 mg | ORAL_TABLET | Freq: Once | ORAL | Status: AC
  Administered 2019-01-24: 5 mg via ORAL
  Filled 2019-01-24: qty 1

## 2019-01-24 MED ORDER — MAGNESIUM HYDROXIDE 400 MG/5ML PO SUSP: 30.0000 mL | Freq: Every day | ORAL | Status: DC | PRN

## 2019-01-24 MED ORDER — OXYCODONE HCL 5 MG/5ML PO SOLN: 5.0000 mg | Freq: Once | ORAL | Status: DC | PRN

## 2019-01-24 MED ORDER — THROMBIN 5000 UNITS EX SOLR
CUTANEOUS | Status: AC
  Filled 2019-01-24: qty 10000

## 2019-01-24 MED ORDER — DORZOLAMIDE HCL-TIMOLOL MAL 2-0.5 % OP SOLN
1.0000 [drp] | Freq: Two times a day (BID) | OPHTHALMIC | Status: DC
  Filled 2019-01-24: qty 10

## 2019-01-24 MED ORDER — CEFAZOLIN SODIUM-DEXTROSE 2-4 GM/100ML-% IV SOLN
INTRAVENOUS | Status: AC
  Filled 2019-01-24: qty 100

## 2019-01-24 MED ORDER — OXYCODONE HCL 5 MG PO TABS: 5.0000 mg | ORAL_TABLET | Freq: Once | ORAL | Status: DC | PRN

## 2019-01-24 MED ORDER — SODIUM CHLORIDE 0.9% FLUSH: 3.0000 mL | INTRAVENOUS | Status: DC | PRN

## 2019-01-24 MED ORDER — METFORMIN HCL 500 MG PO TABS
500.0000 mg | ORAL_TABLET | Freq: Every day | ORAL | Status: DC
  Administered 2019-01-25 – 2019-01-27 (×3): 500 mg via ORAL
  Filled 2019-01-24 (×3): qty 1

## 2019-01-24 MED ORDER — FLEET ENEMA 7-19 GM/118ML RE ENEM: 1.0000 | ENEMA | Freq: Once | RECTAL | Status: DC | PRN

## 2019-01-24 MED ORDER — FENTANYL CITRATE (PF) 100 MCG/2ML IJ SOLN
INTRAMUSCULAR | Status: AC
  Filled 2019-01-24: qty 2

## 2019-01-24 MED ORDER — MENTHOL 3 MG MT LOZG
1.0000 | LOZENGE | OROMUCOSAL | Status: DC | PRN
  Filled 2019-01-24: qty 9

## 2019-01-24 MED ORDER — METOPROLOL TARTRATE 25 MG PO TABS
25.0000 mg | ORAL_TABLET | Freq: Two times a day (BID) | ORAL | Status: DC
  Administered 2019-01-24 – 2019-01-26 (×2): 25 mg via ORAL
  Filled 2019-01-24 (×4): qty 1

## 2019-01-24 MED ORDER — CIPROFLOXACIN IN D5W 400 MG/200ML IV SOLN
INTRAVENOUS | Status: AC
  Filled 2019-01-24: qty 200

## 2019-01-24 MED ORDER — INSULIN ASPART 100 UNIT/ML ~~LOC~~ SOLN
0.0000 [IU] | Freq: Every day | SUBCUTANEOUS | Status: DC
  Administered 2019-01-24: 4 [IU] via SUBCUTANEOUS

## 2019-01-24 MED ORDER — TAMSULOSIN HCL 0.4 MG PO CAPS
0.8000 mg | ORAL_CAPSULE | ORAL | Status: AC
  Administered 2019-01-24: 0.8 mg via ORAL
  Filled 2019-01-24: qty 2

## 2019-01-24 MED ORDER — PROPOFOL 10 MG/ML IV BOLUS
INTRAVENOUS | Status: DC | PRN
  Administered 2019-01-24: 180 mg via INTRAVENOUS

## 2019-01-24 MED ORDER — BISACODYL 10 MG RE SUPP: 10.0000 mg | Freq: Every day | RECTAL | Status: DC | PRN

## 2019-01-24 MED ORDER — GADOBUTROL 1 MMOL/ML IV SOLN
8.5000 mL | Freq: Once | INTRAVENOUS | Status: AC | PRN
  Administered 2019-01-24: 8.5 mL via INTRAVENOUS

## 2019-01-24 MED ORDER — HYDROXYZINE HCL 50 MG/ML IM SOLN: 50.0000 mg | INTRAMUSCULAR | Status: DC | PRN

## 2019-01-24 MED ORDER — ONDANSETRON HCL 4 MG/2ML IJ SOLN
INTRAMUSCULAR | Status: DC | PRN
  Administered 2019-01-24: 4 mg via INTRAVENOUS

## 2019-01-24 MED ORDER — FENTANYL CITRATE (PF) 250 MCG/5ML IJ SOLN
INTRAMUSCULAR | Status: AC
  Filled 2019-01-24: qty 5

## 2019-01-24 MED ORDER — FENTANYL CITRATE (PF) 100 MCG/2ML IJ SOLN
25.0000 ug | INTRAMUSCULAR | Status: DC | PRN
  Administered 2019-01-24 (×2): 50 ug via INTRAVENOUS

## 2019-01-24 MED ORDER — ACETAMINOPHEN 10 MG/ML IV SOLN
INTRAVENOUS | Status: DC | PRN
  Administered 2019-01-24: 1000 mg via INTRAVENOUS

## 2019-01-24 MED ORDER — SODIUM CHLORIDE 0.9% FLUSH: 3.0000 mL | Freq: Two times a day (BID) | INTRAVENOUS | Status: DC

## 2019-01-24 MED ORDER — ATORVASTATIN CALCIUM 10 MG PO TABS
20.0000 mg | ORAL_TABLET | Freq: Every day | ORAL | Status: DC
  Administered 2019-01-25 – 2019-01-27 (×3): 20 mg via ORAL
  Filled 2019-01-24 (×3): qty 2

## 2019-01-24 MED ORDER — ACETAMINOPHEN 325 MG PO TABS: 650.0000 mg | ORAL_TABLET | ORAL | Status: DC | PRN

## 2019-01-24 MED ORDER — ALUM & MAG HYDROXIDE-SIMETH 200-200-20 MG/5ML PO SUSP: 30.0000 mL | Freq: Four times a day (QID) | ORAL | Status: DC | PRN

## 2019-01-24 MED ORDER — HYDROXYZINE HCL 25 MG PO TABS: 50.0000 mg | ORAL_TABLET | ORAL | Status: DC | PRN

## 2019-01-24 MED ORDER — LACTATED RINGERS IV SOLN
INTRAVENOUS | Status: DC
  Administered 2019-01-24: 14:00:00 via INTRAVENOUS

## 2019-01-24 MED ORDER — KETOROLAC TROMETHAMINE 15 MG/ML IJ SOLN
INTRAMUSCULAR | Status: AC
  Filled 2019-01-24: qty 1

## 2019-01-24 MED ORDER — LEVOTHYROXINE SODIUM 150 MCG PO TABS
150.0000 ug | ORAL_TABLET | ORAL | Status: DC
  Administered 2019-01-25 – 2019-01-27 (×2): 150 ug via ORAL
  Filled 2019-01-24: qty 1
  Filled 2019-01-24: qty 2
  Filled 2019-01-24: qty 1
  Filled 2019-01-24: qty 2

## 2019-01-24 MED ORDER — KETOROLAC TROMETHAMINE 30 MG/ML IJ SOLN
15.0000 mg | Freq: Four times a day (QID) | INTRAMUSCULAR | Status: AC
  Administered 2019-01-24 – 2019-01-26 (×6): 15 mg via INTRAVENOUS
  Filled 2019-01-24 (×5): qty 1

## 2019-01-24 MED ORDER — VANCOMYCIN HCL 1000 MG IV SOLR
INTRAVENOUS | Status: AC
  Filled 2019-01-24: qty 1000

## 2019-01-24 MED ORDER — SODIUM CHLORIDE 0.9 % IV SOLN
INTRAVENOUS | Status: DC | PRN
  Administered 2019-01-24: 20 ug/min via INTRAVENOUS

## 2019-01-24 MED ORDER — ROCURONIUM BROMIDE 50 MG/5ML IV SOSY
PREFILLED_SYRINGE | INTRAVENOUS | Status: AC
  Filled 2019-01-24: qty 5

## 2019-01-24 MED ORDER — TELMISARTAN-HCTZ 80-25 MG PO TABS: 1.0000 | ORAL_TABLET | Freq: Every day | ORAL | Status: DC

## 2019-01-24 MED FILL — Heparin Sodium (Porcine) Inj 1000 Unit/ML: INTRAMUSCULAR | Qty: 60 | Status: AC

## 2019-01-24 MED FILL — Sodium Chloride IV Soln 0.9%: INTRAVENOUS | Qty: 2000 | Status: AC

## 2019-01-24 SURGICAL SUPPLY — 42 items
BAG DECANTER FOR FLEXI CONT (MISCELLANEOUS) ×2 IMPLANT
CANISTER SUCT 3000ML PPV (MISCELLANEOUS) ×2 IMPLANT
CARTRIDGE OIL MAESTRO DRILL (MISCELLANEOUS) ×1 IMPLANT
COVER WAND RF STERILE (DRAPES) ×2 IMPLANT
DIFFUSER DRILL AIR PNEUMATIC (MISCELLANEOUS) ×2 IMPLANT
DRAPE LAPAROTOMY 100X72X124 (DRAPES) ×2 IMPLANT
DRAPE POUCH INSTRU U-SHP 10X18 (DRAPES) ×2 IMPLANT
DRSG ADAPTIC 3X8 NADH LF (GAUZE/BANDAGES/DRESSINGS) ×1 IMPLANT
ELECT REM PT RETURN 9FT ADLT (ELECTROSURGICAL) ×2
ELECTRODE REM PT RTRN 9FT ADLT (ELECTROSURGICAL) ×1 IMPLANT
EVACUATOR 3/16  PVC DRAIN (DRAIN) ×1
EVACUATOR 3/16 PVC DRAIN (DRAIN) IMPLANT
GAUZE 4X4 16PLY RFD (DISPOSABLE) IMPLANT
GAUZE SPONGE 4X4 12PLY STRL (GAUZE/BANDAGES/DRESSINGS) ×1 IMPLANT
GLOVE BIOGEL PI IND STRL 8 (GLOVE) ×1 IMPLANT
GLOVE BIOGEL PI INDICATOR 8 (GLOVE) ×1
GLOVE ECLIPSE 7.5 STRL STRAW (GLOVE) ×2 IMPLANT
GLOVE EXAM NITRILE XL STR (GLOVE) IMPLANT
GOWN STRL REUS W/ TWL LRG LVL3 (GOWN DISPOSABLE) ×1 IMPLANT
GOWN STRL REUS W/ TWL XL LVL3 (GOWN DISPOSABLE) IMPLANT
GOWN STRL REUS W/TWL 2XL LVL3 (GOWN DISPOSABLE) IMPLANT
GOWN STRL REUS W/TWL LRG LVL3 (GOWN DISPOSABLE) ×2
GOWN STRL REUS W/TWL XL LVL3 (GOWN DISPOSABLE)
KIT BASIN OR (CUSTOM PROCEDURE TRAY) ×2 IMPLANT
KIT TURNOVER KIT B (KITS) ×2 IMPLANT
NDL SPNL 22GX3.5 QUINCKE BK (NEEDLE) ×1 IMPLANT
NEEDLE SPNL 22GX3.5 QUINCKE BK (NEEDLE) ×2 IMPLANT
NS IRRIG 1000ML POUR BTL (IV SOLUTION) ×2 IMPLANT
OIL CARTRIDGE MAESTRO DRILL (MISCELLANEOUS) ×2
PACK LAMINECTOMY NEURO (CUSTOM PROCEDURE TRAY) ×2 IMPLANT
PAD ARMBOARD 7.5X6 YLW CONV (MISCELLANEOUS) ×6 IMPLANT
SPONGE SURGIFOAM ABS GEL SZ50 (HEMOSTASIS) IMPLANT
STAPLER SKIN PROX WIDE 3.9 (STAPLE) ×1 IMPLANT
SUT VIC AB 1 CT1 18XBRD ANBCTR (SUTURE) ×1 IMPLANT
SUT VIC AB 1 CT1 8-18 (SUTURE) ×2
SUT VIC AB 2-0 CP2 18 (SUTURE) ×2 IMPLANT
SWAB COLLECTION DEVICE MRSA (MISCELLANEOUS) IMPLANT
SWAB CULTURE ESWAB REG 1ML (MISCELLANEOUS) IMPLANT
TAPE CLOTH SURG 4X10 WHT LF (GAUZE/BANDAGES/DRESSINGS) ×1 IMPLANT
TOWEL GREEN STERILE (TOWEL DISPOSABLE) ×2 IMPLANT
TOWEL GREEN STERILE FF (TOWEL DISPOSABLE) ×2 IMPLANT
WATER STERILE IRR 1000ML POUR (IV SOLUTION) ×2 IMPLANT

## 2019-01-24 NOTE — ED Provider Notes (Signed)
[facility] Emergency Department Provider Note MRN:  [id number]  Arrival date & time: 01/25/19     Chief Complaint   Urinary Retention   History of Present Illness   Christopher Randolph is a 73 y.o. year-old male with a history of CAD, diabetes, PAD presenting to the ED with chief complaint of urinary retention.  Unable to urinate for almost 3 days.  Is sometimes able to pass dribbles of urine.  Developing progressively worsening lower abdominal pressure and discomfort.  Patient was recently diagnosed with neurogenic claudication due to lumbar stenosis and underwent decompression surgery 6 days ago.  Endorsing continued back pain at the surgical site, largely unchanged.  Denying any numbness or weakness.  Denies fever, no chest pain or shortness of breath.  No abdominal pain.  Review of Systems  A complete 10 system review of systems was obtained and all systems are negative except as noted in the HPI and PMH.   Patient's Health History    Past Medical History:  Diagnosis Date  . Arthritis   . CAD (coronary artery disease)    Branch vessel and moderate RCA disease 2006  . Cancer ([place]) 1990   Melanoma Lower right Leg  . Carotid artery disease ([place])   . Diabetes ([place])   . Essential hypertension   . Hyperlipidemia   . Hypothyroidism   . Lyme disease   . PAD (peripheral artery disease) ([place])    Left common iliac stent 2004  . PONV (postoperative nausea and vomiting)     Past Surgical History:  Procedure Laterality Date  . CATARACT EXTRACTION Bilateral   . CATARACT EXTRACTION, BILATERAL    . CERVICAL DISC SURGERY    . COLONOSCOPY N/A 11/06/2016   Procedure: COLONOSCOPY;  Surgeon: Daneil Dolin, MD;  Location: [facility];  Service: Endoscopy;  Laterality: N/A;  7:30 AM  . COLONOSCOPY  2012   Dr. Anthony Sar: normal. reviewed reports, which states he has a history of polyps in remote past.   . EYE SURGERY    . JOINT REPLACEMENT    . MELANOMA EXCISION     right  leg, seen at [facility] and underwent immunotherapy  . NECK SURGERY    . REPLACEMENT TOTAL KNEE      Family History  Problem Relation Age of Onset  . Heart disease Mother   . Heart attack Mother   . Colon polyps Mother   . Heart attack Maternal Grandmother   . Heart attack Maternal Grandfather   . Colon polyps Brother   . Colon cancer Neg Hx     Social History   Socioeconomic History  . Marital status: Married    Spouse name: Not on file  . Number of children: Not on file  . Years of education: Not on file  . Highest education level: Not on file  Occupational History  . Not on file  Social Needs  . Financial resource strain: Not on file  . Food insecurity:    Worry: Not on file    Inability: Not on file  . Transportation needs:    Medical: Not on file    Non-medical: Not on file  Tobacco Use  . Smoking status: Former Smoker    Packs/day: 1.50    Years: 40.00    Pack years: 60.00    Types: Cigarettes    Start date: 11/04/1963    Last attempt to quit: 10/29/2003    Years since quitting: 15.2  . Smokeless tobacco: Never  Used  Substance and Sexual Activity  . Alcohol use: Yes    Alcohol/week: 0.0 standard drinks    Comment: one miller lite daily   . Drug use: No  . Sexual activity: Not on file  Lifestyle  . Physical activity:    Days per week: Not on file    Minutes per session: Not on file  . Stress: Not on file  Relationships  . Social connections:    Talks on phone: Not on file    Gets together: Not on file    Attends religious service: Not on file    Active member of club or organization: Not on file    Attends meetings of clubs or organizations: Not on file    Relationship status: Not on file  . Intimate partner violence:    Fear of current or ex partner: Not on file    Emotionally abused: Not on file    Physically abused: Not on file    Forced sexual activity: Not on file  Other Topics Concern  . Not on file  Social History Narrative  . Not on file      Physical Exam  Vital Signs and Nursing Notes reviewed Vitals:   01/25/19 0428 01/25/19 0700  BP: 127/67 120/84  Pulse: (!) 59 60  Resp:    Temp: (!) 97.4 F (36.3 C) 97.8 F (36.6 C)  SpO2: 99% 98%    CONSTITUTIONAL: Well-appearing, NAD NEURO:  Alert and oriented x 3, 4 out of 5 strength to bilateral lower extremities, diminished reflexes at the patella EYES:  eyes equal and reactive ENT/NECK:  no LAD, no JVD CARDIO: Regular rate, well-perfused, normal S1 and S2 PULM:  CTAB no wheezing or rhonchi GI/GU:  normal bowel sounds, non-distended, non-tender MSK/SPINE:  No gross deformities, no edema SKIN:  no rash, atraumatic PSYCH:  Appropriate speech and behavior  Diagnostic and Interventional Summary    Labs Reviewed  URINALYSIS, ROUTINE W REFLEX MICROSCOPIC - Abnormal; Notable for the following components:      Result Value   APPearance HAZY (*)    Hgb urine dipstick MODERATE (*)    Leukocytes, UA MODERATE (*)    Bacteria, UA MANY (*)    All other components within normal limits  BASIC METABOLIC PANEL - Abnormal; Notable for the following components:   Glucose, Bld 165 (*)    Calcium 8.8 (*)    All other components within normal limits  CBC - Abnormal; Notable for the following components:   RBC 3.30 (*)    Hemoglobin 9.8 (*)    HCT 30.8 (*)    nRBC 0.3 (*)    All other components within normal limits  GLUCOSE, CAPILLARY - Abnormal; Notable for the following components:   Glucose-Capillary 114 (*)    All other components within normal limits  GLUCOSE, CAPILLARY - Abnormal; Notable for the following components:   Glucose-Capillary 157 (*)    All other components within normal limits  GLUCOSE, CAPILLARY - Abnormal; Notable for the following components:   Glucose-Capillary 305 (*)    All other components within normal limits  GLUCOSE, CAPILLARY - Abnormal; Notable for the following components:   Glucose-Capillary 195 (*)    All other components within normal  limits  URINE CULTURE  TYPE AND SCREEN    MR Lumbar Spine W Wo Contrast  Final Result      Medications  atorvastatin (LIPITOR) tablet 20 mg (has no administration in time range)  brimonidine (ALPHAGAN) 0.2 % ophthalmic  solution 1 drop (1 drop Left Eye Not Given 01/24/19 2044)  dorzolamide-timolol (COSOPT) 22.3-6.8 MG/ML ophthalmic solution 1 drop (1 drop Left Eye Not Given 01/24/19 2045)  nitroGLYCERIN (NITROSTAT) SL tablet 0.4 mg (has no administration in time range)  metoprolol tartrate (LOPRESSOR) tablet 25 mg (25 mg Oral Given 01/24/19 2037)  metFORMIN (GLUCOPHAGE) tablet 500 mg (500 mg Oral Given 01/25/19 0740)  Loteprednol Etabonate 1 % SUSP 1 drop (1 drop Left Eye Not Given 01/24/19 2046)  levothyroxine (SYNTHROID, LEVOTHROID) tablet 175 mcg (has no administration in time range)  levothyroxine (SYNTHROID, LEVOTHROID) tablet 150 mcg (150 mcg Oral Given 01/25/19 0459)  HYDROcodone-acetaminophen (NORCO/VICODIN) 5-325 MG per tablet 1-2 tablet (2 tablets Oral Given 01/25/19 0458)  fluticasone (FLONASE) 50 MCG/ACT nasal spray 2 spray (has no administration in time range)  acetaminophen (TYLENOL) tablet 650 mg (has no administration in time range)    Or  acetaminophen (TYLENOL) suppository 650 mg (has no administration in time range)  cyclobenzaprine (FLEXERIL) tablet 5-10 mg (10 mg Oral Given 01/25/19 0458)  magnesium hydroxide (MILK OF MAGNESIA) suspension 30 mL (has no administration in time range)  bisacodyl (DULCOLAX) suppository 10 mg (has no administration in time range)  sodium phosphate (FLEET) 7-19 GM/118ML enema 1 enema (has no administration in time range)  alum & mag hydroxide-simeth (MAALOX/MYLANTA) 200-200-20 MG/5ML suspension 30 mL (has no administration in time range)  menthol-cetylpyridinium (CEPACOL) lozenge 3 mg (has no administration in time range)    Or  phenol (CHLORASEPTIC) mouth spray 1 spray (has no administration in time range)  insulin aspart (novoLOG) injection  0-15 Units (3 Units Subcutaneous Given 01/25/19 0739)  insulin aspart (novoLOG) injection 0-5 Units (4 Units Subcutaneous Given 01/24/19 2116)  0.9 % NaCl with KCl 20 mEq/ L  infusion (has no administration in time range)  ketorolac (TORADOL) 30 MG/ML injection 15 mg (15 mg Intravenous Given 01/25/19 0500)  hydrOXYzine (ATARAX/VISTARIL) tablet 50 mg (has no administration in time range)  hydrOXYzine (VISTARIL) injection 50 mg (has no administration in time range)  morphine 4 MG/ML injection 4-8 mg (has no administration in time range)  ketorolac (TORADOL) 15 MG/ML injection (has no administration in time range)  fentaNYL (SUBLIMAZE) 100 MCG/2ML injection (has no administration in time range)  irbesartan (AVAPRO) tablet 300 mg (300 mg Oral Not Given 01/24/19 1911)  hydrochlorothiazide (HYDRODIURIL) tablet 25 mg (25 mg Oral Given 01/25/19 0500)  ciprofloxacin (CIPRO) tablet 500 mg (500 mg Oral Given 01/25/19 0740)  tamsulosin (FLOMAX) capsule 0.4 mg (0.4 mg Oral Given 01/25/19 0740)  oxyCODONE (Oxy IR/ROXICODONE) immediate release tablet 5 mg (5 mg Oral Given 01/24/19 1019)  gadobutrol (GADAVIST) 1 MMOL/ML injection 8.5 mL (8.5 mLs Intravenous Contrast Given 01/24/19 1217)  ceFAZolin (ANCEF) IVPB 2g/100 mL premix (2 g Intravenous Given 01/24/19 1452)  ciprofloxacin (CIPRO) 400 MG/200ML IVPB (  Override pull for Anesthesia 01/24/19 1503)  ceFAZolin (ANCEF) 2-4 GM/100ML-% IVPB (  Override pull for Anesthesia 01/24/19 1452)  ketorolac (TORADOL) 30 MG/ML injection 15 mg (15 mg Intravenous Given 01/24/19 1629)  tamsulosin (FLOMAX) capsule 0.8 mg (0.8 mg Oral Given 01/24/19 2037)     Procedures Critical Care Critical Care Documentation Critical care time provided by me (excluding procedures): 42 minutes  Condition necessitating critical care: Cauda equina syndrome, need for emergent surgery  Components of critical care management: reviewing of prior records, laboratory and imaging interpretation, frequent  re-examination and reassessment of vital signs, discussion with consulting service.    ED Course and Medical Decision Making  I  have reviewed the triage vital signs and the nursing notes.  Pertinent labs & imaging results that were available during my care of the patient were reviewed by me and considered in my medical decision making (see below for details).  Patient has no history of BPH, and in the setting of recent lumbar stenosis, neurogenic claudication, decompression surgery will need to exclude neurologic cause of urinary retention such as cauda equina.  MRI pending.  Clinical Course as of Jan 25 913  Tue Jan 24, 2019  1314 Discussed case with Dr. Sherwood Gambler of neurosurgery.  MRI concerning for large seroma versus hematoma compressing the thecal sac, worrisome for cauda equina.  Dr. Sherwood Gambler will come to evaluate the patient here in the emergency department, patient made n.p.o.   [MB]    Clinical Course User Index [MB] Sedonia Small Barth Kirks, MD    Admitted to the neurosurgery service for further care.  Barth Kirks. Sedonia Small, [place] [email or web address]  Final Clinical Impressions(s) / ED Diagnoses     ICD-10-CM   1. Urinary retention R33.9   2. Cauda equina syndrome (HCC) G83.4   3. Postoperative seroma involving nervous system after nervous system procedure G97.63     ED Discharge Orders    None         Maudie Flakes, MD 01/25/19 [phone]

## 2019-01-24 NOTE — ED Triage Notes (Signed)
Pt endorses unable to urinate x 3 days. Pt in obvious discomfort. Bladder scan shows 989ml of urine. Foley ordered and placed. VSS

## 2019-01-24 NOTE — Progress Notes (Signed)
Vitals:   01/24/19 1651 01/24/19 1706 01/24/19 1713 01/24/19 1742  BP: 129/61 (!) 114/59 123/62 (!) 124/53  Pulse: 76 75 69 65  Resp: 13 13 16 18   Temp:   98 F (36.7 C) 98.2 F (36.8 C)  TempSrc:    Oral  SpO2: 95% 95% 97% 100%  Weight:      Height:        CBC Recent Labs    01/24/19 0909  WBC 8.0  HGB 9.8*  HCT 30.8*  PLT 151   BMET Recent Labs    01/24/19 0909  NA 136  K 4.0  CL 99  CO2 26  GLUCOSE 165*  BUN 22  CREATININE 0.94  CALCIUM 8.8*    Patient sitting up in bed, eating dinner.  Dressing clean and dry.  Foley to straight drainage.  We will continue Cipro 500 p.o. twice daily, pending urine culture.  Will initiate Flomax because of presenting with urinary retention.  Encouraged to ambulate a couple of times this evening with the staff.  Plan: To mobilize this evening.  Will keep Foley to straight drainage.  Continue to progress through postoperative recovery.  Hosie Spangle, MD 01/24/2019, 6:56 PM

## 2019-01-24 NOTE — Op Note (Signed)
01/24/2019  4:29 PM  PATIENT:  Christopher Randolph  73 y.o. male  PRE-OPERATIVE DIAGNOSIS:  Lumbar wound seroma/hematoma with resulting spinal stenosis with urinary retention  POST-OPERATIVE DIAGNOSIS:  Lumbar wound seroma/hematoma with resulting spinal stenosis with urinary retention  PROCEDURE:  Procedure(s): Lumbar wound exploration and evacuation of wound seroma/hematoma  SURGEON:  Surgeon(s): Jovita Gamma, MD  ANESTHESIA:   general  EBL:  Total I/O In: -  Out: 95 [Urine:75; Blood:20]  BLOOD ADMINISTERED:none  COUNT:  Correct per nursing staff  DRAINS: Bilateral large Hemovac drains in laminectomy defect/epidural space   DICTATION: Patient was brought the operating room, placed under general endotracheal anesthesia.  Patient was turned to a prone position.  The existing Dermabond was removed from the wound and the lumbar region was prepped with Betadine soap and solution, and draped in sterile fashion.  The previous midline incision was opened in its inferior three quarters extent.  Dissection was carried down through the subcutaneous tissue to the fascia and sutures were cut and the paraspinal musculature was retracted with self-retaining retractors.  As the fascia was opened hematoma/seroma drained under mild pressure.  We continued to evacuate the hematoma/seroma exposing the posterior instrumentation, laminectomy defect, and thecal sac.  The wound hematoma/seroma was fully evacuated.  We irrigated the wound with 1 L of saline and 500 cc of bacitracin solution.  2 Hemovac drains were brought out through separate stab incisions and connected to a single closed collection system.  The drains were laid over the lateral edges of the laminectomy defect on each side.  We instilled 1 gram of vancomycin powder into the wound.  We then proceeded with closure.  The paraspinal muscles, deep fascia, and Scarpa's fascia were closed in separate layers with undyed 1 Vicryl sutures.  The  subcutaneous and subcuticular closed with interrupted inverted 2-0 undyed Vicryl sutures.  The Hemovac drains were each sutured with a 3-0 nylon suture.  Skin edges were approximated with surgical staples.  A dressing of Adaptic, sterile gauze, and Hypafix was applied.  Following surgery the patient was turned back to supine position, to be reversed in anesthetic, extubated, and transferred to the recovery room for further care.  PLAN OF CARE: Admit to inpatient   PATIENT DISPOSITION:  PACU - hemodynamically stable.   Delay start of Pharmacological VTE agent (>24hrs) due to surgical blood loss or risk of bleeding:  yes

## 2019-01-24 NOTE — Anesthesia Procedure Notes (Signed)
Procedure Name: Intubation Date/Time: 01/24/2019 3:03 PM Performed by: Julieta Bellini, CRNA Pre-anesthesia Checklist: Patient identified, Emergency Drugs available, Suction available and Patient being monitored Patient Re-evaluated:Patient Re-evaluated prior to induction Oxygen Delivery Method: Circle system utilized Preoxygenation: Pre-oxygenation with 100% oxygen Induction Type: IV induction Ventilation: Mask ventilation without difficulty and Oral airway inserted - appropriate to patient size Laryngoscope Size: Mac and 4 Grade View: Grade I Tube type: Oral Tube size: 7.5 mm Number of attempts: 1 Airway Equipment and Method: Stylet Placement Confirmation: ETT inserted through vocal cords under direct vision,  positive ETCO2 and breath sounds checked- equal and bilateral Secured at: 22 cm Tube secured with: Tape Dental Injury: Teeth and Oropharynx as per pre-operative assessment

## 2019-01-24 NOTE — ED Notes (Signed)
Pt feeling immediate relief after foley insertion.

## 2019-01-24 NOTE — Anesthesia Preprocedure Evaluation (Addendum)
Anesthesia Evaluation  Patient identified by MRN, date of birth, ID band Patient awake    Reviewed: Allergy & Precautions, NPO status , Patient's Chart, lab work & pertinent test results  History of Anesthesia Complications (+) PONV and history of anesthetic complications  Airway Mallampati: II  TM Distance: >3 FB Neck ROM: Full    Dental  (+) Dental Advisory Given, Edentulous Upper, Missing,    Pulmonary former smoker,    Pulmonary exam normal breath sounds clear to auscultation       Cardiovascular hypertension, Pt. on home beta blockers and Pt. on medications + CAD and + Peripheral Vascular Disease  Normal cardiovascular exam Rhythm:Regular Rate:Normal  Nuclear stress test 07/29/2018 [facility]): Impression:  1.  No reversible ischemia or infarction. 2.  Normal left ventricular wall motion. 3.  Left ventricular ejection fraction 72%. 4.  Noninvasive risk stratification: Low.Nuclear stress test 07/29/2018 [facility]): Impression:  1.  No reversible ischemia or infarction. 2.  Normal left ventricular wall motion. 3.  Left ventricular ejection fraction 72%. 4.  Noninvasive risk stratification: Low.Nuclear stress test    Neuro/Psych S/P lumbar decompression 6 d ago negative neurological ROS     GI/Hepatic negative GI ROS, Neg liver ROS,   Endo/Other  diabetes, Type 2, Oral Hypoglycemic AgentsHypothyroidism   Renal/GU negative Renal ROS     Musculoskeletal  (+) Arthritis ,   Abdominal   Peds  Hematology negative hematology ROS (+)   Anesthesia Other Findings Day of surgery medications reviewed with the patient.  Reproductive/Obstetrics                          Anesthesia Physical Anesthesia Plan  ASA: III  Anesthesia Plan: General   Post-op Pain Management:    Induction: Intravenous  PONV Risk Score and Plan: Treatment may vary due to age or medical condition, Ondansetron,  Dexamethasone and Midazolam  Airway Management Planned: Oral ETT  Additional Equipment:   Intra-op Plan:   Post-operative Plan: Extubation in OR  Informed Consent: I have reviewed the patients History and Physical, chart, labs and discussed the procedure including the risks, benefits and alternatives for the proposed anesthesia with the patient or authorized representative who has indicated his/her understanding and acceptance.     Dental advisory given  Plan Discussed with: CRNA  Anesthesia Plan Comments:        Anesthesia Quick Evaluation

## 2019-01-24 NOTE — H&P (Signed)
Subjective: Patient is a 73 y.o. right-handed white male who is admitted for treatment of lumbar wound seroma/hematoma.  Patient is 6 days status post a L3-S1 decompressive lumbar laminectomy, L3-4, L4-5, L5-S1 posterior lumbar interbody arthrodesis with interbody implants and bone graft, and a L3-S1 posterior lateral arthrodesis with posterior instrumentation and bone graft.  He was discharged to home on the second postoperative day, comfortable and ambulating.  The past several days he had had difficulties with constipation; he and his family were able to remedy that situation.  However began to have some difficulty with urinary voiding 2 days ago, became more pronounced yesterday, and he was unable to void last night, and came to the [facility] emergency room today.  A Foley catheter was placed and nearly a liter of urine was drained, the Foley catheter was left in place.  Urinalysis showed bacteria and 21-50 WBC.  MRI was obtained by the emergency room staff and showed a significant seroma/hematoma in the spinal canal, with significant thecal sac compression and neurosurgery consultation was requested.  Symptomatically the patient notes that he has been comfortable and ambulating.  With the stenosis and thecal sac compression caused by the seroma/hematoma, I have recommended to the patient and his wife admission to the hospital for surgical exploration of his wound and evacuation of the seroma/hematoma, with the plan to place a drain within the epidural space/laminectomy defect.   Patient Active Problem List   Diagnosis Date Noted  . 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 Heme positive stool 10/30/2016  . Systolic murmur   . Dizziness   . Carotid artery disease ([place])   . PAD (peripheral artery disease) ([place])   . CAD (coronary artery disease)   . Hypertension   . Hypothyroidism   . History of tobacco abuse   . Ejection fraction   . Dyslipidemia    Past Medical  History:  Diagnosis Date  . Arthritis   . CAD (coronary artery disease)    Branch vessel and moderate RCA disease 2006  . Cancer ([place]) 1990   Melanoma Lower right Leg  . Carotid artery disease ([place])   . Diabetes ([place])   . Essential hypertension   . Hyperlipidemia   . Hypothyroidism   . Lyme disease   . PAD (peripheral artery disease) ([place])    Left common iliac stent 2004  . PONV (postoperative nausea and vomiting)     Past Surgical History:  Procedure Laterality Date  . CATARACT EXTRACTION Bilateral   . CATARACT EXTRACTION, BILATERAL    . CERVICAL DISC SURGERY    . COLONOSCOPY N/A 11/06/2016   Procedure: COLONOSCOPY;  Surgeon: Daneil Dolin, MD;  Location: [facility];  Service: Endoscopy;  Laterality: N/A;  7:30 AM  . COLONOSCOPY  2012   Dr. Anthony Sar: normal. reviewed reports, which states he has a history of polyps in remote past.   . EYE SURGERY    . JOINT REPLACEMENT    . MELANOMA EXCISION     right leg, seen at [facility] and underwent immunotherapy  . NECK SURGERY    . REPLACEMENT TOTAL KNEE      (Not in a hospital admission)  Allergies  Allergen Reactions  . Azithromycin Swelling  . Shrimp [Shellfish Allergy] Other (See Comments)    Gout flares    Social History   Tobacco Use  . Smoking status: Former Smoker    Packs/day: 1.50    Years: 40.00    Pack years: 60.00  Types: Cigarettes    Start date: 11/04/1963    Last attempt to quit: 10/29/2003    Years since quitting: 15.2  . Smokeless tobacco: Never Used  Substance Use Topics  . Alcohol use: Yes    Alcohol/week: 0.0 standard drinks    Comment: one miller lite daily     Family History  Problem Relation Age of Onset  . Heart disease Mother   . Heart attack Mother   . Colon polyps Mother   . Heart attack Maternal Grandmother   . Heart attack Maternal Grandfather   . Colon polyps Brother   . Colon cancer Neg Hx      Review of Systems Pertinent items noted in HPI and remainder of comprehensive ROS  otherwise negative.  Objective: Vital signs in last 24 hours: Temp:  [98.2 F (36.8 C)] 98.2 F (36.8 C) (01/28 0907) Pulse Rate:  [90] 90 (01/28 0907) Resp:  [18] 18 (01/28 0907) BP: (122)/(58) 122/58 (01/28 0907) SpO2:  [100 %] 100 % (01/28 0907)  EXAM: Patient well-developed well-nourished white male, in no acute distress.   Lungs are clear to auscultation , the patient has symmetrical respiratory excursion. Heart has a regular rate and rhythm normal S1 and S2 no murmur.   Abdomen is soft nontender nondistended bowel sounds are present. Extremity examination shows no clubbing cyanosis or edema.  Wound is clean and dry; there is no erythema, ecchymosis, swelling, or drainage. Motor examination shows iliopsoas is 4+ to 5/5, and the quadriceps, dorsiflexor, and plantar flexor are 5/5.  Sensation is intact to pinprick in the distal lower extremities.  Reflexes are symmetrical.  Gait and stance were not tested.  Data Review:CBC    Component Value Date/Time   WBC 8.0 01/24/2019 0909   RBC 3.30 (L) 01/24/2019 0909   HGB 9.8 (L) 01/24/2019 0909   HCT 30.8 (L) 01/24/2019 0909   PLT 151 01/24/2019 0909   MCV 93.3 01/24/2019 0909   MCH 29.7 01/24/2019 0909   MCHC 31.8 01/24/2019 0909   RDW 13.7 01/24/2019 0909   LYMPHSABS 1.4 06/23/2010 0800   MONOABS 0.4 06/23/2010 0800   EOSABS 0.2 06/23/2010 0800   BASOSABS 0.0 06/23/2010 0800                          BMET    Component Value Date/Time   NA 136 01/24/2019 0909   K 4.0 01/24/2019 0909   CL 99 01/24/2019 0909   CO2 26 01/24/2019 0909   GLUCOSE 165 (H) 01/24/2019 0909   BUN 22 01/24/2019 0909   CREATININE 0.94 01/24/2019 0909   CALCIUM 8.8 (L) 01/24/2019 0909   GFRNONAA >60 01/24/2019 0909   GFRAA >60 01/24/2019 0909     Assessment/Plan: Patient 6 days status post multilevel lumbar decompression and stabilization, who developed urinary retention at home, and presented to the emergency room.  Foley catheter was placed, and MRI  scan obtained and revealed a significant seroma/hematoma in the posterior lumbar spinal canal with thecal sac compression.  Patient admitted now for wound exploration and evacuation of seroma/hematoma.  I have discussed the situation with the patient and his wife, the recommendation for surgery, and risks surgical risk of infection, bleeding, possibly transfusion, the risk of neurologic deficit, and anesthetic risks of microinfarction, stroke, pneumonia, and death.  All this patient wants to be admitted and to proceed with surgery.   Hosie Spangle, MD 01/24/2019 1:56 PM

## 2019-01-25 ENCOUNTER — Encounter (HOSPITAL_COMMUNITY): Payer: Self-pay | Admitting: Neurosurgery

## 2019-01-25 LAB — GLUCOSE, CAPILLARY
Glucose-Capillary: 195 mg/dL — ABNORMAL HIGH (ref 70–99)
Glucose-Capillary: 167 mg/dL — ABNORMAL HIGH (ref 70–99)
Glucose-Capillary: 130 mg/dL — ABNORMAL HIGH (ref 70–99)
Glucose-Capillary: 196 mg/dL — ABNORMAL HIGH (ref 70–99)

## 2019-01-25 MED ORDER — SUGAMMADEX SODIUM 200 MG/2ML IV SOLN
INTRAVENOUS | Status: DC | PRN
  Administered 2019-01-24: 160 mg via INTRAVENOUS

## 2019-01-25 NOTE — Clinical Social Work Note (Signed)
Clinical Social Work Assessment  Patient Details  Name: Christopher Randolph MRN: [id number] Date of Birth: 02-28-1946  Date of referral:  01/25/19               Reason for consult:  Facility Placement, Discharge Planning                Permission sought to share information with:  Facility Sport and exercise psychologist, Family Supports Permission granted to share information::  Yes, Verbal Permission Granted  Name::     Banker::  SNFs  Relationship::  spouse  Contact Information:  [phone]  Housing/Transportation Living arrangements for the past 2 months:  Single Family Home Source of Information:  Patient Patient Interpreter Needed:  None Criminal Activity/Legal Involvement Pertinent to Current Situation/Hospitalization:  No - Comment as needed Significant Relationships:  Adult Children, Spouse Lives with:  Spouse Do you feel safe going back to the place where you live?  Yes Need for family participation in patient care:  No (Coment)  Care giving concerns: Patient from home with wife. Admitted for lumbar wound seroma/hematoma with resulting spinal stenosis. PT recommending SNF.   Social Worker assessment / plan: CSW met with patient and daughter at bedside. Patient alert and oriented. CSW introduced self and role and discussed disposition planning - recommendation for SNF.  Patient is agreeable to SNF and prefers [facility]. Patient lives at home with his wife. He wants to get stronger at rehab and then return home. He helps provide care for his mother, and is hopeful to get back to that.  CSW sent out initial SNF referrals. [facility] has offered a bed and can take patient on Friday, 01/27/19. CSW to follow and support with discharge planning.  Employment status:  Retired Forensic scientist:  Medicare PT Recommendations:  [place] / Referral to community resources:  [place]  Patient/Family's Response to care:  Patient and family appreciative of care.  Patient/Family's Understanding of and Emotional Response to Diagnosis, Current Treatment, and Prognosis: Patient and family with good understanding of patient's conditions and care needs. Agreeable to SNF.  Emotional Assessment Appearance:  Appears stated age Attitude/Demeanor/Rapport:  Engaged Affect (typically observed):  Accepting, Calm, Appropriate, Pleasant Orientation:  Oriented to Self, Oriented to Place, Oriented to  Time, Oriented to Situation Alcohol / Substance use:  Not Applicable Psych involvement (Current and /or in the community):  No (Comment)  Discharge Needs  Concerns to be addressed:  Discharge Planning Concerns, Care Coordination Readmission within the last 30 days:  Yes Current discharge risk:  Physical Impairment Barriers to Discharge:  Continued Medical Work up   Estanislado Emms, LCSW 01/25/2019, 3:36 PM

## 2019-01-25 NOTE — Evaluation (Signed)
Physical Therapy Evaluation Patient Details Name: Christopher Randolph MRN: [id number] DOB: Jul 26, 1946 Today's Date: 01/25/2019   History of Present Illness  Pt is a 73 y/o male who presents s/p L3-S1 PLIF on 01/18/2019. PMH significant for PAD, Lyme Disease, hypothyroidism, HTN, DM, CAD, CA, TKR, cervical surgery.  Clinical Impression  Pt admitted with above diagnosis. Pt currently with functional limitations due to the deficits listed below (see PT Problem List). At the time of PT eval pt was able to perform transfers and ambulation with gross min guard assist to min assist for balance support and safety with RW use. Pt moving slower and more guarded than previous admission, and we discussed several rehab options at d/c to maximize functional independence and safety prior to return home with family. Pt requesting SNF over CIR or HHPT. Feel this is appropriate given tolerance for functional activity. Pt will benefit from skilled PT to increase their independence and safety with mobility to allow discharge to the venue listed below.       Follow Up Recommendations SNF;Supervision for mobility/OOB    Equipment Recommendations  3in1 (PT)    Recommendations for Other Services       Precautions / Restrictions Precautions Precautions: Fall;Back Precaution Booklet Issued: Yes (comment) Precaution Comments: Reviewed handout with pt and daughter Required Braces or Orthoses: Spinal Brace Spinal Brace: Lumbar corset;Applied in standing position Restrictions Weight Bearing Restrictions: No      Mobility  Bed Mobility Overal bed mobility: Needs Assistance Bed Mobility: Rolling;Sit to Sidelying Rolling: Supervision       Sit to sidelying: Min assist General bed mobility comments: Assist to elevate LE's up into bed.   Transfers Overall transfer level: Modified independent Equipment used: Rolling walker (2 wheeled) Transfers: Sit to/from Stand Sit to Stand: Min guard         General  transfer comment: Pt demonstrated proper hand placement on seated surface for safety. No assist required however increased time needed to achieve full stand.   Ambulation/Gait Ambulation/Gait assistance: Min guard;Supervision Gait Distance (Feet): 250 Feet Assistive device: Rolling walker (2 wheeled) Gait Pattern/deviations: Step-through pattern;Decreased stride length;Trunk flexed Gait velocity: Decreased Gait velocity interpretation: 1.31 - 2.62 ft/sec, indicative of limited community ambulator General Gait Details: Slow and guarded. Pt initially required min guard assist progressing to supervision for safety.   Stairs            Wheelchair Mobility    Modified Rankin (Stroke Patients Only)       Balance Overall balance assessment: Needs assistance Sitting-balance support: Feet supported;No upper extremity supported Sitting balance-Leahy Scale: Fair     Standing balance support: Bilateral upper extremity supported;During functional activity Standing balance-Leahy Scale: Poor Standing balance comment: Reliant on UE support on RW for balance.                              Pertinent Vitals/Pain Pain Assessment: Faces Faces Pain Scale: Hurts little more Pain Location: Back Pain Descriptors / Indicators: Operative site guarding;Discomfort;Grimacing Pain Intervention(s): Limited activity within patient's tolerance;Monitored during session;Repositioned    Home Living Family/patient expects to be discharged to:: Private residence Living Arrangements: Spouse/significant other Available Help at Discharge: Family;Available 24 hours/day(Not able to provide physical assistance) Type of Home: House Home Access: Stairs to enter Entrance Stairs-Rails: None Entrance Stairs-Number of Steps: 2 Home Layout: One level Home Equipment: Walker - 2 wheels      Prior Function Level of Independence: Needs assistance  Gait / Transfers Assistance Needed: Using RW  ADL's /  Homemaking Assistance Needed: Needs assist from family for mobility since initial surgery        Hand Dominance        Extremity/Trunk Assessment   Upper Extremity Assessment Upper Extremity Assessment: Overall WFL for tasks assessed    Lower Extremity Assessment Lower Extremity Assessment: Generalized weakness(Consistent with pre-op diagnosis)    Cervical / Trunk Assessment Cervical / Trunk Assessment: Other exceptions Cervical / Trunk Exceptions: s/p surgery  Communication   Communication: No difficulties  Cognition Arousal/Alertness: Awake/alert Behavior During Therapy: WFL for tasks assessed/performed Overall Cognitive Status: Within Functional Limits for tasks assessed                                        General Comments      Exercises     Assessment/Plan    PT Assessment Patient needs continued PT services  PT Problem List Decreased strength;Decreased activity tolerance;Decreased balance;Decreased mobility;Decreased knowledge of use of DME;Decreased safety awareness;Decreased knowledge of precautions;Pain       PT Treatment Interventions DME instruction;Gait training;Stair training;Therapeutic activities;Functional mobility training;Therapeutic exercise;Neuromuscular re-education;Patient/family education    PT Goals (Current goals can be found in the Care Plan section)  Acute Rehab PT Goals Patient Stated Goal: Rehab at d/c to get better PT Goal Formulation: With patient/family Time For Goal Achievement: 02/08/19 Potential to Achieve Goals: Good    Frequency Min 5X/week   Barriers to discharge        Co-evaluation               AM-PAC PT "6 Clicks" Mobility  Outcome Measure Help needed turning from your back to your side while in a flat bed without using bedrails?: None Help needed moving from lying on your back to sitting on the side of a flat bed without using bedrails?: A Little Help needed moving to and from a bed to a  chair (including a wheelchair)?: A Little Help needed standing up from a chair using your arms (e.g., wheelchair or bedside chair)?: A Little Help needed to walk in hospital room?: A Little Help needed climbing 3-5 steps with a railing? : A Little 6 Click Score: 19    End of Session Equipment Utilized During Treatment: Gait belt;Back brace Activity Tolerance: Patient limited by fatigue;Patient limited by pain Patient left: in bed;with call bell/phone within reach;with family/visitor present Nurse Communication: Mobility status PT Visit Diagnosis: Unsteadiness on feet (R26.81);Pain;Other symptoms and signs involving the nervous system (R29.898) Pain - part of body: (back)    Time: 3300-7622 PT Time Calculation (min) (ACUTE ONLY): 23 min   Charges:   PT Evaluation $PT Eval Moderate Complexity: 1 Mod PT Treatments $Gait Training: 8-22 mins        Rolinda Roan, PT, DPT Acute Rehabilitation Services Pager: [phone] Office: [place] 01/25/2019, 1:54 PM

## 2019-01-25 NOTE — NC FL2 (Signed)
[place] LEVEL OF CARE SCREENING TOOL     IDENTIFICATION  Patient Name: Christopher Randolph Birthdate: 07-04-1946 Sex: male Admission Date (Current Location): 01/24/2019  [facility] and [place] Number:  Herbalist and Address:  [facility] [place], [place] [address] [place], [place] [address]      Provider Number: [id number]  Attending Physician Name and Address:  Jovita Gamma, MD  Relative Name and Phone Number:  Gonzalo Waymire, spouse, [phone]    Current Level of Care: Hospital Recommended Level of Care: [place] Prior Approval Number:    Date Approved/Denied:   PASRR Number: [id number]  Discharge Plan: SNF    Current Diagnoses: Patient Active Problem List   Diagnosis Date Noted  . 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 Heme positive stool 10/30/2016  . Systolic murmur   . Dizziness   . Carotid artery disease ([place])   . PAD (peripheral artery disease) ([place])   . CAD (coronary artery disease)   . Hypertension   . Hypothyroidism   . History of tobacco abuse   . Ejection fraction   . Dyslipidemia     Orientation RESPIRATION BLADDER Height & Weight     Self, Time, Situation, Place  Normal Continent Weight: 83.8 kg Height:  5' 7.99" (172.7 cm)  BEHAVIORAL SYMPTOMS/MOOD NEUROLOGICAL BOWEL NUTRITION STATUS      Continent Diet(please see DC summary)  AMBULATORY STATUS COMMUNICATION OF NEEDS Skin   Limited Assist Verbally Surgical wounds(closed incision on back)                       Personal Care Assistance Level of Assistance  Bathing, Feeding, Dressing Bathing Assistance: Limited assistance Feeding assistance: Independent Dressing Assistance: Limited assistance     Functional Limitations Info  Sight, Hearing, Speech Sight Info: Adequate Hearing Info: Adequate Speech Info: Adequate    SPECIAL CARE FACTORS FREQUENCY  PT (By licensed PT)     PT Frequency: 5x/week              Contractures Contractures Info: Not present    Additional Factors Info  Code Status, Allergies, Insulin Sliding Scale Code Status Info: Full Allergies Info: Azithromycin, Shrimp (Shellfish Allergy)   Insulin Sliding Scale Info: novolog 3x/day with meals and at bedtime       Current Medications (01/25/2019):  This is the current hospital active medication list Current Facility-Administered Medications  Medication Dose Route Frequency Provider Last Rate Last Dose  . 0.9 % NaCl with KCl 20 mEq/ L  infusion   Intravenous Continuous Jovita Gamma, MD      . acetaminophen (TYLENOL) tablet 650 mg  650 mg Oral Q4H PRN Jovita Gamma, MD       Or  . acetaminophen (TYLENOL) suppository 650 mg  650 mg Rectal Q4H PRN Jovita Gamma, MD      . alum & mag hydroxide-simeth (MAALOX/MYLANTA) 200-200-20 MG/5ML suspension 30 mL  30 mL Oral Q6H PRN Jovita Gamma, MD      . atorvastatin (LIPITOR) tablet 20 mg  20 mg Oral Daily Jovita Gamma, MD   20 mg at 01/25/19 0929  . bisacodyl (DULCOLAX) suppository 10 mg  10 mg Rectal Daily PRN Jovita Gamma, MD      . brimonidine (ALPHAGAN) 0.2 % ophthalmic solution 1 drop  1 drop Left Eye BID Jovita Gamma, MD      . ciprofloxacin (CIPRO) tablet 500 mg  500 mg Oral BID Jovita Gamma, MD  500 mg at 01/25/19 0740  . dorzolamide-timolol (COSOPT) 22.3-6.8 MG/ML ophthalmic solution 1 drop  1 drop Left Eye BID Jovita Gamma, MD      . fluticasone Asencion Islam) 50 MCG/ACT nasal spray 2 spray  2 spray Each Nare Daily Jovita Gamma, MD   2 spray at 01/25/19 0932  . hydrochlorothiazide (HYDRODIURIL) tablet 25 mg  25 mg Oral Daily Vertis Kelch L, RPH   25 mg at 01/25/19 0500  . HYDROcodone-acetaminophen (NORCO/VICODIN) 5-325 MG per tablet 1-2 tablet  1-2 tablet Oral Q4H PRN Jovita Gamma, MD   1 tablet at 01/25/19 3220  . hydrOXYzine (ATARAX/VISTARIL) tablet 50 mg  50 mg Oral Q4H PRN Jovita Gamma, MD      . hydrOXYzine (VISTARIL)  injection 50 mg  50 mg Intramuscular Q4H PRN Jovita Gamma, MD      . insulin aspart (novoLOG) injection 0-15 Units  0-15 Units Subcutaneous TID WC Jovita Gamma, MD   3 Units at 01/25/19 1206  . insulin aspart (novoLOG) injection 0-5 Units  0-5 Units Subcutaneous QHS Jovita Gamma, MD   4 Units at 01/24/19 2116  . irbesartan (AVAPRO) tablet 300 mg  300 mg Oral Daily Ronna Polio, RPH   300 mg at 01/25/19 0929  . ketorolac (TORADOL) 30 MG/ML injection 15 mg  15 mg Intravenous Q6H Jovita Gamma, MD   15 mg at 01/25/19 1103  . levothyroxine (SYNTHROID, LEVOTHROID) tablet 150 mcg  150 mcg Oral Nancy Fetter, MD   150 mcg at 01/25/19 0459  . [START ON 01/26/2019] levothyroxine (SYNTHROID, LEVOTHROID) tablet 175 mcg  175 mcg Oral Nancy Fetter, MD      . Loteprednol Etabonate 1 % SUSP 1 drop  1 drop Left Eye BID Jovita Gamma, MD      . magnesium hydroxide (MILK OF MAGNESIA) suspension 30 mL  30 mL Oral Daily PRN Jovita Gamma, MD      . menthol-cetylpyridinium (CEPACOL) lozenge 3 mg  1 lozenge Oral PRN Jovita Gamma, MD       Or  . phenol (CHLORASEPTIC) mouth spray 1 spray  1 spray Mouth/Throat PRN Jovita Gamma, MD      . metFORMIN (GLUCOPHAGE) tablet 500 mg  500 mg Oral Q breakfast Jovita Gamma, MD   500 mg at 01/25/19 0740  . metoprolol tartrate (LOPRESSOR) tablet 25 mg  25 mg Oral BID Jovita Gamma, MD   25 mg at 01/24/19 2037  . morphine 4 MG/ML injection 4-8 mg  4-8 mg Intravenous Q3H PRN Jovita Gamma, MD      . nitroGLYCERIN (NITROSTAT) SL tablet 0.4 mg  0.4 mg Sublingual Q5 min PRN Jovita Gamma, MD      . sodium phosphate (FLEET) 7-19 GM/118ML enema 1 enema  1 enema Rectal Once PRN Jovita Gamma, MD      . tamsulosin [facility]) capsule 0.4 mg  0.4 mg Oral QPC breakfast Jovita Gamma, MD   0.4 mg at 01/25/19 0740     Discharge Medications: Please see discharge summary for a list of discharge medications.  Relevant Imaging  Results:  Relevant Lab Results:   Additional Information SSN: [id number]  Estanislado Emms, LCSW

## 2019-01-25 NOTE — Progress Notes (Signed)
Vitals:   01/25/19 0026 01/25/19 0428 01/25/19 0700 01/25/19 1056  BP: 134/65 127/67 120/84 135/68  Pulse: 68 (!) 59 60 (!) 58  Resp: 18   18  Temp: 98.1 F (36.7 C) (!) 97.4 F (36.3 C) 97.8 F (36.6 C) 97.8 F (36.6 C)  TempSrc: Oral Oral Oral Oral  SpO2: 97% 99% 98% 97%  Weight:      Height:        CBC Recent Labs    01/24/19 0909  WBC 8.0  HGB 9.8*  HCT 30.8*  PLT 151   BMET Recent Labs    01/24/19 0909  NA 136  K 4.0  CL 99  CO2 26  GLUCOSE 165*  BUN 22  CREATININE 0.94  CALCIUM 8.8*    Patient sitting up in the chair, we had him get up, and ambulate using his rolling walker.  Moderate weakness in hip flexors, with difficulty rising from a seated position to standing.  On the other hand ambulated fairly well down the hallway using the rolling walker.  Earlier this morning he had significant fatigue when walking in the hallway, but had been given a Flexeril earlier in the morning.  Moderate drainage into Hemovac drain, will reassess in 24 hours.  Dressing clean and dry.  Foley to straight drainage.  We will plan on voiding trial in a.m.  Plan: We will consult PT regarding mobilization, transfers, and ambulation.  I spoke with the patient regarding further rehabilitation following discharge including options of skilled nursing facility for rehabilitation, which I favor, versus home health PT.  He would like to pursue the [facility] skilled nursing facility for postoperative rehabilitation.  Spoke with the patient, his wife and his daughter, Wells Guiles, regarding my assessment and recommendations.  Hosie Spangle, MD 01/25/2019, 11:28 AM

## 2019-01-25 NOTE — Transfer of Care (Signed)
Immediate Anesthesia Transfer of Care Note  Patient: Christopher Randolph  Procedure(s) Performed: LUMBAR WOUND Exploration for Evacation of Seroma vs. Hematoma (N/A )  Patient Location: PACU  Anesthesia Type:General  Level of Consciousness: awake, alert  and oriented  Airway & Oxygen Therapy: Patient Spontanous Breathing  Post-op Assessment: Report given to RN, Post -op Vital signs reviewed and stable and Patient moving all extremities X 4  Post vital signs: Reviewed and stable  Last Vitals:  Vitals Value Taken Time  BP    Temp    Pulse    Resp    SpO2      Last Pain:  Vitals:   01/25/19 0700  TempSrc: Oral  PainSc:       Patients Stated Pain Goal: 3 (74/14/23 9532)  Complications: No apparent anesthesia complications

## 2019-01-25 NOTE — Anesthesia Postprocedure Evaluation (Signed)
Anesthesia Post Note  Patient: Christopher Randolph  Procedure(s) Performed: LUMBAR WOUND Exploration for Evacation of Seroma vs. Hematoma (N/A )     Patient location during evaluation: PACU Anesthesia Type: General Level of consciousness: awake and alert Pain management: pain level controlled Vital Signs Assessment: post-procedure vital signs reviewed and stable Respiratory status: spontaneous breathing, nonlabored ventilation and respiratory function stable Cardiovascular status: blood pressure returned to baseline and stable Postop Assessment: no apparent nausea or vomiting Anesthetic complications: no    Last Vitals:  Vitals:   01/25/19 1056 01/25/19 1541  BP: 135/68 (!) 112/54  Pulse: (!) 58 81  Resp: 18 (!) 1  Temp: 36.6 C 36.8 C  SpO2: 97% 93%    Last Pain:  Vitals:   01/25/19 1541  TempSrc: Oral  PainSc:    Pain Goal: Patients Stated Pain Goal: 4 (01/25/19 1441)                 Brennan Bailey

## 2019-01-26 LAB — GLUCOSE, CAPILLARY
Glucose-Capillary: 120 mg/dL — ABNORMAL HIGH (ref 70–99)
Glucose-Capillary: 118 mg/dL — ABNORMAL HIGH (ref 70–99)
Glucose-Capillary: 115 mg/dL — ABNORMAL HIGH (ref 70–99)
Glucose-Capillary: 145 mg/dL — ABNORMAL HIGH (ref 70–99)

## 2019-01-26 LAB — URINE CULTURE
Culture: 80000 — AB
Special Requests: NORMAL

## 2019-01-26 MED ORDER — DOCUSATE SODIUM 100 MG PO CAPS
100.0000 mg | ORAL_CAPSULE | Freq: Two times a day (BID) | ORAL | Status: DC
  Administered 2019-01-26 – 2019-01-27 (×2): 100 mg via ORAL
  Filled 2019-01-26: qty 1

## 2019-01-26 NOTE — Progress Notes (Signed)
Physical Therapy Treatment Patient Details Name: Christopher Randolph MRN: [id number] DOB: 08/12/1946 Today's Date: 01/26/2019    History of Present Illness Pt is a 73 y/o male who presents s/p L3-S1 PLIF on 01/18/2019. PMH significant for PAD, Lyme Disease, hypothyroidism, HTN, DM, CAD, CA, TKR, cervical surgery.    PT Comments    Pt progressing towards physical therapy goals. Was able to perform transfers and ambulation with gross min guard assist to supervision for safety with RW for support. Pt appears more fatigued today and reports feeling "exhausted". Reinforced need for rest and recommended shorter walks to conserve energy. Noted pt with increased WOB throughout gait training, requiring 1 standing rest break (<30") to recover. Will continue to follow and progress as able per POC.     Follow Up Recommendations  SNF;Supervision for mobility/OOB     Equipment Recommendations  3in1 (PT)    Recommendations for Other Services       Precautions / Restrictions Precautions Precautions: Fall;Back Precaution Booklet Issued: Yes (comment) Precaution Comments: Reviewed handout with pt and daughter Required Braces or Orthoses: Spinal Brace Spinal Brace: Lumbar corset;Applied in standing position Restrictions Weight Bearing Restrictions: No    Mobility  Bed Mobility               General bed mobility comments: Pt sitting up EOB when PT arrived.   Transfers Overall transfer level: Needs assistance Equipment used: Rolling walker (2 wheeled) Transfers: Sit to/from Stand Sit to Stand: Supervision         General transfer comment: Pt demonstrated proper hand placement on seated surface for safety. No assist required however increased time needed to achieve full stand.   Ambulation/Gait Ambulation/Gait assistance: Min guard;Supervision Gait Distance (Feet): 225 Feet Assistive device: Rolling walker (2 wheeled) Gait Pattern/deviations: Step-through pattern;Decreased stride  length;Trunk flexed Gait velocity: Decreased Gait velocity interpretation: 1.31 - 2.62 ft/sec, indicative of limited community ambulator General Gait Details: Slow and guarded. Pt initially required min guard assist progressing to supervision for safety. 1 standing rest break required due to fatigue and DOE.    Stairs             Wheelchair Mobility    Modified Rankin (Stroke Patients Only)       Balance Overall balance assessment: Needs assistance Sitting-balance support: Feet supported;No upper extremity supported Sitting balance-Leahy Scale: Fair     Standing balance support: Bilateral upper extremity supported;During functional activity Standing balance-Leahy Scale: Poor Standing balance comment: Reliant on UE support on RW for balance.                             Cognition Arousal/Alertness: Awake/alert Behavior During Therapy: WFL for tasks assessed/performed Overall Cognitive Status: Within Functional Limits for tasks assessed                                        Exercises      General Comments        Pertinent Vitals/Pain Pain Assessment: Faces Faces Pain Scale: Hurts even more Pain Location: Back Pain Descriptors / Indicators: Operative site guarding;Discomfort;Grimacing Pain Intervention(s): Monitored during session    Home Living                      Prior Function            PT Goals (current  goals can now be found in the care plan section) Acute Rehab PT Goals Patient Stated Goal: Rehab at d/c to get better PT Goal Formulation: With patient/family Time For Goal Achievement: 02/08/19 Potential to Achieve Goals: Good Progress towards PT goals: Progressing toward goals    Frequency    Min 5X/week      PT Plan Current plan remains appropriate    Co-evaluation              AM-PAC PT "6 Clicks" Mobility   Outcome Measure  Help needed turning from your back to your side while in a flat  bed without using bedrails?: None Help needed moving from lying on your back to sitting on the side of a flat bed without using bedrails?: A Little Help needed moving to and from a bed to a chair (including a wheelchair)?: A Little Help needed standing up from a chair using your arms (e.g., wheelchair or bedside chair)?: A Little Help needed to walk in hospital room?: A Little Help needed climbing 3-5 steps with a railing? : A Lot 6 Click Score: 18    End of Session Equipment Utilized During Treatment: Gait belt;Back brace Activity Tolerance: Patient limited by fatigue;Patient limited by pain Patient left: in bed;with call bell/phone within reach;with family/visitor present Nurse Communication: Mobility status PT Visit Diagnosis: Unsteadiness on feet (R26.81);Pain;Other symptoms and signs involving the nervous system (R29.898) Pain - part of body: (back)     Time: 5830-9407 PT Time Calculation (min) (ACUTE ONLY): 12 min  Charges:  $Gait Training: 8-22 mins                     Rolinda Roan, PT, DPT Acute Rehabilitation Services Pager: [phone] Office: [phone]    Thelma Comp 01/26/2019, 8:26 AM

## 2019-01-26 NOTE — Social Work (Signed)
[facility] can accept patient with foley catheter if needed; MD will just need to document reason for foley in the discharge summary. SNF can transport patient to a urology appointment in [place].  CSW to follow and support with discharge tomorrow.  Estanislado Emms, LCSW [phone]

## 2019-01-26 NOTE — Progress Notes (Signed)
Vitals:   01/25/19 1908 01/25/19 2337 01/26/19 0457 01/26/19 0722  BP: (!) 107/52 124/64 122/68 130/69  Pulse: 80 65 64 64  Resp: 18 18 18 16   Temp: 98 F (36.7 C) (!) 97.5 F (36.4 C) (!) 97.5 F (36.4 C) 97.7 F (36.5 C)  TempSrc: Oral Oral Oral Oral  SpO2: 92% 94% 93% 93%  Weight:      Height:        CBC Recent Labs    01/24/19 0909  WBC 8.0  HGB 9.8*  HCT 30.8*  PLT 151   BMET Recent Labs    01/24/19 0909  NA 136  K 4.0  CL 99  CO2 26  GLUCOSE 165*  BUN 22  CREATININE 0.94  CALCIUM 8.8*    Patient up and about.  Foley DC'd at 0500, no void yet, nursing staff monitoring voiding function and they are going to check a bladder scan shortly.  Drainage into Hemovac has been steadily diminishing, and we went ahead and remove the dressing from the wound, and remove the Hemovac, prep the exit sites with Betadine, stable to exercise close, and apply dressings to the exit sites.  The incision itself was left open to air.  The incision has no erythema, ecchymosis, or drainage.  Urine culture from the day of admission grew out 80,000 E. coli, sensitive to Cipro.  Patient continues on Cipro 500 mg p.o. twice daily.  Arrangements have been made for transfer to [place] tomorrow for rehabilitation.  FL 2 signed.  Has continued to work with physical therapy today.  Seems most tired first thing in the morning, with better energy level later in the day.  Walked with the patient in the hallways, he is using a rolling walker.  Plan: Continue postoperative care including physical therapy, nursing staff to monitor voiding function, patient continues on Cipro and Flomax.  Hosie Spangle, MD 01/26/2019, 11:04 AM

## 2019-01-27 DIAGNOSIS — M5136 Other intervertebral disc degeneration, lumbar region: Secondary | ICD-10-CM | POA: Diagnosis not present

## 2019-01-27 DIAGNOSIS — M47816 Spondylosis without myelopathy or radiculopathy, lumbar region: Secondary | ICD-10-CM | POA: Diagnosis not present

## 2019-01-27 DIAGNOSIS — R2689 Other abnormalities of gait and mobility: Secondary | ICD-10-CM | POA: Diagnosis not present

## 2019-01-27 DIAGNOSIS — Z7401 Bed confinement status: Secondary | ICD-10-CM | POA: Diagnosis not present

## 2019-01-27 DIAGNOSIS — M9973 Connective tissue and disc stenosis of intervertebral foramina of lumbar region: Secondary | ICD-10-CM | POA: Diagnosis not present

## 2019-01-27 DIAGNOSIS — G9763 Postprocedural seroma of a nervous system organ or structure following a nervous system procedure: Secondary | ICD-10-CM | POA: Diagnosis not present

## 2019-01-27 DIAGNOSIS — N319 Neuromuscular dysfunction of bladder, unspecified: Secondary | ICD-10-CM | POA: Diagnosis not present

## 2019-01-27 DIAGNOSIS — M6281 Muscle weakness (generalized): Secondary | ICD-10-CM | POA: Diagnosis not present

## 2019-01-27 DIAGNOSIS — N281 Cyst of kidney, acquired: Secondary | ICD-10-CM | POA: Diagnosis not present

## 2019-01-27 DIAGNOSIS — N390 Urinary tract infection, site not specified: Secondary | ICD-10-CM | POA: Diagnosis not present

## 2019-01-27 DIAGNOSIS — Z981 Arthrodesis status: Secondary | ICD-10-CM | POA: Diagnosis not present

## 2019-01-27 DIAGNOSIS — R5381 Other malaise: Secondary | ICD-10-CM | POA: Diagnosis not present

## 2019-01-27 DIAGNOSIS — B9620 Unspecified Escherichia coli [E. coli] as the cause of diseases classified elsewhere: Secondary | ICD-10-CM | POA: Diagnosis not present

## 2019-01-27 DIAGNOSIS — R339 Retention of urine, unspecified: Secondary | ICD-10-CM | POA: Diagnosis not present

## 2019-01-27 DIAGNOSIS — G834 Cauda equina syndrome: Secondary | ICD-10-CM | POA: Diagnosis not present

## 2019-01-27 DIAGNOSIS — N401 Enlarged prostate with lower urinary tract symptoms: Secondary | ICD-10-CM | POA: Diagnosis not present

## 2019-01-27 DIAGNOSIS — M2550 Pain in unspecified joint: Secondary | ICD-10-CM | POA: Diagnosis not present

## 2019-01-27 DIAGNOSIS — M48062 Spinal stenosis, lumbar region with neurogenic claudication: Secondary | ICD-10-CM | POA: Diagnosis not present

## 2019-01-27 DIAGNOSIS — Z4789 Encounter for other orthopedic aftercare: Secondary | ICD-10-CM | POA: Diagnosis not present

## 2019-01-27 DIAGNOSIS — R338 Other retention of urine: Secondary | ICD-10-CM | POA: Diagnosis not present

## 2019-01-27 LAB — GLUCOSE, CAPILLARY
Glucose-Capillary: 117 mg/dL — ABNORMAL HIGH (ref 70–99)
Glucose-Capillary: 82 mg/dL (ref 70–99)
Glucose-Capillary: 116 mg/dL — ABNORMAL HIGH (ref 70–99)

## 2019-01-27 MED ORDER — TAMSULOSIN HCL 0.4 MG PO CAPS: 0.4000 mg | ORAL_CAPSULE | Freq: Every day | ORAL | 3 refills | Status: DC

## 2019-01-27 MED ORDER — CIPROFLOXACIN HCL 500 MG PO TABS: 500.0000 mg | ORAL_TABLET | Freq: Two times a day (BID) | ORAL | 0 refills | Status: DC

## 2019-01-27 NOTE — Discharge Summary (Signed)
Physician Discharge Summary  Patient ID: Christopher Randolph MRN: [id number] DOB/AGE: 71-27-1947 71 y.o.  Admit date: 01/24/2019 Discharge date: 01/27/2019  Admission Diagnoses:  Lumbar wound seroma/hematoma with resulting spinal stenosis with urinary retention  Discharge Diagnoses:  Lumbar wound seroma/hematoma with resulting spinal stenosis with urinary retention, urinary tract infection with E. coli (sensitive to ciprofloxacin), continued urinary retention (failed voiding trial 1/30, Foley reinserted), deconditioning Active Problems:   Lumbar stenosis with neurogenic claudication   Discharged Condition: good  Hospital Course: Patient 9 days ago underwent an L3-S1 decompressive lumbar laminectomy, L3-4, L4-5, and L5-S1 posterior lumbar interbody arthrodesis with interbody implants and bone graft, and an L3-S1 posterior lateral arthrodesis with posterior instrumentation and bone graft.  He was discharged to home on the second postoperative day, but presented to the [facility] emergency room the day of admission because of urinary retention.  A Foley catheter was placed and drained about 1 L of urine.  The EDP obtained a MRI of the lumbar spine which revealed a significant seroma/hematoma in the laminectomy defect and epidural space causing thecal sac compression, the patient was admitted and taken to surgery for a lumbar wound exploration and evacuation of the wound seroma/hematoma.  Hemovac drain was placed, and had moderate output the first day, with diminishing output the second day.  It was removed 2 days after surgery.  The wound is healing nicely, and the drain exit site was stapled closed.  The wound shows no erythema, ecchymosis, or drainage.  Patient has had moderate deconditioning prior to his initial surgery, and was seen in consultation by physical therapy was worked with the patient throughout his hospitalization.  Overall his strength and stamina has been gradually improving, but  in consultation with the patient, his family, physical therapy, and myself it was recommended that he undergo rehabilitation in a skilled nursing facility, and the social work staff has arranged for him to be transferred to the [facility] today for continued rehabilitation including physical therapy.  Urinalysis with a Foley was placed in the emergency room showed bacteria and 21-50 WBC.  Urine culture was sent and grew 80,000 E. coli, sensitive ciprofloxacin.  Patient has been treated with 3 days of ciprofloxacin, and we have recommended an additional 5 days of Cipro 500 mg p.o. twice daily.  We did do a voiding trial yesterday.  Foley was DC'd at 0500.  There was no void by 1100, and bladder scan showed 500+ cc.  Patient was in and out cathed.  At 1700 patient still had not voided, and bladder scan again showed nearly 400 cc, and a decision was made to reinsert the Foley catheter to straight drainage because of persistent multifactorial urinary retention.  I discussed the situation with the patient and both of his daughters Secundino Ginger and Wells Guiles).  Patient will need to have the Foley catheter remain in place, and will require a urology consultation early during his care at [facility].  We will leave further recommendations up to the urology consultant, but I did explain to the family that a voiding trial may be tried again or the patient may need urodynamics.  Patient was started on Flomax 0.4 mg daily because of the urinary retention, and we have recommended that be continued.  We will need to see the patient in the office on February 10 for staple removal.  Consults: Physical therapy, social work  Discharge Exam: Blood pressure (!) 151/64, pulse (!) 58, temperature 97.7 F (36.5 C), temperature source  Oral, resp. rate 16, height 5' 7.99" (1.727 m), weight 83.8 kg, SpO2 100 %.  Disposition: Discharge disposition: 03-Skilled Nursing Facility       Discharge Instructions     Discharge wound care:   Complete by:  As directed    Leave the wound open to air. Shower daily with the wound uncovered. Water and soapy water should run over the incision area. Do not wash directly on the incision for 2 weeks.   Driving Restrictions   Complete by:  As directed    No driving for 2 weeks. May ride in the car locally now. May begin to drive locally in 2 weeks.   Other Restrictions   Complete by:  As directed    Walk gradually increasing distances out in the fresh air at least twice a day. Walking additional 6 times inside the house, gradually increasing distances, daily. No bending, lifting, or twisting. Perform activities between shoulder and waist height (that is at counter height when standing or table height when sitting).     Allergies as of 01/27/2019      Reactions   Azithromycin Swelling   Shrimp [shellfish Allergy] Other (See Comments)   Gout flares      Medication List    STOP taking these medications   cyclobenzaprine 10 MG tablet Commonly known as:  FLEXERIL   methocarbamol 500 MG tablet Commonly known as:  ROBAXIN     TAKE these medications   aspirin EC 81 MG tablet Take 81 mg by mouth daily.   atorvastatin 20 MG tablet Commonly known as:  LIPITOR TAKE ONE TABLET BY MOUTH DAILY   brimonidine 0.2 % ophthalmic solution Commonly known as:  ALPHAGAN Place 1 drop into the left eye 2 (two) times daily.   ciprofloxacin 500 MG tablet Commonly known as:  CIPRO Take 1 tablet (500 mg total) by mouth 2 (two) times daily.   dorzolamide-timolol 22.3-6.8 MG/ML ophthalmic solution Commonly known as:  COSOPT Place 1 drop into the left eye 2 (two) times daily.   FLONASE ALLERGY RELIEF 50 MCG/ACT nasal spray Generic drug:  fluticasone Place 2 sprays into both nostrils daily.   HYDROcodone-acetaminophen 5-325 MG tablet Commonly known as:  NORCO/VICODIN Take 1-2 tablets by mouth every 4 (four) hours as needed (pain).   levothyroxine 175 MCG  tablet Commonly known as:  SYNTHROID, LEVOTHROID Take 175 mcg by mouth every other day.   levothyroxine 150 MCG tablet Commonly known as:  SYNTHROID, LEVOTHROID Take 150 mcg by mouth every other day.   Loteprednol Etabonate 1 % Susp Commonly known as:  INVELTYS Place 1 drop into the left eye 2 (two) times daily.   metFORMIN 500 MG tablet Commonly known as:  GLUCOPHAGE Take 500 mg by mouth daily.   metoprolol tartrate 25 MG tablet Commonly known as:  LOPRESSOR Take 25 mg by mouth 2 (two) times daily.   mometasone 0.1 % ointment Commonly known as:  ELOCON Apply 1 application topically 2 (two) times daily as needed (for psorasis).   naproxen sodium 220 MG tablet Commonly known as:  ALEVE Take 440 mg by mouth daily as needed (for pain.).   nitroGLYCERIN 0.4 MG SL tablet Commonly known as:  NITROSTAT Place 1 tablet (0.4 mg total) under the tongue every 5 (five) minutes as needed for chest pain.   tamsulosin 0.4 MG Caps capsule Commonly known as:  FLOMAX Take 1 capsule (0.4 mg total) by mouth daily after breakfast.   telmisartan-hydrochlorothiazide 80-25 MG tablet Commonly known as:  MICARDIS HCT Take 1 tablet by mouth daily.            Discharge Care Instructions  (From admission, onward)         Start     Ordered   01/27/19 0000  Discharge wound care:    Comments:  Leave the wound open to air. Shower daily with the wound uncovered. Water and soapy water should run over the incision area. Do not wash directly on the incision for 2 weeks.   01/27/19 0843         Contact information for after-discharge care    Destination    [facility] Preferred SNF .   Service:  Skilled Nursing Contact information: [address] [place] [phone]              Signed: Hosie Spangle 01/27/2019, 8:43 AM

## 2019-01-27 NOTE — Social Work (Signed)
Patient will discharge to [facility] Anticipated discharge date: 01/27/2019 Family notified: [name], spouse Transportation by: Corey Harold - pickup will be scheduled for 1:30 pm  Nurse to call report to [phone]. Patient will go to [facility] at the facility, nurse to request [facility] nurse when calling report.  CSW signing off.  Estanislado Emms, [place]  Clinical Social Worker

## 2019-01-27 NOTE — Progress Notes (Signed)
Patient alert and oriented, mae's well, voiding adequate amount of urine through foley catheter. swallowing without difficulty, no c/o pain at time of discharge. Patient discharged to [facility] rehab by [facility].  Script and discharged instructions given to [place]. . Patient and family stated understanding of instructions given. Patient has an appointment with Dr. Sherwood Gambler on February 10. Report given to facility staff nurse prior to transporting patient.

## 2019-01-27 NOTE — Progress Notes (Signed)
Physical Therapy Treatment Patient Details Name: Christopher Randolph MRN: [id number] DOB: 1946-02-02 Today's Date: 01/27/2019    History of Present Illness Pt is a 73 y/o male who presents s/p L3-S1 PLIF on 01/18/2019. PMH significant for PAD, Lyme Disease, hypothyroidism, HTN, DM, CAD, CA, TKR, cervical surgery.    PT Comments    Pt progressing towards physical therapy goals. Was able to ambulate with less gait deviations this session, however pt has increased complaints of RLE pain. Pt reports improvement in symptoms after gait training and was positioned in the chair. Pt anticipates d/c to SNF today. Will continue to follow.    Follow Up Recommendations  SNF;Supervision for mobility/OOB     Equipment Recommendations  3in1 (PT)    Recommendations for Other Services       Precautions / Restrictions Precautions Precautions: Fall;Back Precaution Booklet Issued: Yes (comment) Precaution Comments: Reviewed handout with pt and daughter Required Braces or Orthoses: Spinal Brace Spinal Brace: Lumbar corset;Applied in standing position Restrictions Weight Bearing Restrictions: No    Mobility  Bed Mobility               General bed mobility comments: Pt sitting up EOB when PT arrived.   Transfers Overall transfer level: Needs assistance Equipment used: Rolling walker (2 wheeled) Transfers: Sit to/from Stand Sit to Stand: Supervision         General transfer comment: Pt demonstrated proper hand placement on seated surface for safety. No assist required however increased time needed to achieve full stand.   Ambulation/Gait Ambulation/Gait assistance: Min guard;Supervision Gait Distance (Feet): 200 Feet Assistive device: Rolling walker (2 wheeled) Gait Pattern/deviations: Step-through pattern;Decreased stride length;Trunk flexed Gait velocity: Decreased Gait velocity interpretation: 1.31 - 2.62 ft/sec, indicative of limited community ambulator General Gait Details: Slow  and guarded. Pt initially required min guard assist progressing to supervision for safety. 1 standing rest break required due to fatigue and DOE.    Stairs             Wheelchair Mobility    Modified Rankin (Stroke Patients Only)       Balance Overall balance assessment: Needs assistance Sitting-balance support: Feet supported;No upper extremity supported Sitting balance-Leahy Scale: Fair     Standing balance support: Bilateral upper extremity supported;During functional activity Standing balance-Leahy Scale: Poor Standing balance comment: Reliant on UE support on RW for balance.                             Cognition Arousal/Alertness: Awake/alert Behavior During Therapy: WFL for tasks assessed/performed Overall Cognitive Status: Within Functional Limits for tasks assessed                                        Exercises      General Comments        Pertinent Vitals/Pain Pain Assessment: Faces Faces Pain Scale: Hurts even more Pain Location: Back, right hip and leg Pain Descriptors / Indicators: Operative site guarding;Discomfort;Grimacing Pain Intervention(s): Monitored during session;Repositioned    Home Living                      Prior Function            PT Goals (current goals can now be found in the care plan section) Acute Rehab PT Goals Patient Stated Goal: Rehab at d/c to  get better PT Goal Formulation: With patient Time For Goal Achievement: 02/08/19 Potential to Achieve Goals: Good Progress towards PT goals: Progressing toward goals    Frequency    Min 5X/week      PT Plan Current plan remains appropriate    Co-evaluation              AM-PAC PT "6 Clicks" Mobility   Outcome Measure  Help needed turning from your back to your side while in a flat bed without using bedrails?: None Help needed moving from lying on your back to sitting on the side of a flat bed without using bedrails?: A  Little Help needed moving to and from a bed to a chair (including a wheelchair)?: A Little Help needed standing up from a chair using your arms (e.g., wheelchair or bedside chair)?: A Little Help needed to walk in hospital room?: A Little Help needed climbing 3-5 steps with a railing? : A Lot 6 Click Score: 18    End of Session Equipment Utilized During Treatment: Gait belt;Back brace Activity Tolerance: Patient limited by fatigue;Patient limited by pain Patient left: in chair;with call bell/phone within reach Nurse Communication: Mobility status PT Visit Diagnosis: Unsteadiness on feet (R26.81);Pain;Other symptoms and signs involving the nervous system (R29.898) Pain - part of body: (back)     Time: 3151-7616 PT Time Calculation (min) (ACUTE ONLY): 16 min  Charges:  $Gait Training: 8-22 mins                     Rolinda Roan, PT, DPT Acute Rehabilitation Services Pager: [phone] Office: [phone]    Thelma Comp 01/27/2019, 8:02 AM

## 2019-01-27 NOTE — Clinical Social Work Placement (Signed)
   CLINICAL SOCIAL WORK PLACEMENT  NOTE  Date:  01/27/2019  Patient Details  Name: Christopher Randolph MRN: [id number] Date of Birth: 10-12-1946  Clinical Social Work is seeking post-discharge placement for this patient at the [place] level of care (*CSW will initial, date and re-position this form in  chart as items are completed):  Yes   Patient/family provided with [place] Work Department's list of facilities offering this level of care within the geographic area requested by the patient (or if unable, by the patient's family).  Yes   Patient/family informed of their freedom to choose among providers that offer the needed level of care, that participate in Medicare, Medicaid or managed care program needed by the patient, have an available bed and are willing to accept the patient.  Yes   Patient/family informed of [facility]'s ownership interest in [facility] and [facility], as well as of the fact that they are under no obligation to receive care at these facilities.  PASRR submitted to EDS on       PASRR number received on       Existing PASRR number confirmed on 01/25/19     FL2 transmitted to all facilities in geographic area requested by pt/family on 01/25/19     FL2 transmitted to all facilities within larger geographic area on       Patient informed that his/her managed care company has contracts with or will negotiate with certain facilities, including the following:  [place])         Patient/family informed of bed offers received.  Patient chooses bed at [facility])     Physician recommends and patient chooses bed at      Patient to be transferred to [facility]) on 01/27/19.  Patient to be transferred to facility by [facility]     Patient family notified on 01/27/19 of transfer.  Name of family member notified:  [name], spouse      PHYSICIAN Please prepare prescriptions     Additional Comment:    _______________________________________________ Estanislado Emms, LCSW 01/27/2019, 10:12 AM

## 2019-01-28 DIAGNOSIS — R339 Retention of urine, unspecified: Secondary | ICD-10-CM | POA: Diagnosis not present

## 2019-02-02 DIAGNOSIS — R338 Other retention of urine: Secondary | ICD-10-CM | POA: Diagnosis not present

## 2019-02-02 DIAGNOSIS — N281 Cyst of kidney, acquired: Secondary | ICD-10-CM | POA: Diagnosis not present

## 2019-02-07 DIAGNOSIS — M48062 Spinal stenosis, lumbar region with neurogenic claudication: Secondary | ICD-10-CM | POA: Diagnosis not present

## 2019-02-07 DIAGNOSIS — Z981 Arthrodesis status: Secondary | ICD-10-CM | POA: Diagnosis not present

## 2019-02-07 DIAGNOSIS — M47816 Spondylosis without myelopathy or radiculopathy, lumbar region: Secondary | ICD-10-CM | POA: Diagnosis not present

## 2019-02-07 DIAGNOSIS — M5136 Other intervertebral disc degeneration, lumbar region: Secondary | ICD-10-CM | POA: Diagnosis not present

## 2019-02-14 DIAGNOSIS — R262 Difficulty in walking, not elsewhere classified: Secondary | ICD-10-CM | POA: Diagnosis not present

## 2019-02-14 DIAGNOSIS — R531 Weakness: Secondary | ICD-10-CM | POA: Diagnosis not present

## 2019-02-14 DIAGNOSIS — R2689 Other abnormalities of gait and mobility: Secondary | ICD-10-CM | POA: Diagnosis not present

## 2019-02-14 DIAGNOSIS — Z9889 Other specified postprocedural states: Secondary | ICD-10-CM | POA: Diagnosis not present

## 2019-02-14 NOTE — Progress Notes (Signed)
[facility] Note  02/15/2019     CHIEF COMPLAINT Patient presents for Retina Follow Up   HISTORY OF PRESENT ILLNESS: Christopher Randolph is a 73 y.o. male who presents to the clinic today for:   HPI    Retina Follow Up    Patient presents with  Other.  In left eye.  Severity is moderate.  Duration of 6 weeks.  Since onset it is stable.  I, the attending physician,  performed the HPI with the patient and updated documentation appropriately.          Comments    Pt presents for 6 week UGH syndrome f/u, pt states vision has been okay since last visit, pt denies flashes and pain, but says he does have some floaters, pt is using inveltys, cospot and brimonide gtts as directed, pt states he has had 2 sx on his back since he was here last        Last edited by [name], MD on 02/15/2019 12:16 PM. (History)    Patient has had 2 back sx in the past month, pt just came home from a rehab center this past Saturday, pt is very happy with his results, pt states he is not having any problems with his vision, he states sometimes when he is outside his distance vision doesn't seem as clear, pt is using inveltys, Cosopt and brimonidine gtts as directed  Referring physician: Curlene Labrum, MD [place], [place] [address]  HISTORICAL INFORMATION:   Selected notes from the MEDICAL RECORD NUMBER Referred by Dr. [name] for concern of vitreous hemorrhage LEE: 05.21.19 [name]) [BCVA: OD: OS: ] Ocular Hx-Psedophakia OU,  PMH-DM (on Metformin), Arthritis     CURRENT MEDICATIONS: Current Outpatient Medications (Ophthalmic Drugs)  Medication Sig  . brimonidine (ALPHAGAN) 0.2 % ophthalmic solution Place 1 drop into the left eye 2 (two) times daily.  . dorzolamide-timolol (COSOPT) 22.3-6.8 MG/ML ophthalmic solution Place 1 drop into the left eye 2 (two) times daily.  . Loteprednol Etabonate (INVELTYS) 1 % SUSP Place 1 drop into the left eye 2 (two) times  daily.   No current facility-administered medications for this visit.  (Ophthalmic Drugs)   Current Outpatient Medications (Other)  Medication Sig  . aspirin EC 81 MG tablet Take 81 mg by mouth daily.    Marland Kitchen atorvastatin (LIPITOR) 20 MG tablet TAKE ONE TABLET BY MOUTH DAILY (Patient taking differently: Take 20 mg by mouth daily. )  . ciprofloxacin (CIPRO) 500 MG tablet Take 1 tablet (500 mg total) by mouth 2 (two) times daily.  . fluticasone (FLONASE ALLERGY RELIEF) 50 MCG/ACT nasal spray Place 2 sprays into both nostrils daily.   Marland Kitchen HYDROcodone-acetaminophen (NORCO/VICODIN) 5-325 MG tablet Take 1-2 tablets by mouth every 4 (four) hours as needed (pain).  Marland Kitchen levothyroxine (SYNTHROID, LEVOTHROID) 150 MCG tablet Take 150 mcg by mouth every other day.   . levothyroxine (SYNTHROID, LEVOTHROID) 175 MCG tablet Take 175 mcg by mouth every other day.   . metFORMIN (GLUCOPHAGE) 500 MG tablet Take 500 mg by mouth daily.   . metoprolol tartrate (LOPRESSOR) 25 MG tablet Take 25 mg by mouth 2 (two) times daily.    . mometasone (ELOCON) 0.1 % ointment Apply 1 application topically 2 (two) times daily as needed (for psorasis).  . naproxen sodium (ANAPROX) 220 MG tablet Take 440 mg by mouth daily as needed (for pain.).  Marland Kitchen nitroGLYCERIN (NITROSTAT) 0.4 MG SL tablet Place 1 tablet (  0.4 mg total) under the tongue every 5 (five) minutes as needed for chest pain.  . tamsulosin (FLOMAX) 0.4 MG CAPS capsule Take 1 capsule (0.4 mg total) by mouth daily after breakfast.  . telmisartan-hydrochlorothiazide (MICARDIS HCT) 80-25 MG per tablet Take 1 tablet by mouth daily.     No current facility-administered medications for this visit.  (Other)      REVIEW OF SYSTEMS: ROS    Positive for: Endocrine, Cardiovascular, Eyes   Negative for: Constitutional, Gastrointestinal, Neurological, Skin, Genitourinary, Musculoskeletal, HENT, Respiratory, Psychiatric, Allergic/Imm, Heme/Lymph   Last edited by Debbrah Alar, COT on  02/15/2019  8:56 AM. (History)       ALLERGIES Allergies  Allergen Reactions  . Azithromycin Swelling  . Shrimp [Shellfish Allergy] Other (See Comments)    Gout flares    PAST MEDICAL HISTORY Past Medical History:  Diagnosis Date  . Arthritis   . CAD (coronary artery disease)    Branch vessel and moderate RCA disease 2006  . Cancer ([place]) 1990   Melanoma Lower right Leg  . Carotid artery disease ([place])   . Diabetes ([place])   . Essential hypertension   . Hyperlipidemia   . Hypothyroidism   . Lyme disease   . PAD (peripheral artery disease) ([place])    Left common iliac stent 2004  . PONV (postoperative nausea and vomiting)    Past Surgical History:  Procedure Laterality Date  . CATARACT EXTRACTION Bilateral   . CATARACT EXTRACTION, BILATERAL    . CERVICAL DISC SURGERY    . COLONOSCOPY N/A 11/06/2016   Procedure: COLONOSCOPY;  Surgeon: Daneil Dolin, MD;  Location[facility];  Service: Endoscopy;  Laterality: N/A;  7:30 AM  . COLONOSCOPY  2012   Dr. Anthony Sar: normal. reviewed reports, which states he has a history of polyps in remote past.   . EYE SURGERY    . JOINT REPLACEMENT    . LUMBAR WOUND DEBRIDEMENT N/A 01/24/2019   Procedure: LUMBAR WOUND Exploration for Evacation of Seroma vs. Hematoma;  Surgeon: Jovita Gamma, MD;  Location: [place];  Service: Neurosurgery;  Laterality: N/A;  . MELANOMA EXCISION     right leg, [facility] at [facility] and underwent immunotherapy  . NECK SURGERY    . REPLACEMENT TOTAL KNEE      FAMILY HISTORY Family History  Problem Relation Age of Onset  . Heart disease Mother   . Heart attack Mother   . Colon polyps Mother   . Heart attack Maternal Grandmother   . Heart attack Maternal Grandfather   . Colon polyps Brother   . Colon cancer Neg Hx     SOCIAL HISTORY Social History   Tobacco Use  . Smoking status: Former Smoker    Packs/day: 1.50    Years: 40.00    Pack years: 60.00    Types: Cigarettes    Start date: 11/04/1963    Last  attempt to quit: 10/29/2003    Years since quitting: 15.3  . Smokeless tobacco: Never Used  Substance Use Topics  . Alcohol use: Yes    Alcohol/week: 0.0 standard drinks    Comment: one miller lite daily   . Drug use: No         OPHTHALMIC EXAM:  Base Eye Exam    Visual Acuity (Snellen - Linear)      Right Left   Dist cc 20/20 -1 20/20 -2   Dist ph cc NI NI   Correction:  Glasses  Tonometry (Tonopen, 9:07 AM)      Right Left   Pressure 16 22       Tonometry #2 (Tonopen, 9:07 AM)      Right Left   Pressure  20       Visual Fields (Counting fingers)      Left Right    Full Full       Extraocular Movement      Right Left    Full, Ortho Full, Ortho       Neuro/Psych    Oriented x3:  Yes   Mood/Affect:  Normal       Dilation    Both eyes:  1.0% Mydriacyl, 2.5% Phenylephrine @ 9:07 AM        Slit Lamp and Fundus Exam    External Exam      Right Left   External brow ptosis brow ptosis; mild periorbital edema       Slit Lamp Exam      Right Left   Lids/Lashes Dermatochalasis - upper lid, Telangiectasia, mild Meibomian gland dysfunction Dermatochalasis - upper lid, Meibomian gland dysfunction; mild periorbital/lid edema   Conjunctiva/Sclera superior and temporal Pinguecula nasal and temporal Pinguecula   Cornea mild arcus mild arcus, well healed cataract wounds   Anterior Chamber Deep and quiet, no cell/flare Deep, 1/2+ cell/pigment   Iris No NVI, Round and dilated No NVI, moderately dilated to 4, Transillumination defects from 0130-0200 with IOL haptic visible within   Lens Posterior chamber intraocular lens in good position Posterior chamber intraocular lens in good position   Vitreous vitreous syneresis Vitreous syneresis; no heme       Fundus Exam      Right Left   Disc Pink and Sharp Pink and Sharp   C/D Ratio 0.2 0.2   Macula Flat, mild RPE mottling, No heme or edema Flat, mild RPE mottling No heme or edema   Vessels normal, mildly Tortuous  mildly Tortuous   Periphery Attached, round focal area of CR atrophy at 0630 mid zone Attached          IMAGING AND PROCEDURES  Imaging and Procedures for @TODAY @  OCT, Retina - OU - Both Eyes       Right Eye Quality was good. Central Foveal Thickness: 279. Progression has been stable. Findings include normal foveal contour, no SRF, no IRF, vitreomacular adhesion .   Left Eye Quality was good. Central Foveal Thickness: 277. Progression has been stable. Findings include normal foveal contour, no IRF, no SRF, vitreomacular adhesion .   Notes *Images captured and stored on drive  Diagnosis / Impression:  NFP, no IRF, no SRF OU +VMA OU    Clinical management:  See below  Abbreviations: NFP - Normal foveal profile. CME - cystoid macular edema. PED - pigment epithelial detachment. IRF - intraretinal fluid. SRF - subretinal fluid. EZ - ellipsoid zone. ERM - epiretinal membrane. ORA - outer retinal atrophy. ORT - outer retinal tubulation. 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 Retinal edema H35.81 OCT, Retina - OU - Both Eyes  5. Pseudophakia of both eyes Z96.1   6. Lyme disease A69.20   7. Brow ptosis H57.819   8. Dermatochalasis of both upper eyelids  L93.790    H02.834     1-3. UGH Syndrome OS  - 3 piece PCIOL OS centered, but iris has large TID in sup temp quadrant with IOL haptic within area  - d/c'd Inveltys on 10/10/18 but had flare up on 11/29/18 and was restarted  - AC with 1/2+ cell/pigment today  - IOP OK today at 18 mmHg on cosopt and brimonidine BID OS  - gonio at previous visit shows open angles OS, mild PAS  - continue inveltys tid OS  - cont cosopt bid OS   - cont brimonidine bid OS   - f/u 3 months, sooner prn  4. No retinal edema on exam  or OCT  5. Pseudophakia OU  - s/p CE/IOL OU -- pt can't remember dates, one eye by [facility], other eye by [facility]  - likely UGH OS as above  - PCIOL OD appears to be in good position  - monitor  6. Lyme disease - recently diagnosed from target lesions on lower extremities - no blood test confirmation performed - completed doxycycline per PCP office - no manifestation in the eyes, but will continue to monitor  7,8. Brow ptosis, dermatochalasis OU -  refer to [facility] for eyelid eval (Dr. Vickki Muff)   Ophthalmic Meds Ordered this visit:  No orders of the defined types were placed in this encounter.      Return in about 3 months (around 05/16/2019) for f/u UGH syndrome , DFE, OCT.  There are no Patient Instructions on file for this visit.   Explained the diagnoses, plan, and follow up with the patient and they expressed understanding.  Patient expressed understanding of the importance of proper follow up care.   This document serves as a record of services personally performed by Gardiner Sleeper, MD, PhD. It was created on their behalf by Ernest Mallick, OA, an ophthalmic assistant. The creation of this record is the provider's dictation and/or activities during the visit.    Electronically signed by: Ernest Mallick, OA  02.18.2020 12:20 PM    Gardiner Sleeper, M.D., Ph.D. Diseases & Surgery of the Retina and Vitreous [facility] [place]  I have reviewed the above documentation for accuracy and completeness, and I agree with the above. Gardiner Sleeper, M.D., Ph.D. 02/15/19 12:20 PM     Abbreviations: M myopia (nearsighted); A astigmatism; H hyperopia (farsighted); P presbyopia; Mrx spectacle prescription;  CTL contact lenses; OD right eye; OS left eye; OU both eyes  XT exotropia; ET esotropia; PEK punctate epithelial keratitis; PEE punctate epithelial erosions; DES dry eye syndrome; MGD meibomian gland dysfunction; ATs artificial tears; PFAT's preservative free  artificial tears; [place] nuclear sclerotic cataract; PSC posterior subcapsular cataract; ERM epi-retinal membrane; PVD posterior vitreous detachment; RD retinal detachment; DM diabetes mellitus; DR diabetic retinopathy; NPDR non-proliferative diabetic retinopathy; PDR proliferative diabetic retinopathy; CSME clinically significant macular edema; DME diabetic macular edema; dbh dot blot hemorrhages; CWS cotton wool spot; POAG primary open angle glaucoma; C/D cup-to-disc ratio; HVF humphrey visual field; GVF goldmann visual field; OCT optical coherence tomography; IOP intraocular pressure; BRVO Branch retinal vein occlusion; CRVO central retinal vein occlusion; CRAO central retinal artery occlusion; BRAO branch retinal artery occlusion; RT retinal tear; SB scleral buckle; PPV pars plana vitrectomy; VH Vitreous hemorrhage; PRP panretinal laser photocoagulation; IVK intravitreal kenalog; VMT vitreomacular traction; MH Macular hole;  NVD neovascularization of the disc; NVE neovascularization elsewhere; AREDS age related eye disease study; ARMD age related macular degeneration; POAG primary open angle glaucoma;  EBMD epithelial/anterior basement membrane dystrophy; ACIOL anterior chamber intraocular lens; IOL intraocular lens; PCIOL posterior chamber intraocular lens; Phaco/IOL phacoemulsification with intraocular lens placement; [place] photorefractive keratectomy; LASIK laser assisted in situ keratomileusis; HTN hypertension; DM diabetes mellitus; COPD chronic obstructive pulmonary disease

## 2019-02-15 ENCOUNTER — Ambulatory Visit (INDEPENDENT_AMBULATORY_CARE_PROVIDER_SITE_OTHER): Payer: Medicare Other | Admitting: Ophthalmology

## 2019-02-15 ENCOUNTER — Encounter (INDEPENDENT_AMBULATORY_CARE_PROVIDER_SITE_OTHER): Payer: Self-pay | Admitting: Ophthalmology

## 2019-02-15 DIAGNOSIS — H02831 Dermatochalasis of right upper eyelid: Secondary | ICD-10-CM | POA: Diagnosis not present

## 2019-02-15 DIAGNOSIS — H02834 Dermatochalasis of left upper eyelid: Secondary | ICD-10-CM | POA: Diagnosis not present

## 2019-02-15 DIAGNOSIS — H57819 Brow ptosis, unspecified: Secondary | ICD-10-CM | POA: Diagnosis not present

## 2019-02-15 DIAGNOSIS — H4042X0 Glaucoma secondary to eye inflammation, left eye, stage unspecified: Secondary | ICD-10-CM

## 2019-02-15 DIAGNOSIS — H209 Unspecified iridocyclitis: Secondary | ICD-10-CM

## 2019-02-15 DIAGNOSIS — A6920 Lyme disease, unspecified: Secondary | ICD-10-CM | POA: Diagnosis not present

## 2019-02-15 DIAGNOSIS — Z961 Presence of intraocular lens: Secondary | ICD-10-CM | POA: Diagnosis not present

## 2019-02-15 DIAGNOSIS — H401321 Pigmentary glaucoma, left eye, mild stage: Secondary | ICD-10-CM

## 2019-02-15 DIAGNOSIS — H3581 Retinal edema: Secondary | ICD-10-CM

## 2019-02-15 DIAGNOSIS — T8579XA Infection and inflammatory reaction due to other internal prosthetic devices, implants and grafts, initial encounter: Secondary | ICD-10-CM

## 2019-02-16 ENCOUNTER — Ambulatory Visit: Payer: Medicare Other | Admitting: Cardiology

## 2019-02-16 DIAGNOSIS — R3912 Poor urinary stream: Secondary | ICD-10-CM | POA: Diagnosis not present

## 2019-02-16 DIAGNOSIS — R338 Other retention of urine: Secondary | ICD-10-CM | POA: Diagnosis not present

## 2019-02-17 DIAGNOSIS — Z9889 Other specified postprocedural states: Secondary | ICD-10-CM | POA: Diagnosis not present

## 2019-02-17 DIAGNOSIS — R531 Weakness: Secondary | ICD-10-CM | POA: Diagnosis not present

## 2019-02-17 DIAGNOSIS — R2689 Other abnormalities of gait and mobility: Secondary | ICD-10-CM | POA: Diagnosis not present

## 2019-02-17 DIAGNOSIS — R262 Difficulty in walking, not elsewhere classified: Secondary | ICD-10-CM | POA: Diagnosis not present

## 2019-02-20 DIAGNOSIS — Z87891 Personal history of nicotine dependence: Secondary | ICD-10-CM | POA: Diagnosis not present

## 2019-02-20 DIAGNOSIS — G9761 Postprocedural hematoma of a nervous system organ or structure following a nervous system procedure: Secondary | ICD-10-CM | POA: Diagnosis not present

## 2019-02-20 DIAGNOSIS — E782 Mixed hyperlipidemia: Secondary | ICD-10-CM | POA: Diagnosis not present

## 2019-02-20 DIAGNOSIS — E039 Hypothyroidism, unspecified: Secondary | ICD-10-CM | POA: Diagnosis not present

## 2019-02-20 DIAGNOSIS — I252 Old myocardial infarction: Secondary | ICD-10-CM | POA: Diagnosis not present

## 2019-02-20 DIAGNOSIS — I10 Essential (primary) hypertension: Secondary | ICD-10-CM | POA: Diagnosis not present

## 2019-02-20 DIAGNOSIS — J439 Emphysema, unspecified: Secondary | ICD-10-CM | POA: Diagnosis not present

## 2019-02-20 DIAGNOSIS — Z6829 Body mass index (BMI) 29.0-29.9, adult: Secondary | ICD-10-CM | POA: Diagnosis not present

## 2019-02-20 DIAGNOSIS — R2689 Other abnormalities of gait and mobility: Secondary | ICD-10-CM | POA: Diagnosis not present

## 2019-02-20 DIAGNOSIS — M48061 Spinal stenosis, lumbar region without neurogenic claudication: Secondary | ICD-10-CM | POA: Diagnosis not present

## 2019-02-20 DIAGNOSIS — I25119 Atherosclerotic heart disease of native coronary artery with unspecified angina pectoris: Secondary | ICD-10-CM | POA: Diagnosis not present

## 2019-02-20 DIAGNOSIS — E1151 Type 2 diabetes mellitus with diabetic peripheral angiopathy without gangrene: Secondary | ICD-10-CM | POA: Diagnosis not present

## 2019-02-20 DIAGNOSIS — R262 Difficulty in walking, not elsewhere classified: Secondary | ICD-10-CM | POA: Diagnosis not present

## 2019-02-20 DIAGNOSIS — Z9889 Other specified postprocedural states: Secondary | ICD-10-CM | POA: Diagnosis not present

## 2019-02-20 DIAGNOSIS — R531 Weakness: Secondary | ICD-10-CM | POA: Diagnosis not present

## 2019-02-22 DIAGNOSIS — R262 Difficulty in walking, not elsewhere classified: Secondary | ICD-10-CM | POA: Diagnosis not present

## 2019-02-22 DIAGNOSIS — Z9889 Other specified postprocedural states: Secondary | ICD-10-CM | POA: Diagnosis not present

## 2019-02-22 DIAGNOSIS — R531 Weakness: Secondary | ICD-10-CM | POA: Diagnosis not present

## 2019-02-22 DIAGNOSIS — R2689 Other abnormalities of gait and mobility: Secondary | ICD-10-CM | POA: Diagnosis not present

## 2019-02-24 DIAGNOSIS — R531 Weakness: Secondary | ICD-10-CM | POA: Diagnosis not present

## 2019-02-24 DIAGNOSIS — R2689 Other abnormalities of gait and mobility: Secondary | ICD-10-CM | POA: Diagnosis not present

## 2019-02-24 DIAGNOSIS — Z9889 Other specified postprocedural states: Secondary | ICD-10-CM | POA: Diagnosis not present

## 2019-02-24 DIAGNOSIS — R262 Difficulty in walking, not elsewhere classified: Secondary | ICD-10-CM | POA: Diagnosis not present

## 2019-02-27 DIAGNOSIS — R262 Difficulty in walking, not elsewhere classified: Secondary | ICD-10-CM | POA: Diagnosis not present

## 2019-02-27 DIAGNOSIS — R2689 Other abnormalities of gait and mobility: Secondary | ICD-10-CM | POA: Diagnosis not present

## 2019-02-27 DIAGNOSIS — Z9889 Other specified postprocedural states: Secondary | ICD-10-CM | POA: Diagnosis not present

## 2019-02-27 DIAGNOSIS — R531 Weakness: Secondary | ICD-10-CM | POA: Diagnosis not present

## 2019-03-01 DIAGNOSIS — R2689 Other abnormalities of gait and mobility: Secondary | ICD-10-CM | POA: Diagnosis not present

## 2019-03-01 DIAGNOSIS — R531 Weakness: Secondary | ICD-10-CM | POA: Diagnosis not present

## 2019-03-01 DIAGNOSIS — Z9889 Other specified postprocedural states: Secondary | ICD-10-CM | POA: Diagnosis not present

## 2019-03-01 DIAGNOSIS — R262 Difficulty in walking, not elsewhere classified: Secondary | ICD-10-CM | POA: Diagnosis not present

## 2019-03-03 DIAGNOSIS — R262 Difficulty in walking, not elsewhere classified: Secondary | ICD-10-CM | POA: Diagnosis not present

## 2019-03-03 DIAGNOSIS — Z9889 Other specified postprocedural states: Secondary | ICD-10-CM | POA: Diagnosis not present

## 2019-03-03 DIAGNOSIS — R2689 Other abnormalities of gait and mobility: Secondary | ICD-10-CM | POA: Diagnosis not present

## 2019-03-03 DIAGNOSIS — R531 Weakness: Secondary | ICD-10-CM | POA: Diagnosis not present

## 2019-03-06 DIAGNOSIS — R531 Weakness: Secondary | ICD-10-CM | POA: Diagnosis not present

## 2019-03-06 DIAGNOSIS — R262 Difficulty in walking, not elsewhere classified: Secondary | ICD-10-CM | POA: Diagnosis not present

## 2019-03-06 DIAGNOSIS — Z9889 Other specified postprocedural states: Secondary | ICD-10-CM | POA: Diagnosis not present

## 2019-03-06 DIAGNOSIS — R2689 Other abnormalities of gait and mobility: Secondary | ICD-10-CM | POA: Diagnosis not present

## 2019-03-08 DIAGNOSIS — R2689 Other abnormalities of gait and mobility: Secondary | ICD-10-CM | POA: Diagnosis not present

## 2019-03-08 DIAGNOSIS — R262 Difficulty in walking, not elsewhere classified: Secondary | ICD-10-CM | POA: Diagnosis not present

## 2019-03-08 DIAGNOSIS — Z9889 Other specified postprocedural states: Secondary | ICD-10-CM | POA: Diagnosis not present

## 2019-03-08 DIAGNOSIS — R531 Weakness: Secondary | ICD-10-CM | POA: Diagnosis not present

## 2019-03-10 ENCOUNTER — Other Ambulatory Visit: Payer: Self-pay

## 2019-03-10 ENCOUNTER — Encounter: Payer: Self-pay | Admitting: Cardiology

## 2019-03-10 ENCOUNTER — Ambulatory Visit (INDEPENDENT_AMBULATORY_CARE_PROVIDER_SITE_OTHER): Payer: Medicare Other | Admitting: Cardiology

## 2019-03-10 VITALS — BP 113/64 | HR 62 | Ht 68.0 in | Wt 183.4 lb

## 2019-03-10 DIAGNOSIS — E782 Mixed hyperlipidemia: Secondary | ICD-10-CM

## 2019-03-10 DIAGNOSIS — I10 Essential (primary) hypertension: Secondary | ICD-10-CM

## 2019-03-10 DIAGNOSIS — I25119 Atherosclerotic heart disease of native coronary artery with unspecified angina pectoris: Secondary | ICD-10-CM

## 2019-03-10 DIAGNOSIS — I6523 Occlusion and stenosis of bilateral carotid arteries: Secondary | ICD-10-CM

## 2019-03-10 NOTE — Progress Notes (Signed)
Cardiology Office Note  Date: 03/10/2019   ID: Christopher Randolph, DOB 20-Jan-1946, MRN [id number]  PCP: Curlene Labrum, MD  Primary Cardiologist: Rozann Lesches, MD   Chief Complaint  Patient presents with  . Coronary Artery Disease    History of Present Illness: Christopher Randolph is a 73 y.o. male last seen in August 2019.  He is here for a routine follow-up visit.  I reviewed his interval history including lumbar surgery back in January.  He is still working with outpatient physical therapy, wearing a lumbar brace.  From a cardiac perspective, he reports no angina symptoms or worsening shortness of breath.  I reviewed his medications.  Cardiac regimen includes aspirin, Lipitor, Lopressor, and myocarditis.  He states that his systolics have generally been around 140 at home.  He has had issues with lower blood pressure previously necessitating reduction in therapy.  Follow-up carotid Dopplers from August 2019 revealed 60 to 79% bilateral ICA stenoses.  He is asymptomatic and will have a follow-up study later this year.  I reviewed his interval ECG in January.  Past Medical History:  Diagnosis Date  . Arthritis   . CAD (coronary artery disease)    Branch vessel and moderate RCA disease 2006  . Cancer ([place]) 1990   Melanoma Lower right Leg  . Carotid artery disease ([place])   . Diabetes ([place])   . Essential hypertension   . Hyperlipidemia   . Hypothyroidism   . Lyme disease   . PAD (peripheral artery disease) ([place])    Left common iliac stent 2004  . PONV (postoperative nausea and vomiting)     Past Surgical History:  Procedure Laterality Date  . CATARACT EXTRACTION Bilateral   . CATARACT EXTRACTION, BILATERAL    . CERVICAL DISC SURGERY    . COLONOSCOPY N/A 11/06/2016   Procedure: COLONOSCOPY;  Surgeon: Daneil Dolin, MD;  [facility];  Service: Endoscopy;  Laterality: N/A;  7:30 AM  . COLONOSCOPY  2012   Dr. Anthony Sar: normal. reviewed reports, which states  he has a history of polyps in remote past.   . EYE SURGERY    . JOINT REPLACEMENT    . LUMBAR WOUND DEBRIDEMENT N/A 01/24/2019   Procedure: LUMBAR WOUND Exploration for Evacation of Seroma vs. Hematoma;  Surgeon: Jovita Gamma, MD;  Location: [facility];  Service: Neurosurgery;  Laterality: N/A;  . MELANOMA EXCISION     right leg, seen at [facility] and underwent immunotherapy  . NECK SURGERY    . REPLACEMENT TOTAL KNEE      Current Outpatient Medications  Medication Sig Dispense Refill  . aspirin EC 81 MG tablet Take 81 mg by mouth daily.      Marland Kitchen atorvastatin (LIPITOR) 20 MG tablet TAKE ONE TABLET BY MOUTH DAILY (Patient taking differently: Take 20 mg by mouth daily. ) 90 tablet 3  . brimonidine (ALPHAGAN) 0.2 % ophthalmic solution Place 1 drop into the left eye 2 (two) times daily. 10 mL 3  . dorzolamide-timolol (COSOPT) 22.3-6.8 MG/ML ophthalmic solution Place 1 drop into the left eye 2 (two) times daily. 10 mL 3  . fluticasone (FLONASE ALLERGY RELIEF) 50 MCG/ACT nasal spray Place 2 sprays into both nostrils daily.     Marland Kitchen levothyroxine (SYNTHROID, LEVOTHROID) 150 MCG tablet Take 150 mcg by mouth every other day.   1  . levothyroxine (SYNTHROID, LEVOTHROID) 175 MCG tablet Take 175 mcg by mouth every other day.     . Loteprednol Etabonate (INVELTYS) 1 %  SUSP Place 1 drop into the left eye 2 (two) times daily. 2.8 mL 2  . metFORMIN (GLUCOPHAGE) 500 MG tablet Take 500 mg by mouth daily.     . metoprolol tartrate (LOPRESSOR) 25 MG tablet Take 25 mg by mouth 2 (two) times daily.      . mometasone (ELOCON) 0.1 % ointment Apply 1 application topically 2 (two) times daily as needed (for psorasis).    . naproxen sodium (ANAPROX) 220 MG tablet Take 440 mg by mouth daily as needed (for pain.).    Marland Kitchen nitroGLYCERIN (NITROSTAT) 0.4 MG SL tablet Place 1 tablet (0.4 mg total) under the tongue every 5 (five) minutes as needed for chest pain. 25 tablet 3  . tamsulosin (FLOMAX) 0.4 MG CAPS capsule Take 0.4 mg by mouth  2 (two) times daily.    Marland Kitchen telmisartan-hydrochlorothiazide (MICARDIS HCT) 80-25 MG per tablet Take 1 tablet by mouth daily.       No current facility-administered medications for this visit.    Allergies:  Azithromycin and Shrimp [shellfish allergy]   Social History: The patient  reports that he quit smoking about 15 years ago. His smoking use included cigarettes. He started smoking about 55 years ago. He has a 60.00 pack-year smoking history. He has never used smokeless tobacco. He reports current alcohol use. He reports that he does not use drugs.   ROS:  Please see the history of present illness. Otherwise, complete review of systems is positive for improving leg strength, still has some peripheral neuropathy.  All other systems are reviewed and negative.   Physical Exam: VS:  BP 113/64   Pulse 62   Ht 5\' 8"  (1.727 m)   Wt 183 lb 6.4 oz (83.2 kg)   SpO2 97%   BMI 27.89 kg/m , BMI Body mass index is 27.89 kg/m.  Wt Readings from Last 3 Encounters:  03/10/19 183 lb 6.4 oz (83.2 kg)  01/24/19 184 lb 10.9 oz (83.8 kg)  01/18/19 184 lb 10.9 oz (83.8 kg)    General: Patient appears comfortable at rest. HEENT: Conjunctiva and lids normal, oropharynx clear. Neck: Supple, no elevated JVP or carotid bruits, no thyromegaly. Lungs: Clear to auscultation, nonlabored breathing at rest. Cardiac: Regular rate and rhythm, no S3, 2/6 systolic murmur. Abdomen: Lower abdominal/lumbar brace. Extremities: No pitting edema, distal pulses 2+. Skin: Warm and dry. Musculoskeletal: No kyphosis. Neuropsychiatric: Alert and oriented x3, affect grossly appropriate.  ECG: I personally reviewed the tracing 01/11/2019 which showed sinus bradycardia with lead motion artifact and nonspecific T wave changes.  Recent Labwork: 01/24/2019: BUN 22; Creatinine, Ser 0.94; Hemoglobin 9.8; Platelets 151; Potassium 4.0; Sodium 136   Other Studies Reviewed Today:  Exercise Cardiolite 08/22/2014: IMPRESSION: 1. No  reversible ischemia or infarction. Fixed inferior wall defect likely due to soft tissue attenuation given normal regional wall motion.  2. Normal left ventricular wall motion.  3. Left ventricular ejection fraction 80%  4. Low-risk stress test findings*.  Carotid Dopplers 08/25/2018: Final Interpretation: Right Carotid: Velocities in the right ICA are consistent with a 60-79%                stenosis.  Left Carotid: Velocities in the left ICA are consistent with a 60-79% stenosis.  Vertebrals:  Bilateral vertebral arteries demonstrate antegrade flow. Subclavians: Normal flow hemodynamics were seen in bilateral subclavian              arteries.  Assessment and Plan:  1.  CAD, predominantly branch vessel and also moderate  RCA stenosis that has been managed medically.  He is stable without active angina symptoms.  Continue aspirin and statin.  2.  Essential hypertension.  Blood pressure is well controlled today.  No change in current regimen which includes Lopressor and Micardis HCT.  3.  Moderate bilateral carotid artery disease.  He is asymptomatic.  Continue aspirin and statin.  Follow-up repeat Dopplers later this year.  4.  Mixed hyperlipidemia, he continues on Lipitor with follow-up by Dr. Pleas Koch.  Current medicines were reviewed with the patient today.  Disposition: Follow-up in 6 months.  Signed, Satira Sark, MD, [facility] 03/10/2019 1:21 PM    [facility] [place], [place],  [address] Phone: [phone]; Fax: [phone]

## 2019-03-10 NOTE — Patient Instructions (Addendum)

## 2019-03-13 DIAGNOSIS — R531 Weakness: Secondary | ICD-10-CM | POA: Diagnosis not present

## 2019-03-13 DIAGNOSIS — M48061 Spinal stenosis, lumbar region without neurogenic claudication: Secondary | ICD-10-CM | POA: Diagnosis not present

## 2019-03-13 DIAGNOSIS — Z9889 Other specified postprocedural states: Secondary | ICD-10-CM | POA: Diagnosis not present

## 2019-03-13 DIAGNOSIS — R2689 Other abnormalities of gait and mobility: Secondary | ICD-10-CM | POA: Diagnosis not present

## 2019-03-13 DIAGNOSIS — I10 Essential (primary) hypertension: Secondary | ICD-10-CM | POA: Diagnosis not present

## 2019-03-13 DIAGNOSIS — E1151 Type 2 diabetes mellitus with diabetic peripheral angiopathy without gangrene: Secondary | ICD-10-CM | POA: Diagnosis not present

## 2019-03-13 DIAGNOSIS — I25119 Atherosclerotic heart disease of native coronary artery with unspecified angina pectoris: Secondary | ICD-10-CM | POA: Diagnosis not present

## 2019-03-13 DIAGNOSIS — E039 Hypothyroidism, unspecified: Secondary | ICD-10-CM | POA: Diagnosis not present

## 2019-03-13 DIAGNOSIS — E782 Mixed hyperlipidemia: Secondary | ICD-10-CM | POA: Diagnosis not present

## 2019-03-13 DIAGNOSIS — R262 Difficulty in walking, not elsewhere classified: Secondary | ICD-10-CM | POA: Diagnosis not present

## 2019-03-13 DIAGNOSIS — I252 Old myocardial infarction: Secondary | ICD-10-CM | POA: Diagnosis not present

## 2019-03-13 DIAGNOSIS — Z87891 Personal history of nicotine dependence: Secondary | ICD-10-CM | POA: Diagnosis not present

## 2019-03-17 DIAGNOSIS — Z9889 Other specified postprocedural states: Secondary | ICD-10-CM | POA: Diagnosis not present

## 2019-03-17 DIAGNOSIS — R262 Difficulty in walking, not elsewhere classified: Secondary | ICD-10-CM | POA: Diagnosis not present

## 2019-03-17 DIAGNOSIS — R531 Weakness: Secondary | ICD-10-CM | POA: Diagnosis not present

## 2019-03-17 DIAGNOSIS — R2689 Other abnormalities of gait and mobility: Secondary | ICD-10-CM | POA: Diagnosis not present

## 2019-04-08 DIAGNOSIS — M10072 Idiopathic gout, left ankle and foot: Secondary | ICD-10-CM | POA: Diagnosis not present

## 2019-04-08 DIAGNOSIS — Z6829 Body mass index (BMI) 29.0-29.9, adult: Secondary | ICD-10-CM | POA: Diagnosis not present

## 2019-04-19 DIAGNOSIS — L409 Psoriasis, unspecified: Secondary | ICD-10-CM | POA: Diagnosis not present

## 2019-04-19 DIAGNOSIS — L570 Actinic keratosis: Secondary | ICD-10-CM | POA: Diagnosis not present

## 2019-04-19 DIAGNOSIS — Z85820 Personal history of malignant melanoma of skin: Secondary | ICD-10-CM | POA: Diagnosis not present

## 2019-04-25 DIAGNOSIS — E039 Hypothyroidism, unspecified: Secondary | ICD-10-CM | POA: Diagnosis not present

## 2019-05-10 DIAGNOSIS — E782 Mixed hyperlipidemia: Secondary | ICD-10-CM | POA: Diagnosis not present

## 2019-05-10 DIAGNOSIS — E119 Type 2 diabetes mellitus without complications: Secondary | ICD-10-CM | POA: Diagnosis not present

## 2019-05-10 DIAGNOSIS — I10 Essential (primary) hypertension: Secondary | ICD-10-CM | POA: Diagnosis not present

## 2019-05-10 DIAGNOSIS — E039 Hypothyroidism, unspecified: Secondary | ICD-10-CM | POA: Diagnosis not present

## 2019-05-12 DIAGNOSIS — Z6829 Body mass index (BMI) 29.0-29.9, adult: Secondary | ICD-10-CM | POA: Diagnosis not present

## 2019-05-12 DIAGNOSIS — Z87891 Personal history of nicotine dependence: Secondary | ICD-10-CM | POA: Diagnosis not present

## 2019-05-12 DIAGNOSIS — I25119 Atherosclerotic heart disease of native coronary artery with unspecified angina pectoris: Secondary | ICD-10-CM | POA: Diagnosis not present

## 2019-05-12 DIAGNOSIS — E1151 Type 2 diabetes mellitus with diabetic peripheral angiopathy without gangrene: Secondary | ICD-10-CM | POA: Diagnosis not present

## 2019-05-12 DIAGNOSIS — E039 Hypothyroidism, unspecified: Secondary | ICD-10-CM | POA: Diagnosis not present

## 2019-05-12 DIAGNOSIS — M48061 Spinal stenosis, lumbar region without neurogenic claudication: Secondary | ICD-10-CM | POA: Diagnosis not present

## 2019-05-12 DIAGNOSIS — I10 Essential (primary) hypertension: Secondary | ICD-10-CM | POA: Diagnosis not present

## 2019-05-12 DIAGNOSIS — M19072 Primary osteoarthritis, left ankle and foot: Secondary | ICD-10-CM | POA: Diagnosis not present

## 2019-05-15 NOTE — Progress Notes (Signed)
[facility] Note  05/16/2019     CHIEF COMPLAINT Patient presents for Retina Follow Up   HISTORY OF PRESENT ILLNESS: Christopher Randolph is a 73 y.o. 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  Severity is moderate.  Duration of 3 months.  Since onset it is stable.  I, the attending physician,  performed the HPI with the patient and updated documentation appropriately.          Comments    Patient states vision the same OU. BS was 151 this am. Last a1c was 7.3 last week. Using inveltys bid, cosopt bid, and brimonidine bid OS.        Last edited by Christopher Caffey, MD on 05/17/2019 11:47 AM. (History)    Patient states he is doing well, he states he had a couple flare ups that lasted less than an hour, he has noticed a couple floaters in his right eye  Referring physician: Curlene Labrum, MD [place], [place] [id number]  HISTORICAL INFORMATION:   Selected notes from the MEDICAL RECORD NUMBER Referred by Dr. Vicie Randolph for concern of vitreous hemorrhage LEE: 05.21.19 Christopher Randolph) [BCVA: OD: OS: ] Ocular Hx-Psedophakia OU,  PMH-DM (on Metformin), Arthritis     CURRENT MEDICATIONS: Current Outpatient Medications (Ophthalmic Drugs)  Medication Sig  . brimonidine (ALPHAGAN) 0.2 % ophthalmic solution Place 1 drop into the left eye 2 (two) times daily.  . dorzolamide-timolol (COSOPT) 22.3-6.8 MG/ML ophthalmic solution Place 1 drop into the left eye 2 (two) times daily.  . Loteprednol Etabonate (INVELTYS) 1 % SUSP Place 1 drop into the left eye 2 (two) times daily. (Patient taking differently: Place 1 drop into the left eye 2 (two) times a day. )   No current facility-administered medications for this visit.  (Ophthalmic Drugs)   Current Outpatient Medications (Other)  Medication Sig  . aspirin EC 81 MG tablet Take 81 mg by mouth daily.    Marland Kitchen atorvastatin  (LIPITOR) 20 MG tablet TAKE ONE TABLET BY MOUTH DAILY (Patient taking differently: Take 20 mg by mouth daily. )  . fluticasone (FLONASE ALLERGY RELIEF) 50 MCG/ACT nasal spray Place 2 sprays into both nostrils daily.   Marland Kitchen levothyroxine (SYNTHROID, LEVOTHROID) 150 MCG tablet Take 150 mcg by mouth every other day.   . levothyroxine (SYNTHROID, LEVOTHROID) 175 MCG tablet Take 175 mcg by mouth every other day.   . metFORMIN (GLUCOPHAGE) 500 MG tablet Take 500 mg by mouth daily.   . metoprolol tartrate (LOPRESSOR) 25 MG tablet Take 25 mg by mouth 2 (two) times daily.    . mometasone (ELOCON) 0.1 % ointment Apply 1 application topically 2 (two) times daily as needed (for psorasis).  . naproxen sodium (ANAPROX) 220 MG tablet Take 440 mg by mouth daily as needed (for pain.).  Marland Kitchen nitroGLYCERIN (NITROSTAT) 0.4 MG SL tablet Place 1 tablet (0.4 mg total) under the tongue every 5 (five) minutes as needed for chest pain.  . tamsulosin (FLOMAX) 0.4 MG CAPS capsule Take 0.4 mg by mouth 2 (two) times daily.  Marland Kitchen telmisartan-hydrochlorothiazide (MICARDIS HCT) 80-25 MG per tablet Take 1 tablet by mouth daily.     No current facility-administered medications for this visit.  (Other)      REVIEW OF SYSTEMS: ROS    Positive for: Endocrine, Cardiovascular, Eyes   Negative for:  Constitutional, Gastrointestinal, Neurological, Skin, Genitourinary, Musculoskeletal, HENT, Respiratory, Psychiatric, Allergic/Imm, Heme/Lymph   Last edited by Christopher Randolph on 05/16/2019  9:02 AM. (History)       ALLERGIES Allergies  Allergen Reactions  . Azithromycin Swelling  . Shrimp [Shellfish Allergy] Other (See Comments)    Gout flares    PAST MEDICAL HISTORY Past Medical History:  Diagnosis Date  . Arthritis   . CAD (coronary artery disease)    Branch vessel and moderate RCA disease 2006  . Cancer ([place]) 1990   Melanoma Lower right Leg  . Carotid artery disease ([place])   . Diabetes ([place])   . Essential hypertension   .  Hyperlipidemia   . Hypothyroidism   . Lyme disease   . PAD (peripheral artery disease) ([place])    Left common iliac stent 2004  . PONV (postoperative nausea and vomiting)    Past Surgical History:  Procedure Laterality Date  . CATARACT EXTRACTION Bilateral   . CATARACT EXTRACTION, BILATERAL    . CERVICAL DISC SURGERY    . COLONOSCOPY N/A 11/06/2016   Procedure: COLONOSCOPY;  Surgeon: Christopher Dolin, MD;  [facility];  Service: Endoscopy;  Laterality: N/A;  7:30 AM  . COLONOSCOPY  2012   Dr. Anthony Randolph: normal. reviewed reports, which states he has a history of polyps in remote past.   . EYE SURGERY    . JOINT REPLACEMENT    . LUMBAR WOUND DEBRIDEMENT N/A 01/24/2019   Procedure: LUMBAR WOUND Exploration for Evacation of Seroma vs. Hematoma;  Surgeon: Christopher Gamma, MD;  Location: [facility];  Service: Neurosurgery;  Laterality: N/A;  . MELANOMA EXCISION     right leg, [facility] and underwent immunotherapy  . NECK SURGERY    . REPLACEMENT TOTAL KNEE      FAMILY HISTORY Family History  Problem Relation Age of Onset  . Heart disease Mother   . Heart attack Mother   . Colon polyps Mother   . Heart attack Maternal Grandmother   . Heart attack Maternal Grandfather   . Colon polyps Brother   . Colon cancer Neg Hx     SOCIAL HISTORY Social History   Tobacco Use  . Smoking status: Former Smoker    Packs/day: 1.50    Years: 40.00    Pack years: 60.00    Types: Cigarettes    Start date: 11/04/1963    Last attempt to quit: 10/29/2003    Years since quitting: 15.5  . Smokeless tobacco: Never Used  Substance Use Topics  . Alcohol use: Yes    Alcohol/week: 0.0 standard drinks    Comment: one miller lite daily   . Drug use: No         OPHTHALMIC EXAM:  Base Eye Exam    Visual Acuity (Snellen - Linear)      Right Left   Dist cc 20/20 -1 20/20 -1   Correction:  Glasses       Tonometry (Tonopen, 9:12 AM)      Right Left   Pressure 21 20       Pupils       Dark Light Shape React APD   Right 3 2 Round Brisk None   Left 3 2 Round Brisk None       Visual Fields (Counting fingers)      Left Right    Full Full       Extraocular Movement      Right Left    Full, Ortho  Full, Ortho       Neuro/Psych    Oriented x3:  Yes   Mood/Affect:  Normal       Dilation    Both eyes:  1.0% Mydriacyl, 2.5% Phenylephrine @ 9:12 AM        Slit Lamp and Fundus Exam    External Exam      Right Left   External brow ptosis brow ptosis; mild periorbital edema       Slit Lamp Exam      Right Left   Lids/Lashes Dermatochalasis - upper lid, Telangiectasia, mild Meibomian gland dysfunction Dermatochalasis - upper lid, Meibomian gland dysfunction; mild periorbital/lid edema, Telangiectasia   Conjunctiva/Sclera superior and temporal Pinguecula nasal and temporal Pinguecula, mild Conjunctivochalasis   Cornea mild arcus, 1+ Punctate epithelial erosions mild arcus, well healed cataract wounds, 1+ Punctate epithelial erosions   Anterior Chamber Deep and quiet, no cell/flare Deep and quiet, no cell   Iris No NVI, Round and dilated No NVI, moderately dilated to 4, Transillumination defects from 0130-0200 with IOL haptic visible within - stable   Lens Posterior chamber intraocular lens in good position Posterior chamber intraocular lens in good position   Vitreous vitreous syneresis Vitreous syneresis; no heme       Fundus Exam      Right Left   Disc Pink and Sharp Pink and Sharp   C/Randolph Ratio 0.2 0.3   Macula Flat,  mild RPE mottling, No heme or edema Flat, mild RPE mottling, No heme or edema   Vessels normal, mildly Tortuous mildly Tortuous   Periphery Attached, round focal area of CR atrophy at 0630 mid zone Attached        Refraction    Wearing Rx      Sphere Cylinder Axis   Right +0.50 Sphere    Left -0.25 +0.25 037   Type:  Trifocal          IMAGING AND PROCEDURES  Imaging and Procedures for @TODAY @  OCT, Retina - OU - Both Eyes        Right Eye Quality was good. Central Foveal Thickness: 280. Progression has been stable. Findings include normal foveal contour, no SRF, no IRF (Interval release of VMA; partial PVD).   Left Eye Quality was good. Central Foveal Thickness: 283. Progression has been stable. Findings include normal foveal contour, no IRF, no SRF, vitreomacular adhesion  (VMA progressing to partial PVD).   Notes *Images captured and stored on drive  Diagnosis / Impression:  NFP, no IRF, no SRF OU +VMA OS    Clinical management:  See below  Abbreviations: NFP - Normal foveal profile. CME - cystoid macular edema. PED - pigment epithelial detachment. IRF - intraretinal fluid. SRF - subretinal fluid. EZ - ellipsoid zone. ERM - epiretinal membrane. ORA - outer retinal atrophy. ORT - outer retinal tubulation. 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 Retinal edema H35.81 OCT, Retina - OU - Both Eyes  5. Pseudophakia of both eyes Z96.1   6. Lyme disease A69.20   7. Brow ptosis H57.819   8. 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 UGH Syndrome OS  - 3 piece PCIOL OS  centered, but iris has large TID in sup temp quadrant with IOL haptic within area  - Randolph/c'Randolph Inveltys on 10/10/18 but had flare up on 11/29/18 and was restarted  - currently on Inveltys, Cosopt, and brimonidine BID OS  - AC with no cell/pigment today and no subjective flare ups since last visit  - IOP OK today at 20 mmHg on cosopt and brimonidine BID OS  - gonio at previous visit shows open angles OS, mild PAS  - continue inveltys bid OS -- will likely keep this on long term  - cont cosopt bid OS   - cont brimonidine bid OS   - f/u 3-4 months, sooner prn  4. No retinal edema on exam or OCT  5.  Pseudophakia OU  - s/p CE/IOL OU -- pt can't remember dates, one eye by [facility], other eye by [facility]  - likely UGH OS as above  - PCIOL OD appears to be in good position  - monitor  6. Lyme disease  - recently diagnosed from target lesions on lower extremities  - no blood test confirmation performed  - completed doxycycline per PCP office  - no manifestation in the eyes, but will continue to monitor  7,8. Brow ptosis, dermatochalasis OU  -  referred to [facility] for eyelid eval (Dr. Vickki Muff)   Ophthalmic Meds Ordered this visit:  No orders of the defined types were placed in this encounter.      Return for f/u 3-4 months UGH syndrome OS.  There are no Patient Instructions on file for this visit.   Explained the diagnoses, plan, and follow up with the patient and they expressed understanding.  Patient expressed understanding of the importance of proper follow up care.   This document serves as a record of services personally performed by Gardiner Sleeper, MD, PhD. It was created on their behalf by Ernest Mallick, OA, an ophthalmic assistant. The creation of this record is the provider's dictation and/or activities during the visit.    Electronically signed by: Ernest Mallick, OA  05.18.2020 11:47 AM    Gardiner Sleeper, M.Randolph., Ph.Randolph. Diseases [facility] Surgery of the Retina and Vitreous [facility]  I have reviewed the above documentation for accuracy and completeness, and I agree with the above. Gardiner Sleeper, M.Randolph., Ph.Randolph. 05/17/19 11:49 AM   Abbreviations: M myopia (nearsighted); A astigmatism; H hyperopia (farsighted); P presbyopia; Mrx spectacle prescription;  CTL contact lenses; OD right eye; OS left eye; OU both eyes  XT exotropia; ET esotropia; PEK punctate epithelial keratitis; PEE punctate epithelial erosions; DES dry eye syndrome; MGD meibomian gland dysfunction; ATs artificial tears; PFAT's preservative free artificial tears; [place] nuclear sclerotic  cataract; PSC posterior subcapsular cataract; ERM epi-retinal membrane; PVD posterior vitreous detachment; RD retinal detachment; DM diabetes mellitus; DR diabetic retinopathy; NPDR non-proliferative diabetic retinopathy; PDR proliferative diabetic retinopathy; CSME clinically significant macular edema; DME diabetic macular edema; dbh dot blot hemorrhages; CWS cotton wool spot; POAG primary open angle glaucoma; C/Randolph cup-to-disc ratio; HVF humphrey visual field; GVF goldmann visual field; OCT optical coherence tomography; IOP intraocular pressure; BRVO Branch retinal vein occlusion; CRVO central retinal vein occlusion; CRAO central retinal artery occlusion; BRAO branch retinal artery occlusion; RT retinal tear; SB scleral buckle; PPV pars plana vitrectomy; VH Vitreous hemorrhage; PRP panretinal laser photocoagulation; IVK intravitreal kenalog; VMT vitreomacular traction; MH Macular hole;  NVD neovascularization of the disc; NVE neovascularization elsewhere; AREDS age related eye disease study; ARMD age related macular degeneration; POAG  primary open angle glaucoma; EBMD epithelial/anterior basement membrane dystrophy; ACIOL anterior chamber intraocular lens; IOL intraocular lens; PCIOL posterior chamber intraocular lens; Phaco/IOL phacoemulsification with intraocular lens placement; [place] photorefractive keratectomy; LASIK laser assisted in situ keratomileusis; HTN hypertension; DM diabetes mellitus; COPD chronic obstructive pulmonary disease

## 2019-05-16 ENCOUNTER — Other Ambulatory Visit: Payer: Self-pay

## 2019-05-16 ENCOUNTER — Ambulatory Visit (INDEPENDENT_AMBULATORY_CARE_PROVIDER_SITE_OTHER): Payer: Medicare Other | Admitting: Ophthalmology

## 2019-05-16 ENCOUNTER — Encounter (INDEPENDENT_AMBULATORY_CARE_PROVIDER_SITE_OTHER): Payer: Self-pay | Admitting: Ophthalmology

## 2019-05-16 DIAGNOSIS — A6920 Lyme disease, unspecified: Secondary | ICD-10-CM

## 2019-05-16 DIAGNOSIS — H3581 Retinal edema: Secondary | ICD-10-CM

## 2019-05-16 DIAGNOSIS — H401321 Pigmentary glaucoma, left eye, mild stage: Secondary | ICD-10-CM | POA: Diagnosis not present

## 2019-05-16 DIAGNOSIS — H02831 Dermatochalasis of right upper eyelid: Secondary | ICD-10-CM

## 2019-05-16 DIAGNOSIS — Z961 Presence of intraocular lens: Secondary | ICD-10-CM | POA: Diagnosis not present

## 2019-05-16 DIAGNOSIS — H209 Unspecified iridocyclitis: Secondary | ICD-10-CM

## 2019-05-16 DIAGNOSIS — H02834 Dermatochalasis of left upper eyelid: Secondary | ICD-10-CM | POA: Diagnosis not present

## 2019-05-16 DIAGNOSIS — H57819 Brow ptosis, unspecified: Secondary | ICD-10-CM

## 2019-05-17 ENCOUNTER — Encounter (INDEPENDENT_AMBULATORY_CARE_PROVIDER_SITE_OTHER): Payer: Self-pay | Admitting: Ophthalmology

## 2019-05-18 DIAGNOSIS — R3912 Poor urinary stream: Secondary | ICD-10-CM | POA: Diagnosis not present

## 2019-07-03 NOTE — Progress Notes (Signed)
[facility] Note  07/04/2019     CHIEF COMPLAINT Patient presents for Retina Follow Up   HISTORY OF PRESENT ILLNESS: Christopher Randolph is a 73 y.o. 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  Severity is moderate.  Duration of 7 weeks.  Since onset it is gradually worsening.  I, the attending physician,  performed the HPI with the patient and updated documentation appropriately.          Comments    Patient states during the past week, OS vision has fluctuated. OS sensitive to light. Still taking cosopt, brimonidine, and iveltys bid OS per Dr. Coralyn Pear. Last used all 3 drops this am. BS was 160 this am. Last a1c was 7.3, checked around 2 months ago. Noticed blurry spot in central vision OS, upon examination today.        Last edited by [name], MD on 07/04/2019  3:08 PM. (History)    Patient states his left eye is getting foggy/hazy intermittently, pt is taking inveltys, brimonidine and cosopt BID OS, pt states he noticed a grey spot in his central vision while reading the eye chart today, he states there is no change with the right eye  Referring physician: Curlene Labrum, MD [place],  [place] [id number]  HISTORICAL INFORMATION:   Selected notes from the MEDICAL RECORD NUMBER Referred by Dr. [name] for concern of vitreous hemorrhage LEE: 05.21.19 [name]) [BCVA: OD: OS: ] Ocular Hx-Psedophakia OU,  PMH-DM (on Metformin), Arthritis     CURRENT MEDICATIONS: Current Outpatient Medications (Ophthalmic Drugs)  Medication Sig  . brimonidine (ALPHAGAN) 0.2 % ophthalmic solution Place 1 drop into the left eye 2 (two) times daily.  . dorzolamide-timolol (COSOPT) 22.3-6.8 MG/ML ophthalmic solution Place 1 drop into the left eye 2 (two) times daily.  . Loteprednol Etabonate (INVELTYS) 1 % SUSP Place 1 drop into the left eye 2 (two) times daily.  (Patient taking differently: Place 1 drop into the left eye 2 (two) times a day. )   No current facility-administered medications for this visit.  (Ophthalmic Drugs)   Current Outpatient Medications (Other)  Medication Sig  . aspirin EC 81 MG tablet Take 81 mg by mouth daily.    Marland Kitchen atorvastatin (LIPITOR) 20 MG tablet TAKE ONE TABLET BY MOUTH DAILY (Patient taking differently: Take 20 mg by mouth daily. )  . fluticasone (FLONASE ALLERGY RELIEF) 50 MCG/ACT nasal spray Place 2 sprays into both nostrils daily.   Marland Kitchen levothyroxine (SYNTHROID, LEVOTHROID) 150 MCG tablet Take 150 mcg by mouth every other day.   . levothyroxine (SYNTHROID, LEVOTHROID) 175 MCG tablet Take 175 mcg by mouth every other day.   . metFORMIN (GLUCOPHAGE) 500 MG tablet Take 500 mg by mouth daily.   . metoprolol tartrate (LOPRESSOR) 25 MG tablet Take 25 mg by mouth 2 (two) times daily.    . mometasone (ELOCON) 0.1 % ointment Apply 1 application topically 2 (two) times daily as needed (for psorasis).  . naproxen sodium (ANAPROX) 220 MG tablet Take 440 mg by mouth daily as needed (for pain.).  Marland Kitchen nitroGLYCERIN (NITROSTAT) 0.4 MG SL tablet Place 1 tablet (0.4 mg total) under the tongue every 5 (five) minutes as needed for chest pain.  . tamsulosin (FLOMAX) 0.4 MG CAPS capsule Take 0.4 mg by mouth 2 (two) times daily.  Marland Kitchen  telmisartan-hydrochlorothiazide (MICARDIS HCT) 80-25 MG per tablet Take 1 tablet by mouth daily.     No current facility-administered medications for this visit.  (Other)      REVIEW OF SYSTEMS: ROS    Positive for: Endocrine, Cardiovascular, Eyes   Negative for: Constitutional, Gastrointestinal, Neurological, Skin, Genitourinary, Musculoskeletal, HENT, Respiratory, Psychiatric, Allergic/Imm, Heme/Lymph   Last edited by [name] on 07/04/2019  1:57 PM. (History)       ALLERGIES Allergies  Allergen Reactions  . Azithromycin Swelling  . Shrimp [Shellfish Allergy] Other (See Comments)    Gout flares     PAST MEDICAL HISTORY Past Medical History:  Diagnosis Date  . Arthritis   . CAD (coronary artery disease)    Branch vessel and moderate RCA disease 2006  . Cancer ([place]) 1990   Melanoma Lower right Leg  . Carotid artery disease ([place])   . Diabetes ([place])   . Essential hypertension   . Hyperlipidemia   . Hypothyroidism   . Lyme disease   . PAD (peripheral artery disease) ([place])    Left common iliac stent 2004  . PONV (postoperative nausea and vomiting)    Past Surgical History:  Procedure Laterality Date  . CATARACT EXTRACTION Bilateral   . CATARACT EXTRACTION, BILATERAL    . CERVICAL DISC SURGERY    . COLONOSCOPY N/A 11/06/2016   Procedure: COLONOSCOPY;  Surgeon: Daneil Dolin, MD;  [facility];  Service: Endoscopy;  Laterality: N/A;  7:30 AM  . COLONOSCOPY  2012   Dr. Anthony Sar: normal. reviewed reports, which states he has a history of polyps in remote past.   . EYE SURGERY    . JOINT REPLACEMENT    . LUMBAR WOUND DEBRIDEMENT N/A 01/24/2019   Procedure: LUMBAR WOUND Exploration for Evacation of Seroma vs. Hematoma;  Surgeon: Jovita Gamma, MD;  Location: [facility];  Service: Neurosurgery;  Laterality: N/A;  . MELANOMA EXCISION     right leg, seen at [facility] and underwent immunotherapy  . NECK SURGERY    . REPLACEMENT TOTAL KNEE      FAMILY HISTORY Family History  Problem Relation Age of Onset  . Heart disease Mother   . Heart attack Mother   . Colon polyps Mother   . Heart attack Maternal Grandmother   . Heart attack Maternal Grandfather   . Colon polyps Brother   . Colon cancer Neg Hx     SOCIAL HISTORY Social History   Tobacco Use  . Smoking status: Former Smoker    Packs/day: 1.50    Years: 40.00    Pack years: 60.00    Types: Cigarettes    Start date: 11/04/1963    Quit date: 10/29/2003    Years since quitting: 15.6  . Smokeless tobacco: Never Used  Substance Use Topics  . Alcohol use: Yes    Alcohol/week: 0.0 standard drinks    Comment:  one miller lite daily   . Drug use: No         OPHTHALMIC EXAM:  Base Eye Exam    Visual Acuity (Snellen - Linear)      Right Left   Dist cc 20/20 20/25 -1   Dist ph cc  NI   Correction: Glasses  Notices blurry spot in central vision OS upon exam today.       Tonometry (Tonopen, 2:08 PM)      Right Left   Pressure 19 24       Tonometry #2 (Tonopen, 3:14 PM)  Right Left   Pressure  26       Pupils      Dark Light Shape React APD   Right 3 2 Round Brisk None   Left 3 2 Round Brisk None       Visual Fields (Counting fingers)      Left Right    Full Full       Extraocular Movement      Right Left    Full, Ortho Full, Ortho       Neuro/Psych    Oriented x3: Yes   Mood/Affect: Normal       Dilation    Both eyes: 1.0% Mydriacyl, 2.5% Phenylephrine @ 2:08 PM        Slit Lamp and Fundus Exam    External Exam      Right Left   External brow ptosis brow ptosis; mild periorbital edema       Slit Lamp Exam      Right Left   Lids/Lashes Dermatochalasis - upper lid, Telangiectasia, mild Meibomian gland dysfunction Dermatochalasis - upper lid, Meibomian gland dysfunction; mild periorbital/lid edema, Telangiectasia   Conjunctiva/Sclera superior and temporal Pinguecula nasal and temporal Pinguecula, mild Conjunctivochalasis   Cornea mild arcus, 1+ Punctate epithelial erosions mild arcus, well healed cataract wounds, 1+ Punctate epithelial erosions   Anterior Chamber Deep and quiet, no cell/flare Deep and quiet, no cell   Iris No NVI, Round and dilated No NVI, moderately dilated to 4, Transillumination defects from 0130-0200 with IOL haptic visible within - stable   Lens Posterior chamber intraocular lens in good position Posterior chamber intraocular lens in good position   Vitreous vitreous syneresis Vitreous syneresis; no heme       Fundus Exam      Right Left   Disc Pink and Sharp Pink and Sharp, +SVP   C/D Ratio 0.2 0.3   Macula Flat,  mild RPE  mottling, No heme or edema Flat, mild RPE mottling, No heme or edema   Vessels normal, mildly Tortuous Vascular attenuation, Tortuous   Periphery Attached, round focal area of CR atrophy at 0630 mid zone Attached        Refraction    Wearing Rx      Sphere Cylinder Axis   Right +0.50 Sphere    Left -0.25 +0.25 037   Type: Trifocal       Manifest Refraction      Sphere Cylinder Axis Dist VA   Right       Left -0.25 +0.50 175 20/20-1          IMAGING AND PROCEDURES  Imaging and Procedures for @TODAY @  OCT, Retina - OU - Both Eyes       Right Eye Quality was good. Central Foveal Thickness: 275. Progression has been stable. Findings include normal foveal contour, no SRF, no IRF (partial PVD - stable).   Left Eye Quality was good. Central Foveal Thickness: 276. Progression has been stable. Findings include normal foveal contour, no IRF, no SRF (VMA progressing to partial PVD -- interval release from fovea).   Notes *Images captured and stored on drive  Diagnosis / Impression:  NFP, no IRF, no SRF OU +VMA OS - interval release from fovea    Clinical management:  See below  Abbreviations: NFP - Normal foveal profile. CME - cystoid macular edema. PED - pigment epithelial detachment. IRF - intraretinal fluid. SRF - subretinal fluid. EZ - ellipsoid zone. ERM - epiretinal membrane. ORA - outer retinal atrophy.  ORT - outer retinal tubulation. 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 Pigmentary glaucoma of left eye, mild stage  H40.1321   3. 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 Retinal edema  H35.81 OCT, Retina - OU - Both Eyes  5. Pseudophakia of both eyes  Z96.1   6. Lyme disease  A69.20   7. Brow ptosis  H57.819   8. Dermatochalasis of both upper eyelids  H02.831    H02.834    Pt presents today acutely for "blur spot" in central vision OS. Only  exam finding that corresponds to symptoms is interval release of VMA from fovea. 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 No associated RT/RD  1-3. UGH Syndrome OS  - 3 piece PCIOL OS centered, but iris has large TID in sup temp quadrant with IOL haptic within area  - d/c'd Inveltys on 10/10/18 but had flare up on 11/29/18 and was restarted  - currently on Inveltys, Cosopt, and brimonidine BID OS  - AC with no cell/pigment today and no subjective flare ups since last visit  - IOP elevated today ~25 mmHg on cosopt and brimonidine BID OS  - gonio at previous visit shows open angles OS, mild PAS  - continue inveltys bid OS -- will likely keep this on long term  - cont cosopt bid OS   - increase brimonidine tid OS due to elevated IOP  - f/u 2 weeks for recheck of IOP and DFE  4. No retinal edema on exam or OCT  5. Pseudophakia OU  - s/p CE/IOL OU -- pt can't remember dates, one eye by [facility], other eye by [facility]  - likely [facility] OS as above  - PCIOL OD appears to be in good position  - monitor  6. Lyme disease  - history of diagnosis from target lesions on lower extremities  - no blood test confirmation performed  - completed doxycycline per PCP office  - no manifestation in the eyes, but will continue to monitor  7,8. Brow ptosis, dermatochalasis OU  -  referred to [facility] for eyelid eval (Dr. Vickki Muff)   Ophthalmic Meds Ordered this visit:  No orders of the defined types were placed in this encounter.      Return in about 2 weeks (around 07/18/2019) for IOP check.  There are no Patient Instructions on file for this visit.   Explained the diagnoses, plan, and follow up with the patient and they expressed understanding.  Patient expressed understanding of the importance of proper follow up care.   This document serves as a record of services personally performed by Gardiner Sleeper,  MD, PhD. It was created on their behalf by Ernest Mallick, OA, an ophthalmic assistant. The creation of this record is the provider's dictation and/or activities during the visit.    Electronically signed by: Ernest Mallick, OA 07.06.2020 11:24 PM    Gardiner Sleeper, M.D., Ph.D. Diseases & Surgery of the Retina and Vitreous [facility]  I have reviewed the above documentation for accuracy and completeness, and I agree with the above. Gardiner Sleeper, M.D., Ph.D. 07/04/19 11:24 PM    Abbreviations: M myopia (nearsighted); A astigmatism; H hyperopia (farsighted); P  presbyopia; Mrx spectacle prescription;  CTL contact lenses; OD right eye; OS left eye; OU both eyes  XT exotropia; ET esotropia; PEK punctate epithelial keratitis; PEE punctate epithelial erosions; DES dry eye syndrome; MGD meibomian gland dysfunction; ATs artificial tears; PFAT's preservative free artificial tears; [place] nuclear sclerotic cataract; PSC posterior subcapsular cataract; ERM epi-retinal membrane; PVD posterior vitreous detachment; RD retinal detachment; DM diabetes mellitus; DR diabetic retinopathy; NPDR non-proliferative diabetic retinopathy; PDR proliferative diabetic retinopathy; CSME clinically significant macular edema; DME diabetic macular edema; dbh dot blot hemorrhages; CWS cotton wool spot; POAG primary open angle glaucoma; C/D cup-to-disc ratio; HVF humphrey visual field; GVF goldmann visual field; OCT optical coherence tomography; IOP intraocular pressure; BRVO Branch retinal vein occlusion; CRVO central retinal vein occlusion; CRAO central retinal artery occlusion; BRAO branch retinal artery occlusion; RT retinal tear; SB scleral buckle; PPV pars plana vitrectomy; VH Vitreous hemorrhage; PRP panretinal laser photocoagulation; IVK intravitreal kenalog; VMT vitreomacular traction; MH Macular hole;  NVD neovascularization of the disc; NVE neovascularization elsewhere; AREDS age related eye disease  study; ARMD age related macular degeneration; POAG primary open angle glaucoma; EBMD epithelial/anterior basement membrane dystrophy; ACIOL anterior chamber intraocular lens; IOL intraocular lens; PCIOL posterior chamber intraocular lens; Phaco/IOL phacoemulsification with intraocular lens placement; [place] photorefractive keratectomy; LASIK laser assisted in situ keratomileusis; HTN hypertension; DM diabetes mellitus; COPD chronic obstructive pulmonary disease

## 2019-07-04 ENCOUNTER — Encounter (INDEPENDENT_AMBULATORY_CARE_PROVIDER_SITE_OTHER): Payer: Self-pay | Admitting: Ophthalmology

## 2019-07-04 ENCOUNTER — Ambulatory Visit (INDEPENDENT_AMBULATORY_CARE_PROVIDER_SITE_OTHER): Payer: Medicare Other | Admitting: Ophthalmology

## 2019-07-04 ENCOUNTER — Other Ambulatory Visit: Payer: Self-pay

## 2019-07-04 DIAGNOSIS — H02834 Dermatochalasis of left upper eyelid: Secondary | ICD-10-CM

## 2019-07-04 DIAGNOSIS — H4042X0 Glaucoma secondary to eye inflammation, left eye, stage unspecified: Secondary | ICD-10-CM

## 2019-07-04 DIAGNOSIS — H3581 Retinal edema: Secondary | ICD-10-CM | POA: Diagnosis not present

## 2019-07-04 DIAGNOSIS — T85398A Other mechanical complication of other ocular prosthetic devices, implants and grafts, initial encounter: Secondary | ICD-10-CM

## 2019-07-04 DIAGNOSIS — H401321 Pigmentary glaucoma, left eye, mild stage: Secondary | ICD-10-CM

## 2019-07-04 DIAGNOSIS — H02831 Dermatochalasis of right upper eyelid: Secondary | ICD-10-CM

## 2019-07-04 DIAGNOSIS — H209 Unspecified iridocyclitis: Secondary | ICD-10-CM | POA: Diagnosis not present

## 2019-07-04 DIAGNOSIS — Z961 Presence of intraocular lens: Secondary | ICD-10-CM

## 2019-07-04 DIAGNOSIS — A6920 Lyme disease, unspecified: Secondary | ICD-10-CM | POA: Diagnosis not present

## 2019-07-04 DIAGNOSIS — H57819 Brow ptosis, unspecified: Secondary | ICD-10-CM | POA: Diagnosis not present

## 2019-07-05 ENCOUNTER — Encounter (INDEPENDENT_AMBULATORY_CARE_PROVIDER_SITE_OTHER): Payer: Self-pay | Admitting: Ophthalmology

## 2019-07-18 ENCOUNTER — Encounter (INDEPENDENT_AMBULATORY_CARE_PROVIDER_SITE_OTHER): Payer: Medicare Other | Admitting: Ophthalmology

## 2019-07-18 DIAGNOSIS — Z981 Arthrodesis status: Secondary | ICD-10-CM | POA: Diagnosis not present

## 2019-07-18 NOTE — Progress Notes (Signed)
[facility] Note  07/19/2019     CHIEF COMPLAINT Patient presents for Retina Follow Up   HISTORY OF PRESENT ILLNESS: Christopher Randolph is a 73 y.o. male who presents to the clinic today for:   HPI    Retina Follow Up    Patient presents with  Other.  In left eye.  This started 2 weeks ago.  Severity is moderate.  I, the attending physician,  performed the HPI with the patient and updated documentation appropriately.          Comments    Patient here for 2 weeks retina follow up for IOP check OS. Patient states vision doing ok. OS a little blurry sometimes, not like used to be. No eye pain. Used drops this am.       Last edited by [name], MD on 07/19/2019 10:39 AM. (History)    Patient states his left eye is getting foggy/hazy intermittently, pt is taking inveltys, brimonidine and cosopt BID OS, pt states he noticed a grey spot in his central vision while reading the eye chart today, he states there is no change with the right eye  Referring physician: Curlene Labrum, MD [place],  [place] [address]  HISTORICAL INFORMATION:   Selected notes from the MEDICAL RECORD NUMBER Referred by Dr. [name] for concern of vitreous hemorrhage LEE: 05.21.19 [name]) [BCVA: OD: OS: ] Ocular Hx-Psedophakia OU,  PMH-DM (on Metformin), Arthritis     CURRENT MEDICATIONS: Current Outpatient Medications (Ophthalmic Drugs)  Medication Sig  . brimonidine (ALPHAGAN) 0.2 % ophthalmic solution Place 1 drop into the left eye 2 (two) times daily.  . brimonidine (ALPHAGAN) 0.2 % ophthalmic solution Place 1 drop into the left eye 3 (three) times daily.  . dorzolamide-timolol (COSOPT) 22.3-6.8 MG/ML ophthalmic solution Place 1 drop into the left eye 2 (two) times daily.  . Loteprednol Etabonate (INVELTYS) 1 % SUSP Place 1 drop into the left eye 2 (two) times daily. (Patient taking differently: Place 1 drop into the left eye 2 (two) times a day. )  .  Loteprednol Etabonate (INVELTYS) 1 % SUSP Apply 1 drop to eye 2 (two) times a day. 1 drop 2 times daily left eye   No current facility-administered medications for this visit.  (Ophthalmic Drugs)   Current Outpatient Medications (Other)  Medication Sig  . aspirin EC 81 MG tablet Take 81 mg by mouth daily.    Marland Kitchen atorvastatin (LIPITOR) 20 MG tablet TAKE ONE TABLET BY MOUTH DAILY (Patient taking differently: Take 20 mg by mouth daily. )  . fluticasone (FLONASE ALLERGY RELIEF) 50 MCG/ACT nasal spray Place 2 sprays into both nostrils daily.   Marland Kitchen levothyroxine (SYNTHROID, LEVOTHROID) 150 MCG tablet Take 150 mcg by mouth every other day.   . levothyroxine (SYNTHROID, LEVOTHROID) 175 MCG tablet Take 175 mcg by mouth every other day.   . metFORMIN (GLUCOPHAGE) 500 MG tablet Take 500 mg by mouth daily.   . metoprolol tartrate (LOPRESSOR) 25 MG tablet Take 25 mg by mouth 2 (two) times daily.    . mometasone (ELOCON) 0.1 % ointment Apply 1 application topically 2 (two) times daily as needed (for psorasis).  . naproxen sodium (ANAPROX) 220 MG tablet Take 440 mg by mouth daily as needed (for pain.).  Marland Kitchen nitroGLYCERIN (NITROSTAT) 0.4 MG SL tablet Place 1 tablet (0.4 mg total) under the tongue every 5 (five) minutes as needed for chest pain.  . tamsulosin (FLOMAX) 0.4 MG  CAPS capsule Take 0.4 mg by mouth 2 (two) times daily.  Marland Kitchen telmisartan-hydrochlorothiazide (MICARDIS HCT) 80-25 MG per tablet Take 1 tablet by mouth daily.     No current facility-administered medications for this visit.  (Other)      REVIEW OF SYSTEMS: ROS    Positive for: Endocrine, Cardiovascular, Eyes   Negative for: Constitutional, Gastrointestinal, Neurological, Skin, Genitourinary, Musculoskeletal, HENT, Respiratory, Psychiatric, Allergic/Imm, Heme/Lymph   Last edited by [name] on 07/19/2019  9:18 AM. (History)       ALLERGIES Allergies  Allergen Reactions  . Azithromycin Swelling  . Shrimp [Shellfish Allergy] Other  (See Comments)    Gout flares    PAST MEDICAL HISTORY Past Medical History:  Diagnosis Date  . Arthritis   . CAD (coronary artery disease)    Branch vessel and moderate RCA disease 2006  . Cancer ([place]) 1990   Melanoma Lower right Leg  . Carotid artery disease ([place])   . Diabetes ([place])   . Essential hypertension   . Hyperlipidemia   . Hypothyroidism   . Lyme disease   . PAD (peripheral artery disease) ([place])    Left common iliac stent 2004  . PONV (postoperative nausea and vomiting)    Past Surgical History:  Procedure Laterality Date  . CATARACT EXTRACTION Bilateral   . CATARACT EXTRACTION, BILATERAL    . CERVICAL DISC SURGERY    . COLONOSCOPY N/A 11/06/2016   Procedure: COLONOSCOPY;  Surgeon: Daneil Dolin, MD;  Location[facility];  Service: Endoscopy;  Laterality: N/A;  7:30 AM  . COLONOSCOPY  2012   Dr. Anthony Sar: normal. reviewed reports, which states he has a history of polyps in remote past.   . EYE SURGERY    . JOINT REPLACEMENT    . LUMBAR WOUND DEBRIDEMENT N/A 01/24/2019   Procedure: LUMBAR WOUND Exploration for Evacation of Seroma vs. Hematoma;  Surgeon: Jovita Gamma, MD;  Location: [facility];  Service: Neurosurgery;  Laterality: N/A;  . MELANOMA EXCISION     right leg, [facility] and underwent immunotherapy  . NECK SURGERY    . REPLACEMENT TOTAL KNEE      FAMILY HISTORY Family History  Problem Relation Age of Onset  . Heart disease Mother   . Heart attack Mother   . Colon polyps Mother   . Heart attack Maternal Grandmother   . Heart attack Maternal Grandfather   . Colon polyps Brother   . Colon cancer Neg Hx     SOCIAL HISTORY Social History   Tobacco Use  . Smoking status: Former Smoker    Packs/day: 1.50    Years: 40.00    Pack years: 60.00    Types: Cigarettes    Start date: 11/04/1963    Quit date: 10/29/2003    Years since quitting: 15.7  . Smokeless tobacco: Never Used  Substance Use Topics  . Alcohol use: Yes    Alcohol/week: 0.0  standard drinks    Comment: one miller lite daily   . Drug use: No         OPHTHALMIC EXAM:  Base Eye Exam    Visual Acuity (Snellen - Linear)      Right Left   Dist cc 20/20 20/25   Dist ph cc  20/20 -2   Correction: Glasses       Tonometry (Tonopen, 9:15 AM)      Right Left   Pressure 22 18       Pupils  Dark Light Shape React APD   Right 3 2 Round Brisk None   Left 3 2 Round Brisk None       Visual Fields (Counting fingers)      Left Right    Full Full       Extraocular Movement      Right Left    Full, Ortho Full, Ortho       Neuro/Psych    Oriented x3: Yes   Mood/Affect: Normal       Dilation    Both eyes: 1.0% Mydriacyl, 2.5% Phenylephrine @ 9:15 AM        Slit Lamp and Fundus Exam    External Exam      Right Left   External brow ptosis brow ptosis; mild periorbital edema       Slit Lamp Exam      Right Left   Lids/Lashes Dermatochalasis - upper lid, Telangiectasia, mild Meibomian gland dysfunction Dermatochalasis - upper lid, Meibomian gland dysfunction, Telangiectasia   Conjunctiva/Sclera superior and temporal Pinguecula nasal and temporal Pinguecula, mild Conjunctivochalasis   Cornea mild arcus, 1+ Punctate epithelial erosions mild arcus, well healed cataract wounds, 1+ Punctate epithelial erosions   Anterior Chamber Deep and quiet, no cell/flare Deep and quiet, no cell   Iris No NVI, Round and dilated No NVI, moderately dilated to 4, Transillumination defects from 0100-0200 with IOL haptic visible within - stable   Lens Posterior chamber intraocular lens in good position Posterior chamber intraocular lens in good position   Vitreous vitreous syneresis Vitreous syneresis; no heme       Fundus Exam      Right Left   Disc Pink and Sharp Pink and Sharp   C/D Ratio 0.2 0.3   Macula Flat,  mild RPE mottling, No heme or edema Flat, mild RPE mottling, No heme or edema   Vessels normal, mildly Tortuous Vascular attenuation, Tortuous    Periphery Attached, round focal area of CR atrophy at 0630 mid zone Attached        Refraction    Wearing Rx      Sphere Cylinder Axis   Right +0.50 Sphere    Left -0.25 +0.25 037   Type: Trifocal          IMAGING AND PROCEDURES  Imaging and Procedures for @TODAY @  OCT, Retina - OU - Both Eyes       Right Eye Quality was good. Central Foveal Thickness: 274. Progression has been stable. Findings include normal foveal contour, no SRF, no IRF (partial PVD - stable).   Left Eye Quality was good. Central Foveal Thickness: 273. Progression has been stable. Findings include normal foveal contour, no IRF, no SRF (Partial PVD).   Notes *Images captured and stored on drive  Diagnosis / Impression:  NFP, no IRF, no SRF OU Partial PVD OS  Clinical management:  See below  Abbreviations: NFP - Normal foveal profile. CME - cystoid macular edema. PED - pigment epithelial detachment. IRF - intraretinal fluid. SRF - subretinal fluid. EZ - ellipsoid zone. ERM - epiretinal membrane. ORA - outer retinal atrophy. ORT - outer retinal tubulation. 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 Pigmentary glaucoma of left eye, mild stage  H40.1321   3. 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 Retinal edema  H35.81 OCT,  Retina - OU - Both Eyes  5. Pseudophakia of both eyes  Z96.1   6. Lyme disease  A69.20   7. Brow ptosis  H57.819   8. Dermatochalasis of both upper eyelids  H02.831    H02.834    1-3. UGH Syndrome OS  - 3 piece PCIOL OS centered, but iris has large TID in sup temp quadrant with IOL haptic within area  - d/c'd Inveltys on 10/10/18 but had flare up on 11/29/18 and was restarted  - currently on Inveltys BID, Cosopt BID, and brimonidine TID OS  - AC with no cell/pigment today and no subjective flare ups since last visit  - IOP improved to 18 today on cosopt bid and  brimonidine increased to TID OS  - gonio at previous visit shows open angles OS, mild PAS  - continue inveltys bid OS -- will likely keep this on long term  - cont cosopt bid OS   - continue brimonidine tid OS   - f/u 2 months for recheck of IOP and DFE  4. No retinal edema on exam or OCT  5. Pseudophakia OU  - s/p CE/IOL OU -- pt can't remember dates, one eye by [facility], other eye by [facility]  - likely U[facility] OS as above  - PCIOL OD appears to be in good position  - monitor  6. Lyme disease  - history of diagnosis from target lesions on lower extremities  - no blood test confirmation performed  - completed doxycycline per PCP office  - no manifestation in the eyes, but will continue to monitor  7,8. Brow ptosis, dermatochalasis OU  -  referred to [facility] for eyelid eval (Dr. Vickki Muff)   Ophthalmic Meds Ordered this visit:  Meds ordered this encounter  Medications  . brimonidine (ALPHAGAN) 0.2 % ophthalmic solution    Sig: Place 1 drop into the left eye 3 (three) times daily.    Dispense:  10 mL    Refill:  10  . Loteprednol Etabonate (INVELTYS) 1 % SUSP    Sig: Apply 1 drop to eye 2 (two) times a day. 1 drop 2 times daily left eye    Dispense:  2.8 mL    Refill:  10       Return 2 months, for DFE, OCT.  There are no Patient Instructions on file for this visit.   Explained the diagnoses, plan, and follow up with the patient and they expressed understanding.  Patient expressed understanding of the importance of proper follow up care.   This document serves as a record of services personally performed by Gardiner Sleeper, MD, PhD. It was created on their behalf by Ernest Mallick, OA, an ophthalmic assistant. The creation of this record is the provider's dictation and/or activities during the visit.    Electronically signed by: Ernest Mallick, OA  07.21.2020 10:49 AM     Gardiner Sleeper, M.D., Ph.D. Diseases & Surgery of the Retina and Vitreous [facility]  I have reviewed the above documentation for accuracy and completeness, and I agree with the above. Gardiner Sleeper, M.D., Ph.D. 07/19/19 10:54 AM     Abbreviations: M myopia (nearsighted); A astigmatism; H hyperopia (farsighted); P presbyopia; Mrx spectacle prescription;  CTL contact lenses; OD right eye; OS left eye; OU both eyes  XT exotropia; ET esotropia; PEK punctate epithelial keratitis; PEE punctate epithelial erosions; DES dry eye syndrome; MGD meibomian gland dysfunction; ATs artificial tears; PFAT's preservative free artificial tears;  [place] nuclear sclerotic cataract; PSC posterior subcapsular cataract; ERM epi-retinal membrane; PVD posterior vitreous detachment; RD retinal detachment; DM diabetes mellitus; DR diabetic retinopathy; NPDR non-proliferative diabetic retinopathy; PDR proliferative diabetic retinopathy; CSME clinically significant macular edema; DME diabetic macular edema; dbh dot blot hemorrhages; CWS cotton wool spot; POAG primary open angle glaucoma; C/D cup-to-disc ratio; HVF humphrey visual field; GVF goldmann visual field; OCT optical coherence tomography; IOP intraocular pressure; BRVO Branch retinal vein occlusion; CRVO central retinal vein occlusion; CRAO central retinal artery occlusion; BRAO branch retinal artery occlusion; RT retinal tear; SB scleral buckle; PPV pars plana vitrectomy; VH Vitreous hemorrhage; PRP panretinal laser photocoagulation; IVK intravitreal kenalog; VMT vitreomacular traction; MH Macular hole;  NVD neovascularization of the disc; NVE neovascularization elsewhere; AREDS age related eye disease study; ARMD age related macular degeneration; POAG primary open angle glaucoma; EBMD epithelial/anterior basement membrane dystrophy; ACIOL anterior chamber intraocular lens; IOL intraocular lens; PCIOL posterior chamber intraocular lens; Phaco/IOL phacoemulsification with intraocular lens placement; [place] photorefractive keratectomy; LASIK laser assisted  in situ keratomileusis; HTN hypertension; DM diabetes mellitus; COPD chronic obstructive pulmonary disease

## 2019-07-19 ENCOUNTER — Ambulatory Visit (INDEPENDENT_AMBULATORY_CARE_PROVIDER_SITE_OTHER): Payer: Medicare Other | Admitting: Ophthalmology

## 2019-07-19 ENCOUNTER — Other Ambulatory Visit: Payer: Self-pay

## 2019-07-19 ENCOUNTER — Encounter (INDEPENDENT_AMBULATORY_CARE_PROVIDER_SITE_OTHER): Payer: Self-pay | Admitting: Ophthalmology

## 2019-07-19 DIAGNOSIS — A6920 Lyme disease, unspecified: Secondary | ICD-10-CM

## 2019-07-19 DIAGNOSIS — H401321 Pigmentary glaucoma, left eye, mild stage: Secondary | ICD-10-CM | POA: Diagnosis not present

## 2019-07-19 DIAGNOSIS — H209 Unspecified iridocyclitis: Secondary | ICD-10-CM | POA: Diagnosis not present

## 2019-07-19 DIAGNOSIS — H02831 Dermatochalasis of right upper eyelid: Secondary | ICD-10-CM | POA: Diagnosis not present

## 2019-07-19 DIAGNOSIS — T85398A Other mechanical complication of other ocular prosthetic devices, implants and grafts, initial encounter: Secondary | ICD-10-CM

## 2019-07-19 DIAGNOSIS — H02834 Dermatochalasis of left upper eyelid: Secondary | ICD-10-CM

## 2019-07-19 DIAGNOSIS — H3581 Retinal edema: Secondary | ICD-10-CM

## 2019-07-19 DIAGNOSIS — H57819 Brow ptosis, unspecified: Secondary | ICD-10-CM | POA: Diagnosis not present

## 2019-07-19 DIAGNOSIS — H4042X0 Glaucoma secondary to eye inflammation, left eye, stage unspecified: Secondary | ICD-10-CM

## 2019-07-19 DIAGNOSIS — Z961 Presence of intraocular lens: Secondary | ICD-10-CM | POA: Diagnosis not present

## 2019-07-19 MED ORDER — BRIMONIDINE TARTRATE 0.2 % OP SOLN: 1.0000 [drp] | Freq: Three times a day (TID) | OPHTHALMIC | 10 refills | Status: AC

## 2019-07-19 MED ORDER — INVELTYS 1 % OP SUSP: 1.0000 [drp] | Freq: Two times a day (BID) | OPHTHALMIC | 10 refills | Status: DC

## 2019-08-14 ENCOUNTER — Other Ambulatory Visit: Payer: Self-pay

## 2019-08-14 DIAGNOSIS — Z20822 Contact with and (suspected) exposure to covid-19: Secondary | ICD-10-CM

## 2019-08-16 ENCOUNTER — Encounter (INDEPENDENT_AMBULATORY_CARE_PROVIDER_SITE_OTHER): Payer: Medicare Other | Admitting: Ophthalmology

## 2019-08-16 LAB — NOVEL CORONAVIRUS, NAA: SARS-CoV-2, NAA: NOT DETECTED

## 2019-08-24 NOTE — Progress Notes (Signed)
[facility] Note  08/28/2019     CHIEF COMPLAINT Patient presents for Retina Follow Up   HISTORY OF PRESENT ILLNESS: Christopher Randolph is a 73 y.o. male who presents to the clinic today for:   HPI    Retina Follow Up    Patient presents with  Other.  In left eye.  This started 5.5 weeks ago.  Severity is moderate.  I, the attending physician,  performed the HPI with the patient and updated documentation appropriately.          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 Patient states vision doing ok. Lately everything a little fuzzy, a little out of focus. No eye pain.       Last edited by [name], MD on 08/28/2019  8:33 PM. (History)    Patient states he feels like his left eye is a little fuzzy today, he states he is taking inveltys, cosopt and brimonidine all twice a day in the left eye     Referring physician: Curlene Labrum, MD [place],  [place] [address]  HISTORICAL INFORMATION:   Selected notes from the MEDICAL RECORD NUMBER Referred by Dr. [name] for concern of vitreous hemorrhage LEE: 05.21.19 [name]) [BCVA: OD: OS: ] Ocular Hx-Psedophakia OU,  PMH-DM (on Metformin), Arthritis     CURRENT MEDICATIONS: Current Outpatient Medications (Ophthalmic Drugs)  Medication Sig  . brimonidine (ALPHAGAN) 0.2 % ophthalmic solution Place 1 drop into the left eye 2 (two) times daily.  . brimonidine (ALPHAGAN) 0.2 % ophthalmic solution Place 1 drop into the left eye 3 (three) times daily.  . dorzolamide-timolol (COSOPT) 22.3-6.8 MG/ML ophthalmic solution Place 1 drop into the left eye 2 (two) times daily.  . Loteprednol Etabonate (INVELTYS) 1 % SUSP Place 1 drop into the left eye 2 (two) times daily. (Patient taking differently: Place 1 drop into the left eye 2 (two) times a day. )  . Loteprednol Etabonate (INVELTYS) 1 % SUSP Apply 1 drop to eye 2 (two) times a day. 1 drop 2 times daily  left eye   No current facility-administered medications for this visit.  (Ophthalmic Drugs)   Current Outpatient Medications (Other)  Medication Sig  . aspirin EC 81 MG tablet Take 81 mg by mouth daily.    Marland Kitchen atorvastatin (LIPITOR) 20 MG tablet TAKE ONE TABLET BY MOUTH DAILY (Patient taking differently: Take 20 mg by mouth daily. )  . fluticasone (FLONASE ALLERGY RELIEF) 50 MCG/ACT nasal spray Place 2 sprays into both nostrils daily.   Marland Kitchen levothyroxine (SYNTHROID, LEVOTHROID) 150 MCG tablet Take 150 mcg by mouth every other day.   . levothyroxine (SYNTHROID, LEVOTHROID) 175 MCG tablet Take 175 mcg by mouth every other day.   . metFORMIN (GLUCOPHAGE) 500 MG tablet Take 500 mg by mouth daily.   . metoprolol tartrate (LOPRESSOR) 25 MG tablet Take 25 mg by mouth 2 (two) times daily.    . mometasone (ELOCON) 0.1 % ointment Apply 1 application topically 2 (two) times daily as needed (for psorasis).  . naproxen sodium (ANAPROX) 220 MG tablet Take 440 mg by mouth daily as needed (for pain.).  Marland Kitchen nitroGLYCERIN (NITROSTAT) 0.4 MG SL tablet Place 1 tablet (0.4 mg total) under the tongue every 5 (five) minutes x 3 doses as needed for chest pain (if no relief after 3rd dose, proceed to the ED for an evaluation or call 911).  Marland Kitchen  tamsulosin (FLOMAX) 0.4 MG CAPS capsule Take 0.4 mg by mouth 2 (two) times daily.  Marland Kitchen telmisartan-hydrochlorothiazide (MICARDIS HCT) 80-25 MG per tablet Take 1 tablet by mouth daily.     No current facility-administered medications for this visit.  (Other)      REVIEW OF SYSTEMS: ROS    Positive for: Endocrine, Cardiovascular, Eyes   Negative for: Constitutional, Gastrointestinal, Neurological, Skin, Genitourinary, Musculoskeletal, HENT, Respiratory, Psychiatric, Allergic/Imm, Heme/Lymph   Last edited by [name], COA on 08/28/2019  9:01 AM. (History)       ALLERGIES Allergies  Allergen Reactions  . Azithromycin Swelling  . Shrimp [Shellfish Allergy] Other (See  Comments)    Gout flares    PAST MEDICAL HISTORY Past Medical History:  Diagnosis Date  . Arthritis   . CAD (coronary artery disease)    Branch vessel and moderate RCA disease 2006  . Cancer ([place]) 1990   Melanoma Lower right Leg  . Carotid artery disease ([place])   . Diabetes ([place])   . Essential hypertension   . Hyperlipidemia   . Hypothyroidism   . Lyme disease   . PAD (peripheral artery disease) ([place])    Left common iliac stent 2004  . PONV (postoperative nausea and vomiting)    Past Surgical History:  Procedure Laterality Date  . CATARACT EXTRACTION Bilateral   . CATARACT EXTRACTION, BILATERAL    . CERVICAL DISC SURGERY    . COLONOSCOPY N/A 11/06/2016   Procedure: COLONOSCOPY;  Surgeon: Daneil Dolin, MD;  [facility];  Service: Endoscopy;  Laterality: N/A;  7:30 AM  . COLONOSCOPY  2012   Dr. Anthony Sar: normal. reviewed reports, which states he has a history of polyps in remote past.   . EYE SURGERY    . JOINT REPLACEMENT    . LUMBAR WOUND DEBRIDEMENT N/A 01/24/2019   Procedure: LUMBAR WOUND Exploration for Evacation of Seroma vs. Hematoma;  Surgeon: Jovita Gamma, MD;  Location: [facility];  Service: Neurosurgery;  Laterality: N/A;  . MELANOMA EXCISION     right leg, [facility] at [facility] and underwent immunotherapy  . NECK SURGERY    . REPLACEMENT TOTAL KNEE      FAMILY HISTORY Family History  Problem Relation Age of Onset  . Heart disease Mother   . Heart attack Mother   . Colon polyps Mother   . Heart attack Maternal Grandmother   . Heart attack Maternal Grandfather   . Colon polyps Brother   . Colon cancer Neg Hx     SOCIAL HISTORY Social History   Tobacco Use  . Smoking status: Former Smoker    Packs/day: 1.50    Years: 40.00    Pack years: 60.00    Types: Cigarettes    Start date: 11/04/1963    Quit date: 10/29/2003    Years since quitting: 15.8  . Smokeless tobacco: Never Used  Substance Use Topics  . Alcohol use: Yes    Alcohol/week: 0.0  standard drinks    Comment: one miller lite daily   . Drug use: No         OPHTHALMIC EXAM:  Base Eye Exam    Visual Acuity (Snellen - Linear)      Right Left   Dist cc 20/20 -1 20/30 +2   Dist ph cc  20/25   Correction: Glasses       Tonometry (Tonopen, 8:57 AM)      Right Left   Pressure 19 21  Pupils      Dark Light Shape React APD   Right 3 2 Round Brisk None   Left 3 2 Round Brisk None       Visual Fields (Counting fingers)      Left Right    Full Full       Extraocular Movement      Right Left    Full, Ortho Full, Ortho       Neuro/Psych    Oriented x3: Yes   Mood/Affect: Normal       Dilation    Both eyes: 1.0% Mydriacyl, 2.5% Phenylephrine @ 8:57 AM        Slit Lamp and Fundus Exam    External Exam      Right Left   External brow ptosis brow ptosis; mild periorbital edema       Slit Lamp Exam      Right Left   Lids/Lashes Dermatochalasis - upper lid, Telangiectasia, mild Meibomian gland dysfunction Dermatochalasis - upper lid, Meibomian gland dysfunction, Telangiectasia   Conjunctiva/Sclera superior and temporal Pinguecula nasal and temporal Pinguecula, mild Conjunctivochalasis   Cornea mild arcus, 1+ Punctate epithelial erosions mild arcus, well healed cataract wounds, 1+ Punctate epithelial erosions   Anterior Chamber Deep and quiet, no cell/flare Deep, 2+pigment   Iris No NVI, Round and dilated No NVI, moderately dilated to 4, Transillumination defects from 0100-0200 with IOL haptic visible within - stable   Lens Posterior chamber intraocular lens in good position Posterior chamber intraocular lens in good position   Vitreous vitreous syneresis Vitreous syneresis; no heme       Fundus Exam      Right Left   Disc Pink and Sharp Pink and Sharp   C/D Ratio 0.2 0.3   Macula Flat,  mild RPE mottling, No heme or edema Flat, mild RPE mottling, No heme or edema   Vessels normal, mildly Tortuous Vascular attenuation, Tortuous   Periphery  Attached, round focal area of CR atrophy at 0630 mid zone Attached        Refraction    Wearing Rx      Sphere Cylinder Axis   Right +0.50 Sphere    Left -0.25 +0.25 037   Type: Trifocal          IMAGING AND PROCEDURES  Imaging and Procedures for @TODAY @  OCT, Retina - OU - Both Eyes       Right Eye Quality was good. Central Foveal Thickness: 272. Progression has been stable. Findings include normal foveal contour, no SRF, no IRF (partial PVD - stable).   Left Eye Quality was good. Central Foveal Thickness: 264. Progression has been stable. Findings include normal foveal contour, no IRF, no SRF (Partial PVD).   Notes *Images captured and stored on drive  Diagnosis / Impression:  NFP, no IRF, no SRF OU Partial PVD OS  Clinical management:  See below  Abbreviations: NFP - Normal foveal profile. CME - cystoid macular edema. PED - pigment epithelial detachment. IRF - intraretinal fluid. SRF - subretinal fluid. EZ - ellipsoid zone. ERM - epiretinal membrane. ORA - outer retinal atrophy. ORT - outer retinal tubulation. 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 Pigmentary glaucoma of left eye, mild stage  H40.1321   3. 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 Retinal edema  H35.81 OCT, Retina - OU - Both Eyes  5. Pseudophakia of both eyes  Z96.1   6. Lyme disease  A69.20   7. Brow ptosis  H57.819   8. Dermatochalasis of both upper eyelids  H02.831    H02.834    1-3. UGH Syndrome OS  - 3 piece PCIOL OS centered, but iris has large TID in sup temp quadrant with IOL haptic within area  - d/c'd Inveltys on 10/10/18 but had flare up on 11/29/18 and was restarted  - currently on Inveltys BID, Cosopt BID, and brimonidine TID OS  - pt presents for early f/u due to subjective decrease in vision OS  - AC with 2+pigment today -- increased from prior -- new flare  up  - IOP at 21 today on cosopt bid and brimonidine  - gonio at previous visit shows open angles OS, mild PAS  - increase inveltys tid OS   - increase cosopt tid OS   - continue brimonidine tid OS   - f/u 4 weeks for recheck of IOP and DFE  4. No retinal edema on exam or OCT  5. Pseudophakia OU  - s/p CE/IOL OU -- pt can't remember dates, one eye by [facility], other eye by [place]  - likely UGH OS as above  - PCIOL OD appears to be in good position  - monitor  6. Lyme disease  - history of diagnosis from target lesions on lower extremities  - no blood test confirmation performed  - completed doxycycline per PCP office  - no manifestation in the eyes, but will continue to monitor  7,8. Brow ptosis, dermatochalasis OU  -  referred to [facility] for eyelid eval (Dr. Vickki Muff)     Ophthalmic Meds Ordered this visit:  No orders of the defined types were placed in this encounter.      Return in about 4 weeks (around 09/25/2019) for f/u UGH syndrome OS, DFE, OCT.  There are no Patient Instructions on file for this visit.   Explained the diagnoses, plan, and follow up with the patient and they expressed understanding.  Patient expressed understanding of the importance of proper follow up care.   This document serves as a record of services personally performed by Gardiner Sleeper, MD, PhD. It was created on their behalf by Roselee Nova, COMT. The creation of this record is the provider's dictation and/or activities during the visit.  Electronically signed by: Roselee Nova, COMT 08/28/19 8:33 PM   This document serves as a record of services personally performed by Gardiner Sleeper, MD, PhD. It was created on their behalf by Ernest Mallick, OA, an ophthalmic assistant. The creation of this record is the provider's dictation and/or activities during the visit.    Electronically signed by: Ernest Mallick, OA  08.31.2020 8:33 PM    Gardiner Sleeper, M.D., Ph.D. Diseases & Surgery of the  Retina and Vitreous [facility]  I have reviewed the above documentation for accuracy and completeness, and I agree with the above. Gardiner Sleeper, M.D., Ph.D. 08/28/19 8:36 PM   Abbreviations: M myopia (nearsighted); A astigmatism; H hyperopia (farsighted); P presbyopia; Mrx spectacle prescription;  CTL contact lenses; OD right eye; OS left eye; OU both eyes  XT exotropia; ET esotropia; PEK punctate epithelial keratitis; PEE punctate epithelial erosions; DES dry eye syndrome; MGD meibomian gland dysfunction; ATs artificial tears; PFAT's preservative free artificial tears; [place] nuclear sclerotic cataract; PSC posterior subcapsular cataract; ERM epi-retinal membrane; PVD  posterior vitreous detachment; RD retinal detachment; DM diabetes mellitus; DR diabetic retinopathy; NPDR non-proliferative diabetic retinopathy; PDR proliferative diabetic retinopathy; CSME clinically significant macular edema; DME diabetic macular edema; dbh dot blot hemorrhages; CWS cotton wool spot; POAG primary open angle glaucoma; C/D cup-to-disc ratio; HVF humphrey visual field; GVF goldmann visual field; OCT optical coherence tomography; IOP intraocular pressure; BRVO Branch retinal vein occlusion; CRVO central retinal vein occlusion; CRAO central retinal artery occlusion; BRAO branch retinal artery occlusion; RT retinal tear; SB scleral buckle; PPV pars plana vitrectomy; VH Vitreous hemorrhage; PRP panretinal laser photocoagulation; IVK intravitreal kenalog; VMT vitreomacular traction; MH Macular hole;  NVD neovascularization of the disc; NVE neovascularization elsewhere; AREDS age related eye disease study; ARMD age related macular degeneration; POAG primary open angle glaucoma; EBMD epithelial/anterior basement membrane dystrophy; ACIOL anterior chamber intraocular lens; IOL intraocular lens; PCIOL posterior chamber intraocular lens; Phaco/IOL phacoemulsification with intraocular lens placement; [place]  photorefractive keratectomy; LASIK laser assisted in situ keratomileusis; HTN hypertension; DM diabetes mellitus; COPD chronic obstructive pulmonary disease

## 2019-08-25 ENCOUNTER — Other Ambulatory Visit: Payer: Self-pay | Admitting: Cardiology

## 2019-08-28 ENCOUNTER — Ambulatory Visit (INDEPENDENT_AMBULATORY_CARE_PROVIDER_SITE_OTHER): Payer: Medicare Other | Admitting: Ophthalmology

## 2019-08-28 ENCOUNTER — Other Ambulatory Visit: Payer: Self-pay

## 2019-08-28 ENCOUNTER — Encounter (INDEPENDENT_AMBULATORY_CARE_PROVIDER_SITE_OTHER): Payer: Self-pay | Admitting: Ophthalmology

## 2019-08-28 DIAGNOSIS — H3581 Retinal edema: Secondary | ICD-10-CM | POA: Diagnosis not present

## 2019-08-28 DIAGNOSIS — H02834 Dermatochalasis of left upper eyelid: Secondary | ICD-10-CM | POA: Diagnosis not present

## 2019-08-28 DIAGNOSIS — H57819 Brow ptosis, unspecified: Secondary | ICD-10-CM | POA: Diagnosis not present

## 2019-08-28 DIAGNOSIS — H401321 Pigmentary glaucoma, left eye, mild stage: Secondary | ICD-10-CM | POA: Diagnosis not present

## 2019-08-28 DIAGNOSIS — Z961 Presence of intraocular lens: Secondary | ICD-10-CM | POA: Diagnosis not present

## 2019-08-28 DIAGNOSIS — A6920 Lyme disease, unspecified: Secondary | ICD-10-CM | POA: Diagnosis not present

## 2019-08-28 DIAGNOSIS — T85398A Other mechanical complication of other ocular prosthetic devices, implants and grafts, initial encounter: Secondary | ICD-10-CM

## 2019-08-28 DIAGNOSIS — H02831 Dermatochalasis of right upper eyelid: Secondary | ICD-10-CM

## 2019-08-28 DIAGNOSIS — H209 Unspecified iridocyclitis: Secondary | ICD-10-CM | POA: Diagnosis not present

## 2019-09-06 ENCOUNTER — Ambulatory Visit (INDEPENDENT_AMBULATORY_CARE_PROVIDER_SITE_OTHER): Payer: Medicare Other

## 2019-09-06 ENCOUNTER — Other Ambulatory Visit: Payer: Self-pay

## 2019-09-06 DIAGNOSIS — I779 Disorder of arteries and arterioles, unspecified: Secondary | ICD-10-CM

## 2019-09-06 DIAGNOSIS — I739 Peripheral vascular disease, unspecified: Secondary | ICD-10-CM

## 2019-09-08 DIAGNOSIS — Z23 Encounter for immunization: Secondary | ICD-10-CM | POA: Diagnosis not present

## 2019-09-13 ENCOUNTER — Encounter (INDEPENDENT_AMBULATORY_CARE_PROVIDER_SITE_OTHER): Payer: Medicare Other | Admitting: Ophthalmology

## 2019-09-14 ENCOUNTER — Other Ambulatory Visit: Payer: Self-pay

## 2019-09-14 ENCOUNTER — Encounter: Payer: Self-pay | Admitting: Cardiology

## 2019-09-14 ENCOUNTER — Ambulatory Visit (INDEPENDENT_AMBULATORY_CARE_PROVIDER_SITE_OTHER): Payer: Medicare Other | Admitting: Cardiology

## 2019-09-14 VITALS — BP 129/69 | HR 60 | Temp 97.5°F | Ht 68.0 in | Wt 186.0 lb

## 2019-09-14 DIAGNOSIS — E782 Mixed hyperlipidemia: Secondary | ICD-10-CM

## 2019-09-14 DIAGNOSIS — I10 Essential (primary) hypertension: Secondary | ICD-10-CM

## 2019-09-14 DIAGNOSIS — I6523 Occlusion and stenosis of bilateral carotid arteries: Secondary | ICD-10-CM | POA: Diagnosis not present

## 2019-09-14 DIAGNOSIS — I25119 Atherosclerotic heart disease of native coronary artery with unspecified angina pectoris: Secondary | ICD-10-CM | POA: Diagnosis not present

## 2019-09-14 MED ORDER — TELMISARTAN 80 MG PO TABS: 80.0000 mg | ORAL_TABLET | Freq: Every day | ORAL | 3 refills | Status: DC

## 2019-09-14 NOTE — Patient Instructions (Signed)
Medication Instructions: STOP Micardis HCTZ  START Telmisartan 80 mg daily  Labwork: None today  Procedures/Testing: None today  Follow-Up: 6 months with Dr.McDowell  Any Additional Special Instructions Will Be Listed Below (If Applicable).     If you need a refill on your cardiac medications before your next appointment, please call your pharmacy.     Thank you for choosing [facility] !

## 2019-09-14 NOTE — Progress Notes (Signed)
Cardiology Office Note  Date: 09/14/2019   ID: Christopher Randolph, DOB 1946-06-10, MRN [id number]  PCP:  Curlene Labrum, MD  Cardiologist:  Rozann Lesches, MD Electrophysiologist:  None   Chief Complaint  Patient presents with  . Cardiac follow-up    History of Present Illness: Christopher Randolph is a 73 y.o. male last seen in March.  He presents for a routine visit.  Since last assessment he does not report any active angina symptoms on medical therapy, no nitroglycerin use.  He states that he has had some times when his blood pressure is low, systolics in the 0000000, he has held Micardis during those times.  Cannot associate any particular trigger, no diarrhea or dehydration.  Recent follow-up carotid Dopplers showed stable moderate bilateral ICA stenoses.  He remains asymptomatic.  He continues on aspirin and statin therapy.  He is due for follow-up lab work and examination by Dr. Pleas Koch.  He has had no intolerances to Lipitor.  Last echocardiogram was in December 2018 as outlined below.  Past Medical History:  Diagnosis Date  . Arthritis   . CAD (coronary artery disease)    Branch vessel and moderate RCA disease 2006  . Cancer ([place]) 1990   Melanoma Lower right Leg  . Carotid artery disease ([place])   . Diabetes ([place])   . Essential hypertension   . Hyperlipidemia   . Hypothyroidism   . Lyme disease   . PAD (peripheral artery disease) ([place])    Left common iliac stent 2004  . PONV (postoperative nausea and vomiting)     Past Surgical History:  Procedure Laterality Date  . CATARACT EXTRACTION Bilateral   . CATARACT EXTRACTION, BILATERAL    . CERVICAL DISC SURGERY    . COLONOSCOPY N/A 11/06/2016   Procedure: COLONOSCOPY;  Surgeon: Daneil Dolin, MD;  [facility];  Service: Endoscopy;  Laterality: N/A;  7:30 AM  . COLONOSCOPY  2012   Dr. Anthony Sar: normal. reviewed reports, which states he has a history of polyps in remote past.   . EYE SURGERY    . JOINT  REPLACEMENT    . LUMBAR WOUND DEBRIDEMENT N/A 01/24/2019   Procedure: LUMBAR WOUND Exploration for Evacation of Seroma vs. Hematoma;  Surgeon: Jovita Gamma, MD;  Location: [facility];  Service: Neurosurgery;  Laterality: N/A;  . MELANOMA EXCISION     right leg, [facility] and underwent immunotherapy  . NECK SURGERY    . REPLACEMENT TOTAL KNEE      Current Outpatient Medications  Medication Sig Dispense Refill  . aspirin EC 81 MG tablet Take 81 mg by mouth daily.      Marland Kitchen atorvastatin (LIPITOR) 20 MG tablet TAKE ONE TABLET BY MOUTH DAILY (Patient taking differently: Take 20 mg by mouth daily. ) 90 tablet 3  . brimonidine (ALPHAGAN) 0.2 % ophthalmic solution Place 1 drop into the left eye 2 (two) times daily. 10 mL 3  . brimonidine (ALPHAGAN) 0.2 % ophthalmic solution Place 1 drop into the left eye 3 (three) times daily. 10 mL 10  . dorzolamide-timolol (COSOPT) 22.3-6.8 MG/ML ophthalmic solution Place 1 drop into the left eye 2 (two) times daily. 10 mL 3  . fluticasone (FLONASE ALLERGY RELIEF) 50 MCG/ACT nasal spray Place 2 sprays into both nostrils daily.     Marland Kitchen levothyroxine (SYNTHROID, LEVOTHROID) 150 MCG tablet Take 150 mcg by mouth every other day.   1  . levothyroxine (SYNTHROID, LEVOTHROID) 175 MCG tablet Take 175 mcg  by mouth every other day.     . Loteprednol Etabonate (INVELTYS) 1 % SUSP Place 1 drop into the left eye 2 (two) times daily. (Patient taking differently: Place 1 drop into the left eye 2 (two) times a day. ) 2.8 mL 2  . Loteprednol Etabonate (INVELTYS) 1 % SUSP Apply 1 drop to eye 2 (two) times a day. 1 drop 2 times daily left eye 2.8 mL 10  . metFORMIN (GLUCOPHAGE) 500 MG tablet Take 500 mg by mouth daily.     . metoprolol tartrate (LOPRESSOR) 25 MG tablet Take 25 mg by mouth 2 (two) times daily.      . mometasone (ELOCON) 0.1 % ointment Apply 1 application topically 2 (two) times daily as needed (for psorasis).    . naproxen sodium (ANAPROX) 220 MG tablet Take 440 mg by  mouth daily as needed (for pain.).    Marland Kitchen nitroGLYCERIN (NITROSTAT) 0.4 MG SL tablet Place 1 tablet (0.4 mg total) under the tongue every 5 (five) minutes x 3 doses as needed for chest pain (if no relief after 3rd dose, proceed to the ED for an evaluation or call 911). 25 tablet 3  . tamsulosin (FLOMAX) 0.4 MG CAPS capsule Take 0.4 mg by mouth 2 (two) times daily.    Marland Kitchen telmisartan (MICARDIS) 80 MG tablet Take 1 tablet (80 mg total) by mouth daily. 90 tablet 3   No current facility-administered medications for this visit.    Allergies:  Azithromycin and Shrimp [shellfish allergy]   Social History: The patient  reports that he quit smoking about 15 years ago. His smoking use included cigarettes. He started smoking about 55 years ago. He has a 60.00 pack-year smoking history. He has never used smokeless tobacco. He reports current alcohol use. He reports that he does not use drugs.   ROS:  Please see the history of present illness. Otherwise, complete review of systems is positive for neuropathy symptoms.  All other systems are reviewed and negative.   Physical Exam: VS:  BP 129/69   Pulse 60   Temp (!) 97.5 F (36.4 C)   Ht 5\' 8"  (1.727 m)   Wt 186 lb (84.4 kg)   BMI 28.28 kg/m , BMI Body mass index is 28.28 kg/m.  Wt Readings from Last 3 Encounters:  09/14/19 186 lb (84.4 kg)  03/10/19 183 lb 6.4 oz (83.2 kg)  01/24/19 184 lb 10.9 oz (83.8 kg)    General: Patient appears comfortable at rest. HEENT: Conjunctiva and lids normal, wearing a mask. Neck: Supple, no elevated JVP or carotid bruits, no thyromegaly. Lungs: Clear to auscultation, nonlabored breathing at rest. Cardiac: Regular rate and rhythm, no S3, 2/6 systolic murmur, no pericardial rub. Abdomen: Soft, nontender, bowel sounds present. Extremities: No pitting edema, distal pulses 2+. Skin: Warm and dry. Musculoskeletal: No kyphosis. Neuropsychiatric: Alert and oriented x3, affect grossly appropriate.  ECG:  An ECG dated  01/11/2019 was personally reviewed today and demonstrated:  Sinus bradycardia with lead motion artifact and nonspecific T wave changes.  Recent Labwork: 01/24/2019: BUN 22; Creatinine, Ser 0.94; Hemoglobin 9.8; Platelets 151; Potassium 4.0; Sodium 136   Other Studies Reviewed Today:  Carotid Dopplers 09/06/2019: Summary: Right Carotid: Velocities in the right ICA are consistent with a 60-79%                stenosis.  Left Carotid: Velocities in the left ICA are consistent with a 40-59% stenosis.  High end of range, likely stable.  Vertebrals:  Bilateral vertebral arteries demonstrate antegrade flow. Subclavians: Normal flow hemodynamics were seen in bilateral subclavian              arteries.  Echocardiogram 12/06/2017 [facility]): Mild LVH with LVEF 55 to [id number], normal diastolic function, mild left atrial enlargement, normal right ventricular contraction, no major valvular abnormalities.  Assessment and Plan:  1.  CAD being managed medically, branch vessel and moderate RCA stenosis.  He does not report any progressive angina symptoms and will continue with current plan.  He is on aspirin and statin therapy.  No recent nitroglycerin use.  2.  Essential hypertension with fluctuating blood pressure by report.  Plan is to change to myocarditis without HCTZ component and continue to follow.  3.  Moderate, asymptomatic bilateral carotid artery disease.  Recent Dopplers reviewed.  Continue aspirin and statin.  4.  Mixed hyperlipidemia.  He remains on Lipitor and has follow-up pending with Dr. Pleas Koch.  Medication Adjustments/Labs and Tests Ordered: Current medicines are reviewed at length with the patient today.  Concerns regarding medicines are outlined above.   Tests Ordered: No orders of the defined types were placed in this encounter.   Medication Changes: Meds ordered this encounter  Medications  . telmisartan (MICARDIS) 80 MG tablet    Sig: Take 1 tablet (80  mg total) by mouth daily.    Dispense:  90 tablet    Refill:  3    Disposition:  Follow up 6 months in the [place] office.  Signed, Satira Sark, MD, [facility] 09/14/2019 1:53 PM    [place] [facility] [address], [place], [place] [address] Phone: [phone]; Fax: [phone]

## 2019-09-15 ENCOUNTER — Ambulatory Visit: Payer: Medicare Other | Admitting: Cardiology

## 2019-09-20 ENCOUNTER — Telehealth: Payer: Self-pay | Admitting: *Deleted

## 2019-09-20 NOTE — Telephone Encounter (Signed)
Patient informed. Copy sent to PCP °

## 2019-09-20 NOTE — Telephone Encounter (Signed)
-----   Message from Satira Sark, MD sent at 09/07/2019 12:55 PM EDT ----- Results reviewed. Overall moderate bilateral ICA stenosis. Continue medical therapy and repeat study in a year.

## 2019-09-25 ENCOUNTER — Encounter (INDEPENDENT_AMBULATORY_CARE_PROVIDER_SITE_OTHER): Payer: Medicare Other | Admitting: Ophthalmology

## 2019-09-27 NOTE — Progress Notes (Signed)
[facility] Note  10/02/2019     CHIEF COMPLAINT Patient presents for Retina Follow Up   HISTORY OF PRESENT ILLNESS: Christopher Randolph is a 73 y.o. male who presents to the clinic today for:   HPI    Retina Follow Up    Patient presents with  Other (UGH syndrome OS).  In left eye.  Severity is moderate.  Duration of 5 weeks.  Since onset it is gradually worsening.  I, the attending physician,  performed the HPI with the patient and updated documentation appropriately.          Comments    Patient states vision seems fuzzy OS. Using iveltys, cosopt, and brimonidine tid OS. BS was 141 this am. Last a1c was 7.1, checked 2 months ago.        Last edited by [name], MD on 10/02/2019 12:11 PM. (History)    Patient states his left eye vision is still blurry, he states he is still using the drops as directed    Referring physician: Curlene Labrum, MD [place],  [place] [address]  HISTORICAL INFORMATION:   Selected notes from the MEDICAL RECORD NUMBER Referred by Dr. [name] for concern of vitreous hemorrhage LEE: 05.21.19 [name]) [BCVA: OD: OS: ] Ocular Hx-Psedophakia OU,  PMH-DM (on Metformin), Arthritis     CURRENT MEDICATIONS: Current Outpatient Medications (Ophthalmic Drugs)  Medication Sig  . brimonidine (ALPHAGAN) 0.2 % ophthalmic solution Place 1 drop into the left eye 3 (three) times daily.  . dorzolamide-timolol (COSOPT) 22.3-6.8 MG/ML ophthalmic solution Place 1 drop into the left eye 2 (two) times daily. (Patient taking differently: Place 1 drop into the left eye 3 (three) times daily. )  . Loteprednol Etabonate (INVELTYS) 1 % SUSP Place 1 drop into the left eye 2 (two) times daily. (Patient taking differently: Place 1 drop into the left eye 2 (two) times a day. )  . Loteprednol Etabonate (INVELTYS) 1 % SUSP Apply 1 drop to eye 2 (two) times a day. 1 drop 2 times daily left eye (Patient taking differently: Apply 1 drop  to eye 3 (three) times daily. 1 drop 2 times daily left eye)  . brimonidine (ALPHAGAN) 0.2 % ophthalmic solution Place 1 drop into the left eye 2 (two) times daily.   No current facility-administered medications for this visit.  (Ophthalmic Drugs)   Current Outpatient Medications (Other)  Medication Sig  . aspirin EC 81 MG tablet Take 81 mg by mouth daily.    Marland Kitchen atorvastatin (LIPITOR) 20 MG tablet TAKE ONE TABLET BY MOUTH DAILY (Patient taking differently: Take 20 mg by mouth daily. )  . fluticasone (FLONASE ALLERGY RELIEF) 50 MCG/ACT nasal spray Place 2 sprays into both nostrils daily.   Marland Kitchen levothyroxine (SYNTHROID, LEVOTHROID) 150 MCG tablet Take 150 mcg by mouth every other day.   . levothyroxine (SYNTHROID, LEVOTHROID) 175 MCG tablet Take 175 mcg by mouth every other day.   . metFORMIN (GLUCOPHAGE) 500 MG tablet Take 500 mg by mouth daily.   . metoprolol tartrate (LOPRESSOR) 25 MG tablet Take 25 mg by mouth 2 (two) times daily.    . mometasone (ELOCON) 0.1 % ointment Apply 1 application topically 2 (two) times daily as needed (for psorasis).  . naproxen sodium (ANAPROX) 220 MG tablet Take 440 mg by mouth daily as needed (for pain.).  Marland Kitchen nitroGLYCERIN (NITROSTAT) 0.4 MG SL tablet Place 1 tablet (0.4 mg total) under the tongue every 5 (five)  minutes x 3 doses as needed for chest pain (if no relief after 3rd dose, proceed to the ED for an evaluation or call 911).  . tamsulosin (FLOMAX) 0.4 MG CAPS capsule Take 0.4 mg by mouth 2 (two) times daily.  Marland Kitchen telmisartan (MICARDIS) 80 MG tablet Take 1 tablet (80 mg total) by mouth daily.   No current facility-administered medications for this visit.  (Other)      REVIEW OF SYSTEMS: ROS    Positive for: Endocrine, Cardiovascular, Eyes   Negative for: Constitutional, Gastrointestinal, Neurological, Skin, Genitourinary, Musculoskeletal, HENT, Respiratory, Psychiatric, Allergic/Imm, Heme/Lymph   Last edited by [name], COT on 10/02/2019  9:17 AM.  (History)       ALLERGIES Allergies  Allergen Reactions  . Azithromycin Swelling  . Shrimp [Shellfish Allergy] Other (See Comments)    Gout flares    PAST MEDICAL HISTORY Past Medical History:  Diagnosis Date  . Arthritis   . CAD (coronary artery disease)    Branch vessel and moderate RCA disease 2006  . Cancer ([place]) 1990   Melanoma Lower right Leg  . Carotid artery disease ([place])   . Diabetes ([place])   . Essential hypertension   . Hyperlipidemia   . Hypothyroidism   . Lyme disease   . PAD (peripheral artery disease) ([place])    Left common iliac stent 2004  . PONV (postoperative nausea and vomiting)    Past Surgical History:  Procedure Laterality Date  . CATARACT EXTRACTION Bilateral   . CATARACT EXTRACTION, BILATERAL    . CERVICAL DISC SURGERY    . COLONOSCOPY N/A 11/06/2016   Procedure: COLONOSCOPY;  Surgeon: Daneil Dolin, MD;  Location[facility];  Service: Endoscopy;  Laterality: N/A;  7:30 AM  . COLONOSCOPY  2012   Dr. Anthony Sar: normal. reviewed reports, which states he has a history of polyps in remote past.   . EYE SURGERY    . JOINT REPLACEMENT    . LUMBAR WOUND DEBRIDEMENT N/A 01/24/2019   Procedure: LUMBAR WOUND Exploration for Evacation of Seroma vs. Hematoma;  Surgeon: Jovita Gamma, MD;  Location: [facility];  Service: Neurosurgery;  Laterality: N/A;  . MELANOMA EXCISION     right leg, [facility] and underwent immunotherapy  . NECK SURGERY    . REPLACEMENT TOTAL KNEE      FAMILY HISTORY Family History  Problem Relation Age of Onset  . Heart disease Mother   . Heart attack Mother   . Colon polyps Mother   . Heart attack Maternal Grandmother   . Heart attack Maternal Grandfather   . Colon polyps Brother   . Colon cancer Neg Hx     SOCIAL HISTORY Social History   Tobacco Use  . Smoking status: Former Smoker    Packs/day: 1.50    Years: 40.00    Pack years: 60.00    Types: Cigarettes    Start date: 11/04/1963    Quit date: 10/29/2003     Years since quitting: 15.9  . Smokeless tobacco: Never Used  Substance Use Topics  . Alcohol use: Yes    Alcohol/week: 0.0 standard drinks    Comment: one miller lite daily   . Drug use: No         OPHTHALMIC EXAM:  Base Eye Exam    Visual Acuity (Snellen - Linear)      Right Left   Dist cc 20/20 20/30   Dist ph cc  20/30 +2   Correction: Glasses  Tonometry (Tonopen, 9:29 AM)      Right Left   Pressure 18 21       Pupils      Dark Light Shape React APD   Right 3 2 Round Brisk None   Left 3 2 Round Brisk None       Visual Fields (Counting fingers)      Left Right    Full Full       Extraocular Movement      Right Left    Full, Ortho Full, Ortho       Neuro/Psych    Oriented x3: Yes   Mood/Affect: Normal       Dilation    Both eyes: 1.0% Mydriacyl, 2.5% Phenylephrine @ 9:28 AM        Slit Lamp and Fundus Exam    External Exam      Right Left   External brow ptosis brow ptosis; mild periorbital edema and erythema       Slit Lamp Exam      Right Left   Lids/Lashes Dermatochalasis - upper lid, Telangiectasia, mild Meibomian gland dysfunction Dermatochalasis - upper lid, Meibomian gland dysfunction, Telangiectasia; mild erythema temporal upper lid   Conjunctiva/Sclera superior and temporal Pinguecula nasal and temporal Pinguecula, mild Conjunctivochalasis inferiorly   Cornea mild arcus, 1+ Punctate epithelial erosions mild arcus, well healed cataract wounds, 1+ Punctate epithelial erosions and haze   Anterior Chamber Deep and quiet, no cell/flare Deep, 0.5+pigment   Iris No NVI, Round and dilated No NVI, moderately dilated to 4, Transillumination defects from 0100-0200 with IOL haptic visible within - stable   Lens Posterior chamber intraocular lens in good position Posterior chamber intraocular lens in good position   Vitreous vitreous syneresis Vitreous syneresis; no heme       Fundus Exam      Right Left   Disc Pink and Sharp Pink and Sharp    C/D Ratio 0.2 0.3   Macula Flat,  mild RPE mottling, No heme or edema Flat, mild RPE mottling, No heme or edema   Vessels normal, mildly Tortuous Vascular attenuation, Tortuous   Periphery Attached, round focal area of CR atrophy at 0630 mid zone Attached        Refraction    Wearing Rx      Sphere Cylinder Axis   Right +0.50 Sphere    Left -0.25 +0.25 037   Type: Trifocal       Manifest Refraction      Sphere Cylinder Axis Dist VA   Right       Left -0.50 +0.50 040 20/30          IMAGING AND PROCEDURES  Imaging and Procedures for @TODAY @  OCT, Retina - OU - Both Eyes       Right Eye Quality was good. Central Foveal Thickness: 272. Progression has been stable. Findings include normal foveal contour, no SRF, no IRF (partial PVD - stable).   Left Eye Quality was good. Central Foveal Thickness: 269. Progression has been stable. Findings include normal foveal contour, no IRF, no SRF (Partial PVD  - stable).   Notes *Images captured and stored on drive  Diagnosis / Impression:  NFP, no IRF, no SRF OU Partial PVD OU stable  Clinical management:  See below  Abbreviations: NFP - Normal foveal profile. CME - cystoid macular edema. PED - pigment epithelial detachment. IRF - intraretinal fluid. SRF - subretinal fluid. EZ - ellipsoid zone. ERM - epiretinal membrane. ORA - outer  retinal atrophy. ORT - outer retinal tubulation. 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 Uveitis-hyphema-glaucoma syndrome of left eye  T85.398A    H20.9    H40.42X0   3. Pigmentary glaucoma of left eye, mild stage  H40.1321   4. Retinal edema  H35.81 OCT, Retina - OU - Both Eyes  5. Pseudophakia of both eyes  Z96.1   6. Brow ptosis  H57.819   7. Dermatochalasis of both upper eyelids  H02.831    H02.834    1-3. UGH Syndrome OS  - 3 piece PCIOL OS centered, but iris has large TID in sup temp quadrant  with IOL haptic within area  - d/c'd Inveltys on 10/10/18 but had flare up on 11/29/18 and was restarted  - currently on Inveltys TID, Cosopt TID, and brimonidine TID OS  - AC with 0.5+pigment today -- decreased from prior   - IOP at 21 today on cosopt bid and brimonidine  - gonio at previous visit shows open angles OS, mild PAS  - exam today shows some periorbital edema and erythema OS + conjunctivochalasis -- ?mild medicamentosa  - decrease inveltys to bid OS   - decrease cosopt to bid OS   - decrease brimonidine to bid OS   - f/u 4 weeks for recheck of IOP and DFE  4. No retinal edema on exam or OCT  5. Pseudophakia OU  - s/p CE/IOL OU -- pt can't remember dates, one eye by [facility], other eye by [facility]  - likely UGH OS as above  - PCIOL OD appears to be in good position  - monitor  6. Lyme disease  - history of diagnosis from target lesions on lower extremities  - no blood test confirmation performed  - completed doxycycline per PCP office  - no manifestation in the eyes, but will continue to monitor  7,8. Brow ptosis, dermatochalasis OU  -  referred to [facility] for eyelid eval (Dr. Vickki Muff)     Ophthalmic Meds Ordered this visit:  No orders of the defined types were placed in this encounter.      Return in about 4 weeks (around 10/30/2019) for f/u UGH syndrome OS, DFE, OCT.  There are no Patient Instructions on file for this visit.   Explained the diagnoses, plan, and follow up with the patient and they expressed understanding.  Patient expressed understanding of the importance of proper follow up care.   This document serves as a record of services personally performed by Gardiner Sleeper, MD, PhD. It was created on their behalf by Ernest Mallick, OA, an ophthalmic assistant. The creation of this record is the provider's dictation and/or activities during the visit.    Electronically signed by: Ernest Mallick, OA 10.05.2020 12:13 PM   Gardiner Sleeper, M.D.,  Ph.D. Diseases & Surgery of the Retina and Vitreous [facility]  I have reviewed the above documentation for accuracy and completeness, and I agree with the above. Gardiner Sleeper, M.D., Ph.D. 10/02/19 12:13 PM    Abbreviations: M myopia (nearsighted); A astigmatism; H hyperopia (farsighted); P presbyopia; Mrx spectacle prescription;  CTL contact lenses; OD right eye; OS left eye; OU both eyes  XT exotropia; ET esotropia; PEK punctate epithelial keratitis; PEE punctate epithelial erosions; DES dry eye syndrome; MGD meibomian gland dysfunction; ATs artificial  tears; PFAT's preservative free artificial tears; [place] nuclear sclerotic cataract; PSC posterior subcapsular cataract; ERM epi-retinal membrane; PVD posterior vitreous detachment; RD retinal detachment; DM diabetes mellitus; DR diabetic retinopathy; NPDR non-proliferative diabetic retinopathy; PDR proliferative diabetic retinopathy; CSME clinically significant macular edema; DME diabetic macular edema; dbh dot blot hemorrhages; CWS cotton wool spot; POAG primary open angle glaucoma; C/D cup-to-disc ratio; HVF humphrey visual field; GVF goldmann visual field; OCT optical coherence tomography; IOP intraocular pressure; BRVO Branch retinal vein occlusion; CRVO central retinal vein occlusion; CRAO central retinal artery occlusion; BRAO branch retinal artery occlusion; RT retinal tear; SB scleral buckle; PPV pars plana vitrectomy; VH Vitreous hemorrhage; PRP panretinal laser photocoagulation; IVK intravitreal kenalog; VMT vitreomacular traction; MH Macular hole;  NVD neovascularization of the disc; NVE neovascularization elsewhere; AREDS age related eye disease study; ARMD age related macular degeneration; POAG primary open angle glaucoma; EBMD epithelial/anterior basement membrane dystrophy; ACIOL anterior chamber intraocular lens; IOL intraocular lens; PCIOL posterior chamber intraocular lens; Phaco/IOL phacoemulsification with  intraocular lens placement; [place] photorefractive keratectomy; LASIK laser assisted in situ keratomileusis; HTN hypertension; DM diabetes mellitus; COPD chronic obstructive pulmonary disease

## 2019-10-02 ENCOUNTER — Encounter (INDEPENDENT_AMBULATORY_CARE_PROVIDER_SITE_OTHER): Payer: Self-pay | Admitting: Ophthalmology

## 2019-10-02 ENCOUNTER — Ambulatory Visit (INDEPENDENT_AMBULATORY_CARE_PROVIDER_SITE_OTHER): Payer: Medicare Other | Admitting: Ophthalmology

## 2019-10-02 ENCOUNTER — Other Ambulatory Visit: Payer: Self-pay

## 2019-10-02 DIAGNOSIS — H02831 Dermatochalasis of right upper eyelid: Secondary | ICD-10-CM

## 2019-10-02 DIAGNOSIS — Z961 Presence of intraocular lens: Secondary | ICD-10-CM | POA: Diagnosis not present

## 2019-10-02 DIAGNOSIS — H3581 Retinal edema: Secondary | ICD-10-CM

## 2019-10-02 DIAGNOSIS — H209 Unspecified iridocyclitis: Secondary | ICD-10-CM

## 2019-10-02 DIAGNOSIS — H57819 Brow ptosis, unspecified: Secondary | ICD-10-CM

## 2019-10-02 DIAGNOSIS — H02834 Dermatochalasis of left upper eyelid: Secondary | ICD-10-CM

## 2019-10-02 DIAGNOSIS — H401321 Pigmentary glaucoma, left eye, mild stage: Secondary | ICD-10-CM

## 2019-10-02 DIAGNOSIS — H4042X0 Glaucoma secondary to eye inflammation, left eye, stage unspecified: Secondary | ICD-10-CM

## 2019-10-02 DIAGNOSIS — T85398A Other mechanical complication of other ocular prosthetic devices, implants and grafts, initial encounter: Secondary | ICD-10-CM

## 2019-10-10 DIAGNOSIS — I10 Essential (primary) hypertension: Secondary | ICD-10-CM | POA: Diagnosis not present

## 2019-10-10 DIAGNOSIS — I779 Disorder of arteries and arterioles, unspecified: Secondary | ICD-10-CM | POA: Diagnosis not present

## 2019-10-10 DIAGNOSIS — I25119 Atherosclerotic heart disease of native coronary artery with unspecified angina pectoris: Secondary | ICD-10-CM | POA: Diagnosis not present

## 2019-10-10 DIAGNOSIS — Z6829 Body mass index (BMI) 29.0-29.9, adult: Secondary | ICD-10-CM | POA: Diagnosis not present

## 2019-10-10 DIAGNOSIS — M10072 Idiopathic gout, left ankle and foot: Secondary | ICD-10-CM | POA: Diagnosis not present

## 2019-10-30 ENCOUNTER — Other Ambulatory Visit: Payer: Self-pay

## 2019-10-30 ENCOUNTER — Ambulatory Visit (INDEPENDENT_AMBULATORY_CARE_PROVIDER_SITE_OTHER): Payer: Medicare Other | Admitting: Ophthalmology

## 2019-10-30 ENCOUNTER — Encounter (INDEPENDENT_AMBULATORY_CARE_PROVIDER_SITE_OTHER): Payer: Self-pay | Admitting: Ophthalmology

## 2019-10-30 DIAGNOSIS — H3581 Retinal edema: Secondary | ICD-10-CM

## 2019-10-30 DIAGNOSIS — Z961 Presence of intraocular lens: Secondary | ICD-10-CM

## 2019-10-30 DIAGNOSIS — A6920 Lyme disease, unspecified: Secondary | ICD-10-CM | POA: Diagnosis not present

## 2019-10-30 DIAGNOSIS — H209 Unspecified iridocyclitis: Secondary | ICD-10-CM | POA: Diagnosis not present

## 2019-10-30 DIAGNOSIS — H02831 Dermatochalasis of right upper eyelid: Secondary | ICD-10-CM | POA: Diagnosis not present

## 2019-10-30 DIAGNOSIS — H4042X0 Glaucoma secondary to eye inflammation, left eye, stage unspecified: Secondary | ICD-10-CM | POA: Diagnosis not present

## 2019-10-30 DIAGNOSIS — H57819 Brow ptosis, unspecified: Secondary | ICD-10-CM

## 2019-10-30 DIAGNOSIS — H02834 Dermatochalasis of left upper eyelid: Secondary | ICD-10-CM

## 2019-10-30 DIAGNOSIS — H401321 Pigmentary glaucoma, left eye, mild stage: Secondary | ICD-10-CM

## 2019-10-30 NOTE — Progress Notes (Signed)
[facility] Note  10/30/2019     CHIEF COMPLAINT Patient presents for Retina Follow Up   HISTORY OF PRESENT ILLNESS: Christopher Randolph is a 73 y.o. male who presents to the clinic today for:   HPI    Retina Follow Up    Patient presents with  Other.  In left eye.  This started 4 weeks ago.  Severity is moderate.  I, the attending physician,  performed the HPI with the patient and updated documentation appropriately.          Comments    Patient here for 4 weeks follow up for UGH OS. Patient states vision doing OK. No eye pain.        Last edited by [name], MD on 10/30/2019 12:26 PM. (History)    Patient states he is doing well, he states he has not had any flare ups since last visit, he states he did not use Inveltys this morning bc he is out, he is getting a refill today    Referring physician: Myrtha Mantis, [place] [address]  HISTORICAL INFORMATION:   Selected notes from the MEDICAL RECORD NUMBER Referred by Dr. [name] for concern of vitreous hemorrhage LEE: 05.21.19 [name]) [BCVA: OD: OS: ] Ocular Hx-Psedophakia OU,  PMH-DM (on Metformin), Arthritis     CURRENT MEDICATIONS: Current Outpatient Medications (Ophthalmic Drugs)  Medication Sig  . brimonidine (ALPHAGAN) 0.2 % ophthalmic solution Place 1 drop into the left eye 2 (two) times daily.  . brimonidine (ALPHAGAN) 0.2 % ophthalmic solution Place 1 drop into the left eye 3 (three) times daily.  . dorzolamide-timolol (COSOPT) 22.3-6.8 MG/ML ophthalmic solution Place 1 drop into the left eye 2 (two) times daily. (Patient taking differently: Place 1 drop into the left eye 3 (three) times daily. )  . Loteprednol Etabonate (INVELTYS) 1 % SUSP Place 1 drop into the left eye 2 (two) times daily. (Patient taking differently: Place 1 drop into the left eye 2 (two) times a day. )  . Loteprednol Etabonate (INVELTYS) 1 % SUSP Apply 1 drop to eye 2 (two)  times a day. 1 drop 2 times daily left eye (Patient taking differently: Apply 1 drop to eye 3 (three) times daily. 1 drop 2 times daily left eye)   No current facility-administered medications for this visit.  (Ophthalmic Drugs)   Current Outpatient Medications (Other)  Medication Sig  . aspirin EC 81 MG tablet Take 81 mg by mouth daily.    Marland Kitchen atorvastatin (LIPITOR) 20 MG tablet TAKE ONE TABLET BY MOUTH DAILY (Patient taking differently: Take 20 mg by mouth daily. )  . fluticasone (FLONASE ALLERGY RELIEF) 50 MCG/ACT nasal spray Place 2 sprays into both nostrils daily.   Marland Kitchen levothyroxine (SYNTHROID, LEVOTHROID) 150 MCG tablet Take 150 mcg by mouth every other day.   . levothyroxine (SYNTHROID, LEVOTHROID) 175 MCG tablet Take 175 mcg by mouth every other day.   . metFORMIN (GLUCOPHAGE) 500 MG tablet Take 500 mg by mouth daily.   . metoprolol tartrate (LOPRESSOR) 25 MG tablet Take 25 mg by mouth 2 (two) times daily.    . mometasone (ELOCON) 0.1 % ointment Apply 1 application topically 2 (two) times daily as needed (for psorasis).  . naproxen sodium (ANAPROX) 220 MG tablet Take 440 mg by mouth daily as needed (for pain.).  Marland Kitchen nitroGLYCERIN (NITROSTAT) 0.4 MG SL tablet Place 1 tablet (0.4 mg total) under the tongue every 5 (five) minutes  x 3 doses as needed for chest pain (if no relief after 3rd dose, proceed to the ED for an evaluation or call 911).  . tamsulosin (FLOMAX) 0.4 MG CAPS capsule Take 0.4 mg by mouth 2 (two) times daily.  Marland Kitchen telmisartan (MICARDIS) 80 MG tablet Take 1 tablet (80 mg total) by mouth daily.   No current facility-administered medications for this visit.  (Other)      REVIEW OF SYSTEMS: ROS    Positive for: Endocrine, Cardiovascular, Eyes   Negative for: Constitutional, Gastrointestinal, Neurological, Skin, Genitourinary, Musculoskeletal, HENT, Respiratory, Psychiatric, Allergic/Imm, Heme/Lymph   Last edited by [name], COA on 10/30/2019  8:58 AM. (History)        ALLERGIES Allergies  Allergen Reactions  . Azithromycin Swelling  . Shrimp [Shellfish Allergy] Other (See Comments)    Gout flares    PAST MEDICAL HISTORY Past Medical History:  Diagnosis Date  . Arthritis   . CAD (coronary artery disease)    Branch vessel and moderate RCA disease 2006  . Cancer ([place]) 1990   Melanoma Lower right Leg  . Carotid artery disease ([place])   . Diabetes ([place])   . Essential hypertension   . Hyperlipidemia   . Hypothyroidism   . Lyme disease   . PAD (peripheral artery disease) ([place])    Left common iliac stent 2004  . PONV (postoperative nausea and vomiting)    Past Surgical History:  Procedure Laterality Date  . CATARACT EXTRACTION Bilateral   . CATARACT EXTRACTION, BILATERAL    . CERVICAL DISC SURGERY    . COLONOSCOPY N/A 11/06/2016   Procedure: COLONOSCOPY;  Surgeon: Daneil Dolin, MD;  Location: [facility];  Service: Endoscopy;  Laterality: N/A;  7:30 AM  . COLONOSCOPY  2012   Dr. Anthony Sar: normal. reviewed reports, which states he has a history of polyps in remote past.   . EYE SURGERY    . JOINT REPLACEMENT    . LUMBAR WOUND DEBRIDEMENT N/A 01/24/2019   Procedure: LUMBAR WOUND Exploration for Evacation of Seroma vs. Hematoma;  Surgeon: Jovita Gamma, MD;  Location: [facility];  Service: Neurosurgery;  Laterality: N/A;  . MELANOMA EXCISION     right leg, seen at [facility] and underwent immunotherapy  . NECK SURGERY    . REPLACEMENT TOTAL KNEE      FAMILY HISTORY Family History  Problem Relation Age of Onset  . Heart disease Mother   . Heart attack Mother   . Colon polyps Mother   . Heart attack Maternal Grandmother   . Heart attack Maternal Grandfather   . Colon polyps Brother   . Colon cancer Neg Hx     SOCIAL HISTORY Social History   Tobacco Use  . Smoking status: Former Smoker    Packs/day: 1.50    Years: 40.00    Pack years: 60.00    Types: Cigarettes    Start date: 11/04/1963    Quit date: 10/29/2003    Years since  quitting: 16.0  . Smokeless tobacco: Never Used  Substance Use Topics  . Alcohol use: Yes    Alcohol/week: 0.0 standard drinks    Comment: one miller lite daily   . Drug use: No         OPHTHALMIC EXAM:  Base Eye Exam    Visual Acuity (Snellen - Linear)      Right Left   Dist cc 20/20 -1 20/25   Dist ph cc  NI   Correction: Glasses  Tonometry (Tonopen, 8:56 AM)      Right Left   Pressure 21 22       Pupils      Dark Light Shape React APD   Right 3 2 Round Brisk None   Left 3 2 Round Brisk None       Visual Fields (Counting fingers)      Left Right    Full Full       Extraocular Movement      Right Left    Full, Ortho Full, Ortho       Neuro/Psych    Oriented x3: Yes   Mood/Affect: Normal       Dilation    Both eyes: 1.0% Mydriacyl, 2.5% Phenylephrine @ 8:56 AM        Slit Lamp and Fundus Exam    External Exam      Right Left   External brow ptosis brow ptosis; mild periorbital edema and erythema       Slit Lamp Exam      Right Left   Lids/Lashes Dermatochalasis - upper lid, Telangiectasia, mild Meibomian gland dysfunction Dermatochalasis - upper lid, Meibomian gland dysfunction, Telangiectasia; mild erythema temporal upper lid   Conjunctiva/Sclera superior and temporal Pinguecula nasal and temporal Pinguecula, mild Conjunctivochalasis inferiorly   Cornea mild arcus, 1+ Punctate epithelial erosions mild arcus, well healed cataract wounds, 1+ Punctate epithelial erosions and haze   Anterior Chamber Deep and quiet, no cell/flare Deep, 0.5+pigment   Iris No NVI, Round and dilated No NVI, moderately dilated to 4, Transillumination defects from 0100-0200 with IOL haptic visible within - stable   Lens Posterior chamber intraocular lens in good position Posterior chamber intraocular lens in good position   Vitreous vitreous syneresis Vitreous syneresis; no heme       Fundus Exam      Right Left   Disc Pink and Sharp Pink and Sharp   C/D Ratio 0.2  0.3   Macula Flat, mild RPE mottling, No heme or edema Flat, blunted foveal reflex, mild RPE mottling, No heme or edema   Vessels normal, mildly Tortuous Vascular attenuation, Tortuous   Periphery Attached, round focal area of CR atrophy at 0630 mid zone Attached        Refraction    Wearing Rx      Sphere Cylinder Axis   Right +0.50 Sphere    Left -0.25 +0.25 037   Type: Trifocal          IMAGING AND PROCEDURES  Imaging and Procedures for @TODAY @  OCT, Retina - OU - Both Eyes       Right Eye Quality was good. Central Foveal Thickness: 276. Progression has been stable. Findings include normal foveal contour, no SRF, no IRF (partial PVD - stable).   Left Eye Quality was good. Central Foveal Thickness: 270. Progression has been stable. Findings include normal foveal contour, no IRF, no SRF (Partial PVD  - stable).   Notes *Images captured and stored on drive  Diagnosis / Impression:  NFP, no IRF, no SRF OU Partial PVD OU stable  Clinical management:  See below  Abbreviations: NFP - Normal foveal profile. CME - cystoid macular edema. PED - pigment epithelial detachment. IRF - intraretinal fluid. SRF - subretinal fluid. EZ - ellipsoid zone. ERM - epiretinal membrane. ORA - outer retinal atrophy. ORT - outer retinal tubulation. 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 Uveitis-hyphema-glaucoma syndrome of left eye  T85.398A    H20.9    H40.42X0   3. Pigmentary glaucoma of left eye, mild stage  H40.1321   4. Retinal edema  H35.81 OCT, Retina - OU - Both Eyes  5. Pseudophakia of both eyes  Z96.1   6. Brow ptosis  H57.819   7. Dermatochalasis of both upper eyelids  H02.831    H02.834   8. Lyme disease  A69.20    1-3. UGH Syndrome OS  - 3 piece PCIOL OS centered, but iris has large TID in sup temp quadrant with IOL haptic within area  - d/c'd Inveltys on 10/10/18 but had  flare up on 11/29/18 and was restarted  - currently on Inveltys BID, Cosopt BID, and brimonidine BID OS  - AC with 0.5+pigment today -- stable -- likely new baseline   - IOP at 22 today on cosopt and brimonidine BID OS  - gonio at previous visit shows open angles OS, mild PAS  - cont inveltys to bid OS   - cont cosopt to bid OS   - increase brimonidine to tid OS   - f/u 3 months for recheck of IOP and DFE  4. No retinal edema on exam or OCT  5. Pseudophakia OU  - s/p CE/IOL OU -- pt can't remember dates, one eye by [facility], other eye by [place]  - likely UGH OS as above  - PCIOL OD appears to be in good position  - monitor  6. Lyme disease  - history of diagnosis from target lesions on lower extremities  - no blood test confirmation performed  - completed doxycycline per PCP office  - no manifestation in the eyes, but will continue to monitor  7,8. Brow ptosis, dermatochalasis OU  - will refer to Dr. Vickki Muff (now at [facility]) for eyelid eval     Ophthalmic Meds Ordered this visit:  No orders of the defined types were placed in this encounter.      Return in about 3 months (around 01/30/2020) for f/u UGH syndrome OS, DFE, OCT.  There are no Patient Instructions on file for this visit.   Explained the diagnoses, plan, and follow up with the patient and they expressed understanding.  Patient expressed understanding of the importance of proper follow up care.   This document serves as a record of services personally performed by Gardiner Sleeper, MD, PhD. It was created on their behalf by Ernest Mallick, OA, an ophthalmic assistant. The creation of this record is the provider's dictation and/or activities during the visit.    Electronically signed by: Ernest Mallick, OA 11.02.2020 12:29 PM   Gardiner Sleeper, M.D., Ph.D. Diseases & Surgery of the Retina and Vitreous [facility]  I have reviewed the above documentation for accuracy and completeness,  and I agree with the above. Gardiner Sleeper, M.D., Ph.D. 10/30/19 12:29 PM    Abbreviations: M myopia (nearsighted); A astigmatism; H hyperopia (farsighted); P presbyopia; Mrx spectacle prescription;  CTL contact lenses; OD right eye; OS left eye; OU both eyes  XT exotropia; ET esotropia; PEK punctate epithelial keratitis; PEE punctate epithelial erosions; DES dry eye syndrome; MGD meibomian gland dysfunction; ATs artificial tears; PFAT's preservative free artificial tears; [place] nuclear sclerotic cataract; PSC posterior subcapsular cataract; ERM epi-retinal membrane; PVD posterior vitreous detachment; RD retinal detachment; DM diabetes mellitus; DR diabetic retinopathy; NPDR non-proliferative diabetic retinopathy; PDR proliferative  diabetic retinopathy; CSME clinically significant macular edema; DME diabetic macular edema; dbh dot blot hemorrhages; CWS cotton wool spot; POAG primary open angle glaucoma; C/D cup-to-disc ratio; HVF humphrey visual field; GVF goldmann visual field; OCT optical coherence tomography; IOP intraocular pressure; BRVO Branch retinal vein occlusion; CRVO central retinal vein occlusion; CRAO central retinal artery occlusion; BRAO branch retinal artery occlusion; RT retinal tear; SB scleral buckle; PPV pars plana vitrectomy; VH Vitreous hemorrhage; PRP panretinal laser photocoagulation; IVK intravitreal kenalog; VMT vitreomacular traction; MH Macular hole;  NVD neovascularization of the disc; NVE neovascularization elsewhere; AREDS age related eye disease study; ARMD age related macular degeneration; POAG primary open angle glaucoma; EBMD epithelial/anterior basement membrane dystrophy; ACIOL anterior chamber intraocular lens; IOL intraocular lens; PCIOL posterior chamber intraocular lens; Phaco/IOL phacoemulsification with intraocular lens placement; [place] photorefractive keratectomy; LASIK laser assisted in situ keratomileusis; HTN hypertension; DM diabetes mellitus; COPD chronic  obstructive pulmonary disease

## 2019-11-01 DIAGNOSIS — I10 Essential (primary) hypertension: Secondary | ICD-10-CM | POA: Diagnosis not present

## 2019-11-01 DIAGNOSIS — E119 Type 2 diabetes mellitus without complications: Secondary | ICD-10-CM | POA: Diagnosis not present

## 2019-11-01 DIAGNOSIS — E039 Hypothyroidism, unspecified: Secondary | ICD-10-CM | POA: Diagnosis not present

## 2019-11-01 DIAGNOSIS — J439 Emphysema, unspecified: Secondary | ICD-10-CM | POA: Diagnosis not present

## 2019-11-01 DIAGNOSIS — E782 Mixed hyperlipidemia: Secondary | ICD-10-CM | POA: Diagnosis not present

## 2019-11-03 DIAGNOSIS — I25119 Atherosclerotic heart disease of native coronary artery with unspecified angina pectoris: Secondary | ICD-10-CM | POA: Diagnosis not present

## 2019-11-03 DIAGNOSIS — E039 Hypothyroidism, unspecified: Secondary | ICD-10-CM | POA: Diagnosis not present

## 2019-11-03 DIAGNOSIS — I10 Essential (primary) hypertension: Secondary | ICD-10-CM | POA: Diagnosis not present

## 2019-11-03 DIAGNOSIS — Z6829 Body mass index (BMI) 29.0-29.9, adult: Secondary | ICD-10-CM | POA: Diagnosis not present

## 2019-11-03 DIAGNOSIS — R011 Cardiac murmur, unspecified: Secondary | ICD-10-CM | POA: Diagnosis not present

## 2019-11-03 DIAGNOSIS — I779 Disorder of arteries and arterioles, unspecified: Secondary | ICD-10-CM | POA: Diagnosis not present

## 2019-11-03 DIAGNOSIS — Z87891 Personal history of nicotine dependence: Secondary | ICD-10-CM | POA: Diagnosis not present

## 2019-11-03 DIAGNOSIS — M10072 Idiopathic gout, left ankle and foot: Secondary | ICD-10-CM | POA: Diagnosis not present

## 2019-11-20 ENCOUNTER — Other Ambulatory Visit (HOSPITAL_COMMUNITY): Payer: Self-pay | Admitting: Cardiology

## 2019-11-20 DIAGNOSIS — I6523 Occlusion and stenosis of bilateral carotid arteries: Secondary | ICD-10-CM

## 2019-12-08 DIAGNOSIS — Z6829 Body mass index (BMI) 29.0-29.9, adult: Secondary | ICD-10-CM | POA: Diagnosis not present

## 2019-12-08 DIAGNOSIS — I10 Essential (primary) hypertension: Secondary | ICD-10-CM | POA: Diagnosis not present

## 2019-12-08 DIAGNOSIS — M10071 Idiopathic gout, right ankle and foot: Secondary | ICD-10-CM | POA: Diagnosis not present

## 2019-12-08 DIAGNOSIS — E1151 Type 2 diabetes mellitus with diabetic peripheral angiopathy without gangrene: Secondary | ICD-10-CM | POA: Diagnosis not present

## 2019-12-17 ENCOUNTER — Other Ambulatory Visit: Payer: Self-pay | Admitting: Cardiology

## 2020-01-01 DIAGNOSIS — R05 Cough: Secondary | ICD-10-CM | POA: Diagnosis not present

## 2020-01-01 DIAGNOSIS — U071 COVID-19: Secondary | ICD-10-CM | POA: Diagnosis not present

## 2020-01-23 NOTE — Progress Notes (Signed)
[facility] Note  01/29/2020     CHIEF COMPLAINT Patient presents for Retina Follow Up   HISTORY OF PRESENT ILLNESS: Christopher Randolph is a 74 y.o. male who presents to the clinic today for:   HPI    Retina Follow Up    Patient presents with  Other.  In left eye.  This started months ago.  Severity is moderate.  Duration of 3 months.  Since onset it is stable.  I, the attending physician,  performed the HPI with the patient and updated documentation appropriately.          Comments    74 y/o male pt here for 3 mo f/u for UGH syndrome OS.  No change in [place] OU.  [place] OU got very blurred about 3 wks ago when pt had a bout with Covid-19, but has since cleared.  Denies pain, flashes, floaters.  Inveltys BID OS Cosopt BID OS Brimonidine TID OS       Last edited by [name], MD on 01/29/2020  9:42 AM. (History)    Patient states he has been doing well, he had one instance with his vision due to sinuses, but it has resolved, pt states he went a couple days without his Inveltys bc the pharmacy was out of it, pt states he had a "mild" bout with covid about 5 weeks ago   Referring physician: Myrtha Mantis, [place] [address]  HISTORICAL INFORMATION:   Selected notes from the MEDICAL RECORD NUMBER Referred by Dr. [name] for concern of vitreous hemorrhage LEE: 05.21.19 [name]) [BCVA: OD: OS: ] Ocular Hx-Psedophakia OU,  PMH-DM (on Metformin), Arthritis     CURRENT MEDICATIONS: Current Outpatient Medications (Ophthalmic Drugs)  Medication Sig  . brimonidine (ALPHAGAN) 0.2 % ophthalmic solution Place 1 drop into the left eye 3 (three) times daily.  . Loteprednol Etabonate (INVELTYS) 1 % SUSP Place 1 drop into the left eye 2 (two) times daily. (Patient taking differently: Place 1 drop into the left eye 2 (two) times a day. )  . Loteprednol Etabonate (INVELTYS) 1 % SUSP Apply 1 drop to eye 2 (two) times a day. 1 drop 2  times daily left eye (Patient taking differently: Apply 1 drop to eye 3 (three) times daily. 1 drop 2 times daily left eye)   No current facility-administered medications for this visit. (Ophthalmic Drugs)   Current Outpatient Medications (Other)  Medication Sig  . aspirin EC 81 MG tablet Take 81 mg by mouth daily.    Marland Kitchen atorvastatin (LIPITOR) 20 MG tablet TAKE 1 TABLET BY MOUTH DAILY  . fluticasone (FLONASE ALLERGY RELIEF) 50 MCG/ACT nasal spray Place 2 sprays into both nostrils daily.   Marland Kitchen levothyroxine (SYNTHROID, LEVOTHROID) 150 MCG tablet Take 150 mcg by mouth every other day.   . levothyroxine (SYNTHROID, LEVOTHROID) 175 MCG tablet Take 175 mcg by mouth every other day.   . metFORMIN (GLUCOPHAGE) 500 MG tablet Take 500 mg by mouth daily.   . metoprolol tartrate (LOPRESSOR) 25 MG tablet Take 25 mg by mouth 2 (two) times daily.    . mometasone (ELOCON) 0.1 % ointment Apply 1 application topically 2 (two) times daily as needed (for psorasis).  . naproxen sodium (ANAPROX) 220 MG tablet Take 440 mg by mouth daily as needed (for pain.).  Marland Kitchen nitroGLYCERIN (NITROSTAT) 0.4 MG SL tablet Place 1 tablet (0.4 mg total) under the tongue every 5 (five) minutes x 3 doses as  needed for chest pain (if no relief after 3rd dose, proceed to the ED for an evaluation or call 911).  . tamsulosin (FLOMAX) 0.4 MG CAPS capsule Take 0.4 mg by mouth 2 (two) times daily.  Marland Kitchen telmisartan (MICARDIS) 80 MG tablet Take 1 tablet (80 mg total) by mouth daily.   No current facility-administered medications for this visit. (Other)      REVIEW OF SYSTEMS: ROS    Positive for: Musculoskeletal, Cardiovascular, Eyes   Negative for: Constitutional, Gastrointestinal, Neurological, Skin, Genitourinary, HENT, Endocrine, Respiratory, Psychiatric, Allergic/Imm, Heme/Lymph   Last edited by Matthew Folks, COA on 01/29/2020  9:17 AM. (History)       ALLERGIES Allergies  Allergen Reactions  . Azithromycin Swelling  . Shrimp  [Shellfish Allergy] Other (See Comments)    Gout flares    PAST MEDICAL HISTORY Past Medical History:  Diagnosis Date  . Arthritis   . CAD (coronary artery disease)    Branch vessel and moderate RCA disease 2006  . Cancer ([place]) 1990   Melanoma Lower right Leg  . Carotid artery disease ([place])   . Diabetes ([place])   . Essential hypertension   . Hyperlipidemia   . Hypothyroidism   . Lyme disease   . PAD (peripheral artery disease) ([place])    Left common iliac stent 2004  . PONV (postoperative nausea and vomiting)    Past Surgical History:  Procedure Laterality Date  . CATARACT EXTRACTION Bilateral   . CATARACT EXTRACTION, BILATERAL    . CERVICAL DISC SURGERY    . COLONOSCOPY N/A 11/06/2016   Procedure: COLONOSCOPY;  Surgeon: Daneil Dolin, MD;  [facility];  Service: Endoscopy;  Laterality: N/A;  7:30 AM  . COLONOSCOPY  2012   Dr. Anthony Sar: normal. reviewed reports, which states he has a history of polyps in remote past.   . EYE SURGERY    . JOINT REPLACEMENT    . LUMBAR WOUND DEBRIDEMENT N/A 01/24/2019   Procedure: LUMBAR WOUND Exploration for Evacation of Seroma vs. Hematoma;  Surgeon: Jovita Gamma, MD;  Location: [place];  Service: Neurosurgery;  Laterality: N/A;  . MELANOMA EXCISION     right leg, [facility] at [facility] and underwent immunotherapy  . NECK SURGERY    . REPLACEMENT TOTAL KNEE      FAMILY HISTORY Family History  Problem Relation Age of Onset  . Heart disease Mother   . Heart attack Mother   . Colon polyps Mother   . Heart attack Maternal Grandmother   . Heart attack Maternal Grandfather   . Colon polyps Brother   . Colon cancer Neg Hx     SOCIAL HISTORY Social History   Tobacco Use  . Smoking status: Former Smoker    Packs/day: 1.50    Years: 40.00    Pack years: 60.00    Types: Cigarettes    Start date: 11/04/1963    Quit date: 10/29/2003    Years since quitting: 16.2  . Smokeless tobacco: Never Used  Substance Use Topics  . Alcohol use:  Yes    Alcohol/week: 0.0 standard drinks    Comment: one miller lite daily   . Drug use: No         OPHTHALMIC EXAM:  Base Eye Exam    Visual Acuity (Snellen - Linear)      Right Left   Dist cc 20/20 +2 20/25   Dist ph cc  NI   Correction: Glasses       Tonometry (Tonopen, 9:20  AM)      Right Left   Pressure 20 22       Gonioscopy      Right Left   Temporal  Grade 4   Nasal  Grade 4   Superior  Grade 4   Inferior  Grade 4  OS: 3-4+pigment and focal PAS at 0600       Pupils      Dark Light Shape React APD   Right 3 2 Round Brisk None   Left 3 2 Round Brisk None       Visual Fields (Counting fingers)      Left Right    Full Full       Extraocular Movement      Right Left    Full, Ortho Full, Ortho       Neuro/Psych    Oriented x3: Yes   Mood/Affect: Normal       Dilation    Both eyes: 1.0% Mydriacyl, 2.5% Phenylephrine @ 9:20 AM        Slit Lamp and Fundus Exam    External Exam      Right Left   External brow ptosis brow ptosis; mild periorbital edema and erythema       Slit Lamp Exam      Right Left   Lids/Lashes Dermatochalasis - upper lid, Telangiectasia, mild Meibomian gland dysfunction Dermatochalasis - upper lid, Meibomian gland dysfunction, Telangiectasia; mild erythema temporal upper lid   Conjunctiva/Sclera superior and temporal Pinguecula nasal and temporal Pinguecula, Conjunctivochalasis inferiorly, trace Injection   Cornea mild arcus, 1+ Punctate epithelial erosions mild arcus, well healed cataract wounds, trace Punctate epithelial erosions and haze, high tear lake   Anterior Chamber Deep and quiet, no cell/flare Deep, 0.5+pigment   Iris No NVI, Round and dilated No NVI, moderately dilated to 4, Transillumination defects from 0100-0200 with IOL haptic visible within - stable   Lens Posterior chamber intraocular lens in good position Posterior chamber intraocular lens in good position   Vitreous vitreous syneresis Vitreous syneresis;  no heme       Fundus Exam      Right Left   Disc Pink and Sharp Pink and Sharp   C/D Ratio 0.2 0.3   Macula Flat, mild RPE mottling, No heme or edema Flat, blunted foveal reflex, mild RPE mottling, No heme or edema   Vessels normal, mildly Tortuous Vascular attenuation, mild Tortuousity   Periphery Attached, round focal area of CR atrophy at 0630 mid zone Attached          IMAGING AND PROCEDURES  Imaging and Procedures for @TODAY @  OCT, Retina - OU - Both Eyes       Right Eye Quality was good. Central Foveal Thickness: 269. Progression has been stable. Findings include normal foveal contour, no SRF, no IRF (partial PVD - stable).   Left Eye Quality was good. Central Foveal Thickness: 263. Progression has been stable. Findings include normal foveal contour, no IRF, no SRF (Partial PVD  - stable).   Notes *Images captured and stored on drive  Diagnosis / Impression:  NFP, no IRF, no SRF OU Partial PVD OU stable  Clinical management:  See below  Abbreviations: NFP - Normal foveal profile. CME - cystoid macular edema. PED - pigment epithelial detachment. IRF - intraretinal fluid. SRF - subretinal fluid. EZ - ellipsoid zone. ERM - epiretinal membrane. ORA - outer retinal atrophy. ORT - outer retinal tubulation. 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 Uveitis-hyphema-glaucoma syndrome of left eye  T85.398A    H20.9    H40.42X0   3. Pigmentary glaucoma of left eye, mild stage  H40.1321   4. Retinal edema  H35.81 OCT, Retina - OU - Both Eyes  5. Pseudophakia of both eyes  Z96.1   6. Lyme disease  A69.20   7. Brow ptosis  H57.819   8. Dermatochalasis of both upper eyelids  H02.831    H02.834    1-3. UGH Syndrome OS  - 3 piece PCIOL OS centered, but iris has large TID in sup temp quadrant with IOL haptic within area  - d/c'd Inveltys on 10/10/18 but had flare up on 11/29/18 and  was restarted  - currently on Inveltys BID, Cosopt BID, and brimonidine TID OS  - AC with 0.5+pigment today -- stable -- likely new baseline   - IOP 22 today on cosopt BID and brimonidine TID OS  - repeat gonio today shows open angles OS, mild focal PAS  - cont inveltys to bid OS   - cont cosopt bid OS   - cont brimonidine tid OS   - f/u 2-3 months for recheck of IOP and DFE  - will refer to Dr. Midge Aver for ocular hypertension/glaucoma eval/management  4. No retinal edema on exam or OCT  5. Pseudophakia OU  - s/p CE/IOL OU -- pt can't remember dates, one eye by [facility], other eye by [facility]  - likely UGH OS as above  - PCIOL OD appears to be in good position  - monitor  6. Lyme disease  - history of diagnosis from target lesions on lower extremities  - no blood test confirmation performed  - completed doxycycline per PCP office  - no manifestation in the eyes, but will continue to monitor  7,8. Brow ptosis, dermatochalasis OU  - referred to Dr. Vickki Muff (now at [facility]) for eyelid eval     Ophthalmic Meds Ordered this visit:  No orders of the defined types were placed in this encounter.      Return for 2-3 mos - f/u UGH syndrome--  Dilated Exam, OCT.  There are no Patient Instructions on file for this visit.   Explained the diagnoses, plan, and follow up with the patient and they expressed understanding.  Patient expressed understanding of the importance of proper follow up care.   This document serves as a record of services personally performed by Gardiner Sleeper, MD, PhD. It was created on their behalf by Leeann Must, [place], a certified ophthalmic assistant. The creation of this record is the provider's dictation and/or activities during the visit.    Electronically signed by: Leeann Must, COA @TODAY @ 11:52 AM   This document serves as a record of services personally performed by Gardiner Sleeper, MD, PhD. It was created on their behalf by Ernest Mallick,  OA, an ophthalmic assistant. The creation of this record is the provider's dictation and/or activities during the visit.    Electronically signed by: Ernest Mallick, OA 02.01.2021 11:52 AM   Gardiner Sleeper, M.D., Ph.D. Diseases & Surgery of the Retina and Vitreous [facility]  I have reviewed the above documentation for accuracy and completeness, and I agree with the above. Gardiner Sleeper, M.D., Ph.D. 01/29/20 11:52 AM   Abbreviations: M myopia (nearsighted); A astigmatism; H hyperopia (farsighted); P presbyopia; Mrx spectacle prescription;  CTL contact lenses;  OD right eye; OS left eye; OU both eyes  XT exotropia; ET esotropia; PEK punctate epithelial keratitis; PEE punctate epithelial erosions; DES dry eye syndrome; MGD meibomian gland dysfunction; ATs artificial tears; PFAT's preservative free artificial tears; [place] nuclear sclerotic cataract; PSC posterior subcapsular cataract; ERM epi-retinal membrane; PVD posterior vitreous detachment; RD retinal detachment; DM diabetes mellitus; DR diabetic retinopathy; NPDR non-proliferative diabetic retinopathy; PDR proliferative diabetic retinopathy; CSME clinically significant macular edema; DME diabetic macular edema; dbh dot blot hemorrhages; CWS cotton wool spot; POAG primary open angle glaucoma; C/D cup-to-disc ratio; HVF humphrey visual field; GVF goldmann visual field; OCT optical coherence tomography; IOP intraocular pressure; BRVO Branch retinal vein occlusion; CRVO central retinal vein occlusion; CRAO central retinal artery occlusion; BRAO branch retinal artery occlusion; RT retinal tear; SB scleral buckle; PPV pars plana vitrectomy; VH Vitreous hemorrhage; PRP panretinal laser photocoagulation; IVK intravitreal kenalog; VMT vitreomacular traction; MH Macular hole;  NVD neovascularization of the disc; NVE neovascularization elsewhere; AREDS age related eye disease study; ARMD age related macular degeneration; POAG primary open  angle glaucoma; EBMD epithelial/anterior basement membrane dystrophy; ACIOL anterior chamber intraocular lens; IOL intraocular lens; PCIOL posterior chamber intraocular lens; Phaco/IOL phacoemulsification with intraocular lens placement; [place] photorefractive keratectomy; LASIK laser assisted in situ keratomileusis; HTN hypertension; DM diabetes mellitus; COPD chronic obstructive pulmonary disease

## 2020-01-29 ENCOUNTER — Other Ambulatory Visit: Payer: Self-pay

## 2020-01-29 ENCOUNTER — Ambulatory Visit (INDEPENDENT_AMBULATORY_CARE_PROVIDER_SITE_OTHER): Payer: Medicare Other | Admitting: Ophthalmology

## 2020-01-29 ENCOUNTER — Encounter (INDEPENDENT_AMBULATORY_CARE_PROVIDER_SITE_OTHER): Payer: Self-pay | Admitting: Ophthalmology

## 2020-01-29 DIAGNOSIS — H57819 Brow ptosis, unspecified: Secondary | ICD-10-CM | POA: Diagnosis not present

## 2020-01-29 DIAGNOSIS — A6920 Lyme disease, unspecified: Secondary | ICD-10-CM

## 2020-01-29 DIAGNOSIS — H02831 Dermatochalasis of right upper eyelid: Secondary | ICD-10-CM | POA: Diagnosis not present

## 2020-01-29 DIAGNOSIS — Z961 Presence of intraocular lens: Secondary | ICD-10-CM | POA: Diagnosis not present

## 2020-01-29 DIAGNOSIS — H4042X0 Glaucoma secondary to eye inflammation, left eye, stage unspecified: Secondary | ICD-10-CM | POA: Diagnosis not present

## 2020-01-29 DIAGNOSIS — H3581 Retinal edema: Secondary | ICD-10-CM | POA: Diagnosis not present

## 2020-01-29 DIAGNOSIS — H02834 Dermatochalasis of left upper eyelid: Secondary | ICD-10-CM

## 2020-01-29 DIAGNOSIS — H209 Unspecified iridocyclitis: Secondary | ICD-10-CM

## 2020-01-29 DIAGNOSIS — H401321 Pigmentary glaucoma, left eye, mild stage: Secondary | ICD-10-CM

## 2020-01-29 DIAGNOSIS — T85398A Other mechanical complication of other ocular prosthetic devices, implants and grafts, initial encounter: Secondary | ICD-10-CM

## 2020-01-30 ENCOUNTER — Ambulatory Visit (INDEPENDENT_AMBULATORY_CARE_PROVIDER_SITE_OTHER): Payer: Medicare Other | Admitting: Cardiology

## 2020-01-30 ENCOUNTER — Encounter (INDEPENDENT_AMBULATORY_CARE_PROVIDER_SITE_OTHER): Payer: Medicare Other | Admitting: Ophthalmology

## 2020-01-30 ENCOUNTER — Encounter: Payer: Self-pay | Admitting: Cardiology

## 2020-01-30 VITALS — BP 150/58 | HR 66 | Ht 68.0 in | Wt 188.0 lb

## 2020-01-30 DIAGNOSIS — I10 Essential (primary) hypertension: Secondary | ICD-10-CM | POA: Diagnosis not present

## 2020-01-30 DIAGNOSIS — I25119 Atherosclerotic heart disease of native coronary artery with unspecified angina pectoris: Secondary | ICD-10-CM

## 2020-01-30 DIAGNOSIS — I6523 Occlusion and stenosis of bilateral carotid arteries: Secondary | ICD-10-CM | POA: Diagnosis not present

## 2020-01-30 DIAGNOSIS — E782 Mixed hyperlipidemia: Secondary | ICD-10-CM

## 2020-01-30 NOTE — Patient Instructions (Addendum)

## 2020-01-30 NOTE — Progress Notes (Signed)
Cardiology Office Note  Date: 01/30/2020   ID: Christopher Randolph, DOB [birth year], MRN [id number]  PCP:  Curlene Labrum, MD  Cardiologist:  Christopher Lesches, MD Electrophysiologist:  None   Chief Complaint  Patient presents with  . Cardiac follow-up    History of Present Illness: Christopher Randolph is a 74 y.o. male last seen in September 2020.  He presents for a routine visit.  From a cardiac perspective he does not report any significant angina or progressive dyspnea on exertion with typical activities.  He does tell me that he was diagnosed with COVID-19 a little over a month ago and is nearly back to baseline.  Both he and his wife had reportedly overall mild symptoms and stated home.  He is due for follow-up carotid Dopplers later this year.  He remains asymptomatic and continues on aspirin as well as Lipitor with bilateral carotid artery disease.  We discontinued HCTZ from his regimen at last visit due to orthostasis and relatively low blood pressures at times.  He states that this went well but within the last few weeks his blood pressure has been trending up.  He continues to track this at home and will plan to follow-up with Dr. Pleas Koch in case further medication adjustments are necessary.  Past Medical History:  Diagnosis Date  . Arthritis   . CAD (coronary artery disease)    Branch vessel and moderate RCA disease 2006  . Cancer ([place]) 1990   Melanoma Lower right Leg  . Carotid artery disease ([place])   . Diabetes ([place])   . Essential hypertension   . Hyperlipidemia   . Hypothyroidism   . Lyme disease   . PAD (peripheral artery disease) ([place])    Left common iliac stent 2004  . PONV (postoperative nausea and vomiting)     Past Surgical History:  Procedure Laterality Date  . CATARACT EXTRACTION Bilateral   . CATARACT EXTRACTION, BILATERAL    . CERVICAL DISC SURGERY    . COLONOSCOPY N/A 11/06/2016   Procedure: COLONOSCOPY;  Surgeon: Christopher Dolin, MD;  [facility];  Service: Endoscopy;  Laterality: N/A;  7:30 AM  . COLONOSCOPY  2012   Dr. Anthony Randolph: normal. reviewed reports, which states he has a history of polyps in remote past.   . EYE SURGERY    . JOINT REPLACEMENT    . LUMBAR WOUND DEBRIDEMENT N/A 01/24/2019   Procedure: LUMBAR WOUND Exploration for Evacation of Seroma vs. Hematoma;  Surgeon: Christopher Gamma, MD;  Location: [place];  Service: Neurosurgery;  Laterality: N/A;  . MELANOMA EXCISION     right leg, [facility] at [facility] and underwent immunotherapy  . NECK SURGERY    . REPLACEMENT TOTAL KNEE      Current Outpatient Medications  Medication Sig Dispense Refill  . aspirin EC 81 MG tablet Take 81 mg by mouth daily.      Marland Kitchen atorvastatin (LIPITOR) 20 MG tablet TAKE 1 TABLET BY MOUTH DAILY 90 tablet 3  . brimonidine (ALPHAGAN) 0.2 % ophthalmic solution Place 1 drop into the left eye 3 (three) times daily. 10 mL 10  . fluticasone (FLONASE ALLERGY RELIEF) 50 MCG/ACT nasal spray Place 2 sprays into both nostrils daily.     Marland Kitchen levothyroxine (SYNTHROID, LEVOTHROID) 150 MCG tablet Take 150 mcg by mouth every other day.   1  . levothyroxine (SYNTHROID, LEVOTHROID) 175 MCG tablet Take 175 mcg by mouth every other day.     . Loteprednol Etabonate (INVELTYS)  1 % SUSP Place 1 drop into the left eye 2 (two) times daily. (Patient taking differently: Place 1 drop into the left eye 2 (two) times a day. ) 2.8 mL 2  . Loteprednol Etabonate (INVELTYS) 1 % SUSP Apply 1 drop to eye 2 (two) times a day. 1 drop 2 times daily left eye (Patient taking differently: Apply 1 drop to eye 3 (three) times daily. 1 drop 2 times daily left eye) 2.8 mL 10  . metFORMIN (GLUCOPHAGE) 500 MG tablet Take 500 mg by mouth daily.     . metoprolol tartrate (LOPRESSOR) 25 MG tablet Take 25 mg by mouth daily.     . mometasone (ELOCON) 0.1 % ointment Apply 1 application topically 2 (two) times daily as needed (for psorasis).    . naproxen sodium (ANAPROX) 220 MG tablet Take 440 mg by mouth  daily as needed (for pain.).    Marland Kitchen nitroGLYCERIN (NITROSTAT) 0.4 MG SL tablet Place 1 tablet (0.4 mg total) under the tongue every 5 (five) minutes x 3 doses as needed for chest pain (if no relief after 3rd dose, proceed to the ED for an evaluation or call 911). 25 tablet 3  . tamsulosin (FLOMAX) 0.4 MG CAPS capsule Take 0.4 mg by mouth 2 (two) times daily.    Marland Kitchen telmisartan (MICARDIS) 80 MG tablet Take 1 tablet (80 mg total) by mouth daily. 90 tablet 3   No current facility-administered medications for this visit.   Allergies:  Azithromycin and Shrimp [shellfish allergy]   Social History: The patient  reports that he quit smoking about 16 years ago. His smoking use included cigarettes. He started smoking about 56 years ago. He has a 60.00 pack-year smoking history. He has never used smokeless tobacco. He reports current alcohol use. He reports that he does not use drugs.   ROS:  Please see the history of present illness. Otherwise, complete review of systems is positive for none.  All other systems are reviewed and negative.   Physical Exam: VS:  BP (!) 150/58   Pulse 66   Ht 5\' 8"  (1.727 m)   Wt 188 lb (85.3 kg)   SpO2 96%   BMI 28.59 kg/m , BMI Body mass index is 28.59 kg/m.  Wt Readings from Last 3 Encounters:  01/30/20 188 lb (85.3 kg)  09/14/19 186 lb (84.4 kg)  03/10/19 183 lb 6.4 oz (83.2 kg)    General: Patient appears comfortable at rest. HEENT: Conjunctiva and lids normal, wearing a mask. Neck: Supple, no elevated JVP or carotid bruits, no thyromegaly. Lungs: Clear to auscultation, nonlabored breathing at rest. Cardiac: Regular rate and rhythm, no S3 or significant systolic murmur. Abdomen: Soft, nontender, bowel sounds present. Extremities: No pitting edema, distal pulses 2+. Skin: Warm and dry. Musculoskeletal: No kyphosis. Neuropsychiatric: Alert and oriented x3, affect grossly appropriate.  ECG:  An ECG dated 01/11/2019 was personally reviewed today and  demonstrated:  Sinus bradycardia with lead motion artifact and nonspecific T wave changes.  Recent Labwork:  01/24/2019: BUN 22; Creatinine, Ser 0.94; Hemoglobin 9.8; Platelets 151; Potassium 4.0; Sodium 136   Other Studies Reviewed Today:  Echocardiogram 12/06/2017 [facility]): Mild LVH with LVEF 55 to [id number], normal diastolic function, mild left atrial enlargement, normal right ventricular contraction, no major valvular abnormalities.  Carotid Dopplers 09/06/2019: Summary: Right Carotid: Velocities in the right ICA are consistent with a 60-79% stenosis.  Left Carotid: Velocities in the left ICA are consistent with a 40-59% stenosis. High end of range, likely  stable.  Vertebrals: Bilateral vertebral arteries demonstrate antegrade flow. Subclavians: Normal flow hemodynamics were seen in bilateral subclavian arteries.  Assessment and Plan:  1.  Branch vessel CAD with moderate RCA disease, being managed medically without progressive angina symptoms.  Continue aspirin, Lipitor, Lopressor, and micardis.  ECG today shows sinus rhythm with low voltage and nonspecific ST-T changes.  2.  Essential hypertension, blood pressure elevated today.  He is currently on Lopressor and micardis.  He will continue to track blood pressure trend at home, may need further adjustments going forward.  3.  Moderate carotid artery disease, he is due for follow-up Dopplers later in the year.  Remains asymptomatic on aspirin and statin.  4.  Mixed hyperlipidemia, he continues on Lipitor.  Medication Adjustments/Labs and Tests Ordered: Current medicines are reviewed at length with the patient today.  Concerns regarding medicines are outlined above.   Tests Ordered: Orders Placed This Encounter  Procedures  . EKG 12-Lead    Medication Changes: No orders of the defined types were placed in this encounter.   Disposition:  Follow up 6 months in the [place]  office.  Signed, Satira Sark, MD, [facility] 01/30/2020 2:48 PM    [facility] [place], [place], [place] [address] Phone: [phone]; Fax: [phone]

## 2020-02-22 DIAGNOSIS — Z23 Encounter for immunization: Secondary | ICD-10-CM | POA: Diagnosis not present

## 2020-03-04 DIAGNOSIS — H57813 Brow ptosis, bilateral: Secondary | ICD-10-CM | POA: Diagnosis not present

## 2020-03-04 DIAGNOSIS — H35352 Cystoid macular degeneration, left eye: Secondary | ICD-10-CM | POA: Diagnosis not present

## 2020-03-04 DIAGNOSIS — T8579XA Infection and inflammatory reaction due to other internal prosthetic devices, implants and grafts, initial encounter: Secondary | ICD-10-CM | POA: Diagnosis not present

## 2020-03-04 DIAGNOSIS — Z961 Presence of intraocular lens: Secondary | ICD-10-CM | POA: Diagnosis not present

## 2020-03-04 DIAGNOSIS — H02831 Dermatochalasis of right upper eyelid: Secondary | ICD-10-CM | POA: Diagnosis not present

## 2020-03-04 DIAGNOSIS — H02834 Dermatochalasis of left upper eyelid: Secondary | ICD-10-CM | POA: Diagnosis not present

## 2020-03-13 DIAGNOSIS — H02834 Dermatochalasis of left upper eyelid: Secondary | ICD-10-CM | POA: Diagnosis not present

## 2020-03-13 DIAGNOSIS — Z961 Presence of intraocular lens: Secondary | ICD-10-CM | POA: Diagnosis not present

## 2020-03-13 DIAGNOSIS — H02831 Dermatochalasis of right upper eyelid: Secondary | ICD-10-CM | POA: Diagnosis not present

## 2020-03-13 DIAGNOSIS — H35352 Cystoid macular degeneration, left eye: Secondary | ICD-10-CM | POA: Diagnosis not present

## 2020-03-13 DIAGNOSIS — H57813 Brow ptosis, bilateral: Secondary | ICD-10-CM | POA: Diagnosis not present

## 2020-03-13 DIAGNOSIS — T8579XA Infection and inflammatory reaction due to other internal prosthetic devices, implants and grafts, initial encounter: Secondary | ICD-10-CM | POA: Diagnosis not present

## 2020-03-15 ENCOUNTER — Telehealth: Payer: Self-pay | Admitting: *Deleted

## 2020-03-15 DIAGNOSIS — I779 Disorder of arteries and arterioles, unspecified: Secondary | ICD-10-CM

## 2020-03-15 NOTE — Progress Notes (Addendum)
[facility] Note  03/21/2020     CHIEF COMPLAINT Patient presents for Retina Follow Up   HISTORY OF PRESENT ILLNESS: Christopher Randolph is a 74 y.o. male who presents to the clinic today for:   HPI    Retina Follow Up    Patient presents with  Other.  In both eyes.  This started 6.5 weeks ago.  Severity is moderate.  I, the attending physician,  performed the HPI with the patient and updated documentation appropriately.          Comments    Patient here for 6.5 weeks retina follow up for UGH syndrome. Patient states vision doing ok. Has a little bit of eye pain OS- comes and goes. Takes a pressure pill that Dr [name] prescribed.       Last edited by [name], MD on 03/21/2020  8:36 AM. (History)    Patient states he saw Dr. Midge Aver who told him that his lens needs to be replaced because it was put in the wrong place and it is what is causing the pressure in his eye to be so high, he states Dr. [name] put him on another pressure drop at bed time only as well as a pill that he is supposed to take 3 times a day, he states he saw Dr. [name] yesterday and he told him he could skip a dose if needs to because of the side effects, he states his pressure in [facility] office yesterday was 15   Referring physician: Curlene Labrum, MD [place],  [place] [id number]  HISTORICAL INFORMATION:   Selected notes from the MEDICAL RECORD NUMBER Referred by Dr. [name] for concern of vitreous hemorrhage LEE: 05.21.19 [name]) [BCVA: OD: OS: ] Ocular Hx-Psedophakia OU,  PMH-DM (on Metformin), Arthritis     CURRENT MEDICATIONS: Current Outpatient Medications (Ophthalmic Drugs)  Medication Sig  . brimonidine (ALPHAGAN) 0.2 % ophthalmic solution Place 1 drop into the left eye 3 (three) times daily.  . Loteprednol Etabonate (INVELTYS) 1 % SUSP Place 1 drop into the left eye 2 (two) times daily. (Patient taking differently: Place 1 drop into the left eye 2  (two) times a day. )  . Loteprednol Etabonate (INVELTYS) 1 % SUSP Apply 1 drop to eye 2 (two) times a day. 1 drop 2 times daily left eye (Patient taking differently: Apply 1 drop to eye 3 (three) times daily. 1 drop 2 times daily left eye)   No current facility-administered medications for this visit. (Ophthalmic Drugs)   Current Outpatient Medications (Other)  Medication Sig  . aspirin EC 81 MG tablet Take 81 mg by mouth daily.    Marland Kitchen atorvastatin (LIPITOR) 20 MG tablet TAKE 1 TABLET BY MOUTH DAILY  . fluticasone (FLONASE ALLERGY RELIEF) 50 MCG/ACT nasal spray Place 2 sprays into both nostrils daily.   Marland Kitchen levothyroxine (SYNTHROID, LEVOTHROID) 150 MCG tablet Take 150 mcg by mouth every other day.   . levothyroxine (SYNTHROID, LEVOTHROID) 175 MCG tablet Take 175 mcg by mouth every other day.   . metFORMIN (GLUCOPHAGE) 500 MG tablet Take 500 mg by mouth daily.   . metoprolol tartrate (LOPRESSOR) 25 MG tablet Take 25 mg by mouth daily.   . mometasone (ELOCON) 0.1 % ointment Apply 1 application topically 2 (two) times daily as needed (for psorasis).  . naproxen sodium (ANAPROX) 220 MG tablet Take 440 mg by mouth daily as needed (for pain.).  Marland Kitchen nitroGLYCERIN (NITROSTAT) 0.4  MG SL tablet Place 1 tablet (0.4 mg total) under the tongue every 5 (five) minutes x 3 doses as needed for chest pain (if no relief after 3rd dose, proceed to the ED for an evaluation or call 911).  . tamsulosin (FLOMAX) 0.4 MG CAPS capsule Take 0.4 mg by mouth 2 (two) times daily.  Marland Kitchen telmisartan (MICARDIS) 80 MG tablet Take 1 tablet (80 mg total) by mouth daily.   No current facility-administered medications for this visit. (Other)      REVIEW OF SYSTEMS: ROS    Positive for: Musculoskeletal, Cardiovascular, Eyes   Negative for: Constitutional, Gastrointestinal, Neurological, Skin, Genitourinary, HENT, Endocrine, Respiratory, Psychiatric, Allergic/Imm, Heme/Lymph   Last edited by [name], COA on 03/21/2020  8:08 AM.  (History)       ALLERGIES Allergies  Allergen Reactions  . Azithromycin Swelling  . Shrimp [Shellfish Allergy] Other (See Comments)    Gout flares    PAST MEDICAL HISTORY Past Medical History:  Diagnosis Date  . Arthritis   . CAD (coronary artery disease)    Branch vessel and moderate RCA disease 2006  . Cancer ([place]) 1990   Melanoma Lower right Leg  . Carotid artery disease ([place])   . Diabetes ([place])   . Essential hypertension   . Hyperlipidemia   . Hypothyroidism   . Lyme disease   . PAD (peripheral artery disease) ([place])    Left common iliac stent 2004  . PONV (postoperative nausea and vomiting)    Past Surgical History:  Procedure Laterality Date  . CATARACT EXTRACTION Bilateral   . CATARACT EXTRACTION, BILATERAL    . CERVICAL DISC SURGERY    . COLONOSCOPY N/A 11/06/2016   Procedure: COLONOSCOPY;  Surgeon: Daneil Dolin, MD;  [facility];  Service: Endoscopy;  Laterality: N/A;  7:30 AM  . COLONOSCOPY  2012   Dr. Anthony Sar: normal. reviewed reports, which states he has a history of polyps in remote past.   . EYE SURGERY    . JOINT REPLACEMENT    . LUMBAR WOUND DEBRIDEMENT N/A 01/24/2019   Procedure: LUMBAR WOUND Exploration for Evacation of Seroma vs. Hematoma;  Surgeon: Jovita Gamma, MD;  Location: [facility];  Service: Neurosurgery;  Laterality: N/A;  . MELANOMA EXCISION     right leg, [facility] and underwent immunotherapy  . NECK SURGERY    . REPLACEMENT TOTAL KNEE      FAMILY HISTORY Family History  Problem Relation Age of Onset  . Heart disease Mother   . Heart attack Mother   . Colon polyps Mother   . Heart attack Maternal Grandmother   . Heart attack Maternal Grandfather   . Colon polyps Brother   . Colon cancer Neg Hx     SOCIAL HISTORY Social History   Tobacco Use  . Smoking status: Former Smoker    Packs/day: 1.50    Years: 40.00    Pack years: 60.00    Types: Cigarettes    Start date: 11/04/1963    Quit date: 10/29/2003     Years since quitting: 16.4  . Smokeless tobacco: Never Used  Substance Use Topics  . Alcohol use: Yes    Alcohol/week: 0.0 standard drinks    Comment: one miller lite daily   . Drug use: No         OPHTHALMIC EXAM:  Base Eye Exam    Visual Acuity (Snellen - Linear)      Right Left   Dist cc 20/20 20/25  Dist ph cc  20/20 -1   Correction: Glasses       Tonometry (Tonopen, 8:04 AM)      Right Left   Pressure 14 10       Pupils      Dark Light Shape React APD   Right 3 2 Round Brisk None   Left 3 2 Round Brisk None       Visual Fields (Counting fingers)      Left Right    Full Full       Extraocular Movement      Right Left    Full, Ortho Full, Ortho       Neuro/Psych    Oriented x3: Yes   Mood/Affect: Normal       Dilation    Both eyes: 1.0% Mydriacyl, 2.5% Phenylephrine @ 8:03 AM        Slit Lamp and Fundus Exam    External Exam      Right Left   External brow ptosis brow ptosis; mild periorbital edema and erythema       Slit Lamp Exam      Right Left   Lids/Lashes Dermatochalasis - upper lid, Telangiectasia, mild Meibomian gland dysfunction Dermatochalasis - upper lid, Meibomian gland dysfunction, Telangiectasia; mild erythema temporal upper lid, mild Ptosis   Conjunctiva/Sclera superior and temporal Pinguecula nasal and temporal Pinguecula, Conjunctivochalasis inferiorly, trace Injection   Cornea mild arcus, 1+ Punctate epithelial erosions mild arcus, well healed cataract wounds, trace Punctate epithelial erosions and haze, high tear lake   Anterior Chamber Deep and quiet, no cell/flare Deep, 0.5+pigment   Iris No NVI, Round and dilated No NVI, moderately dilated to 5.5, Transillumination defects from 0100-0200 with IOL haptic visible within - stable   Lens Posterior chamber intraocular lens in good position 3 piece Posterior chamber intraocular lens    Vitreous vitreous syneresis Vitreous syneresis, vitreous condensations       Fundus Exam       Right Left   Disc Pink and Sharp Pink and Sharp   C/D Ratio 0.2 0.3   Macula Flat, mild RPE mottling, No heme or edema Flat, blunted foveal reflex, mild RPE mottling, No heme or edema   Vessels normal, mildly Tortuous Vascular attenuation, mild Tortuousity   Periphery Attached, round focal area of CR atrophy at 0630 mid zone Attached        Refraction    Wearing Rx      Sphere Cylinder Axis   Right +0.50 Sphere    Left -0.25 +0.25 037   Type: Trifocal          IMAGING AND PROCEDURES  Imaging and Procedures for @TODAY @  OCT, Retina - OU - Both Eyes       Right Eye Quality was good. Central Foveal Thickness: 265. Progression has been stable. Findings include normal foveal contour, no SRF, no IRF (partial PVD - stable).   Left Eye Quality was good. Central Foveal Thickness: 261. Progression has been stable. Findings include normal foveal contour, no IRF, no SRF (Partial PVD  - stable).   Notes *Images captured and stored on drive  Diagnosis / Impression:  NFP, no IRF, no SRF OU Partial PVD OU stable  Clinical management:  See below  Abbreviations: NFP - Normal foveal profile. CME - cystoid macular edema. PED - pigment epithelial detachment. IRF - intraretinal fluid. SRF - subretinal fluid. EZ - ellipsoid zone. ERM - epiretinal membrane. ORA - outer retinal atrophy. ORT - outer retinal tubulation.  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 Uveitis-hyphema-glaucoma syndrome of left eye  T85.398A    H20.9    H40.42X0   3. Pigmentary glaucoma of left eye, mild stage  H40.1321   4. Lyme disease  A69.20   5. Dermatochalasis of both upper eyelids  H02.831    H02.834   6. Pseudophakia of both eyes  Z96.1   7. Retinal edema  H35.81 OCT, Retina - OU - Both Eyes  8. Brow ptosis  H57.819    1-3. UGH Syndrome OS  - 3 piece PCIOL OS centered, but iris has large TID in sup temp  quadrant with IOL haptic within area  - d/c'd Inveltys on 10/10/18 but had flare up on 11/29/18 and was restarted  - currently on Inveltys BID, Cosopt BID, and brimonidine TID OS  - AC with 0.5+pigment today -- stable -- likely new baseline   - IOP 10 today on cosopt BID and brimonidine TID OS -- added latanoprost QHS and diamox per Dr. Shirleen Schirmer  - tmax at [facility]  - repeat gonio today shows open angles OS, mild focal PAS  - cont inveltys bid OS   - cont IOP drops OS per Dr. [name]  - Dr. [name] has recommended IOL exchange OS and pt wishes to proceed with surgery  - RBA of procedure discussed, questions answered  - informed consent obtained and signed  - will schedule 25g PPV w/ IOL exchange, sutured [place] IOL for 04/04/20, [facility] OR 08  - pt will need medical clearance from PCP and pre-op COVID testing  - f/u tomorrow for IOL calcs, then POV1 on 4.9.21  4. No retinal edema on exam or OCT  5. Pseudophakia OU  - s/p CE/IOL OU -- pt can't remember dates, one eye by [facility], other eye by [facility]  - likely UGH OS as above  - PCIOL OD appears to be in good position  - monitor  6. Lyme disease  - history of diagnosis from target lesions on lower extremities  - no blood test confirmation performed  - completed doxycycline per PCP office  - no manifestation in the eyes, but will continue to monitor  7,8. Brow ptosis, dermatochalasis OU  - referred to Dr. Vickki Muff (now at [facility]) for eyelid eval  - holding on repair for now     Ophthalmic Meds Ordered this visit:  No orders of the defined types were placed in this encounter.      Return in about 1 day (around 03/22/2020) for IOL calcs only.  There are no Patient Instructions on file for this visit.   Explained the diagnoses, plan, and follow up with the patient and they expressed understanding.  Patient expressed understanding of the importance of proper follow up care.   This document serves as a record of services  personally performed by Gardiner Sleeper, MD, PhD. It was created on their behalf by Leeann Must, [place], a certified ophthalmic assistant. The creation of this record is the provider's dictation and/or activities during the visit.    Electronically signed by: Leeann Must, COA @TODAY @ 12:09 PM   This document serves as a record of services personally performed by Gardiner Sleeper, MD, PhD. It was created on their behalf by Ernest Mallick, OA, an ophthalmic assistant. The creation of  this record is the provider's dictation and/or activities during the visit.    Electronically signed by: Ernest Mallick, OA 03.25.2021 12:09 PM   Gardiner Sleeper, M.D., Ph.D. Diseases & Surgery of the Retina and Vitreous [facility]  I have reviewed the above documentation for accuracy and completeness, and I agree with the above. Gardiner Sleeper, M.D., Ph.D. 03/21/20 12:09 PM   Abbreviations: M myopia (nearsighted); A astigmatism; H hyperopia (farsighted); P presbyopia; Mrx spectacle prescription;  CTL contact lenses; OD right eye; OS left eye; OU both eyes  XT exotropia; ET esotropia; PEK punctate epithelial keratitis; PEE punctate epithelial erosions; DES dry eye syndrome; MGD meibomian gland dysfunction; ATs artificial tears; PFAT's preservative free artificial tears; [place] nuclear sclerotic cataract; PSC posterior subcapsular cataract; ERM epi-retinal membrane; PVD posterior vitreous detachment; RD retinal detachment; DM diabetes mellitus; DR diabetic retinopathy; NPDR non-proliferative diabetic retinopathy; PDR proliferative diabetic retinopathy; CSME clinically significant macular edema; DME diabetic macular edema; dbh dot blot hemorrhages; CWS cotton wool spot; POAG primary open angle glaucoma; C/D cup-to-disc ratio; HVF humphrey visual field; GVF goldmann visual field; OCT optical coherence tomography; IOP intraocular pressure; BRVO Branch retinal vein occlusion; CRVO central retinal vein  occlusion; CRAO central retinal artery occlusion; BRAO branch retinal artery occlusion; RT retinal tear; SB scleral buckle; PPV pars plana vitrectomy; VH Vitreous hemorrhage; PRP panretinal laser photocoagulation; IVK intravitreal kenalog; VMT vitreomacular traction; MH Macular hole;  NVD neovascularization of the disc; NVE neovascularization elsewhere; AREDS age related eye disease study; ARMD age related macular degeneration; POAG primary open angle glaucoma; EBMD epithelial/anterior basement membrane dystrophy; ACIOL anterior chamber intraocular lens; IOL intraocular lens; PCIOL posterior chamber intraocular lens; Phaco/IOL phacoemulsification with intraocular lens placement; [place] photorefractive keratectomy; LASIK laser assisted in situ keratomileusis; HTN hypertension; DM diabetes mellitus; COPD chronic obstructive pulmonary disease

## 2020-03-15 NOTE — Telephone Encounter (Signed)
-----   Message from Chanda Busing sent at 03/15/2020  8:40 AM EDT ----- Regarding: order Patient is due for Cartoid study 9--10-21 (1 yr)  need an order please

## 2020-03-20 DIAGNOSIS — T8579XS Infection and inflammatory reaction due to other internal prosthetic devices, implants and grafts, sequela: Secondary | ICD-10-CM | POA: Diagnosis not present

## 2020-03-20 DIAGNOSIS — Z961 Presence of intraocular lens: Secondary | ICD-10-CM | POA: Diagnosis not present

## 2020-03-20 DIAGNOSIS — H02834 Dermatochalasis of left upper eyelid: Secondary | ICD-10-CM | POA: Diagnosis not present

## 2020-03-20 DIAGNOSIS — H35352 Cystoid macular degeneration, left eye: Secondary | ICD-10-CM | POA: Diagnosis not present

## 2020-03-20 DIAGNOSIS — H02831 Dermatochalasis of right upper eyelid: Secondary | ICD-10-CM | POA: Diagnosis not present

## 2020-03-20 DIAGNOSIS — H57813 Brow ptosis, bilateral: Secondary | ICD-10-CM | POA: Diagnosis not present

## 2020-03-21 ENCOUNTER — Ambulatory Visit (INDEPENDENT_AMBULATORY_CARE_PROVIDER_SITE_OTHER): Payer: Medicare Other | Admitting: Ophthalmology

## 2020-03-21 ENCOUNTER — Other Ambulatory Visit: Payer: Self-pay

## 2020-03-21 ENCOUNTER — Encounter (INDEPENDENT_AMBULATORY_CARE_PROVIDER_SITE_OTHER): Payer: Self-pay | Admitting: Ophthalmology

## 2020-03-21 DIAGNOSIS — H02834 Dermatochalasis of left upper eyelid: Secondary | ICD-10-CM

## 2020-03-21 DIAGNOSIS — H209 Unspecified iridocyclitis: Secondary | ICD-10-CM

## 2020-03-21 DIAGNOSIS — H57819 Brow ptosis, unspecified: Secondary | ICD-10-CM

## 2020-03-21 DIAGNOSIS — A6920 Lyme disease, unspecified: Secondary | ICD-10-CM

## 2020-03-21 DIAGNOSIS — H3581 Retinal edema: Secondary | ICD-10-CM | POA: Diagnosis not present

## 2020-03-21 DIAGNOSIS — H02831 Dermatochalasis of right upper eyelid: Secondary | ICD-10-CM | POA: Diagnosis not present

## 2020-03-21 DIAGNOSIS — Z961 Presence of intraocular lens: Secondary | ICD-10-CM

## 2020-03-21 DIAGNOSIS — H401321 Pigmentary glaucoma, left eye, mild stage: Secondary | ICD-10-CM

## 2020-03-21 DIAGNOSIS — H4042X0 Glaucoma secondary to eye inflammation, left eye, stage unspecified: Secondary | ICD-10-CM

## 2020-03-22 ENCOUNTER — Ambulatory Visit (INDEPENDENT_AMBULATORY_CARE_PROVIDER_SITE_OTHER): Payer: Medicare Other | Admitting: Ophthalmology

## 2020-03-22 ENCOUNTER — Encounter (INDEPENDENT_AMBULATORY_CARE_PROVIDER_SITE_OTHER): Payer: Self-pay | Admitting: Ophthalmology

## 2020-03-22 DIAGNOSIS — H209 Unspecified iridocyclitis: Secondary | ICD-10-CM

## 2020-03-22 DIAGNOSIS — H3581 Retinal edema: Secondary | ICD-10-CM

## 2020-03-22 DIAGNOSIS — A6920 Lyme disease, unspecified: Secondary | ICD-10-CM

## 2020-03-22 DIAGNOSIS — H4042X0 Glaucoma secondary to eye inflammation, left eye, stage unspecified: Secondary | ICD-10-CM

## 2020-03-22 DIAGNOSIS — Z23 Encounter for immunization: Secondary | ICD-10-CM | POA: Diagnosis not present

## 2020-03-22 DIAGNOSIS — T85398A Other mechanical complication of other ocular prosthetic devices, implants and grafts, initial encounter: Secondary | ICD-10-CM

## 2020-03-22 DIAGNOSIS — H02831 Dermatochalasis of right upper eyelid: Secondary | ICD-10-CM

## 2020-03-22 DIAGNOSIS — Z961 Presence of intraocular lens: Secondary | ICD-10-CM

## 2020-03-22 DIAGNOSIS — H27122 Anterior dislocation of lens, left eye: Secondary | ICD-10-CM | POA: Diagnosis not present

## 2020-03-22 DIAGNOSIS — H401321 Pigmentary glaucoma, left eye, mild stage: Secondary | ICD-10-CM

## 2020-03-22 DIAGNOSIS — H57819 Brow ptosis, unspecified: Secondary | ICD-10-CM

## 2020-03-22 DIAGNOSIS — H02834 Dermatochalasis of left upper eyelid: Secondary | ICD-10-CM

## 2020-03-22 NOTE — Progress Notes (Signed)
Pt here today for IOL calcs.  IOL Master calcs obtained and saved. Will be scanned into Media tab  Gardiner Sleeper, M.D., Ph.D. Diseases & Surgery of the Retina and [place] 03/22/2020

## 2020-03-26 ENCOUNTER — Telehealth: Payer: Self-pay | Admitting: Cardiology

## 2020-03-26 NOTE — Telephone Encounter (Signed)
Received call from Dr. Pleas Koch ([facility]) in regards to mutual patient. Christopher Randolph is scheduled to have procedure  PARS PLANA VITRECTOMY WITH 25G REMOVAL/SUTURE INTRAOCULAR LENS by Dr. Lennox Solders 04/04/2020. Dr. Ernst Bowler contacted Dr. Pleas Koch about a pre op clearance. Dr. Pleas Koch wanted to see if Christopher Randolph would be in agreement with the procedure.  Anesthesia will be general.  [facility] # [phone]   ([phone])

## 2020-03-26 NOTE — Telephone Encounter (Signed)
I saw Christopher Randolph recently in February at which point he was clinically stable from a cardiac perspective on medical therapy.  If this remains the case, I would not anticipate any barriers to him proceeding with surgery as planned.

## 2020-03-27 ENCOUNTER — Other Ambulatory Visit (INDEPENDENT_AMBULATORY_CARE_PROVIDER_SITE_OTHER): Payer: Self-pay

## 2020-03-27 MED ORDER — DORZOLAMIDE HCL-TIMOLOL MAL 2-0.5 % OP SOLN: 1.0000 [drp] | Freq: Two times a day (BID) | OPHTHALMIC | 2 refills | Status: AC

## 2020-03-27 NOTE — Telephone Encounter (Signed)
Correspondence  faxed to [facility] as requested

## 2020-03-31 ENCOUNTER — Other Ambulatory Visit: Payer: Self-pay | Admitting: Cardiology

## 2020-04-01 ENCOUNTER — Other Ambulatory Visit (HOSPITAL_COMMUNITY)
Admission: RE | Admit: 2020-04-01 | Discharge: 2020-04-01 | Disposition: A | Discharge status: Home / Self Care | Payer: Medicare Other | Source: Ambulatory Visit | Attending: Ophthalmology | Admitting: Ophthalmology

## 2020-04-01 ENCOUNTER — Other Ambulatory Visit: Payer: Self-pay

## 2020-04-01 DIAGNOSIS — Z01812 Encounter for preprocedural laboratory examination: Secondary | ICD-10-CM | POA: Diagnosis not present

## 2020-04-01 DIAGNOSIS — Z20822 Contact with and (suspected) exposure to covid-19: Secondary | ICD-10-CM | POA: Insufficient documentation

## 2020-04-01 LAB — SARS CORONAVIRUS 2 (TAT 6-24 HRS): SARS Coronavirus 2: NEGATIVE

## 2020-04-01 NOTE — H&P (Signed)
Christopher Randolph is an 74 y.o. male.    Chief Complaint: displaced IOL and UGH syndrome, left eye  HPI: Pt with a history of recurrent iritis and elevated IOP OS. On dilated exam, was noted to have a displaced IOL causing UGH syndrome. Pt's IOP and symptoms were controlled medically with drops, but more recently have been unable to be controlled medically.  After a discussion of the risks, benefits and alternatives to surgery, the patient has elected to proceed with surgery to remove the dislocated IOL and exchange it for sutured IOL in the left eye, under general anesthesia.  Past Medical History:  Diagnosis Date  . Arthritis   . CAD (coronary artery disease)    Branch vessel and moderate RCA disease 2006  . Cancer ([place]) 1990   Melanoma Lower right Leg  . Carotid artery disease ([place])   . Diabetes ([place])   . Essential hypertension   . Hyperlipidemia   . Hypothyroidism   . Lyme disease   . PAD (peripheral artery disease) ([place])    Left common iliac stent 2004  . PONV (postoperative nausea and vomiting)     Past Surgical History:  Procedure Laterality Date  . CATARACT EXTRACTION Bilateral   . CATARACT EXTRACTION, BILATERAL    . CERVICAL DISC SURGERY    . COLONOSCOPY N/A 11/06/2016   Procedure: COLONOSCOPY;  Surgeon: Daneil Dolin, MD;  [facility];  Service: Endoscopy;  Laterality: N/A;  7:30 AM  . COLONOSCOPY  2012   Dr. Anthony Sar: normal. reviewed reports, which states he has a history of polyps in remote past.   . EYE SURGERY    . JOINT REPLACEMENT    . LUMBAR WOUND DEBRIDEMENT N/A 01/24/2019   Procedure: LUMBAR WOUND Exploration for Evacation of Seroma vs. Hematoma;  Surgeon: Jovita Gamma, MD;  Location: [place];  Service: Neurosurgery;  Laterality: N/A;  . MELANOMA EXCISION     right leg, seen at [facility] and underwent immunotherapy  . NECK SURGERY    . REPLACEMENT TOTAL KNEE      Family History  Problem Relation Age of Onset  . Heart disease Mother   . Heart  attack Mother   . Colon polyps Mother   . Heart attack Maternal Grandmother   . Heart attack Maternal Grandfather   . Colon polyps Brother   . Colon cancer Neg Hx    Social History:  reports that he quit smoking about 16 years ago. His smoking use included cigarettes. He started smoking about 56 years ago. He has a 60.00 pack-year smoking history. He has never used smokeless tobacco. He reports current alcohol use. He reports that he does not use drugs.  Allergies:  Allergies  Allergen Reactions  . Azithromycin Swelling  . Shrimp [Shellfish Allergy] Other (See Comments)    Gout flares    No medications prior to admission.    Review of systems otherwise negative  There were no vitals taken for this visit.  Physical exam: Mental status: oriented x3. Eyes: See eye exam associated with this date of surgery Ears, Nose, Throat: within normal limits Neck: Within Normal limits General: within normal limits Chest: Within normal limits Breast: deferred Heart: Within normal limits Abdomen: Within normal limits GU: deferred Extremities: within normal limits Skin: within normal limits  Assessment/Plan 1. Dislocated IOL with UGH syndrome, LEFT EYE  Plan: To [facility] for 25g PPV w/ IOL exchange and sutured IOL, OS, under general anesthesia - surgical case scheduled for 1130 am,  Thursday, 4.8.21 -- [facility] OR 08   Gardiner Sleeper, M.D., Ph.D. Vitreoretinal Surgeon [facility]

## 2020-04-03 ENCOUNTER — Other Ambulatory Visit: Payer: Self-pay

## 2020-04-03 ENCOUNTER — Encounter (HOSPITAL_COMMUNITY): Payer: Self-pay | Admitting: Ophthalmology

## 2020-04-03 NOTE — Anesthesia Preprocedure Evaluation (Addendum)
Anesthesia Evaluation  Patient identified by MRN, date of birth, ID band Patient awake    Reviewed: NPO status , Patient's Chart, lab work & pertinent test results, reviewed documented beta blocker date and time   History of Anesthesia Complications (+) PONV and history of anesthetic complications  Airway Mallampati: II  TM Distance: >3 FB Neck ROM: Full    Dental no notable dental hx. (+) Teeth Intact, Dental Advisory Given   Pulmonary former smoker,    Pulmonary exam normal breath sounds clear to auscultation       Cardiovascular hypertension, Pt. on medications + CAD and + Peripheral Vascular Disease  Normal cardiovascular exam Rhythm:Regular Rate:Normal     Neuro/Psych negative neurological ROS  negative psych ROS   GI/Hepatic Neg liver ROS,   Endo/Other  diabetesHypothyroidism   Renal/GU      Musculoskeletal  (+) Arthritis ,   Abdominal   Peds  Hematology   Anesthesia Other Findings   Reproductive/Obstetrics                           Anesthesia Physical Anesthesia Plan  ASA: III  Anesthesia Plan: General   Post-op Pain Management:    Induction: Intravenous  PONV Risk Score and Plan: Treatment may vary due to age or medical condition  Airway Management Planned: Oral ETT  Additional Equipment: None  Intra-op Plan:   Post-operative Plan: Extubation in OR  Informed Consent: I have reviewed the patients History and Physical, chart, labs and discussed the procedure including the risks, benefits and alternatives for the proposed anesthesia with the patient or authorized representative who has indicated his/her understanding and acceptance.     Dental advisory given  Plan Discussed with:   Anesthesia Plan Comments: (Follows with cardiology for hx of branch vessel CAD with moderate RCA disease, being managed medically, HTN, and moderate carotid disease. Per telephone  encounter by Dr. Domenic Polite 03/26/20, "I saw Mr. Arrizon recently in February at which point he was clinically stable from a cardiac perspective on medical therapy.  If this remains the case, I would not anticipate any barriers to him proceeding with surgery as planned."  Will need DOS labs and eval.   EKG 01/30/20: Sinus  Rhythm. Rate 63. Low voltage in precordial leads.  Nonspecific ST/T changes.  Carotid Dopplers 09/06/2019: Summary: Right Carotid: Velocities in the right ICA are consistent with a 60-79% stenosis.  Left Carotid: Velocities in the left ICA are consistent with a 40-59% stenosis. High end of range, likely stable.  Vertebrals: Bilateral vertebral arteries demonstrate antegrade flow. Subclavians: Normal flow hemodynamics were seen in bilateral subclavian arteries.  Nuclear stress test 07/29/2018 [facility]): Impression:  1.  No reversible ischemia or infarction. 2.  Normal left ventricular wall motion. 3.  Left ventricular ejection fraction 72%. 4.  Noninvasive risk stratification: Low.  Echocardiogram 12/06/2017([facility]): Mild LVH with LVEF 55 to [id number], normal diastolic function, mild left atrial enlargement, normal right ventricular contraction, no major valvular abnormalities. )      Anesthesia Quick Evaluation

## 2020-04-03 NOTE — Progress Notes (Signed)
Patient denies shortness of breath, fever, cough and chest pain.  PCP - Dr Pleas Koch Cardiologist - Dr Rozann Lesches  Chest x-ray - n/a EKG - 01/30/20 Stress Test - 08/22/14 ECHO - 12/06/17 Cardiac Cath - 05/19/05  Fasting Blood Sugar - 120-130s Checks Blood Sugar 1 times a day . Do not take oral diabetes medicines (Metformin) the morning of surgery.  . If your blood sugar is less than 70 mg/dL, you will need to treat for low blood sugar: o Treat a low blood sugar (less than 70 mg/dL) with  cup of clear juice (cranberry or apple), 4 glucose tablets, OR glucose gel. o Recheck blood sugar in 15 minutes after treatment (to make sure it is greater than 70 mg/dL). If your blood sugar is not greater than 70 mg/dL on recheck, call [phone] for further instructions.  Aspirin Instructions: Last dose on 03/30/20.  Anesthesia review: Yes  STOP now taking any Aspirin (unless otherwise instructed by your surgeon), Aleve, Naproxen, Ibuprofen, Motrin, Advil, Goody's, BC's, all herbal medications, fish oil, and all vitamins.   Coronavirus Screening Covid trest on 04/01/20 was negative.  Patient verbalized understanding of instructions that were given via phone.

## 2020-04-03 NOTE — Progress Notes (Signed)
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 Per telephone encounter by Dr. Domenic Polite 03/26/20, "I saw Christopher Randolph recently in February at which point he was clinically stable from a cardiac perspective on medical therapy.  If this remains the case, I would not anticipate any barriers to him proceeding with surgery as planned."  Will need DOS labs and eval.   EKG 01/30/20: Sinus  Rhythm. Rate 63. Low voltage in precordial leads.  Nonspecific ST/T changes.  Carotid Dopplers 09/06/2019: Summary: Right Carotid: Velocities in the right ICA are consistent with a 60-79% stenosis.  Left Carotid: Velocities in the left ICA are consistent with a 40-59% stenosis. High end of range, likely stable.  Vertebrals: Bilateral vertebral arteries demonstrate antegrade flow. Subclavians: Normal flow hemodynamics were seen in bilateral subclavian arteries.  Nuclear stress test 07/29/2018 [facility]): Impression:  1.  No reversible ischemia or infarction. 2.  Normal left ventricular wall motion. 3.  Left ventricular ejection fraction 72%. 4.  Noninvasive risk stratification: Low.  Echocardiogram 12/06/2017([facility]): Mild LVH with LVEF 55 to [id number], normal diastolic function, mild left atrial enlargement, normal right ventricular contraction, no major valvular abnormalities.   Christopher [facility]/Anesthesiology Phone [phone] 04/03/2020 2:38 PM

## 2020-04-04 ENCOUNTER — Encounter (HOSPITAL_COMMUNITY)
Admission: RE | Disposition: A | Discharge status: Home / Self Care | Payer: Self-pay | Source: Home / Self Care | Attending: Ophthalmology

## 2020-04-04 ENCOUNTER — Other Ambulatory Visit: Payer: Self-pay

## 2020-04-04 ENCOUNTER — Ambulatory Visit (HOSPITAL_COMMUNITY)
Admission: RE | Admit: 2020-04-04 | Discharge: 2020-04-04 | Disposition: A | Discharge status: Home / Self Care | Payer: Medicare Other | Attending: Ophthalmology | Admitting: Ophthalmology

## 2020-04-04 ENCOUNTER — Ambulatory Visit (HOSPITAL_COMMUNITY): Payer: Medicare Other | Admitting: Physician Assistant

## 2020-04-04 ENCOUNTER — Encounter (HOSPITAL_COMMUNITY): Payer: Self-pay | Admitting: Ophthalmology

## 2020-04-04 DIAGNOSIS — Z8249 Family history of ischemic heart disease and other diseases of the circulatory system: Secondary | ICD-10-CM | POA: Diagnosis not present

## 2020-04-04 DIAGNOSIS — Z8371 Family history of colonic polyps: Secondary | ICD-10-CM | POA: Insufficient documentation

## 2020-04-04 DIAGNOSIS — E1151 Type 2 diabetes mellitus with diabetic peripheral angiopathy without gangrene: Secondary | ICD-10-CM | POA: Diagnosis not present

## 2020-04-04 DIAGNOSIS — Z9842 Cataract extraction status, left eye: Secondary | ICD-10-CM | POA: Insufficient documentation

## 2020-04-04 DIAGNOSIS — H27122 Anterior dislocation of lens, left eye: Secondary | ICD-10-CM

## 2020-04-04 DIAGNOSIS — H5989 Other postprocedural complications and disorders of eye and adnexa, not elsewhere classified: Secondary | ICD-10-CM | POA: Diagnosis not present

## 2020-04-04 DIAGNOSIS — H4302 Vitreous prolapse, left eye: Secondary | ICD-10-CM

## 2020-04-04 DIAGNOSIS — Z8619 Personal history of other infectious and parasitic diseases: Secondary | ICD-10-CM | POA: Diagnosis not present

## 2020-04-04 DIAGNOSIS — X58XXXA Exposure to other specified factors, initial encounter: Secondary | ICD-10-CM | POA: Diagnosis not present

## 2020-04-04 DIAGNOSIS — Z9841 Cataract extraction status, right eye: Secondary | ICD-10-CM | POA: Diagnosis not present

## 2020-04-04 DIAGNOSIS — Z87891 Personal history of nicotine dependence: Secondary | ICD-10-CM | POA: Insufficient documentation

## 2020-04-04 DIAGNOSIS — Z85820 Personal history of malignant melanoma of skin: Secondary | ICD-10-CM | POA: Diagnosis not present

## 2020-04-04 DIAGNOSIS — T8522XA Displacement of intraocular lens, initial encounter: Secondary | ICD-10-CM | POA: Diagnosis not present

## 2020-04-04 DIAGNOSIS — E119 Type 2 diabetes mellitus without complications: Secondary | ICD-10-CM | POA: Diagnosis not present

## 2020-04-04 DIAGNOSIS — I10 Essential (primary) hypertension: Secondary | ICD-10-CM | POA: Insufficient documentation

## 2020-04-04 DIAGNOSIS — E039 Hypothyroidism, unspecified: Secondary | ICD-10-CM | POA: Insufficient documentation

## 2020-04-04 DIAGNOSIS — Z91013 Allergy to seafood: Secondary | ICD-10-CM | POA: Diagnosis not present

## 2020-04-04 DIAGNOSIS — Z881 Allergy status to other antibiotic agents status: Secondary | ICD-10-CM | POA: Diagnosis not present

## 2020-04-04 DIAGNOSIS — M1990 Unspecified osteoarthritis, unspecified site: Secondary | ICD-10-CM | POA: Diagnosis not present

## 2020-04-04 DIAGNOSIS — E785 Hyperlipidemia, unspecified: Secondary | ICD-10-CM | POA: Insufficient documentation

## 2020-04-04 DIAGNOSIS — I2510 Atherosclerotic heart disease of native coronary artery without angina pectoris: Secondary | ICD-10-CM | POA: Insufficient documentation

## 2020-04-04 DIAGNOSIS — T85898A Other specified complication of other internal prosthetic devices, implants and grafts, initial encounter: Secondary | ICD-10-CM | POA: Diagnosis present

## 2020-04-04 DIAGNOSIS — Z961 Presence of intraocular lens: Secondary | ICD-10-CM | POA: Insufficient documentation

## 2020-04-04 DIAGNOSIS — T8522XS Displacement of intraocular lens, sequela: Secondary | ICD-10-CM

## 2020-04-04 HISTORY — DX: Unspecified glaucoma: H40.9

## 2020-04-04 HISTORY — PX: PARS PLANA VITRECTOMY: SHX2166

## 2020-04-04 HISTORY — DX: Presence of dental prosthetic device (complete) (partial): Z97.2

## 2020-04-04 HISTORY — DX: Presence of spectacles and contact lenses: Z97.3

## 2020-04-04 HISTORY — DX: Pneumonia, unspecified organism: J18.9

## 2020-04-04 LAB — BASIC METABOLIC PANEL
Anion gap: 10 (ref 5–15)
BUN: 17 mg/dL (ref 8–23)
CO2: 18 mmol/L — ABNORMAL LOW (ref 22–32)
Calcium: 8.7 mg/dL — ABNORMAL LOW (ref 8.9–10.3)
Chloride: 116 mmol/L — ABNORMAL HIGH (ref 98–111)
Creatinine, Ser: 0.95 mg/dL (ref 0.61–1.24)
GFR calc Af Amer: >60 mL/min (ref >60)
GFR calc non Af Amer: >60 mL/min (ref >60)
Glucose, Bld: 151 mg/dL — ABNORMAL HIGH (ref 70–99)
Potassium: 3.8 mmol/L (ref 3.5–5.1)
Sodium: 144 mmol/L (ref 135–145)

## 2020-04-04 LAB — HEMOGLOBIN AND HEMATOCRIT, BLOOD
HCT: 45.5 % (ref 39.0–52.0)
Hemoglobin: 14.5 g/dL (ref 13.0–17.0)

## 2020-04-04 LAB — GLUCOSE, CAPILLARY
Glucose-Capillary: 149 mg/dL — ABNORMAL HIGH (ref 70–99)
Glucose-Capillary: 119 mg/dL — ABNORMAL HIGH (ref 70–99)
Glucose-Capillary: 149 mg/dL — ABNORMAL HIGH (ref 70–99)

## 2020-04-04 SURGERY — PARS PLANA VITRECTOMY WITH 25G REMOVAL/SUTURE INTRAOCULAR LENS
Anesthesia: General | Site: Eye | Laterality: Left

## 2020-04-04 MED ORDER — GATIFLOXACIN 0.5 % OP SOLN OPTIME - NO CHARGE
OPHTHALMIC | Status: DC | PRN
  Administered 2020-04-04: 1 [drp] via OPHTHALMIC

## 2020-04-04 MED ORDER — TRIAMCINOLONE ACETONIDE 40 MG/ML IJ SUSP
INTRAMUSCULAR | Status: DC | PRN
  Administered 2020-04-04: 40 mg

## 2020-04-04 MED ORDER — PHENYLEPHRINE HCL (PRESSORS) 10 MG/ML IV SOLN
INTRAVENOUS | Status: DC | PRN
  Administered 2020-04-04: 120 ug via INTRAVENOUS

## 2020-04-04 MED ORDER — LIDOCAINE 2% (20 MG/ML) 5 ML SYRINGE
INTRAMUSCULAR | Status: DC | PRN
  Administered 2020-04-04: 100 mg via INTRAVENOUS

## 2020-04-04 MED ORDER — EPHEDRINE 5 MG/ML INJ
INTRAVENOUS | Status: AC
  Filled 2020-04-04: qty 20

## 2020-04-04 MED ORDER — EPINEPHRINE PF 1 MG/ML IJ SOLN
INTRAMUSCULAR | Status: AC
  Filled 2020-04-04: qty 1

## 2020-04-04 MED ORDER — BSS PLUS IO SOLN
INTRAOCULAR | Status: AC
  Filled 2020-04-04: qty 500

## 2020-04-04 MED ORDER — ONDANSETRON HCL 4 MG/2ML IJ SOLN: 4.0000 mg | Freq: Once | INTRAMUSCULAR | Status: DC | PRN

## 2020-04-04 MED ORDER — CYCLOPENTOLATE HCL 1 % OP SOLN
1.0000 [drp] | OPHTHALMIC | Status: AC | PRN
  Administered 2020-04-04 (×3): 1 [drp] via OPHTHALMIC
  Filled 2020-04-04: qty 2

## 2020-04-04 MED ORDER — SUCCINYLCHOLINE CHLORIDE 200 MG/10ML IV SOSY
PREFILLED_SYRINGE | INTRAVENOUS | Status: AC
  Filled 2020-04-04: qty 10

## 2020-04-04 MED ORDER — EPHEDRINE 5 MG/ML INJ
INTRAVENOUS | Status: AC
  Filled 2020-04-04: qty 10

## 2020-04-04 MED ORDER — KETOROLAC TROMETHAMINE 0.5 % OP SOLN
1.0000 [drp] | OPHTHALMIC | Status: AC
  Administered 2020-04-04: 11:00:00 1 [drp] via OPHTHALMIC
  Filled 2020-04-04: qty 5

## 2020-04-04 MED ORDER — EPINEPHRINE PF 1 MG/ML IJ SOLN
INTRAMUSCULAR | Status: DC | PRN
  Administered 2020-04-04: .3 mL

## 2020-04-04 MED ORDER — NA CHONDROIT SULF-NA HYALURON 40-30 MG/ML IO SOLN
INTRAOCULAR | Status: AC
  Filled 2020-04-04: qty 1.5

## 2020-04-04 MED ORDER — PREDNISOLONE ACETATE 1 % OP SUSP
OPHTHALMIC | Status: DC | PRN
  Administered 2020-04-04: 1 [drp] via OPHTHALMIC

## 2020-04-04 MED ORDER — DEXAMETHASONE SODIUM PHOSPHATE 10 MG/ML IJ SOLN
INTRAMUSCULAR | Status: DC | PRN
  Administered 2020-04-04: 5 mg via INTRAVENOUS

## 2020-04-04 MED ORDER — PROPOFOL 10 MG/ML IV BOLUS
INTRAVENOUS | Status: AC
  Filled 2020-04-04: qty 20

## 2020-04-04 MED ORDER — LIDOCAINE 2% (20 MG/ML) 5 ML SYRINGE
INTRAMUSCULAR | Status: AC
  Filled 2020-04-04: qty 5

## 2020-04-04 MED ORDER — PROPARACAINE HCL 0.5 % OP SOLN
1.0000 [drp] | OPHTHALMIC | Status: AC | PRN
  Administered 2020-04-04 (×3): 1 [drp] via OPHTHALMIC
  Filled 2020-04-04: qty 15

## 2020-04-04 MED ORDER — FENTANYL CITRATE (PF) 250 MCG/5ML IJ SOLN
INTRAMUSCULAR | Status: DC | PRN
  Administered 2020-04-04: 100 ug via INTRAVENOUS
  Administered 2020-04-04: 50 ug via INTRAVENOUS

## 2020-04-04 MED ORDER — PHENYLEPHRINE 40 MCG/ML (10ML) SYRINGE FOR IV PUSH (FOR BLOOD PRESSURE SUPPORT)
PREFILLED_SYRINGE | INTRAVENOUS | Status: AC
  Filled 2020-04-04: qty 10

## 2020-04-04 MED ORDER — LACTATED RINGERS IV SOLN: INTRAVENOUS | Status: DC

## 2020-04-04 MED ORDER — PHENYLEPHRINE HCL-NACL 10-0.9 MG/250ML-% IV SOLN
INTRAVENOUS | Status: DC | PRN
  Administered 2020-04-04: 25 ug/min via INTRAVENOUS

## 2020-04-04 MED ORDER — STERILE WATER FOR INJECTION IJ SOLN
INTRAMUSCULAR | Status: DC | PRN
  Administered 2020-04-04: 20 mL

## 2020-04-04 MED ORDER — STERILE WATER FOR INJECTION IJ SOLN
INTRAMUSCULAR | Status: AC
  Filled 2020-04-04: qty 10

## 2020-04-04 MED ORDER — MIDAZOLAM HCL 2 MG/2ML IJ SOLN
INTRAMUSCULAR | Status: DC | PRN
  Administered 2020-04-04: 2 mg via INTRAVENOUS

## 2020-04-04 MED ORDER — ROCURONIUM BROMIDE 10 MG/ML (PF) SYRINGE
PREFILLED_SYRINGE | INTRAVENOUS | Status: DC | PRN
  Administered 2020-04-04: 40 mg via INTRAVENOUS

## 2020-04-04 MED ORDER — PREDNISOLONE ACETATE 1 % OP SUSP
OPHTHALMIC | Status: AC
  Filled 2020-04-04: qty 5

## 2020-04-04 MED ORDER — BSS IO SOLN
INTRAOCULAR | Status: AC
  Filled 2020-04-04: qty 15

## 2020-04-04 MED ORDER — MIDAZOLAM HCL 2 MG/2ML IJ SOLN
INTRAMUSCULAR | Status: AC
  Filled 2020-04-04: qty 2

## 2020-04-04 MED ORDER — PHENYLEPHRINE HCL (PRESSORS) 10 MG/ML IV SOLN
INTRAVENOUS | Status: AC
  Filled 2020-04-04: qty 1

## 2020-04-04 MED ORDER — EPHEDRINE SULFATE-NACL 50-0.9 MG/10ML-% IV SOSY
PREFILLED_SYRINGE | INTRAVENOUS | Status: DC | PRN
  Administered 2020-04-04 (×2): 10 mg via INTRAVENOUS
  Administered 2020-04-04 (×3): 15 mg via INTRAVENOUS

## 2020-04-04 MED ORDER — ATROPINE SULFATE 1 % OP SOLN
1.0000 [drp] | OPHTHALMIC | Status: AC | PRN
  Administered 2020-04-04 (×3): 1 [drp] via OPHTHALMIC
  Filled 2020-04-04: qty 5

## 2020-04-04 MED ORDER — TROPICAMIDE 1 % OP SOLN
1.0000 [drp] | OPHTHALMIC | Status: AC | PRN
  Administered 2020-04-04 (×3): 1 [drp] via OPHTHALMIC
  Filled 2020-04-04: qty 15

## 2020-04-04 MED ORDER — BSS PLUS IO SOLN
INTRAOCULAR | Status: DC | PRN
  Administered 2020-04-04: 1 via INTRAOCULAR

## 2020-04-04 MED ORDER — PHENYLEPHRINE HCL 10 % OP SOLN
1.0000 [drp] | OPHTHALMIC | Status: AC | PRN
  Administered 2020-04-04 (×3): 1 [drp] via OPHTHALMIC
  Filled 2020-04-04: qty 5

## 2020-04-04 MED ORDER — DORZOLAMIDE HCL-TIMOLOL MAL 2-0.5 % OP SOLN
OPHTHALMIC | Status: AC
  Filled 2020-04-04: qty 10

## 2020-04-04 MED ORDER — ONDANSETRON HCL 4 MG/2ML IJ SOLN
INTRAMUSCULAR | Status: DC | PRN
  Administered 2020-04-04: 4 mg via INTRAVENOUS

## 2020-04-04 MED ORDER — PROPOFOL 10 MG/ML IV BOLUS
INTRAVENOUS | Status: DC | PRN
  Administered 2020-04-04: 120 mg via INTRAVENOUS

## 2020-04-04 MED ORDER — NA CHONDROIT SULF-NA HYALURON 40-30 MG/ML IO SOLN
INTRAOCULAR | Status: DC | PRN
  Administered 2020-04-04 (×2): 0.5 mL via INTRAOCULAR

## 2020-04-04 MED ORDER — BACITRACIN-POLYMYXIN B 500-10000 UNIT/GM OP OINT
TOPICAL_OINTMENT | OPHTHALMIC | Status: AC
  Filled 2020-04-04: qty 3.5

## 2020-04-04 MED ORDER — SODIUM CHLORIDE 0.9 % IV SOLN: INTRAVENOUS | Status: DC

## 2020-04-04 MED ORDER — ONDANSETRON HCL 4 MG/2ML IJ SOLN
INTRAMUSCULAR | Status: AC
  Filled 2020-04-04: qty 2

## 2020-04-04 MED ORDER — DORZOLAMIDE HCL-TIMOLOL MAL 2-0.5 % OP SOLN
OPHTHALMIC | Status: DC | PRN
  Administered 2020-04-04: 1 [drp] via OPHTHALMIC

## 2020-04-04 MED ORDER — TRIAMCINOLONE ACETONIDE 40 MG/ML IJ SUSP
INTRAMUSCULAR | Status: AC
  Filled 2020-04-04: qty 5

## 2020-04-04 MED ORDER — FENTANYL CITRATE (PF) 100 MCG/2ML IJ SOLN: 25.0000 ug | INTRAMUSCULAR | Status: DC | PRN

## 2020-04-04 MED ORDER — BRIMONIDINE TARTRATE 0.2 % OP SOLN
OPHTHALMIC | Status: DC | PRN
  Administered 2020-04-04: 1 [drp] via OPHTHALMIC

## 2020-04-04 MED ORDER — BACITRACIN-POLYMYXIN B 500-10000 UNIT/GM OP OINT
TOPICAL_OINTMENT | OPHTHALMIC | Status: DC | PRN
  Administered 2020-04-04: 1 via OPHTHALMIC

## 2020-04-04 MED ORDER — FENTANYL CITRATE (PF) 250 MCG/5ML IJ SOLN
INTRAMUSCULAR | Status: AC
  Filled 2020-04-04: qty 5

## 2020-04-04 MED ORDER — SUGAMMADEX SODIUM 200 MG/2ML IV SOLN
INTRAVENOUS | Status: DC | PRN
  Administered 2020-04-04: 200 mg via INTRAVENOUS

## 2020-04-04 MED ORDER — SUGAMMADEX SODIUM 200 MG/2ML IV SOLN: INTRAVENOUS | Status: DC | PRN

## 2020-04-04 MED ORDER — DEXAMETHASONE SODIUM PHOSPHATE 10 MG/ML IJ SOLN
INTRAMUSCULAR | Status: AC
  Filled 2020-04-04: qty 1

## 2020-04-04 MED ORDER — BRIMONIDINE TARTRATE 0.2 % OP SOLN
OPHTHALMIC | Status: AC
  Filled 2020-04-04: qty 5

## 2020-04-04 MED ORDER — POLYMYXIN B SULFATE 500000 UNITS IJ SOLR
INTRAMUSCULAR | Status: AC
  Filled 2020-04-04: qty 500000

## 2020-04-04 MED ORDER — SODIUM CHLORIDE (PF) 0.9 % IJ SOLN
INTRAMUSCULAR | Status: AC
  Filled 2020-04-04: qty 10

## 2020-04-04 MED ORDER — GATIFLOXACIN 0.5 % OP SOLN
OPHTHALMIC | Status: AC
  Filled 2020-04-04: qty 2.5

## 2020-04-04 MED ORDER — CEFTAZIDIME 1 G IJ SOLR
INTRAMUSCULAR | Status: AC
  Filled 2020-04-04: qty 1

## 2020-04-04 MED ORDER — ACETAMINOPHEN 10 MG/ML IV SOLN: 1000.0000 mg | Freq: Once | INTRAVENOUS | Status: DC | PRN

## 2020-04-04 SURGICAL SUPPLY — 90 items
APL SWBSTK 6 STRL LF DISP (MISCELLANEOUS) ×10
APPLICATOR COTTON TIP 6 STRL (MISCELLANEOUS) ×10 IMPLANT
APPLICATOR COTTON TIP 6IN STRL (MISCELLANEOUS) ×30
BAND WRIST GAS GREEN (MISCELLANEOUS) IMPLANT
BETADINE 5% OPHTHALMIC (OPHTHALMIC) ×2 IMPLANT
BLADE EYE CATARACT 19 1.4 BEAV (BLADE) IMPLANT
BLADE MVR KNIFE 20G (BLADE) ×3 IMPLANT
BLADE STAB KNIFE 15DEG (BLADE) ×2 IMPLANT
BNDG EYE OVAL (GAUZE/BANDAGES/DRESSINGS) ×5 IMPLANT
CABLE BIPOLOR RESECTION CORD (MISCELLANEOUS) ×3 IMPLANT
CANNULA ANT CHAM MAIN (OPHTHALMIC RELATED) IMPLANT
CANNULA ANT/CHMB 27G (MISCELLANEOUS) IMPLANT
CANNULA ANT/CHMB 27GA (MISCELLANEOUS) ×6 IMPLANT
CANNULA FLEX TIP 25G (CANNULA) ×3 IMPLANT
CANNULA TROCAR 23 GA VLV (OPHTHALMIC) IMPLANT
CANNULA TROCAR 23G VLV (OPHTHALMIC) IMPLANT
CANNULA TROCAR 25 GA VLV (OPHTHALMIC) ×3 IMPLANT
CANNULA TROCAR 25G 4 VLV (OPHTHALMIC) IMPLANT
CANNULA VLV SOFT TIP 25G (OPHTHALMIC) IMPLANT
CANNULA VLV SOFT TIP 25GA (OPHTHALMIC) IMPLANT
CLOSURE STERI-STRIP 1/2X4 (GAUZE/BANDAGES/DRESSINGS) ×1
CLSR STERI-STRIP ANTIMIC 1/2X4 (GAUZE/BANDAGES/DRESSINGS) ×2 IMPLANT
COVER WAND RF STERILE (DRAPES) ×3 IMPLANT
DRAPE MICROSCOPE LEICA 46X105 (MISCELLANEOUS) ×3 IMPLANT
DRAPE OPHTHALMIC 77X100 STRL (CUSTOM PROCEDURE TRAY) ×3 IMPLANT
ERASER HMR WETFIELD 23G BP (MISCELLANEOUS) IMPLANT
FILTER BLUE MILLIPORE (MISCELLANEOUS) IMPLANT
FILTER STRAW FLUID ASPIR (MISCELLANEOUS) IMPLANT
FORCEPS GRIESHABER ILM 25G A (INSTRUMENTS) IMPLANT
GAS AUTO FILL CONSTEL (OPHTHALMIC)
GAS AUTO FILL CONSTELLATION (OPHTHALMIC) IMPLANT
GAS WRIST BAND GREEN (MISCELLANEOUS)
GLOVE BIO SURGEON STRL SZ7.5 (GLOVE) ×6 IMPLANT
GLOVE BIOGEL M 7.0 STRL (GLOVE) ×3 IMPLANT
GOWN STRL REUS W/ TWL LRG LVL3 (GOWN DISPOSABLE) ×2 IMPLANT
GOWN STRL REUS W/ TWL XL LVL3 (GOWN DISPOSABLE) ×1 IMPLANT
GOWN STRL REUS W/TWL LRG LVL3 (GOWN DISPOSABLE) ×6
GOWN STRL REUS W/TWL XL LVL3 (GOWN DISPOSABLE) ×3
KIT BASIN OR (CUSTOM PROCEDURE TRAY) ×3 IMPLANT
KIT PERFLUORON PROCEDURE 5ML (MISCELLANEOUS) IMPLANT
LENS BIOM SUPER VIEW SET DISP (MISCELLANEOUS) IMPLANT
LENS IOL AKREOS 21.0 (Intraocular Lens) ×2 IMPLANT
LENS VITRECTOMY FLAT OCLR DISP (MISCELLANEOUS) IMPLANT
MICROPICK 25G (MISCELLANEOUS)
NDL 18GX1X1/2 (RX/OR ONLY) (NEEDLE) ×1 IMPLANT
NDL 25GX 5/8IN NON SAFETY (NEEDLE) ×5 IMPLANT
NDL HYPO 30X.5 LL (NEEDLE) ×2 IMPLANT
NDL PRECISIONGLIDE 27X1.5 (NEEDLE) IMPLANT
NEEDLE 18GX1X1/2 (RX/OR ONLY) (NEEDLE) ×3 IMPLANT
NEEDLE 25GX 5/8IN NON SAFETY (NEEDLE) ×15 IMPLANT
NEEDLE HYPO 30X.5 LL (NEEDLE) ×6 IMPLANT
NEEDLE PRECISIONGLIDE 27X1.5 (NEEDLE) IMPLANT
NS IRRIG 1000ML POUR BTL (IV SOLUTION) ×3 IMPLANT
OPHTHALMIC BETADINE 5% (OPHTHALMIC) ×6
PACK VITRECTOMY CUSTOM (CUSTOM PROCEDURE TRAY) ×3 IMPLANT
PAD ARMBOARD 7.5X6 YLW CONV (MISCELLANEOUS) ×6 IMPLANT
PAK PIK VITRECTOMY CVS 25GA (OPHTHALMIC) ×3 IMPLANT
PENCIL BIPOLAR 25GA STR DISP (OPHTHALMIC RELATED) ×3 IMPLANT
PICK MICROPICK 25G (MISCELLANEOUS) IMPLANT
PROBE DIATHERMY DSP 27GA (MISCELLANEOUS) IMPLANT
PROBE ENDO DIATHERMY 25G (MISCELLANEOUS) ×3 IMPLANT
PROBE LASER ILLUM FLEX CVD 25G (OPHTHALMIC) IMPLANT
REPL STRA BRUSH NDL (NEEDLE) IMPLANT
REPL STRA BRUSH NEEDLE (NEEDLE) IMPLANT
RESERVOIR BACK FLUSH (MISCELLANEOUS) IMPLANT
RETRACTOR IRIS FLEX 25G GRIESH (INSTRUMENTS) IMPLANT
SHIELD EYE LENSE ONLY DISP (GAUZE/BANDAGES/DRESSINGS) ×2 IMPLANT
SPEAR EYE SURG WECK-CEL (MISCELLANEOUS) ×8 IMPLANT
SPONGE SURGIFOAM ABS GEL 12-7 (HEMOSTASIS) IMPLANT
STOPCOCK 4 WAY LG BORE MALE ST (IV SETS) IMPLANT
SUT CHROMIC 7 0 TG140 8 (SUTURE) ×3 IMPLANT
SUT ETHILON 10 0 BV75 4 (SUTURE) IMPLANT
SUT ETHILON 10 0 CS140 6 (SUTURE) ×2 IMPLANT
SUT ETHILON 5.0 S-24 (SUTURE) ×3 IMPLANT
SUT ETHILON 9 0 TG140 8 (SUTURE) ×3 IMPLANT
SUT MERSILENE 4 0 RV 2 (SUTURE) ×3 IMPLANT
SUT SILK 2 0 (SUTURE) ×3
SUT SILK 2 0 TIES 17X18 (SUTURE) ×3
SUT SILK 2-0 18XBRD TIE 12 (SUTURE) ×1 IMPLANT
SUT SILK 2-0 18XBRD TIE BLK (SUTURE) ×1 IMPLANT
SUT SILK 4 0 RB 1 (SUTURE) ×3 IMPLANT
SUT VICRYL 7 0 TG140 8 (SUTURE) ×3 IMPLANT
SYR 10ML LL (SYRINGE) ×3 IMPLANT
SYR 20ML LL LF (SYRINGE) ×3 IMPLANT
SYR 5ML LL (SYRINGE) ×3 IMPLANT
SYR BULB 3OZ (MISCELLANEOUS) ×3 IMPLANT
SYR TB 1ML LUER SLIP (SYRINGE) IMPLANT
TOWEL GREEN STERILE FF (TOWEL DISPOSABLE) ×3 IMPLANT
TUBING HIGH PRESS EXTEN 6IN (TUBING) ×3 IMPLANT
WATER STERILE IRR 1000ML POUR (IV SOLUTION) ×3 IMPLANT

## 2020-04-04 NOTE — Progress Notes (Signed)
[facility] Note  04/05/2020     CHIEF COMPLAINT Patient presents for Retina Follow Up   HISTORY OF PRESENT ILLNESS: Christopher Randolph is a 74 y.o. male who presents to the clinic today for:   HPI    Retina Follow Up    Patient presents with  Other.  In left eye.  This started 1 day ago.  Severity is moderate.  I, the attending physician,  performed the HPI with the patient and updated documentation appropriately.          Comments    Patient here for 1 day retina follow up for s/p IOL exchange OS. Patient states vision blurry. Has a little bit of eye pain. It burns, itches feels like grit in eye. Feels like an eyelash in eye.        Last edited by [name], MD on 04/07/2020 11:21 PM. (History)    Patient states his eye is burning and itching, he states his vision is "foggy"  Referring physician: Curlene Labrum, MD [place],  [place] [address]  HISTORICAL INFORMATION:   Selected notes from the MEDICAL RECORD NUMBER Referred by Dr. [name] for concern of vitreous hemorrhage LEE: 05.21.19 [name]) [BCVA: OD: OS: ] Ocular Hx-Psedophakia OU,  PMH-DM (on Metformin), Arthritis     CURRENT MEDICATIONS: Current Outpatient Medications (Ophthalmic Drugs)  Medication Sig  . brimonidine (ALPHAGAN) 0.2 % ophthalmic solution Place 1 drop into the left eye 3 (three) times daily.  . dorzolamide-timolol (COSOPT) 22.3-6.8 MG/ML ophthalmic solution Place 1 drop into the left eye 2 (two) times daily.  . Loteprednol Etabonate (INVELTYS) 1 % SUSP Apply 1 drop to eye 2 (two) times a day. 1 drop 2 times daily left eye (Patient taking differently: Place 1 drop into the left eye in the morning and at bedtime. )  . Netarsudil-Latanoprost (ROCKLATAN) 0.02-0.005 % SOLN Place 1 drop into the left eye at bedtime.   No current facility-administered medications for this visit. (Ophthalmic Drugs)   Current Outpatient Medications (Other)  Medication Sig   . acetaZOLAMIDE (DIAMOX) 250 MG tablet Take 250 mg by mouth 3 (three) times daily.  Marland Kitchen aspirin EC 81 MG tablet Take 81 mg by mouth daily.    Marland Kitchen atorvastatin (LIPITOR) 20 MG tablet TAKE 1 TABLET BY MOUTH DAILY (Patient taking differently: Take 20 mg by mouth daily. )  . fluticasone (FLONASE ALLERGY RELIEF) 50 MCG/ACT nasal spray Place 2 sprays into both nostrils daily.   Marland Kitchen levothyroxine (SYNTHROID, LEVOTHROID) 150 MCG tablet Take 150 mcg by mouth See admin instructions. Alternates taking 150 mg daily for 2 days then 175 mcg the third day then repeat  . levothyroxine (SYNTHROID, LEVOTHROID) 175 MCG tablet Take 175 mcg by mouth See admin instructions. Alternates taking 150 mg daily for 2 days then 175 mcg the third day then repeat  . metFORMIN (GLUCOPHAGE) 500 MG tablet Take 500 mg by mouth daily.   . metoprolol tartrate (LOPRESSOR) 25 MG tablet Take 25 mg by mouth 2 (two) times daily.   . mometasone (ELOCON) 0.1 % ointment Apply 1 application topically daily.   . naproxen sodium (ANAPROX) 220 MG tablet Take 220 mg by mouth daily as needed (for pain.).   Marland Kitchen nitroGLYCERIN (NITROSTAT) 0.4 MG SL tablet DISSOLVE 1 TABLET UNDER THE TONGUE EVERY 5 MINUTES AS NEEDED FOR CHEST PAIN. DO NOT EXCEED A TOTAL OF 3 DOSES IN 15 MINUTES.  . tamsulosin (FLOMAX) 0.4 MG CAPS  capsule Take 0.4 mg by mouth 2 (two) times daily.  Marland Kitchen telmisartan (MICARDIS) 80 MG tablet Take 1 tablet (80 mg total) by mouth daily.   No current facility-administered medications for this visit. (Other)      REVIEW OF SYSTEMS: ROS    Positive for: Musculoskeletal, Cardiovascular, Eyes   Negative for: Constitutional, Gastrointestinal, Neurological, Skin, Genitourinary, HENT, Endocrine, Respiratory, Psychiatric, Allergic/Imm, Heme/Lymph   Last edited by [name], COA on 04/05/2020  8:15 AM. (History)       ALLERGIES Allergies  Allergen Reactions  . Azithromycin Swelling  . Shrimp [Shellfish Allergy] Other (See Comments)    Gout  flares    PAST MEDICAL HISTORY Past Medical History:  Diagnosis Date  . Arthritis   . CAD (coronary artery disease)    Branch vessel and moderate RCA disease 2006  . Cancer ([place]) 1990   Melanoma Lower right Leg  . Carotid artery disease ([place])   . Cataracts, bilateral    removed by surgery  . Diabetes ([place])    type 2  . Essential hypertension   . Glaucoma   . Hyperlipidemia   . Hypothyroidism   . Lyme disease   . PAD (peripheral artery disease) ([place])    Left common iliac stent 2004  . Pneumonia    x 1  . PONV (postoperative nausea and vomiting)    blood pressure dropped  . Wears dentures   . Wears glasses    Past Surgical History:  Procedure Laterality Date  . CATARACT EXTRACTION Bilateral   . CATARACT EXTRACTION, BILATERAL    . CERVICAL DISC SURGERY     x 1  . COLONOSCOPY N/A 11/06/2016   Procedure: COLONOSCOPY;  Surgeon: Daneil Dolin, MD;  [facility];  Service: Endoscopy;  Laterality: N/A;  7:30 AM  . COLONOSCOPY  2012   Dr. Anthony Sar: normal. reviewed reports, which states he has a history of polyps in remote past.   . EYE SURGERY    . JOINT REPLACEMENT    . LUMBAR WOUND DEBRIDEMENT N/A 01/24/2019   Procedure: LUMBAR WOUND Exploration for Evacation of Seroma vs. Hematoma;  Surgeon: Jovita Gamma, MD;  Location: [facility];  Service: Neurosurgery;  Laterality: N/A;  . MELANOMA EXCISION     right leg, [facility] at [facility] and underwent immunotherapy  . NECK SURGERY      x 1 yrs ago  . REPLACEMENT TOTAL KNEE Right   . WISDOM TOOTH EXTRACTION      FAMILY HISTORY Family History  Problem Relation Age of Onset  . Heart disease Mother   . Heart attack Mother   . Colon polyps Mother   . Heart attack Maternal Grandmother   . Heart attack Maternal Grandfather   . Colon polyps Brother   . Colon cancer Neg Hx     SOCIAL HISTORY Social History   Tobacco Use  . Smoking status: Former Smoker    Packs/day: 1.50    Years: 40.00    Pack years: 60.00    Types:  Cigarettes    Start date: 11/04/1963    Quit date: 10/29/2003    Years since quitting: 16.4  . Smokeless tobacco: Never Used  Substance Use Topics  . Alcohol use: Yes    Alcohol/week: 7.0 standard drinks    Types: 7 Standard drinks or equivalent per week    Comment: one miller lite daily   . Drug use: No         OPHTHALMIC EXAM:  Base Eye  Exam    Visual Acuity (Snellen - Linear)      Right Left   Dist cc 20/20 20/70 -2   Dist ph cc  20/40 -1   Correction: Glasses       Tonometry (Tonopen, 8:12 AM)      Right Left   Pressure def 21       Pupils      Dark   Right    Left dilated       Neuro/Psych    Oriented x3: Yes   Mood/Affect: Normal       Dilation    Left eye: 1.0% Mydriacyl, 2.5% Phenylephrine @ 8:12 AM        Slit Lamp and Fundus Exam    External Exam      Right Left   External brow ptosis brow ptosis; mild periorbital edema and erythema       Slit Lamp Exam      Right Left   Lids/Lashes Dermatochalasis - upper lid, Telangiectasia, mild Meibomian gland dysfunction Dermatochalasis - upper lid, Meibomian gland dysfunction, Telangiectasia; mild erythema temporal upper lid, mild Ptosis   Conjunctiva/Sclera superior and temporal Pinguecula sutures intact, mild Subconjunctival hemorrhage   Cornea mild arcus, 1+ Punctate epithelial erosions mild arcus, well healed cataract wounds, trace 2+ Punctate epithelial erosions, 3 nylon sutures at 12 with closed cataract wound, mild surrounding Descemet's folds / edema   Anterior Chamber Deep and quiet, no cell/flare Deep, 0.5+pigment/cell   Iris No NVI, Round and dilated No NVI, moderately dilated to 5.5, Transillumination defects from 0100-0200   Lens Posterior chamber intraocular lens in good position Sutured Akreos IOL in excellent position   Vitreous vitreous syneresis post vitrectomy, clear       Fundus Exam      Right Left   Disc Pink and Sharp Pink and Sharp   C/D Ratio 0.2 0.3   Macula Flat, mild RPE  mottling, No heme or edema Flat, blunted foveal reflex, mild RPE mottling, No heme or edema   Vessels normal, mildly Tortuous Vascular attenuation, mild Tortuousity   Periphery Attached, round focal area of CR atrophy at 0630 mid zone Attached        Refraction    Wearing Rx      Sphere Cylinder Axis   Right +0.50 Sphere    Left -0.25 +0.25 037   Type: Trifocal          IMAGING AND PROCEDURES  Imaging and Procedures for @TODAY @           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 Uveitis-hyphema-glaucoma syndrome of left eye  T85.398A    H20.9    H40.42X0   3. Pigmentary glaucoma of left eye, mild stage  H40.1321   4. Retinal edema  H35.81   5. Pseudophakia of both eyes  Z96.1   6. Lyme disease  A69.20   7. Brow ptosis  H57.819   8. Dermatochalasis of both upper eyelids  H02.831    H02.834    1-3. UGH Syndrome OS  - 3 piece PCIOL OS centered, but iris has large TID in sup temp quadrant with IOL haptic within area  - d/c'd Inveltys on 10/10/18 but had flare up on 11/29/18 and was restarted  - tmax at [place] and was started on max drops (Cosopt, Brim, latan, and po diamox)  - repeat gonio showed open angles OS, mild focal PAS  - now  POD1 PPV w/ IOL exchange / sutured secondary Akreos OS IOL 04.08.21             - doing well this morning -- BCVA 20/40             - IOP mildly elevated at 21             - start   PF 4x/day OS                         zymaxid QID OS                          Cosopt BID OS   Ketorolac QID OS                         PSO ung QID OS              - eye shield when sleeping              - post op drop and positioning instructions reviewed              - tylenol/ibuprofen for pain   - f/u 1 week  4. No retinal edema on exam or OCT  5. Pseudophakia OU  - s/p CE/IOL OU -- pt can't remember dates, one eye by [facility], other eye by [facility] and now sutured IOL as above  - PCIOL OD  appears to be in good position  - monitor  6. Lyme disease  - history of diagnosis from target lesions on lower extremities  - no blood test confirmation performed  - completed doxycycline per PCP office  - no manifestation in the eyes, but will continue to monitor  7,8. Brow ptosis, dermatochalasis OU  - referred to Dr. Vickki Muff (now at [facility]) for eyelid eval  - holding on repair for now     Ophthalmic Meds Ordered this visit:  No orders of the defined types were placed in this encounter.      Return in about 1 week (around 04/12/2020) for f/u POV OS.  There are no Patient Instructions on file for this visit.   Explained the diagnoses, plan, and follow up with the patient and they expressed understanding.  Patient expressed understanding of the importance of proper follow up care.   This document serves as a record of services personally performed by Gardiner Sleeper, MD, PhD. It was created on their behalf by Ernest Mallick, OA, an ophthalmic assistant. The creation of this record is the provider's dictation and/or activities during the visit.    Electronically signed by: Ernest Mallick, OA 04.08.2021 11:30 PM   Gardiner Sleeper, M.D., Ph.D. Diseases & Surgery of the Retina and Vitreous [facility]  I have reviewed the above documentation for accuracy and completeness, and I agree with the above. Gardiner Sleeper, M.D., Ph.D. 04/07/20 11:30 PM    Abbreviations: M myopia (nearsighted); A astigmatism; H hyperopia (farsighted); P presbyopia; Mrx spectacle prescription;  CTL contact lenses; OD right eye; OS left eye; OU both eyes  XT exotropia; ET esotropia; PEK punctate epithelial keratitis; PEE punctate epithelial erosions; DES dry eye syndrome; MGD meibomian gland dysfunction; ATs artificial tears; PFAT's preservative free artificial tears; Accord nuclear sclerotic cataract; PSC posterior subcapsular cataract; ERM epi-retinal membrane; PVD posterior vitreous  detachment; RD retinal detachment; DM diabetes  mellitus; DR diabetic retinopathy; NPDR non-proliferative diabetic retinopathy; PDR proliferative diabetic retinopathy; CSME clinically significant macular edema; DME diabetic macular edema; dbh dot blot hemorrhages; CWS cotton wool spot; POAG primary open angle glaucoma; C/D cup-to-disc ratio; HVF humphrey visual field; GVF goldmann visual field; OCT optical coherence tomography; IOP intraocular pressure; BRVO Branch retinal vein occlusion; CRVO central retinal vein occlusion; CRAO central retinal artery occlusion; BRAO branch retinal artery occlusion; RT retinal tear; SB scleral buckle; PPV pars plana vitrectomy; VH Vitreous hemorrhage; PRP panretinal laser photocoagulation; IVK intravitreal kenalog; VMT vitreomacular traction; MH Macular hole;  NVD neovascularization of the disc; NVE neovascularization elsewhere; AREDS age related eye disease study; ARMD age related macular degeneration; POAG primary open angle glaucoma; EBMD epithelial/anterior basement membrane dystrophy; ACIOL anterior chamber intraocular lens; IOL intraocular lens; PCIOL posterior chamber intraocular lens; Phaco/IOL phacoemulsification with intraocular lens placement; [place] photorefractive keratectomy; LASIK laser assisted in situ keratomileusis; HTN hypertension; DM diabetes mellitus; COPD chronic obstructive pulmonary disease

## 2020-04-04 NOTE — Op Note (Signed)
Date of Surgery:4.8.2021  Pre-Op Diagnosis: 1. Dislocation of IntraocularLens Implant, Left Eye 2. UGH Syndrome, Left Eye  Post-op Diagnosis: 1. Dislocation of IntraocularLens Implant, Left Eye 2. UGH Syndrome, Left Eye  Procedure: 1.25gauge [place] Vitrectomy, left eye 2.Intraocular Lens Exchange, left eye 3. Intraocular Lens Insertion with Scleral Fixation, left eye  Surgeon:Jazzmon Prindle Coralyn Pear, MD,PhD  Assistant:Amanda Owens Shark, OA  Anesthesia: General anesthesia with endotracheal intubation  Estimated Blood Loss:<5 cc  Lens Implant: Akreos [id number] (SN [id number])  Complications: None  Description of the Procedure: Patient was seen in the Pre-operative area. After all remaining questions were answered, the operative "LeftEye" was marked and the informed consent was confirmed.The patient was brought back to the operating room by the nursing staff. The patient was succesfully placed under general anesthesia.A time-out was performed. All in attendance (the nursing staff, anesthesia staff, and ophthalmology staff) agreed upon the patient, type of surgery, and the location of surgery. The operative eye was prepped and draped in sterile fashion.  A toric IOL marker was used to mark the Akreos IOL axis. Marks were made in the superonasal and inferotemporal quadrants of the cornea.Conjunctival peritomies werethenperformed [id number] and [id number]. Hemostasis was achieved on the bare sclera usingwetfieldcautery. The paired sclerotomy sites for the IOL implant were marked using calipers -- 3 mm posterior to limbus and ~5 mm apart from each other about the corneal axis previously marked.A valved trocar was placedat 6 oclock, 56mm posterior to the limbus. The infusion line was brought to the operative field and found to be functional and free of air bubbles. The infusion line was inserted in this trocar, visualized withthelight pipe, and turned on.  The infusion was secured with steristrips. A second valved trocar was placed in the superotemporal quadrant in a beveled fashion.The inferior IOL marking in the superonasal quadrant was used to place the 3rd trocar--straight in with bevel of blade parallel to and 22mm posterior to the limbus.  Next,vitrectomy was performed under direct visualizationto clear the anterior hyaloid. A standard three-port pars plana vitrectomy was performed using the light pipe, the cutter, and the BIOM viewing system. A thoroughcore and peripheral vitreous dissection was performed. A posterior vitreous detachment was induced over the optic nerve with the assistance of kenalog staining.Next, under the wide-field viewing system, the peripheral vitreous was meticulously shaved with scleral depression.  Attention was then turned to the lens exchange. Anterior chamber paracenteses were created at3 (temporal) and 9 oclock (nasal). Iris retractors were placed to improve pupillary dilation and prevent excessive iris movement. The [facility] was filled with viscoat to protect the corneal endothelium. A cyclodialysis spatula and the light pipe were used to reposition the IOL into the anterior chamber anterior to the iris diaphragm. A 2.63microkeratome blade was used to create a triplanar wound at 12 oclock. The wound was extended to a width of ~3.25mm. IOL scissors were then used to create a radial incision to the center of the IOL opticand the one-piece IOL was removed through the corneal incision by dialing it out "Pac-Man" style.  Next,a25gauge trocarbladewas then used to create sclerotomies without cannulas atthe premarked sites 3 mm posterior to limbus and 5 mm apart from each other.The gortex suture was threaded through the eyelets of the lens and externalized through the sclera utilizing a suture hand-off technique. The lens was then folded in half and placed in the eye.The superonasal trocar was removed over the Gortex  suture andthe sutures were tied and the lens was centered. The knots were rotated into the  sclera.The main superior corneal wound was sutured closed with3x10-0 nylon sutureand the remaining paracenteses were hydrated and sealed with BSS delivered via 27g cannula.  The sclerotomies for the lens were closed with 7.0 vicryl suture.The superior temporal trocar was removed and the sclerotomy was sutured closed with 7.0 vicryl suture. The infusion trocar was removed and sutured with 7-0 vicryl. The conjunctiva was closed with 7.0 vicryl suture.Subtenon'santibioticsand Kenalog were injected. The lid speculum was removed. Antibiotic ointment and post-operative drops were placed. The eye was doubly patched shut and a shield was taped on top. The patient was transported from the operating room by the anesthesia staff and opthalmology staff having tolerated this procedure well and suffering no intraoperative complications.

## 2020-04-04 NOTE — Brief Op Note (Signed)
04/04/2020  4:42 PM  PATIENT:  Christopher Randolph  74 y.o. male  PRE-OPERATIVE DIAGNOSIS:  displaced IOL, left eye  POST-OPERATIVE DIAGNOSIS:  displaced intraocular lens, left eye  PROCEDURE:  Procedure(s): PARS PLANA VITRECTOMY WITH 25G REMOVAL/SUTURE INTRAOCULAR LENS (Left)  SURGEON:  Surgeon(s) and Role:    [name], MD - Primary  ASSISTANTS: Ernest Mallick, Ophthalmic Assistant  ANESTHESIA:   general  EBL:  10 mL   BLOOD ADMINISTERED:none  DRAINS: none   LOCAL MEDICATIONS USED:  NONE  SPECIMEN:  No Specimen  DISPOSITION OF SPECIMEN:  N/A  COUNTS:  YES  TOURNIQUET:  * No tourniquets in log *  DICTATION: .Note written in EPIC  PLAN OF CARE: Discharge to home after PACU  PATIENT DISPOSITION:  PACU - hemodynamically stable.   Delay start of Pharmacological VTE agent (>24hrs) due to surgical blood loss or risk of bleeding: not applicable

## 2020-04-04 NOTE — Transfer of Care (Signed)
Immediate Anesthesia Transfer of Care Note  Patient: Christopher Randolph  Procedure(s) Performed: PARS PLANA VITRECTOMY WITH 25G REMOVAL/SUTURE INTRAOCULAR LENS (Left Eye)  Patient Location: PACU  Anesthesia Type:General  Level of Consciousness: drowsy and patient cooperative  Airway & Oxygen Therapy: Patient Spontanous Breathing and Patient connected to face mask oxygen  Post-op Assessment: Report given to RN and Post -op Vital signs reviewed and stable  Post vital signs: Reviewed and stable  Last Vitals:  Vitals Value Taken Time  BP 132/61 04/04/20 1626  Temp    Pulse 86 04/04/20 1628  Resp 14 04/04/20 1628  SpO2 99 % 04/04/20 1628  Vitals shown include unvalidated device data.  Last Pain:  Vitals:   04/04/20 0920  TempSrc:   PainSc: 0-No pain         Complications: No apparent anesthesia complications

## 2020-04-04 NOTE — Anesthesia Postprocedure Evaluation (Signed)
Anesthesia Post Note  Patient: Christopher Randolph  Procedure(s) Performed: PARS PLANA VITRECTOMY WITH 25G REMOVAL/SUTURE INTRAOCULAR LENS (Left Eye)     Patient location during evaluation: PACU Anesthesia Type: General Level of consciousness: awake and alert and oriented Pain management: pain level controlled Vital Signs Assessment: post-procedure vital signs reviewed and stable Respiratory status: spontaneous breathing, nonlabored ventilation and respiratory function stable Cardiovascular status: blood pressure returned to baseline Postop Assessment: no apparent nausea or vomiting Anesthetic complications: no    Last Vitals:  Vitals:   04/04/20 1715 04/04/20 1730  BP: 131/65 133/61  Pulse: 78 80  Resp: 12 15  Temp:    SpO2: 95% 94%    Last Pain:  Vitals:   04/04/20 1700  TempSrc:   PainSc: (P) 1                  Brennan Bailey

## 2020-04-04 NOTE — Discharge Instructions (Addendum)
POSTOPERATIVE INSTRUCTIONS  Your doctor has performed vitreoretinal surgery on you at [facility]. [place] eye patched and shielded until seen by Dr. Coralyn Pear 8 AM tomorrow in clinic - Do not use drops until return - Keep head elevated while awake - Sleep with head elevated 30-45 degrees    - No strenuous bending, stooping or lifting.  - You may not drive until further notice.  - Tylenol or any other over-the-counter pain reliever can be used according to your doctor. If more pain medicine is required, your doctor will have a prescription for you.  - You may read, go up and down stairs, and watch television.     [name], M.D., Ph.D.

## 2020-04-04 NOTE — Interval H&P Note (Signed)
History and Physical Interval Note:  04/04/2020 9:23 AM  Christopher Randolph  has presented today for surgery, with the diagnosis of displaced IOL, left [id number].  The various methods of treatment have been discussed with the patient and family. After consideration of risks, benefits and other options for treatment, the patient has consented to  Procedure(s): PARS PLANA VITRECTOMY WITH 25G REMOVAL/SUTURE INTRAOCULAR LENS (Left) as a surgical intervention.  The patient's history has been reviewed, patient examined, no change in status, stable for surgery.  I have reviewed the patient's chart and labs.  Questions were answered to the patient's satisfaction.     [name]

## 2020-04-04 NOTE — Anesthesia Procedure Notes (Signed)
Procedure Name: Intubation Date/Time: 04/04/2020 12:00 PM Performed by: Kathryne Hitch, CRNA Pre-anesthesia Checklist: Patient identified, Emergency Drugs available, Suction available and Patient being monitored Patient Re-evaluated:Patient Re-evaluated prior to induction Oxygen Delivery Method: Circle system utilized Preoxygenation: Pre-oxygenation with 100% oxygen Induction Type: IV induction Ventilation: Mask ventilation without difficulty Laryngoscope Size: Miller and 3 Grade View: Grade I Tube type: Oral Tube size: 7.5 mm Number of attempts: 1 Airway Equipment and Method: Stylet and Oral airway Placement Confirmation: ETT inserted through vocal cords under direct vision,  positive ETCO2 and breath sounds checked- equal and bilateral Secured at: 23 cm Tube secured with: Tape Dental Injury: Teeth and Oropharynx as per pre-operative assessment

## 2020-04-05 ENCOUNTER — Ambulatory Visit (INDEPENDENT_AMBULATORY_CARE_PROVIDER_SITE_OTHER): Payer: Medicare Other | Admitting: Ophthalmology

## 2020-04-05 ENCOUNTER — Encounter (INDEPENDENT_AMBULATORY_CARE_PROVIDER_SITE_OTHER): Payer: Self-pay | Admitting: Ophthalmology

## 2020-04-05 DIAGNOSIS — Z961 Presence of intraocular lens: Secondary | ICD-10-CM

## 2020-04-05 DIAGNOSIS — H209 Unspecified iridocyclitis: Secondary | ICD-10-CM

## 2020-04-05 DIAGNOSIS — H401321 Pigmentary glaucoma, left eye, mild stage: Secondary | ICD-10-CM

## 2020-04-05 DIAGNOSIS — H3581 Retinal edema: Secondary | ICD-10-CM

## 2020-04-05 DIAGNOSIS — H02834 Dermatochalasis of left upper eyelid: Secondary | ICD-10-CM

## 2020-04-05 DIAGNOSIS — H4042X0 Glaucoma secondary to eye inflammation, left eye, stage unspecified: Secondary | ICD-10-CM

## 2020-04-05 DIAGNOSIS — A6920 Lyme disease, unspecified: Secondary | ICD-10-CM

## 2020-04-05 DIAGNOSIS — H57819 Brow ptosis, unspecified: Secondary | ICD-10-CM

## 2020-04-05 DIAGNOSIS — H02831 Dermatochalasis of right upper eyelid: Secondary | ICD-10-CM

## 2020-04-05 DIAGNOSIS — T85398A Other mechanical complication of other ocular prosthetic devices, implants and grafts, initial encounter: Secondary | ICD-10-CM

## 2020-04-07 ENCOUNTER — Encounter (INDEPENDENT_AMBULATORY_CARE_PROVIDER_SITE_OTHER): Payer: Self-pay | Admitting: Ophthalmology

## 2020-04-08 ENCOUNTER — Encounter: Payer: Self-pay | Admitting: *Deleted

## 2020-04-08 NOTE — Progress Notes (Addendum)
[facility] Note  04/12/2020     CHIEF COMPLAINT Patient presents for Post-op Follow-up   HISTORY OF PRESENT ILLNESS: Christopher Randolph is a 74 y.o. male who presents to the clinic today for:   HPI    Post-op Follow-up    In left eye.  Discomfort includes foreign body sensation.  Negative for pain, itching, tearing, discharge, floaters and none.  Vision is stable.  I, the attending physician,  performed the HPI with the patient and updated documentation appropriately.          Comments    Pt states it is difficult to tell how his vision is doing, he states he is finally able to fully open his eye, he has been having problems with his blood pressure, he states is had been running high (200/90), his PCP put him on amlodipine which made it drop very low, very quickly, he had the dose adjusted and took the first new pill today       Last edited by [name], MD on 04/12/2020  1:31 PM. (History)    Patient states vision is the same, he is trying to get his blood pressure under control right now, he is using drops as directed, but says Cosopt burns  Referring physician: Curlene Labrum, MD [place],  [place] [id number]  HISTORICAL INFORMATION:   Selected notes from the [facility] Referred by Dr. [name] for concern of vitreous hemorrhage LEE: 05.21.19 [name]) [BCVA: OD: OS: ] Ocular Hx-Psedophakia OU,  PMH-DM (on Metformin), Arthritis     CURRENT MEDICATIONS: Current Outpatient Medications (Ophthalmic Drugs)  Medication Sig  . bacitracin-polymyxin b (POLYSPORIN) ophthalmic ointment Place into the left eye at bedtime. Place a 1/2 inch ribbon of ointment into the lower eyelid at bedtime and as needed  . brimonidine (ALPHAGAN) 0.2 % ophthalmic solution Place 1 drop into the left eye 3 (three) times daily.  . dorzolamide-timolol (COSOPT) 22.3-6.8 MG/ML ophthalmic solution Place 1 drop into the left eye 2 (two) times daily.  Marland Kitchen  ketorolac (ACULAR) 0.5 % ophthalmic solution Place 1 drop into the left eye 3 (three) times daily.  . Loteprednol Etabonate (INVELTYS) 1 % SUSP Apply 1 drop to eye 2 (two) times a day. 1 drop 2 times daily left eye (Patient taking differently: Place 1 drop into the left eye in the morning and at bedtime. )  . Netarsudil-Latanoprost (ROCKLATAN) 0.02-0.005 % SOLN Place 1 drop into the left eye at bedtime.  . prednisoLONE acetate (PRED FORTE) 1 % ophthalmic suspension Place 1 drop into the left eye 3 (three) times daily.   No current facility-administered medications for this visit. (Ophthalmic Drugs)   Current Outpatient Medications (Other)  Medication Sig  . acetaZOLAMIDE (DIAMOX) 250 MG tablet Take 250 mg by mouth 3 (three) times daily.  Marland Kitchen aspirin EC 81 MG tablet Take 81 mg by mouth daily.    Marland Kitchen atorvastatin (LIPITOR) 20 MG tablet TAKE 1 TABLET BY MOUTH DAILY (Patient taking differently: Take 20 mg by mouth daily. )  . fluticasone (FLONASE ALLERGY RELIEF) 50 MCG/ACT nasal spray Place 2 sprays into both nostrils daily.   Marland Kitchen levothyroxine (SYNTHROID, LEVOTHROID) 150 MCG tablet Take 150 mcg by mouth See admin instructions. Alternates taking 150 mg daily for 2 days then 175 mcg the third day then repeat  . levothyroxine (SYNTHROID, LEVOTHROID) 175 MCG tablet Take 175 mcg by mouth See admin instructions. Alternates taking 150 mg daily for  2 days then 175 mcg the third day then repeat  . metFORMIN (GLUCOPHAGE) 500 MG tablet Take 500 mg by mouth daily.   . metoprolol tartrate (LOPRESSOR) 25 MG tablet Take 25 mg by mouth 2 (two) times daily.   . mometasone (ELOCON) 0.1 % ointment Apply 1 application topically daily.   . naproxen sodium (ANAPROX) 220 MG tablet Take 220 mg by mouth daily as needed (for pain.).   Marland Kitchen nitroGLYCERIN (NITROSTAT) 0.4 MG SL tablet DISSOLVE 1 TABLET UNDER THE TONGUE EVERY 5 MINUTES AS NEEDED FOR CHEST PAIN. DO NOT EXCEED A TOTAL OF 3 DOSES IN 15 MINUTES.  . tamsulosin (FLOMAX) 0.4 MG  CAPS capsule Take 0.4 mg by mouth 2 (two) times daily.  Marland Kitchen telmisartan (MICARDIS) 80 MG tablet Take 1 tablet (80 mg total) by mouth daily.   No current facility-administered medications for this visit. (Other)      REVIEW OF SYSTEMS: ROS    Positive for: Eyes   Negative for: Constitutional, Gastrointestinal, Neurological, Skin, Genitourinary, Musculoskeletal, HENT, Endocrine, Cardiovascular, Respiratory, Psychiatric, Allergic/Imm, Heme/Lymph   Last edited by Debbrah Alar, COT on 04/12/2020  1:15 PM. (History)       ALLERGIES Allergies  Allergen Reactions  . Azithromycin Swelling  . Shrimp [Shellfish Allergy] Other (See Comments)    Gout flares    PAST MEDICAL HISTORY Past Medical History:  Diagnosis Date  . Arthritis   . CAD (coronary artery disease)    Branch vessel and moderate RCA disease 2006  . Cancer ([place]) 1990   Melanoma Lower right Leg  . Carotid artery disease ([place])   . Cataracts, bilateral    removed by surgery  . Diabetes ([place])    type 2  . Essential hypertension   . Glaucoma   . Hyperlipidemia   . Hypothyroidism   . Lyme disease   . PAD (peripheral artery disease) ([place])    Left common iliac stent 2004  . Pneumonia    x 1  . PONV (postoperative nausea and vomiting)    blood pressure dropped  . Wears dentures   . Wears glasses    Past Surgical History:  Procedure Laterality Date  . CATARACT EXTRACTION Bilateral   . CATARACT EXTRACTION, BILATERAL    . CERVICAL DISC SURGERY     x 1  . COLONOSCOPY N/A 11/06/2016   Procedure: COLONOSCOPY;  Surgeon: Daneil Dolin, MD;  [facility];  Service: Endoscopy;  Laterality: N/A;  7:30 AM  . COLONOSCOPY  2012   Dr. Anthony Sar: normal. reviewed reports, which states he has a history of polyps in remote past.   . EYE SURGERY    . JOINT REPLACEMENT    . LUMBAR WOUND DEBRIDEMENT N/A 01/24/2019   Procedure: LUMBAR WOUND Exploration for Evacation of Seroma vs. Hematoma;  Surgeon: Jovita Gamma, MD;   Location: [facility];  Service: Neurosurgery;  Laterality: N/A;  . MELANOMA EXCISION     right leg, seen at [facility] and underwent immunotherapy  . NECK SURGERY      x 1 yrs ago  . PARS PLANA VITRECTOMY Left 04/04/2020   Procedure: PARS PLANA VITRECTOMY WITH 25G REMOVAL/SUTURE INTRAOCULAR LENS;  Surgeon: [name], MD;  Location: [facility];  Service: Ophthalmology;  Laterality: Left;  . REPLACEMENT TOTAL KNEE Right   . WISDOM TOOTH EXTRACTION      FAMILY HISTORY Family History  Problem Relation Age of Onset  . Heart disease Mother   . Heart attack Mother   . Colon  polyps Mother   . Heart attack Maternal Grandmother   . Heart attack Maternal Grandfather   . Colon polyps Brother   . Colon cancer Neg Hx     SOCIAL HISTORY Social History   Tobacco Use  . Smoking status: Former Smoker    Packs/day: 1.50    Years: 40.00    Pack years: 60.00    Types: Cigarettes    Start date: 11/04/1963    Quit date: 10/29/2003    Years since quitting: 16.4  . Smokeless tobacco: Never Used  Substance Use Topics  . Alcohol use: Yes    Alcohol/week: 7.0 standard drinks    Types: 7 Standard drinks or equivalent per week    Comment: one miller lite daily   . Drug use: No         OPHTHALMIC EXAM:  Base Eye Exam    Visual Acuity (Snellen - Linear)      Right Left   Dist cc 20/20 20/70   Dist ph cc  20/40   Correction: Glasses       Tonometry (Tonopen, 1:03 PM)      Right Left   Pressure 20 20       Pupils      Dark Light Shape React APD   Right 3 2 Round Slow None   Left 4 4 Round NR None       Visual Fields (Counting fingers)      Left Right    Full Full       Extraocular Movement      Right Left    Full, Ortho Full, Ortho       Neuro/Psych    Oriented x3: Yes   Mood/Affect: Normal       Dilation    Both eyes: 1.0% Mydriacyl, 2.5% Phenylephrine @ 1:04 PM        Slit Lamp and Fundus Exam    External Exam      Right Left   External brow ptosis brow ptosis; mild  periorbital edema and erythema       Slit Lamp Exam      Right Left   Lids/Lashes Dermatochalasis - upper lid, Telangiectasia, mild Meibomian gland dysfunction Dermatochalasis - upper lid, Meibomian gland dysfunction, Telangiectasia; mild erythema temporal upper lid, mild Ptosis   Conjunctiva/Sclera superior and temporal Pinguecula sutures intact, mild Subconjunctival hemorrhage -- improving   Cornea mild arcus, 1+ Punctate epithelial erosions mild arcus, 1+ Punctate epithelial erosions, 3 nylon sutures at 12 with mild surrounding Descemet's folds / edema, +ointment   Anterior Chamber Deep and quiet, no cell/flare Deep, 0.5+pigment/cell   Iris No NVI, Round and dilated No NVI, moderately dilated to 5.5, Transillumination defects from 0100-0200   Lens Posterior chamber intraocular lens in good position Sutured Akreos IOL in excellent position   Vitreous vitreous syneresis post vitrectomy, clear       Fundus Exam      Right Left   Disc Pink and Sharp Pink and Sharp   C/D Ratio 0.2 0.3   Macula Flat, mild RPE mottling, No heme or edema Flat, blunted foveal reflex, mild RPE mottling, No heme or edema   Vessels normal, mildly Tortuous Vascular attenuation, mild Tortuousity   Periphery Attached, round focal area of CR atrophy at 0630 mid zone Attached          IMAGING AND PROCEDURES  Imaging and Procedures for @TODAY @  OCT, Retina - OU - Both Eyes       Right  Eye Quality was good. Central Foveal Thickness: 267. Progression has been stable. Findings include normal foveal contour, no SRF, no IRF (partial PVD - stable).   Left Eye Quality was borderline. Central Foveal Thickness: 271. Progression has been stable. Findings include normal foveal contour, no IRF, no SRF.   Notes *Images captured and stored on drive  Diagnosis / Impression:  NFP, no IRF, no SRF OU Partial PVD OD stable  Clinical management:  See below  Abbreviations: NFP - Normal foveal profile. CME - cystoid  macular edema. PED - pigment epithelial detachment. IRF - intraretinal fluid. SRF - subretinal fluid. EZ - ellipsoid zone. ERM - epiretinal membrane. ORA - outer retinal atrophy. ORT - outer retinal tubulation. 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 Uveitis-hyphema-glaucoma syndrome of left eye  T85.398A    H20.9    H40.42X0   3. Pigmentary glaucoma of left eye, mild stage  H40.1321   4. Dislocated IOL (intraocular lens), anterior, left  H27.122   5. Retinal edema  H35.81 OCT, Retina - OU - Both Eyes  6. Pseudophakia of both eyes  Z96.1   7. Lyme disease  A69.20   8. Brow ptosis  H57.819   9. Dermatochalasis of both upper eyelids  H02.831    H02.834    1-4. UGH Syndrome OS  - 3 piece PCIOL OS centered, but iris has large TID in sup temp quadrant with IOL haptic within area  - d/c'd [facility] on 10/10/18 but had flare up on 11/29/18 and was restarted  - tmax at [facility] and was started on max drops (Cosopt, Brim, latan, and po diamox)  - repeat gonio showed open angles OS, mild focal PAS  - now POW1 PPV w/ IOL exchange / sutured secondary Akreos OS IOL 04.08.21             - doing well  -- BCVA 20/40             - IOP okay at 20             - cont   PF 4x/day OS -- decrease to TID                         zymaxid QID OS -- stop when bottle runs out                         Cosopt BID OS   Ketorolac QID OS -- decrease to TID                         PSO ung QID, prn OS              - eye shield when sleeping  x1 more wk             - post op drop and positioning instructions reviewed              - tylenol/ibuprofen for pain   - f/u 2-3 weeks -- POV and MRx, auto-refraction  5. No retinal edema on exam or OCT  6. Pseudophakia OU  - s/p CE/IOL OU -- pt can't remember dates, one eye by [facility], other eye by [facility]  and now sutured IOL as above  - PCIOL OD  appears to be in good position  - monitor  7. Lyme disease  - history of diagnosis from target lesions on lower extremities  - no blood test confirmation performed  - completed doxycycline per PCP office  - no manifestation in the eyes, but will continue to monitor  8,9. Brow ptosis, dermatochalasis OU  - referred to Dr. Vickki Muff (now at [facility]) for eyelid eval  - holding on repair for now     Ophthalmic Meds Ordered this visit:  Meds ordered this encounter  Medications  . prednisoLONE acetate (PRED FORTE) 1 % ophthalmic suspension    Sig: Place 1 drop into the left eye 3 (three) times daily.    Dispense:  15 mL    Refill:  0  . ketorolac (ACULAR) 0.5 % ophthalmic solution    Sig: Place 1 drop into the left eye 3 (three) times daily.    Dispense:  10 mL    Refill:  0  . bacitracin-polymyxin b (POLYSPORIN) ophthalmic ointment    Sig: Place into the left eye at bedtime. Place a 1/2 inch ribbon of ointment into the lower eyelid at bedtime and as needed    Dispense:  3.5 g    Refill:  0       Return for f/u 2-3 weeks s/p sutured IOL OS, DFE, OCT.  There are no Patient Instructions on file for this visit.   Explained the diagnoses, plan, and follow up with the patient and they expressed understanding.  Patient expressed understanding of the importance of proper follow up care.   This document serves as a record of services personally performed by Gardiner Sleeper, MD, PhD. It was created on their behalf by Ernest Mallick, OA, an ophthalmic assistant. The creation of this record is the provider's dictation and/or activities during the visit.    Electronically signed by: Ernest Mallick, OA 04.12.2021 1:31 PM   Gardiner Sleeper, M.D., Ph.D. Diseases & Surgery of the Retina and Vitreous [facility]  I have reviewed the above documentation for accuracy and completeness, and I agree with the above. Gardiner Sleeper, M.D., Ph.D. 04/12/20 1:31  PM   Abbreviations: M myopia (nearsighted); A astigmatism; H hyperopia (farsighted); P presbyopia; Mrx spectacle prescription;  CTL contact lenses; OD right eye; OS left eye; OU both eyes  XT exotropia; ET esotropia; PEK punctate epithelial keratitis; PEE punctate epithelial erosions; DES dry eye syndrome; MGD meibomian gland dysfunction; ATs artificial tears; PFAT's preservative free artificial tears; [place] nuclear sclerotic cataract; PSC posterior subcapsular cataract; ERM epi-retinal membrane; PVD posterior vitreous detachment; RD retinal detachment; DM diabetes mellitus; DR diabetic retinopathy; NPDR non-proliferative diabetic retinopathy; PDR proliferative diabetic retinopathy; CSME clinically significant macular edema; DME diabetic macular edema; dbh dot blot hemorrhages; CWS cotton wool spot; POAG primary open angle glaucoma; C/D cup-to-disc ratio; HVF humphrey visual field; GVF goldmann visual field; OCT optical coherence tomography; IOP intraocular pressure; BRVO Branch retinal vein occlusion; CRVO central retinal vein occlusion; CRAO central retinal artery occlusion; BRAO branch retinal artery occlusion; RT retinal tear; SB scleral buckle; PPV pars plana vitrectomy; VH Vitreous hemorrhage; PRP panretinal laser photocoagulation; IVK intravitreal kenalog; VMT vitreomacular traction; MH Macular hole;  NVD neovascularization of the disc; NVE neovascularization elsewhere; AREDS age related eye disease study; ARMD age related macular degeneration; POAG primary open angle glaucoma; EBMD epithelial/anterior basement membrane dystrophy; ACIOL anterior chamber intraocular lens; IOL intraocular lens; PCIOL posterior chamber  intraocular lens; Phaco/IOL phacoemulsification with intraocular lens placement; [place] photorefractive keratectomy; LASIK laser assisted in situ keratomileusis; HTN hypertension; DM diabetes mellitus; COPD chronic obstructive pulmonary disease

## 2020-04-09 DIAGNOSIS — Z20828 Contact with and (suspected) exposure to other viral communicable diseases: Secondary | ICD-10-CM | POA: Diagnosis not present

## 2020-04-09 DIAGNOSIS — I10 Essential (primary) hypertension: Secondary | ICD-10-CM | POA: Diagnosis not present

## 2020-04-09 DIAGNOSIS — E1151 Type 2 diabetes mellitus with diabetic peripheral angiopathy without gangrene: Secondary | ICD-10-CM | POA: Diagnosis not present

## 2020-04-09 DIAGNOSIS — Z6827 Body mass index (BMI) 27.0-27.9, adult: Secondary | ICD-10-CM | POA: Diagnosis not present

## 2020-04-12 ENCOUNTER — Ambulatory Visit (INDEPENDENT_AMBULATORY_CARE_PROVIDER_SITE_OTHER): Payer: Medicare Other | Admitting: Ophthalmology

## 2020-04-12 ENCOUNTER — Encounter (INDEPENDENT_AMBULATORY_CARE_PROVIDER_SITE_OTHER): Payer: Self-pay | Admitting: Ophthalmology

## 2020-04-12 DIAGNOSIS — H209 Unspecified iridocyclitis: Secondary | ICD-10-CM

## 2020-04-12 DIAGNOSIS — H401321 Pigmentary glaucoma, left eye, mild stage: Secondary | ICD-10-CM | POA: Diagnosis not present

## 2020-04-12 DIAGNOSIS — H3581 Retinal edema: Secondary | ICD-10-CM | POA: Diagnosis not present

## 2020-04-12 DIAGNOSIS — H4042X0 Glaucoma secondary to eye inflammation, left eye, stage unspecified: Secondary | ICD-10-CM | POA: Diagnosis not present

## 2020-04-12 DIAGNOSIS — H02831 Dermatochalasis of right upper eyelid: Secondary | ICD-10-CM

## 2020-04-12 DIAGNOSIS — A6920 Lyme disease, unspecified: Secondary | ICD-10-CM

## 2020-04-12 DIAGNOSIS — Z961 Presence of intraocular lens: Secondary | ICD-10-CM

## 2020-04-12 DIAGNOSIS — H57819 Brow ptosis, unspecified: Secondary | ICD-10-CM

## 2020-04-12 DIAGNOSIS — H27122 Anterior dislocation of lens, left eye: Secondary | ICD-10-CM | POA: Diagnosis not present

## 2020-04-12 DIAGNOSIS — T85398A Other mechanical complication of other ocular prosthetic devices, implants and grafts, initial encounter: Secondary | ICD-10-CM

## 2020-04-12 DIAGNOSIS — H02834 Dermatochalasis of left upper eyelid: Secondary | ICD-10-CM

## 2020-04-12 MED ORDER — BACITRACIN-POLYMYXIN B 500-10000 UNIT/GM OP OINT: TOPICAL_OINTMENT | Freq: Every day | OPHTHALMIC | 0 refills | Status: DC

## 2020-04-12 MED ORDER — PREDNISOLONE ACETATE 1 % OP SUSP: 1.0000 [drp] | Freq: Three times a day (TID) | OPHTHALMIC | 0 refills | Status: DC

## 2020-04-12 MED ORDER — KETOROLAC TROMETHAMINE 0.5 % OP SOLN: 1.0000 [drp] | Freq: Three times a day (TID) | OPHTHALMIC | 0 refills | Status: DC

## 2020-04-17 DIAGNOSIS — Z6827 Body mass index (BMI) 27.0-27.9, adult: Secondary | ICD-10-CM | POA: Diagnosis not present

## 2020-04-17 DIAGNOSIS — E1151 Type 2 diabetes mellitus with diabetic peripheral angiopathy without gangrene: Secondary | ICD-10-CM | POA: Diagnosis not present

## 2020-04-17 DIAGNOSIS — Z1331 Encounter for screening for depression: Secondary | ICD-10-CM | POA: Diagnosis not present

## 2020-04-17 DIAGNOSIS — Z20828 Contact with and (suspected) exposure to other viral communicable diseases: Secondary | ICD-10-CM | POA: Diagnosis not present

## 2020-04-17 DIAGNOSIS — Z1389 Encounter for screening for other disorder: Secondary | ICD-10-CM | POA: Diagnosis not present

## 2020-04-17 DIAGNOSIS — I10 Essential (primary) hypertension: Secondary | ICD-10-CM | POA: Diagnosis not present

## 2020-04-24 NOTE — Progress Notes (Signed)
[facility] Note  04/29/2020     CHIEF COMPLAINT Patient presents for Retina Follow Up   HISTORY OF PRESENT ILLNESS: Christopher Randolph is a 74 y.o. male who presents to the clinic today for:   HPI    Retina Follow Up    Patient presents with  Other.  In left eye.  This started weeks ago.  Severity is moderate.  Duration of weeks.  Since onset it is stable.  I, the attending physician,  performed the HPI with the patient and updated documentation appropriately.          Comments    Pt states his vision is about the same OU--patient states when he gets up in AM, vision is blurry in left eye but gets better throughout the day.  Patient denies eye pain but has some discomfort from itching OU.  Patient denies any new or worsening floaters or fol OU.       Last edited by [name], MD on 04/29/2020  9:02 AM. (History)    Patient feels like his vision is getting better, he states when he wakes up in the mornings everything is very cloudy, he states he has been using the ointment "as little as possible", he is also using Cosopt BID, PF/Ketorolac TID   Referring physician: Curlene Labrum, MD [place],  [place] [address]  HISTORICAL INFORMATION:   Selected notes from the MEDICAL RECORD NUMBER Referred by Dr. [name] for concern of vitreous hemorrhage LEE: 05.21.19 [name]) [BCVA: OD: OS: ] Ocular Hx-Psedophakia OU,  PMH-DM (on Metformin), Arthritis     CURRENT MEDICATIONS: Current Outpatient Medications (Ophthalmic Drugs)  Medication Sig  . bacitracin-polymyxin b (POLYSPORIN) ophthalmic ointment Place into the left eye at bedtime. Place a 1/2 inch ribbon of ointment into the lower eyelid at bedtime and as needed  . brimonidine (ALPHAGAN) 0.2 % ophthalmic solution Place 1 drop into the left eye 3 (three) times daily.  . dorzolamide-timolol (COSOPT) 22.3-6.8 MG/ML ophthalmic solution Place 1 drop into the left eye 2 (two) times daily.  Marland Kitchen  ketorolac (ACULAR) 0.5 % ophthalmic solution Place 1 drop into the left eye 3 (three) times daily.  . Loteprednol Etabonate (INVELTYS) 1 % SUSP Apply 1 drop to eye 2 (two) times a day. 1 drop 2 times daily left eye (Patient taking differently: Place 1 drop into the left eye in the morning and at bedtime. )  . Netarsudil-Latanoprost (ROCKLATAN) 0.02-0.005 % SOLN Place 1 drop into the left eye at bedtime.  . prednisoLONE acetate (PRED FORTE) 1 % ophthalmic suspension Place 1 drop into the left eye 3 (three) times daily.   No current facility-administered medications for this visit. (Ophthalmic Drugs)   Current Outpatient Medications (Other)  Medication Sig  . acetaZOLAMIDE (DIAMOX) 250 MG tablet Take 250 mg by mouth 3 (three) times daily.  Marland Kitchen aspirin EC 81 MG tablet Take 81 mg by mouth daily.    Marland Kitchen atorvastatin (LIPITOR) 20 MG tablet TAKE 1 TABLET BY MOUTH DAILY (Patient taking differently: Take 20 mg by mouth daily. )  . fluticasone (FLONASE ALLERGY RELIEF) 50 MCG/ACT nasal spray Place 2 sprays into both nostrils daily.   Marland Kitchen levothyroxine (SYNTHROID, LEVOTHROID) 150 MCG tablet Take 150 mcg by mouth See admin instructions. Alternates taking 150 mg daily for 2 days then 175 mcg the third day then repeat  . levothyroxine (SYNTHROID, LEVOTHROID) 175 MCG tablet Take 175 mcg by mouth See admin instructions.  Alternates taking 150 mg daily for 2 days then 175 mcg the third day then repeat  . metFORMIN (GLUCOPHAGE) 500 MG tablet Take 500 mg by mouth daily.   . metoprolol tartrate (LOPRESSOR) 25 MG tablet Take 25 mg by mouth 2 (two) times daily.   . mometasone (ELOCON) 0.1 % ointment Apply 1 application topically daily.   . naproxen sodium (ANAPROX) 220 MG tablet Take 220 mg by mouth daily as needed (for pain.).   Marland Kitchen nitroGLYCERIN (NITROSTAT) 0.4 MG SL tablet DISSOLVE 1 TABLET UNDER THE TONGUE EVERY 5 MINUTES AS NEEDED FOR CHEST PAIN. DO NOT EXCEED A TOTAL OF 3 DOSES IN 15 MINUTES.  . tamsulosin (FLOMAX) 0.4 MG  CAPS capsule Take 0.4 mg by mouth 2 (two) times daily.  Marland Kitchen telmisartan (MICARDIS) 80 MG tablet Take 1 tablet (80 mg total) by mouth daily.   No current facility-administered medications for this visit. (Other)      REVIEW OF SYSTEMS: ROS    Positive for: Eyes   Negative for: Constitutional, Gastrointestinal, Neurological, Skin, Genitourinary, Musculoskeletal, HENT, Endocrine, Cardiovascular, Respiratory, Psychiatric, Allergic/Imm, Heme/Lymph   Last edited by Doneen Poisson on 04/29/2020  8:00 AM. (History)       ALLERGIES Allergies  Allergen Reactions  . Azithromycin Swelling  . Shrimp [Shellfish Allergy] Other (See Comments)    Gout flares    PAST MEDICAL HISTORY Past Medical History:  Diagnosis Date  . Arthritis   . CAD (coronary artery disease)    Branch vessel and moderate RCA disease 2006  . Cancer ([place]) 1990   Melanoma Lower right Leg  . Carotid artery disease ([place])   . Cataracts, bilateral    removed by surgery  . Diabetes ([place])    type 2  . Essential hypertension   . Glaucoma   . Hyperlipidemia   . Hypothyroidism   . Lyme disease   . PAD (peripheral artery disease) ([place])    Left common iliac stent 2004  . Pneumonia    x 1  . PONV (postoperative nausea and vomiting)    blood pressure dropped  . Wears dentures   . Wears glasses    Past Surgical History:  Procedure Laterality Date  . CATARACT EXTRACTION Bilateral   . CATARACT EXTRACTION, BILATERAL    . CERVICAL DISC SURGERY     x 1  . COLONOSCOPY N/A 11/06/2016   Procedure: COLONOSCOPY;  Surgeon: Daneil Dolin, MD;  [facility];  Service: Endoscopy;  Laterality: N/A;  7:30 AM  . COLONOSCOPY  2012   Dr. Anthony Sar: normal. reviewed reports, which states he has a history of polyps in remote past.   . EYE SURGERY    . JOINT REPLACEMENT    . LUMBAR WOUND DEBRIDEMENT N/A 01/24/2019   Procedure: LUMBAR WOUND Exploration for Evacation of Seroma vs. Hematoma;  Surgeon: Jovita Gamma, MD;   Location: [place];  Service: Neurosurgery;  Laterality: N/A;  . MELANOMA EXCISION     right leg, seen at [facility] and underwent immunotherapy  . NECK SURGERY      x 1 yrs ago  . PARS PLANA VITRECTOMY Left 04/04/2020   Procedure: PARS PLANA VITRECTOMY WITH 25G REMOVAL/SUTURE INTRAOCULAR LENS;  Surgeon: [name], MD;  Location: [place];  Service: Ophthalmology;  Laterality: Left;  . REPLACEMENT TOTAL KNEE Right   . WISDOM TOOTH EXTRACTION      FAMILY HISTORY Family History  Problem Relation Age of Onset  . Heart disease Mother   . Heart attack  Mother   . Colon polyps Mother   . Heart attack Maternal Grandmother   . Heart attack Maternal Grandfather   . Colon polyps Brother   . Colon cancer Neg Hx     SOCIAL HISTORY Social History   Tobacco Use  . Smoking status: Former Smoker    Packs/day: 1.50    Years: 40.00    Pack years: 60.00    Types: Cigarettes    Start date: 11/04/1963    Quit date: 10/29/2003    Years since quitting: 16.5  . Smokeless tobacco: Never Used  Substance Use Topics  . Alcohol use: Yes    Alcohol/week: 7.0 standard drinks    Types: 7 Standard drinks or equivalent per week    Comment: one miller lite daily   . Drug use: No         OPHTHALMIC EXAM:  Base Eye Exam    Visual Acuity (Snellen - Linear)      Right Left   Dist cc 20/20 -1 20/100 -1   Dist ph cc  20/50 -2   Correction: Glasses       Tonometry (Tonopen, 8:04 AM)      Right Left   Pressure 13 33  1 gtt brimonidine OS @ 8:17 1 gtt Cosopt OS @ 8:18       Pupils      Dark Light Shape React APD   Right 3 2 Round Brisk 0   Left 3 2 Round Minimal 0       Visual Fields      Left Right    Full Full       Extraocular Movement      Right Left    Full Full       Neuro/Psych    Oriented x3: Yes   Mood/Affect: Normal        Slit Lamp and Fundus Exam    External Exam      Right Left   External brow ptosis brow ptosis; mild periorbital edema and erythema       Slit Lamp  Exam      Right Left   Lids/Lashes Dermatochalasis - upper lid, Telangiectasia, mild Meibomian gland dysfunction Dermatochalasis - upper lid, Meibomian gland dysfunction, Telangiectasia; mild erythema temporal upper lid, mild Ptosis   Conjunctiva/Sclera superior and temporal Pinguecula White and quiet, sutures intact   Cornea mild arcus, 1+ Punctate epithelial erosions mild arcus, trace Punctate epithelial erosions, 3 nylon sutures at 12 with mild surrounding edema   Anterior Chamber Deep and quiet, no cell/flare Deep, 0.5+pigment/cell   Iris No NVI, Round and dilated No NVI, moderately dilated to 5.5, Transillumination defects from 0100-0200   Lens Posterior chamber intraocular lens in good position Sutured Akreos sutured IOL in excellent position   Vitreous vitreous syneresis post vitrectomy, clear       Fundus Exam      Right Left   Disc Pink and Sharp Pink and Sharp   C/D Ratio 0.2 0.3   Macula Flat, mild RPE mottling, No heme or edema Flat, blunted foveal reflex, mild RPE mottling, No heme or edema   Vessels normal, mildly Tortuous Vascular attenuation, mild Tortuousity   Periphery Attached, round focal area of CR atrophy at 0630 mid zone Attached        Refraction    Wearing Rx      Sphere Cylinder Axis   Right +0.50 Sphere    Left -0.25 +0.25 037   Type: Trifocal  Manifest Refraction      Sphere Cylinder Axis Dist VA   Right +0.25 +0.25 180 20/20-1   Left -2.50 +0.50 180 20/25-1          IMAGING AND PROCEDURES  Imaging and Procedures for @TODAY @  OCT, Retina - OU - Both Eyes       Right Eye Quality was good. Central Foveal Thickness: 267. Progression has been stable. Findings include normal foveal contour, no SRF, no IRF (partial PVD - stable).   Left Eye Quality was good. Central Foveal Thickness: 280. Progression has been stable. Findings include normal foveal contour, no IRF, no SRF.   Notes *Images captured and stored on drive  Diagnosis /  Impression:  NFP, no IRF, no SRF OU Partial PVD OD stable  Clinical management:  See below  Abbreviations: NFP - Normal foveal profile. CME - cystoid macular edema. PED - pigment epithelial detachment. IRF - intraretinal fluid. SRF - subretinal fluid. EZ - ellipsoid zone. ERM - epiretinal membrane. ORA - outer retinal atrophy. ORT - outer retinal tubulation. 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 Retinal edema  H35.81 OCT, Retina - OU - Both Eyes  3. Uveitis-hyphema-glaucoma syndrome of left eye  T85.398A    H20.9    H40.42X0   4. Pigmentary glaucoma of left eye, mild stage  H40.1321   5. Pseudophakia of both eyes  Z96.1   6. Dermatochalasis of both upper eyelids  H02.831    H02.834   7. Brow ptosis  H57.819   8. Lyme disease  A69.20   9. Dislocated IOL (intraocular lens), anterior, left  H27.122    1-4. UGH Syndrome OS  - 3 piece PCIOL OS centered, but iris has large TID in sup temp quadrant with IOL haptic within area  - d/c'd Inveltys on 10/10/18 but had flare up on 11/29/18 and was restarted  - tmax at [facility] and was started on max drops (Cosopt, Brim, latan, and po diamox)  - repeat gonio showed open angles OS, mild focal PAS  - now POW4 PPV w/ IOL exchange / sutured secondary Akreos OS IOL 04.08.21             - doing well  -- BCVA 20/50             - IOP elevated to 33 -- ?steroid response             - cont   PF TID OS                         Cosopt BID OS   Ketorolac TID OS -- okay to stop                         PSO ung QID, prn OS   - restart Brimonidine BID OS             - post op drops reviewed              - tylenol/ibuprofen for pain   - f/u 1-2 weeks, IOP check  5. No retinal edema on exam or OCT  6. Pseudophakia OU  - s/p CE/IOL OU -- pt can't remember dates, one eye by [facility], other eye by [facility]  -  UGH OS and now sutured IOL as  above  - PCIOL OD appears to be in good position  - monitor  7. Lyme disease  - history of diagnosis from target lesions on lower extremities  - no blood test confirmation performed  - completed doxycycline per PCP office  - no manifestation in the eyes, but will continue to monitor  8,9. Brow ptosis, dermatochalasis OU  - referred to Dr. Vickki Muff (now at [facility]) for eyelid eval  - holding on repair for now     Ophthalmic Meds Ordered this visit:  No orders of the defined types were placed in this encounter.      Return for f/u 1-2 weeks, IOP check OS.  There are no Patient Instructions on file for this visit.   Explained the diagnoses, plan, and follow up with the patient and they expressed understanding.  Patient expressed understanding of the importance of proper follow up care.   This document serves as a record of services personally performed by Gardiner Sleeper, MD, PhD. It was created on their behalf by Leeann Must, [place], a certified ophthalmic assistant. The creation of this record is the provider's dictation and/or activities during the visit.    Electronically signed by: Leeann Must, COA @TODAY @ 9:28 AM  This document serves as a record of services personally performed by Gardiner Sleeper, MD, PhD. It was created on their behalf by Ernest Mallick, OA, an ophthalmic assistant. The creation of this record is the provider's dictation and/or activities during the visit.    Electronically signed by: Ernest Mallick, OA 05.03.2021 9:28 AM  Gardiner Sleeper, M.D., Ph.D. Diseases & Surgery of the Retina and Vitreous [facility]  I have reviewed the above documentation for accuracy and completeness, and I agree with the above. Gardiner Sleeper, M.D., Ph.D. 04/29/20 9:28 AM    Abbreviations: M myopia (nearsighted); A astigmatism; H hyperopia (farsighted); P presbyopia; Mrx spectacle prescription;  CTL contact lenses; OD right eye; OS left eye; OU both  eyes  XT exotropia; ET esotropia; PEK punctate epithelial keratitis; PEE punctate epithelial erosions; DES dry eye syndrome; MGD meibomian gland dysfunction; ATs artificial tears; PFAT's preservative free artificial tears; [place] nuclear sclerotic cataract; PSC posterior subcapsular cataract; ERM epi-retinal membrane; PVD posterior vitreous detachment; RD retinal detachment; DM diabetes mellitus; DR diabetic retinopathy; NPDR non-proliferative diabetic retinopathy; PDR proliferative diabetic retinopathy; CSME clinically significant macular edema; DME diabetic macular edema; dbh dot blot hemorrhages; CWS cotton wool spot; POAG primary open angle glaucoma; C/D cup-to-disc ratio; HVF humphrey visual field; GVF goldmann visual field; OCT optical coherence tomography; IOP intraocular pressure; BRVO Branch retinal vein occlusion; CRVO central retinal vein occlusion; CRAO central retinal artery occlusion; BRAO branch retinal artery occlusion; RT retinal tear; SB scleral buckle; PPV pars plana vitrectomy; VH Vitreous hemorrhage; PRP panretinal laser photocoagulation; IVK intravitreal kenalog; VMT vitreomacular traction; MH Macular hole;  NVD neovascularization of the disc; NVE neovascularization elsewhere; AREDS age related eye disease study; ARMD age related macular degeneration; POAG primary open angle glaucoma; EBMD epithelial/anterior basement membrane dystrophy; ACIOL anterior chamber intraocular lens; IOL intraocular lens; PCIOL posterior chamber intraocular lens; Phaco/IOL phacoemulsification with intraocular lens placement; [place] photorefractive keratectomy; LASIK laser assisted in situ keratomileusis; HTN hypertension; DM diabetes mellitus; COPD chronic obstructive pulmonary disease

## 2020-04-29 ENCOUNTER — Encounter (INDEPENDENT_AMBULATORY_CARE_PROVIDER_SITE_OTHER): Payer: Self-pay | Admitting: Ophthalmology

## 2020-04-29 ENCOUNTER — Other Ambulatory Visit: Payer: Self-pay

## 2020-04-29 ENCOUNTER — Ambulatory Visit (INDEPENDENT_AMBULATORY_CARE_PROVIDER_SITE_OTHER): Payer: Medicare Other | Admitting: Ophthalmology

## 2020-04-29 DIAGNOSIS — H209 Unspecified iridocyclitis: Secondary | ICD-10-CM

## 2020-04-29 DIAGNOSIS — H3581 Retinal edema: Secondary | ICD-10-CM | POA: Diagnosis not present

## 2020-04-29 DIAGNOSIS — Z961 Presence of intraocular lens: Secondary | ICD-10-CM | POA: Diagnosis not present

## 2020-04-29 DIAGNOSIS — T85398A Other mechanical complication of other ocular prosthetic devices, implants and grafts, initial encounter: Secondary | ICD-10-CM

## 2020-04-29 DIAGNOSIS — H57819 Brow ptosis, unspecified: Secondary | ICD-10-CM | POA: Diagnosis not present

## 2020-04-29 DIAGNOSIS — H02831 Dermatochalasis of right upper eyelid: Secondary | ICD-10-CM | POA: Diagnosis not present

## 2020-04-29 DIAGNOSIS — H02834 Dermatochalasis of left upper eyelid: Secondary | ICD-10-CM | POA: Diagnosis not present

## 2020-04-29 DIAGNOSIS — H4042X0 Glaucoma secondary to eye inflammation, left eye, stage unspecified: Secondary | ICD-10-CM

## 2020-04-29 DIAGNOSIS — A6920 Lyme disease, unspecified: Secondary | ICD-10-CM | POA: Diagnosis not present

## 2020-04-29 DIAGNOSIS — H27122 Anterior dislocation of lens, left eye: Secondary | ICD-10-CM | POA: Diagnosis not present

## 2020-04-29 DIAGNOSIS — H401321 Pigmentary glaucoma, left eye, mild stage: Secondary | ICD-10-CM | POA: Diagnosis not present

## 2020-04-30 DIAGNOSIS — E119 Type 2 diabetes mellitus without complications: Secondary | ICD-10-CM | POA: Diagnosis not present

## 2020-04-30 DIAGNOSIS — E782 Mixed hyperlipidemia: Secondary | ICD-10-CM | POA: Diagnosis not present

## 2020-04-30 DIAGNOSIS — Z87891 Personal history of nicotine dependence: Secondary | ICD-10-CM | POA: Diagnosis not present

## 2020-04-30 DIAGNOSIS — I10 Essential (primary) hypertension: Secondary | ICD-10-CM | POA: Diagnosis not present

## 2020-04-30 DIAGNOSIS — E039 Hypothyroidism, unspecified: Secondary | ICD-10-CM | POA: Diagnosis not present

## 2020-04-30 DIAGNOSIS — Z125 Encounter for screening for malignant neoplasm of prostate: Secondary | ICD-10-CM | POA: Diagnosis not present

## 2020-05-02 DIAGNOSIS — J439 Emphysema, unspecified: Secondary | ICD-10-CM | POA: Diagnosis not present

## 2020-05-02 DIAGNOSIS — I10 Essential (primary) hypertension: Secondary | ICD-10-CM | POA: Diagnosis not present

## 2020-05-02 DIAGNOSIS — E782 Mixed hyperlipidemia: Secondary | ICD-10-CM | POA: Diagnosis not present

## 2020-05-02 DIAGNOSIS — E039 Hypothyroidism, unspecified: Secondary | ICD-10-CM | POA: Diagnosis not present

## 2020-05-02 DIAGNOSIS — Z6828 Body mass index (BMI) 28.0-28.9, adult: Secondary | ICD-10-CM | POA: Diagnosis not present

## 2020-05-02 DIAGNOSIS — I252 Old myocardial infarction: Secondary | ICD-10-CM | POA: Diagnosis not present

## 2020-05-02 DIAGNOSIS — E1151 Type 2 diabetes mellitus with diabetic peripheral angiopathy without gangrene: Secondary | ICD-10-CM | POA: Diagnosis not present

## 2020-05-02 DIAGNOSIS — I25119 Atherosclerotic heart disease of native coronary artery with unspecified angina pectoris: Secondary | ICD-10-CM | POA: Diagnosis not present

## 2020-05-03 ENCOUNTER — Encounter (INDEPENDENT_AMBULATORY_CARE_PROVIDER_SITE_OTHER): Payer: Medicare Other | Admitting: Ophthalmology

## 2020-05-10 DIAGNOSIS — S46219A Strain of muscle, fascia and tendon of other parts of biceps, unspecified arm, initial encounter: Secondary | ICD-10-CM | POA: Diagnosis not present

## 2020-05-13 DIAGNOSIS — E1151 Type 2 diabetes mellitus with diabetic peripheral angiopathy without gangrene: Secondary | ICD-10-CM | POA: Diagnosis not present

## 2020-05-13 DIAGNOSIS — Z6828 Body mass index (BMI) 28.0-28.9, adult: Secondary | ICD-10-CM | POA: Diagnosis not present

## 2020-05-13 DIAGNOSIS — I10 Essential (primary) hypertension: Secondary | ICD-10-CM | POA: Diagnosis not present

## 2020-05-13 DIAGNOSIS — M10072 Idiopathic gout, left ankle and foot: Secondary | ICD-10-CM | POA: Diagnosis not present

## 2020-05-16 ENCOUNTER — Ambulatory Visit (INDEPENDENT_AMBULATORY_CARE_PROVIDER_SITE_OTHER): Payer: Medicare Other | Admitting: Orthopaedic Surgery

## 2020-05-16 ENCOUNTER — Other Ambulatory Visit: Payer: Self-pay

## 2020-05-16 ENCOUNTER — Encounter: Payer: Self-pay | Admitting: Orthopaedic Surgery

## 2020-05-16 DIAGNOSIS — S46212A Strain of muscle, fascia and tendon of other parts of biceps, left arm, initial encounter: Secondary | ICD-10-CM | POA: Diagnosis not present

## 2020-05-16 DIAGNOSIS — I6523 Occlusion and stenosis of bilateral carotid arteries: Secondary | ICD-10-CM | POA: Diagnosis not present

## 2020-05-16 DIAGNOSIS — S46211A Strain of muscle, fascia and tendon of other parts of biceps, right arm, initial encounter: Secondary | ICD-10-CM | POA: Diagnosis not present

## 2020-05-16 NOTE — Progress Notes (Signed)
Office Visit Note   Patient: Christopher Randolph           Date of Birth: 07-Dec-1946           MRN: [id number] Visit Date: 05/16/2020              Requested by: Curlene Labrum, MD [place],  [place] [address] PCP: Curlene Labrum, MD   Assessment & Plan: Visit Diagnoses: Biceps tendon rupture, proximal, right, initial encounter   Plan: Patient has tear along his biceps tendon.  His rotator cuff is intact.  We discussed 10% improved power with reattachment of the tendon into the bicipital groove.  At his age and activity level he should do quite well with conservative treatment.  He will avoid doing heavy pulling or lifting with this arm for several weeks.  He can return to see me in 3 weeks if he wants to discuss biceps tenodesis.  He understands with 90% eventual elbow flexion he should be back to doing just about any activity once.  Follow-Up Instructions: Return in about 3 weeks (around 06/06/2020).   Orders:  No orders of the defined types were placed in this encounter.  No orders of the defined types were placed in this encounter.     Procedures: No procedures performed   Clinical Data: No additional findings.   Subjective: Chief Complaint  Patient presents with  . Right Arm - Pain    HPI 74 year old male with change a tire in a camper about 2 weeks ago noticed a pop in his shoulder.  Later noticed ecchymosis and prominence of the biceps tendon distally in the right dominant arm.  He states he has had some soreness with it.  On 5 9 he will was releasing the pin and again felt some soreness in this.  No past problems with rotator cuff he can still get his arm up over his head sensation his hand is intact.  He had x-rays of his shoulder or humerus 2 views which showed normal anatomy no lytic or sclerotic lesions.  Patient was sent here by Dr. Pleas Koch.  Review of Systems is system positive for history of heart disease hypertension positive tobacco use lumbar  stenosis.  Patient's been active and considers self in good health.   Objective: Vital Signs: BP (!) 155/86   Pulse (!) 50   Ht 5\' 8"  (1.727 m)   Wt 175 lb (79.4 kg)   BMI 26.61 kg/m   Physical Exam Constitutional:      Appearance: He is well-developed.  HENT:     Head: Normocephalic and atraumatic.  Eyes:     Pupils: Pupils are equal, round, and reactive to light.  Neck:     Thyroid: No thyromegaly.     Trachea: No tracheal deviation.  Cardiovascular:     Rate and Rhythm: Normal rate.  Pulmonary:     Effort: Pulmonary effort is normal.     Breath sounds: No wheezing.  Abdominal:     General: Bowel sounds are normal.     Palpations: Abdomen is soft.  Skin:    General: Skin is warm and dry.     Capillary Refill: Capillary refill takes less than 2 seconds.  Neurological:     Mental Status: He is alert and oriented to person, place, and time.  Psychiatric:        Behavior: Behavior normal.        Thought Content: Thought content normal.  Judgment: Judgment normal.     Ortho Exam patient has tenderness of the bicipital groove on the right.  Biceps on the left arm is normal.  On the right arm there is distal migration of the biceps muscle consistent with long head of the biceps tendon rupture.  Palpation of the right biceps groove shows some tenderness but the tendon is not palpable proximally.  Short head of the biceps is intact.  Patient is able get his arm up over his head good range of motion cervical spine sensation hand is normal.  Specialty Comments:  No specialty comments available.  Imaging: No results found.   PMFS History: Patient Active Problem List   Diagnosis Date Noted  . Biceps tendon rupture, proximal, left, initial encounter 05/16/2020  . 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 Heme positive stool 10/30/2016  . Systolic murmur   . Dizziness   . Carotid artery disease ([place])   . PAD (peripheral artery disease) ([place])   .  CAD (coronary artery disease)   . Hypertension   . Hypothyroidism   . History of tobacco abuse   . Ejection fraction   . Dyslipidemia    Past Medical History:  Diagnosis Date  . Arthritis   . CAD (coronary artery disease)    Branch vessel and moderate RCA disease 2006  . Cancer ([place]) 1990   Melanoma Lower right Leg  . Carotid artery disease ([place])   . Cataracts, bilateral    removed by surgery  . Diabetes ([place])    type 2  . Essential hypertension   . Glaucoma   . Hyperlipidemia   . Hypothyroidism   . Lyme disease   . PAD (peripheral artery disease) ([place])    Left common iliac stent 2004  . Pneumonia    x 1  . PONV (postoperative nausea and vomiting)    blood pressure dropped  . Wears dentures   . Wears glasses     Family History  Problem Relation Age of Onset  . Heart disease Mother   . Heart attack Mother   . Colon polyps Mother   . Heart attack Maternal Grandmother   . Heart attack Maternal Grandfather   . Colon polyps Brother   . Colon cancer Neg Hx     Past Surgical History:  Procedure Laterality Date  . CATARACT EXTRACTION Bilateral   . CATARACT EXTRACTION, BILATERAL    . CERVICAL DISC SURGERY     x 1  . COLONOSCOPY N/A 11/06/2016   Procedure: COLONOSCOPY;  Surgeon: Daneil Dolin, MD;  [facility];  Service: Endoscopy;  Laterality: N/A;  7:30 AM  . COLONOSCOPY  2012   Dr. Anthony Sar: normal. reviewed reports, which states he has a history of polyps in remote past.   . EYE SURGERY    . JOINT REPLACEMENT    . LUMBAR WOUND DEBRIDEMENT N/A 01/24/2019   Procedure: LUMBAR WOUND Exploration for Evacation of Seroma vs. Hematoma;  Surgeon: Jovita Gamma, MD;  Location: [place];  Service: Neurosurgery;  Laterality: N/A;  . MELANOMA EXCISION     right leg, [facility] at [facility] and underwent immunotherapy  . NECK SURGERY      x 1 yrs ago  . PARS PLANA VITRECTOMY Left 04/04/2020   Procedure: PARS PLANA VITRECTOMY WITH 25G REMOVAL/SUTURE INTRAOCULAR LENS;  Surgeon:  [name], MD;  Location: [place];  Service: Ophthalmology;  Laterality: Left;  . REPLACEMENT TOTAL KNEE Right   . WISDOM TOOTH EXTRACTION  Social History   Occupational History  . Not on file  Tobacco Use  . Smoking status: Former Smoker    Packs/day: 1.50    Years: 40.00    Pack years: 60.00    Types: Cigarettes    Start date: 11/04/1963    Quit date: 10/29/2003    Years since quitting: 16.5  . Smokeless tobacco: Never Used  Substance and Sexual Activity  . Alcohol use: Yes    Alcohol/week: 7.0 standard drinks    Types: 7 Standard drinks or equivalent per week    Comment: one miller lite daily   . Drug use: No  . Sexual activity: Not on file

## 2020-05-20 NOTE — Progress Notes (Signed)
[facility] Note  05/21/2020     CHIEF COMPLAINT Patient presents for Retina Follow Up   HISTORY OF PRESENT ILLNESS: Christopher Randolph is a 74 y.o. male who presents to the clinic today for:   HPI    Retina Follow Up    Patient presents with  Other.  In left eye.  This started months ago.  Severity is moderate.  Duration of 3 weeks.  Since onset it is gradually improving.  I, the attending physician,  performed the HPI with the patient and updated documentation appropriately.          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  S/p PPV w/IOL exchange/sutured secondary Akreos IOL 4.8.21.  VA OS continues to improve.  No change in [place] OD.  Denies pain, FOL.  Has occasional floaters OU.  PF TID OS Cosopt BID OS PSO ung QHS OS Brimonidine BID OS       Last edited by [name], MD on 05/21/2020  8:28 AM. (History)    Patient feels like his vision is getting better, he has not seen Dr. [name] recently  Referring physician: Curlene Labrum, MD [place],  [place] [id number]  HISTORICAL INFORMATION:   Selected notes from the MEDICAL RECORD NUMBER Referred by Dr. [name] for concern of vitreous hemorrhage    CURRENT MEDICATIONS: Current Outpatient Medications (Ophthalmic Drugs)  Medication Sig  . bacitracin-polymyxin b (POLYSPORIN) ophthalmic ointment Place into the left eye at bedtime. Place a 1/2 inch ribbon of ointment into the lower eyelid at bedtime and as needed  . brimonidine (ALPHAGAN) 0.2 % ophthalmic solution Place 1 drop into the left eye 3 (three) times daily.  . dorzolamide-timolol (COSOPT) 22.3-6.8 MG/ML ophthalmic solution Place 1 drop into the left eye 2 (two) times daily.  . Loteprednol Etabonate (INVELTYS) 1 % SUSP Apply 1 drop to eye 2 (two) times a day. 1 drop 2 times daily left eye (Patient taking differently: Place 1 drop into the left eye in the morning and at bedtime. )  . prednisoLONE  acetate (PRED FORTE) 1 % ophthalmic suspension Place 1 drop into the left eye 3 (three) times daily.  Marland Kitchen ketorolac (ACULAR) 0.5 % ophthalmic solution Place 1 drop into the left eye 3 (three) times daily. (Patient not taking: Reported on 05/21/2020)  . Netarsudil-Latanoprost (ROCKLATAN) 0.02-0.005 % SOLN Place 1 drop into the left eye at bedtime.   No current facility-administered medications for this visit. (Ophthalmic Drugs)   Current Outpatient Medications (Other)  Medication Sig  . aspirin EC 81 MG tablet Take 81 mg by mouth daily.    Marland Kitchen atorvastatin (LIPITOR) 20 MG tablet TAKE 1 TABLET BY MOUTH DAILY (Patient taking differently: Take 20 mg by mouth daily. )  . fluticasone (FLONASE ALLERGY RELIEF) 50 MCG/ACT nasal spray Place 2 sprays into both nostrils daily.   Marland Kitchen levothyroxine (SYNTHROID, LEVOTHROID) 150 MCG tablet Take 150 mcg by mouth See admin instructions. Alternates taking 150 mg daily for 2 days then 175 mcg the third day then repeat  . levothyroxine (SYNTHROID, LEVOTHROID) 175 MCG tablet Take 175 mcg by mouth See admin instructions. Alternates taking 150 mg daily for 2 days then 175 mcg the third day then repeat  . metFORMIN (GLUCOPHAGE) 500 MG tablet Take 500 mg by mouth daily.   . metoprolol tartrate (LOPRESSOR) 25 MG tablet Take 25 mg by mouth  2 (two) times daily.   Marland Kitchen MITIGARE 0.6 MG CAPS TcT BY MOUTH AT ONE TIME, THEN TAKE 1 CAPSULE ONE HOUR. TAKE AS NEEDED FOR FOR GOUT ATTACKS  . mometasone (ELOCON) 0.1 % ointment Apply 1 application topically daily.   . naproxen sodium (ANAPROX) 220 MG tablet Take 220 mg by mouth daily as needed (for pain.).   Marland Kitchen nitroGLYCERIN (NITROSTAT) 0.4 MG SL tablet DISSOLVE 1 TABLET UNDER THE TONGUE EVERY 5 MINUTES AS NEEDED FOR CHEST PAIN. DO NOT EXCEED A TOTAL OF 3 DOSES IN 15 MINUTES.  . tamsulosin (FLOMAX) 0.4 MG CAPS capsule Take 0.4 mg by mouth 2 (two) times daily.  Marland Kitchen telmisartan (MICARDIS) 80 MG tablet Take 1 tablet (80 mg total) by mouth daily.  Marland Kitchen  acetaZOLAMIDE (DIAMOX) 250 MG tablet Take 250 mg by mouth 3 (three) times daily.   No current facility-administered medications for this visit. (Other)      REVIEW OF SYSTEMS: ROS    Positive for: Cardiovascular, Eyes   Negative for: Constitutional, Gastrointestinal, Neurological, Skin, Genitourinary, Musculoskeletal, HENT, Endocrine, Respiratory, Psychiatric, Allergic/Imm, Heme/Lymph   Last edited by Matthew Folks, COA on 05/21/2020  8:09 AM. (History)       ALLERGIES Allergies  Allergen Reactions  . Azithromycin Swelling  . Shrimp [Shellfish Allergy] Other (See Comments)    Gout flares    PAST MEDICAL HISTORY Past Medical History:  Diagnosis Date  . Arthritis   . CAD (coronary artery disease)    Branch vessel and moderate RCA disease 2006  . Cancer ([place]) 1990   Melanoma Lower right Leg  . Carotid artery disease ([place])   . Cataracts, bilateral    removed by surgery  . Diabetes ([place])    type 2  . Essential hypertension   . Glaucoma   . Hyperlipidemia   . Hypothyroidism   . Lyme disease   . PAD (peripheral artery disease) ([place])    Left common iliac stent 2004  . Pneumonia    x 1  . PONV (postoperative nausea and vomiting)    blood pressure dropped  . Wears dentures   . Wears glasses    Past Surgical History:  Procedure Laterality Date  . CATARACT EXTRACTION Bilateral   . CATARACT EXTRACTION, BILATERAL    . CERVICAL DISC SURGERY     x 1  . COLONOSCOPY N/A 11/06/2016   Procedure: COLONOSCOPY;  Surgeon: Daneil Dolin, MD;  [facility];  Service: Endoscopy;  Laterality: N/A;  7:30 AM  . COLONOSCOPY  2012   Dr. Anthony Sar: normal. reviewed reports, which states he has a history of polyps in remote past.   . EYE SURGERY    . JOINT REPLACEMENT    . LUMBAR WOUND DEBRIDEMENT N/A 01/24/2019   Procedure: LUMBAR WOUND Exploration for Evacation of Seroma vs. Hematoma;  Surgeon: Jovita Gamma, MD;  Location: [place];  Service: Neurosurgery;  Laterality:  N/A;  . MELANOMA EXCISION     right leg, [facility] and underwent immunotherapy  . NECK SURGERY      x 1 yrs ago  . PARS PLANA VITRECTOMY Left 04/04/2020   Procedure: PARS PLANA VITRECTOMY WITH 25G REMOVAL/SUTURE INTRAOCULAR LENS;  Surgeon: [name], MD;  Location: [place];  Service: Ophthalmology;  Laterality: Left;  . REPLACEMENT TOTAL KNEE Right   . WISDOM TOOTH EXTRACTION      FAMILY HISTORY Family History  Problem Relation Age of Onset  . Heart disease Mother   . Heart attack Mother   .  Colon polyps Mother   . Heart attack Maternal Grandmother   . Heart attack Maternal Grandfather   . Colon polyps Brother   . Colon cancer Neg Hx     SOCIAL HISTORY Social History   Tobacco Use  . Smoking status: Former Smoker    Packs/day: 1.50    Years: 40.00    Pack years: 60.00    Types: Cigarettes    Start date: 11/04/1963    Quit date: 10/29/2003    Years since quitting: 16.5  . Smokeless tobacco: Never Used  Substance Use Topics  . Alcohol use: Yes    Alcohol/week: 7.0 standard drinks    Types: 7 Standard drinks or equivalent per week    Comment: one miller lite daily   . Drug use: No         OPHTHALMIC EXAM:  Base Eye Exam    Visual Acuity (Snellen - Linear)      Right Left   Dist cc 20/20 20/40 -2   Dist ph cc  NI   Correction: Glasses       Tonometry (Tonopen, 8:13 AM)      Right Left   Pressure Def 28       Pupils      Dark Light Shape React APD   Right 3 2 Round Brisk None   Left 3 2 Round Minimal None       Visual Fields (Counting fingers)      Left Right    Full Full       Extraocular Movement      Right Left    Full, Ortho Full, Ortho       Neuro/Psych    Oriented x3: Yes   Mood/Affect: Normal       Dilation    Left eye: 1.0% Mydriacyl, 2.5% Phenylephrine @ 8:13 AM        Slit Lamp and Fundus Exam    External Exam      Right Left   External brow ptosis brow ptosis; mild periorbital edema and erythema       Slit Lamp Exam       Right Left   Lids/Lashes Dermatochalasis - upper lid, Telangiectasia, mild Meibomian gland dysfunction Dermatochalasis, mild Ptosis   Conjunctiva/Sclera superior and temporal Pinguecula sutures dissolved, temporal Pinguecula   Cornea mild arcus, 1+ Punctate epithelial erosions mild arcus, trace Punctate epithelial erosions, 3 nylon sutures at 12, well healed superior cataract wounds   Anterior Chamber Deep and quiet, no cell/flare Deep, 0.5+pigment   Iris No NVI, Round and dilated No NVI, moderately dilated, Transillumination defects from 0100-0200   Lens Posterior chamber intraocular lens in good position Sutured Akreos sutured IOL in excellent position   Vitreous vitreous syneresis post vitrectomy, clear       Fundus Exam      Right Left   Disc Pink and Sharp Pink and Sharp, Compact   C/D Ratio 0.2 0.2   Macula Flat, mild RPE mottling, No heme or edema Flat, blunted foveal reflex, mild RPE mottling, No heme or edema   Vessels normal, mildly Tortuous Vascular attenuation, mild Tortuousity   Periphery Attached, round focal area of CR atrophy at 0630 mid zone Attached          IMAGING AND PROCEDURES  Imaging and Procedures for @TODAY @  OCT, Retina - OU - Both Eyes       Right Eye Quality was good. Central Foveal Thickness: 273. Progression has been stable. Findings include normal foveal  contour, no SRF, no IRF (partial PVD - stable).   Left Eye Quality was good. Central Foveal Thickness: 282. Progression has been stable. Findings include normal foveal contour, no IRF, no SRF.   Notes *Images captured and stored on drive  Diagnosis / Impression:  NFP, no IRF, no SRF OU Partial PVD OD stable  Clinical management:  See below  Abbreviations: NFP - Normal foveal profile. CME - cystoid macular edema. PED - pigment epithelial detachment. IRF - intraretinal fluid. SRF - subretinal fluid. EZ - ellipsoid zone. ERM - epiretinal membrane. ORA - outer retinal atrophy. ORT - outer  retinal tubulation. 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 Uveitis-hyphema-glaucoma syndrome of left eye  T85.398A    H20.9    H40.42X0   3. Pigmentary glaucoma of left eye, mild stage  H40.1321   4. Dislocated IOL (intraocular lens), anterior, left  H27.122   5. Retinal edema  H35.81 OCT, Retina - OU - Both Eyes  6. Pseudophakia of both eyes  Z96.1   7. Dermatochalasis of both upper eyelids  H02.831    H02.834   8. Brow ptosis  H57.819   9. Lyme disease  A69.20    1-4. UGH Syndrome OS  - 3 piece PCIOL OS centered, but iris has large TID in sup temp quadrant with IOL haptic within area  - d/c'd Inveltys on 10/10/18 but had flare up on 11/29/18 and was restarted  - tmax at [facility] and was started on max drops (Cosopt, Brim, latan, and po diamox)  - repeat gonio showed open angles OS, mild focal PAS  - now POW7 PPV w/ IOL exchange / sutured secondary Akreos OS IOL 04.08.21             - doing well  -- BCVA 20/40             - IOP remains elevated to 28 (from 33) -- ?steroid response             - dec PF to Qdaily for one week, then stop  - cont Cosopt BID OS -- pt reports irritation when using   Brim TID OS                         PSO prn OS              - post op drops reviewed              - tylenol/ibuprofen for pain   - f/u 4 weeks, POV, MRx/K's  - will make appt with Dr. Midge Aver for IOP management  5. No retinal edema on exam or OCT  6. Pseudophakia OU  - s/p CE/IOL OU -- pt can't remember dates, one eye by [facility], other eye by [facility] and now sutured IOL as above  - PCIOL OD appears to be in good position  - monitor  7. Lyme disease  - history of diagnosis from target lesions on lower extremities  - no blood test confirmation performed  - completed doxycycline per PCP office  - no manifestation in the eyes, but will continue to  monitor  8,9. Brow ptosis, dermatochalasis OU  - referred to Dr. Vickki Muff (now at [facility]) for eyelid eval  -  holding on repair for now     Ophthalmic Meds Ordered this visit:  No orders of the defined types were placed in this encounter.      Return in about 4 weeks (around 06/18/2020) for f/u POV OS, DFE, OCT, MRx.  There are no Patient Instructions on file for this visit.   Explained the diagnoses, plan, and follow up with the patient and they expressed understanding.  Patient expressed understanding of the importance of proper follow up care.   This document serves as a record of services personally performed by Gardiner Sleeper, MD, PhD. It was created on their behalf by Estill Bakes, COT an ophthalmic technician. The creation of this record is the provider's dictation and/or activities during the visit.    Electronically signed by: Estill Bakes, COT 05/20/20 @ 11:29 PM   This document serves as a record of services personally performed by Gardiner Sleeper, MD, PhD. It was created on their behalf by Ernest Mallick, OA, an ophthalmic assistant. The creation of this record is the provider's dictation and/or activities during the visit.    Electronically signed by: Ernest Mallick, OA 05.25.2021 11:29 PM  Gardiner Sleeper, M.D., Ph.D. Diseases & Surgery of the Retina and Vitreous [facility] 05/21/2020   I have reviewed the above documentation for accuracy and completeness, and I agree with the above. Gardiner Sleeper, M.D., Ph.D. 05/21/20 11:29 PM   Abbreviations: M myopia (nearsighted); A astigmatism; H hyperopia (farsighted); P presbyopia; Mrx spectacle prescription;  CTL contact lenses; OD right eye; OS left eye; OU both eyes  XT exotropia; ET esotropia; PEK punctate epithelial keratitis; PEE punctate epithelial erosions; DES dry eye syndrome; MGD meibomian gland dysfunction; ATs artificial tears; PFAT's preservative free artificial tears; [place] nuclear sclerotic  cataract; PSC posterior subcapsular cataract; ERM epi-retinal membrane; PVD posterior vitreous detachment; RD retinal detachment; DM diabetes mellitus; DR diabetic retinopathy; NPDR non-proliferative diabetic retinopathy; PDR proliferative diabetic retinopathy; CSME clinically significant macular edema; DME diabetic macular edema; dbh dot blot hemorrhages; CWS cotton wool spot; POAG primary open angle glaucoma; C/D cup-to-disc ratio; HVF humphrey visual field; GVF goldmann visual field; OCT optical coherence tomography; IOP intraocular pressure; BRVO Branch retinal vein occlusion; CRVO central retinal vein occlusion; CRAO central retinal artery occlusion; BRAO branch retinal artery occlusion; RT retinal tear; SB scleral buckle; PPV pars plana vitrectomy; VH Vitreous hemorrhage; PRP panretinal laser photocoagulation; IVK intravitreal kenalog; VMT vitreomacular traction; MH Macular hole;  NVD neovascularization of the disc; NVE neovascularization elsewhere; AREDS age related eye disease study; ARMD age related macular degeneration; POAG primary open angle glaucoma; EBMD epithelial/anterior basement membrane dystrophy; ACIOL anterior chamber intraocular lens; IOL intraocular lens; PCIOL posterior chamber intraocular lens; Phaco/IOL phacoemulsification with intraocular lens placement; [place] photorefractive keratectomy; LASIK laser assisted in situ keratomileusis; HTN hypertension; DM diabetes mellitus; COPD chronic obstructive pulmonary disease

## 2020-05-21 ENCOUNTER — Other Ambulatory Visit: Payer: Self-pay

## 2020-05-21 ENCOUNTER — Encounter (INDEPENDENT_AMBULATORY_CARE_PROVIDER_SITE_OTHER): Payer: Self-pay | Admitting: Ophthalmology

## 2020-05-21 ENCOUNTER — Ambulatory Visit (INDEPENDENT_AMBULATORY_CARE_PROVIDER_SITE_OTHER): Payer: Medicare Other | Admitting: Ophthalmology

## 2020-05-21 DIAGNOSIS — T85398A Other mechanical complication of other ocular prosthetic devices, implants and grafts, initial encounter: Secondary | ICD-10-CM

## 2020-05-21 DIAGNOSIS — H209 Unspecified iridocyclitis: Secondary | ICD-10-CM

## 2020-05-21 DIAGNOSIS — H3581 Retinal edema: Secondary | ICD-10-CM | POA: Diagnosis not present

## 2020-05-21 DIAGNOSIS — Z961 Presence of intraocular lens: Secondary | ICD-10-CM | POA: Diagnosis not present

## 2020-05-21 DIAGNOSIS — H57819 Brow ptosis, unspecified: Secondary | ICD-10-CM

## 2020-05-21 DIAGNOSIS — H02834 Dermatochalasis of left upper eyelid: Secondary | ICD-10-CM | POA: Diagnosis not present

## 2020-05-21 DIAGNOSIS — H401321 Pigmentary glaucoma, left eye, mild stage: Secondary | ICD-10-CM

## 2020-05-21 DIAGNOSIS — H4042X0 Glaucoma secondary to eye inflammation, left eye, stage unspecified: Secondary | ICD-10-CM

## 2020-05-21 DIAGNOSIS — A6920 Lyme disease, unspecified: Secondary | ICD-10-CM | POA: Diagnosis not present

## 2020-05-21 DIAGNOSIS — H02831 Dermatochalasis of right upper eyelid: Secondary | ICD-10-CM

## 2020-05-21 DIAGNOSIS — H27122 Anterior dislocation of lens, left eye: Secondary | ICD-10-CM | POA: Diagnosis not present

## 2020-06-07 ENCOUNTER — Other Ambulatory Visit: Payer: Self-pay

## 2020-06-07 ENCOUNTER — Ambulatory Visit (INDEPENDENT_AMBULATORY_CARE_PROVIDER_SITE_OTHER): Payer: Medicare Other | Admitting: Ophthalmology

## 2020-06-07 ENCOUNTER — Encounter (INDEPENDENT_AMBULATORY_CARE_PROVIDER_SITE_OTHER): Payer: Self-pay | Admitting: Ophthalmology

## 2020-06-07 DIAGNOSIS — H27122 Anterior dislocation of lens, left eye: Secondary | ICD-10-CM

## 2020-06-07 DIAGNOSIS — Z961 Presence of intraocular lens: Secondary | ICD-10-CM

## 2020-06-07 DIAGNOSIS — H02834 Dermatochalasis of left upper eyelid: Secondary | ICD-10-CM

## 2020-06-07 DIAGNOSIS — A6920 Lyme disease, unspecified: Secondary | ICD-10-CM

## 2020-06-07 DIAGNOSIS — H209 Unspecified iridocyclitis: Secondary | ICD-10-CM | POA: Diagnosis not present

## 2020-06-07 DIAGNOSIS — H401321 Pigmentary glaucoma, left eye, mild stage: Secondary | ICD-10-CM | POA: Diagnosis not present

## 2020-06-07 DIAGNOSIS — H3581 Retinal edema: Secondary | ICD-10-CM | POA: Diagnosis not present

## 2020-06-07 DIAGNOSIS — T85398A Other mechanical complication of other ocular prosthetic devices, implants and grafts, initial encounter: Secondary | ICD-10-CM | POA: Diagnosis not present

## 2020-06-07 DIAGNOSIS — H4042X0 Glaucoma secondary to eye inflammation, left eye, stage unspecified: Secondary | ICD-10-CM

## 2020-06-07 DIAGNOSIS — H57819 Brow ptosis, unspecified: Secondary | ICD-10-CM

## 2020-06-07 DIAGNOSIS — H02831 Dermatochalasis of right upper eyelid: Secondary | ICD-10-CM

## 2020-06-07 MED ORDER — LOTEPREDNOL ETABONATE 0.5 % OP SUSP: 1.0000 [drp] | Freq: Two times a day (BID) | OPHTHALMIC | 1 refills | Status: DC

## 2020-06-07 NOTE — Progress Notes (Signed)
[facility] Note  06/07/2020     CHIEF COMPLAINT Patient presents for Eye Problem (Pt c/o sudden onset tearing, burning, itching and photophobia OS x 3 days.)   HISTORY OF PRESENT ILLNESS: Christopher Randolph is a 74 y.o. male who presents to the clinic today for:   HPI    Eye Problem     Additional comments: Pt c/o sudden onset tearing, burning, itching and photophobia OS x 3 days.          Comments    73 y/o male ptt c/o sudden onset tearing, burning, itching and photophobia OS x 3 days.  No problems reported OD.  No change in [place] OU.  Denies pain, FOL, floaters.  Had a headache yesterday, which is unusual for him.  Cosopt BID OS, Brim TID OS.       Last edited by Matthew Folks, COA on 06/07/2020  8:40 AM. (History)    Patient states he stopped PF after his last visit with Dr. [name], right now he is only using brim and Cosopt, he states for the past 3-4 days his eyes have been very sensitive to light and tearing a lot, 2 days ago he could barely open his eye bc of sensitivity and pain, but it has gotten better since then, he states he has used zaditor a couple of times and it does seem to relieve the pain  Referring physician: Curlene Labrum, MD [place],  [place] [id number]  HISTORICAL INFORMATION:   Selected notes from the MEDICAL RECORD NUMBER Referred by Dr. [name] for concern of vitreous hemorrhage    CURRENT MEDICATIONS: Current Outpatient Medications (Ophthalmic Drugs)  Medication Sig  . brimonidine (ALPHAGAN) 0.2 % ophthalmic solution Place 1 drop into the left eye 3 (three) times daily.  . dorzolamide-timolol (COSOPT) 22.3-6.8 MG/ML ophthalmic solution Place 1 drop into the left eye 2 (two) times daily.  . Loteprednol Etabonate (INVELTYS) 1 % SUSP Apply 1 drop to eye 2 (two) times a day. 1 drop 2 times daily left eye (Patient taking differently: Place 1 drop into the left eye in the morning and at bedtime. )  .  bacitracin-polymyxin b (POLYSPORIN) ophthalmic ointment Place into the left eye at bedtime. Place a 1/2 inch ribbon of ointment into the lower eyelid at bedtime and as needed (Patient not taking: Reported on 06/07/2020)  . ketorolac (ACULAR) 0.5 % ophthalmic solution Place 1 drop into the left eye 3 (three) times daily. (Patient not taking: Reported on 06/07/2020)  . loteprednol (LOTEMAX) 0.5 % ophthalmic suspension Place 1 drop into the left eye 2 (two) times daily.  . Netarsudil-Latanoprost (ROCKLATAN) 0.02-0.005 % SOLN Place 1 drop into the left eye at bedtime. (Patient not taking: Reported on 06/07/2020)  . prednisoLONE acetate (PRED FORTE) 1 % ophthalmic suspension Place 1 drop into the left eye 3 (three) times daily. (Patient not taking: Reported on 06/07/2020)   No current facility-administered medications for this visit. (Ophthalmic Drugs)   Current Outpatient Medications (Other)  Medication Sig  . acetaZOLAMIDE (DIAMOX) 250 MG tablet Take 250 mg by mouth 3 (three) times daily.  Marland Kitchen amLODipine (NORVASC) 5 MG tablet Take 5 mg by mouth daily.  Marland Kitchen aspirin EC 81 MG tablet Take 81 mg by mouth daily.    Marland Kitchen atorvastatin (LIPITOR) 20 MG tablet TAKE 1 TABLET BY MOUTH DAILY (Patient taking differently: Take 20 mg by mouth daily. )  . fluticasone (FLONASE ALLERGY RELIEF) 50 MCG/ACT  nasal spray Place 2 sprays into both nostrils daily.   Marland Kitchen levothyroxine (SYNTHROID, LEVOTHROID) 150 MCG tablet Take 150 mcg by mouth See admin instructions. Alternates taking 150 mg daily for 2 days then 175 mcg the third day then repeat  . levothyroxine (SYNTHROID, LEVOTHROID) 175 MCG tablet Take 175 mcg by mouth See admin instructions. Alternates taking 150 mg daily for 2 days then 175 mcg the third day then repeat  . metFORMIN (GLUCOPHAGE) 500 MG tablet Take 500 mg by mouth daily.   . metoprolol tartrate (LOPRESSOR) 25 MG tablet Take 25 mg by mouth 2 (two) times daily.   Marland Kitchen MITIGARE 0.6 MG CAPS TcT BY MOUTH AT ONE TIME, THEN TAKE  1 CAPSULE ONE HOUR. TAKE AS NEEDED FOR FOR GOUT ATTACKS  . mometasone (ELOCON) 0.1 % ointment Apply 1 application topically daily.   . naproxen sodium (ANAPROX) 220 MG tablet Take 220 mg by mouth daily as needed (for pain.).   Marland Kitchen nitroGLYCERIN (NITROSTAT) 0.4 MG SL tablet DISSOLVE 1 TABLET UNDER THE TONGUE EVERY 5 MINUTES AS NEEDED FOR CHEST PAIN. DO NOT EXCEED A TOTAL OF 3 DOSES IN 15 MINUTES.  . tamsulosin (FLOMAX) 0.4 MG CAPS capsule Take 0.4 mg by mouth 2 (two) times daily.  Marland Kitchen telmisartan (MICARDIS) 80 MG tablet Take 1 tablet (80 mg total) by mouth daily.   No current facility-administered medications for this visit. (Other)      REVIEW OF SYSTEMS: ROS    Positive for: Endocrine, Cardiovascular, Eyes   Negative for: Constitutional, Gastrointestinal, Neurological, Skin, Genitourinary, Musculoskeletal, HENT, Respiratory, Psychiatric, Allergic/Imm, Heme/Lymph   Last edited by Matthew Folks, COA on 06/07/2020  8:40 AM. (History)       ALLERGIES Allergies  Allergen Reactions  . Azithromycin Swelling  . Shrimp [Shellfish Allergy] Other (See Comments)    Gout flares    PAST MEDICAL HISTORY Past Medical History:  Diagnosis Date  . Arthritis   . CAD (coronary artery disease)    Branch vessel and moderate RCA disease 2006  . Cancer ([place]) 1990   Melanoma Lower right Leg  . Carotid artery disease ([place])   . Cataracts, bilateral    removed by surgery  . Diabetes ([address])    type 2  . Essential hypertension   . Glaucoma   . Hyperlipidemia   . Hypothyroidism   . Lyme disease   . PAD (peripheral artery disease) ([place])    Left common iliac stent 2004  . Pneumonia    x 1  . PONV (postoperative nausea and vomiting)    blood pressure dropped  . Wears dentures   . Wears glasses    Past Surgical History:  Procedure Laterality Date  . CATARACT EXTRACTION Bilateral   . CATARACT EXTRACTION, BILATERAL    . CERVICAL DISC SURGERY     x 1  . COLONOSCOPY N/A 11/06/2016   Procedure:  COLONOSCOPY;  Surgeon: Daneil Dolin, MD;  [facility];  Service: Endoscopy;  Laterality: N/A;  7:30 AM  . COLONOSCOPY  2012   Dr. Anthony Sar: normal. reviewed reports, which states he has a history of polyps in remote past.   . EYE SURGERY    . JOINT REPLACEMENT    . LUMBAR WOUND DEBRIDEMENT N/A 01/24/2019   Procedure: LUMBAR WOUND Exploration for Evacation of Seroma vs. Hematoma;  Surgeon: Jovita Gamma, MD;  Location: [place];  Service: Neurosurgery;  Laterality: N/A;  . MELANOMA EXCISION     right leg, seen at [facility] and underwent  immunotherapy  . NECK SURGERY      x 1 yrs ago  . PARS PLANA VITRECTOMY Left 04/04/2020   Procedure: PARS PLANA VITRECTOMY WITH 25G REMOVAL/SUTURE INTRAOCULAR LENS;  Surgeon: [name], MD;  Location: [facility];  Service: Ophthalmology;  Laterality: Left;  . REPLACEMENT TOTAL KNEE Right   . WISDOM TOOTH EXTRACTION      FAMILY HISTORY Family History  Problem Relation Age of Onset  . Heart disease Mother   . Heart attack Mother   . Colon polyps Mother   . Heart attack Maternal Grandmother   . Heart attack Maternal Grandfather   . Colon polyps Brother   . Colon cancer Neg Hx     SOCIAL HISTORY Social History   Tobacco Use  . Smoking status: Former Smoker    Packs/day: 1.50    Years: 40.00    Pack years: 60.00    Types: Cigarettes    Start date: 11/04/1963    Quit date: 10/29/2003    Years since quitting: 16.6  . Smokeless tobacco: Never Used  Vaping Use  . Vaping Use: Never used  Substance Use Topics  . Alcohol use: Yes    Alcohol/week: 7.0 standard drinks    Types: 7 Standard drinks or equivalent per week    Comment: one miller lite daily   . Drug use: No         OPHTHALMIC EXAM:  Base Eye Exam    Visual Acuity (Snellen - Linear)      Right Left   Dist cc 20/20 20/40 -2   Dist ph cc  NI   Correction: Glasses       Tonometry (Tonopen, 8:42 AM)      Right Left   Pressure 13 21       Pupils      Dark Light Shape  React APD   Right 3 2 Round Brisk None   Left 3 2 Round Minimal None       Visual Fields (Counting fingers)      Left Right    Full Full       Extraocular Movement      Right Left    Full, Ortho Full, Ortho       Neuro/Psych    Oriented x3: Yes   Mood/Affect: Normal       Dilation    Both eyes: 1.0% Mydriacyl, 2.5% Phenylephrine @ 8:42 AM        Slit Lamp and Fundus Exam    External Exam      Right Left   External brow ptosis brow ptosis; mild periorbital edema and erythema       Slit Lamp Exam      Right Left   Lids/Lashes Dermatochalasis - upper lid, Telangiectasia, mild Meibomian gland dysfunction Dermatochalasis, mild Ptosis   Conjunctiva/Sclera superior and temporal Pinguecula sutures dissolving, temporal Pinguecula, Trace Injection   Cornea mild arcus, 1+ Punctate epithelial erosions mild arcus, trace Punctate epithelial erosions, 3 nylon sutures at 12, well healed superior cataract wounds   Anterior Chamber Deep and quiet, no cell/flare Deep, 0.5+pigment   Iris No NVI, Round and dilated No NVI, moderately dilated, Transillumination defects from 0100-0200   Lens Posterior chamber intraocular lens in good position Sutured Akreos sutured IOL in excellent position   Vitreous vitreous syneresis post vitrectomy, clear       Fundus Exam      Right Left   Disc Pink and Sharp Pink and Sharp, Compact   C/D  Ratio 0.2 0.2   Macula Flat, mild RPE mottling, No heme or edema Flat, blunted foveal reflex, mild RPE mottling, No heme or edema   Vessels normal, mildly Tortuous Vascular attenuation, mild Tortuousity   Periphery Attached, round focal area of CR atrophy at 0630 mid zone Attached        Refraction    Manifest Refraction      Sphere Cylinder Axis Dist VA   Right +0.25 +0.25 180 20/20   Left -0.25 Sphere  20/40-2       Manifest Refraction #2      Sphere Cylinder Axis Dist VA   Right       Left -4.25 +5.00 045 20/40-2          IMAGING AND PROCEDURES   Imaging and Procedures for @TODAY @  OCT, Retina - OU - Both Eyes       Right Eye Quality was good. Central Foveal Thickness: 277. Progression has been stable. Findings include normal foveal contour, no SRF, no IRF (partial PVD - stable).   Left Eye Quality was good. Central Foveal Thickness: 302. Progression has been stable. Findings include normal foveal contour, no IRF, no SRF.   Notes *Images captured and stored on drive  Diagnosis / Impression:  NFP, no IRF, no SRF OU Partial PVD OD stable  Clinical management:  See below  Abbreviations: NFP - Normal foveal profile. CME - cystoid macular edema. PED - pigment epithelial detachment. IRF - intraretinal fluid. SRF - subretinal fluid. EZ - ellipsoid zone. ERM - epiretinal membrane. ORA - outer retinal atrophy. ORT - outer retinal tubulation. 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 Uveitis-hyphema-glaucoma syndrome of left eye  T85.398A    H20.9    H40.42X0   3. Pigmentary glaucoma of left eye, mild stage  H40.1321   4. Dislocated IOL (intraocular lens), anterior, left  H27.122   5. Retinal edema  H35.81 OCT, Retina - OU - Both Eyes  6. Pseudophakia of both eyes  Z96.1   7. Dermatochalasis of both upper eyelids  H02.831    H02.834   8. Brow ptosis  H57.819   9. Lyme disease  A69.20    1-4. UGH Syndrome OS  - 3 piece PCIOL OS centered, but iris has large TID in sup temp quadrant with IOL haptic within area  - d/c'd Inveltys on 10/10/18 but had flare up on 11/29/18 and was restarted  - tmax at [place] and was started on max drops (Cosopt, Brim, latan, and po diamox)  - repeat gonio showed open angles OS, mild focal PAS  - now POW7 PPV w/ IOL exchange / sutured secondary Akreos OS IOL 04.08.21             - doing well  -- BCVA 20/40             - IOP remains slightly elevated at 21 -- ?residual steroid response  - pt  reports tearing and irriation and photobphobia OS since tapering steroid  - cont Cosopt BID OS -- pt reports irritation when using   Brim TID OS                         PSO prn OS   - start Lotemax BID OS             -  post op drops reviewed              - tylenol/ibuprofen for pain   - f/u as schedule, June 30th, POV, MRx/K's  5. No retinal edema on exam or OCT  6. Pseudophakia OU  - s/p CE/IOL OU -- pt can't remember dates, one eye by [facility], other eye by [place] and now sutured IOL as above  - PCIOL OD appears to be in good position  - monitor  7. Lyme disease  - history of diagnosis from target lesions on lower extremities  - no blood test confirmation performed  - completed doxycycline per PCP office  - no manifestation in the eyes, but will continue to monitor  8,9. Brow ptosis, dermatochalasis OU  - referred to Dr. Vickki Muff (now at [facility]) for eyelid eval  - holding on repair for now     Ophthalmic Meds Ordered this visit:  Meds ordered this encounter  Medications  . loteprednol (LOTEMAX) 0.5 % ophthalmic suspension    Sig: Place 1 drop into the left eye 2 (two) times daily.    Dispense:  5 mL    Refill:  1       Return for as scheduled on June 30th.  There are no Patient Instructions on file for this visit.   Explained the diagnoses, plan, and follow up with the patient and they expressed understanding.  Patient expressed understanding of the importance of proper follow up care.   This document serves as a record of services personally performed by Gardiner Sleeper, MD, PhD. It was created on their behalf by Ernest Mallick, OA, an ophthalmic assistant. The creation of this record is the provider's dictation and/or activities during the visit.    Electronically signed by: Ernest Mallick, OA 06.11.2021 12:31 AM  Gardiner Sleeper, M.D., Ph.D. Diseases & Surgery of the Retina and Vitreous [facility]  I have reviewed the  above documentation for accuracy and completeness, and I agree with the above. Gardiner Sleeper, M.D., Ph.D. 06/12/20 12:31 AM  Abbreviations: M myopia (nearsighted); A astigmatism; H hyperopia (farsighted); P presbyopia; Mrx spectacle prescription;  CTL contact lenses; OD right eye; OS left eye; OU both eyes  XT exotropia; ET esotropia; PEK punctate epithelial keratitis; PEE punctate epithelial erosions; DES dry eye syndrome; MGD meibomian gland dysfunction; ATs artificial tears; PFAT's preservative free artificial tears; [place] nuclear sclerotic cataract; PSC posterior subcapsular cataract; ERM epi-retinal membrane; PVD posterior vitreous detachment; RD retinal detachment; DM diabetes mellitus; DR diabetic retinopathy; NPDR non-proliferative diabetic retinopathy; PDR proliferative diabetic retinopathy; CSME clinically significant macular edema; DME diabetic macular edema; dbh dot blot hemorrhages; CWS cotton wool spot; POAG primary open angle glaucoma; C/D cup-to-disc ratio; HVF humphrey visual field; GVF goldmann visual field; OCT optical coherence tomography; IOP intraocular pressure; BRVO Branch retinal vein occlusion; CRVO central retinal vein occlusion; CRAO central retinal artery occlusion; BRAO branch retinal artery occlusion; RT retinal tear; SB scleral buckle; PPV pars plana vitrectomy; VH Vitreous hemorrhage; PRP panretinal laser photocoagulation; IVK intravitreal kenalog; VMT vitreomacular traction; MH Macular hole;  NVD neovascularization of the disc; NVE neovascularization elsewhere; AREDS age related eye disease study; ARMD age related macular degeneration; POAG primary open angle glaucoma; EBMD epithelial/anterior basement membrane dystrophy; ACIOL anterior chamber intraocular lens; IOL intraocular lens; PCIOL posterior chamber intraocular lens; Phaco/IOL phacoemulsification with intraocular lens placement; [place] photorefractive keratectomy; LASIK laser assisted in situ keratomileusis; HTN  hypertension; DM diabetes mellitus; COPD chronic  obstructive pulmonary disease

## 2020-06-13 ENCOUNTER — Ambulatory Visit: Payer: Medicare Other | Admitting: Orthopaedic Surgery

## 2020-06-24 NOTE — Progress Notes (Signed)
[facility] Note  06/26/2020     CHIEF COMPLAINT Patient presents for Post-op Follow-up   HISTORY OF PRESENT ILLNESS: Christopher Randolph is a 74 y.o. male who presents to the clinic today for:   HPI    Post-op Follow-up    In left eye.  Discomfort includes floaters.  Vision is stable, is blurred at distance and is blurred at near.  I, the attending physician,  performed the HPI with the patient and updated documentation appropriately.          Comments    74 y/o male pt here for 3 wk f/u.  S/p PPV w/IOL exchange/sutured secondary Akreos IOL OS 4.8.21.  UGH syndrome OS.  Pt has not noticed any change in [place] OU.  Denies pain, but c/o frequent FOL and floaters OS.  Cosopt BID OS Brimonidine TID OS Lotemax BID OS       Last edited by [name], MD on 06/26/2020  5:28 PM. (History)     Patient states vision seems slightly worse OS.    Referring physician: [name], MD [address] [place],  [place] [id number]  HISTORICAL INFORMATION:   Selected notes from the MEDICAL RECORD NUMBER Referred by Dr. [name] for concern of vitreous hemorrhage    CURRENT MEDICATIONS: Current Outpatient Medications (Ophthalmic Drugs)  Medication Sig  . brimonidine (ALPHAGAN) 0.2 % ophthalmic solution Place 1 drop into the left eye 3 (three) times daily.  Marland Kitchen loteprednol (LOTEMAX) 0.5 % ophthalmic suspension Place 1 drop into the left eye 2 (two) times daily.  . bacitracin-polymyxin b (POLYSPORIN) ophthalmic ointment Place into the left eye at bedtime. Place a 1/2 inch ribbon of ointment into the lower eyelid at bedtime and as needed (Patient not taking: Reported on 06/26/2020)  . Bromfenac Sodium (PROLENSA) 0.07 % SOLN Apply 1 drop to eye in the morning, at noon, in the evening, and at bedtime.  Marland Kitchen ketorolac (ACULAR) 0.5 % ophthalmic solution Place 1 drop into the left eye 3 (three) times daily. (Patient not taking: Reported on 06/26/2020)  .  Loteprednol Etabonate (INVELTYS) 1 % SUSP Apply 1 drop to eye 2 (two) times a day. 1 drop 2 times daily left eye (Patient not taking: Reported on 06/26/2020)  . Netarsudil-Latanoprost (ROCKLATAN) 0.02-0.005 % SOLN Place 1 drop into the left eye at bedtime.  (Patient not taking: Reported on 06/26/2020)  . prednisoLONE acetate (PRED FORTE) 1 % ophthalmic suspension Place 1 drop into the left eye 3 (three) times daily. (Patient not taking: Reported on 06/26/2020)   No current facility-administered medications for this visit. (Ophthalmic Drugs)   Current Outpatient Medications (Other)  Medication Sig  . acetaZOLAMIDE (DIAMOX) 250 MG tablet Take 250 mg by mouth 3 (three) times daily.  Marland Kitchen amLODipine (NORVASC) 5 MG tablet Take 5 mg by mouth daily.  Marland Kitchen aspirin EC 81 MG tablet Take 81 mg by mouth daily.    Marland Kitchen atorvastatin (LIPITOR) 20 MG tablet TAKE 1 TABLET BY MOUTH DAILY (Patient taking differently: Take 20 mg by mouth daily. )  . fluticasone (FLONASE ALLERGY RELIEF) 50 MCG/ACT nasal spray Place 2 sprays into both nostrils daily.   Marland Kitchen levothyroxine (SYNTHROID, LEVOTHROID) 150 MCG tablet Take 150 mcg by mouth See admin instructions. Alternates taking 150 mg daily for 2 days then 175 mcg the third day then repeat  . levothyroxine (SYNTHROID, LEVOTHROID) 175 MCG tablet Take 175 mcg by mouth See admin instructions. Alternates taking 150 mg daily  for 2 days then 175 mcg the third day then repeat  . metFORMIN (GLUCOPHAGE) 500 MG tablet Take 500 mg by mouth daily.   . metoprolol tartrate (LOPRESSOR) 25 MG tablet Take 25 mg by mouth 2 (two) times daily.   Marland Kitchen MITIGARE 0.6 MG CAPS TcT BY MOUTH AT ONE TIME, THEN TAKE 1 CAPSULE ONE HOUR. TAKE AS NEEDED FOR FOR GOUT ATTACKS  . mometasone (ELOCON) 0.1 % ointment Apply 1 application topically daily.   . naproxen sodium (ANAPROX) 220 MG tablet Take 220 mg by mouth daily as needed (for pain.).   Marland Kitchen nitroGLYCERIN (NITROSTAT) 0.4 MG SL tablet DISSOLVE 1 TABLET UNDER THE TONGUE  EVERY 5 MINUTES AS NEEDED FOR CHEST PAIN. DO NOT EXCEED A TOTAL OF 3 DOSES IN 15 MINUTES.  . tamsulosin (FLOMAX) 0.4 MG CAPS capsule Take 0.4 mg by mouth 2 (two) times daily.  Marland Kitchen telmisartan (MICARDIS) 80 MG tablet Take 1 tablet (80 mg total) by mouth daily.   No current facility-administered medications for this visit. (Other)      REVIEW OF SYSTEMS: ROS    Positive for: Musculoskeletal, Endocrine, Cardiovascular, Eyes   Negative for: Constitutional, Gastrointestinal, Neurological, Skin, Genitourinary, HENT, Respiratory, Psychiatric, Allergic/Imm, Heme/Lymph   Last edited by Matthew Folks, COA on 06/26/2020 10:21 AM. (History)       ALLERGIES Allergies  Allergen Reactions  . Azithromycin Swelling  . Shrimp [Shellfish Allergy] Other (See Comments)    Gout flares    PAST MEDICAL HISTORY Past Medical History:  Diagnosis Date  . Arthritis   . CAD (coronary artery disease)    Branch vessel and moderate RCA disease 2006  . Cancer ([place]) 1990   Melanoma Lower right Leg  . Carotid artery disease ([place])   . Cataracts, bilateral    removed by surgery  . Diabetes ([place])    type 2  . Essential hypertension   . Glaucoma   . Hyperlipidemia   . Hypothyroidism   . Lyme disease   . PAD (peripheral artery disease) ([place])    Left common iliac stent 2004  . Pneumonia    x 1  . PONV (postoperative nausea and vomiting)    blood pressure dropped  . Wears dentures   . Wears glasses    Past Surgical History:  Procedure Laterality Date  . CATARACT EXTRACTION Bilateral   . CATARACT EXTRACTION, BILATERAL    . CERVICAL DISC SURGERY     x 1  . COLONOSCOPY N/A 11/06/2016   Procedure: COLONOSCOPY;  Surgeon: Daneil Dolin, MD;  [facility];  Service: Endoscopy;  Laterality: N/A;  7:30 AM  . COLONOSCOPY  2012   Dr. Anthony Sar: normal. reviewed reports, which states he has a history of polyps in remote past.   . EYE SURGERY    . JOINT REPLACEMENT    . LUMBAR WOUND DEBRIDEMENT  N/A 01/24/2019   Procedure: LUMBAR WOUND Exploration for Evacation of Seroma vs. Hematoma;  Surgeon: Jovita Gamma, MD;  Location: [facility];  Service: Neurosurgery;  Laterality: N/A;  . MELANOMA EXCISION     right leg, seen at [facility] and underwent immunotherapy  . NECK SURGERY      x 1 yrs ago  . PARS PLANA VITRECTOMY Left 04/04/2020   Procedure: PARS PLANA VITRECTOMY WITH 25G REMOVAL/SUTURE INTRAOCULAR LENS;  Surgeon: [name], MD;  Location: [facility];  Service: Ophthalmology;  Laterality: Left;  . REPLACEMENT TOTAL KNEE Right   . WISDOM TOOTH EXTRACTION  FAMILY HISTORY Family History  Problem Relation Age of Onset  . Heart disease Mother   . Heart attack Mother   . Colon polyps Mother   . Heart attack Maternal Grandmother   . Heart attack Maternal Grandfather   . Colon polyps Brother   . Colon cancer Neg Hx     SOCIAL HISTORY Social History   Tobacco Use  . Smoking status: Former Smoker    Packs/day: 1.50    Years: 40.00    Pack years: 60.00    Types: Cigarettes    Start date: 11/04/1963    Quit date: 10/29/2003    Years since quitting: 16.6  . Smokeless tobacco: Never Used  Vaping Use  . Vaping Use: Never used  Substance Use Topics  . Alcohol use: Yes    Alcohol/week: 7.0 standard drinks    Types: 7 Standard drinks or equivalent per week    Comment: one miller lite daily   . Drug use: No         OPHTHALMIC EXAM:  Base Eye Exam    Visual Acuity (Snellen - Linear)      Right Left   Dist cc 20/20 20/100 -2   Dist ph cc  20/80 -2   Correction: Glasses       Tonometry (Tonopen, 10:24 AM)      Right Left   Pressure 18 23       Pupils      Dark Light Shape React APD   Right 3 2 Round Brisk None   Left 3 2 Round Minimal None       Visual Fields (Counting fingers)      Left Right    Full Full       Extraocular Movement      Right Left    Full, Ortho Full, Ortho       Neuro/Psych    Oriented x3: Yes   Mood/Affect: Normal       Dilation     Both eyes: 1.0% Mydriacyl, 2.5% Phenylephrine @ 10:24 AM        Slit Lamp and Fundus Exam    External Exam      Right Left   External brow ptosis brow ptosis; mild periorbital edema and erythema       Slit Lamp Exam      Right Left   Lids/Lashes Dermatochalasis - upper lid, Telangiectasia, mild Meibomian gland dysfunction Dermatochalasis, mild Ptosis   Conjunctiva/Sclera superior and temporal Pinguecula Sutures dissolving, temporal Pinguecula, Trace Injection   Cornea mild arcus, 1+ Punctate epithelial erosions mild arcus, trace Punctate epithelial erosions, 3 nylon sutures at 12, well healed superior cataract wounds   Anterior Chamber Deep and quiet, no cell/flare Deep, 0.5+ cell   Iris No NVI, Round and dilated No NVI, moderately dilated, Transillumination defects from 0100-0200   Lens Posterior chamber intraocular lens in good position Sutured Akreos sutured IOL in excellent position   Vitreous vitreous syneresis post vitrectomy, clear       Fundus Exam      Right Left   Disc Pink and Sharp Pink and Sharp, Compact   C/D Ratio 0.2 0.2   Macula Flat, mild RPE mottling, No heme or edema Flat, blunted foveal reflex, mild RPE mottling, No heme,  recurrent CME   Vessels normal, mildly Tortuous Vascular attenuation, mild Tortuousity   Periphery Attached, round focal area of CR atrophy at 0630 mid zone Attached        Refraction  Wearing Rx      Sphere Cylinder Axis   Right +0.50 Sphere    Left -0.25 +0.25 037   Type: Trifocal       Manifest Refraction      Sphere Cylinder Axis Dist VA   Right +0.25 +0.25 180 20/20   Left -2.50 +0.50 180 20/40          IMAGING AND PROCEDURES  Imaging and Procedures for @TODAY @  OCT, Retina - OU - Both Eyes       Right Eye Quality was good. Central Foveal Thickness: 272. Progression has been stable. Findings include normal foveal contour, no SRF, no IRF (partial PVD - stable).   Left Eye Quality was good. Central Foveal  Thickness: 479. Progression has worsened. Findings include intraretinal fluid, abnormal foveal contour, no SRF (Interval development of significant IRF OS).   Notes *Images captured and stored on drive  Diagnosis / Impression:  OD: NFP, no IRF, no SRF; partial PVD OS: interval development of +IRF--recurrent central CME OS  Clinical management:  See below  Abbreviations: NFP - Normal foveal profile. CME - cystoid macular edema. PED - pigment epithelial detachment. IRF - intraretinal fluid. SRF - subretinal fluid. EZ - ellipsoid zone. ERM - epiretinal membrane. ORA - outer retinal atrophy. ORT - outer retinal tubulation. 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 Uveitis-hyphema-glaucoma syndrome of left eye  T85.398A    H20.9    H40.42X0   3. Pigmentary glaucoma of left eye, mild stage  H40.1321   4. Dislocated IOL (intraocular lens), anterior, left  H27.122   5. Retinal edema  H35.81 OCT, Retina - OU - Both Eyes  6. Cystoid macular edema of left eye  H35.352 Bromfenac Sodium (PROLENSA) 0.07 % SOLN  7. Pseudophakia of both eyes  Z96.1   8. Lyme disease  A69.20   9. Brow ptosis  H57.819   10. Dermatochalasis of both upper eyelids  H02.831    H02.834    1-4. UGH Syndrome OS  - 3 piece PCIOL OS centered, but iris has large TID in sup temp quadrant with IOL haptic within area  - d/c'd Inveltys on 10/10/18 but had flare up on 11/29/18 and was restarted  - tmax at [place] and was started on max drops (Cosopt, Brim, latan, and po diamox)  - repeat gonio showed open angles OS, mild focal PAS  - now POW7 PPV w/ IOL exchange / sutured secondary Akreos OS IOL 04.08.21             - BCVA down OD to 20/80 from 20/40 -- OCT shows interval development of CME OS (despite restarting Lotemax SM last visit)             - IOP remains slightly elevated at 23 -- ?steroid response  - pt to see Dr.  [name] this week to discuss options for pressure  - cont Cosopt BID OS -- pt reports irritation when using   Brim TID OS                         PSO prn OS   - increase Lotemax to QID OS and start prolensa QID OS for CME OS -- may need STK             -  post op drops reviewed              - tylenol/ibuprofen for pain   - f/u 3-4 weeks  5,6. CME OS  - interval development since last visit  - BCVA 20/80 from 20/40 OS  - Increase lotemax to qid OS and add prolensa qid OS as above  - f/u in 3-4 wks  7. Pseudophakia OU  - s/p CE/IOL OU -- pt can't remember dates, one eye by [facility], other eye [facility]  - h/o UGH OS and now sutured IOL as above  - PCIOL OD appears to be in good position  - monitor  8. Lyme disease  - history of diagnosis from target lesions on lower extremities  - no blood test confirmation performed  - completed doxycycline per PCP office  - no manifestations in the eye, but will continue to monitor  9,10. Brow ptosis, dermatochalasis OU  - referred to Dr. Vickki Muff (now at [facility]) for eyelid eval  - holding on repair for now     Ophthalmic Meds Ordered this visit:  Meds ordered this encounter  Medications  . Bromfenac Sodium (PROLENSA) 0.07 % SOLN    Sig: Apply 1 drop to eye in the morning, at noon, in the evening, and at bedtime.    Dispense:  3 mL    Refill:  4       Return 3-4 weeks, for DFE, OCT.  There are no Patient Instructions on file for this visit.   Explained the diagnoses, plan, and follow up with the patient and they expressed understanding.  Patient expressed understanding of the importance of proper follow up care.   This document serves as a record of services personally performed by Gardiner Sleeper, MD, PhD. It was created on their behalf by Roselee Nova, COMT. The creation of this record is the provider's dictation and/or activities during the visit.  Electronically signed by: Roselee Nova, COMT 06/26/20 5:35 PM  Gardiner Sleeper, M.D., Ph.D. Diseases & Surgery of the Retina and Vitreous [facility]  I have reviewed the above documentation for accuracy and completeness, and I agree with the above. Gardiner Sleeper, M.D., Ph.D. 06/26/20 5:35 PM   Abbreviations: M myopia (nearsighted); A astigmatism; H hyperopia (farsighted); P presbyopia; Mrx spectacle prescription;  CTL contact lenses; OD right eye; OS left eye; OU both eyes  XT exotropia; ET esotropia; PEK punctate epithelial keratitis; PEE punctate epithelial erosions; DES dry eye syndrome; MGD meibomian gland dysfunction; ATs artificial tears; PFAT's preservative free artificial tears; [place] nuclear sclerotic cataract; PSC posterior subcapsular cataract; ERM epi-retinal membrane; PVD posterior vitreous detachment; RD retinal detachment; DM diabetes mellitus; DR diabetic retinopathy; NPDR non-proliferative diabetic retinopathy; PDR proliferative diabetic retinopathy; CSME clinically significant macular edema; DME diabetic macular edema; dbh dot blot hemorrhages; CWS cotton wool spot; POAG primary open angle glaucoma; C/D cup-to-disc ratio; HVF humphrey visual field; GVF goldmann visual field; OCT optical coherence tomography; IOP intraocular pressure; BRVO Branch retinal vein occlusion; CRVO central retinal vein occlusion; CRAO central retinal artery occlusion; BRAO branch retinal artery occlusion; RT retinal tear; SB scleral buckle; PPV pars plana vitrectomy; VH Vitreous hemorrhage; PRP panretinal laser photocoagulation; IVK intravitreal kenalog; VMT vitreomacular traction; MH Macular hole;  NVD neovascularization of the disc; NVE neovascularization elsewhere; AREDS age related eye disease study; ARMD age related macular degeneration; POAG primary open angle glaucoma; EBMD epithelial/anterior basement membrane dystrophy; ACIOL anterior chamber intraocular lens; IOL intraocular lens; PCIOL posterior  chamber intraocular lens; Phaco/IOL phacoemulsification  with intraocular lens placement; [place] photorefractive keratectomy; LASIK laser assisted in situ keratomileusis; HTN hypertension; DM diabetes mellitus; COPD chronic obstructive pulmonary disease

## 2020-06-26 ENCOUNTER — Ambulatory Visit (INDEPENDENT_AMBULATORY_CARE_PROVIDER_SITE_OTHER): Payer: Medicare Other | Admitting: Ophthalmology

## 2020-06-26 ENCOUNTER — Other Ambulatory Visit: Payer: Self-pay

## 2020-06-26 ENCOUNTER — Encounter (INDEPENDENT_AMBULATORY_CARE_PROVIDER_SITE_OTHER): Payer: Self-pay | Admitting: Ophthalmology

## 2020-06-26 DIAGNOSIS — H209 Unspecified iridocyclitis: Secondary | ICD-10-CM

## 2020-06-26 DIAGNOSIS — H27122 Anterior dislocation of lens, left eye: Secondary | ICD-10-CM

## 2020-06-26 DIAGNOSIS — H02831 Dermatochalasis of right upper eyelid: Secondary | ICD-10-CM

## 2020-06-26 DIAGNOSIS — H57819 Brow ptosis, unspecified: Secondary | ICD-10-CM

## 2020-06-26 DIAGNOSIS — H401321 Pigmentary glaucoma, left eye, mild stage: Secondary | ICD-10-CM

## 2020-06-26 DIAGNOSIS — H35352 Cystoid macular degeneration, left eye: Secondary | ICD-10-CM

## 2020-06-26 DIAGNOSIS — H3581 Retinal edema: Secondary | ICD-10-CM

## 2020-06-26 DIAGNOSIS — A6920 Lyme disease, unspecified: Secondary | ICD-10-CM

## 2020-06-26 DIAGNOSIS — Z961 Presence of intraocular lens: Secondary | ICD-10-CM

## 2020-06-26 DIAGNOSIS — H4042X0 Glaucoma secondary to eye inflammation, left eye, stage unspecified: Secondary | ICD-10-CM

## 2020-06-26 DIAGNOSIS — H02834 Dermatochalasis of left upper eyelid: Secondary | ICD-10-CM

## 2020-06-26 DIAGNOSIS — T85398A Other mechanical complication of other ocular prosthetic devices, implants and grafts, initial encounter: Secondary | ICD-10-CM

## 2020-06-26 MED ORDER — PROLENSA 0.07 % OP SOLN: 1.0000 [drp] | Freq: Four times a day (QID) | OPHTHALMIC | 4 refills | Status: AC

## 2020-07-15 NOTE — Progress Notes (Signed)
[facility] Note  07/17/2020     CHIEF COMPLAINT Patient presents for Retina Follow Up   HISTORY OF PRESENT ILLNESS: Christopher Randolph is a 74 y.o. male who presents to the clinic today for:   HPI    Retina Follow Up    Patient presents with  Other.  In left eye.  This started months ago.  Severity is moderate.  Duration of 3 weeks.  Since onset it is gradually improving.  I, the attending physician,  performed the HPI with the patient and updated documentation appropriately.          Comments    74 y/o male pt here for 3 wk f/u for UGH syndrome OS.  Feels VA OS has improved since last visit and since Dr. [name] "removed last stitch."  Denies pain, FOL, floaters, but has some photophobia OU.  Rocklatan QHS OS Lotemax QID OS Prolensa QID OS Cosopt BID OS Brimonidine TID OS       Last edited by [name], MD on 07/17/2020 10:42 AM. (History)     Patient states vision seems slightly worse OS.    Referring physician: [name], MD [address] [place],  [place] [address]  HISTORICAL INFORMATION:   Selected notes from the MEDICAL RECORD NUMBER Referred by Dr. [name] for concern of vitreous hemorrhage    CURRENT MEDICATIONS: Current Outpatient Medications (Ophthalmic Drugs)  Medication Sig  . brimonidine (ALPHAGAN) 0.2 % ophthalmic solution Place 1 drop into the left eye 3 (three) times daily.  . Bromfenac Sodium (PROLENSA) 0.07 % SOLN Apply 1 drop to eye in the morning, at noon, in the evening, and at bedtime.  . dorzolamide-timolol (COSOPT) 22.3-6.8 MG/ML ophthalmic solution Place 1 drop into the left eye 2 (two) times daily.  Marland Kitchen loteprednol (LOTEMAX) 0.5 % ophthalmic suspension Place 1 drop into the left eye 2 (two) times daily.  . Netarsudil-Latanoprost (ROCKLATAN) 0.02-0.005 % SOLN Place 1 drop into the left eye at bedtime.   . bacitracin-polymyxin b (POLYSPORIN) ophthalmic ointment Place into the left eye at bedtime.  Place a 1/2 inch ribbon of ointment into the lower eyelid at bedtime and as needed (Patient not taking: Reported on 07/17/2020)  . ketorolac (ACULAR) 0.5 % ophthalmic solution Place 1 drop into the left eye 3 (three) times daily. (Patient not taking: Reported on 07/17/2020)  . Loteprednol Etabonate (INVELTYS) 1 % SUSP Apply 1 drop to eye 2 (two) times a day. 1 drop 2 times daily left eye (Patient not taking: Reported on 07/17/2020)  . prednisoLONE acetate (PRED FORTE) 1 % ophthalmic suspension Place 1 drop into the left eye 3 (three) times daily. (Patient not taking: Reported on 07/17/2020)   No current facility-administered medications for this visit. (Ophthalmic Drugs)   Current Outpatient Medications (Other)  Medication Sig  . acetaZOLAMIDE (DIAMOX) 250 MG tablet Take 250 mg by mouth 3 (three) times daily.  Marland Kitchen amLODipine (NORVASC) 5 MG tablet Take 5 mg by mouth daily.  Marland Kitchen aspirin EC 81 MG tablet Take 81 mg by mouth daily.    Marland Kitchen atorvastatin (LIPITOR) 20 MG tablet TAKE 1 TABLET BY MOUTH DAILY (Patient taking differently: Take 20 mg by mouth daily. )  . fluticasone (FLONASE ALLERGY RELIEF) 50 MCG/ACT nasal spray Place 2 sprays into both nostrils daily.   Marland Kitchen levothyroxine (SYNTHROID, LEVOTHROID) 150 MCG tablet Take 150 mcg by mouth See admin instructions. Alternates taking 150 mg daily for 2 days then 175 mcg  the third day then repeat  . levothyroxine (SYNTHROID, LEVOTHROID) 175 MCG tablet Take 175 mcg by mouth See admin instructions. Alternates taking 150 mg daily for 2 days then 175 mcg the third day then repeat  . metFORMIN (GLUCOPHAGE) 500 MG tablet Take 500 mg by mouth daily.   . metoprolol tartrate (LOPRESSOR) 25 MG tablet Take 25 mg by mouth 2 (two) times daily.   Marland Kitchen MITIGARE 0.6 MG CAPS TcT BY MOUTH AT ONE TIME, THEN TAKE 1 CAPSULE ONE HOUR. TAKE AS NEEDED FOR FOR GOUT ATTACKS  . mometasone (ELOCON) 0.1 % ointment Apply 1 application topically daily.   . naproxen sodium (ANAPROX) 220 MG tablet  Take 220 mg by mouth daily as needed (for pain.).   Marland Kitchen nitroGLYCERIN (NITROSTAT) 0.4 MG SL tablet DISSOLVE 1 TABLET UNDER THE TONGUE EVERY 5 MINUTES AS NEEDED FOR CHEST PAIN. DO NOT EXCEED A TOTAL OF 3 DOSES IN 15 MINUTES.  . tamsulosin (FLOMAX) 0.4 MG CAPS capsule Take 0.4 mg by mouth 2 (two) times daily.  Marland Kitchen telmisartan (MICARDIS) 80 MG tablet Take 1 tablet (80 mg total) by mouth daily.   No current facility-administered medications for this visit. (Other)      REVIEW OF SYSTEMS: ROS    Positive for: Musculoskeletal, Cardiovascular, Eyes   Negative for: Constitutional, Gastrointestinal, Neurological, Skin, Genitourinary, HENT, Endocrine, Respiratory, Psychiatric, Allergic/Imm, Heme/Lymph   Last edited by Matthew Folks, COA on 07/17/2020 10:09 AM. (History)       ALLERGIES Allergies  Allergen Reactions  . Azithromycin Swelling  . Shrimp [Shellfish Allergy] Other (See Comments)    Gout flares    PAST MEDICAL HISTORY Past Medical History:  Diagnosis Date  . Arthritis   . CAD (coronary artery disease)    Branch vessel and moderate RCA disease 2006  . Cancer ([place]) 1990   Melanoma Lower right Leg  . Carotid artery disease ([place])   . Cataracts, bilateral    removed by surgery  . Diabetes ([place])    type 2  . Essential hypertension   . Glaucoma   . Hyperlipidemia   . Hypothyroidism   . Lyme disease   . PAD (peripheral artery disease) ([place])    Left common iliac stent 2004  . Pneumonia    x 1  . PONV (postoperative nausea and vomiting)    blood pressure dropped  . Wears dentures   . Wears glasses    Past Surgical History:  Procedure Laterality Date  . CATARACT EXTRACTION Bilateral   . CATARACT EXTRACTION, BILATERAL    . CERVICAL DISC SURGERY     x 1  . COLONOSCOPY N/A 11/06/2016   Procedure: COLONOSCOPY;  Surgeon: Daneil Dolin, MD;  [facility];  Service: Endoscopy;  Laterality: N/A;  7:30 AM  . COLONOSCOPY  2012   Dr. Anthony Sar: normal. reviewed  reports, which states he has a history of polyps in remote past.   . EYE SURGERY    . JOINT REPLACEMENT    . LUMBAR WOUND DEBRIDEMENT N/A 01/24/2019   Procedure: LUMBAR WOUND Exploration for Evacation of Seroma vs. Hematoma;  Surgeon: Jovita Gamma, MD;  Location: [facility];  Service: Neurosurgery;  Laterality: N/A;  . MELANOMA EXCISION     right leg, [facility] at [facility] and underwent immunotherapy  . NECK SURGERY      x 1 yrs ago  . PARS PLANA VITRECTOMY Left 04/04/2020   Procedure: PARS PLANA VITRECTOMY WITH 25G REMOVAL/SUTURE INTRAOCULAR LENS;  Surgeon: [name], MD;  Location: [facility];  Service: Ophthalmology;  Laterality: Left;  . REPLACEMENT TOTAL KNEE Right   . WISDOM TOOTH EXTRACTION      FAMILY HISTORY Family History  Problem Relation Age of Onset  . Heart disease Mother   . Heart attack Mother   . Colon polyps Mother   . Heart attack Maternal Grandmother   . Heart attack Maternal Grandfather   . Colon polyps Brother   . Colon cancer Neg Hx     SOCIAL HISTORY Social History   Tobacco Use  . Smoking status: Former Smoker    Packs/day: 1.50    Years: 40.00    Pack years: 60.00    Types: Cigarettes    Start date: 11/04/1963    Quit date: 10/29/2003    Years since quitting: 16.7  . Smokeless tobacco: Never Used  Vaping Use  . Vaping Use: Never used  Substance Use Topics  . Alcohol use: Yes    Alcohol/week: 7.0 standard drinks    Types: 7 Standard drinks or equivalent per week    Comment: one miller lite daily   . Drug use: No         OPHTHALMIC EXAM:  Base Eye Exam    Visual Acuity (Snellen - Linear)      Right Left   Dist cc 20/20 20/50 -2   Dist ph cc  20/40 -2   Correction: Glasses       Tonometry (Tonopen, 10:13 AM)      Right Left   Pressure 18 18       Pupils      Dark Light Shape React APD   Right 3 2 Round Brisk None   Left 3 2 Round Minimal None       Visual Fields (Counting fingers)      Left Right    Full Full       Extraocular  Movement      Right Left    Full, Ortho Full, Ortho       Neuro/Psych    Oriented x3: Yes   Mood/Affect: Normal       Dilation    Both eyes: 1.0% Mydriacyl, 2.5% Phenylephrine @ 10:14 AM        Slit Lamp and Fundus Exam    External Exam      Right Left   External brow ptosis brow ptosis; mild periorbital edema and erythema       Slit Lamp Exam      Right Left   Lids/Lashes Dermatochalasis - upper lid, Telangiectasia, mild Meibomian gland dysfunction Dermatochalasis, mild Ptosis   Conjunctiva/Sclera superior and temporal Pinguecula temporal Pinguecula, 1+ Injection, well healed superior cataract wounds--nylon sutures removed   Cornea mild arcus, 1+ Punctate epithelial erosions mild arcus, trace Punctate epithelial erosions, 3 nylon sutures at 12, well healed superior cataract wounds   Anterior Chamber Deep and quiet, no cell/flare Deep, no cell/pigment   Iris No NVI, Round and dilated No NVI, moderately dilated, Transillumination defects from 0100-0200   Lens Posterior chamber intraocular lens in good position Sutured Akreos IOL in excellent position   Vitreous vitreous syneresis post vitrectomy, clear       Fundus Exam      Right Left   Disc Pink and Sharp Pink and Sharp, Compact   C/D Ratio 0.2 0.2   Macula Flat, mild RPE mottling, No heme or edema Flat, blunted foveal reflex, mild RPE mottling, No heme, interval resolution of CME   Vessels normal, mildly  Tortuous Vascular attenuation, mild Tortuousity   Periphery Attached, round focal area of CR atrophy at 0630 mid zone Attached          IMAGING AND PROCEDURES  Imaging and Procedures for @TODAY @  OCT, Retina - OU - Both Eyes       Right Eye Quality was good. Central Foveal Thickness: 275. Progression has been stable. Findings include normal foveal contour, no SRF, no IRF (partial PVD - stable).   Left Eye Quality was good. Central Foveal Thickness: 307. Progression has improved. Findings include no SRF, normal  foveal contour, no IRF (Interval improvement in CME).   Notes *Images captured and stored on drive  Diagnosis / Impression:  OD: NFP, no IRF, no SRF; partial PVD OS: NFP; no IRF/SRF; Interval improvement in CME  Clinical management:  See below  Abbreviations: NFP - Normal foveal profile. CME - cystoid macular edema. PED - pigment epithelial detachment. IRF - intraretinal fluid. SRF - subretinal fluid. EZ - ellipsoid zone. ERM - epiretinal membrane. ORA - outer retinal atrophy. ORT - outer retinal tubulation. 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 Uveitis-hyphema-glaucoma syndrome of left eye  T85.398A    H20.9    H40.42X0   3. Pigmentary glaucoma of left eye, mild stage  H40.1321   4. Cystoid macular edema of left eye  H35.352   5. Retinal edema  H35.81 OCT, Retina - OU - Both Eyes  6. Dislocated IOL (intraocular lens), anterior, left  H27.122   7. Pseudophakia of both eyes  Z96.1   8. Lyme disease  A69.20   9. Brow ptosis  H57.819   10. Dermatochalasis of both upper eyelids  H02.831    H02.834    1-4. UGH Syndrome OS  - 3 piece PCIOL OS centered, but iris has large TID in sup temp quadrant with IOL haptic within area  - tmax at [facility] 39 OS and was started on max drops (Cosopt, Brim, latan, and po diamox)  - repeat gonio showed open angles OS, mild focal PAS  - s/p PPV w/ IOL exchange / sutured secondary Akreos OS IOL 04.08.21             - BCVA improved OS to 20/40 from 20/80 -- OCT shows interval resolution of CME OS             - IOP improved to 18 today, 7.21.21 -- Rocklatan added by Dr. Shirleen Schirmer  - continue IOP drops per Dr. [name] -- appreciate his expert care  - Cosopt BID OS -- pt reports irritation when using  - Brim TID OS  - Rocklatan QHS OS  - Begin to taper Lotemax (1 drop TID OS for a week, then 1 drop BID OS for a week, then once daily OS for a week, then  stop)  - D/C Prolensa  - f/u 4-6 weeks  5,6. CME OS  - interval resolution on Lotemax and Prolensa QID  - BCVA 20/40 from 20/80 OS  - stop Prolensa  - start Lotemax taper -- 3,2,1, stop drops daily, decrease weekly  - f/u in 4-6 weeks  7. Pseudophakia OU  - s/p CE/IOL OU -- pt can't remember dates, one eye by [facility], other eye by [facility]  - h/o UGH OS and now sutured IOL as above  -  PCIOL OD appears to be in good position  - monitor  8. Lyme disease  - history of diagnosis from target lesions on lower extremities  - no blood test confirmation performed  - completed doxycycline per PCP office  - no manifestations in the eye, but will continue to monitor  9,10. Brow ptosis, dermatochalasis OU  - referred to Dr. Vickki Muff (now at [facility]) for eyelid eval  - holding on repair for now     Ophthalmic Meds Ordered this visit:  No orders of the defined types were placed in this encounter.      Return for 4-6 weeks DFE/OCT.  There are no Patient Instructions on file for this visit.   Explained the diagnoses, plan, and follow up with the patient and they expressed understanding.  Patient expressed understanding of the importance of proper follow up care.   This document serves as a record of services personally performed by Gardiner Sleeper, MD, PhD. It was created on their behalf by Roselee Nova, COMT. The creation of this record is the provider's dictation and/or activities during the visit.  Electronically signed by: Roselee Nova, COMT 07/17/20 12:44 PM   Gardiner Sleeper, M.D., Ph.D. Diseases & Surgery of the Retina and Vitreous [facility]  I have reviewed the above documentation for accuracy and completeness, and I agree with the above. Gardiner Sleeper, M.D., Ph.D. 07/17/20 12:44 PM    Abbreviations: M myopia (nearsighted); A astigmatism; H hyperopia (farsighted); P presbyopia; Mrx spectacle prescription;  CTL contact lenses; OD right eye;  OS left eye; OU both eyes  XT exotropia; ET esotropia; PEK punctate epithelial keratitis; PEE punctate epithelial erosions; DES dry eye syndrome; MGD meibomian gland dysfunction; ATs artificial tears; PFAT's preservative free artificial tears; [place] nuclear sclerotic cataract; PSC posterior subcapsular cataract; ERM epi-retinal membrane; PVD posterior vitreous detachment; RD retinal detachment; DM diabetes mellitus; DR diabetic retinopathy; NPDR non-proliferative diabetic retinopathy; PDR proliferative diabetic retinopathy; CSME clinically significant macular edema; DME diabetic macular edema; dbh dot blot hemorrhages; CWS cotton wool spot; POAG primary open angle glaucoma; C/D cup-to-disc ratio; HVF humphrey visual field; GVF goldmann visual field; OCT optical coherence tomography; IOP intraocular pressure; BRVO Branch retinal vein occlusion; CRVO central retinal vein occlusion; CRAO central retinal artery occlusion; BRAO branch retinal artery occlusion; RT retinal tear; SB scleral buckle; PPV pars plana vitrectomy; VH Vitreous hemorrhage; PRP panretinal laser photocoagulation; IVK intravitreal kenalog; VMT vitreomacular traction; MH Macular hole;  NVD neovascularization of the disc; NVE neovascularization elsewhere; AREDS age related eye disease study; ARMD age related macular degeneration; POAG primary open angle glaucoma; EBMD epithelial/anterior basement membrane dystrophy; ACIOL anterior chamber intraocular lens; IOL intraocular lens; PCIOL posterior chamber intraocular lens; Phaco/IOL phacoemulsification with intraocular lens placement; [address] photorefractive keratectomy; LASIK laser assisted in situ keratomileusis; HTN hypertension; DM diabetes mellitus; COPD chronic obstructive pulmonary disease

## 2020-07-17 ENCOUNTER — Encounter (INDEPENDENT_AMBULATORY_CARE_PROVIDER_SITE_OTHER): Payer: Self-pay | Admitting: Ophthalmology

## 2020-07-17 ENCOUNTER — Ambulatory Visit (INDEPENDENT_AMBULATORY_CARE_PROVIDER_SITE_OTHER): Payer: Medicare Other | Admitting: Ophthalmology

## 2020-07-17 ENCOUNTER — Other Ambulatory Visit: Payer: Self-pay

## 2020-07-17 DIAGNOSIS — Z961 Presence of intraocular lens: Secondary | ICD-10-CM

## 2020-07-17 DIAGNOSIS — H4042X0 Glaucoma secondary to eye inflammation, left eye, stage unspecified: Secondary | ICD-10-CM

## 2020-07-17 DIAGNOSIS — T85398A Other mechanical complication of other ocular prosthetic devices, implants and grafts, initial encounter: Secondary | ICD-10-CM | POA: Diagnosis not present

## 2020-07-17 DIAGNOSIS — H02834 Dermatochalasis of left upper eyelid: Secondary | ICD-10-CM

## 2020-07-17 DIAGNOSIS — H3581 Retinal edema: Secondary | ICD-10-CM

## 2020-07-17 DIAGNOSIS — H35352 Cystoid macular degeneration, left eye: Secondary | ICD-10-CM

## 2020-07-17 DIAGNOSIS — H02831 Dermatochalasis of right upper eyelid: Secondary | ICD-10-CM

## 2020-07-17 DIAGNOSIS — H401321 Pigmentary glaucoma, left eye, mild stage: Secondary | ICD-10-CM | POA: Diagnosis not present

## 2020-07-17 DIAGNOSIS — H57819 Brow ptosis, unspecified: Secondary | ICD-10-CM

## 2020-07-17 DIAGNOSIS — H209 Unspecified iridocyclitis: Secondary | ICD-10-CM | POA: Diagnosis not present

## 2020-07-17 DIAGNOSIS — H27122 Anterior dislocation of lens, left eye: Secondary | ICD-10-CM

## 2020-07-17 DIAGNOSIS — A6920 Lyme disease, unspecified: Secondary | ICD-10-CM | POA: Diagnosis not present

## 2020-08-01 ENCOUNTER — Ambulatory Visit (INDEPENDENT_AMBULATORY_CARE_PROVIDER_SITE_OTHER): Payer: Medicare Other | Admitting: Cardiology

## 2020-08-01 ENCOUNTER — Encounter: Payer: Self-pay | Admitting: Cardiology

## 2020-08-01 VITALS — BP 114/60 | HR 62 | Ht 68.0 in | Wt 184.0 lb

## 2020-08-01 DIAGNOSIS — E782 Mixed hyperlipidemia: Secondary | ICD-10-CM

## 2020-08-01 DIAGNOSIS — I6523 Occlusion and stenosis of bilateral carotid arteries: Secondary | ICD-10-CM | POA: Diagnosis not present

## 2020-08-01 DIAGNOSIS — I25119 Atherosclerotic heart disease of native coronary artery with unspecified angina pectoris: Secondary | ICD-10-CM

## 2020-08-01 NOTE — Patient Instructions (Addendum)

## 2020-08-01 NOTE — Progress Notes (Signed)
Cardiology Office Note  Date: 08/01/2020   ID: Christopher Randolph, DOB 09-23-1946, MRN [id number]  PCP:  Christopher Labrum, MD  Cardiologist:  Christopher Lesches, MD Electrophysiologist:  None   Chief Complaint  Patient presents with  . Cardiac follow-up    History of Present Illness: Christopher Randolph is a 74 y.o. male last seen in February.  He presents for a routine visit.  Since last evaluation he does not report any obvious angina symptoms, no palpitations or syncope.  Still remains functional with ADLs and outdoor chores.  Carotid Dopplers from September of last year are outlined below, follow-up studies pending for September.  He has overall moderate carotid artery disease and is asymptomatic at this time.  I reviewed his medications which are outlined below and stable from a cardiac perspective.  He continues to follow with Dr. Pleas Koch for primary care.  Past Medical History:  Diagnosis Date  . Arthritis   . CAD (coronary artery disease)    Branch vessel and moderate RCA disease 2006  . Cancer ([place]) 1990   Melanoma Lower right Leg  . Carotid artery disease ([place])   . Cataracts, bilateral   . Essential hypertension   . Glaucoma   . Hyperlipidemia   . Hypothyroidism   . Lyme disease   . PAD (peripheral artery disease) ([place])    Left common iliac stent 2004  . Pneumonia    x 1  . PONV (postoperative nausea and vomiting)   . Type 2 diabetes mellitus ([place])   . Wears dentures   . Wears glasses     Past Surgical History:  Procedure Laterality Date  . CATARACT EXTRACTION Bilateral   . CATARACT EXTRACTION, BILATERAL    . CERVICAL DISC SURGERY     x 1  . COLONOSCOPY N/A 11/06/2016   Procedure: COLONOSCOPY;  Surgeon: Christopher Dolin, MD;  [facility];  Service: Endoscopy;  Laterality: N/A;  7:30 AM  . COLONOSCOPY  2012   Dr. Anthony Sar: normal. reviewed reports, which states he has a history of polyps in remote past.   . EYE SURGERY    . JOINT REPLACEMENT    .  LUMBAR WOUND DEBRIDEMENT N/A 01/24/2019   Procedure: LUMBAR WOUND Exploration for Evacation of Seroma vs. Hematoma;  Surgeon: Christopher Gamma, MD;  Location: [facility];  Service: Neurosurgery;  Laterality: N/A;  . MELANOMA EXCISION     right leg, [facility] at [facility] and underwent immunotherapy  . NECK SURGERY      x 1 yrs ago  . PARS PLANA VITRECTOMY Left 04/04/2020   Procedure: PARS PLANA VITRECTOMY WITH 25G REMOVAL/SUTURE INTRAOCULAR LENS;  Surgeon: Christopher Caffey, MD;  Location: [facility];  Service: Ophthalmology;  Laterality: Left;  . REPLACEMENT TOTAL KNEE Right   . WISDOM TOOTH EXTRACTION      Current Outpatient Medications  Medication Sig Dispense Refill  . amLODipine (NORVASC) 5 MG tablet Take 5 mg by mouth daily.    Marland Kitchen aspirin EC 81 MG tablet Take 81 mg by mouth daily.      Marland Kitchen atorvastatin (LIPITOR) 20 MG tablet TAKE 1 TABLET BY MOUTH DAILY (Patient taking differently: Take 20 mg by mouth daily. ) 90 tablet 3  . bacitracin-polymyxin b (POLYSPORIN) ophthalmic ointment Place into the left eye at bedtime. Place a 1/2 inch ribbon of ointment into the lower eyelid at bedtime and as needed 3.5 g 0  . dorzolamide-timolol (COSOPT) 22.3-6.8 MG/ML ophthalmic solution Place 1 drop into the left  eye 2 (two) times daily.    . fluticasone (FLONASE ALLERGY RELIEF) 50 MCG/ACT nasal spray Place 2 sprays into both nostrils daily.     Marland Kitchen ketorolac (ACULAR) 0.5 % ophthalmic solution Place 1 drop into the left eye 3 (three) times daily. 10 mL 0  . levothyroxine (SYNTHROID, LEVOTHROID) 150 MCG tablet Take 150 mcg by mouth See admin instructions. Alternates taking 150 mg daily for 2 days then 175 mcg the third day then repeat  1  . levothyroxine (SYNTHROID, LEVOTHROID) 175 MCG tablet Take 175 mcg by mouth See admin instructions. Alternates taking 150 mg daily for 2 days then 175 mcg the third day then repeat    . loteprednol (LOTEMAX) 0.5 % ophthalmic suspension Place 1 drop into the left eye 2 (two) times daily. 5 mL 1  .  Loteprednol Etabonate (INVELTYS) 1 % SUSP Apply 1 drop to eye 2 (two) times a day. 1 drop 2 times daily left eye 2.8 mL 10  . metFORMIN (GLUCOPHAGE) 500 MG tablet Take 500 mg by mouth daily.     . metoprolol tartrate (LOPRESSOR) 25 MG tablet Take 25 mg by mouth 2 (two) times daily.     Marland Kitchen MITIGARE 0.6 MG CAPS TcT BY MOUTH AT ONE TIME, THEN TAKE 1 CAPSULE ONE HOUR. TAKE AS NEEDED FOR FOR GOUT ATTACKS    . mometasone (ELOCON) 0.1 % ointment Apply 1 application topically daily.     . naproxen sodium (ANAPROX) 220 MG tablet Take 220 mg by mouth daily as needed (for pain.).     Marland Kitchen Netarsudil-Latanoprost (ROCKLATAN) 0.02-0.005 % SOLN Place 1 drop into the left eye at bedtime.     . nitroGLYCERIN (NITROSTAT) 0.4 MG SL tablet DISSOLVE 1 TABLET UNDER THE TONGUE EVERY 5 MINUTES AS NEEDED FOR CHEST PAIN. DO NOT EXCEED A TOTAL OF 3 DOSES IN 15 MINUTES. 25 tablet 3  . prednisoLONE acetate (PRED FORTE) 1 % ophthalmic suspension Place 1 drop into the left eye 3 (three) times daily. 15 mL 0  . tamsulosin (FLOMAX) 0.4 MG CAPS capsule Take 0.4 mg by mouth 2 (two) times daily.    Marland Kitchen telmisartan (MICARDIS) 80 MG tablet Take 1 tablet (80 mg total) by mouth daily. 90 tablet 3   No current facility-administered medications for this visit.   Allergies:  Azithromycin and Shrimp [shellfish allergy]   ROS:  No palpitations or syncope.  Physical Exam: VS:  BP 114/60   Pulse 62   Ht 5\' 8"  (1.727 m)   Wt 184 lb (83.5 kg)   SpO2 94%   BMI 27.98 kg/m , BMI Body mass index is 27.98 kg/m.  Wt Readings from Last 3 Encounters:  08/01/20 184 lb (83.5 kg)  05/16/20 175 lb (79.4 kg)  04/04/20 171 lb (77.6 kg)    General: Patient appears comfortable at rest. HEENT: Conjunctiva and lids normal, wearing a mask. Neck: Supple, no elevated JVP or carotid bruits, no thyromegaly. Lungs: Clear to auscultation, nonlabored breathing at rest. Cardiac: Regular rate and rhythm, no S3 or significant systolic murmur, no pericardial  rub. Extremities: No pitting edema, distal pulses 2+.  ECG:  An ECG dated 01/30/2020 was personally reviewed today and demonstrated:  Sinus rhythm with low voltage and nonspecific ST-T changes.  Recent Labwork: 04/04/2020: BUN 17; Creatinine, Ser 0.95; Hemoglobin 14.5; Potassium 3.8; Sodium 144   Other Studies Reviewed Today:  Echocardiogram 12/06/2017([facility]): Mild LVH with LVEF 55 to 31%, normal diastolic function, mild left atrial enlargement, normal right ventricular contraction,  no major valvular abnormalities.  Carotid Dopplers 09/06/2019: Summary:  Right Carotid: Velocities in the right ICA are consistent with a 60-79%         stenosis.   Left Carotid: Velocities in the left ICA are consistent with a 40-59%  stenosis.        High end of range, likely stable.   Vertebrals: Bilateral vertebral arteries demonstrate antegrade flow.  Subclavians: Normal flow hemodynamics were seen in bilateral subclavian        arteries.   Assessment and Plan:  1.  CAD, branch vessel and moderate RCA disease being managed medically.  He does not report any active angina and plan is to continue aspirin, Lopressor, Micardis, Norvasc, and Lipitor.  2.  Moderate bilateral carotid artery disease.  He is asymptomatic.  Due for follow-up study in September.  Continue aspirin and statin.  3.  Mixed hyperlipidemia, remains on Lipitor.  He continues to follow with Dr. Pleas Koch with goal LDL under 70.  Medication Adjustments/Labs and Tests Ordered: Current medicines are reviewed at length with the patient today.  Concerns regarding medicines are outlined above.   Tests Ordered: No orders of the defined types were placed in this encounter.   Medication Changes: No orders of the defined types were placed in this encounter.   Disposition:  Follow up 6 months in the [place] office.  Signed, Satira Sark, MD, [facility] 08/01/2020 4:45 PM    [facility]  at [address], [place], [place] [address] Phone: [phone]; Fax: [phone]

## 2020-08-14 ENCOUNTER — Ambulatory Visit (INDEPENDENT_AMBULATORY_CARE_PROVIDER_SITE_OTHER): Payer: Medicare Other | Admitting: Ophthalmology

## 2020-08-14 ENCOUNTER — Other Ambulatory Visit: Payer: Self-pay

## 2020-08-14 ENCOUNTER — Encounter (INDEPENDENT_AMBULATORY_CARE_PROVIDER_SITE_OTHER): Payer: Self-pay | Admitting: Ophthalmology

## 2020-08-14 DIAGNOSIS — H3581 Retinal edema: Secondary | ICD-10-CM

## 2020-08-14 DIAGNOSIS — H57819 Brow ptosis, unspecified: Secondary | ICD-10-CM | POA: Diagnosis not present

## 2020-08-14 DIAGNOSIS — H4042X0 Glaucoma secondary to eye inflammation, left eye, stage unspecified: Secondary | ICD-10-CM | POA: Diagnosis not present

## 2020-08-14 DIAGNOSIS — H209 Unspecified iridocyclitis: Secondary | ICD-10-CM | POA: Diagnosis not present

## 2020-08-14 DIAGNOSIS — H35352 Cystoid macular degeneration, left eye: Secondary | ICD-10-CM

## 2020-08-14 DIAGNOSIS — H401321 Pigmentary glaucoma, left eye, mild stage: Secondary | ICD-10-CM

## 2020-08-14 DIAGNOSIS — H27122 Anterior dislocation of lens, left eye: Secondary | ICD-10-CM | POA: Diagnosis not present

## 2020-08-14 DIAGNOSIS — H02831 Dermatochalasis of right upper eyelid: Secondary | ICD-10-CM

## 2020-08-14 DIAGNOSIS — H02834 Dermatochalasis of left upper eyelid: Secondary | ICD-10-CM

## 2020-08-14 DIAGNOSIS — Z961 Presence of intraocular lens: Secondary | ICD-10-CM | POA: Diagnosis not present

## 2020-08-14 DIAGNOSIS — A6920 Lyme disease, unspecified: Secondary | ICD-10-CM

## 2020-08-14 DIAGNOSIS — T85398A Other mechanical complication of other ocular prosthetic devices, implants and grafts, initial encounter: Secondary | ICD-10-CM | POA: Diagnosis not present

## 2020-08-14 MED ORDER — TRIAMCINOLONE ACETONIDE 40 MG/ML IJ SUSP FOR KALEIDOSCOPE
4.0000 mg | INTRAMUSCULAR | Status: AC | PRN
  Administered 2020-08-14: 4 mg

## 2020-08-14 MED ORDER — LOTEPREDNOL ETABONATE 0.5 % OP SUSP: 1.0000 [drp] | Freq: Four times a day (QID) | OPHTHALMIC | 1 refills | Status: DC

## 2020-08-14 MED ORDER — PROLENSA 0.07 % OP SOLN: 1.0000 [drp] | Freq: Four times a day (QID) | OPHTHALMIC | 3 refills | Status: DC

## 2020-08-14 NOTE — Progress Notes (Signed)
[facility] Note  08/14/2020     CHIEF COMPLAINT Patient presents for Eye Pain and Eye Problem   HISTORY OF PRESENT ILLNESS: Christopher Randolph is a 74 y.o. male who presents to the clinic today for:   HPI    Eye Pain      In left eye.  Characterized as aching, irritation and burning.  Occurring constantly.  Duration of 2 weeks.  Since onset it is stable.  Associated symptoms include blurred vision, redness and tearing.  I, the attending physician,  performed the HPI with the patient and updated documentation appropriately.          Comments    Pt states left eye has been uncomfortable, red, irritated, and burns--states this is an ongoing issue but that it has gotten worse over the last 2 weeks.  Patient complains of spot in center of vision that is not improving OS.        Last edited by [name], MD on 08/14/2020  1:23 PM. (History)     Patient    Referring physician: [name], MD [address] [place],  [place] [address]  HISTORICAL INFORMATION:   Selected notes from the MEDICAL RECORD NUMBER Referred by Dr. [name] for concern of vitreous hemorrhage    CURRENT MEDICATIONS: Current Outpatient Medications (Ophthalmic Drugs)  Medication Sig   bacitracin-polymyxin b (POLYSPORIN) ophthalmic ointment Place into the left eye at bedtime. Place a 1/2 inch ribbon of ointment into the lower eyelid at bedtime and as needed   Bromfenac Sodium (PROLENSA) 0.07 % SOLN Place 1 drop into the left eye 4 (four) times daily.   dorzolamide-timolol (COSOPT) 22.3-6.8 MG/ML ophthalmic solution Place 1 drop into the left eye 2 (two) times daily.   ketorolac (ACULAR) 0.5 % ophthalmic solution Place 1 drop into the left eye 3 (three) times daily.   loteprednol (LOTEMAX) 0.5 % ophthalmic suspension Place 1 drop into the left eye 4 (four) times daily.   Loteprednol Etabonate (INVELTYS) 1 % SUSP Apply 1 drop to eye 2 (two) times a day. 1 drop  2 times daily left eye   Netarsudil-Latanoprost (ROCKLATAN) 0.02-0.005 % SOLN Place 1 drop into the left eye at bedtime.    prednisoLONE acetate (PRED FORTE) 1 % ophthalmic suspension Place 1 drop into the left eye 3 (three) times daily.   No current facility-administered medications for this visit. (Ophthalmic Drugs)   Current Outpatient Medications (Other)  Medication Sig   amLODipine (NORVASC) 5 MG tablet Take 5 mg by mouth daily.   aspirin EC 81 MG tablet Take 81 mg by mouth daily.     atorvastatin (LIPITOR) 20 MG tablet TAKE 1 TABLET BY MOUTH DAILY (Patient taking differently: Take 20 mg by mouth daily. )   fluticasone (FLONASE ALLERGY RELIEF) 50 MCG/ACT nasal spray Place 2 sprays into both nostrils daily.    levothyroxine (SYNTHROID, LEVOTHROID) 150 MCG tablet Take 150 mcg by mouth See admin instructions. Alternates taking 150 mg daily for 2 days then 175 mcg the third day then repeat   levothyroxine (SYNTHROID, LEVOTHROID) 175 MCG tablet Take 175 mcg by mouth See admin instructions. Alternates taking 150 mg daily for 2 days then 175 mcg the third day then repeat   metFORMIN (GLUCOPHAGE) 500 MG tablet Take 500 mg by mouth daily.    metoprolol tartrate (LOPRESSOR) 25 MG tablet Take 25 mg by mouth 2 (two) times daily.    MITIGARE 0.6 MG CAPS TcT  BY MOUTH AT ONE TIME, THEN TAKE 1 CAPSULE ONE HOUR. TAKE AS NEEDED FOR FOR GOUT ATTACKS   mometasone (ELOCON) 0.1 % ointment Apply 1 application topically daily.    naproxen sodium (ANAPROX) 220 MG tablet Take 220 mg by mouth daily as needed (for pain.).    nitroGLYCERIN (NITROSTAT) 0.4 MG SL tablet DISSOLVE 1 TABLET UNDER THE TONGUE EVERY 5 MINUTES AS NEEDED FOR CHEST PAIN. DO NOT EXCEED A TOTAL OF 3 DOSES IN 15 MINUTES.   tamsulosin (FLOMAX) 0.4 MG CAPS capsule Take 0.4 mg by mouth 2 (two) times daily.   telmisartan (MICARDIS) 80 MG tablet Take 1 tablet (80 mg total) by mouth daily.   No current facility-administered medications  for this visit. (Other)      REVIEW OF SYSTEMS: ROS    Positive for: Musculoskeletal, Cardiovascular, Eyes   Negative for: Constitutional, Gastrointestinal, Neurological, Skin, Genitourinary, HENT, Endocrine, Respiratory, Psychiatric, Allergic/Imm, Heme/Lymph   Last edited by Doneen Poisson on 08/14/2020  1:03 PM. (History)       ALLERGIES Allergies  Allergen Reactions   Azithromycin Swelling   Shrimp [Shellfish Allergy] Other (See Comments)    Gout flares    PAST MEDICAL HISTORY Past Medical History:  Diagnosis Date   Arthritis    CAD (coronary artery disease)    Branch vessel and moderate RCA disease 2006   Cancer ([place]) 1990   Melanoma Lower right Leg   Carotid artery disease ([place])    Cataracts, bilateral    Essential hypertension    Glaucoma    Hyperlipidemia    Hypothyroidism    Lyme disease    PAD (peripheral artery disease) ([place])    Left common iliac stent 2004   Pneumonia    x 1   PONV (postoperative nausea and vomiting)    Type 2 diabetes mellitus ([place])    Wears dentures    Wears glasses    Past Surgical History:  Procedure Laterality Date   CATARACT EXTRACTION Bilateral    CATARACT EXTRACTION, BILATERAL     CERVICAL DISC SURGERY     x 1   COLONOSCOPY N/A 11/06/2016   Procedure: COLONOSCOPY;  Surgeon: Daneil Dolin, MD;  [facility];  Service: Endoscopy;  Laterality: N/A;  7:30 AM   COLONOSCOPY  2012   Dr. Anthony Sar: normal. reviewed reports, which states he has a history of polyps in remote past.    EYE SURGERY     JOINT REPLACEMENT     LUMBAR WOUND DEBRIDEMENT N/A 01/24/2019   Procedure: LUMBAR WOUND Exploration for Evacation of Seroma vs. Hematoma;  Surgeon: Jovita Gamma, MD;  Location: [facility] [place];  Service: Neurosurgery;  Laterality: N/A;   MELANOMA EXCISION     right leg, seen at [facility] and underwent immunotherapy   NECK SURGERY      x 1 yrs ago   PARS PLANA VITRECTOMY Left 04/04/2020   Procedure: PARS  PLANA VITRECTOMY WITH 25G REMOVAL/SUTURE INTRAOCULAR LENS;  Surgeon: [name], MD;  Location: [place];  Service: Ophthalmology;  Laterality: Left;   REPLACEMENT TOTAL KNEE Right    WISDOM TOOTH EXTRACTION      FAMILY HISTORY Family History  Problem Relation Age of Onset   Heart disease Mother    Heart attack Mother    Colon polyps Mother    Heart attack Maternal Grandmother    Heart attack Maternal Grandfather    Colon polyps Brother    Colon cancer Neg Hx     SOCIAL HISTORY Social  History   Tobacco Use   Smoking status: Former Smoker    Packs/day: 1.50    Years: 40.00    Pack years: 60.00    Types: Cigarettes    Start date: 11/04/1963    Quit date: 10/29/2003    Years since quitting: 16.8   Smokeless tobacco: Never Used  Vaping Use   Vaping Use: Never used  Substance Use Topics   Alcohol use: Yes    Alcohol/week: 7.0 standard drinks    Types: 7 Standard drinks or equivalent per week    Comment: one miller lite daily    Drug use: No         OPHTHALMIC EXAM:  Base Eye Exam    Visual Acuity (Snellen - Linear)      Right Left   Dist cc 20/20 20/150 -1   Dist ph cc  20/70 -2   Correction: Glasses       Tonometry (Tonopen, 1:08 PM)      Right Left   Pressure 14 16       Pupils      Dark Light Shape React APD   Right 3 2 Round Brisk 0   Left 3 2 Round Brisk 0       Visual Fields      Left Right    Full Full       Extraocular Movement      Right Left    Full Full       Neuro/Psych    Oriented x3: Yes   Mood/Affect: Normal       Dilation    Both eyes: 1.0% Mydriacyl, 2.5% Phenylephrine @ 1:08 PM        Slit Lamp and Fundus Exam    External Exam      Right Left   External brow ptosis brow ptosis; mild periorbital edema and erythema       Slit Lamp Exam      Right Left   Lids/Lashes Dermatochalasis - upper lid, Telangiectasia, mild Meibomian gland dysfunction Dermatochalasis, mild Ptosis, Telangiectasia, mild errythema     Conjunctiva/Sclera superior and temporal Pinguecula temporal Pinguecula, 2+ Injection, well healed superior cataract wounds--nylon sutures removed   Cornea mild arcus, 1+ Punctate epithelial erosions mild arcus, trace Punctate epithelial erosions, well healed superior cataract wounds   Anterior Chamber Deep and quiet, no cell/flare Deep, 0.5+cell/pigment   Iris No NVI, Round and dilated No NVI, moderately dilated to 4.52mm, Transillumination defects from 0100-0200   Lens Posterior chamber intraocular lens in good position Sutured Akreos IOL in excellent position   Vitreous vitreous syneresis post vitrectomy, clear       Fundus Exam      Right Left   Disc Pink and Sharp Pink and Sharp, Compact   C/D Ratio 0.2 0.2   Macula Flat, mild RPE mottling, No heme or edema blunted foveal reflex, central CME, mild RPE mottling, No heme   Vessels normal, mildly Tortuous Vascular attenuation, mild Tortuousity   Periphery Attached, round focal area of CR atrophy at 0630 mid zone Attached        Refraction    Wearing Rx      Sphere Cylinder Axis   Right +0.50 Sphere    Left -0.25 +0.25 037   Type: Trifocal          IMAGING AND PROCEDURES  Imaging and Procedures for @TODAY @  OCT, Retina - OU - Both Eyes       Right Eye Quality was good.  Central Foveal Thickness: 270. Progression has been stable. Findings include normal foveal contour, no SRF, no IRF (partial PVD - stable).   Left Eye Quality was good. Central Foveal Thickness: 677. Progression has worsened. Findings include abnormal foveal contour, intraretinal fluid, subretinal fluid (Interval re-development of CME).   Notes *Images captured and stored on drive  Diagnosis / Impression:  OD: NFP, no IRF, no SRF; partial PVD OS: Interval re-development of CME  Clinical management:  See below  Abbreviations: NFP - Normal foveal profile. CME - cystoid macular edema. PED - pigment epithelial detachment. IRF - intraretinal fluid. SRF  - subretinal fluid. EZ - ellipsoid zone. ERM - epiretinal membrane. ORA - outer retinal atrophy. ORT - outer retinal tubulation. SRHM - subretinal hyper-reflective material        Injection into Tenon's Capsule - OS - Left Eye       Time Out 08/14/2020. 1:34 PM. Confirmed correct patient, procedure, site, and patient consented.   Anesthesia Topical anesthesia was used. Anesthetic medications included Lidocaine 2%, Proparacaine 0.5%.   Procedure Preparation included 5% betadine to ocular surface, eyelid speculum. A 27 gauge needle was used.   Injection:  4 mg KENALOG-40mg /ml injection 4mg    NDC: 1308-6578-46, Lot: [id number], Expiration date: 01/28/2022   Route: Other, Site: Left Eye  Post-op Post injection exam found visual acuity of at least counting fingers. The patient tolerated the procedure well. There were no complications. The patient received written and verbal post procedure care education.   Notes 0.95 cc of Kenalog-40 (38 mg) injected into subtenon's capsule in the superotemporal quadrant. Betadine was applied to Injection area pre and post-injection then rinsed with sterile BSS. 1 drop of polymixin was instilled into the eye. There were no complications. Pt tolerated procedure well.                 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 Uveitis-hyphema-glaucoma syndrome of left eye  T85.398A    H20.9    H40.42X0   3. Pigmentary glaucoma of left eye, mild stage  H40.1321   4. Cystoid macular edema of left eye  H35.352 Injection into Tenon's Capsule - OS - Left Eye    triamcinolone acetonide (KENALOG-40) injection 4 mg  5. Retinal edema  H35.81 OCT, Retina - OU - Both Eyes  6. Dislocated IOL (intraocular lens), anterior, left  H27.122   7. Brow ptosis  H57.819   8. Lyme disease  A69.20   9. Dermatochalasis of both upper eyelids  H02.831    H02.834   10. Pseudophakia of both eyes  Z96.1    1-4. UGH Syndrome OS  - 3 piece PCIOL OS  centered, but iris has large TID in sup temp quadrant with IOL haptic within area  - tmax at [facility] 39 OS and was started on max drops (Cosopt, Brim, latan, and po diamox)  - repeat gonio showed open angles OS, mild focal PAS  - s/p PPV w/ IOL exchange / sutured secondary Akreos OS IOL 04.08.21             - BCVA decreased OS to 20/70 from 20/40 -- OCT shows interval re-development of CME OS             - IOP good at 16 today, 08.18.21 -- Rocklatan added by Dr. Shirleen Schirmer  - continue IOP drops per Dr. [name] -- appreciate his expert care  - Cosopt BID OS -- pt reports  irritation when using  - Brim TID OS  - Rocklatan QHS OS  - f/u as scheduled  5,6. CME OS  - mild CME that was improved on 7.21.21 -- stopped Prolensa and started Lotemax taper  - today, 8.18.21, interval re-development of CME -- worse than prior  - recommend STK OS today, 08.18.21 + restarting Lotemax and Prolensa  - pt wishes to proceed  - RBA of procedure discussed, questions answered  - informed consent obtained and signed  - see procedure note  - BCVA 20/70 from 20/40 OS  - re-start Prolensa QID OS  - re-start Lotemax taper QID OS  - f/u as scheduled, IOP check  7. Pseudophakia OU  - s/p CE/IOL OU -- pt can't remember dates, one eye by [facility], [facility] eye by [facility]  - h/o UGH OS and now sutured IOL as above  - PCIOL OD appears to be in good position  - monitor  8. Lyme disease  - history of diagnosis from target lesions on lower extremities  - no blood test confirmation performed  - completed doxycycline per PCP office  - no manifestations in the eye, but will continue to monitor  9,10. Brow ptosis, dermatochalasis OU  - referred to Dr. Vickki Muff (now at [facility]) for eyelid eval  - holding on repair for now     Ophthalmic Meds Ordered this visit:  Meds ordered this encounter  Medications   Bromfenac Sodium (PROLENSA) 0.07 % SOLN    Sig: Place 1 drop into the left eye 4 (four) times daily.     Dispense:  6 mL    Refill:  3   loteprednol (LOTEMAX) 0.5 % ophthalmic suspension    Sig: Place 1 drop into the left eye 4 (four) times daily.    Dispense:  15 mL    Refill:  1   triamcinolone acetonide (KENALOG-40) injection 4 mg       Return for f/u as scheduled in September.  There are no Patient Instructions on file for this visit.  This document serves as a record of services personally performed by Gardiner Sleeper, MD, PhD. It was created on their behalf by Leeann Must, [place], an ophthalmic technician. The creation of this record is the provider's dictation and/or activities during the visit.    Electronically signed by: Leeann Must, [place] 08.18.2021 4:12 PM   This document serves as a record of services personally performed by Gardiner Sleeper, MD, PhD. It was created on their behalf by San Jetty. [place], OA an ophthalmic technician. The creation of this record is the provider's dictation and/or activities during the visit.    Electronically signed by: San Jetty. [place], [place] 08.18.201 4:12 PM    Gardiner Sleeper, M.D., Ph.D. Diseases & Surgery of the Retina and Vitreous [facility]  I have reviewed the above documentation for accuracy and completeness, and I agree with the above. Gardiner Sleeper, M.D., Ph.D. 08/14/20 4:12 PM   Abbreviations: M myopia (nearsighted); A astigmatism; H hyperopia (farsighted); P presbyopia; Mrx spectacle prescription;  CTL contact lenses; OD right eye; OS left eye; OU both eyes  XT exotropia; ET esotropia; PEK punctate epithelial keratitis; PEE punctate epithelial erosions; DES dry eye syndrome; MGD meibomian gland dysfunction; ATs artificial tears; PFAT's preservative free artificial tears; [place] nuclear sclerotic cataract; PSC posterior subcapsular cataract; ERM epi-retinal membrane; PVD posterior vitreous detachment; RD retinal detachment; DM diabetes mellitus; DR diabetic retinopathy; NPDR non-proliferative diabetic  retinopathy; PDR proliferative  diabetic retinopathy; CSME clinically significant macular edema; DME diabetic macular edema; dbh dot blot hemorrhages; CWS cotton wool spot; POAG primary open angle glaucoma; C/D cup-to-disc ratio; HVF humphrey visual field; GVF goldmann visual field; OCT optical coherence tomography; IOP intraocular pressure; BRVO Branch retinal vein occlusion; CRVO central retinal vein occlusion; CRAO central retinal artery occlusion; BRAO branch retinal artery occlusion; RT retinal tear; SB scleral buckle; PPV pars plana vitrectomy; VH Vitreous hemorrhage; PRP panretinal laser photocoagulation; IVK intravitreal kenalog; VMT vitreomacular traction; MH Macular hole;  NVD neovascularization of the disc; NVE neovascularization elsewhere; AREDS age related eye disease study; ARMD age related macular degeneration; POAG primary open angle glaucoma; EBMD epithelial/anterior basement membrane dystrophy; ACIOL anterior chamber intraocular lens; IOL intraocular lens; PCIOL posterior chamber intraocular lens; Phaco/IOL phacoemulsification with intraocular lens placement; [place] photorefractive keratectomy; LASIK laser assisted in situ keratomileusis; HTN hypertension; DM diabetes mellitus; COPD chronic obstructive pulmonary disease

## 2020-08-27 NOTE — Progress Notes (Signed)
[facility] Note  08/28/2020     CHIEF COMPLAINT Patient presents for Retina Follow Up   HISTORY OF PRESENT ILLNESS: Christopher Randolph is a 74 y.o. male who presents to the clinic today for:   HPI    Retina Follow Up    Patient presents with  Other.  In left eye.  This started months ago.  Severity is moderate.  Duration of 2 weeks.  Since onset it is gradually improving.  I, the attending physician,  performed the HPI with the patient and updated documentation appropriately.          Comments    74 y/o male pt here for 2 wk f/u for UGH Syndrome OS.  Feels [place] OS may be improving slightly.  No change in [place] OD.  Denies pain, FOL.  Has a few floaters OU.  Brimondine TID OS Cosopt BID OS Rocklatan QHS OS Prolensa QID OS Not currently using Lotemax b/c he ran out       Last edited by [name], MD on 08/29/2020 12:37 PM. (History)    Patient states he feels like his vision is better, he states he ran out of [place],    Referring physician: Myrtha Mantis, [place] [address]  HISTORICAL INFORMATION:   Selected notes from the MEDICAL RECORD NUMBER Referred by Dr. [name] for concern of vitreous hemorrhage    CURRENT MEDICATIONS: Current Outpatient Medications (Ophthalmic Drugs)  Medication Sig  . Bromfenac Sodium (PROLENSA) 0.07 % SOLN Place 1 drop into the left eye 4 (four) times daily.  . dorzolamide-timolol (COSOPT) 22.3-6.8 MG/ML ophthalmic solution Place 1 drop into the left eye 2 (two) times daily.  . Loteprednol Etabonate (INVELTYS) 1 % SUSP Apply 1 drop to eye 2 (two) times a day. 1 drop 2 times daily left eye  . Netarsudil-Latanoprost (ROCKLATAN) 0.02-0.005 % SOLN Place 1 drop into the left eye at bedtime.   . bacitracin-polymyxin b (POLYSPORIN) ophthalmic ointment Place into the left eye at bedtime. Place a 1/2 inch ribbon of ointment into the lower eyelid at bedtime and as needed (Patient not taking:  Reported on 08/28/2020)  . ketorolac (ACULAR) 0.5 % ophthalmic solution Place 1 drop into the left eye 3 (three) times daily. (Patient not taking: Reported on 08/28/2020)  . loteprednol (LOTEMAX) 0.5 % ophthalmic suspension Place 1 drop into the left eye 4 (four) times daily. (Patient not taking: Reported on 08/28/2020)  . Loteprednol Etabonate (LOTEMAX SM) 0.38 % GEL Place 1 drop into the left eye 4 (four) times daily.  . prednisoLONE acetate (PRED FORTE) 1 % ophthalmic suspension Place 1 drop into the left eye 3 (three) times daily. (Patient not taking: Reported on 08/28/2020)   No current facility-administered medications for this visit. (Ophthalmic Drugs)   Current Outpatient Medications (Other)  Medication Sig  . amLODipine (NORVASC) 5 MG tablet Take 5 mg by mouth daily.  Marland Kitchen aspirin EC 81 MG tablet Take 81 mg by mouth daily.    Marland Kitchen atorvastatin (LIPITOR) 20 MG tablet TAKE 1 TABLET BY MOUTH DAILY (Patient taking differently: Take 20 mg by mouth daily. )  . fluticasone (FLONASE ALLERGY RELIEF) 50 MCG/ACT nasal spray Place 2 sprays into both nostrils daily.   Marland Kitchen levothyroxine (SYNTHROID, LEVOTHROID) 150 MCG tablet Take 150 mcg by mouth See admin instructions. Alternates taking 150 mg daily for 2 days then 175 mcg the third day then repeat  . levothyroxine (SYNTHROID, LEVOTHROID) 175 MCG  tablet Take 175 mcg by mouth See admin instructions. Alternates taking 150 mg daily for 2 days then 175 mcg the third day then repeat  . metFORMIN (GLUCOPHAGE) 500 MG tablet Take 500 mg by mouth daily.   . metoprolol tartrate (LOPRESSOR) 25 MG tablet Take 25 mg by mouth 2 (two) times daily.   Marland Kitchen MITIGARE 0.6 MG CAPS TcT BY MOUTH AT ONE TIME, THEN TAKE 1 CAPSULE ONE HOUR. TAKE AS NEEDED FOR FOR GOUT ATTACKS  . mometasone (ELOCON) 0.1 % ointment Apply 1 application topically daily.   . naproxen sodium (ANAPROX) 220 MG tablet Take 220 mg by mouth daily as needed (for pain.).   Marland Kitchen nitroGLYCERIN (NITROSTAT) 0.4 MG SL tablet  DISSOLVE 1 TABLET UNDER THE TONGUE EVERY 5 MINUTES AS NEEDED FOR CHEST PAIN. DO NOT EXCEED A TOTAL OF 3 DOSES IN 15 MINUTES.  . tamsulosin (FLOMAX) 0.4 MG CAPS capsule Take 0.4 mg by mouth 2 (two) times daily.  Marland Kitchen telmisartan (MICARDIS) 80 MG tablet Take 1 tablet (80 mg total) by mouth daily.   No current facility-administered medications for this visit. (Other)      REVIEW OF SYSTEMS: ROS    Positive for: Musculoskeletal, Cardiovascular, Eyes   Negative for: Constitutional, Gastrointestinal, Neurological, Skin, Genitourinary, HENT, Endocrine, Respiratory, Psychiatric, Allergic/Imm, Heme/Lymph   Last edited by Matthew Folks, COA on 08/28/2020  2:11 PM. (History)       ALLERGIES Allergies  Allergen Reactions  . Azithromycin Swelling  . Shrimp [Shellfish Allergy] Other (See Comments)    Gout flares    PAST MEDICAL HISTORY Past Medical History:  Diagnosis Date  . Arthritis   . CAD (coronary artery disease)    Branch vessel and moderate RCA disease 2006  . Cancer ([place]) 1990   Melanoma Lower right Leg  . Carotid artery disease ([place])   . Cataracts, bilateral   . Essential hypertension   . Glaucoma   . Hyperlipidemia   . Hypothyroidism   . Lyme disease   . PAD (peripheral artery disease) ([place])    Left common iliac stent 2004  . Pneumonia    x 1  . PONV (postoperative nausea and vomiting)   . Type 2 diabetes mellitus ([place])   . Wears dentures   . Wears glasses    Past Surgical History:  Procedure Laterality Date  . CATARACT EXTRACTION Bilateral   . CATARACT EXTRACTION, BILATERAL    . CERVICAL DISC SURGERY     x 1  . COLONOSCOPY N/A 11/06/2016   Procedure: COLONOSCOPY;  Surgeon: Daneil Dolin, MD;  [facility];  Service: Endoscopy;  Laterality: N/A;  7:30 AM  . COLONOSCOPY  2012   Dr. Anthony Sar: normal. reviewed reports, which states he has a history of polyps in remote past.   . EYE SURGERY    . JOINT REPLACEMENT    . LUMBAR WOUND DEBRIDEMENT N/A  01/24/2019   Procedure: LUMBAR WOUND Exploration for Evacation of Seroma vs. Hematoma;  Surgeon: Jovita Gamma, MD;  Location: [place];  Service: Neurosurgery;  Laterality: N/A;  . MELANOMA EXCISION     right leg, seen at [facility] and underwent immunotherapy  . NECK SURGERY      x 1 yrs ago  . PARS PLANA VITRECTOMY Left 04/04/2020   Procedure: PARS PLANA VITRECTOMY WITH 25G REMOVAL/SUTURE INTRAOCULAR LENS;  Surgeon: [name], MD;  Location: [place];  Service: Ophthalmology;  Laterality: Left;  . REPLACEMENT TOTAL KNEE Right   . WISDOM TOOTH EXTRACTION  FAMILY HISTORY Family History  Problem Relation Age of Onset  . Heart disease Mother   . Heart attack Mother   . Colon polyps Mother   . Heart attack Maternal Grandmother   . Heart attack Maternal Grandfather   . Colon polyps Brother   . Colon cancer Neg Hx     SOCIAL HISTORY Social History   Tobacco Use  . Smoking status: Former Smoker    Packs/day: 1.50    Years: 40.00    Pack years: 60.00    Types: Cigarettes    Start date: 11/04/1963    Quit date: 10/29/2003    Years since quitting: 16.8  . Smokeless tobacco: Never Used  Vaping Use  . Vaping Use: Never used  Substance Use Topics  . Alcohol use: Yes    Alcohol/week: 7.0 standard drinks    Types: 7 Standard drinks or equivalent per week    Comment: one miller lite daily   . Drug use: No         OPHTHALMIC EXAM:  Base Eye Exam    Visual Acuity (Snellen - Linear)      Right Left   Dist cc 20/15 - 20/150   Dist ph cc  20/60 -2   Correction: Glasses       Tonometry (Tonopen, 2:15 PM)      Right Left   Pressure 19 18       Pupils      Dark Light Shape React APD   Right 3 2 Round Brisk None   Left 3 2 Round Brisk None       Visual Fields (Counting fingers)      Left Right     Full       Extraocular Movement      Right Left    Full, Ortho Full, Ortho       Neuro/Psych    Oriented x3: Yes   Mood/Affect: Normal       Dilation    Both eyes:  1.0% Mydriacyl, 2.5% Phenylephrine @ 2:15 PM        Slit Lamp and Fundus Exam    External Exam      Right Left   External brow ptosis brow ptosis; mild periorbital edema and erythema       Slit Lamp Exam      Right Left   Lids/Lashes Dermatochalasis - upper lid, Telangiectasia, mild Meibomian gland dysfunction Dermatochalasis, mild Ptosis, Telangiectasia, mild errythema    Conjunctiva/Sclera superior and temporal Pinguecula temporal Pinguecula, 2+ Injection, STK ST quad   Cornea mild arcus, 1+ Punctate epithelial erosions mild arcus, trace Punctate epithelial erosions, well healed superior cataract wound   Anterior Chamber Deep and quiet, no cell/flare Deep, 0.5+cell/pigment   Iris No NVI, Round and dilated No NVI, moderately dilated to 4.72mm, Transillumination defects from 0100-0200   Lens Posterior chamber intraocular lens in good position Sutured Akreos IOL in excellent position   Vitreous vitreous syneresis post vitrectomy, clear       Fundus Exam      Right Left   Disc Pink and Sharp Pink and Sharp, Compact   C/D Ratio 0.2 0.2   Macula Flat, mild RPE mottling, No heme or edema Flat. blunted foveal reflex, central CME -- improved/essentially resolved, mild RPE mottling, No heme   Vessels normal, mildly Tortuous Vascular attenuation, mild Tortuousity   Periphery Attached, round focal area of CR atrophy at 0630 mid zone Attached  IMAGING AND PROCEDURES  Imaging and Procedures for @TODAY @  OCT, Retina - OU - Both Eyes       Right Eye Quality was good. Central Foveal Thickness: 276. Progression has been stable. Findings include normal foveal contour, no SRF, no IRF (partial PVD - stable).   Left Eye Quality was good. Central Foveal Thickness: 333. Progression has improved. Findings include normal foveal contour, no IRF, no SRF (Interval improvement in CME and foveal profile; trace residual cystic changes).   Notes *Images captured and stored on  drive  Diagnosis / Impression:  OD: NFP, no IRF, no SRF; partial PVD OS: Interval improvement in CME and foveal profile; trace residual cystic changes  Clinical management:  See below  Abbreviations: NFP - Normal foveal profile. CME - cystoid macular edema. PED - pigment epithelial detachment. IRF - intraretinal fluid. SRF - subretinal fluid. EZ - ellipsoid zone. ERM - epiretinal membrane. ORA - outer retinal atrophy. ORT - outer retinal tubulation. SRHM - subretinal hyper-reflective material        VAS US CAROTID       Carotid Arterial Duplex Study  Indications:       Carotid artery disease. Risk Factors:      Hypertension, hyperlipidemia, past history of smoking,                    coronary artery disease, PAD. Comparison Study:  On 09/06/19 carotid Duplex showed 60-79% RICA stenosis and                    32-44% LICA stenosis. The right peak systolic velocity was                    218 cm/s and end diastolic velocity was 72 cm/s. The left                    peak systolic velocity was 010 cm/s and end diastolic                    velocity was 59 cm/s.  Performing Technologist: Jeneen Montgomery RDMS, RVT, RDCS    Examination Guidelines: A complete evaluation includes B-mode imaging, spectral Doppler, color Doppler, and power Doppler as needed of all accessible portions of each vessel. Bilateral testing is considered an integral part of a complete examination. Limited examinations for reoccurring indications may be performed as noted.    Right Carotid Findings: +----------+--------+--------+--------+-------------------------+---------+           PSV cm/sEDV cm/sStenosisPlaque Description       Comments  +----------+--------+--------+--------+-------------------------+---------+ CCA Prox  204     26                                                 +----------+--------+--------+--------+-------------------------+---------+ CCA Distal51      14                                                  +----------+--------+--------+--------+-------------------------+---------+ ICA Prox  251     86      60-79%  calcific and heterogenousShadowing +----------+--------+--------+--------+-------------------------+---------+ ICA Mid   229     64                                                 +----------+--------+--------+--------+-------------------------+---------+  ICA Distal122     27                                                 +----------+--------+--------+--------+-------------------------+---------+ ECA       108     8                                                  +----------+--------+--------+--------+-------------------------+---------+  +----------+--------+-------+--------+-------------------+           PSV cm/sEDV cmsDescribeArm Pressure (mmHG) +----------+--------+-------+--------+-------------------+ Subclavian187     0      Stenotic114                 +----------+--------+-------+--------+-------------------+  +---------+--------+--+--------+--+ VertebralPSV cm/s38EDV cm/s14 +---------+--------+--+--------+--+     Left Carotid Findings: +----------+-------+--------+--------+-----------------------+-----------------+           PSV    EDV cm/sStenosisPlaque Description     Comments                    cm/s                                                            +----------+-------+--------+--------+-----------------------+-----------------+ CCA Prox  123    17                                                       +----------+-------+--------+--------+-----------------------+-----------------+ CCA Distal98     21                                                       +----------+-------+--------+--------+-----------------------+-----------------+ ICA Prox  191    60              calcific and           Shadowing                                           heterogenous                             +----------+-------+--------+--------+-----------------------+-----------------+ ICA Mid   238    62      40-59%  heterogenous           upper end of  scale             +----------+-------+--------+--------+-----------------------+-----------------+ ICA Distal189    50                                                       +----------+-------+--------+--------+-----------------------+-----------------+ ECA       136    12                                                       +----------+-------+--------+--------+-----------------------+-----------------+  +----------+--------+--------+----------------+-------------------+           PSV cm/sEDV cm/sDescribe        Arm Pressure (mmHG) +----------+--------+--------+----------------+-------------------+ [id number]     0       Multiphasic, [id number]                 +----------+--------+--------+----------------+-------------------+  +---------+--------+--+--------+--+---------+ VertebralPSV cm/s82EDV cm/s23Antegrade +---------+--------+--+--------+--+---------+        Summary: Right Carotid: Velocities in the right ICA are consistent with a 60-79%                stenosis.  Left Carotid: Velocities in the left ICA are consistent with a 40-59% stenosis.               Upper end of scale.  Vertebrals:  Bilateral vertebral arteries demonstrate antegrade flow. Subclavians: Normal flow hemodynamics were seen in bilateral subclavian              arteries.  *See table(s) above for measurements and observations. Suggest follow up study in 12 months.   Electronically signed by Ida Rogue MD on 08/28/2020 at 8:23:17 PM.       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 Uveitis-hyphema-glaucoma syndrome of left  eye  T85.398A    H20.9    H40.42X0   3. Pigmentary glaucoma of left eye, mild stage  H40.1321   4. Cystoid macular edema of left eye  H35.352   5. Retinal edema  H35.81 OCT, Retina - OU - Both Eyes  6. Dislocated IOL (intraocular lens), anterior, left  H27.122   7. Pseudophakia of both eyes  Z96.1   8. Lyme disease  A69.20   9. Dermatochalasis of both upper eyelids  H02.831    H02.834   10. Brow ptosis  H57.819    1-4. UGH Syndrome OS  - 3 piece PCIOL OS centered, but iris has large TID in sup temp quadrant with IOL haptic within area  - tmax at [facility] 39 OS and was started on max drops (Cosopt, Brim, latan, and po diamox)  - repeat gonio showed open angles OS, mild focal PAS  - s/p PPV w/ IOL exchange / sutured secondary Akreos OS IOL 04.08.21             - BCVA improved OS to 20/60 from 20/70 -- OCT shows interval re-development of CME OS             - IOP good at 18 today,  08.18.21 -- Rocklatan added by Dr. Shirleen Schirmer  - continue IOP drops per Dr. [name] -- appreciate his expert care  - Cosopt BID OS  - Brim TID OS  - Rocklatan QHS OS  - f/u 3-4 weeks, DFE, OCT  5,6. CME OS  - mild CME that was improved on 7.21.21 -- stopped Prolensa and started Lotemax taper  - interval re-development of CME (08.18.21)  - s/p STK OS (08.18.21)  - today, CME improved, almost completely resolved on exam and OCT  - BCVA 20/60 from 20/70   - cont Lotemax SM and Prolensa QID OS  - f/u 3-4 weeks, DFE, OCT  7. Pseudophakia OU  - s/p CE/IOL OU -- pt can't remember dates, one eye by [facility], other eye by [facility]  - h/o UGH OS and now sutured IOL as above  - PCIOL OD appears to be in good position  - monitor  8. Lyme disease  - history of diagnosis from target lesions on lower extremities  - no blood test confirmation performed  - completed doxycycline per PCP office  - no manifestations in the eye, but will continue to monitor  9,10. Brow ptosis, dermatochalasis OU  - referred  to Dr. Vickki Muff (now at [facility]) for eyelid eval  - holding on repair for now     Ophthalmic Meds Ordered this visit:  Meds ordered this encounter  Medications  . Loteprednol Etabonate (LOTEMAX SM) 0.38 % GEL    Sig: Place 1 drop into the left eye 4 (four) times daily.    Dispense:  5 g    Refill:  2       Return for f/u 3-4 weeks, CME OS, DFE, OCT.  There are no Patient Instructions on file for this visit.  This document serves as a record of services personally performed by Gardiner Sleeper, MD, PhD. It was created on their behalf by Roselee Nova, COMT. The creation of this record is the provider's dictation and/or activities during the visit.  Electronically signed by: Roselee Nova, COMT 08/29/20 12:45 PM   Gardiner Sleeper, M.D., Ph.D. Diseases & Surgery of the Retina and Vitreous [facility]  I have reviewed the above documentation for accuracy and completeness, and I agree with the above. Gardiner Sleeper, M.D., Ph.D. 08/29/20 12:45 PM    Abbreviations: M myopia (nearsighted); A astigmatism; H hyperopia (farsighted); P presbyopia; Mrx spectacle prescription;  CTL contact lenses; OD right eye; OS left eye; OU both eyes  XT exotropia; ET esotropia; PEK punctate epithelial keratitis; PEE punctate epithelial erosions; DES dry eye syndrome; MGD meibomian gland dysfunction; ATs artificial tears; PFAT's preservative free artificial tears; [place] nuclear sclerotic cataract; PSC posterior subcapsular cataract; ERM epi-retinal membrane; PVD posterior vitreous detachment; RD retinal detachment; DM diabetes mellitus; DR diabetic retinopathy; NPDR non-proliferative diabetic retinopathy; PDR proliferative diabetic retinopathy; CSME clinically significant macular edema; DME diabetic macular edema; dbh dot blot hemorrhages; CWS cotton wool spot; POAG primary open angle glaucoma; C/D cup-to-disc ratio; HVF humphrey visual field; GVF goldmann visual field; OCT optical coherence  tomography; IOP intraocular pressure; BRVO Branch retinal vein occlusion; CRVO central retinal vein occlusion; CRAO central retinal artery occlusion; BRAO branch retinal artery occlusion; RT retinal tear; SB scleral buckle; PPV pars plana vitrectomy; VH Vitreous hemorrhage; PRP panretinal laser photocoagulation; IVK intravitreal kenalog; VMT vitreomacular traction; MH Macular hole;  NVD neovascularization of the disc; NVE neovascularization elsewhere; AREDS age related eye disease study; ARMD age related macular degeneration; POAG  primary open angle glaucoma; EBMD epithelial/anterior basement membrane dystrophy; ACIOL anterior chamber intraocular lens; IOL intraocular lens; PCIOL posterior chamber intraocular lens; Phaco/IOL phacoemulsification with intraocular lens placement; [place] photorefractive keratectomy; LASIK laser assisted in situ keratomileusis; HTN hypertension; DM diabetes mellitus; COPD chronic obstructive pulmonary disease

## 2020-08-28 ENCOUNTER — Ambulatory Visit (INDEPENDENT_AMBULATORY_CARE_PROVIDER_SITE_OTHER): Payer: Medicare Other

## 2020-08-28 ENCOUNTER — Encounter (INDEPENDENT_AMBULATORY_CARE_PROVIDER_SITE_OTHER): Payer: Medicare Other | Admitting: Ophthalmology

## 2020-08-28 ENCOUNTER — Encounter (INDEPENDENT_AMBULATORY_CARE_PROVIDER_SITE_OTHER): Payer: Self-pay | Admitting: Ophthalmology

## 2020-08-28 ENCOUNTER — Other Ambulatory Visit: Payer: Self-pay

## 2020-08-28 ENCOUNTER — Other Ambulatory Visit: Payer: Self-pay | Admitting: Cardiology

## 2020-08-28 ENCOUNTER — Ambulatory Visit (INDEPENDENT_AMBULATORY_CARE_PROVIDER_SITE_OTHER): Payer: Medicare Other | Admitting: Ophthalmology

## 2020-08-28 DIAGNOSIS — H02831 Dermatochalasis of right upper eyelid: Secondary | ICD-10-CM

## 2020-08-28 DIAGNOSIS — I6523 Occlusion and stenosis of bilateral carotid arteries: Secondary | ICD-10-CM

## 2020-08-28 DIAGNOSIS — H401321 Pigmentary glaucoma, left eye, mild stage: Secondary | ICD-10-CM

## 2020-08-28 DIAGNOSIS — H4042X0 Glaucoma secondary to eye inflammation, left eye, stage unspecified: Secondary | ICD-10-CM

## 2020-08-28 DIAGNOSIS — H27122 Anterior dislocation of lens, left eye: Secondary | ICD-10-CM

## 2020-08-28 DIAGNOSIS — H35352 Cystoid macular degeneration, left eye: Secondary | ICD-10-CM

## 2020-08-28 DIAGNOSIS — Z961 Presence of intraocular lens: Secondary | ICD-10-CM | POA: Diagnosis not present

## 2020-08-28 DIAGNOSIS — H57819 Brow ptosis, unspecified: Secondary | ICD-10-CM

## 2020-08-28 DIAGNOSIS — H02834 Dermatochalasis of left upper eyelid: Secondary | ICD-10-CM | POA: Diagnosis not present

## 2020-08-28 DIAGNOSIS — H209 Unspecified iridocyclitis: Secondary | ICD-10-CM | POA: Diagnosis not present

## 2020-08-28 DIAGNOSIS — H3581 Retinal edema: Secondary | ICD-10-CM

## 2020-08-28 DIAGNOSIS — A6920 Lyme disease, unspecified: Secondary | ICD-10-CM | POA: Diagnosis not present

## 2020-08-28 DIAGNOSIS — I779 Disorder of arteries and arterioles, unspecified: Secondary | ICD-10-CM

## 2020-08-28 DIAGNOSIS — T85398A Other mechanical complication of other ocular prosthetic devices, implants and grafts, initial encounter: Secondary | ICD-10-CM

## 2020-08-28 MED ORDER — LOTEMAX SM 0.38 % OP GEL: 1.0000 [drp] | Freq: Four times a day (QID) | OPHTHALMIC | 2 refills | Status: DC

## 2020-08-29 ENCOUNTER — Encounter (INDEPENDENT_AMBULATORY_CARE_PROVIDER_SITE_OTHER): Payer: Self-pay | Admitting: Ophthalmology

## 2020-08-29 ENCOUNTER — Telehealth: Payer: Self-pay | Admitting: *Deleted

## 2020-08-29 DIAGNOSIS — I779 Disorder of arteries and arterioles, unspecified: Secondary | ICD-10-CM

## 2020-08-29 NOTE — Telephone Encounter (Signed)
-----   Message from Satira Sark, MD sent at 08/29/2020  7:58 AM EDT ----- Results reviewed.  Overall stable, moderate range carotid artery disease, right greater than left.  We will continue with medical therapy, repeat carotid Dopplers in 1 year.

## 2020-08-29 NOTE — Telephone Encounter (Signed)
Patient's wife informed. Copy sent to PCP 

## 2020-09-11 ENCOUNTER — Encounter (INDEPENDENT_AMBULATORY_CARE_PROVIDER_SITE_OTHER): Payer: Self-pay | Admitting: Ophthalmology

## 2020-09-11 ENCOUNTER — Other Ambulatory Visit (INDEPENDENT_AMBULATORY_CARE_PROVIDER_SITE_OTHER): Payer: Self-pay | Admitting: Ophthalmology

## 2020-09-11 MED ORDER — BRIMONIDINE TARTRATE 0.2 % OP SOLN: 1.0000 [drp] | Freq: Three times a day (TID) | OPHTHALMIC | 3 refills | Status: DC

## 2020-09-15 ENCOUNTER — Other Ambulatory Visit: Payer: Self-pay | Admitting: Cardiology

## 2020-09-24 DIAGNOSIS — Z6829 Body mass index (BMI) 29.0-29.9, adult: Secondary | ICD-10-CM | POA: Diagnosis not present

## 2020-09-24 DIAGNOSIS — M62838 Other muscle spasm: Secondary | ICD-10-CM | POA: Diagnosis not present

## 2020-09-24 NOTE — Progress Notes (Signed)
[facility] Note  09/25/2020     CHIEF COMPLAINT Patient presents for Retina Follow Up   HISTORY OF PRESENT ILLNESS: Christopher Randolph is a 74 y.o. male who presents to the clinic today for:   HPI    Retina Follow Up    Patient presents with  Other.  In left eye.  This started 4 weeks ago.  I, the attending physician,  performed the HPI with the patient and updated documentation appropriately.          Comments    Patient here for 4 weeks retina follow up for CME OS. Patient states vision about the same. No eye pain.        Last edited by [name], MD on 09/26/2020  2:02 AM. (History)    Patient states his vision still feels "fuzzy", he has an appt with Dr. [name] in a couple weeks to get new glasses   Referring physician: [name], [address],  [place] [address]  HISTORICAL INFORMATION:   Selected notes from the MEDICAL RECORD NUMBER Referred by Dr. [name] for concern of vitreous hemorrhage    CURRENT MEDICATIONS: Current Outpatient Medications (Ophthalmic Drugs)  Medication Sig  . bacitracin-polymyxin b (POLYSPORIN) ophthalmic ointment Place into the left eye at bedtime. Place a 1/2 inch ribbon of ointment into the lower eyelid at bedtime and as needed (Patient not taking: Reported on 08/28/2020)  . brimonidine (ALPHAGAN) 0.2 % ophthalmic solution Place 1 drop into the left eye 3 (three) times daily.  . Bromfenac Sodium (PROLENSA) 0.07 % SOLN Place 1 drop into the left eye 4 (four) times daily.  . dorzolamide-timolol (COSOPT) 22.3-6.8 MG/ML ophthalmic solution Place 1 drop into the left eye 2 (two) times daily.  Marland Kitchen ketorolac (ACULAR) 0.5 % ophthalmic solution Place 1 drop into the left eye 3 (three) times daily. (Patient not taking: Reported on 08/28/2020)  . loteprednol (LOTEMAX) 0.5 % ophthalmic suspension Place 1 drop into the left eye 4 (four) times daily. (Patient not taking: Reported on 08/28/2020)  .  Loteprednol Etabonate (INVELTYS) 1 % SUSP Apply 1 drop to eye 2 (two) times a day. 1 drop 2 times daily left eye  . Loteprednol Etabonate (LOTEMAX SM) 0.38 % GEL Place 1 drop into the left eye 4 (four) times daily.  . Netarsudil-Latanoprost (ROCKLATAN) 0.02-0.005 % SOLN Place 1 drop into the left eye at bedtime.   . prednisoLONE acetate (PRED FORTE) 1 % ophthalmic suspension Place 1 drop into the left eye 3 (three) times daily. (Patient not taking: Reported on 08/28/2020)   No current facility-administered medications for this visit. (Ophthalmic Drugs)   Current Outpatient Medications (Other)  Medication Sig  . amLODipine (NORVASC) 5 MG tablet Take 5 mg by mouth daily.  Marland Kitchen aspirin EC 81 MG tablet Take 81 mg by mouth daily.    Marland Kitchen atorvastatin (LIPITOR) 20 MG tablet TAKE 1 TABLET BY MOUTH DAILY (Patient taking differently: Take 20 mg by mouth daily. )  . fluticasone (FLONASE ALLERGY RELIEF) 50 MCG/ACT nasal spray Place 2 sprays into both nostrils daily.   Marland Kitchen levothyroxine (SYNTHROID, LEVOTHROID) 150 MCG tablet Take 150 mcg by mouth See admin instructions. Alternates taking 150 mg daily for 2 days then 175 mcg the third day then repeat  . levothyroxine (SYNTHROID, LEVOTHROID) 175 MCG tablet Take 175 mcg by mouth See admin instructions. Alternates taking 150 mg daily for 2 days then 175 mcg the third day then  repeat  . metFORMIN (GLUCOPHAGE) 500 MG tablet Take 500 mg by mouth daily.   . metoprolol tartrate (LOPRESSOR) 25 MG tablet Take 25 mg by mouth 2 (two) times daily.   Marland Kitchen MITIGARE 0.6 MG CAPS TcT BY MOUTH AT ONE TIME, THEN TAKE 1 CAPSULE ONE HOUR. TAKE AS NEEDED FOR FOR GOUT ATTACKS  . mometasone (ELOCON) 0.1 % ointment Apply 1 application topically daily.   . naproxen sodium (ANAPROX) 220 MG tablet Take 220 mg by mouth daily as needed (for pain.).   Marland Kitchen nitroGLYCERIN (NITROSTAT) 0.4 MG SL tablet DISSOLVE 1 TABLET UNDER THE TONGUE EVERY 5 MINUTES AS NEEDED FOR CHEST PAIN. DO NOT EXCEED A TOTAL OF 3 DOSES  IN 15 MINUTES.  . tamsulosin (FLOMAX) 0.4 MG CAPS capsule Take 0.4 mg by mouth 2 (two) times daily.  Marland Kitchen telmisartan (MICARDIS) 80 MG tablet TAKE 1 TABLET BY MOUTH EVERY DAY   No current facility-administered medications for this visit. (Other)      REVIEW OF SYSTEMS: ROS    Positive for: Musculoskeletal, Cardiovascular, Eyes   Negative for: Constitutional, Gastrointestinal, Neurological, Skin, Genitourinary, HENT, Endocrine, Respiratory, Psychiatric, Allergic/Imm, Heme/Lymph   Last edited by [name], COA on 09/25/2020  9:54 AM. (History)       ALLERGIES Allergies  Allergen Reactions  . Azithromycin Swelling  . Shrimp [Shellfish Allergy] Other (See Comments)    Gout flares    PAST MEDICAL HISTORY Past Medical History:  Diagnosis Date  . Arthritis   . CAD (coronary artery disease)    Branch vessel and moderate RCA disease 2006  . Cancer ([place]) 1990   Melanoma Lower right Leg  . Carotid artery disease ([place])   . Cataracts, bilateral   . Essential hypertension   . Glaucoma   . Hyperlipidemia   . Hypothyroidism   . Lyme disease   . PAD (peripheral artery disease) ([place])    Left common iliac stent 2004  . Pneumonia    x 1  . PONV (postoperative nausea and vomiting)   . Type 2 diabetes mellitus ([place])   . Wears dentures   . Wears glasses    Past Surgical History:  Procedure Laterality Date  . CATARACT EXTRACTION Bilateral   . CATARACT EXTRACTION, BILATERAL    . CERVICAL DISC SURGERY     x 1  . COLONOSCOPY N/A 11/06/2016   Procedure: COLONOSCOPY;  Surgeon: Daneil Dolin, MD;  [facility];  Service: Endoscopy;  Laterality: N/A;  7:30 AM  . COLONOSCOPY  2012   Dr. Anthony Sar: normal. reviewed reports, which states he has a history of polyps in remote past.   . EYE SURGERY    . JOINT REPLACEMENT    . LUMBAR WOUND DEBRIDEMENT N/A 01/24/2019   Procedure: LUMBAR WOUND Exploration for Evacation of Seroma vs. Hematoma;  Surgeon: Jovita Gamma, MD;   Location: [facility];  Service: Neurosurgery;  Laterality: N/A;  . MELANOMA EXCISION     right leg, [facility] and underwent immunotherapy  . NECK SURGERY      x 1 yrs ago  . PARS PLANA VITRECTOMY Left 04/04/2020   Procedure: PARS PLANA VITRECTOMY WITH 25G REMOVAL/SUTURE INTRAOCULAR LENS;  Surgeon: [name], MD;  Location: [facility];  Service: Ophthalmology;  Laterality: Left;  . REPLACEMENT TOTAL KNEE Right   . WISDOM TOOTH EXTRACTION      FAMILY HISTORY Family History  Problem Relation Age of Onset  . Heart disease Mother   . Heart attack Mother   .  Colon polyps Mother   . Heart attack Maternal Grandmother   . Heart attack Maternal Grandfather   . Colon polyps Brother   . Colon cancer Neg Hx     SOCIAL HISTORY Social History   Tobacco Use  . Smoking status: Former Smoker    Packs/day: 1.50    Years: 40.00    Pack years: 60.00    Types: Cigarettes    Start date: 11/04/1963    Quit date: 10/29/2003    Years since quitting: 16.9  . Smokeless tobacco: Never Used  Vaping Use  . Vaping Use: Never used  Substance Use Topics  . Alcohol use: Yes    Alcohol/week: 7.0 standard drinks    Types: 7 Standard drinks or equivalent per week    Comment: one miller lite daily   . Drug use: No         OPHTHALMIC EXAM:  Base Eye Exam    Visual Acuity (Snellen - Linear)      Right Left   Dist cc 20/15 20/80   Dist ph cc  20/40   Correction: Glasses       Tonometry (Tonopen, 9:52 AM)      Right Left   Pressure 20 18       Pupils      Dark Light Shape React APD   Right 3 2 Round Brisk None   Left 3 2 Round Brisk None       Visual Fields (Counting fingers)      Left Right    Full Full       Extraocular Movement      Right Left    Full, Ortho Full, Ortho       Neuro/Psych    Oriented x3: Yes   Mood/Affect: Normal       Dilation    Both eyes: 1.0% Mydriacyl, 2.5% Phenylephrine @ 9:52 AM        Slit Lamp and Fundus Exam    External Exam      Right Left    External brow ptosis brow ptosis; mild periorbital edema and erythema       Slit Lamp Exam      Right Left   Lids/Lashes Dermatochalasis - upper lid, Telangiectasia, mild Meibomian gland dysfunction Dermatochalasis, mild Ptosis , Meibomian gland dysfunction   Conjunctiva/Sclera superior and temporal Pinguecula temporal Pinguecula   Cornea mild arcus, trace Punctate epithelial erosions, Debris in tear film mild arcus, trace Punctate epithelial erosions, well healed superior cataract wound, mild Debris in tear film   Anterior Chamber Deep and quiet, no cell/flare Deep and quiet   Iris No NVI, Round and dilated No NVI, moderately dilated to 4.64mm, Transillumination defects from 0100-0200   Lens Posterior chamber intraocular lens in good position Sutured Akreos IOL in good position   Vitreous vitreous syneresis post vitrectomy, clear       Fundus Exam      Right Left   Disc Pink and Sharp Pink and Sharp, Compact   C/D Ratio 0.2 0.3   Macula Flat, mild RPE mottling, No heme or edema Flat. blunted foveal reflex, central CME -- resolved, mild RPE mottling, No heme   Vessels normal, mildly Tortuous Vascular attenuation, mild Tortuousity   Periphery Attached, round focal area of CR atrophy at 0630 mid zone Attached        Refraction    Wearing Rx      Sphere Cylinder Axis   Right +0.50 Sphere    Left -0.25 +  0.25 037   Type: Trifocal          IMAGING AND PROCEDURES  Imaging and Procedures for @TODAY @  OCT, Retina - OU - Both Eyes       Right Eye Quality was good. Central Foveal Thickness: 270. Progression has been stable. Findings include normal foveal contour, no SRF, no IRF (partial PVD - stable).   Left Eye Quality was good. Central Foveal Thickness: 303. Progression has improved. Findings include normal foveal contour, no IRF, no SRF (Interval resolution of CME, interval improvement in ellipsoid signal).   Notes *Images captured and stored on drive  Diagnosis /  Impression:  OD: NFP, no IRF, no SRF; partial PVD OS: Interval resolution of CME, interval improvement in ellipsoid signal  Clinical management:  See below  Abbreviations: NFP - Normal foveal profile. CME - cystoid macular edema. PED - pigment epithelial detachment. IRF - intraretinal fluid. SRF - subretinal fluid. EZ - ellipsoid zone. ERM - epiretinal membrane. ORA - outer retinal atrophy. ORT - outer retinal tubulation. 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 Uveitis-hyphema-glaucoma syndrome of left eye  T85.398A    H20.9    H40.42X0   3. Pigmentary glaucoma of left eye, mild stage  H40.1321   4. Cystoid macular edema of left eye  H35.352   5. Retinal edema  H35.81 OCT, Retina - OU - Both Eyes  6. Dislocated IOL (intraocular lens), anterior, left  H27.122   7. Pseudophakia of both eyes  Z96.1   8. Lyme disease  A69.20   9. Dermatochalasis of both upper eyelids  H02.831    H02.834   10. Brow ptosis  H57.819    1-4. UGH Syndrome OS  - 3 piece PCIOL OS centered, but iris has large TID in sup temp quadrant with IOL haptic within area  - tmax at [facility] 39 OS and was started on max drops (Cosopt, Brim, latan, and po diamox)  - repeat gonio showed open angles OS, mild focal PAS  - s/p PPV w/ IOL exchange / sutured secondary Akreos OS IOL 04.08.21             - BCVA improved OS back to 20/40 from 20/60 -- back to baseline  - OCT shows interval resolution of CME OS             - IOP okay at 18 today, 08.18.21 -- Rocklatan added by Dr. Shirleen Schirmer  - continue IOP drops per Dr. [name] -- appreciate his expert care  - Cosopt BID OS  - Brim TID OS  - Rocklatan QHS OS  - f/u 2-3 mos  5,6. CME OS  - mild CME that was improved on 7.21.21 -- stopped Prolensa and started Lotemax taper  - interval re-development of CME (08.18.21)  - s/p STK OS (08.18.21)  - today, CME completely resolved on  exam and OCT  - BCVA 20/40 from 20/60   - cont Lotemax SM and Prolensa OS -- start taper, TID x2 weeks, then maintain BID until next appt   - f/u 2-3 months, DFE, OCT  7. Pseudophakia OU  - s/p CE/IOL OU -- pt can't remember dates, one eye by [facility], other eye by [facility]  - h/o UGH OS and now sutured IOL as above  - PCIOL OD appears to be  in good position  - monitor  8. Lyme disease  - history of diagnosis from target lesions on lower extremities  - no blood test confirmation performed  - completed doxycycline per PCP office  - no manifestations in the eye, but will continue to monitor  9,10. Brow ptosis, dermatochalasis OU  - referred to Dr. Vickki Muff (now at [facility]) for eyelid eval  - holding on repair for now     Ophthalmic Meds Ordered this visit:  No orders of the defined types were placed in this encounter.      Return for f/u 2-3 months, CME OS, DFE, OCT.  There are no Patient Instructions on file for this visit.  This document serves as a record of services personally performed by Gardiner Sleeper, MD, PhD. It was created on their behalf by Roselee Nova, COMT. The creation of this record is the provider's dictation and/or activities during the visit.  Electronically signed by: Roselee Nova, COMT 09/26/20 2:07 AM   This document serves as a record of services personally performed by Gardiner Sleeper, MD, PhD. It was created on their behalf by San Jetty. Owens Shark, OA an ophthalmic technician. The creation of this record is the provider's dictation and/or activities during the visit.    Electronically signed by: San Jetty. Owens Shark, [place] 09.29.2021 2:07 AM   Gardiner Sleeper, M.D., Ph.D. Diseases & Surgery of the Retina and Vitreous [facility]  I have reviewed the above documentation for accuracy and completeness, and I agree with the above. Gardiner Sleeper, M.D., Ph.D. 09/26/20 2:07 AM   Abbreviations: M myopia (nearsighted); A astigmatism; H  hyperopia (farsighted); P presbyopia; Mrx spectacle prescription;  CTL contact lenses; OD right eye; OS left eye; OU both eyes  XT exotropia; ET esotropia; PEK punctate epithelial keratitis; PEE punctate epithelial erosions; DES dry eye syndrome; MGD meibomian gland dysfunction; ATs artificial tears; PFAT's preservative free artificial tears; [place] nuclear sclerotic cataract; PSC posterior subcapsular cataract; ERM epi-retinal membrane; PVD posterior vitreous detachment; RD retinal detachment; DM diabetes mellitus; DR diabetic retinopathy; NPDR non-proliferative diabetic retinopathy; PDR proliferative diabetic retinopathy; CSME clinically significant macular edema; DME diabetic macular edema; dbh dot blot hemorrhages; CWS cotton wool spot; POAG primary open angle glaucoma; C/D cup-to-disc ratio; HVF humphrey visual field; GVF goldmann visual field; OCT optical coherence tomography; IOP intraocular pressure; BRVO Branch retinal vein occlusion; CRVO central retinal vein occlusion; CRAO central retinal artery occlusion; BRAO branch retinal artery occlusion; RT retinal tear; SB scleral buckle; PPV pars plana vitrectomy; VH Vitreous hemorrhage; PRP panretinal laser photocoagulation; IVK intravitreal kenalog; VMT vitreomacular traction; MH Macular hole;  NVD neovascularization of the disc; NVE neovascularization elsewhere; AREDS age related eye disease study; ARMD age related macular degeneration; POAG primary open angle glaucoma; EBMD epithelial/anterior basement membrane dystrophy; ACIOL anterior chamber intraocular lens; IOL intraocular lens; PCIOL posterior chamber intraocular lens; Phaco/IOL phacoemulsification with intraocular lens placement; [place] photorefractive keratectomy; LASIK laser assisted in situ keratomileusis; HTN hypertension; DM diabetes mellitus; COPD chronic obstructive pulmonary disease

## 2020-09-25 ENCOUNTER — Encounter (INDEPENDENT_AMBULATORY_CARE_PROVIDER_SITE_OTHER): Payer: Self-pay | Admitting: Ophthalmology

## 2020-09-25 ENCOUNTER — Ambulatory Visit (INDEPENDENT_AMBULATORY_CARE_PROVIDER_SITE_OTHER): Payer: Medicare Other | Admitting: Ophthalmology

## 2020-09-25 ENCOUNTER — Other Ambulatory Visit: Payer: Self-pay

## 2020-09-25 DIAGNOSIS — H4042X0 Glaucoma secondary to eye inflammation, left eye, stage unspecified: Secondary | ICD-10-CM | POA: Diagnosis not present

## 2020-09-25 DIAGNOSIS — H57819 Brow ptosis, unspecified: Secondary | ICD-10-CM

## 2020-09-25 DIAGNOSIS — H209 Unspecified iridocyclitis: Secondary | ICD-10-CM | POA: Diagnosis not present

## 2020-09-25 DIAGNOSIS — H27122 Anterior dislocation of lens, left eye: Secondary | ICD-10-CM | POA: Diagnosis not present

## 2020-09-25 DIAGNOSIS — Z961 Presence of intraocular lens: Secondary | ICD-10-CM

## 2020-09-25 DIAGNOSIS — H02834 Dermatochalasis of left upper eyelid: Secondary | ICD-10-CM | POA: Diagnosis not present

## 2020-09-25 DIAGNOSIS — H3581 Retinal edema: Secondary | ICD-10-CM | POA: Diagnosis not present

## 2020-09-25 DIAGNOSIS — H02831 Dermatochalasis of right upper eyelid: Secondary | ICD-10-CM

## 2020-09-25 DIAGNOSIS — H401321 Pigmentary glaucoma, left eye, mild stage: Secondary | ICD-10-CM

## 2020-09-25 DIAGNOSIS — H35352 Cystoid macular degeneration, left eye: Secondary | ICD-10-CM | POA: Diagnosis not present

## 2020-09-25 DIAGNOSIS — A6920 Lyme disease, unspecified: Secondary | ICD-10-CM | POA: Diagnosis not present

## 2020-09-25 DIAGNOSIS — T85398A Other mechanical complication of other ocular prosthetic devices, implants and grafts, initial encounter: Secondary | ICD-10-CM

## 2020-09-26 ENCOUNTER — Encounter (INDEPENDENT_AMBULATORY_CARE_PROVIDER_SITE_OTHER): Payer: Self-pay | Admitting: Ophthalmology

## 2020-10-03 DIAGNOSIS — Z23 Encounter for immunization: Secondary | ICD-10-CM | POA: Diagnosis not present

## 2020-10-11 DIAGNOSIS — H02834 Dermatochalasis of left upper eyelid: Secondary | ICD-10-CM | POA: Diagnosis not present

## 2020-10-11 DIAGNOSIS — H02831 Dermatochalasis of right upper eyelid: Secondary | ICD-10-CM | POA: Diagnosis not present

## 2020-10-11 DIAGNOSIS — Z961 Presence of intraocular lens: Secondary | ICD-10-CM | POA: Diagnosis not present

## 2020-10-11 DIAGNOSIS — T8579XS Infection and inflammatory reaction due to other internal prosthetic devices, implants and grafts, sequela: Secondary | ICD-10-CM | POA: Diagnosis not present

## 2020-10-14 NOTE — Progress Notes (Addendum)
[facility] Note  10/15/2020     CHIEF COMPLAINT Patient presents for Retina Follow Up   HISTORY OF PRESENT ILLNESS: Christopher Randolph is a 74 y.o. male who presents to the clinic today for:   HPI    Retina Follow Up    Patient presents with  Other.  In left eye.  This started 3 weeks ago.  I, the attending physician,  performed the HPI with the patient and updated documentation appropriately.          Comments    Patient here for 3 weeks retina follow up for CME OS. Patient states vision about the same. Saw Dr Christopher Randolph last week. The pressure was back up in OS. If it don't go back down dr will have to put tube in eye. No eye pain. Eye waters a lot feel uncomfortable. Burns and stings a little bit.       Last edited by Christopher Caffey, MD on 10/15/2020 12:32 PM. (History)    pt states he saw Dr. Katy Randolph last week and his IOP was 30 in his office, pt states Dr. Katy Randolph said if it doesn't come down, he is going to have to have glaucoma sx, Dr. Katy Randolph added Christopher Randolph, but pt states that it is very expensive  Referring physician: [name], MD [address],  [place] [phone]  HISTORICAL INFORMATION:   Selected notes from the MEDICAL RECORD NUMBER Referred by Dr. Vicie Randolph for concern of vitreous hemorrhage   CURRENT MEDICATIONS: Current Outpatient Medications (Ophthalmic Drugs)  Medication Sig  . bacitracin-polymyxin b (POLYSPORIN) ophthalmic ointment Place into the left eye at bedtime. Place a 1/2 inch ribbon of ointment into the lower eyelid at bedtime and as needed (Patient not taking: Reported on 08/28/2020)  . brimonidine (ALPHAGAN) 0.2 % ophthalmic solution Place 1 drop into the left eye 3 (three) times daily.  . Bromfenac Sodium (PROLENSA) 0.07 % SOLN Place 1 drop into the left eye 4 (four) times daily.  . dorzolamide-timolol (COSOPT) 22.3-6.8 MG/ML ophthalmic solution Place 1 drop into the left eye 2 (two) times daily.  Marland Kitchen  ketorolac (ACULAR) 0.5 % ophthalmic solution Place 1 drop into the left eye 3 (three) times daily. (Patient not taking: Reported on 08/28/2020)  . loteprednol (LOTEMAX) 0.5 % ophthalmic suspension Place 1 drop into the left eye 4 (four) times daily. (Patient not taking: Reported on 08/28/2020)  . Loteprednol Etabonate (INVELTYS) 1 % SUSP Apply 1 drop to eye 2 (two) times a day. 1 drop 2 times daily left eye  . Loteprednol Etabonate (LOTEMAX SM) 0.38 % GEL Place 1 drop into the left eye 4 (four) times daily.  . Netarsudil-Latanoprost (ROCKLATAN) 0.02-0.005 % SOLN Place 1 drop into the left eye at bedtime.   . prednisoLONE acetate (PRED FORTE) 1 % ophthalmic suspension Place 1 drop into the left eye 3 (three) times daily. (Patient not taking: Reported on 08/28/2020)   No current facility-administered medications for this visit. (Ophthalmic Drugs)   Current Outpatient Medications (Other)  Medication Sig  . amLODipine (NORVASC) 5 MG tablet Take 5 mg by mouth daily.  Marland Kitchen aspirin EC 81 MG tablet Take 81 mg by mouth daily.    Marland Kitchen atorvastatin (LIPITOR) 20 MG tablet TAKE 1 TABLET BY MOUTH DAILY (Patient taking differently: Take 20 mg by mouth daily. )  . fluticasone (FLONASE ALLERGY RELIEF) 50 MCG/ACT nasal spray Place 2 sprays into both nostrils daily.   Marland Kitchen levothyroxine (SYNTHROID,  LEVOTHROID) 150 MCG tablet Take 150 mcg by mouth See admin instructions. Alternates taking 150 mg daily for 2 days then 175 mcg the third day then repeat  . levothyroxine (SYNTHROID, LEVOTHROID) 175 MCG tablet Take 175 mcg by mouth See admin instructions. Alternates taking 150 mg daily for 2 days then 175 mcg the third day then repeat  . metFORMIN (GLUCOPHAGE) 500 MG tablet Take 500 mg by mouth daily.   . metoprolol tartrate (LOPRESSOR) 25 MG tablet Take 25 mg by mouth 2 (two) times daily.   Marland Kitchen MITIGARE 0.6 MG CAPS TcT BY MOUTH AT ONE TIME, THEN TAKE 1 CAPSULE ONE HOUR. TAKE AS NEEDED FOR FOR GOUT ATTACKS  . mometasone (ELOCON) 0.1 %  ointment Apply 1 application topically daily.   . naproxen sodium (ANAPROX) 220 MG tablet Take 220 mg by mouth daily as needed (for pain.).   Marland Kitchen nitroGLYCERIN (NITROSTAT) 0.4 MG SL tablet DISSOLVE 1 TABLET UNDER THE TONGUE EVERY 5 MINUTES AS NEEDED FOR CHEST PAIN. DO NOT EXCEED A TOTAL OF 3 DOSES IN 15 MINUTES.  . tamsulosin (FLOMAX) 0.4 MG CAPS capsule Take 0.4 mg by mouth 2 (two) times daily.  Marland Kitchen telmisartan (MICARDIS) 80 MG tablet TAKE 1 TABLET BY MOUTH EVERY DAY   No current facility-administered medications for this visit. (Other)      REVIEW OF SYSTEMS: ROS    Positive for: Musculoskeletal, Cardiovascular, Eyes   Negative for: Constitutional, Gastrointestinal, Neurological, Skin, Genitourinary, HENT, Endocrine, Respiratory, Psychiatric, Allergic/Imm, Heme/Lymph   Last edited by Christopher Randolph, COA on 10/15/2020 10:00 AM. (History)       ALLERGIES Allergies  Allergen Reactions  . Azithromycin Swelling  . Shrimp [Shellfish Allergy] Other (See Comments)    Gout flares    PAST MEDICAL HISTORY Past Medical History:  Diagnosis Date  . Arthritis   . CAD (coronary artery disease)    Branch vessel and moderate RCA disease 2006  . Cancer ([place]) 1990   Melanoma Lower right Leg  . Carotid artery disease ([place])   . Cataracts, bilateral   . Essential hypertension   . Glaucoma   . Hyperlipidemia   . Hypothyroidism   . Lyme disease   . PAD (peripheral artery disease) ([place])    Left common iliac stent 2004  . Pneumonia    x 1  . PONV (postoperative nausea and vomiting)   . Type 2 diabetes mellitus ([place])   . Wears dentures   . Wears glasses    Past Surgical History:  Procedure Laterality Date  . CATARACT EXTRACTION Bilateral   . CATARACT EXTRACTION, BILATERAL    . CERVICAL DISC SURGERY     x 1  . COLONOSCOPY N/A 11/06/2016   Procedure: COLONOSCOPY;  Surgeon: Daneil Dolin, MD;  [facility];  Service: Endoscopy;  Laterality: N/A;  7:30 AM  . COLONOSCOPY  2012    Dr. Anthony Sar: normal. reviewed reports, which states he has a history of polyps in remote past.   . EYE SURGERY    . JOINT REPLACEMENT    . LUMBAR WOUND DEBRIDEMENT N/A 01/24/2019   Procedure: LUMBAR WOUND Exploration for Evacation of Seroma vs. Hematoma;  Surgeon: Jovita Gamma, MD;  Location: [facility];  Service: Neurosurgery;  Laterality: N/A;  . MELANOMA EXCISION     right leg, [facility] and underwent immunotherapy  . NECK SURGERY      x 1 yrs ago  . PARS PLANA VITRECTOMY Left 04/04/2020   Procedure: PARS PLANA VITRECTOMY WITH  25G REMOVAL/SUTURE INTRAOCULAR LENS;  Surgeon: Christopher Caffey, MD;  Location: [facility];  Service: Ophthalmology;  Laterality: Left;  . REPLACEMENT TOTAL KNEE Right   . WISDOM TOOTH EXTRACTION      FAMILY HISTORY Family History  Problem Relation Age of Onset  . Heart disease Mother   . Heart attack Mother   . Colon polyps Mother   . Heart attack Maternal Grandmother   . Heart attack Maternal Grandfather   . Colon polyps Brother   . Colon cancer Neg Hx     SOCIAL HISTORY Social History   Tobacco Use  . Smoking status: Former Smoker    Packs/day: 1.50    Years: 40.00    Pack years: 60.00    Types: Cigarettes    Start date: 11/04/1963    Quit date: 10/29/2003    Years since quitting: 16.9  . Smokeless tobacco: Never Used  Vaping Use  . Vaping Use: Never used  Substance Use Topics  . Alcohol use: Yes    Alcohol/week: 7.0 standard drinks    Types: 7 Standard drinks or equivalent per week    Comment: one miller lite daily   . Drug use: No         OPHTHALMIC EXAM:  Base Eye Exam    Visual Acuity (Snellen - Linear)      Right Left   Dist cc 20/15 -1 20/80   Dist ph cc  20/40 -2   Correction: Glasses       Tonometry (Tonopen, 9:54 AM)      Right Left   Pressure 19 21       Pupils      Dark Light Shape React APD   Right 3 2 Round Brisk None   Left 3 2 Round Brisk None       Visual Fields (Counting fingers)      Left Right    Full  Full       Extraocular Movement      Right Left    Full, Ortho Full, Ortho       Neuro/Psych    Oriented x3: Yes   Mood/Affect: Normal       Dilation    Both eyes: 1.0% Mydriacyl, 2.5% Phenylephrine @ 9:54 AM        Slit Lamp and Fundus Exam    External Exam      Right Left   External brow ptosis brow ptosis; mild periorbital edema and erythema       Slit Lamp Exam      Right Left   Lids/Lashes Dermatochalasis - upper lid, Telangiectasia, mild Meibomian gland dysfunction Dermatochalasis, mild Ptosis , Meibomian gland dysfunction   Conjunctiva/Sclera superior and temporal Pinguecula temporal Pinguecula   Cornea mild arcus, trace Punctate epithelial erosions, Debris in tear film mild arcus, trace Punctate epithelial erosions, well healed superior cataract wound, mild Debris in tear film   Anterior Chamber Deep and quiet, no cell/flare Deep and quiet   Iris No NVI, Round and dilated No NVI, moderately dilated to 4.36mm, Transillumination defects from 0100-0200   Lens Posterior chamber intraocular lens in good position Sutured Akreos IOL in good position   Vitreous vitreous syneresis post vitrectomy, clear       Fundus Exam      Right Left   Disc Pink and Sharp Pink and Sharp, Compact   C/D Ratio 0.2 0.3   Macula Flat, mild RPE mottling, No heme or edema Flat. blunted foveal reflex, stable resolution of  central CME, mild RPE mottling, No heme   Vessels normal, mildly Tortuous Vascular attenuation, mild Tortuousity   Periphery Attached, round focal area of CR atrophy at 0630 mid zone Attached        Refraction    Wearing Rx      Sphere Cylinder Axis   Right +0.50 Sphere    Left -0.25 +0.25 037   Type: Trifocal          IMAGING AND PROCEDURES  Imaging and Procedures for @TODAY @  OCT, Retina - OU - Both Eyes       Right Eye Quality was good. Central Foveal Thickness: 274. Progression has been stable. Findings include normal foveal contour, no SRF, no IRF  (partial PVD - stable).   Left Eye Quality was good. Central Foveal Thickness: 291. Progression has been stable. Findings include normal foveal contour, no IRF, no SRF (Stable resolution of CME, interval improvement in ellipsoid signal).   Notes *Images captured and stored on drive  Diagnosis / Impression:  OD: NFP, no IRF, no SRF; partial PVD OS: stable resolution of CME, interval improvement in ellipsoid signal  Clinical management:  See below  Abbreviations: NFP - Normal foveal profile. CME - cystoid macular edema. PED - pigment epithelial detachment. IRF - intraretinal fluid. SRF - subretinal fluid. EZ - ellipsoid zone. ERM - epiretinal membrane. ORA - outer retinal atrophy. ORT - outer retinal tubulation. 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 Uveitis-hyphema-glaucoma syndrome of left eye  T85.398A    H20.9    H40.42X0   3. Pigmentary glaucoma of left eye, mild stage  H40.1321   4. Cystoid macular edema of left eye  H35.352   5. Retinal edema  H35.81 OCT, Retina - OU - Both Eyes  6. Dislocated IOL (intraocular lens), anterior, left  H27.122   7. Pseudophakia of both eyes  Z96.1   8. Lyme disease  A69.20   9. Dermatochalasis of both upper eyelids  H02.831    H02.834   10. Brow ptosis  H57.819    1-4. UGH Syndrome OS  - 3 piece PCIOL OS centered, but iris has large TID in sup temp quadrant with IOL haptic within area  - tmax at [facility] 39 OS and was started on max drops (Cosopt, Brim, latan, and po diamox)  - repeat gonio showed open angles OS, mild focal PAS  - s/p PPV w/ IOL exchange / sutured secondary Akreos OS IOL 04.08.21             - BCVA OS 20/40 stable, back to baseline  - OCT shows stable resolution of CME OS (see below)             - IOP 21 today, 08.18.21 -- Rocklatan added by Dr. Shirleen Schirmer  - continue IOP drops per Dr. Katy Randolph -- appreciate his expert care  -  Cosopt BID OS  - Brim TID OS  - Rocklatan QHS OS  - per pt, Dr. Katy Randolph and he are contemplating glaucoma surgery OS  - f/u 3 mos  5,6. CME OS  - mild CME that was improved on 7.21.21 -- stopped Prolensa and started Lotemax taper  - interval re-development of CME (08.18.21)  - s/p STK OS (08.18.21)  - today, CME stably resolved on exam and OCT  - BCVA 20/40  from 20/60   - completed Lotemax SM and Prolensa taper  - f/u 3 months, DFE, OCT  7. Pseudophakia OU  - s/p CE/IOL OU -- pt can't remember dates, one eye by [facility], other eye by [facility]  - h/o UGH OS and now sutured IOL as above  - PCIOL OD appears to be in good position  - monitor  8. Lyme disease  - history of diagnosis from target lesions on lower extremities  - no blood test confirmation performed  - completed doxycycline per PCP office  - no manifestations in the eye, but will continue to monitor  9,10. Brow ptosis, dermatochalasis OU  - referred to Dr. Vickki Muff (now at [facility]) for eyelid eval  - holding on repair for now     Ophthalmic Meds Ordered this visit:  No orders of the defined types were placed in this encounter.      Return in about 3 months (around 01/15/2021) for f/u CME OS / UGH syndrome OS, DFE, OCT.  There are no Patient Instructions on file for this visit.  This document serves as a record of services personally performed by Gardiner Sleeper, MD, PhD. It was created on their behalf by Estill Bakes, COT an ophthalmic technician. The creation of this record is the provider's dictation and/or activities during the visit.    Electronically signed by: Estill Bakes, COT 10.18.21 @ 12:38 PM   This document serves as a record of services personally performed by Gardiner Sleeper, MD, PhD. It was created on their behalf by San Jetty. Owens Shark, OA an ophthalmic technician. The creation of this record is the provider's dictation and/or activities during the visit.    Electronically signed by: San Jetty.  Marguerita Merles 10.19.2021 12:38 PM  Gardiner Sleeper, M.D., Ph.D. Diseases & Surgery of the Retina and Mammoth 10/15/2020   I have reviewed the above documentation for accuracy and completeness, and I agree with the above. Gardiner Sleeper, M.D., Ph.D. 10/15/20 12:38 PM   Abbreviations: M myopia (nearsighted); A astigmatism; H hyperopia (farsighted); P presbyopia; Mrx spectacle prescription;  CTL contact lenses; OD right eye; OS left eye; OU both eyes  XT exotropia; ET esotropia; PEK punctate epithelial keratitis; PEE punctate epithelial erosions; DES dry eye syndrome; MGD meibomian gland dysfunction; ATs artificial tears; PFAT's preservative free artificial tears; [place] nuclear sclerotic cataract; PSC posterior subcapsular cataract; ERM epi-retinal membrane; PVD posterior vitreous detachment; RD retinal detachment; DM diabetes mellitus; DR diabetic retinopathy; NPDR non-proliferative diabetic retinopathy; PDR proliferative diabetic retinopathy; CSME clinically significant macular edema; DME diabetic macular edema; dbh dot blot hemorrhages; CWS cotton wool spot; POAG primary open angle glaucoma; C/D cup-to-disc ratio; HVF humphrey visual field; GVF goldmann visual field; OCT optical coherence tomography; IOP intraocular pressure; BRVO Branch retinal vein occlusion; CRVO central retinal vein occlusion; CRAO central retinal artery occlusion; BRAO branch retinal artery occlusion; RT retinal tear; SB scleral buckle; PPV pars plana vitrectomy; VH Vitreous hemorrhage; PRP panretinal laser photocoagulation; IVK intravitreal kenalog; VMT vitreomacular traction; MH Macular hole;  NVD neovascularization of the disc; NVE neovascularization elsewhere; AREDS age related eye disease study; ARMD age related macular degeneration; POAG primary open angle glaucoma; EBMD epithelial/anterior basement membrane dystrophy; ACIOL anterior chamber intraocular lens; IOL intraocular lens; PCIOL posterior  chamber intraocular lens; Phaco/IOL phacoemulsification with intraocular lens placement; [place] photorefractive keratectomy; LASIK laser assisted in situ keratomileusis; HTN hypertension; DM diabetes mellitus; COPD chronic obstructive pulmonary disease

## 2020-10-15 ENCOUNTER — Encounter (INDEPENDENT_AMBULATORY_CARE_PROVIDER_SITE_OTHER): Payer: Self-pay | Admitting: Ophthalmology

## 2020-10-15 ENCOUNTER — Ambulatory Visit (INDEPENDENT_AMBULATORY_CARE_PROVIDER_SITE_OTHER): Payer: Medicare Other | Admitting: Ophthalmology

## 2020-10-15 ENCOUNTER — Other Ambulatory Visit: Payer: Self-pay

## 2020-10-15 DIAGNOSIS — H02831 Dermatochalasis of right upper eyelid: Secondary | ICD-10-CM

## 2020-10-15 DIAGNOSIS — H35352 Cystoid macular degeneration, left eye: Secondary | ICD-10-CM | POA: Diagnosis not present

## 2020-10-15 DIAGNOSIS — H27122 Anterior dislocation of lens, left eye: Secondary | ICD-10-CM

## 2020-10-15 DIAGNOSIS — A6920 Lyme disease, unspecified: Secondary | ICD-10-CM

## 2020-10-15 DIAGNOSIS — H57819 Brow ptosis, unspecified: Secondary | ICD-10-CM

## 2020-10-15 DIAGNOSIS — H401321 Pigmentary glaucoma, left eye, mild stage: Secondary | ICD-10-CM | POA: Diagnosis not present

## 2020-10-15 DIAGNOSIS — H4042X0 Glaucoma secondary to eye inflammation, left eye, stage unspecified: Secondary | ICD-10-CM

## 2020-10-15 DIAGNOSIS — Z961 Presence of intraocular lens: Secondary | ICD-10-CM

## 2020-10-15 DIAGNOSIS — H3581 Retinal edema: Secondary | ICD-10-CM | POA: Diagnosis not present

## 2020-10-15 DIAGNOSIS — H209 Unspecified iridocyclitis: Secondary | ICD-10-CM

## 2020-10-15 DIAGNOSIS — H02834 Dermatochalasis of left upper eyelid: Secondary | ICD-10-CM

## 2020-10-15 DIAGNOSIS — T85398A Other mechanical complication of other ocular prosthetic devices, implants and grafts, initial encounter: Secondary | ICD-10-CM

## 2020-10-31 DIAGNOSIS — E782 Mixed hyperlipidemia: Secondary | ICD-10-CM | POA: Diagnosis not present

## 2020-10-31 DIAGNOSIS — E119 Type 2 diabetes mellitus without complications: Secondary | ICD-10-CM | POA: Diagnosis not present

## 2020-10-31 DIAGNOSIS — E039 Hypothyroidism, unspecified: Secondary | ICD-10-CM | POA: Diagnosis not present

## 2020-10-31 DIAGNOSIS — I10 Essential (primary) hypertension: Secondary | ICD-10-CM | POA: Diagnosis not present

## 2020-11-05 DIAGNOSIS — E782 Mixed hyperlipidemia: Secondary | ICD-10-CM | POA: Diagnosis not present

## 2020-11-05 DIAGNOSIS — J439 Emphysema, unspecified: Secondary | ICD-10-CM | POA: Diagnosis not present

## 2020-11-05 DIAGNOSIS — Z6828 Body mass index (BMI) 28.0-28.9, adult: Secondary | ICD-10-CM | POA: Diagnosis not present

## 2020-11-05 DIAGNOSIS — I10 Essential (primary) hypertension: Secondary | ICD-10-CM | POA: Diagnosis not present

## 2020-11-05 DIAGNOSIS — I779 Disorder of arteries and arterioles, unspecified: Secondary | ICD-10-CM | POA: Diagnosis not present

## 2020-11-05 DIAGNOSIS — E1151 Type 2 diabetes mellitus with diabetic peripheral angiopathy without gangrene: Secondary | ICD-10-CM | POA: Diagnosis not present

## 2020-11-05 DIAGNOSIS — I25119 Atherosclerotic heart disease of native coronary artery with unspecified angina pectoris: Secondary | ICD-10-CM | POA: Diagnosis not present

## 2020-11-05 DIAGNOSIS — Z0001 Encounter for general adult medical examination with abnormal findings: Secondary | ICD-10-CM | POA: Diagnosis not present

## 2020-11-06 DIAGNOSIS — Z23 Encounter for immunization: Secondary | ICD-10-CM | POA: Diagnosis not present

## 2020-11-11 DIAGNOSIS — H57813 Brow ptosis, bilateral: Secondary | ICD-10-CM | POA: Diagnosis not present

## 2020-11-11 DIAGNOSIS — H35352 Cystoid macular degeneration, left eye: Secondary | ICD-10-CM | POA: Diagnosis not present

## 2020-11-11 DIAGNOSIS — H02831 Dermatochalasis of right upper eyelid: Secondary | ICD-10-CM | POA: Diagnosis not present

## 2020-11-11 DIAGNOSIS — Z961 Presence of intraocular lens: Secondary | ICD-10-CM | POA: Diagnosis not present

## 2020-11-11 DIAGNOSIS — T8579XS Infection and inflammatory reaction due to other internal prosthetic devices, implants and grafts, sequela: Secondary | ICD-10-CM | POA: Diagnosis not present

## 2020-11-11 DIAGNOSIS — H02834 Dermatochalasis of left upper eyelid: Secondary | ICD-10-CM | POA: Diagnosis not present

## 2020-11-18 DIAGNOSIS — H40021 Open angle with borderline findings, high risk, right eye: Secondary | ICD-10-CM | POA: Diagnosis not present

## 2020-11-18 DIAGNOSIS — T85398A Other mechanical complication of other ocular prosthetic devices, implants and grafts, initial encounter: Secondary | ICD-10-CM | POA: Diagnosis not present

## 2020-11-18 DIAGNOSIS — Z961 Presence of intraocular lens: Secondary | ICD-10-CM | POA: Diagnosis not present

## 2020-11-18 DIAGNOSIS — H4042X0 Glaucoma secondary to eye inflammation, left eye, stage unspecified: Secondary | ICD-10-CM | POA: Diagnosis not present

## 2020-11-18 DIAGNOSIS — H209 Unspecified iridocyclitis: Secondary | ICD-10-CM | POA: Diagnosis not present

## 2020-12-02 DIAGNOSIS — L570 Actinic keratosis: Secondary | ICD-10-CM | POA: Diagnosis not present

## 2020-12-10 ENCOUNTER — Other Ambulatory Visit: Payer: Self-pay | Admitting: Cardiology

## 2020-12-16 DIAGNOSIS — T85398A Other mechanical complication of other ocular prosthetic devices, implants and grafts, initial encounter: Secondary | ICD-10-CM | POA: Diagnosis not present

## 2020-12-16 DIAGNOSIS — H209 Unspecified iridocyclitis: Secondary | ICD-10-CM | POA: Diagnosis not present

## 2020-12-16 DIAGNOSIS — Z961 Presence of intraocular lens: Secondary | ICD-10-CM | POA: Diagnosis not present

## 2020-12-16 DIAGNOSIS — H40021 Open angle with borderline findings, high risk, right eye: Secondary | ICD-10-CM | POA: Diagnosis not present

## 2020-12-16 DIAGNOSIS — H4042X0 Glaucoma secondary to eye inflammation, left eye, stage unspecified: Secondary | ICD-10-CM | POA: Diagnosis not present

## 2020-12-24 ENCOUNTER — Encounter (INDEPENDENT_AMBULATORY_CARE_PROVIDER_SITE_OTHER): Payer: Self-pay | Admitting: Ophthalmology

## 2021-01-06 DIAGNOSIS — H40021 Open angle with borderline findings, high risk, right eye: Secondary | ICD-10-CM | POA: Diagnosis not present

## 2021-01-06 DIAGNOSIS — H209 Unspecified iridocyclitis: Secondary | ICD-10-CM | POA: Diagnosis not present

## 2021-01-06 DIAGNOSIS — H4042X0 Glaucoma secondary to eye inflammation, left eye, stage unspecified: Secondary | ICD-10-CM | POA: Diagnosis not present

## 2021-01-06 DIAGNOSIS — Z961 Presence of intraocular lens: Secondary | ICD-10-CM | POA: Diagnosis not present

## 2021-01-06 DIAGNOSIS — T85398A Other mechanical complication of other ocular prosthetic devices, implants and grafts, initial encounter: Secondary | ICD-10-CM | POA: Diagnosis not present

## 2021-01-14 DIAGNOSIS — Z7982 Long term (current) use of aspirin: Secondary | ICD-10-CM | POA: Diagnosis not present

## 2021-01-14 DIAGNOSIS — Z87891 Personal history of nicotine dependence: Secondary | ICD-10-CM | POA: Diagnosis not present

## 2021-01-14 DIAGNOSIS — Z881 Allergy status to other antibiotic agents status: Secondary | ICD-10-CM | POA: Diagnosis not present

## 2021-01-14 DIAGNOSIS — H209 Unspecified iridocyclitis: Secondary | ICD-10-CM | POA: Diagnosis not present

## 2021-01-14 DIAGNOSIS — Z96651 Presence of right artificial knee joint: Secondary | ICD-10-CM | POA: Diagnosis not present

## 2021-01-14 DIAGNOSIS — I10 Essential (primary) hypertension: Secondary | ICD-10-CM | POA: Diagnosis not present

## 2021-01-14 DIAGNOSIS — Z91013 Allergy to seafood: Secondary | ICD-10-CM | POA: Diagnosis not present

## 2021-01-14 DIAGNOSIS — T85398A Other mechanical complication of other ocular prosthetic devices, implants and grafts, initial encounter: Secondary | ICD-10-CM | POA: Diagnosis not present

## 2021-01-14 DIAGNOSIS — E119 Type 2 diabetes mellitus without complications: Secondary | ICD-10-CM | POA: Diagnosis not present

## 2021-01-14 DIAGNOSIS — H4042X0 Glaucoma secondary to eye inflammation, left eye, stage unspecified: Secondary | ICD-10-CM | POA: Diagnosis not present

## 2021-01-14 DIAGNOSIS — Z7984 Long term (current) use of oral hypoglycemic drugs: Secondary | ICD-10-CM | POA: Diagnosis not present

## 2021-01-14 DIAGNOSIS — I252 Old myocardial infarction: Secondary | ICD-10-CM | POA: Diagnosis not present

## 2021-01-14 DIAGNOSIS — Z79899 Other long term (current) drug therapy: Secondary | ICD-10-CM | POA: Diagnosis not present

## 2021-01-14 HISTORY — PX: EYE SURGERY: SHX253

## 2021-01-14 HISTORY — PX: IRIDOTOMY / IRIDECTOMY: SHX165

## 2021-01-15 ENCOUNTER — Encounter (INDEPENDENT_AMBULATORY_CARE_PROVIDER_SITE_OTHER): Payer: Medicare Other | Admitting: Ophthalmology

## 2021-02-03 NOTE — Progress Notes (Shared)
[facility] Note  02/11/2021     CHIEF COMPLAINT Patient presents for No chief complaint on file.   HISTORY OF PRESENT ILLNESS: Christopher Randolph is a 75 y.o. male who presents to the clinic today for:     Referring physician: Pleas Koch Virgina Evener, MD [place],  [place] [address]  HISTORICAL INFORMATION:   Selected notes from the MEDICAL RECORD NUMBER Referred by Dr. [name] for concern of vitreous hemorrhage   CURRENT MEDICATIONS: Current Outpatient Medications (Ophthalmic Drugs)  Medication Sig  . bacitracin-polymyxin b (POLYSPORIN) ophthalmic ointment Place into the left eye at bedtime. Place a 1/2 inch ribbon of ointment into the lower eyelid at bedtime and as needed (Patient not taking: Reported on 08/28/2020)  . brimonidine (ALPHAGAN) 0.2 % ophthalmic solution Place 1 drop into the left eye 3 (three) times daily.  . Bromfenac Sodium (PROLENSA) 0.07 % SOLN Place 1 drop into the left eye 4 (four) times daily.  . dorzolamide-timolol (COSOPT) 22.3-6.8 MG/ML ophthalmic solution Place 1 drop into the left eye 2 (two) times daily.  Marland Kitchen ketorolac (ACULAR) 0.5 % ophthalmic solution Place 1 drop into the left eye 3 (three) times daily. (Patient not taking: Reported on 08/28/2020)  . loteprednol (LOTEMAX) 0.5 % ophthalmic suspension Place 1 drop into the left eye 4 (four) times daily. (Patient not taking: Reported on 08/28/2020)  . Loteprednol Etabonate (INVELTYS) 1 % SUSP Apply 1 drop to eye 2 (two) times a day. 1 drop 2 times daily left eye  . Loteprednol Etabonate (LOTEMAX SM) 0.38 % GEL Place 1 drop into the left eye 4 (four) times daily.  . Netarsudil-Latanoprost (ROCKLATAN) 0.02-0.005 % SOLN Place 1 drop into the left eye at bedtime.   . prednisoLONE acetate (PRED FORTE) 1 % ophthalmic suspension Place 1 drop into the left eye 3 (three) times daily. (Patient not taking: Reported on 08/28/2020)   No current facility-administered medications for this visit.  (Ophthalmic Drugs)   Current Outpatient Medications (Other)  Medication Sig  . amLODipine (NORVASC) 5 MG tablet Take 5 mg by mouth daily.  Marland Kitchen aspirin EC 81 MG tablet Take 81 mg by mouth daily.    Marland Kitchen atorvastatin (LIPITOR) 20 MG tablet TAKE 1 TABLET BY MOUTH DAILY  . fluticasone (FLONASE ALLERGY RELIEF) 50 MCG/ACT nasal spray Place 2 sprays into both nostrils daily.   Marland Kitchen levothyroxine (SYNTHROID, LEVOTHROID) 150 MCG tablet Take 150 mcg by mouth See admin instructions. Alternates taking 150 mg daily for 2 days then 175 mcg the third day then repeat  . levothyroxine (SYNTHROID, LEVOTHROID) 175 MCG tablet Take 175 mcg by mouth See admin instructions. Alternates taking 150 mg daily for 2 days then 175 mcg the third day then repeat  . metFORMIN (GLUCOPHAGE) 500 MG tablet Take 500 mg by mouth daily.   . metoprolol tartrate (LOPRESSOR) 25 MG tablet Take 25 mg by mouth 2 (two) times daily.   Marland Kitchen MITIGARE 0.6 MG CAPS TcT BY MOUTH AT ONE TIME, THEN TAKE 1 CAPSULE ONE HOUR. TAKE AS NEEDED FOR FOR GOUT ATTACKS  . mometasone (ELOCON) 0.1 % ointment Apply 1 application topically daily.   . naproxen sodium (ANAPROX) 220 MG tablet Take 220 mg by mouth daily as needed (for pain.).   Marland Kitchen nitroGLYCERIN (NITROSTAT) 0.4 MG SL tablet DISSOLVE 1 TABLET UNDER THE TONGUE EVERY 5 MINUTES AS NEEDED FOR CHEST PAIN. DO NOT EXCEED A TOTAL OF 3 DOSES IN 15 MINUTES.  . tamsulosin (FLOMAX) 0.4  MG CAPS capsule Take 0.4 mg by mouth 2 (two) times daily.  Marland Kitchen telmisartan (MICARDIS) 80 MG tablet TAKE 1 TABLET BY MOUTH EVERY DAY   No current facility-administered medications for this visit. (Other)      REVIEW OF SYSTEMS:    ALLERGIES Allergies  Allergen Reactions  . Azithromycin Swelling  . Shrimp [Shellfish Allergy] Other (See Comments)    Gout flares    PAST MEDICAL HISTORY Past Medical History:  Diagnosis Date  . Arthritis   . CAD (coronary artery disease)    Branch vessel and moderate RCA disease 2006  . Cancer ([place])  1990   Melanoma Lower right Leg  . Carotid artery disease ([place])   . Cataracts, bilateral   . Essential hypertension   . Glaucoma   . Hyperlipidemia   . Hypothyroidism   . Lyme disease   . PAD (peripheral artery disease) ([place])    Left common iliac stent 2004  . Pneumonia    x 1  . PONV (postoperative nausea and vomiting)   . Type 2 diabetes mellitus ([place])   . Wears dentures   . Wears glasses    Past Surgical History:  Procedure Laterality Date  . CATARACT EXTRACTION Bilateral   . CATARACT EXTRACTION, BILATERAL    . CERVICAL DISC SURGERY     x 1  . COLONOSCOPY N/A 11/06/2016   Procedure: COLONOSCOPY;  Surgeon: Daneil Dolin, MD;  [facility];  Service: Endoscopy;  Laterality: N/A;  7:30 AM  . COLONOSCOPY  2012   Dr. Anthony Sar: normal. reviewed reports, which states he has a history of polyps in remote past.   . EYE SURGERY    . JOINT REPLACEMENT    . LUMBAR WOUND DEBRIDEMENT N/A 01/24/2019   Procedure: LUMBAR WOUND Exploration for Evacation of Seroma vs. Hematoma;  Surgeon: Jovita Gamma, MD;  Location: [place];  Service: Neurosurgery;  Laterality: N/A;  . MELANOMA EXCISION     right leg, seen at [facility] and underwent immunotherapy  . NECK SURGERY      x 1 yrs ago  . PARS PLANA VITRECTOMY Left 04/04/2020   Procedure: PARS PLANA VITRECTOMY WITH 25G REMOVAL/SUTURE INTRAOCULAR LENS;  Surgeon: [name], MD;  Location: [facility];  Service: Ophthalmology;  Laterality: Left;  . REPLACEMENT TOTAL KNEE Right   . WISDOM TOOTH EXTRACTION      FAMILY HISTORY Family History  Problem Relation Age of Onset  . Heart disease Mother   . Heart attack Mother   . Colon polyps Mother   . Heart attack Maternal Grandmother   . Heart attack Maternal Grandfather   . Colon polyps Brother   . Colon cancer Neg Hx     SOCIAL HISTORY Social History   Tobacco Use  . Smoking status: Former Smoker    Packs/day: 1.50    Years: 40.00    Pack years: 60.00    Types: Cigarettes    Start  date: 11/04/1963    Quit date: 10/29/2003    Years since quitting: 17.2  . Smokeless tobacco: Never Used  Vaping Use  . Vaping Use: Never used  Substance Use Topics  . Alcohol use: Yes    Alcohol/week: 7.0 standard drinks    Types: 7 Standard drinks or equivalent per week    Comment: one miller lite daily   . Drug use: No         OPHTHALMIC EXAM:  Not recorded     IMAGING AND PROCEDURES  Imaging and Procedures  for @TODAY @           ASSESSMENT/PLAN:  No diagnosis found. 1-4. UGH Syndrome OS  - 3 piece PCIOL OS centered, but iris has large TID in sup temp quadrant with IOL haptic within area  - tmax at [facility] 39 OS and was started on max drops (Cosopt, Brim, latan, and po diamox)  - repeat gonio showed open angles OS, mild focal PAS  - s/p PPV w/ IOL exchange / sutured secondary Akreos OS IOL 04.08.21             - BCVA OS 20/40 stable, back to baseline  - OCT shows stable resolution of CME OS (see below)             - IOP 21 today, 08.18.21 -- Rocklatan added by Dr. Shirleen Schirmer  - continue IOP drops per Dr. [name] -- appreciate his expert care  - Cosopt BID OS  - Brim TID OS  - Rocklatan QHS OS  - per pt, Dr. [name] and he are contemplating glaucoma surgery OS  - f/u 3 mos  5,6. CME OS  - mild CME that was improved on 7.21.21 -- stopped Prolensa and started Lotemax taper  - interval re-development of CME (08.18.21)  - s/p STK OS (08.18.21)  - today, CME stably resolved on exam and OCT  - BCVA 20/40 from 20/60   - completed Lotemax SM and Prolensa taper  - f/u 3 months, DFE, OCT  7. Pseudophakia OU  - s/p CE/IOL OU -- pt can't remember dates, one eye by [facility], other eye by [facility]  - h/o UGH OS and now sutured IOL as above  - PCIOL OD appears to be in good position  - monitor  8. Lyme disease  - history of diagnosis from target lesions on lower extremities  - no blood test confirmation performed  - completed doxycycline per PCP office  -  no manifestations in the eye, but will continue to monitor  9,10. Brow ptosis, dermatochalasis OU  - referred to Dr. Vickki Muff (now at [facility]) for eyelid eval  - holding on repair for now     Ophthalmic Meds Ordered this visit:  No orders of the defined types were placed in this encounter.      No follow-ups on file.  There are no Patient Instructions on file for this visit.  This document serves as a record of services personally performed by Gardiner Sleeper, MD, PhD. It was created on their behalf by Estill Bakes, COT an ophthalmic technician. The creation of this record is the provider's dictation and/or activities during the visit.    Electronically signed by: Estill Bakes, COT 2.7.22 @ 9:49 AM  Abbreviations: M myopia (nearsighted); A astigmatism; H hyperopia (farsighted); P presbyopia; Mrx spectacle prescription;  CTL contact lenses; OD right eye; OS left eye; OU both eyes  XT exotropia; ET esotropia; PEK punctate epithelial keratitis; PEE punctate epithelial erosions; DES dry eye syndrome; MGD meibomian gland dysfunction; ATs artificial tears; PFAT's preservative free artificial tears; [place] nuclear sclerotic cataract; PSC posterior subcapsular cataract; ERM epi-retinal membrane; PVD posterior vitreous detachment; RD retinal detachment; DM diabetes mellitus; DR diabetic retinopathy; NPDR non-proliferative diabetic retinopathy; PDR proliferative diabetic retinopathy; CSME clinically significant macular edema; DME diabetic macular edema; dbh dot blot hemorrhages; CWS cotton wool spot; POAG primary open angle glaucoma; C/D cup-to-disc ratio; HVF humphrey visual field; GVF goldmann visual field; OCT optical coherence tomography; IOP intraocular pressure; BRVO Branch retinal vein occlusion;  CRVO central retinal vein occlusion; CRAO central retinal artery occlusion; BRAO branch retinal artery occlusion; RT retinal tear; SB scleral buckle; PPV pars plana vitrectomy; VH Vitreous hemorrhage; PRP  panretinal laser photocoagulation; IVK intravitreal kenalog; VMT vitreomacular traction; MH Macular hole;  NVD neovascularization of the disc; NVE neovascularization elsewhere; AREDS age related eye disease study; ARMD age related macular degeneration; POAG primary open angle glaucoma; EBMD epithelial/anterior basement membrane dystrophy; ACIOL anterior chamber intraocular lens; IOL intraocular lens; PCIOL posterior chamber intraocular lens; Phaco/IOL phacoemulsification with intraocular lens placement; [place] photorefractive keratectomy; LASIK laser assisted in situ keratomileusis; HTN hypertension; DM diabetes mellitus; COPD chronic obstructive pulmonary disease

## 2021-02-05 NOTE — Progress Notes (Signed)
Cardiology Office Note  Date: 02/06/2021   ID: Christopher Randolph, DOB 01-Apr-1946, MRN [id number]  PCP:  Curlene Labrum, MD  Cardiologist:  Rozann Lesches, MD Electrophysiologist:  None   Chief Complaint  Patient presents with  . Cardiac follow-up    History of Present Illness: Christopher Randolph is a 75 y.o. male last seen in August 2021.  He presents for a routine visit.  Reports no active angina or nitroglycerin use, doing well with his current medications which we reviewed below.  No significant change in cardiac regimen.  Follow-up carotid Dopplers in September 2021 revealed moderate bilateral ICA stenosis, right greater than left.  He remains asymptomatic and is on aspirin and Lipitor.  He will have follow-up carotid Dopplers this September.  I asked him about last follow-up lab work with Dr. Pleas Koch.  He thinks this was in November 2021, we will request the results for review.  I personally reviewed his ECG today which shows sinus rhythm.  Past Medical History:  Diagnosis Date  . Arthritis   . CAD (coronary artery disease)    Branch vessel and moderate RCA disease 2006  . Cancer ([place]) 1990   Melanoma Lower right Leg  . Carotid artery disease ([place])   . Cataracts, bilateral   . Essential hypertension   . Glaucoma   . Hyperlipidemia   . Hypothyroidism   . Lyme disease   . PAD (peripheral artery disease) ([place])    Left common iliac stent 2004  . Pneumonia    x 1  . PONV (postoperative nausea and vomiting)   . Type 2 diabetes mellitus ([place])   . Wears dentures   . Wears glasses     Past Surgical History:  Procedure Laterality Date  . CATARACT EXTRACTION Bilateral   . CATARACT EXTRACTION, BILATERAL    . CERVICAL DISC SURGERY     x 1  . COLONOSCOPY N/A 11/06/2016   Procedure: COLONOSCOPY;  Surgeon: Daneil Dolin, MD;  [facility];  Service: Endoscopy;  Laterality: N/A;  7:30 AM  . COLONOSCOPY  2012   Dr. Anthony Sar: normal. reviewed reports, which  states he has a history of polyps in remote past.   . EYE SURGERY    . JOINT REPLACEMENT    . LUMBAR WOUND DEBRIDEMENT N/A 01/24/2019   Procedure: LUMBAR WOUND Exploration for Evacation of Seroma vs. Hematoma;  Surgeon: Jovita Gamma, MD;  Location: [facility];  Service: Neurosurgery;  Laterality: N/A;  . MELANOMA EXCISION     right leg, seen at [facility] and underwent immunotherapy  . NECK SURGERY      x 1 yrs ago  . PARS PLANA VITRECTOMY Left 04/04/2020   Procedure: PARS PLANA VITRECTOMY WITH 25G REMOVAL/SUTURE INTRAOCULAR LENS;  Surgeon: [name], MD;  Location: [facility];  Service: Ophthalmology;  Laterality: Left;  . REPLACEMENT TOTAL KNEE Right   . WISDOM TOOTH EXTRACTION      Current Outpatient Medications  Medication Sig Dispense Refill  . amLODipine (NORVASC) 5 MG tablet Take 5 mg by mouth daily.    Marland Kitchen aspirin EC 81 MG tablet Take 81 mg by mouth daily.      Marland Kitchen atorvastatin (LIPITOR) 20 MG tablet TAKE 1 TABLET BY MOUTH DAILY 90 tablet 1  . atropine 1 % ophthalmic solution Place one drop into the left eye 2 (two) times daily for 14 days. Do not take tonight.  Dr. Ander Slade will evaluate your post-operative status tomorrow and may make changes to  the duration and dose of this medication.    . cyclobenzaprine (FLEXERIL) 10 MG tablet Take 10 mg by mouth every 8 (eight) hours as needed.    . fluticasone (FLONASE) 50 MCG/ACT nasal spray Place 2 sprays into both nostrils daily.    Marland Kitchen levothyroxine (SYNTHROID, LEVOTHROID) 150 MCG tablet Take 150 mcg by mouth See admin instructions. Alternates taking 150 mg daily for 2 days then 175 mcg the third day then repeat  1  . Loteprednol Etabonate (LOTEMAX SM) 0.38 % GEL Place 1 drop into the left eye 4 (four) times daily. 5 g 2  . metFORMIN (GLUCOPHAGE) 500 MG tablet Take 500 mg by mouth daily.    . metoprolol tartrate (LOPRESSOR) 25 MG tablet Take 25 mg by mouth 2 (two) times daily.     Marland Kitchen MITIGARE 0.6 MG CAPS TcT BY MOUTH AT ONE TIME, THEN TAKE 1 CAPSULE ONE  HOUR. TAKE AS NEEDED FOR FOR GOUT ATTACKS    . mometasone (ELOCON) 0.1 % ointment Apply 1 application topically daily.    . naproxen sodium (ANAPROX) 220 MG tablet Take 220 mg by mouth daily as needed (for pain.).     Marland Kitchen nitroGLYCERIN (NITROSTAT) 0.4 MG SL tablet DISSOLVE 1 TABLET UNDER THE TONGUE EVERY 5 MINUTES AS NEEDED FOR CHEST PAIN. DO NOT EXCEED A TOTAL OF 3 DOSES IN 15 MINUTES. 25 tablet 3  . tamsulosin (FLOMAX) 0.4 MG CAPS capsule Take 0.4 mg by mouth 2 (two) times daily.    Marland Kitchen telmisartan (MICARDIS) 80 MG tablet TAKE 1 TABLET BY MOUTH EVERY DAY 90 tablet 1  . levothyroxine (SYNTHROID, LEVOTHROID) 175 MCG tablet Take 175 mcg by mouth See admin instructions. Alternates taking 150 mg daily for 2 days then 175 mcg the third day then repeat     No current facility-administered medications for this visit.   Allergies:  Azithromycin and Shrimp [shellfish allergy]   ROS: Arthritic pains.  Physical Exam: VS:  BP 128/70   Pulse 64   Ht 5\' 8"  (1.727 m)   Wt 179 lb (81.2 kg)   SpO2 95%   BMI 27.22 kg/m , BMI Body mass index is 27.22 kg/m.  Wt Readings from Last 3 Encounters:  02/06/21 179 lb (81.2 kg)  08/01/20 184 lb (83.5 kg)  05/16/20 175 lb (79.4 kg)    General: Patient appears comfortable at rest. HEENT: Conjunctiva and lids normal, wearing a mask. Neck: Supple, no elevated JVP or carotid bruits, no thyromegaly. Lungs: Clear to auscultation, nonlabored breathing at rest. Cardiac: Regular rate and rhythm, no S3 or significant systolic murmur, no pericardial rub. Extremities: No pitting edema.  ECG:  An ECG dated 01/30/2020 was personally reviewed today and demonstrated:  Sinus rhythm with low voltage and nonspecific ST-T changes.  Recent Labwork: 04/04/2020: BUN 17; Creatinine, Ser 0.95; Hemoglobin 14.5; Potassium 3.8; Sodium 144   Other Studies Reviewed Today:  Echocardiogram 12/06/2017([facility]): Mild LVH with LVEF 55 to 33%, normal diastolic function, mild left  atrial enlargement, normal right ventricular contraction, no major valvular abnormalities.  Carotid Dopplers 08/28/2020: Summary:  Right Carotid: Velocities in the right ICA are consistent with a 60-79%         stenosis.   Left Carotid: Velocities in the left ICA are consistent with a 40-59%  stenosis.        Upper end of scale.   Vertebrals: Bilateral vertebral arteries demonstrate antegrade flow.  Subclavians: Normal flow hemodynamics were seen in bilateral subclavian  arteries.   Assessment and Plan:  1.  Symptomatically stable CAD, predominantly branch vessel as well as moderate RCA disease.  ECG reviewed and stable.  Continue aspirin, Norvasc, Lipitor, Lopressor, telmisartan, and as needed nitroglycerin.  2.  Asymptomatic, moderate bilateral ICA stenosis, right greater than left.  Continue aspirin and Lipitor with follow-up carotid Dopplers in September.  3.  Mixed hyperlipidemia, continues on Lipitor.  Requesting lab work from Dr. Pleas Koch.  Medication Adjustments/Labs and Tests Ordered: Current medicines are reviewed at length with the patient today.  Concerns regarding medicines are outlined above.   Tests Ordered: Orders Placed This Encounter  Procedures  . EKG 12-Lead    Medication Changes: No orders of the defined types were placed in this encounter.   Disposition:  Follow up 6 months in the [place] office.  Signed, Satira Sark, MD, [facility] 02/06/2021 8:44 AM    [facility] [place], [place], [place] [address] Phone: [phone]; Fax: [phone]

## 2021-02-06 ENCOUNTER — Ambulatory Visit (INDEPENDENT_AMBULATORY_CARE_PROVIDER_SITE_OTHER): Payer: Medicare Other | Admitting: Cardiology

## 2021-02-06 ENCOUNTER — Encounter: Payer: Self-pay | Admitting: Cardiology

## 2021-02-06 ENCOUNTER — Encounter: Payer: Self-pay | Admitting: *Deleted

## 2021-02-06 VITALS — BP 128/70 | HR 64 | Ht 68.0 in | Wt 179.0 lb

## 2021-02-06 DIAGNOSIS — E782 Mixed hyperlipidemia: Secondary | ICD-10-CM | POA: Diagnosis not present

## 2021-02-06 DIAGNOSIS — I25119 Atherosclerotic heart disease of native coronary artery with unspecified angina pectoris: Secondary | ICD-10-CM

## 2021-02-06 DIAGNOSIS — I6523 Occlusion and stenosis of bilateral carotid arteries: Secondary | ICD-10-CM | POA: Diagnosis not present

## 2021-02-06 NOTE — Patient Instructions (Signed)

## 2021-02-11 ENCOUNTER — Encounter (INDEPENDENT_AMBULATORY_CARE_PROVIDER_SITE_OTHER): Payer: Medicare Other | Admitting: Ophthalmology

## 2021-02-11 DIAGNOSIS — H3581 Retinal edema: Secondary | ICD-10-CM

## 2021-02-11 DIAGNOSIS — H02831 Dermatochalasis of right upper eyelid: Secondary | ICD-10-CM

## 2021-02-11 DIAGNOSIS — H401321 Pigmentary glaucoma, left eye, mild stage: Secondary | ICD-10-CM

## 2021-02-11 DIAGNOSIS — A6920 Lyme disease, unspecified: Secondary | ICD-10-CM

## 2021-02-11 DIAGNOSIS — H35352 Cystoid macular degeneration, left eye: Secondary | ICD-10-CM

## 2021-02-11 DIAGNOSIS — H57819 Brow ptosis, unspecified: Secondary | ICD-10-CM

## 2021-02-11 DIAGNOSIS — Z961 Presence of intraocular lens: Secondary | ICD-10-CM

## 2021-02-11 DIAGNOSIS — H209 Unspecified iridocyclitis: Secondary | ICD-10-CM

## 2021-03-02 DIAGNOSIS — S62002A Unspecified fracture of navicular [scaphoid] bone of left wrist, initial encounter for closed fracture: Secondary | ICD-10-CM | POA: Diagnosis not present

## 2021-03-02 DIAGNOSIS — S52125A Nondisplaced fracture of head of left radius, initial encounter for closed fracture: Secondary | ICD-10-CM | POA: Diagnosis not present

## 2021-03-02 DIAGNOSIS — W11XXXA Fall on and from ladder, initial encounter: Secondary | ICD-10-CM | POA: Diagnosis not present

## 2021-03-02 DIAGNOSIS — S42402A Unspecified fracture of lower end of left humerus, initial encounter for closed fracture: Secondary | ICD-10-CM | POA: Diagnosis not present

## 2021-03-02 DIAGNOSIS — S60222A Contusion of left hand, initial encounter: Secondary | ICD-10-CM | POA: Diagnosis not present

## 2021-03-02 DIAGNOSIS — M25422 Effusion, left elbow: Secondary | ICD-10-CM | POA: Diagnosis not present

## 2021-03-02 DIAGNOSIS — S50312A Abrasion of left elbow, initial encounter: Secondary | ICD-10-CM | POA: Diagnosis not present

## 2021-03-02 DIAGNOSIS — M19032 Primary osteoarthritis, left wrist: Secondary | ICD-10-CM | POA: Diagnosis not present

## 2021-03-02 DIAGNOSIS — S60212A Contusion of left wrist, initial encounter: Secondary | ICD-10-CM | POA: Diagnosis not present

## 2021-03-04 DIAGNOSIS — M19042 Primary osteoarthritis, left hand: Secondary | ICD-10-CM | POA: Diagnosis not present

## 2021-03-04 DIAGNOSIS — S52132A Displaced fracture of neck of left radius, initial encounter for closed fracture: Secondary | ICD-10-CM | POA: Diagnosis not present

## 2021-03-10 ENCOUNTER — Other Ambulatory Visit: Payer: Self-pay | Admitting: Cardiology

## 2021-03-10 NOTE — Progress Notes (Signed)
[facility] Note  03/12/2021     CHIEF COMPLAINT Patient presents for Retina Follow Up   HISTORY OF PRESENT ILLNESS: Christopher Randolph is a 75 y.o. male who presents to the clinic today for:   HPI    Retina Follow Up    Patient presents with  Other.  In left eye.  This started years ago.  Severity is moderate.  Duration of 5 months.  Since onset it is stable.          Comments    75 y/o male pt here for 5 mo f/u for UGH Syndrome w/CME OS.  No change in [place] OU.  Denies pain, FOL, floaters.  Had shunt sx OS for [place] w/Dr. Jovita Kussmaul at [facility] on 1.18.22.  Pt saw Dr. Ander Slade a few days ago, and states his IOPS were then around 9 OU.  Cosopt and Brimonidine BID OS.       Last edited by Matthew Folks, COA on 03/12/2021  9:21 AM. (History)    pt had an ahmed valve OS placed by Dr. Ander Slade on 01.18.22, IOP was 9,10 in Dr. Duayne Cal office on Monday, pt is using brimonidine and Prolensa BID OS only  Referring physician: [name], MD [address] [place],  [place] [id number]  HISTORICAL INFORMATION:   Selected notes from the MEDICAL RECORD NUMBER Referred by Dr. [name] for concern of vitreous hemorrhage   CURRENT MEDICATIONS: Current Outpatient Medications (Ophthalmic Drugs)  Medication Sig  . brimonidine (ALPHAGAN) 0.2 % ophthalmic solution INSTILL ONE DROP INTO LEFT EYE THREE TIMES DAILY  . dorzolamide-timolol (COSOPT) 22.3-6.8 MG/ML ophthalmic solution Apply to eye.  . Loteprednol Etabonate (LOTEMAX SM) 0.38 % GEL Place 1 drop into the left eye 4 (four) times daily.  Marland Kitchen atropine 1 % ophthalmic solution Place one drop into the left eye 2 (two) times daily for 14 days. Do not take tonight.  Dr. Ander Slade will evaluate your post-operative status tomorrow and may make changes to the duration and dose of this medication. (Patient not taking: Reported on 03/12/2021)   No current facility-administered medications for this visit. (Ophthalmic Drugs)    Current Outpatient Medications (Other)  Medication Sig  . amLODipine (NORVASC) 5 MG tablet Take 5 mg by mouth daily.  Marland Kitchen aspirin EC 81 MG tablet Take 81 mg by mouth daily.    Marland Kitchen atorvastatin (LIPITOR) 20 MG tablet TAKE 1 TABLET BY MOUTH DAILY  . cyclobenzaprine (FLEXERIL) 10 MG tablet Take 10 mg by mouth every 8 (eight) hours as needed.  . fluticasone (FLONASE) 50 MCG/ACT nasal spray Place 2 sprays into both nostrils daily.  Marland Kitchen HYDROcodone-acetaminophen (NORCO/VICODIN) 5-325 MG tablet   . levothyroxine (SYNTHROID, LEVOTHROID) 150 MCG tablet Take 150 mcg by mouth See admin instructions. Alternates taking 150 mg daily for 2 days then 175 mcg the third day then repeat  . levothyroxine (SYNTHROID, LEVOTHROID) 175 MCG tablet Take 175 mcg by mouth See admin instructions. Alternates taking 150 mg daily for 2 days then 175 mcg the third day then repeat  . metFORMIN (GLUCOPHAGE) 500 MG tablet Take 500 mg by mouth daily.  . methazolamide (NEPTAZANE) 50 MG tablet Take by mouth.  . metoprolol tartrate (LOPRESSOR) 25 MG tablet Take 25 mg by mouth 2 (two) times daily.   Marland Kitchen MITIGARE 0.6 MG CAPS TcT BY MOUTH AT ONE TIME, THEN TAKE 1 CAPSULE ONE HOUR. TAKE AS NEEDED FOR FOR GOUT ATTACKS  . mometasone (ELOCON) 0.1 %  ointment Apply 1 application topically daily.  . naproxen sodium (ANAPROX) 220 MG tablet Take 220 mg by mouth daily as needed (for pain.).   Marland Kitchen nitroGLYCERIN (NITROSTAT) 0.4 MG SL tablet DISSOLVE 1 TABLET UNDER THE TONGUE EVERY 5 MINUTES AS NEEDED FOR CHEST PAIN. DO NOT EXCEED A TOTAL OF 3 DOSES IN 15 MINUTES.  . tamsulosin (FLOMAX) 0.4 MG CAPS capsule Take 0.4 mg by mouth 2 (two) times daily.  Marland Kitchen telmisartan (MICARDIS) 80 MG tablet TAKE 1 TABLET BY MOUTH EVERY DAY   No current facility-administered medications for this visit. (Other)      REVIEW OF SYSTEMS: ROS    Positive for: Musculoskeletal, Cardiovascular, Eyes   Negative for: Constitutional, Gastrointestinal, Neurological, Skin,  Genitourinary, HENT, Endocrine, Respiratory, Psychiatric, Allergic/Imm, Heme/Lymph   Last edited by Matthew Folks, COA on 03/12/2021  9:19 AM. (History)       ALLERGIES Allergies  Allergen Reactions  . Azithromycin Swelling  . Shrimp [Shellfish Allergy] Other (See Comments)    Gout flares    PAST MEDICAL HISTORY Past Medical History:  Diagnosis Date  . Arthritis   . CAD (coronary artery disease)    Branch vessel and moderate RCA disease 2006  . Cancer ([place]) 1990   Melanoma Lower right Leg  . Carotid artery disease ([place])   . Essential hypertension   . Glaucoma   . Hyperlipidemia   . Hypothyroidism   . Lyme disease   . PAD (peripheral artery disease) ([place])    Left common iliac stent 2004  . Pneumonia    x 1  . PONV (postoperative nausea and vomiting)   . Type 2 diabetes mellitus ([place])   . Wears dentures   . Wears glasses    Past Surgical History:  Procedure Laterality Date  . CATARACT EXTRACTION Bilateral   . CATARACT EXTRACTION, BILATERAL    . CERVICAL DISC SURGERY     x 1  . COLONOSCOPY N/A 11/06/2016   Procedure: COLONOSCOPY;  Surgeon: Daneil Dolin, MD;  [facility];  Service: Endoscopy;  Laterality: N/A;  7:30 AM  . COLONOSCOPY  2012   Dr. Anthony Sar: normal. reviewed reports, which states he has a history of polyps in remote past.   . EYE SURGERY Bilateral    Cat Sx  . EYE SURGERY Left 01/14/2021   Shunt - Dr. Jovita Kussmaul  . IRIDOTOMY / IRIDECTOMY Left 01/14/2021   Shunt - Dr. Jovita Kussmaul  . JOINT REPLACEMENT    . LUMBAR WOUND DEBRIDEMENT N/A 01/24/2019   Procedure: LUMBAR WOUND Exploration for Evacation of Seroma vs. Hematoma;  Surgeon: Jovita Gamma, MD;  Location: [place];  Service: Neurosurgery;  Laterality: N/A;  . MELANOMA EXCISION     right leg, seen at [facility] and underwent immunotherapy  . NECK SURGERY      x 1 yrs ago  . PARS PLANA VITRECTOMY Left 04/04/2020   Procedure: PARS PLANA VITRECTOMY WITH 25G REMOVAL/SUTURE INTRAOCULAR LENS;   Surgeon: [name], MD;  Location: [place];  Service: Ophthalmology;  Laterality: Left;  . REPLACEMENT TOTAL KNEE Right   . WISDOM TOOTH EXTRACTION      FAMILY HISTORY Family History  Problem Relation Age of Onset  . Heart disease Mother   . Heart attack Mother   . Colon polyps Mother   . Heart attack Maternal Grandmother   . Heart attack Maternal Grandfather   . Colon polyps Brother   . Colon cancer Neg Hx     SOCIAL HISTORY Social  History   Tobacco Use  . Smoking status: Former Smoker    Packs/day: 1.50    Years: 40.00    Pack years: 60.00    Types: Cigarettes    Start date: 11/04/1963    Quit date: 10/29/2003    Years since quitting: 17.3  . Smokeless tobacco: Never Used  Vaping Use  . Vaping Use: Never used  Substance Use Topics  . Alcohol use: Yes    Alcohol/week: 7.0 standard drinks    Types: 7 Standard drinks or equivalent per week    Comment: one miller lite daily   . Drug use: No         OPHTHALMIC EXAM:  Base Eye Exam    Visual Acuity (Snellen - Linear)      Right Left   Dist cc 20/20 20/100 -2   Dist ph cc  20/50   Correction: Glasses       Tonometry (Tonopen, 9:25 AM)      Right Left   Pressure 18 19       Pupils      Dark Light Shape React APD   Right 3 2 Round Brisk None   Left 3 2 Round Brisk None       Visual Fields (Counting fingers)      Left Right    Full Full       Extraocular Movement      Right Left    Full, Ortho Full, Ortho       Neuro/Psych    Oriented x3: Yes   Mood/Affect: Normal       Dilation    Both eyes: 1.0% Mydriacyl, 2.5% Phenylephrine @ 9:25 AM        Slit Lamp and Fundus Exam    External Exam      Right Left   External brow ptosis brow ptosis; mild periorbital edema and erythema       Slit Lamp Exam      Right Left   Lids/Lashes Dermatochalasis - upper lid, Telangiectasia, mild Meibomian gland dysfunction Dermatochalasis, mild Ptosis, Meibomian gland dysfunction   Conjunctiva/Sclera  superior and temporal Pinguecula temporal Pinguecula, ahmed plate and scleral patch graft ST quad -- nicely healed, bled ST conj   Cornea mild arcus, trace Punctate epithelial erosions, Debris in tear film mild arcus, trace Punctate epithelial erosions, well healed superior cataract wound, mild Debris in tear film   Anterior Chamber Deep and quiet, no cell/flare Deep, tube at 0100, No cell or flare   Iris No NVI, Round and dilated No NVI, moderately dilated to 4.73mm, Transillumination defects from 0100-0200   Lens Posterior chamber intraocular lens in good position Sutured Akreos IOL in good position   Vitreous vitreous syneresis post vitrectomy, clear       Fundus Exam      Right Left   Disc Pink and Sharp Pink and Sharp, Compact   C/D Ratio 0.2 0.3   Macula Flat, mild RPE mottling, No heme or edema Flat. blunted foveal reflex, stable resolution of central CME, mild RPE mottling, No heme   Vessels normal, mildly Tortuous Vascular attenuation, mild Tortuousity   Periphery Attached, round focal area of CR atrophy at 0630 mid zone Attached          IMAGING AND PROCEDURES  Imaging and Procedures for @TODAY @  OCT, Retina - OU - Both Eyes       Right Eye Quality was good. Central Foveal Thickness: 273. Progression has been stable. Findings include normal  foveal contour, no SRF, no IRF (partial PVD - stable).   Left Eye Quality was good. Central Foveal Thickness: 302. Progression has been stable. Findings include normal foveal contour, no IRF, no SRF (Stable resolution of CME).   Notes *Images captured and stored on drive  Diagnosis / Impression:  OD: NFP, no IRF, no SRF; partial PVD OS: stable resolution of CME  Clinical management:  See below  Abbreviations: NFP - Normal foveal profile. CME - cystoid macular edema. PED - pigment epithelial detachment. IRF - intraretinal fluid. SRF - subretinal fluid. EZ - ellipsoid zone. ERM - epiretinal membrane. ORA - outer retinal atrophy.  ORT - outer retinal tubulation. 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 Uveitis-hyphema-glaucoma syndrome of left eye  T85.398A    H20.9    H40.42X0   3. Pigmentary glaucoma of left eye, mild stage  H40.1321   4. Dislocated IOL (intraocular lens), anterior, left  H27.122   5. Cystoid macular edema of left eye  H35.352   6. Retinal edema  H35.81 OCT, Retina - OU - Both Eyes  7. Pseudophakia of both eyes  Z96.1   8. Lyme disease  A69.20   9. Dermatochalasis of both upper eyelids  H02.831    H02.834   10. Brow ptosis  H57.819    1-4. UGH Syndrome OS  - 3 piece PCIOL OS centered, but iris has large TID in sup temp quadrant with IOL haptic within area  - tmax at [facility] 39 OS and was started on max drops (Cosopt, Brim, latan, and po diamox)  - repeat gonio showed open angles OS, mild focal PAS  - s/p PPV w/ IOL exchange / sutured secondary Akreos OS IOL 04.08.21             - BCVA OS 20/50 stable, back to baseline  - OCT shows stable resolution of CME OS (see below)             - IOP 19 today, 03.16.22  - pt is s/p Ahmed Valve OS on 1.18.22 ([place])  - continue drops per Dr. Ander Slade -- appreciate his expert care, now released from Dr. Duayne Cal care  - currently just on brimonidine BID and ketorolac BID OS  - f/u 9 mos, DFE, OCT  5,6. CME OS  - mild CME that was improved on 7.21.21 -- stopped Prolensa and started Lotemax taper  - interval re-development of CME (08.18.21)  - s/p STK OS (08.18.21)  - today, CME stably resolved on exam and OCT  - BCVA 20/50  - on ketorolac BID OS until bottle runs out per Dr. Ander Slade   - f/u 9 months, DFE, OCT  7. Pseudophakia OU  - s/p CE/IOL OU -- pt can't remember dates, one eye by [facility], other eye by [facility]  - h/o UGH OS and now sutured IOL as above  - PCIOL OD appears to be in good position  - monitor  8. Lyme disease  - history of  diagnosis from target lesions on lower extremities  - no blood test confirmation performed  - completed doxycycline per PCP office  - no manifestations in the eye, but will continue to monitor  9,10. Brow ptosis, dermatochalasis OU  - referred to Dr. Vickki Muff (now at [facility]) for eyelid eval  - holding on repair for  now     Ophthalmic Meds Ordered this visit:  No orders of the defined types were placed in this encounter.      Return in about 9 months (around 12/12/2021) for f/u 9 months UGH syndrome, DFE, OCT.  There are no Patient Instructions on file for this visit.  This document serves as a record of services personally performed by Gardiner Sleeper, MD, PhD. It was created on their behalf by Roselee Nova, COMT. The creation of this record is the provider's dictation and/or activities during the visit.  Electronically signed by: Roselee Nova, COMT 03/12/21 4:56 PM   This document serves as a record of services personally performed by Gardiner Sleeper, MD, PhD. It was created on their behalf by San Jetty. Owens Shark, OA an ophthalmic technician. The creation of this record is the provider's dictation and/or activities during the visit.    Electronically signed by: San Jetty. Owens Shark, [place] 03.16.2022 4:56 PM  Gardiner Sleeper, M.D., Ph.D. Diseases & Surgery of the Retina and Vitreous [facility]  I have reviewed the above documentation for accuracy and completeness, and I agree with the above. Gardiner Sleeper, M.D., Ph.D. 03/12/21 4:56 PM  Abbreviations: M myopia (nearsighted); A astigmatism; H hyperopia (farsighted); P presbyopia; Mrx spectacle prescription;  CTL contact lenses; OD right eye; OS left eye; OU both eyes  XT exotropia; ET esotropia; PEK punctate epithelial keratitis; PEE punctate epithelial erosions; DES dry eye syndrome; MGD meibomian gland dysfunction; ATs artificial tears; PFAT's preservative free artificial tears; [place] nuclear sclerotic cataract; PSC  posterior subcapsular cataract; ERM epi-retinal membrane; PVD posterior vitreous detachment; RD retinal detachment; DM diabetes mellitus; DR diabetic retinopathy; NPDR non-proliferative diabetic retinopathy; PDR proliferative diabetic retinopathy; CSME clinically significant macular edema; DME diabetic macular edema; dbh dot blot hemorrhages; CWS cotton wool spot; POAG primary open angle glaucoma; C/D cup-to-disc ratio; HVF humphrey visual field; GVF goldmann visual field; OCT optical coherence tomography; IOP intraocular pressure; BRVO Branch retinal vein occlusion; CRVO central retinal vein occlusion; CRAO central retinal artery occlusion; BRAO branch retinal artery occlusion; RT retinal tear; SB scleral buckle; PPV pars plana vitrectomy; VH Vitreous hemorrhage; PRP panretinal laser photocoagulation; IVK intravitreal kenalog; VMT vitreomacular traction; MH Macular hole;  NVD neovascularization of the disc; NVE neovascularization elsewhere; AREDS age related eye disease study; ARMD age related macular degeneration; POAG primary open angle glaucoma; EBMD epithelial/anterior basement membrane dystrophy; ACIOL anterior chamber intraocular lens; IOL intraocular lens; PCIOL posterior chamber intraocular lens; Phaco/IOL phacoemulsification with intraocular lens placement; [place] photorefractive keratectomy; LASIK laser assisted in situ keratomileusis; HTN hypertension; DM diabetes mellitus; COPD chronic obstructive pulmonary disease

## 2021-03-12 ENCOUNTER — Ambulatory Visit (INDEPENDENT_AMBULATORY_CARE_PROVIDER_SITE_OTHER): Payer: Medicare Other | Admitting: Ophthalmology

## 2021-03-12 ENCOUNTER — Encounter (INDEPENDENT_AMBULATORY_CARE_PROVIDER_SITE_OTHER): Payer: Self-pay | Admitting: Ophthalmology

## 2021-03-12 ENCOUNTER — Other Ambulatory Visit: Payer: Self-pay

## 2021-03-12 DIAGNOSIS — A6920 Lyme disease, unspecified: Secondary | ICD-10-CM | POA: Diagnosis not present

## 2021-03-12 DIAGNOSIS — H4042X0 Glaucoma secondary to eye inflammation, left eye, stage unspecified: Secondary | ICD-10-CM

## 2021-03-12 DIAGNOSIS — Z961 Presence of intraocular lens: Secondary | ICD-10-CM | POA: Diagnosis not present

## 2021-03-12 DIAGNOSIS — T85398A Other mechanical complication of other ocular prosthetic devices, implants and grafts, initial encounter: Secondary | ICD-10-CM | POA: Diagnosis not present

## 2021-03-12 DIAGNOSIS — H401321 Pigmentary glaucoma, left eye, mild stage: Secondary | ICD-10-CM

## 2021-03-12 DIAGNOSIS — H209 Unspecified iridocyclitis: Secondary | ICD-10-CM | POA: Diagnosis not present

## 2021-03-12 DIAGNOSIS — H3581 Retinal edema: Secondary | ICD-10-CM

## 2021-03-12 DIAGNOSIS — H27122 Anterior dislocation of lens, left eye: Secondary | ICD-10-CM | POA: Diagnosis not present

## 2021-03-12 DIAGNOSIS — H57819 Brow ptosis, unspecified: Secondary | ICD-10-CM | POA: Diagnosis not present

## 2021-03-12 DIAGNOSIS — H02834 Dermatochalasis of left upper eyelid: Secondary | ICD-10-CM | POA: Diagnosis not present

## 2021-03-12 DIAGNOSIS — H35352 Cystoid macular degeneration, left eye: Secondary | ICD-10-CM | POA: Diagnosis not present

## 2021-03-12 DIAGNOSIS — H02831 Dermatochalasis of right upper eyelid: Secondary | ICD-10-CM | POA: Diagnosis not present

## 2021-03-17 DIAGNOSIS — M79642 Pain in left hand: Secondary | ICD-10-CM | POA: Diagnosis not present

## 2021-03-17 DIAGNOSIS — S52132A Displaced fracture of neck of left radius, initial encounter for closed fracture: Secondary | ICD-10-CM | POA: Diagnosis not present

## 2021-04-07 DIAGNOSIS — Z6829 Body mass index (BMI) 29.0-29.9, adult: Secondary | ICD-10-CM | POA: Diagnosis not present

## 2021-04-07 DIAGNOSIS — M109 Gout, unspecified: Secondary | ICD-10-CM | POA: Diagnosis not present

## 2021-04-07 DIAGNOSIS — L03114 Cellulitis of left upper limb: Secondary | ICD-10-CM | POA: Diagnosis not present

## 2021-04-08 DIAGNOSIS — H02831 Dermatochalasis of right upper eyelid: Secondary | ICD-10-CM | POA: Diagnosis not present

## 2021-04-08 DIAGNOSIS — T8579XS Infection and inflammatory reaction due to other internal prosthetic devices, implants and grafts, sequela: Secondary | ICD-10-CM | POA: Diagnosis not present

## 2021-04-08 DIAGNOSIS — Z961 Presence of intraocular lens: Secondary | ICD-10-CM | POA: Diagnosis not present

## 2021-04-08 DIAGNOSIS — H02834 Dermatochalasis of left upper eyelid: Secondary | ICD-10-CM | POA: Diagnosis not present

## 2021-04-08 DIAGNOSIS — H57813 Brow ptosis, bilateral: Secondary | ICD-10-CM | POA: Diagnosis not present

## 2021-04-08 DIAGNOSIS — H35352 Cystoid macular degeneration, left eye: Secondary | ICD-10-CM | POA: Diagnosis not present

## 2021-04-14 DIAGNOSIS — M7022 Olecranon bursitis, left elbow: Secondary | ICD-10-CM | POA: Diagnosis not present

## 2021-04-14 DIAGNOSIS — M25522 Pain in left elbow: Secondary | ICD-10-CM | POA: Diagnosis not present

## 2021-04-14 DIAGNOSIS — S52125D Nondisplaced fracture of head of left radius, subsequent encounter for closed fracture with routine healing: Secondary | ICD-10-CM | POA: Diagnosis not present

## 2021-04-14 DIAGNOSIS — S52125A Nondisplaced fracture of head of left radius, initial encounter for closed fracture: Secondary | ICD-10-CM | POA: Diagnosis not present

## 2021-04-18 ENCOUNTER — Other Ambulatory Visit: Payer: Self-pay | Admitting: Cardiology

## 2021-04-25 DIAGNOSIS — M7022 Olecranon bursitis, left elbow: Secondary | ICD-10-CM | POA: Diagnosis not present

## 2021-04-25 DIAGNOSIS — S52125A Nondisplaced fracture of head of left radius, initial encounter for closed fracture: Secondary | ICD-10-CM | POA: Diagnosis not present

## 2021-04-25 DIAGNOSIS — Z6829 Body mass index (BMI) 29.0-29.9, adult: Secondary | ICD-10-CM | POA: Diagnosis not present

## 2021-04-29 DIAGNOSIS — Z6829 Body mass index (BMI) 29.0-29.9, adult: Secondary | ICD-10-CM | POA: Diagnosis not present

## 2021-04-29 DIAGNOSIS — M7022 Olecranon bursitis, left elbow: Secondary | ICD-10-CM | POA: Diagnosis not present

## 2021-04-29 DIAGNOSIS — S52125A Nondisplaced fracture of head of left radius, initial encounter for closed fracture: Secondary | ICD-10-CM | POA: Diagnosis not present

## 2021-04-30 DIAGNOSIS — H02834 Dermatochalasis of left upper eyelid: Secondary | ICD-10-CM | POA: Diagnosis not present

## 2021-04-30 DIAGNOSIS — E119 Type 2 diabetes mellitus without complications: Secondary | ICD-10-CM | POA: Diagnosis not present

## 2021-04-30 DIAGNOSIS — H52229 Regular astigmatism, unspecified eye: Secondary | ICD-10-CM | POA: Diagnosis not present

## 2021-04-30 DIAGNOSIS — Z961 Presence of intraocular lens: Secondary | ICD-10-CM | POA: Diagnosis not present

## 2021-05-01 ENCOUNTER — Other Ambulatory Visit (HOSPITAL_COMMUNITY): Payer: Self-pay | Admitting: Family Medicine

## 2021-05-01 ENCOUNTER — Other Ambulatory Visit: Payer: Self-pay | Admitting: Family Medicine

## 2021-05-01 DIAGNOSIS — I10 Essential (primary) hypertension: Secondary | ICD-10-CM | POA: Diagnosis not present

## 2021-05-01 DIAGNOSIS — E1169 Type 2 diabetes mellitus with other specified complication: Secondary | ICD-10-CM | POA: Diagnosis not present

## 2021-05-01 DIAGNOSIS — R5383 Other fatigue: Secondary | ICD-10-CM | POA: Diagnosis not present

## 2021-05-01 DIAGNOSIS — Z125 Encounter for screening for malignant neoplasm of prostate: Secondary | ICD-10-CM | POA: Diagnosis not present

## 2021-05-01 DIAGNOSIS — M7022 Olecranon bursitis, left elbow: Secondary | ICD-10-CM

## 2021-05-06 DIAGNOSIS — E7849 Other hyperlipidemia: Secondary | ICD-10-CM | POA: Diagnosis not present

## 2021-05-06 DIAGNOSIS — M7022 Olecranon bursitis, left elbow: Secondary | ICD-10-CM | POA: Diagnosis not present

## 2021-05-06 DIAGNOSIS — R0789 Other chest pain: Secondary | ICD-10-CM | POA: Diagnosis not present

## 2021-05-06 DIAGNOSIS — I10 Essential (primary) hypertension: Secondary | ICD-10-CM | POA: Diagnosis not present

## 2021-05-06 DIAGNOSIS — E1151 Type 2 diabetes mellitus with diabetic peripheral angiopathy without gangrene: Secondary | ICD-10-CM | POA: Diagnosis not present

## 2021-05-06 DIAGNOSIS — I779 Disorder of arteries and arterioles, unspecified: Secondary | ICD-10-CM | POA: Diagnosis not present

## 2021-05-06 DIAGNOSIS — I25119 Atherosclerotic heart disease of native coronary artery with unspecified angina pectoris: Secondary | ICD-10-CM | POA: Diagnosis not present

## 2021-05-06 DIAGNOSIS — J439 Emphysema, unspecified: Secondary | ICD-10-CM | POA: Diagnosis not present

## 2021-05-08 ENCOUNTER — Ambulatory Visit (HOSPITAL_COMMUNITY)
Admission: RE | Admit: 2021-05-08 | Discharge: 2021-05-08 | Disposition: A | Discharge status: Home / Self Care | Payer: Medicare Other | Source: Ambulatory Visit | Attending: Family Medicine | Admitting: Family Medicine

## 2021-05-08 DIAGNOSIS — S53432A Radial collateral ligament sprain of left elbow, initial encounter: Secondary | ICD-10-CM | POA: Diagnosis not present

## 2021-05-08 DIAGNOSIS — M7022 Olecranon bursitis, left elbow: Secondary | ICD-10-CM | POA: Diagnosis not present

## 2021-05-08 DIAGNOSIS — S46312A Strain of muscle, fascia and tendon of triceps, left arm, initial encounter: Secondary | ICD-10-CM | POA: Diagnosis not present

## 2021-05-08 DIAGNOSIS — S56512A Strain of other extensor muscle, fascia and tendon at forearm level, left arm, initial encounter: Secondary | ICD-10-CM | POA: Diagnosis not present

## 2021-05-08 DIAGNOSIS — M7712 Lateral epicondylitis, left elbow: Secondary | ICD-10-CM | POA: Diagnosis not present

## 2021-05-09 DIAGNOSIS — R079 Chest pain, unspecified: Secondary | ICD-10-CM | POA: Diagnosis not present

## 2021-05-09 DIAGNOSIS — I252 Old myocardial infarction: Secondary | ICD-10-CM | POA: Diagnosis not present

## 2021-05-28 DIAGNOSIS — M25522 Pain in left elbow: Secondary | ICD-10-CM | POA: Diagnosis not present

## 2021-06-04 DIAGNOSIS — M25522 Pain in left elbow: Secondary | ICD-10-CM | POA: Diagnosis not present

## 2021-06-04 DIAGNOSIS — Z9181 History of falling: Secondary | ICD-10-CM | POA: Diagnosis not present

## 2021-06-04 DIAGNOSIS — M25622 Stiffness of left elbow, not elsewhere classified: Secondary | ICD-10-CM | POA: Diagnosis not present

## 2021-06-04 DIAGNOSIS — Z8781 Personal history of (healed) traumatic fracture: Secondary | ICD-10-CM | POA: Diagnosis not present

## 2021-06-08 ENCOUNTER — Other Ambulatory Visit: Payer: Self-pay | Admitting: Cardiology

## 2021-06-09 DIAGNOSIS — M25622 Stiffness of left elbow, not elsewhere classified: Secondary | ICD-10-CM | POA: Diagnosis not present

## 2021-06-09 DIAGNOSIS — M25522 Pain in left elbow: Secondary | ICD-10-CM | POA: Diagnosis not present

## 2021-06-09 DIAGNOSIS — Z9181 History of falling: Secondary | ICD-10-CM | POA: Diagnosis not present

## 2021-06-09 DIAGNOSIS — Z8781 Personal history of (healed) traumatic fracture: Secondary | ICD-10-CM | POA: Diagnosis not present

## 2021-06-11 DIAGNOSIS — Z8781 Personal history of (healed) traumatic fracture: Secondary | ICD-10-CM | POA: Diagnosis not present

## 2021-06-11 DIAGNOSIS — Z9181 History of falling: Secondary | ICD-10-CM | POA: Diagnosis not present

## 2021-06-11 DIAGNOSIS — M25522 Pain in left elbow: Secondary | ICD-10-CM | POA: Diagnosis not present

## 2021-06-11 DIAGNOSIS — M25622 Stiffness of left elbow, not elsewhere classified: Secondary | ICD-10-CM | POA: Diagnosis not present

## 2021-06-16 DIAGNOSIS — M25622 Stiffness of left elbow, not elsewhere classified: Secondary | ICD-10-CM | POA: Diagnosis not present

## 2021-06-16 DIAGNOSIS — Z8781 Personal history of (healed) traumatic fracture: Secondary | ICD-10-CM | POA: Diagnosis not present

## 2021-06-16 DIAGNOSIS — Z9181 History of falling: Secondary | ICD-10-CM | POA: Diagnosis not present

## 2021-06-16 DIAGNOSIS — M25522 Pain in left elbow: Secondary | ICD-10-CM | POA: Diagnosis not present

## 2021-06-17 DIAGNOSIS — R338 Other retention of urine: Secondary | ICD-10-CM | POA: Diagnosis not present

## 2021-06-17 DIAGNOSIS — R3912 Poor urinary stream: Secondary | ICD-10-CM | POA: Diagnosis not present

## 2021-06-18 DIAGNOSIS — Z8781 Personal history of (healed) traumatic fracture: Secondary | ICD-10-CM | POA: Diagnosis not present

## 2021-06-18 DIAGNOSIS — M25522 Pain in left elbow: Secondary | ICD-10-CM | POA: Diagnosis not present

## 2021-06-18 DIAGNOSIS — Z9181 History of falling: Secondary | ICD-10-CM | POA: Diagnosis not present

## 2021-06-18 DIAGNOSIS — M25622 Stiffness of left elbow, not elsewhere classified: Secondary | ICD-10-CM | POA: Diagnosis not present

## 2021-06-19 DIAGNOSIS — S46392D Other injury of muscle, fascia and tendon of triceps, left arm, subsequent encounter: Secondary | ICD-10-CM | POA: Diagnosis not present

## 2021-07-07 DIAGNOSIS — S46302A Unspecified injury of muscle, fascia and tendon of triceps, left arm, initial encounter: Secondary | ICD-10-CM | POA: Diagnosis not present

## 2021-07-07 DIAGNOSIS — S46392A Other injury of muscle, fascia and tendon of triceps, left arm, initial encounter: Secondary | ICD-10-CM | POA: Diagnosis not present

## 2021-07-07 DIAGNOSIS — Y999 Unspecified external cause status: Secondary | ICD-10-CM | POA: Diagnosis not present

## 2021-07-07 DIAGNOSIS — G8918 Other acute postprocedural pain: Secondary | ICD-10-CM | POA: Diagnosis not present

## 2021-07-07 DIAGNOSIS — X58XXXA Exposure to other specified factors, initial encounter: Secondary | ICD-10-CM | POA: Diagnosis not present

## 2021-07-07 DIAGNOSIS — M7022 Olecranon bursitis, left elbow: Secondary | ICD-10-CM | POA: Diagnosis not present

## 2021-07-14 DIAGNOSIS — M79602 Pain in left arm: Secondary | ICD-10-CM | POA: Diagnosis not present

## 2021-07-21 DIAGNOSIS — M25522 Pain in left elbow: Secondary | ICD-10-CM | POA: Diagnosis not present

## 2021-07-21 DIAGNOSIS — Z4789 Encounter for other orthopedic aftercare: Secondary | ICD-10-CM | POA: Diagnosis not present

## 2021-07-21 DIAGNOSIS — S46392D Other injury of muscle, fascia and tendon of triceps, left arm, subsequent encounter: Secondary | ICD-10-CM | POA: Diagnosis not present

## 2021-07-23 DIAGNOSIS — Z978 Presence of other specified devices: Secondary | ICD-10-CM | POA: Diagnosis not present

## 2021-07-23 DIAGNOSIS — R600 Localized edema: Secondary | ICD-10-CM | POA: Diagnosis not present

## 2021-07-23 DIAGNOSIS — S46392D Other injury of muscle, fascia and tendon of triceps, left arm, subsequent encounter: Secondary | ICD-10-CM | POA: Diagnosis not present

## 2021-07-23 DIAGNOSIS — R29898 Other symptoms and signs involving the musculoskeletal system: Secondary | ICD-10-CM | POA: Diagnosis not present

## 2021-07-23 DIAGNOSIS — M79642 Pain in left hand: Secondary | ICD-10-CM | POA: Diagnosis not present

## 2021-07-29 DIAGNOSIS — S46392D Other injury of muscle, fascia and tendon of triceps, left arm, subsequent encounter: Secondary | ICD-10-CM | POA: Diagnosis not present

## 2021-07-29 DIAGNOSIS — M79642 Pain in left hand: Secondary | ICD-10-CM | POA: Diagnosis not present

## 2021-07-29 DIAGNOSIS — R600 Localized edema: Secondary | ICD-10-CM | POA: Diagnosis not present

## 2021-07-29 DIAGNOSIS — R29898 Other symptoms and signs involving the musculoskeletal system: Secondary | ICD-10-CM | POA: Diagnosis not present

## 2021-07-29 DIAGNOSIS — Z978 Presence of other specified devices: Secondary | ICD-10-CM | POA: Diagnosis not present

## 2021-07-31 DIAGNOSIS — E039 Hypothyroidism, unspecified: Secondary | ICD-10-CM | POA: Diagnosis not present

## 2021-07-31 DIAGNOSIS — S46392D Other injury of muscle, fascia and tendon of triceps, left arm, subsequent encounter: Secondary | ICD-10-CM | POA: Diagnosis not present

## 2021-07-31 DIAGNOSIS — E1169 Type 2 diabetes mellitus with other specified complication: Secondary | ICD-10-CM | POA: Diagnosis not present

## 2021-07-31 DIAGNOSIS — R5383 Other fatigue: Secondary | ICD-10-CM | POA: Diagnosis not present

## 2021-07-31 DIAGNOSIS — M79642 Pain in left hand: Secondary | ICD-10-CM | POA: Diagnosis not present

## 2021-07-31 DIAGNOSIS — R29898 Other symptoms and signs involving the musculoskeletal system: Secondary | ICD-10-CM | POA: Diagnosis not present

## 2021-07-31 DIAGNOSIS — I10 Essential (primary) hypertension: Secondary | ICD-10-CM | POA: Diagnosis not present

## 2021-07-31 DIAGNOSIS — R600 Localized edema: Secondary | ICD-10-CM | POA: Diagnosis not present

## 2021-07-31 DIAGNOSIS — E782 Mixed hyperlipidemia: Secondary | ICD-10-CM | POA: Diagnosis not present

## 2021-07-31 DIAGNOSIS — Z978 Presence of other specified devices: Secondary | ICD-10-CM | POA: Diagnosis not present

## 2021-07-31 DIAGNOSIS — E7849 Other hyperlipidemia: Secondary | ICD-10-CM | POA: Diagnosis not present

## 2021-08-04 DIAGNOSIS — I779 Disorder of arteries and arterioles, unspecified: Secondary | ICD-10-CM | POA: Diagnosis not present

## 2021-08-04 DIAGNOSIS — J439 Emphysema, unspecified: Secondary | ICD-10-CM | POA: Diagnosis not present

## 2021-08-04 DIAGNOSIS — I25119 Atherosclerotic heart disease of native coronary artery with unspecified angina pectoris: Secondary | ICD-10-CM | POA: Diagnosis not present

## 2021-08-04 DIAGNOSIS — E7849 Other hyperlipidemia: Secondary | ICD-10-CM | POA: Diagnosis not present

## 2021-08-04 DIAGNOSIS — I10 Essential (primary) hypertension: Secondary | ICD-10-CM | POA: Diagnosis not present

## 2021-08-04 DIAGNOSIS — S46312A Strain of muscle, fascia and tendon of triceps, left arm, initial encounter: Secondary | ICD-10-CM | POA: Diagnosis not present

## 2021-08-04 DIAGNOSIS — E039 Hypothyroidism, unspecified: Secondary | ICD-10-CM | POA: Diagnosis not present

## 2021-08-04 DIAGNOSIS — E1151 Type 2 diabetes mellitus with diabetic peripheral angiopathy without gangrene: Secondary | ICD-10-CM | POA: Diagnosis not present

## 2021-08-05 DIAGNOSIS — M25522 Pain in left elbow: Secondary | ICD-10-CM | POA: Diagnosis not present

## 2021-08-05 DIAGNOSIS — S46392D Other injury of muscle, fascia and tendon of triceps, left arm, subsequent encounter: Secondary | ICD-10-CM | POA: Diagnosis not present

## 2021-08-12 DIAGNOSIS — M25522 Pain in left elbow: Secondary | ICD-10-CM | POA: Diagnosis not present

## 2021-08-12 DIAGNOSIS — S46392D Other injury of muscle, fascia and tendon of triceps, left arm, subsequent encounter: Secondary | ICD-10-CM | POA: Diagnosis not present

## 2021-08-14 DIAGNOSIS — M25522 Pain in left elbow: Secondary | ICD-10-CM | POA: Diagnosis not present

## 2021-08-14 DIAGNOSIS — S46392D Other injury of muscle, fascia and tendon of triceps, left arm, subsequent encounter: Secondary | ICD-10-CM | POA: Diagnosis not present

## 2021-08-18 IMAGING — MR MR ELBOW*L* W/O CM
5 series · 40 of 40 positions shown · non-contrast
Comparison: None.

CLINICAL DATA: Left elbow pain for 1 month.  No known injury.

EXAM:
MRI OF THE LEFT ELBOW WITHOUT CONTRAST
TECHNIQUE: Multiplanar, multisequence MR imaging of the elbow was performed. No
intravenous contrast was administered.

[Series 3: T1 · axial · left · 3.0mm · 0.47mm/px · z∈[-55,+39]mm · 7 of 25 slices shown (1 of 2)]
[im 1/25]
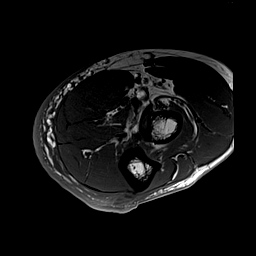
[im 5/25]
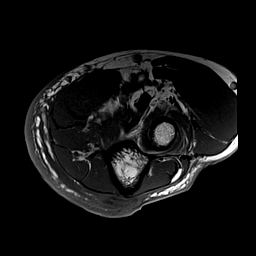
[im 9/25]
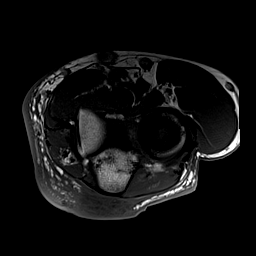
[im 13/25]
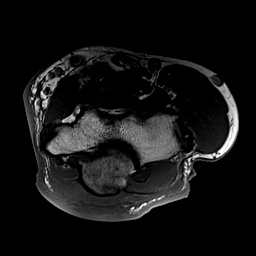
[im 17/25]
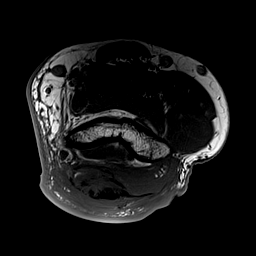
[im 21/25]
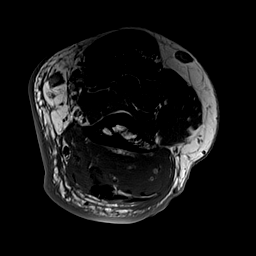
[im 25/25]
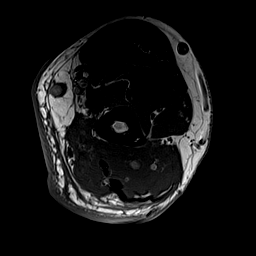

[Series 4: T2 fat-sat · axial · left · 3.0mm · 0.47mm/px · z∈[-55,+39]mm · 8 of 25 slices shown (1 of 2)]
[im 1/25]
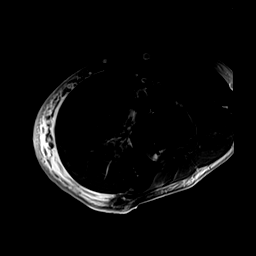
[im 4/25]
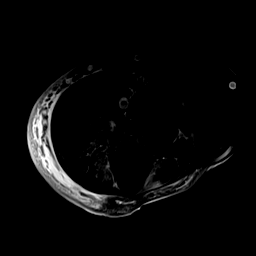
[im 7/25]
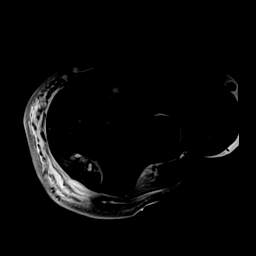
[im 11/25]
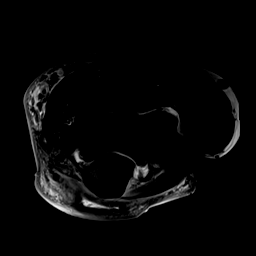
[im 14/25]
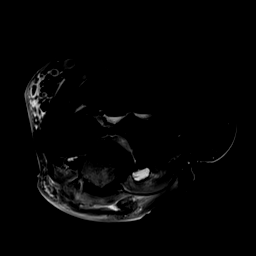
[im 18/25]
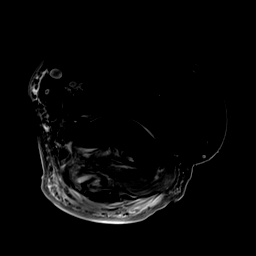
[im 21/25]
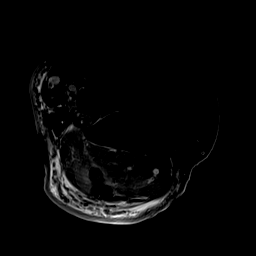
[im 25/25]
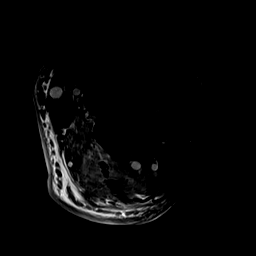

[Series 5: T2 fat-sat · coronal · left · 3.0mm · 0.55mm/px · 8 of 25 slices shown (2 of 2)]
[im 1/25]
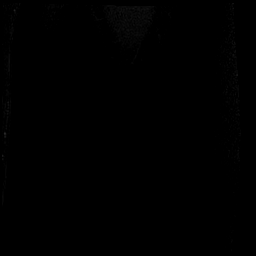
[im 4/25]
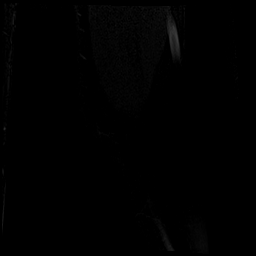
[im 7/25]
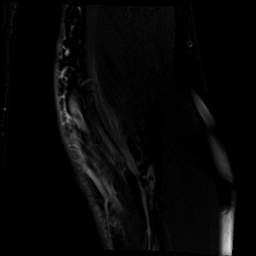
[im 11/25]
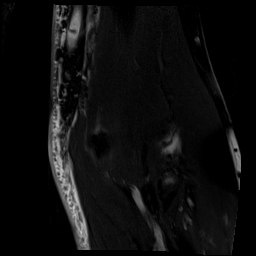
[im 14/25]
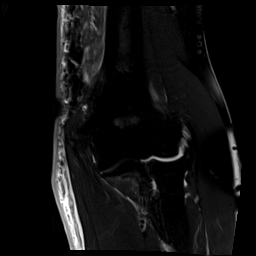
[im 18/25]
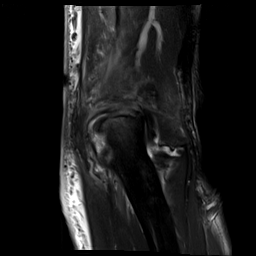
[im 21/25]
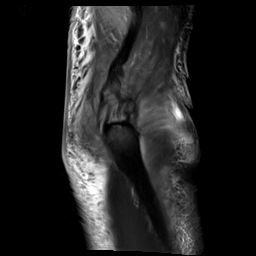
[im 25/25]
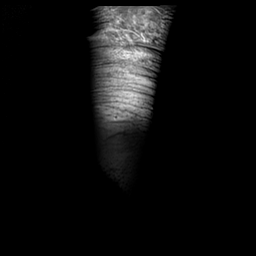

[Series 6: T1 · coronal · left · 3.0mm · 0.55mm/px · 8 of 25 slices shown (2 of 2)]
[im 1/25]
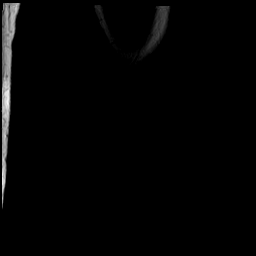
[im 4/25]
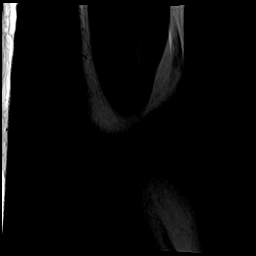
[im 7/25]
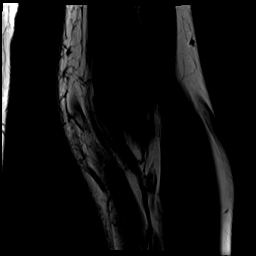
[im 11/25]
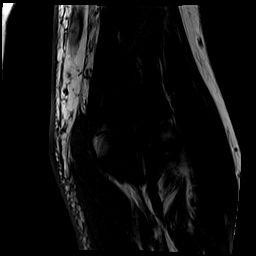
[im 14/25]
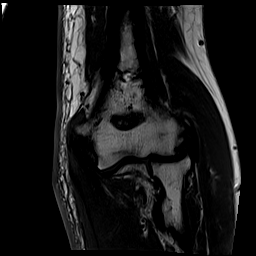
[im 18/25]
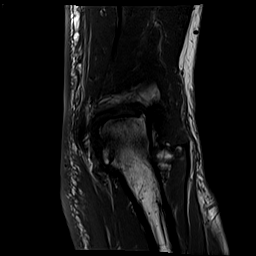
[im 21/25]
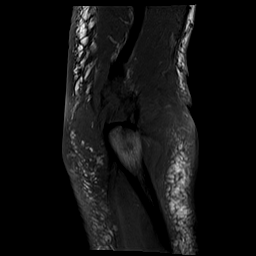
[im 25/25]
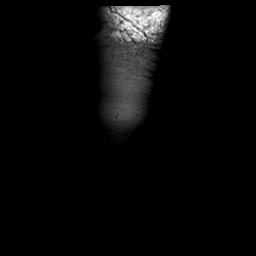

[Series 7: PD fat-sat · sagittal · left · 3.0mm · 0.55mm/px · 9 of 27 slices shown]
[im 1/27]
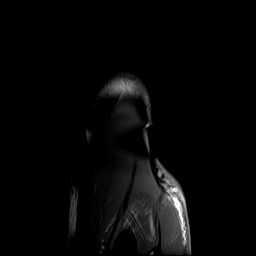
[im 4/27]
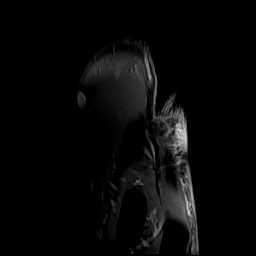
[im 7/27]
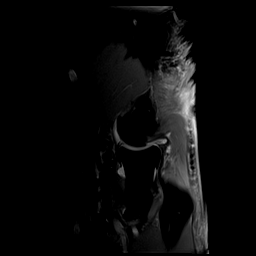
[im 10/27]
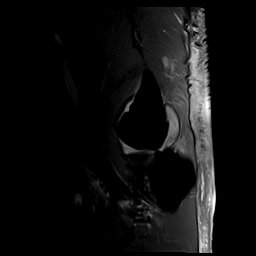
[im 14/27]
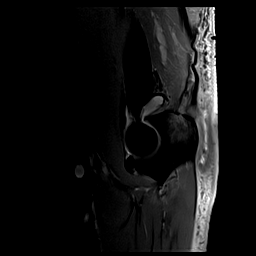
[im 17/27]
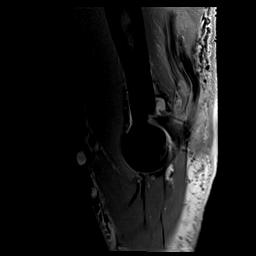
[im 20/27]
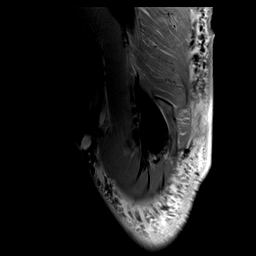
[im 23/27]
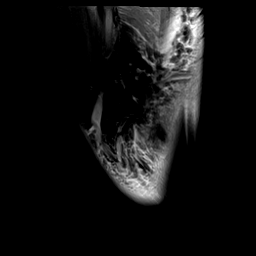
[im 27/27]
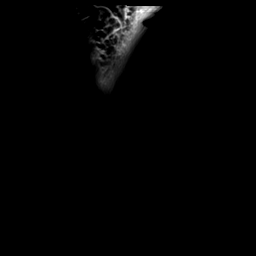

[40 of 40 positions shown; findings below may reference images not displayed]

FINDINGS: TENDONS

Common forearm flexor origin: Intact and normal in appearance.

Common forearm extensor origin: There is intrasubstance increased T2
signal in the common extensor origin and a partial tear measuring
approximately 0.5 cm AP with tendon retraction of up to 0.6 cm.

Biceps: Intact.

Triceps: There is severe tendinosis and a near complete tear of the
tendon from the olecranon. Tendon retraction is approximately 1 cm.
Marrow edema in the olecranon consistent with reactive change is
noted.

LIGAMENTS

Medial stabilizers: Intact.

Lateral stabilizers: The radial collateral ligament is torn from the
radius. There is intrasubstance increased T2 signal and thickening
in the proximal fibers of the lateral ulnar collateral ligament
consistent with sprain.

Cartilage: Mildly degenerated.

Joint: Small to moderate joint effusion.

Cubital tunnel: Normal.

Bones: No fracture or worrisome lesion. There is some osteophytosis
about the elbow.
IMPRESSION: Severe triceps tendinosis with a near complete tear of the tendon
and 1 cm of retraction. Reactive marrow edema in the olecranon is
noted.

Lateral epicondylitis with a partial tear of the common extensor
origin and an associated tear of the radial collateral ligament from
the humerus and sprain of the proximal fibers of the lateral ulnar
collateral ligament.

## 2021-08-19 DIAGNOSIS — S46392D Other injury of muscle, fascia and tendon of triceps, left arm, subsequent encounter: Secondary | ICD-10-CM | POA: Diagnosis not present

## 2021-08-19 DIAGNOSIS — M25522 Pain in left elbow: Secondary | ICD-10-CM | POA: Diagnosis not present

## 2021-08-20 ENCOUNTER — Ambulatory Visit: Payer: Medicare Other | Admitting: Cardiology

## 2021-08-20 DIAGNOSIS — M25522 Pain in left elbow: Secondary | ICD-10-CM | POA: Diagnosis not present

## 2021-08-20 DIAGNOSIS — S46392D Other injury of muscle, fascia and tendon of triceps, left arm, subsequent encounter: Secondary | ICD-10-CM | POA: Diagnosis not present

## 2021-08-25 DIAGNOSIS — Z6828 Body mass index (BMI) 28.0-28.9, adult: Secondary | ICD-10-CM | POA: Diagnosis not present

## 2021-08-25 DIAGNOSIS — M10072 Idiopathic gout, left ankle and foot: Secondary | ICD-10-CM | POA: Diagnosis not present

## 2021-08-26 DIAGNOSIS — M25522 Pain in left elbow: Secondary | ICD-10-CM | POA: Diagnosis not present

## 2021-08-26 DIAGNOSIS — S46392D Other injury of muscle, fascia and tendon of triceps, left arm, subsequent encounter: Secondary | ICD-10-CM | POA: Diagnosis not present

## 2021-08-28 DIAGNOSIS — M25522 Pain in left elbow: Secondary | ICD-10-CM | POA: Diagnosis not present

## 2021-08-28 DIAGNOSIS — S46392D Other injury of muscle, fascia and tendon of triceps, left arm, subsequent encounter: Secondary | ICD-10-CM | POA: Diagnosis not present

## 2021-09-02 DIAGNOSIS — H02831 Dermatochalasis of right upper eyelid: Secondary | ICD-10-CM | POA: Diagnosis not present

## 2021-09-02 DIAGNOSIS — H02834 Dermatochalasis of left upper eyelid: Secondary | ICD-10-CM | POA: Diagnosis not present

## 2021-09-02 DIAGNOSIS — Z961 Presence of intraocular lens: Secondary | ICD-10-CM | POA: Diagnosis not present

## 2021-09-02 DIAGNOSIS — T8579XS Infection and inflammatory reaction due to other internal prosthetic devices, implants and grafts, sequela: Secondary | ICD-10-CM | POA: Diagnosis not present

## 2021-09-02 DIAGNOSIS — M2560 Stiffness of unspecified joint, not elsewhere classified: Secondary | ICD-10-CM | POA: Diagnosis not present

## 2021-09-02 DIAGNOSIS — H57813 Brow ptosis, bilateral: Secondary | ICD-10-CM | POA: Diagnosis not present

## 2021-09-02 DIAGNOSIS — R600 Localized edema: Secondary | ICD-10-CM | POA: Diagnosis not present

## 2021-09-02 DIAGNOSIS — Z789 Other specified health status: Secondary | ICD-10-CM | POA: Diagnosis not present

## 2021-09-02 DIAGNOSIS — E119 Type 2 diabetes mellitus without complications: Secondary | ICD-10-CM | POA: Diagnosis not present

## 2021-09-02 DIAGNOSIS — H35352 Cystoid macular degeneration, left eye: Secondary | ICD-10-CM | POA: Diagnosis not present

## 2021-09-02 DIAGNOSIS — S46392D Other injury of muscle, fascia and tendon of triceps, left arm, subsequent encounter: Secondary | ICD-10-CM | POA: Diagnosis not present

## 2021-09-04 DIAGNOSIS — S46392D Other injury of muscle, fascia and tendon of triceps, left arm, subsequent encounter: Secondary | ICD-10-CM | POA: Diagnosis not present

## 2021-09-04 DIAGNOSIS — M2560 Stiffness of unspecified joint, not elsewhere classified: Secondary | ICD-10-CM | POA: Diagnosis not present

## 2021-09-04 DIAGNOSIS — Z789 Other specified health status: Secondary | ICD-10-CM | POA: Diagnosis not present

## 2021-09-04 DIAGNOSIS — R600 Localized edema: Secondary | ICD-10-CM | POA: Diagnosis not present

## 2021-09-05 DIAGNOSIS — S46392D Other injury of muscle, fascia and tendon of triceps, left arm, subsequent encounter: Secondary | ICD-10-CM | POA: Diagnosis not present

## 2021-09-08 ENCOUNTER — Other Ambulatory Visit: Payer: Self-pay | Admitting: Cardiology

## 2021-09-16 DIAGNOSIS — S46392D Other injury of muscle, fascia and tendon of triceps, left arm, subsequent encounter: Secondary | ICD-10-CM | POA: Diagnosis not present

## 2021-09-16 DIAGNOSIS — Z789 Other specified health status: Secondary | ICD-10-CM | POA: Diagnosis not present

## 2021-09-16 DIAGNOSIS — R600 Localized edema: Secondary | ICD-10-CM | POA: Diagnosis not present

## 2021-09-16 DIAGNOSIS — M2560 Stiffness of unspecified joint, not elsewhere classified: Secondary | ICD-10-CM | POA: Diagnosis not present

## 2021-09-17 DIAGNOSIS — T8489XA Other specified complication of internal orthopedic prosthetic devices, implants and grafts, initial encounter: Secondary | ICD-10-CM | POA: Diagnosis not present

## 2021-09-17 DIAGNOSIS — M86132 Other acute osteomyelitis, left radius and ulna: Secondary | ICD-10-CM | POA: Diagnosis not present

## 2021-09-17 DIAGNOSIS — T84615A Infection and inflammatory reaction due to internal fixation device of left ulna, initial encounter: Secondary | ICD-10-CM | POA: Diagnosis not present

## 2021-09-23 DIAGNOSIS — T8579XS Infection and inflammatory reaction due to other internal prosthetic devices, implants and grafts, sequela: Secondary | ICD-10-CM | POA: Diagnosis not present

## 2021-09-23 DIAGNOSIS — H35352 Cystoid macular degeneration, left eye: Secondary | ICD-10-CM | POA: Diagnosis not present

## 2021-09-23 DIAGNOSIS — Z961 Presence of intraocular lens: Secondary | ICD-10-CM | POA: Diagnosis not present

## 2021-09-23 DIAGNOSIS — H02834 Dermatochalasis of left upper eyelid: Secondary | ICD-10-CM | POA: Diagnosis not present

## 2021-09-23 DIAGNOSIS — H02831 Dermatochalasis of right upper eyelid: Secondary | ICD-10-CM | POA: Diagnosis not present

## 2021-09-23 DIAGNOSIS — H57813 Brow ptosis, bilateral: Secondary | ICD-10-CM | POA: Diagnosis not present

## 2021-09-23 DIAGNOSIS — E119 Type 2 diabetes mellitus without complications: Secondary | ICD-10-CM | POA: Diagnosis not present

## 2021-10-03 DIAGNOSIS — Z20828 Contact with and (suspected) exposure to other viral communicable diseases: Secondary | ICD-10-CM | POA: Diagnosis not present

## 2021-10-06 DIAGNOSIS — Z23 Encounter for immunization: Secondary | ICD-10-CM | POA: Diagnosis not present

## 2021-10-08 DIAGNOSIS — M25522 Pain in left elbow: Secondary | ICD-10-CM | POA: Diagnosis not present

## 2021-10-08 DIAGNOSIS — S46392D Other injury of muscle, fascia and tendon of triceps, left arm, subsequent encounter: Secondary | ICD-10-CM | POA: Diagnosis not present

## 2021-10-09 DIAGNOSIS — S46392D Other injury of muscle, fascia and tendon of triceps, left arm, subsequent encounter: Secondary | ICD-10-CM | POA: Diagnosis not present

## 2021-10-09 DIAGNOSIS — M25522 Pain in left elbow: Secondary | ICD-10-CM | POA: Diagnosis not present

## 2021-10-14 DIAGNOSIS — S46392D Other injury of muscle, fascia and tendon of triceps, left arm, subsequent encounter: Secondary | ICD-10-CM | POA: Diagnosis not present

## 2021-10-15 DIAGNOSIS — M25522 Pain in left elbow: Secondary | ICD-10-CM | POA: Diagnosis not present

## 2021-10-15 DIAGNOSIS — S46392D Other injury of muscle, fascia and tendon of triceps, left arm, subsequent encounter: Secondary | ICD-10-CM | POA: Diagnosis not present

## 2021-10-17 ENCOUNTER — Encounter: Payer: Self-pay | Admitting: *Deleted

## 2021-10-20 DIAGNOSIS — Z4789 Encounter for other orthopedic aftercare: Secondary | ICD-10-CM | POA: Diagnosis not present

## 2021-10-20 DIAGNOSIS — Z472 Encounter for removal of internal fixation device: Secondary | ICD-10-CM | POA: Diagnosis not present

## 2021-10-20 DIAGNOSIS — M7022 Olecranon bursitis, left elbow: Secondary | ICD-10-CM | POA: Diagnosis not present

## 2021-10-20 DIAGNOSIS — T8469XA Infection and inflammatory reaction due to internal fixation device of other site, initial encounter: Secondary | ICD-10-CM | POA: Diagnosis not present

## 2021-10-20 DIAGNOSIS — S46392D Other injury of muscle, fascia and tendon of triceps, left arm, subsequent encounter: Secondary | ICD-10-CM | POA: Diagnosis not present

## 2021-10-21 ENCOUNTER — Ambulatory Visit (INDEPENDENT_AMBULATORY_CARE_PROVIDER_SITE_OTHER): Payer: Medicare Other | Admitting: Cardiology

## 2021-10-21 ENCOUNTER — Encounter: Payer: Self-pay | Admitting: Cardiology

## 2021-10-21 VITALS — BP 116/58 | HR 52 | Ht 68.0 in | Wt 181.0 lb

## 2021-10-21 DIAGNOSIS — I6523 Occlusion and stenosis of bilateral carotid arteries: Secondary | ICD-10-CM

## 2021-10-21 DIAGNOSIS — E782 Mixed hyperlipidemia: Secondary | ICD-10-CM

## 2021-10-21 DIAGNOSIS — I25119 Atherosclerotic heart disease of native coronary artery with unspecified angina pectoris: Secondary | ICD-10-CM | POA: Diagnosis not present

## 2021-10-21 NOTE — Progress Notes (Signed)
Cardiology Office Note  Date: 10/21/2021   ID: Christopher Randolph, DOB 1946-01-13, MRN [id number]  PCP:  Curlene Labrum, MD  Cardiologist:  Rozann Lesches, MD Electrophysiologist:  None   Chief Complaint  Patient presents with   Cardiac follow-up     History of Present Illness: Christopher Randolph is a 75 y.o. male last seen in February.  He is here for a routine visit.  He does not describe any active angina at this time, reports compliance with his medications which are noted below.  He underwent a Lexiscan Myoview at [facility] back in May that was low risk and negative for ischemia.  This was arranged by Dr. Pleas Koch.  He did have a fall with injury of his left arm, has undergone surgeries and physical therapy.  Still recovering.  I reviewed his lab work as noted below.  Last LDL was 66.  Past Medical History:  Diagnosis Date   Arthritis    CAD (coronary artery disease)    Branch vessel and moderate RCA disease 2006   Cancer ([place]) 1990   Melanoma Lower right Leg   Carotid artery disease ([place])    Essential hypertension    Glaucoma    Hyperlipidemia    Hypothyroidism    Lyme disease    PAD (peripheral artery disease) ([place])    Left common iliac stent 2004   Pneumonia    Type 2 diabetes mellitus ([place])    Wears dentures    Wears glasses     Past Surgical History:  Procedure Laterality Date   CATARACT EXTRACTION Bilateral    CATARACT EXTRACTION, BILATERAL     CERVICAL DISC SURGERY     x 1   COLONOSCOPY N/A 11/06/2016   Procedure: COLONOSCOPY;  Surgeon: Daneil Dolin, MD;  [facility];  Service: Endoscopy;  Laterality: N/A;  7:30 AM   COLONOSCOPY  2012   Dr. Anthony Sar: normal. reviewed reports, which states he has a history of polyps in remote past.    EYE SURGERY Bilateral    Cat Sx   EYE SURGERY Left 01/14/2021   [place] - Dr. Jovita Kussmaul   IRIDOTOMY / IRIDECTOMY Left 01/14/2021   [place] - Dr. Jovita Kussmaul   JOINT REPLACEMENT     LUMBAR WOUND  DEBRIDEMENT N/A 01/24/2019   Procedure: LUMBAR WOUND Exploration for Evacation of Seroma vs. Hematoma;  Surgeon: Jovita Gamma, MD;  Location: [place];  Service: Neurosurgery;  Laterality: N/A;   MELANOMA EXCISION     right leg, seen at [facility] and underwent immunotherapy   NECK SURGERY      x 1 yrs ago   PARS PLANA VITRECTOMY Left 04/04/2020   Procedure: PARS PLANA VITRECTOMY WITH 25G REMOVAL/SUTURE INTRAOCULAR LENS;  Surgeon: [name], MD;  Location: [place];  Service: Ophthalmology;  Laterality: Left;   REPLACEMENT TOTAL KNEE Right    WISDOM TOOTH EXTRACTION      Current Outpatient Medications  Medication Sig Dispense Refill   amLODipine (NORVASC) 5 MG tablet Take 5 mg by mouth daily.     aspirin EC 81 MG tablet Take 81 mg by mouth daily.       atorvastatin (LIPITOR) 20 MG tablet TAKE 1 TABLET BY MOUTH DAILY 90 tablet 1   cyclobenzaprine (FLEXERIL) 10 MG tablet Take 10 mg by mouth every 8 (eight) hours as needed.     fluticasone (FLONASE) 50 MCG/ACT nasal spray Place 2 sprays into both nostrils daily.     levothyroxine (  SYNTHROID, LEVOTHROID) 150 MCG tablet Take 150 mcg by mouth See admin instructions. Alternates taking 150 mg daily for 2 days then 175 mcg the third day then repeat  1   levothyroxine (SYNTHROID, LEVOTHROID) 175 MCG tablet Take 175 mcg by mouth See admin instructions. Alternates taking 150 mg daily for 2 days then 175 mcg the third day then repeat     Loteprednol Etabonate (LOTEMAX SM) 0.38 % GEL Place 1 drop into the left eye 4 (four) times daily. 5 g 2   metFORMIN (GLUCOPHAGE) 500 MG tablet Take 500 mg by mouth daily.     methazolamide (NEPTAZANE) 50 MG tablet Take by mouth.     metoprolol tartrate (LOPRESSOR) 25 MG tablet Take 25 mg by mouth 2 (two) times daily.      MITIGARE 0.6 MG CAPS TcT BY MOUTH AT ONE TIME, THEN TAKE 1 CAPSULE ONE HOUR. TAKE AS NEEDED FOR FOR GOUT ATTACKS     mometasone (ELOCON) 0.1 % ointment Apply 1 application topically daily.     naproxen  sodium (ANAPROX) 220 MG tablet Take 220 mg by mouth daily as needed (for pain.).      nitroGLYCERIN (NITROSTAT) 0.4 MG SL tablet DISSOLVE 1 TABLET UNDER THE TONGUE EVERY 5 MINUTES AS NEEDED FOR CHEST PAIN. DO NOT EXCEED A TOTAL OF 3 DOSES IN 15 MINUTES. 25 tablet 3   tamsulosin (FLOMAX) 0.4 MG CAPS capsule Take 0.4 mg by mouth 2 (two) times daily.     telmisartan (MICARDIS) 80 MG tablet TAKE 1 TABLET BY MOUTH EVERY DAY 90 tablet 1   No current facility-administered medications for this visit.   Allergies:  Azithromycin and Shrimp [shellfish allergy]   ROS: No palpitations or syncope.  Physical Exam: VS:  BP (!) 116/58 (BP Location: Right Arm, Patient Position: Sitting, Cuff Size: Normal)   Pulse (!) 52   Ht 5\' 8"  (1.727 m)   Wt 181 lb (82.1 kg)   SpO2 94%   BMI 27.52 kg/m , BMI Body mass index is 27.52 kg/m.  Wt Readings from Last 3 Encounters:  10/21/21 181 lb (82.1 kg)  02/06/21 179 lb (81.2 kg)  08/01/20 184 lb (83.5 kg)    General: Patient appears comfortable at rest. HEENT: Conjunctiva and lids normal, wearing a mask. Neck: Supple, no elevated JVP or carotid bruits, no thyromegaly. Lungs: Clear to auscultation, nonlabored breathing at rest. Cardiac: Regular rate and rhythm, no S3 or significant systolic murmur, no pericardial rub. Extremities: No pitting edema.  ECG:  An ECG dated 02/06/2021 was personally reviewed today and demonstrated:  Sinus rhythm.  Recent Labwork:  November 2021: BUN 19, creatinine 1.09, potassium 4.4, AST 22, ALT 24, hemoglobin 14.3, hemoglobin A1c 6.4%, cholesterol 129, triglycerides 107, HDL 43, LDL 66 April 2022: Hemoglobin 14.6 platelets 145  Other Studies Reviewed Today:  Echocardiogram 12/06/2017 [facility]): Mild LVH with LVEF 55 to 16%, normal diastolic function, mild left atrial enlargement, normal right ventricular contraction, no major valvular abnormalities.  Carotid Dopplers 08/28/2020: Summary:  Right Carotid: Velocities in  the right ICA are consistent with a 60-79%                 stenosis.   Left Carotid: Velocities in the left ICA are consistent with a 40-59%  stenosis.                Upper end of scale.   Vertebrals:  Bilateral vertebral arteries demonstrate antegrade flow.  Subclavians: Normal flow hemodynamics were seen in bilateral  subclavian               arteries.   Lexiscan Myoview 05/09/2021 [facility]): 1. No reversible ischemia. Potential scar in the basilar inferior  wall..   2. Normal left ventricular wall motion.   3. Left ventricular ejection fraction 67%   4. Non invasive risk stratification*: Low   Assessment and Plan:  1.  CAD, predominantly branch vessel disease with moderate RCA stenosis that have been managed medically.  He did have a follow-up Lexiscan Myoview in May that was low risk.  Continue aspirin, Lopressor, Norvasc, Lipitor, and myocarditis.  2.  Asymptomatic carotid artery disease, follow-up Dopplers pending in November.  Continue aspirin and Lipitor.  3.  Mixed hyperlipidemia on Lipitor, last LDL 66.  Medication Adjustments/Labs and Tests Ordered: Current medicines are reviewed at length with the patient today.  Concerns regarding medicines are outlined above.   Tests Ordered: No orders of the defined types were placed in this encounter.   Medication Changes: No orders of the defined types were placed in this encounter.   Disposition:  Follow up  6 months.  Signed, Satira Sark, MD, [facility] 10/21/2021 4:24 PM    [facility] [place] [place], [place], [place] [address] Phone: [phone]; Fax: [phone]

## 2021-10-21 NOTE — Patient Instructions (Addendum)

## 2021-10-30 ENCOUNTER — Ambulatory Visit (INDEPENDENT_AMBULATORY_CARE_PROVIDER_SITE_OTHER): Payer: Medicare Other

## 2021-10-30 DIAGNOSIS — I779 Disorder of arteries and arterioles, unspecified: Secondary | ICD-10-CM

## 2021-10-30 DIAGNOSIS — I6523 Occlusion and stenosis of bilateral carotid arteries: Secondary | ICD-10-CM | POA: Diagnosis not present

## 2021-11-10 DIAGNOSIS — H35352 Cystoid macular degeneration, left eye: Secondary | ICD-10-CM | POA: Diagnosis not present

## 2021-11-10 DIAGNOSIS — H16223 Keratoconjunctivitis sicca, not specified as Sjogren's, bilateral: Secondary | ICD-10-CM | POA: Diagnosis not present

## 2021-11-10 DIAGNOSIS — T8579XS Infection and inflammatory reaction due to other internal prosthetic devices, implants and grafts, sequela: Secondary | ICD-10-CM | POA: Diagnosis not present

## 2021-11-10 DIAGNOSIS — Z961 Presence of intraocular lens: Secondary | ICD-10-CM | POA: Diagnosis not present

## 2021-11-10 DIAGNOSIS — E119 Type 2 diabetes mellitus without complications: Secondary | ICD-10-CM | POA: Diagnosis not present

## 2021-11-10 DIAGNOSIS — H02834 Dermatochalasis of left upper eyelid: Secondary | ICD-10-CM | POA: Diagnosis not present

## 2021-11-10 DIAGNOSIS — H57813 Brow ptosis, bilateral: Secondary | ICD-10-CM | POA: Diagnosis not present

## 2021-11-10 DIAGNOSIS — H16122 Filamentary keratitis, left eye: Secondary | ICD-10-CM | POA: Diagnosis not present

## 2021-11-10 DIAGNOSIS — H02831 Dermatochalasis of right upper eyelid: Secondary | ICD-10-CM | POA: Diagnosis not present

## 2021-11-13 DIAGNOSIS — Z20828 Contact with and (suspected) exposure to other viral communicable diseases: Secondary | ICD-10-CM | POA: Diagnosis not present

## 2021-11-17 DIAGNOSIS — Z961 Presence of intraocular lens: Secondary | ICD-10-CM | POA: Diagnosis not present

## 2021-11-17 DIAGNOSIS — H16122 Filamentary keratitis, left eye: Secondary | ICD-10-CM | POA: Diagnosis not present

## 2021-11-17 DIAGNOSIS — H35352 Cystoid macular degeneration, left eye: Secondary | ICD-10-CM | POA: Diagnosis not present

## 2021-11-17 DIAGNOSIS — H02834 Dermatochalasis of left upper eyelid: Secondary | ICD-10-CM | POA: Diagnosis not present

## 2021-11-17 DIAGNOSIS — H02831 Dermatochalasis of right upper eyelid: Secondary | ICD-10-CM | POA: Diagnosis not present

## 2021-11-17 DIAGNOSIS — H57813 Brow ptosis, bilateral: Secondary | ICD-10-CM | POA: Diagnosis not present

## 2021-11-17 DIAGNOSIS — T8579XS Infection and inflammatory reaction due to other internal prosthetic devices, implants and grafts, sequela: Secondary | ICD-10-CM | POA: Diagnosis not present

## 2021-11-17 DIAGNOSIS — E119 Type 2 diabetes mellitus without complications: Secondary | ICD-10-CM | POA: Diagnosis not present

## 2021-11-17 DIAGNOSIS — H16223 Keratoconjunctivitis sicca, not specified as Sjogren's, bilateral: Secondary | ICD-10-CM | POA: Diagnosis not present

## 2021-11-25 DIAGNOSIS — H02831 Dermatochalasis of right upper eyelid: Secondary | ICD-10-CM | POA: Diagnosis not present

## 2021-11-25 DIAGNOSIS — H57813 Brow ptosis, bilateral: Secondary | ICD-10-CM | POA: Diagnosis not present

## 2021-11-25 DIAGNOSIS — H16122 Filamentary keratitis, left eye: Secondary | ICD-10-CM | POA: Diagnosis not present

## 2021-11-25 DIAGNOSIS — E119 Type 2 diabetes mellitus without complications: Secondary | ICD-10-CM | POA: Diagnosis not present

## 2021-11-25 DIAGNOSIS — T8579XS Infection and inflammatory reaction due to other internal prosthetic devices, implants and grafts, sequela: Secondary | ICD-10-CM | POA: Diagnosis not present

## 2021-11-25 DIAGNOSIS — Z961 Presence of intraocular lens: Secondary | ICD-10-CM | POA: Diagnosis not present

## 2021-11-25 DIAGNOSIS — H16223 Keratoconjunctivitis sicca, not specified as Sjogren's, bilateral: Secondary | ICD-10-CM | POA: Diagnosis not present

## 2021-11-25 DIAGNOSIS — H35352 Cystoid macular degeneration, left eye: Secondary | ICD-10-CM | POA: Diagnosis not present

## 2021-11-25 DIAGNOSIS — H02834 Dermatochalasis of left upper eyelid: Secondary | ICD-10-CM | POA: Diagnosis not present

## 2021-12-02 DIAGNOSIS — L570 Actinic keratosis: Secondary | ICD-10-CM | POA: Diagnosis not present

## 2021-12-03 ENCOUNTER — Other Ambulatory Visit: Payer: Self-pay | Admitting: Cardiology

## 2021-12-08 NOTE — Progress Notes (Signed)
[facility] Note  12/12/2021     CHIEF COMPLAINT Patient presents for Retina Follow Up  HISTORY OF PRESENT ILLNESS: Christopher Randolph is a 75 y.o. male who presents to the clinic today for:   HPI     Retina Follow Up   Patient presents with  Other.  In left eye.  This started 9 months ago.  I, the attending physician,  performed the HPI with the patient and updated documentation appropriately.        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 Patient states vision  OS not so good. OD ok. OS is fuzzy. Has a greenish spot in center. Has new glasses. No eye pain. Dr [name] added drops. Latanoprost QHS OS, Predinosolone BID OS, Restasis BID OU and Erythromycin ung 1/4 inch OS.      Last edited by [name], MD on 12/12/2021  1:02 PM.    Pt feels VA OS is slightly more blurred this a.m.  On a new gtt regiment per Dr. [name].  Has an appt w/ Dr. [name] on 1.2.23.  Pt c/o poor depth perception and occasional diplopia.  Referring physician: [name], MD [address] [place],  [place] [address]  HISTORICAL INFORMATION:   Selected notes from the MEDICAL RECORD NUMBER Referred by Dr. [name] for concern of vitreous hemorrhage   CURRENT MEDICATIONS: Current Outpatient Medications (Ophthalmic Drugs)  Medication Sig   Bromfenac Sodium (PROLENSA) 0.07 % SOLN Apply 1 drop to eye in the morning, at noon, in the evening, and at bedtime.   Loteprednol Etabonate (LOTEMAX SM) 0.38 % GEL Place 1 drop into the left eye 4 (four) times daily.   No current facility-administered medications for this visit. (Ophthalmic Drugs)   Current Outpatient Medications (Other)  Medication Sig   amLODipine (NORVASC) 5 MG tablet Take 5 mg by mouth daily.   aspirin EC 81 MG tablet Take 81 mg by mouth daily.     atorvastatin (LIPITOR) 20 MG tablet TAKE 1 TABLET BY MOUTH DAILY   cyclobenzaprine (FLEXERIL) 10 MG tablet Take 10 mg  by mouth every 8 (eight) hours as needed.   fluticasone (FLONASE) 50 MCG/ACT nasal spray Place 2 sprays into both nostrils daily.   levothyroxine (SYNTHROID, LEVOTHROID) 150 MCG tablet Take 150 mcg by mouth See admin instructions. Alternates taking 150 mg daily for 2 days then 175 mcg the third day then repeat   levothyroxine (SYNTHROID, LEVOTHROID) 175 MCG tablet Take 175 mcg by mouth See admin instructions. Alternates taking 150 mg daily for 2 days then 175 mcg the third day then repeat   metFORMIN (GLUCOPHAGE) 500 MG tablet Take 500 mg by mouth daily.   metoprolol tartrate (LOPRESSOR) 25 MG tablet Take 25 mg by mouth 2 (two) times daily.    MITIGARE 0.6 MG CAPS TcT BY MOUTH AT ONE TIME, THEN TAKE 1 CAPSULE ONE HOUR. TAKE AS NEEDED FOR FOR GOUT ATTACKS   mometasone (ELOCON) 0.1 % ointment Apply 1 application topically daily.   naproxen sodium (ANAPROX) 220 MG tablet Take 220 mg by mouth daily as needed (for pain.).    nitroGLYCERIN (NITROSTAT) 0.4 MG SL tablet DISSOLVE 1 TABLET UNDER THE TONGUE EVERY 5 MINUTES AS NEEDED FOR CHEST PAIN. DO NOT EXCEED A TOTAL OF 3 DOSES IN 15 MINUTES.   tamsulosin (FLOMAX) 0.4 MG CAPS capsule Take 0.4 mg by mouth 2 (two) times daily.   telmisartan (  MICARDIS) 80 MG tablet TAKE 1 TABLET BY MOUTH EVERY DAY   methazolamide (NEPTAZANE) 50 MG tablet Take by mouth.   No current facility-administered medications for this visit. (Other)   REVIEW OF SYSTEMS: ROS   Positive for: Musculoskeletal, Cardiovascular, Eyes Negative for: Constitutional, Gastrointestinal, Neurological, Skin, Genitourinary, HENT, Endocrine, Respiratory, Psychiatric, Allergic/Imm, Heme/Lymph Last edited by [name], COA on 12/12/2021  9:15 AM.     ALLERGIES Allergies  Allergen Reactions   Azithromycin Swelling   Shrimp [Shellfish Allergy] Other (See Comments)    Gout flares    PAST MEDICAL HISTORY Past Medical History:  Diagnosis Date   Arthritis    CAD (coronary artery  disease)    Branch vessel and moderate RCA disease 2006   Cancer ([place]) 1990   Melanoma Lower right Leg   Carotid artery disease ([place])    Essential hypertension    Glaucoma    Hyperlipidemia    Hypothyroidism    Lyme disease    PAD (peripheral artery disease) ([place])    Left common iliac stent 2004   Pneumonia    Type 2 diabetes mellitus ([place])    Wears dentures    Wears glasses    Past Surgical History:  Procedure Laterality Date   CATARACT EXTRACTION Bilateral    CATARACT EXTRACTION, BILATERAL     CERVICAL DISC SURGERY     x 1   COLONOSCOPY N/A 11/06/2016   Procedure: COLONOSCOPY;  Surgeon: Daneil Dolin, MD;  [facility];  Service: Endoscopy;  Laterality: N/A;  7:30 AM   COLONOSCOPY  2012   Dr. Anthony Sar: normal. reviewed reports, which states he has a history of polyps in remote past.    EYE SURGERY Bilateral    Cat Sx   EYE SURGERY Left 01/14/2021   [place] - Dr. Jovita Kussmaul   IRIDOTOMY / IRIDECTOMY Left 01/14/2021   [facility] - Dr. Jovita Kussmaul   JOINT REPLACEMENT     LUMBAR WOUND DEBRIDEMENT N/A 01/24/2019   Procedure: LUMBAR WOUND Exploration for Evacation of Seroma vs. Hematoma;  Surgeon: Jovita Gamma, MD;  Location: [place];  Service: Neurosurgery;  Laterality: N/A;   MELANOMA EXCISION     right leg, seen at [facility] and underwent immunotherapy   NECK SURGERY      x 1 yrs ago   PARS PLANA VITRECTOMY Left 04/04/2020   Procedure: PARS PLANA VITRECTOMY WITH 25G REMOVAL/SUTURE INTRAOCULAR LENS;  Surgeon: [name], MD;  Location: [place];  Service: Ophthalmology;  Laterality: Left;   REPLACEMENT TOTAL KNEE Right    WISDOM TOOTH EXTRACTION      FAMILY HISTORY Family History  Problem Relation Age of Onset   Heart disease Mother    Heart attack Mother    Colon polyps Mother    Heart attack Maternal Grandmother    Heart attack Maternal Grandfather    Colon polyps Brother    Colon cancer Neg Hx    SOCIAL HISTORY Social History   Tobacco Use   Smoking status:  Former    Packs/day: 1.50    Years: 40.00    Pack years: 60.00    Types: Cigarettes    Start date: 11/04/1963    Quit date: 10/29/2003    Years since quitting: 18.1   Smokeless tobacco: Never  Vaping Use   Vaping Use: Never used  Substance Use Topics   Alcohol use: Yes    Alcohol/week: 7.0 standard drinks    Types: 7 Standard drinks or equivalent per week  Comment: one Health visitor daily    Drug use: No       OPHTHALMIC EXAM:  Base Eye Exam     Visual Acuity (Snellen - Linear)       Right Left   Dist cc 20/15 20/50   Dist ph cc  20/40 -2    Correction: Glasses         Tonometry (Tonopen, 9:08 AM)       Right Left   Pressure 20 18         Pupils       Dark Light Shape React APD   Right 3 2 Round Brisk None   Left 3 2 Round Brisk None         Visual Fields (Counting fingers)       Left Right    Full Full         Extraocular Movement       Right Left    Full, Ortho Full, Ortho         Neuro/Psych     Oriented x3: Yes   Mood/Affect: Normal         Dilation     Both eyes: 1.0% Mydriacyl, 2.5% Phenylephrine @ 9:08 AM           Slit Lamp and Fundus Exam     External Exam       Right Left   External brow ptosis brow ptosis; mild periorbital edema and erythema         Slit Lamp Exam       Right Left   Lids/Lashes Dermatochalasis - upper lid, Telangiectasia, mild Meibomian gland dysfunction Dermatochalasis, mild Ptosis, Meibomian gland dysfunction   Conjunctiva/Sclera Nasal and temporal Pinguecula temporal Pinguecula, ahmed plate and scleral patch graft ST quad -- nicely healed, bleb ST conj   Cornea mild arcus, trace Punctate epithelial erosions, Debris in tear film mild arcus, trace Punctate epithelial erosions, well healed superior cataract wound, mild Debris in tear film   Anterior Chamber Deep and quiet, no cell/flare Deep, tube at 0100, No cell or flare   Iris No NVI, Round and dilated No NVI, moderately dilated to  4.20mm, Transillumination defects from 0100-0200   Lens Posterior chamber intraocular lens in good position Sutured Akreos IOL in good position   Anterior Vitreous vitreous syneresis post vitrectomy, clear         Fundus Exam       Right Left   Disc Pink and Sharp Pink and Sharp, Compact   C/D Ratio 0.2 0.3   Macula Flat, mild RPE mottling, No heme or edema Flat. blunted foveal reflex, interval re-development in IRF and central pocket of SRF, mild RPE mottling, No heme   Vessels normal, mildly Tortuous Vascular attenuation, mild Tortuousity   Periphery Attached, round focal area of CR atrophy at 0630 mid zone Attached           Refraction     Wearing Rx       Sphere Cylinder Axis Add   Right [place] +0.50 091 +2.25   Left -1.75 Sphere  +2.25    Type: Trifocal            IMAGING AND PROCEDURES  Imaging and Procedures for @TODAY @  OCT, Retina - OU - Both Eyes       Right Eye Quality was good. Central Foveal Thickness: 267. Progression has been stable. Findings include normal foveal contour, no SRF, no IRF (partial PVD - stable).   Left  Eye Quality was good. Central Foveal Thickness: 454. Progression has worsened. Findings include abnormal foveal contour, intraretinal fluid, subretinal scarring (Interval re-development in IRF and central pocket of SRF).   Notes *Images captured and stored on drive  Diagnosis / Impression:  OD: NFP, no IRF, no SRF; partial PVD OS: Interval re-development in IRF and central pocket of SRF  Clinical management:  See below  Abbreviations: NFP - Normal foveal profile. CME - cystoid macular edema. PED - pigment epithelial detachment. IRF - intraretinal fluid. SRF - subretinal fluid. EZ - ellipsoid zone. ERM - epiretinal membrane. ORA - outer retinal atrophy. ORT - outer retinal tubulation. SRHM - subretinal hyper-reflective material            ASSESSMENT/PLAN:    ICD-10-CM   1. Cystoid macular edema of left eye  H35.352 OCT,  Retina - OU - Both Eyes    Bromfenac Sodium (PROLENSA) 0.07 % SOLN    2. 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 Uveitis-hyphema-glaucoma syndrome of left eye  T85.398A    H20.9    H40.42X0     4. Pigmentary glaucoma of left eye, mild stage  H40.1321     5. Dislocated IOL (intraocular lens), anterior, left  H27.122     6. Pseudophakia of both eyes  Z96.1     7. Lyme disease  A69.20     8. Dermatochalasis of both upper eyelids  H02.831    H02.834     9. Brow ptosis  H57.819      1-4. UGH Syndrome OS  - originally: 3 piece PCIOL OS centered, but iris has large TID in sup temp quadrant with IOL haptic within area  - tmax at [facility] 39 OS and was started on max drops (Cosopt, Brim, latan, and po diamox)  - repeat gonio showed open angles OS, mild focal PAS  - s/p PPV w/ IOL exchange / sutured secondary Akreos OS IOL 04.08.21             - today, BCVA OS improved to 20/40 from 20/50  - OCT shows interval re-development in IRF and central pocket of SRF -- recurrent CME             - IOP 18 OS today, 12.16.22  - s/p Ahmed Valve OS on 1.18.22 ([place])  - now released from Dr. Duayne Cal care  - currently on Brimonidine BID OS, Latanoprost QHS OS, Pred BID OS (ran out about a day ago)            - Add Prolensa QID OS (samples given); rx sent in as well  - f/u 4 wks, DFE, OCT  5. CME OS  - mild CME that was improved on 7.21.21 -- stopped Prolensa and started Lotemax taper  - interval re-development of CME (08.18.21)  - s/p STK OS (08.18.21)  - today (12.16.22) interval re-development in IRF and central pocket of SRF -- recurrent CME  - BCVA 20/40  - recommend adding Prolensa QID OS and restarting PF BID OS  - consider switching off latanoprost  6. Pseudophakia OU  - s/p CE/IOL OU -- pt can't remember dates, one eye by [facility] ([facility]Rh), other [facility]  - h/o UGH OS and now sutured IOL as above  - PCIOL OD appears to be in good position  - monitor   7. Lyme disease  - history  of diagnosis from target lesions on lower extremities  - no blood test confirmation performed  -  completed doxycycline per PCP office  - no manifestations in the eye, but will continue to monitor   8,9. Brow ptosis, dermatochalasis OU  - referred to Dr. Vickki Muff (now at [facility]) for eyelid eval  - holding on repair for now   Ophthalmic Meds Ordered this visit:  Meds ordered this encounter  Medications   Bromfenac Sodium (PROLENSA) 0.07 % SOLN    Sig: Apply 1 drop to eye in the morning, at noon, in the evening, and at bedtime.    Dispense:  3 mL    Refill:  4     Return in about 4 weeks (around 01/09/2022) for 4 wk f/u for UGH Syndrome OS w/DFE&OCT.  There are no Patient Instructions on file for this visit.  This document serves as a record of services personally performed by Gardiner Sleeper, MD, PhD. It was created on their behalf by Estill Bakes, COT an ophthalmic technician. The creation of this record is the provider's dictation and/or activities during the visit.    Electronically signed by: Estill Bakes, [place] 12.16.22 @ 1:03 PM   Gardiner Sleeper, M.D., Ph.D. Diseases & Surgery of the Retina and [place] 12/12/2021  I have reviewed the above documentation for accuracy and completeness, and I agree with the above. Gardiner Sleeper, M.D., Ph.D. 12/12/21 1:08 PM   Abbreviations: M myopia (nearsighted); A astigmatism; H hyperopia (farsighted); P presbyopia; Mrx spectacle prescription;  CTL contact lenses; OD right eye; OS left eye; OU both eyes  XT exotropia; ET esotropia; PEK punctate epithelial keratitis; PEE punctate epithelial erosions; DES dry eye syndrome; MGD meibomian gland dysfunction; ATs artificial tears; PFAT's preservative free artificial tears; [place] nuclear sclerotic cataract; PSC posterior subcapsular cataract; ERM epi-retinal membrane; PVD posterior vitreous detachment; RD retinal detachment; DM diabetes mellitus; DR diabetic retinopathy;  NPDR non-proliferative diabetic retinopathy; PDR proliferative diabetic retinopathy; CSME clinically significant macular edema; DME diabetic macular edema; dbh dot blot hemorrhages; CWS cotton wool spot; POAG primary open angle glaucoma; C/D cup-to-disc ratio; HVF humphrey visual field; GVF goldmann visual field; OCT optical coherence tomography; IOP intraocular pressure; BRVO Branch retinal vein occlusion; CRVO central retinal vein occlusion; CRAO central retinal artery occlusion; BRAO branch retinal artery occlusion; RT retinal tear; SB scleral buckle; PPV pars plana vitrectomy; VH Vitreous hemorrhage; PRP panretinal laser photocoagulation; IVK intravitreal kenalog; VMT vitreomacular traction; MH Macular hole;  NVD neovascularization of the disc; NVE neovascularization elsewhere; AREDS age related eye disease study; ARMD age related macular degeneration; POAG primary open angle glaucoma; EBMD epithelial/anterior basement membrane dystrophy; ACIOL anterior chamber intraocular lens; IOL intraocular lens; PCIOL posterior chamber intraocular lens; Phaco/IOL phacoemulsification with intraocular lens placement; [place] photorefractive keratectomy; LASIK laser assisted in situ keratomileusis; HTN hypertension; DM diabetes mellitus; COPD chronic obstructive pulmonary disease

## 2021-12-12 ENCOUNTER — Encounter (INDEPENDENT_AMBULATORY_CARE_PROVIDER_SITE_OTHER): Payer: Self-pay | Admitting: Ophthalmology

## 2021-12-12 ENCOUNTER — Ambulatory Visit (INDEPENDENT_AMBULATORY_CARE_PROVIDER_SITE_OTHER): Payer: Medicare Other | Admitting: Ophthalmology

## 2021-12-12 ENCOUNTER — Other Ambulatory Visit: Payer: Self-pay

## 2021-12-12 DIAGNOSIS — Z961 Presence of intraocular lens: Secondary | ICD-10-CM | POA: Diagnosis not present

## 2021-12-12 DIAGNOSIS — H4042X0 Glaucoma secondary to eye inflammation, left eye, stage unspecified: Secondary | ICD-10-CM | POA: Diagnosis not present

## 2021-12-12 DIAGNOSIS — H02831 Dermatochalasis of right upper eyelid: Secondary | ICD-10-CM

## 2021-12-12 DIAGNOSIS — H27122 Anterior dislocation of lens, left eye: Secondary | ICD-10-CM

## 2021-12-12 DIAGNOSIS — H57819 Brow ptosis, unspecified: Secondary | ICD-10-CM | POA: Diagnosis not present

## 2021-12-12 DIAGNOSIS — H209 Unspecified iridocyclitis: Secondary | ICD-10-CM

## 2021-12-12 DIAGNOSIS — H3581 Retinal edema: Secondary | ICD-10-CM

## 2021-12-12 DIAGNOSIS — H35352 Cystoid macular degeneration, left eye: Secondary | ICD-10-CM

## 2021-12-12 DIAGNOSIS — H401321 Pigmentary glaucoma, left eye, mild stage: Secondary | ICD-10-CM

## 2021-12-12 DIAGNOSIS — A6920 Lyme disease, unspecified: Secondary | ICD-10-CM | POA: Diagnosis not present

## 2021-12-12 DIAGNOSIS — H02834 Dermatochalasis of left upper eyelid: Secondary | ICD-10-CM

## 2021-12-12 MED ORDER — PROLENSA 0.07 % OP SOLN: 1.0000 [drp] | Freq: Four times a day (QID) | OPHTHALMIC | 4 refills | Status: DC

## 2021-12-15 DIAGNOSIS — Z20828 Contact with and (suspected) exposure to other viral communicable diseases: Secondary | ICD-10-CM | POA: Diagnosis not present

## 2021-12-16 DIAGNOSIS — H16223 Keratoconjunctivitis sicca, not specified as Sjogren's, bilateral: Secondary | ICD-10-CM | POA: Diagnosis not present

## 2021-12-16 DIAGNOSIS — Z961 Presence of intraocular lens: Secondary | ICD-10-CM | POA: Diagnosis not present

## 2021-12-16 DIAGNOSIS — E119 Type 2 diabetes mellitus without complications: Secondary | ICD-10-CM | POA: Diagnosis not present

## 2021-12-16 DIAGNOSIS — H35352 Cystoid macular degeneration, left eye: Secondary | ICD-10-CM | POA: Diagnosis not present

## 2021-12-16 DIAGNOSIS — H02834 Dermatochalasis of left upper eyelid: Secondary | ICD-10-CM | POA: Diagnosis not present

## 2021-12-16 DIAGNOSIS — H02831 Dermatochalasis of right upper eyelid: Secondary | ICD-10-CM | POA: Diagnosis not present

## 2021-12-16 DIAGNOSIS — H57813 Brow ptosis, bilateral: Secondary | ICD-10-CM | POA: Diagnosis not present

## 2021-12-16 DIAGNOSIS — H16122 Filamentary keratitis, left eye: Secondary | ICD-10-CM | POA: Diagnosis not present

## 2021-12-16 DIAGNOSIS — T8579XS Infection and inflammatory reaction due to other internal prosthetic devices, implants and grafts, sequela: Secondary | ICD-10-CM | POA: Diagnosis not present

## 2021-12-17 ENCOUNTER — Other Ambulatory Visit (HOSPITAL_COMMUNITY): Payer: Self-pay | Admitting: Cardiology

## 2021-12-17 DIAGNOSIS — I6523 Occlusion and stenosis of bilateral carotid arteries: Secondary | ICD-10-CM

## 2022-01-09 ENCOUNTER — Encounter (INDEPENDENT_AMBULATORY_CARE_PROVIDER_SITE_OTHER): Payer: Medicare Other | Admitting: Ophthalmology

## 2022-01-12 NOTE — Progress Notes (Signed)
[name]  01/13/2022     CHIEF COMPLAINT Patient presents for Retina Follow Up  HISTORY OF PRESENT ILLNESS: Christopher Randolph is a 76 y.o. male who presents to the clinic today for:   HPI     Retina Follow Up   Patient presents with  Other.  In left eye.  This started 4 weeks ago.  I, the attending physician,  performed the HPI with the patient and updated documentation appropriately.        Comments   Patient here for 4 weeks retina follow up for CME OS. Patient states vision doing so,so. No eye pain. Using prednisolone BID OS, Latanoprost QHS OS, and Retasis BID OU.       Last edited by [name], MD on 01/13/2022 12:49 PM.    Pt states he is only using PF BID OS, latanoprost qhs OS and Restasis bid OU, he states he ran out of Prolensa and Dr. [name] told him he could stay off of it, he also told him to stop using brimonidine, he states vision is stable, irritation seems to be better  Referring physician: [name], [address] [place],  [place] [id number]  HISTORICAL INFORMATION:   Selected notes from the MEDICAL RECORD NUMBER Referred by Dr. [name] for concern of vitreous hemorrhage   CURRENT MEDICATIONS: Current Outpatient Medications (Ophthalmic Drugs)  Medication Sig   Bromfenac Sodium (PROLENSA) 0.07 % SOLN Apply 1 drop to eye in the morning, at noon, in the evening, and at bedtime.   Loteprednol Etabonate (LOTEMAX SM) 0.38 % GEL Place 1 drop into the left eye 4 (four) times daily.   No current facility-administered medications for this visit. (Ophthalmic Drugs)   Current Outpatient Medications (Other)  Medication Sig   amLODipine (NORVASC) 5 MG tablet Take 5 mg by mouth daily.   aspirin EC 81 MG tablet Take 81 mg by mouth daily.     atorvastatin (LIPITOR) 20 MG tablet TAKE 1 TABLET BY MOUTH DAILY   cyclobenzaprine (FLEXERIL) 10 MG tablet Take 10 mg by mouth every 8 (eight) hours as needed.    fluticasone (FLONASE) 50 MCG/ACT nasal spray Place 2 sprays into both nostrils daily.   levothyroxine (SYNTHROID, LEVOTHROID) 150 MCG tablet Take 150 mcg by mouth See admin instructions. Alternates taking 150 mg daily for 2 days then 175 mcg the third day then repeat   levothyroxine (SYNTHROID, LEVOTHROID) 175 MCG tablet Take 175 mcg by mouth See admin instructions. Alternates taking 150 mg daily for 2 days then 175 mcg the third day then repeat   metFORMIN (GLUCOPHAGE) 500 MG tablet Take 500 mg by mouth daily.   metoprolol tartrate (LOPRESSOR) 25 MG tablet Take 25 mg by mouth 2 (two) times daily.    MITIGARE 0.6 MG CAPS TcT BY MOUTH AT ONE TIME, THEN TAKE 1 CAPSULE ONE HOUR. TAKE AS NEEDED FOR FOR GOUT ATTACKS   mometasone (ELOCON) 0.1 % ointment Apply 1 application topically daily.   naproxen sodium (ANAPROX) 220 MG tablet Take 220 mg by mouth daily as needed (for pain.).    nitroGLYCERIN (NITROSTAT) 0.4 MG SL tablet DISSOLVE 1 TABLET UNDER THE TONGUE EVERY 5 MINUTES AS NEEDED FOR CHEST PAIN. DO NOT EXCEED A TOTAL OF 3 DOSES IN 15 MINUTES.   tamsulosin (FLOMAX) 0.4 MG CAPS capsule Take 0.4 mg by mouth 2 (two) times daily.   telmisartan (MICARDIS) 80 MG tablet TAKE 1 TABLET BY MOUTH EVERY DAY  methazolamide (NEPTAZANE) 50 MG tablet Take by mouth.   No current facility-administered medications for this visit. (Other)   REVIEW OF SYSTEMS: ROS   Positive for: Musculoskeletal, Cardiovascular, Eyes Negative for: Constitutional, Gastrointestinal, Neurological, Skin, Genitourinary, HENT, Endocrine, Respiratory, Psychiatric, Allergic/Imm, Heme/Lymph Last edited by [name], COA on 01/13/2022  8:47 AM.      ALLERGIES Allergies  Allergen Reactions   Azithromycin Swelling   Shrimp [Shellfish Allergy] Other (See Comments)    Gout flares    PAST MEDICAL HISTORY Past Medical History:  Diagnosis Date   Arthritis    CAD (coronary artery disease)    Branch vessel and moderate RCA disease  2006   Cancer ([place]) 1990   Melanoma Lower right Leg   Carotid artery disease ([place])    Essential hypertension    Glaucoma    Hyperlipidemia    Hypothyroidism    Lyme disease    PAD (peripheral artery disease) ([place])    Left common iliac stent 2004   Pneumonia    Type 2 diabetes mellitus ([place])    Wears dentures    Wears glasses    Past Surgical History:  Procedure Laterality Date   CATARACT EXTRACTION Bilateral    CATARACT EXTRACTION, BILATERAL     CERVICAL DISC SURGERY     x 1   COLONOSCOPY N/A 11/06/2016   Procedure: COLONOSCOPY;  Surgeon: Daneil Dolin, MD;  [facility];  Service: Endoscopy;  Laterality: N/A;  7:30 AM   COLONOSCOPY  2012   Dr. Anthony Sar: normal. reviewed reports, which states he has a history of polyps in remote past.    EYE SURGERY Bilateral    Cat Sx   EYE SURGERY Left 01/14/2021   [facility] - Dr. Jovita Kussmaul   IRIDOTOMY / IRIDECTOMY Left 01/14/2021   [facility] - Dr. Jovita Kussmaul   JOINT REPLACEMENT     LUMBAR WOUND DEBRIDEMENT N/A 01/24/2019   Procedure: LUMBAR WOUND Exploration for Evacation of Seroma vs. Hematoma;  Surgeon: Jovita Gamma, MD;  Location: [facility];  Service: Neurosurgery;  Laterality: N/A;   MELANOMA EXCISION     right leg, seen at [facility] and underwent immunotherapy   NECK SURGERY      x 1 yrs ago   PARS PLANA VITRECTOMY Left 04/04/2020   Procedure: PARS PLANA VITRECTOMY WITH 25G REMOVAL/SUTURE INTRAOCULAR LENS;  Surgeon: [name], MD;  Location: [facility];  Service: Ophthalmology;  Laterality: Left;   REPLACEMENT TOTAL KNEE Right    WISDOM TOOTH EXTRACTION      FAMILY HISTORY Family History  Problem Relation Age of Onset   Heart disease Mother    Heart attack Mother    Colon polyps Mother    Heart attack Maternal Grandmother    Heart attack Maternal Grandfather    Colon polyps Brother    Colon cancer Neg Hx    SOCIAL HISTORY Social History   Tobacco Use   Smoking status: Former    Packs/day: 1.50    Years: 40.00    Pack  years: 60.00    Types: Cigarettes    Start date: 11/04/1963    Quit date: 10/29/2003    Years since quitting: 18.2   Smokeless tobacco: Never  Vaping Use   Vaping Use: Never used  Substance Use Topics   Alcohol use: Yes    Alcohol/week: 7.0 standard drinks    Types: 7 Standard drinks or equivalent per week    Comment: one miller lite daily    Drug use:  No       OPHTHALMIC EXAM:  Base Eye Exam     Visual Acuity (Snellen - Linear)       Right Left   Dist cc 20/15 20/50   Dist ph cc  20/30    Correction: Glasses         Tonometry (Tonopen, 8:44 AM)       Right Left   Pressure 16 15         Pupils       Dark Light Shape React APD   Right 3 2 Round Brisk None   Left 3 2 Round Brisk None         Visual Fields (Counting fingers)       Left Right    Full Full         Extraocular Movement       Right Left    Full, Ortho Full, Ortho         Neuro/Psych     Oriented x3: Yes   Mood/Affect: Normal         Dilation     Both eyes: 1.0% Mydriacyl, 2.5% Phenylephrine @ 8:44 AM           Slit Lamp and Fundus Exam     External Exam       Right Left   External brow ptosis brow ptosis; mild periorbital edema and erythema         Slit Lamp Exam       Right Left   Lids/Lashes Dermatochalasis - upper lid, Telangiectasia, mild Meibomian gland dysfunction Dermatochalasis, mild Ptosis, Meibomian gland dysfunction, Telangiectasia   Conjunctiva/Sclera Nasal and temporal Pinguecula temporal Pinguecula, ahmed plate and scleral patch graft ST quad -- nicely healed, bleb ST conj   Cornea mild arcus, trace Punctate epithelial erosions, Debris in tear film mild arcus, trace Punctate epithelial erosions, well healed superior cataract wound, mild Debris in tear film   Anterior Chamber Deep and quiet, no cell/flare Deep, tube at 0100, No cell or pigment   Iris No NVI, Round and moderately dilated No NVI, moderately dilated to 4.42mm, Transillumination defects  from 0100-0200   Lens Posterior chamber intraocular lens in good position Sutured Akreos IOL in good position   Anterior Vitreous vitreous syneresis post vitrectomy, clear         Fundus Exam       Right Left   Disc Pink and Sharp Pink and Sharp, Compact   C/D Ratio 0.2 0.3   Macula Flat, mild RPE mottling, No heme or edema Flat. blunted foveal reflex, interval improvement in IRF and central pocket of SRF, mild RPE mottling, No heme   Vessels normal, mildly Tortuous Vascular attenuation, mild Tortuousity   Periphery Attached, round focal area of CR atrophy at 0630 mid zone Attached           Refraction     Wearing Rx       Sphere Cylinder Axis Add   Right [place] +0.50 091 +2.25   Left -1.75 Sphere  +2.25    Type: Trifocal            IMAGING AND PROCEDURES  Imaging and Procedures for @TODAY @  OCT, Retina - OU - Both Eyes       Right Eye Quality was good. Central Foveal Thickness: 266. Progression has been stable. Findings include normal foveal contour, no SRF, no IRF (partial PVD - stable).   Left Eye Quality was good. Central Foveal Thickness: 297. Progression  has improved. Findings include normal foveal contour, no IRF, no SRF (Interval improvement in IRF and central pocket of SRF -- resolved, back to baseline).   Notes *Images captured and stored on drive  Diagnosis / Impression:  OD: NFP, no IRF, no SRF; partial PVD OS: Interval improvement in IRF and central pocket of SRF -- resolved, back to baseline  Clinical management:  See below  Abbreviations: NFP - Normal foveal profile. CME - cystoid macular edema. PED - pigment epithelial detachment. IRF - intraretinal fluid. SRF - subretinal fluid. EZ - ellipsoid zone. ERM - epiretinal membrane. ORA - outer retinal atrophy. ORT - outer retinal tubulation. SRHM - subretinal hyper-reflective material            ASSESSMENT/PLAN:    ICD-10-CM   1. Uveitis-hyphema-glaucoma syndrome of left eye  T85.398A  OCT, Retina - OU - Both Eyes   H20.9    H40.42X0     2. 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 Pigmentary glaucoma of left eye, mild stage  H40.1321     4. Dislocated IOL (intraocular lens), anterior, left  H27.122     5. Cystoid macular edema of left eye  H35.352 OCT, Retina - OU - Both Eyes    6. Pseudophakia of both eyes  Z96.1     7. Lyme disease  A69.20     8. Dermatochalasis of both upper eyelids  H02.831    H02.834     9. Brow ptosis  H57.819       1-4. UGH Syndrome OS  - originally: 3 piece PCIOL OS centered, but iris has large TID in sup temp quadrant with IOL haptic within area  - [facility] at [facility] 39 OS and was started on max drops (Cosopt, Brim, latan, and po diamox)  - repeat gonio showed open angles OS, mild focal PAS  - s/p PPV w/ IOL exchange / sutured secondary Akreos OS IOL 04.08.21             - today, doing well, BCVA OS improved to 20/40 from 20/50  - OCT shows interval improvement in IRF and central pocket of SRF -- recurrent CME resolved             - IOP 15 OS today, 01.17.2  - s/p Ahmed Valve OS on 1.18.22 ([place])  - now released from Dr. Duayne Cal care  - currently on Latanoprost QHS OS and Pred BID OS  (Prolensa and Brim stopped per Dr. [name])  - f/u 2-3 mos, DFE, OCT  5. CME OS  - mild CME that was improved on 7.21.21  - interval re-development of CME (08.18.21 and 12.16.22)  - s/p STK OS (08.18.21)  - restarted PF and Prolensa QID OS on 12.16.22, Prolensa stopped per Dr. [name]  - today (01.17.23) interval improvement in IRF and central pocket of SRF -- resolved, back to baseline  - BCVA 20/30  - continue PF BID OS -- pt stopped Prolensa 3 weeks ago  - monitor, f/u 2-3 mos  6. Pseudophakia OU  - s/p CE/IOL OU -- pt can't remember dates, one eye by [facility], other eye by [facility]  - h/o UGH OS and now sutured IOL as above  - PCIOL OD appears to be in good position  - sutured Akreos IOL OS in good position  - monitor   7. Lyme  disease  - history of diagnosis from target lesions on lower extremities  - no blood test confirmation performed  -  completed doxycycline per PCP office  - no manifestations in the eye, but will continue to monitor   8,9. Brow ptosis, dermatochalasis OU  - referred to Dr. Vickki Muff (now at [facility]) for eyelid eval  - holding on repair for now   Ophthalmic Meds Ordered this visit:  No orders of the defined types were placed in this encounter.    Return for 2-3 mos - UGH syndrome, CME OS, Dilated Exam, OCT.  There are no Patient Instructions on file for this visit.  This document serves as a record of services personally performed by Gardiner Sleeper, MD, PhD. It was created on their behalf by Estill Bakes, COT an ophthalmic technician. The creation of this record is the provider's dictation and/or activities during the visit.    Electronically signed by: Estill Bakes, COT 1.16.23 @ 12:57 PM   This document serves as a record of services personally performed by Gardiner Sleeper, MD, PhD. It was created on their behalf by San Jetty. Owens Shark, OA an ophthalmic technician. The creation of this record is the provider's dictation and/or activities during the visit.    Electronically signed by: San Jetty. Marguerita Merles 01.17.2023 12:57 PM   Gardiner Sleeper, M.D., Ph.D. Diseases & Surgery of the Retina and Vitreous [facility]  I have reviewed the above documentation for accuracy and completeness, and I agree with the above. Gardiner Sleeper, M.D., Ph.D. 01/13/22 12:57 PM   Abbreviations: M myopia (nearsighted); A astigmatism; H hyperopia (farsighted); P presbyopia; Mrx spectacle prescription;  CTL contact lenses; OD right eye; OS left eye; OU both eyes  XT exotropia; ET esotropia; PEK punctate epithelial keratitis; PEE punctate epithelial erosions; DES dry eye syndrome; MGD meibomian gland dysfunction; ATs artificial tears; PFAT's preservative free artificial tears; [place] nuclear  sclerotic cataract; PSC posterior subcapsular cataract; ERM epi-retinal membrane; PVD posterior vitreous detachment; RD retinal detachment; DM diabetes mellitus; DR diabetic retinopathy; NPDR non-proliferative diabetic retinopathy; PDR proliferative diabetic retinopathy; CSME clinically significant macular edema; DME diabetic macular edema; dbh dot blot hemorrhages; CWS cotton wool spot; POAG primary open angle glaucoma; C/D cup-to-disc ratio; HVF humphrey visual field; GVF goldmann visual field; OCT optical coherence tomography; IOP intraocular pressure; BRVO Branch retinal vein occlusion; CRVO central retinal vein occlusion; CRAO central retinal artery occlusion; BRAO branch retinal artery occlusion; RT retinal tear; SB scleral buckle; PPV pars plana vitrectomy; VH Vitreous hemorrhage; PRP panretinal laser photocoagulation; IVK intravitreal kenalog; VMT vitreomacular traction; MH Macular hole;  NVD neovascularization of the disc; NVE neovascularization elsewhere; AREDS age related eye disease study; ARMD age related macular degeneration; POAG primary open angle glaucoma; EBMD epithelial/anterior basement membrane dystrophy; ACIOL anterior chamber intraocular lens; IOL intraocular lens; PCIOL posterior chamber intraocular lens; Phaco/IOL phacoemulsification with intraocular lens placement; [place] photorefractive keratectomy; LASIK laser assisted in situ keratomileusis; HTN hypertension; DM diabetes mellitus; COPD chronic obstructive pulmonary disease

## 2022-01-13 ENCOUNTER — Ambulatory Visit (INDEPENDENT_AMBULATORY_CARE_PROVIDER_SITE_OTHER): Payer: Medicare Other | Admitting: Ophthalmology

## 2022-01-13 ENCOUNTER — Encounter (INDEPENDENT_AMBULATORY_CARE_PROVIDER_SITE_OTHER): Payer: Self-pay | Admitting: Ophthalmology

## 2022-01-13 ENCOUNTER — Other Ambulatory Visit: Payer: Self-pay

## 2022-01-13 DIAGNOSIS — H35352 Cystoid macular degeneration, left eye: Secondary | ICD-10-CM | POA: Diagnosis not present

## 2022-01-13 DIAGNOSIS — H02831 Dermatochalasis of right upper eyelid: Secondary | ICD-10-CM | POA: Diagnosis not present

## 2022-01-13 DIAGNOSIS — A6920 Lyme disease, unspecified: Secondary | ICD-10-CM | POA: Diagnosis not present

## 2022-01-13 DIAGNOSIS — H401321 Pigmentary glaucoma, left eye, mild stage: Secondary | ICD-10-CM

## 2022-01-13 DIAGNOSIS — H02834 Dermatochalasis of left upper eyelid: Secondary | ICD-10-CM | POA: Diagnosis not present

## 2022-01-13 DIAGNOSIS — Z961 Presence of intraocular lens: Secondary | ICD-10-CM | POA: Diagnosis not present

## 2022-01-13 DIAGNOSIS — H4042X0 Glaucoma secondary to eye inflammation, left eye, stage unspecified: Secondary | ICD-10-CM

## 2022-01-13 DIAGNOSIS — H209 Unspecified iridocyclitis: Secondary | ICD-10-CM

## 2022-01-13 DIAGNOSIS — H57819 Brow ptosis, unspecified: Secondary | ICD-10-CM

## 2022-01-13 DIAGNOSIS — H27122 Anterior dislocation of lens, left eye: Secondary | ICD-10-CM | POA: Diagnosis not present

## 2022-01-13 DIAGNOSIS — T85398A Other mechanical complication of other ocular prosthetic devices, implants and grafts, initial encounter: Secondary | ICD-10-CM

## 2022-01-15 DIAGNOSIS — S46392D Other injury of muscle, fascia and tendon of triceps, left arm, subsequent encounter: Secondary | ICD-10-CM | POA: Diagnosis not present

## 2022-01-15 DIAGNOSIS — M25522 Pain in left elbow: Secondary | ICD-10-CM | POA: Diagnosis not present

## 2022-01-15 DIAGNOSIS — Z4789 Encounter for other orthopedic aftercare: Secondary | ICD-10-CM | POA: Diagnosis not present

## 2022-01-30 DIAGNOSIS — E782 Mixed hyperlipidemia: Secondary | ICD-10-CM | POA: Diagnosis not present

## 2022-01-30 DIAGNOSIS — E1151 Type 2 diabetes mellitus with diabetic peripheral angiopathy without gangrene: Secondary | ICD-10-CM | POA: Diagnosis not present

## 2022-01-30 DIAGNOSIS — E7849 Other hyperlipidemia: Secondary | ICD-10-CM | POA: Diagnosis not present

## 2022-01-30 DIAGNOSIS — E039 Hypothyroidism, unspecified: Secondary | ICD-10-CM | POA: Diagnosis not present

## 2022-01-30 DIAGNOSIS — E1169 Type 2 diabetes mellitus with other specified complication: Secondary | ICD-10-CM | POA: Diagnosis not present

## 2022-01-30 DIAGNOSIS — I10 Essential (primary) hypertension: Secondary | ICD-10-CM | POA: Diagnosis not present

## 2022-01-30 DIAGNOSIS — J439 Emphysema, unspecified: Secondary | ICD-10-CM | POA: Diagnosis not present

## 2022-02-03 DIAGNOSIS — E039 Hypothyroidism, unspecified: Secondary | ICD-10-CM | POA: Diagnosis not present

## 2022-02-03 DIAGNOSIS — E1151 Type 2 diabetes mellitus with diabetic peripheral angiopathy without gangrene: Secondary | ICD-10-CM | POA: Diagnosis not present

## 2022-02-03 DIAGNOSIS — Z1389 Encounter for screening for other disorder: Secondary | ICD-10-CM | POA: Diagnosis not present

## 2022-02-03 DIAGNOSIS — J439 Emphysema, unspecified: Secondary | ICD-10-CM | POA: Diagnosis not present

## 2022-02-03 DIAGNOSIS — I25119 Atherosclerotic heart disease of native coronary artery with unspecified angina pectoris: Secondary | ICD-10-CM | POA: Diagnosis not present

## 2022-02-03 DIAGNOSIS — I10 Essential (primary) hypertension: Secondary | ICD-10-CM | POA: Diagnosis not present

## 2022-02-10 DIAGNOSIS — H02834 Dermatochalasis of left upper eyelid: Secondary | ICD-10-CM | POA: Diagnosis not present

## 2022-02-10 DIAGNOSIS — H35352 Cystoid macular degeneration, left eye: Secondary | ICD-10-CM | POA: Diagnosis not present

## 2022-02-10 DIAGNOSIS — H57813 Brow ptosis, bilateral: Secondary | ICD-10-CM | POA: Diagnosis not present

## 2022-02-10 DIAGNOSIS — E119 Type 2 diabetes mellitus without complications: Secondary | ICD-10-CM | POA: Diagnosis not present

## 2022-02-10 DIAGNOSIS — Z961 Presence of intraocular lens: Secondary | ICD-10-CM | POA: Diagnosis not present

## 2022-02-10 DIAGNOSIS — H16122 Filamentary keratitis, left eye: Secondary | ICD-10-CM | POA: Diagnosis not present

## 2022-02-10 DIAGNOSIS — H02831 Dermatochalasis of right upper eyelid: Secondary | ICD-10-CM | POA: Diagnosis not present

## 2022-02-10 DIAGNOSIS — H16223 Keratoconjunctivitis sicca, not specified as Sjogren's, bilateral: Secondary | ICD-10-CM | POA: Diagnosis not present

## 2022-02-10 DIAGNOSIS — T8579XS Infection and inflammatory reaction due to other internal prosthetic devices, implants and grafts, sequela: Secondary | ICD-10-CM | POA: Diagnosis not present

## 2022-02-25 DIAGNOSIS — G459 Transient cerebral ischemic attack, unspecified: Secondary | ICD-10-CM

## 2022-02-25 HISTORY — DX: Transient cerebral ischemic attack, unspecified: G45.9

## 2022-02-26 DIAGNOSIS — G459 Transient cerebral ischemic attack, unspecified: Secondary | ICD-10-CM

## 2022-02-26 HISTORY — DX: Transient cerebral ischemic attack, unspecified: G45.9

## 2022-02-27 ENCOUNTER — Emergency Department (HOSPITAL_COMMUNITY): Payer: Medicare Other

## 2022-02-27 ENCOUNTER — Encounter (HOSPITAL_COMMUNITY): Payer: Self-pay | Admitting: Internal Medicine

## 2022-02-27 ENCOUNTER — Observation Stay (HOSPITAL_COMMUNITY)
Admission: EM | Admit: 2022-02-27 | Discharge: 2022-02-28 | Disposition: A | Discharge status: Home / Self Care | Payer: Medicare Other | Attending: Internal Medicine | Admitting: Internal Medicine

## 2022-02-27 ENCOUNTER — Other Ambulatory Visit: Payer: Self-pay

## 2022-02-27 DIAGNOSIS — G453 Amaurosis fugax: Secondary | ICD-10-CM | POA: Diagnosis not present

## 2022-02-27 DIAGNOSIS — R0789 Other chest pain: Secondary | ICD-10-CM

## 2022-02-27 DIAGNOSIS — G43909 Migraine, unspecified, not intractable, without status migrainosus: Secondary | ICD-10-CM | POA: Diagnosis not present

## 2022-02-27 DIAGNOSIS — Z87891 Personal history of nicotine dependence: Secondary | ICD-10-CM

## 2022-02-27 DIAGNOSIS — Z85828 Personal history of other malignant neoplasm of skin: Secondary | ICD-10-CM | POA: Insufficient documentation

## 2022-02-27 DIAGNOSIS — R29818 Other symptoms and signs involving the nervous system: Secondary | ICD-10-CM | POA: Diagnosis not present

## 2022-02-27 DIAGNOSIS — Z20822 Contact with and (suspected) exposure to covid-19: Secondary | ICD-10-CM | POA: Diagnosis not present

## 2022-02-27 DIAGNOSIS — E785 Hyperlipidemia, unspecified: Secondary | ICD-10-CM | POA: Diagnosis present

## 2022-02-27 DIAGNOSIS — I2510 Atherosclerotic heart disease of native coronary artery without angina pectoris: Secondary | ICD-10-CM | POA: Diagnosis present

## 2022-02-27 DIAGNOSIS — Z7984 Long term (current) use of oral hypoglycemic drugs: Secondary | ICD-10-CM | POA: Insufficient documentation

## 2022-02-27 DIAGNOSIS — E039 Hypothyroidism, unspecified: Secondary | ICD-10-CM | POA: Diagnosis present

## 2022-02-27 DIAGNOSIS — M47812 Spondylosis without myelopathy or radiculopathy, cervical region: Secondary | ICD-10-CM | POA: Diagnosis not present

## 2022-02-27 DIAGNOSIS — J9811 Atelectasis: Secondary | ICD-10-CM | POA: Diagnosis not present

## 2022-02-27 DIAGNOSIS — E119 Type 2 diabetes mellitus without complications: Secondary | ICD-10-CM | POA: Insufficient documentation

## 2022-02-27 DIAGNOSIS — H53121 Transient visual loss, right eye: Secondary | ICD-10-CM | POA: Diagnosis not present

## 2022-02-27 DIAGNOSIS — Z7982 Long term (current) use of aspirin: Secondary | ICD-10-CM | POA: Insufficient documentation

## 2022-02-27 DIAGNOSIS — I10 Essential (primary) hypertension: Secondary | ICD-10-CM | POA: Diagnosis not present

## 2022-02-27 DIAGNOSIS — Z79899 Other long term (current) drug therapy: Secondary | ICD-10-CM | POA: Diagnosis not present

## 2022-02-27 DIAGNOSIS — R079 Chest pain, unspecified: Secondary | ICD-10-CM

## 2022-02-27 DIAGNOSIS — I6523 Occlusion and stenosis of bilateral carotid arteries: Secondary | ICD-10-CM | POA: Diagnosis not present

## 2022-02-27 DIAGNOSIS — Z96651 Presence of right artificial knee joint: Secondary | ICD-10-CM | POA: Diagnosis not present

## 2022-02-27 DIAGNOSIS — G459 Transient cerebral ischemic attack, unspecified: Secondary | ICD-10-CM | POA: Diagnosis not present

## 2022-02-27 DIAGNOSIS — I672 Cerebral atherosclerosis: Secondary | ICD-10-CM | POA: Diagnosis not present

## 2022-02-27 DIAGNOSIS — I739 Peripheral vascular disease, unspecified: Secondary | ICD-10-CM | POA: Diagnosis present

## 2022-02-27 DIAGNOSIS — Z7901 Long term (current) use of anticoagulants: Secondary | ICD-10-CM | POA: Diagnosis not present

## 2022-02-27 DIAGNOSIS — I779 Disorder of arteries and arterioles, unspecified: Secondary | ICD-10-CM | POA: Diagnosis present

## 2022-02-27 DIAGNOSIS — H5461 Unqualified visual loss, right eye, normal vision left eye: Secondary | ICD-10-CM | POA: Diagnosis not present

## 2022-02-27 DIAGNOSIS — R001 Bradycardia, unspecified: Secondary | ICD-10-CM | POA: Diagnosis not present

## 2022-02-27 LAB — RESP PANEL BY RT-PCR (FLU A&B, COVID) ARPGX2
Influenza A by PCR: NEGATIVE
Influenza B by PCR: NEGATIVE
SARS Coronavirus 2 by RT PCR: NEGATIVE

## 2022-02-27 LAB — BASIC METABOLIC PANEL
Anion gap: 10 (ref 5–15)
BUN: 21 mg/dL (ref 8–23)
CO2: 25 mmol/L (ref 22–32)
Calcium: 9 mg/dL (ref 8.9–10.3)
Chloride: 105 mmol/L (ref 98–111)
Creatinine, Ser: 0.87 mg/dL (ref 0.61–1.24)
GFR, Estimated: >60 mL/min (ref >60)
Glucose, Bld: 136 mg/dL — ABNORMAL HIGH (ref 70–99)
Potassium: 3.7 mmol/L (ref 3.5–5.1)
Sodium: 140 mmol/L (ref 135–145)

## 2022-02-27 LAB — CBC
HCT: 40.5 % (ref 39.0–52.0)
Hemoglobin: 13.7 g/dL (ref 13.0–17.0)
MCH: 31 pg (ref 26.0–34.0)
MCHC: 33.8 g/dL (ref 30.0–36.0)
MCV: 91.6 fL (ref 80.0–100.0)
Platelets: 127 10*3/uL — ABNORMAL LOW (ref 150–400)
RBC: 4.42 MIL/uL (ref 4.22–5.81)
RDW: 13.3 % (ref 11.5–15.5)
WBC: 6.5 10*3/uL (ref 4.0–10.5)
nRBC: 0 % (ref 0.0–0.2)

## 2022-02-27 LAB — TROPONIN I (HIGH SENSITIVITY)
Troponin I (High Sensitivity): 7 ng/L (ref <18)
Troponin I (High Sensitivity): 7 ng/L (ref <18)

## 2022-02-27 MED ORDER — COLCHICINE 0.6 MG PO TABS: 0.6000 mg | ORAL_TABLET | ORAL | Status: DC | PRN

## 2022-02-27 MED ORDER — GADOBUTROL 1 MMOL/ML IV SOLN
10.0000 mL | Freq: Once | INTRAVENOUS | Status: AC | PRN
  Administered 2022-02-27: 10 mL via INTRAVENOUS

## 2022-02-27 MED ORDER — INSULIN ASPART 100 UNIT/ML IJ SOLN: 0.0000 [IU] | Freq: Every day | INTRAMUSCULAR | Status: DC

## 2022-02-27 MED ORDER — IOHEXOL 350 MG/ML SOLN
75.0000 mL | Freq: Once | INTRAVENOUS | Status: AC | PRN
  Administered 2022-02-27: 75 mL via INTRAVENOUS

## 2022-02-27 MED ORDER — INSULIN ASPART 100 UNIT/ML IJ SOLN
0.0000 [IU] | Freq: Three times a day (TID) | INTRAMUSCULAR | Status: DC
  Administered 2022-02-28: 2 [IU] via SUBCUTANEOUS

## 2022-02-27 MED ORDER — METOPROLOL TARTRATE 25 MG PO TABS
25.0000 mg | ORAL_TABLET | Freq: Two times a day (BID) | ORAL | Status: DC
  Administered 2022-02-28: 25 mg via ORAL
  Filled 2022-02-27: qty 1

## 2022-02-27 MED ORDER — HEPARIN SODIUM (PORCINE) 5000 UNIT/ML IJ SOLN
5000.0000 [IU] | Freq: Three times a day (TID) | INTRAMUSCULAR | Status: DC
  Administered 2022-02-28 (×2): 5000 [IU] via SUBCUTANEOUS
  Filled 2022-02-27 (×2): qty 1

## 2022-02-27 MED ORDER — TAMSULOSIN HCL 0.4 MG PO CAPS
0.8000 mg | ORAL_CAPSULE | Freq: Every day | ORAL | Status: DC
  Administered 2022-02-28: 0.8 mg via ORAL
  Filled 2022-02-27: qty 2

## 2022-02-27 MED ORDER — LEVOTHYROXINE SODIUM 75 MCG PO TABS: 150.0000 ug | ORAL_TABLET | ORAL | Status: DC

## 2022-02-27 MED ORDER — ACETAMINOPHEN 325 MG PO TABS: 650.0000 mg | ORAL_TABLET | ORAL | Status: DC | PRN

## 2022-02-27 MED ORDER — NITROGLYCERIN 0.4 MG SL SUBL: 0.4000 mg | SUBLINGUAL_TABLET | SUBLINGUAL | Status: DC | PRN

## 2022-02-27 MED ORDER — PREDNISOLONE ACETATE 1 % OP SUSP
1.0000 [drp] | Freq: Every day | OPHTHALMIC | Status: DC
  Administered 2022-02-28: 1 [drp] via OPHTHALMIC
  Filled 2022-02-27: qty 1

## 2022-02-27 MED ORDER — IRBESARTAN 150 MG PO TABS
300.0000 mg | ORAL_TABLET | Freq: Every day | ORAL | Status: DC
Start: 2022-02-28 — End: 2022-02-28
  Administered 2022-02-28: 300 mg via ORAL
  Filled 2022-02-27: qty 2

## 2022-02-27 MED ORDER — ASPIRIN EC 81 MG PO TBEC
81.0000 mg | DELAYED_RELEASE_TABLET | Freq: Every day | ORAL | Status: DC
  Administered 2022-02-28: 81 mg via ORAL
  Filled 2022-02-27: qty 1

## 2022-02-27 MED ORDER — SENNOSIDES-DOCUSATE SODIUM 8.6-50 MG PO TABS: 1.0000 | ORAL_TABLET | Freq: Every evening | ORAL | Status: DC | PRN

## 2022-02-27 MED ORDER — CYCLOSPORINE 0.05 % OP EMUL
1.0000 [drp] | Freq: Two times a day (BID) | OPHTHALMIC | Status: DC
  Administered 2022-02-28: 1 [drp] via OPHTHALMIC
  Filled 2022-02-27: qty 30

## 2022-02-27 MED ORDER — ACETAMINOPHEN 650 MG RE SUPP: 650.0000 mg | RECTAL | Status: DC | PRN

## 2022-02-27 MED ORDER — STROKE: EARLY STAGES OF RECOVERY BOOK
Freq: Once | Status: DC
  Filled 2022-02-27: qty 1

## 2022-02-27 MED ORDER — ACETAMINOPHEN 160 MG/5ML PO SOLN: 650.0000 mg | ORAL | Status: DC | PRN

## 2022-02-27 MED ORDER — FLUTICASONE PROPIONATE 50 MCG/ACT NA SUSP
2.0000 | Freq: Every day | NASAL | Status: DC
  Administered 2022-02-28: 2 via NASAL
  Filled 2022-02-27: qty 16

## 2022-02-27 MED ORDER — LATANOPROST 0.005 % OP SOLN: 1.0000 [drp] | Freq: Every day | OPHTHALMIC | Status: DC

## 2022-02-27 MED ORDER — AMLODIPINE BESYLATE 5 MG PO TABS
10.0000 mg | ORAL_TABLET | Freq: Every day | ORAL | Status: DC
  Administered 2022-02-28: 10 mg via ORAL
  Filled 2022-02-27: qty 2

## 2022-02-27 MED ORDER — ATORVASTATIN CALCIUM 20 MG PO TABS
20.0000 mg | ORAL_TABLET | Freq: Every day | ORAL | Status: DC
  Administered 2022-02-28: 20 mg via ORAL
  Filled 2022-02-27: qty 1

## 2022-02-27 NOTE — Consult Note (Signed)
Cardiology Consultation:   Patient ID: BUDD FREIERMUTH MRN: [id number]; DOB: May 12, 1946  Admit date: 02/27/2022 Date of Consult: 02/27/2022  PCP:  Curlene Labrum, MD   [facility] Providers Cardiologist:  Rozann Lesches, MD        Patient Profile:   LEVELLE EDELEN is a 76 y.o. male with a hx of CAD who is being seen 02/27/2022 for the evaluation of chest pain at the request of Dr Alvino Chapel. Marland Kitchen  History of Present Illness:   Mr. Dorton 76 yo male history of CAD with prior cath showing moderate RCA disease managed medically, carotid stenosis, HL presents with vision changes and chest pain. Cardiology consulted for chest pain symptoms  Reports chest discomfort mid chest 1-2/10 at times. Can occur at rest or with activity. At times some associated SOB. Not positional. Reports he thinks similar to the symptoms he had last yeaer when he had a nuclear stress test which showed no significant ishcemia.   K 3.7 Cr 0.87 BUN 21 WBC 6.5 Hgb 13.7 Plt 127 Trop 7-->7 CXR no acute process EKG sinus brady, no ischemic changes   04/2021 nuclear stress [facility]: no ischemia   Past Medical History:  Diagnosis Date   Arthritis    CAD (coronary artery disease)    Neftali Thurow vessel and moderate RCA disease 2006   Cancer ([place]) 1990   Melanoma Lower right Leg   Carotid artery disease ([place])    Essential hypertension    Glaucoma    Hyperlipidemia    Hypothyroidism    Lyme disease    PAD (peripheral artery disease) ([facility])    Left common iliac stent 2004   Pneumonia    Type 2 diabetes mellitus ([place])    Wears dentures    Wears glasses     Past Surgical History:  Procedure Laterality Date   CATARACT EXTRACTION Bilateral    CATARACT EXTRACTION, BILATERAL     CERVICAL DISC SURGERY     x 1   COLONOSCOPY N/A 11/06/2016   Procedure: COLONOSCOPY;  Surgeon: Daneil Dolin, MD;  [facility];  Service: Endoscopy;  Laterality: N/A;  7:30 AM   COLONOSCOPY  2012   Dr. Anthony Sar: normal. reviewed  reports, which states he has a history of polyps in remote past.    EYE SURGERY Bilateral    Cat Sx   EYE SURGERY Left 01/14/2021   [place] - Dr. Jovita Kussmaul   IRIDOTOMY / IRIDECTOMY Left 01/14/2021   [place] - Dr. Jovita Kussmaul   JOINT REPLACEMENT     LUMBAR WOUND DEBRIDEMENT N/A 01/24/2019   Procedure: LUMBAR WOUND Exploration for Evacation of Seroma vs. Hematoma;  Surgeon: Jovita Gamma, MD;  Location: [place];  Service: Neurosurgery;  Laterality: N/A;   MELANOMA EXCISION     right leg, seen at [facility] and underwent immunotherapy   NECK SURGERY      x 1 yrs ago   PARS PLANA VITRECTOMY Left 04/04/2020   Procedure: PARS PLANA VITRECTOMY WITH 25G REMOVAL/SUTURE INTRAOCULAR LENS;  Surgeon: [name], MD;  Location: [place];  Service: Ophthalmology;  Laterality: Left;   REPLACEMENT TOTAL KNEE Right    WISDOM TOOTH EXTRACTION       Inpatient Medications: Scheduled Meds:  Continuous Infusions:  PRN Meds:   Allergies:    Allergies  Allergen Reactions   Erythromycin Swelling    swelling of lips swelling of lips swelling of lips swelling of lips    Shellfish Allergy Other (See Comments) and Rash  Gout flares Gout flares Gout flares Gout flares   Azithromycin Swelling    Social History:   Social History   Socioeconomic History   Marital status: Married    Spouse name: Not on file   Number of children: Not on file   Years of education: Not on file   Highest education level: Not on file  Occupational History   Not on file  Tobacco Use   Smoking status: Former    Packs/day: 1.50    Years: 40.00    Pack years: 60.00    Types: Cigarettes    Start date: 11/04/1963    Quit date: 10/29/2003    Years since quitting: 18.3   Smokeless tobacco: Never  Vaping Use   Vaping Use: Never used  Substance and Sexual Activity   Alcohol use: Yes    Alcohol/week: 7.0 standard drinks    Types: 7 Standard drinks or equivalent per week    Comment: one miller lite daily    Drug use: No    Sexual activity: Not on file  Other Topics Concern   Not on file  Social History Narrative   Not on file   Social Determinants of Health   Financial Resource Strain: Not on file  Food Insecurity: Not on file  Transportation Needs: Not on file  Physical Activity: Not on file  Stress: Not on file  Social Connections: Not on file  Intimate Partner Violence: Not on file    Family History:    Family History  Problem Relation Age of Onset   Heart disease Mother    Heart attack Mother    Colon polyps Mother    Heart attack Maternal Grandmother    Heart attack Maternal Grandfather    Colon polyps Brother    Colon cancer Neg Hx      ROS:  Please see the history of present illness.   All other ROS reviewed and negative.     Physical Exam/Data:   Vitals:   02/27/22 1113 02/27/22 1115 02/27/22 1403 02/27/22 1404  BP: (!) 163/70  (!) 161/78   Pulse: (!) 55  (!) 51   Resp: 20  18   Temp: 98.2 F (36.8 C)  98.2 F (36.8 C)   TempSrc: Oral  Oral   SpO2: 95%  100%   Weight:  82.1 kg  82.6 kg  Height:    5\' 8"  (1.727 m)   No intake or output data in the 24 hours ending 02/27/22 1647 Last 3 Weights 02/27/2022 02/27/2022 10/21/2021  Weight (lbs) 182 lb 181 lb 181 lb  Weight (kg) 82.555 kg 82.1 kg 82.101 kg     Body mass index is 27.67 kg/m.  General:  Well nourished, well developed, in no acute distress HEENT: normal Neck: no JVD Vascular: No carotid bruits; Distal pulses 2+ bilaterally Cardiac:  normal S1, S2; RRR; no murmur  Lungs:  clear to auscultation bilaterally, no wheezing, rhonchi or rales  Abd: soft, nontender, no hepatomegaly  Ext: no edema Musculoskeletal:  No deformities, BUE and BLE strength normal and equal Skin: warm and dry  Neuro:  CNs 2-12 intact, no focal abnormalities noted Psych:  Normal affect    Laboratory Data:  High Sensitivity Troponin:   Recent Labs  Lab 02/27/22 1218 02/27/22 1455  TROPONINIHS 7 7     Chemistry Recent Labs  Lab  02/27/22 1218  NA 140  K 3.7  CL 105  CO2 25  GLUCOSE 136*  BUN 21  CREATININE  0.87  CALCIUM 9.0  GFRNONAA >60  ANIONGAP 10    No results for input(s): PROT, ALBUMIN, AST, ALT, ALKPHOS, BILITOT in the last 168 hours. Lipids No results for input(s): CHOL, TRIG, HDL, LABVLDL, LDLCALC, CHOLHDL in the last 168 hours.  Hematology Recent Labs  Lab 02/27/22 1218  WBC 6.5  RBC 4.42  HGB 13.7  HCT 40.5  MCV 91.6  MCH 31.0  MCHC 33.8  RDW 13.3  PLT 127*   Thyroid No results for input(s): TSH, FREET4 in the last 168 hours.  BNPNo results for input(s): BNP, PROBNP in the last 168 hours.  DDimer No results for input(s): DDIMER in the last 168 hours.   Radiology/Studies:  CT Angio Head W or Wo Contrast  Result Date: 02/27/2022 CLINICAL DATA:  Neuro deficit, acute, stroke suspected; episode of abnormal vision, history of ocular migraines EXAM: CT ANGIOGRAPHY HEAD AND NECK TECHNIQUE: Multidetector CT imaging of the head and neck was performed using the standard protocol during bolus administration of intravenous contrast. Multiplanar CT image reconstructions and MIPs were obtained to evaluate the vascular anatomy. Carotid stenosis measurements (when applicable) are obtained utilizing NASCET criteria, using the distal internal carotid diameter as the denominator. RADIATION DOSE REDUCTION: This exam was performed according to the departmental dose-optimization program which includes automated exposure control, adjustment of the mA and/or kV according to patient size and/or use of iterative reconstruction technique. CONTRAST:  7mL OMNIPAQUE IOHEXOL 350 MG/ML SOLN COMPARISON:  None. FINDINGS: CT HEAD Brain: There is no acute intracranial hemorrhage, mass effect, or edema. Age-indeterminate small infarct of the right postcentral gyrus. Chronic appearing small vessel infarct of the right caudate. There is no extra-axial fluid collection. Ventricles and sulci are within normal limits in size and  configuration. Vascular: No hyperdense vessel. Skull: Calvarium is unremarkable. Sinuses/Orbits: No acute finding. Other: None. Review of the MIP images confirms the above findings CTA NECK Aortic arch: Mixed plaque along the arch and patent great vessel origins. Right carotid system: Patent. Calcified plaque along the common carotid without stenosis. Primarily calcified plaque along the proximal internal carotid. Stenosis evaluation is limited by streak artifact but there is at least 70% narrowing. Left carotid system: Patent. Mild calcified plaque along the distal common carotid without stenosis. Primarily calcified plaque along the proximal internal carotid. Stenosis evaluation is limited by streak artifact but there is at least 70% stenosis. Vertebral arteries: Patent. Left vertebral is dominant. Mild calcified plaque. No significant stenosis. Skeleton: Cervical spine degenerative changes greatest at C5-C6 and C6-C7. Other neck: Unremarkable. Upper chest: Included lungs are clear. Review of the MIP images confirms the above findings CTA HEAD Anterior circulation: Intracranial internal carotid arteries are patent with calcified plaque causing mild stenosis. Anterior and middle cerebral arteries are patent. Posterior circulation: Intracranial vertebral arteries are patent. Basilar artery is patent. Major cerebellar artery origins are patent. Small bilateral posterior communicating arteries are present. Posterior cerebral arteries are patent. Venous sinuses: Patent as allowed by contrast bolus timing. Review of the MIP images confirms the above findings IMPRESSION: No acute intracranial hemorrhage. Age-indeterminate small infarct of the right postcentral gyrus. 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 No large vessel occlusion. Plaque at the ICA origins causes at least 70% stenosis. Electronically Signed   By: Macy Mis M.D.   On: 02/27/2022 14:47   DG Chest 1 View  Result Date: 02/27/2022 CLINICAL DATA:   Transient vision loss last night. Anterior chest pain after digging a trench. Ex-smoker. EXAM: CHEST  1 VIEW COMPARISON:  06/23/2010  FINDINGS: Poor inspiration. Mild bibasilar atelectasis. Otherwise, clear lungs with normal vascularity. Minimal peribronchial thickening without significant change. Normal sized heart. Aortic arch calcifications. Mild thoracic spine degenerative changes. IMPRESSION: 1. Poor inspiration with mild bibasilar atelectasis. 2. Minimal chronic bronchitic changes. Electronically Signed   By: Claudie Revering M.D.   On: 02/27/2022 11:49   CT Angio Neck W and/or Wo Contrast  Result Date: 02/27/2022 CLINICAL DATA:  Neuro deficit, acute, stroke suspected; episode of abnormal vision, history of ocular migraines EXAM: CT ANGIOGRAPHY HEAD AND NECK TECHNIQUE: Multidetector CT imaging of the head and neck was performed using the standard protocol during bolus administration of intravenous contrast. Multiplanar CT image reconstructions and MIPs were obtained to evaluate the vascular anatomy. Carotid stenosis measurements (when applicable) are obtained utilizing NASCET criteria, using the distal internal carotid diameter as the denominator. RADIATION DOSE REDUCTION: This exam was performed according to the departmental dose-optimization program which includes automated exposure control, adjustment of the mA and/or kV according to patient size and/or use of iterative reconstruction technique. CONTRAST:  26mL OMNIPAQUE IOHEXOL 350 MG/ML SOLN COMPARISON:  None. FINDINGS: CT HEAD Brain: There is no acute intracranial hemorrhage, mass effect, or edema. Age-indeterminate small infarct of the right postcentral gyrus. Chronic appearing small vessel infarct of the right caudate. There is no extra-axial fluid collection. Ventricles and sulci are within normal limits in size and configuration. Vascular: No hyperdense vessel. Skull: Calvarium is unremarkable. Sinuses/Orbits: No acute finding. Other: None. Review of  the MIP images confirms the above findings CTA NECK Aortic arch: Mixed plaque along the arch and patent great vessel origins. Right carotid system: Patent. Calcified plaque along the common carotid without stenosis. Primarily calcified plaque along the proximal internal carotid. Stenosis evaluation is limited by streak artifact but there is at least 70% narrowing. Left carotid system: Patent. Mild calcified plaque along the distal common carotid without stenosis. Primarily calcified plaque along the proximal internal carotid. Stenosis evaluation is limited by streak artifact but there is at least 70% stenosis. Vertebral arteries: Patent. Left vertebral is dominant. Mild calcified plaque. No significant stenosis. Skeleton: Cervical spine degenerative changes greatest at C5-C6 and C6-C7. Other neck: Unremarkable. Upper chest: Included lungs are clear. Review of the MIP images confirms the above findings CTA HEAD Anterior circulation: Intracranial internal carotid arteries are patent with calcified plaque causing mild stenosis. Anterior and middle cerebral arteries are patent. Posterior circulation: Intracranial vertebral arteries are patent. Basilar artery is patent. Major cerebellar artery origins are patent. Small bilateral posterior communicating arteries are present. Posterior cerebral arteries are patent. Venous sinuses: Patent as allowed by contrast bolus timing. Review of the MIP images confirms the above findings IMPRESSION: No acute intracranial hemorrhage. Age-indeterminate small infarct of the right postcentral gyrus. 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 No large vessel occlusion. Plaque at the ICA origins causes at least 70% stenosis. Electronically Signed   By: Macy Mis M.D.   On: 02/27/2022 14:47   MR ORBITS W WO CONTRAST  Result Date: 02/27/2022 CLINICAL DATA:  Vision loss, monocular. Additional history provided: Right eye vision loss for 1 day. EXAM: MRI OF THE ORBITS WITHOUT AND WITH  CONTRAST TECHNIQUE: Multiplanar, multi-echo pulse sequences of the orbits and surrounding structures were acquired including fat saturation techniques, before and after intravenous contrast administration. CONTRAST:  30mL GADAVIST GADOBUTROL 1 MMOL/ML IV SOLN COMPARISON:  CT angiogram head/neck 02/27/2022. FINDINGS: The examination is significantly motion degraded, limiting evaluation. Most notably, the axial and coronal T2-weighted sequences are moderate to severely motion degraded,  the coronal T1-weighted sequence is moderate to severely motion degraded, the axial T1-weighted post-contrast fat saturated sequence is moderate to severely motion degraded and the coronal T1-weighted post-contrast fat saturated sequence is moderate to severely motion degraded. Orbits: Within described limitations, the globes appear normal in size and contour. No definite abnormality of the optic nerve sheath complexes, extraocular muscles or lack mole glands is identified. No intraorbital mass or abnormal intraorbital enhancement is identified. Visualized sinuses: No appreciable significant paranasal sinus disease. Soft tissues: No appreciable soft tissue abnormality within the face or upper neck. Limited intracranial: Whole brain diffusion-weighted imaging is of good quality and reveals no evidence of acute infarct. IMPRESSION: Significantly motion degraded MR imaging of the orbits (with the majority of sequences moderate to severely motion degraded). Within this significant limitation, no definite acute abnormality is identified within the orbits. Whole brain diffusion-weighted imaging is of good quality and reveals no evidence of acute infarct. Electronically Signed   By: Kellie Simmering D.O.   On: 02/27/2022 16:29     Assessment and Plan:   1.Chest pain - no objective evidence of ischemia by EKG or enzymes, there is a delta of 0 on repeat HS trops.  - 2009 cath LM normal, LAD prox 20%, D3 70%, LCX 20%, OM1 70%, RCA 50% prox,   - 04/2021 stress test within the last year without ischemia - in absence of objective evidence of ischemia would not plan for repeat ischemic testing - home regiman norvasc 5mg  daily, asa 81, atorva 20, metoprol 25mg  bid, telmisartan 80. Increase norvasc to 10mg  for additional antianginal effects  -ok for ER discharge.    2. Vision changes - per ER     For questions or updates, please contact [facility] Please consult [email or web address] for contact info under    Signed, Carlyle Dolly, MD  02/27/2022 4:47 PM

## 2022-02-27 NOTE — ED Provider Notes (Signed)
[facility] EMERGENCY DEPARTMENT Provider Note   CSN: [id number] Arrival date & time: 02/27/22  1033     History  Chief Complaint  Patient presents with   Eye Problem    Christopher Randolph is a 76 y.o. male.   Eye Problem Associated symptoms: no weakness   Patient presents with vision change.  States last night found to have already decreased vision in his right eye.  Started with some stroking lights and some color changes.  Then eventually whole vision went away.  Came back after about half an hour and taking some aspirin.  States felt a little off.  No headache.  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  States he has some narrowing in his carotids. Also with chest pain.  Was dull chest pain yesterday.  Nagging.  States he was taking drainages.  States he cannot do the same activity used to.  States he has to limit his activity due to fatigue.  States does not normally have pain.  States had more pain last night but also was resolved.  Has had previous coronary artery disease and has had previous stress test.    Home Medications Prior to Admission medications   Medication Sig Start Date End Date Taking? Authorizing Provider  amLODipine (NORVASC) 5 MG tablet Take 5 mg by mouth daily. 05/26/20  Yes [provider]  aspirin EC 81 MG tablet Take 81 mg by mouth daily.     Yes [provider]  atorvastatin (LIPITOR) 20 MG tablet TAKE 1 TABLET BY MOUTH DAILY Patient taking differently: Take 20 mg by mouth daily. 12/03/21  Yes Satira Sark, MD  Bromfenac Sodium (PROLENSA) 0.07 % SOLN Apply 1 drop to eye in the morning, at noon, in the evening, and at bedtime. 12/12/21  Yes [name], MD  cyclobenzaprine (FLEXERIL) 10 MG tablet Take 10 mg by mouth every 8 (eight) hours as needed for muscle spasms. 09/24/20  Yes [provider]  fluticasone (FLONASE) 50 MCG/ACT nasal spray Place 2 sprays into both nostrils daily.   Yes [provider]  latanoprost (XALATAN) 0.005 % ophthalmic solution Place 1 drop into the left eye at bedtime. 02/11/22  Yes [provider]  levothyroxine (SYNTHROID, LEVOTHROID) 150 MCG tablet Take 150 mcg by mouth See admin instructions. Alternates taking 150 mg daily for 2 days then 175 mcg the third day then repeat 11/01/18  Yes [provider]  levothyroxine (SYNTHROID, LEVOTHROID) 175 MCG tablet Take 175 mcg by mouth See admin instructions. Alternates taking 150 mg daily for 2 days then 175 mcg the third day then repeat   Yes [provider]  metFORMIN (GLUCOPHAGE) 500 MG tablet Take 500 mg by mouth daily. 10/19/14  Yes [provider]  metoprolol tartrate (LOPRESSOR) 25 MG tablet Take 25 mg by mouth 2 (two) times daily.    Yes [provider]  MITIGARE 0.6 MG CAPS Take 1 capsule by mouth as needed (gout attacks). 05/13/20  Yes [provider]  mometasone (ELOCON) 0.1 % ointment Apply 1 application topically daily.   Yes [provider]  naproxen sodium (ANAPROX) 220 MG tablet Take 220 mg by mouth daily as needed (for pain.).    Yes [provider]  prednisoLONE acetate (PRED FORTE) 1 % ophthalmic suspension Place 1 drop into the left eye daily. 02/11/22  Yes [provider]  predniSONE (STERAPRED UNI-PAK 21 TAB) 10 MG (21) TBPK tablet Take 10 mg  by mouth as directed. 02/24/22  Yes [provider]  RESTASIS 0.05 % ophthalmic emulsion Place 1 drop into both eyes 2 (two) times daily. 02/11/22  Yes [provider]  tamsulosin (FLOMAX) 0.4 MG CAPS capsule Take 0.8 mg by mouth daily.   Yes [provider]  telmisartan (MICARDIS) 80 MG tablet TAKE 1 TABLET BY MOUTH EVERY DAY Patient taking differently: Take 80 mg by mouth daily. 09/08/21  Yes Satira Sark, MD  Loteprednol Etabonate (LOTEMAX SM) 0.38 % GEL Place 1 drop into the left eye 4 (four) times daily. Patient not taking: Reported on  02/27/2022 08/28/20   [name], MD  nitroGLYCERIN (NITROSTAT) 0.4 MG SL tablet DISSOLVE 1 TABLET UNDER THE TONGUE EVERY 5 MINUTES AS NEEDED FOR CHEST PAIN. DO NOT EXCEED A TOTAL OF 3 DOSES IN 15 MINUTES. Patient taking differently: Place 0.4 mg under the tongue every 5 (five) minutes as needed for chest pain. 04/18/21   Satira Sark, MD      Allergies    Erythromycin, Shellfish allergy, and Azithromycin    Review of Systems   Review of Systems  Constitutional:  Negative for appetite change.  HENT:  Negative for congestion.   Eyes:  Positive for visual disturbance.  Respiratory:  Positive for shortness of breath.   Cardiovascular:  Positive for chest pain.  Gastrointestinal:  Negative for abdominal pain.  Genitourinary:  Negative for flank pain.  Skin:  Negative for rash.  Neurological:  Negative for weakness.   Physical Exam Updated Vital Signs BP (!) 161/78 (BP Location: Right Arm)    Pulse (!) 51    Temp 98.2 F (36.8 C) (Oral)    Resp 18    Ht 5\' 8"  (1.727 m)    Wt 82.6 kg    SpO2 100%    BMI 27.67 kg/m  Physical Exam Vitals and nursing note reviewed.  Eyes:     Pupils: Pupils are equal, round, and reactive to light.  Pulmonary:     Breath sounds: No wheezing or rhonchi.  Abdominal:     Tenderness: There is no abdominal tenderness.  Musculoskeletal:        General: No tenderness.  Skin:    General: Skin is warm.     Capillary Refill: Capillary refill takes less than 2 seconds.  Neurological:     Mental Status: He is alert.    ED Results / Procedures / Treatments   Labs (all labs ordered are listed, but only abnormal results are displayed) Labs Reviewed  BASIC METABOLIC PANEL - Abnormal; Notable for the following components:      Result Value   Glucose, Bld 136 (*)    All other components within normal limits  CBC - Abnormal; Notable for the following components:   Platelets 127 (*)    All other components within normal limits  TROPONIN I (HIGH  SENSITIVITY)  TROPONIN I (HIGH SENSITIVITY)    EKG EKG Interpretation  Date/Time:  Friday February 27 2022 11:48:32 EST Ventricular Rate:  54 PR Interval:  182 QRS Duration: 86 QT Interval:  440 QTC Calculation: 417 R Axis:   40 Text Interpretation: Sinus bradycardia Nonspecific T wave abnormality Abnormal ECG When compared with ECG of 11-Jan-2019 09:38, No significant change was found Confirmed by Davonna Belling [phone]) on 02/27/2022 11:55:42 AM  Radiology CT Angio Head W or Wo Contrast  Result Date: 02/27/2022 CLINICAL DATA:  Neuro deficit, acute, stroke suspected; episode of abnormal vision, history of ocular migraines EXAM: CT  ANGIOGRAPHY HEAD AND NECK TECHNIQUE: Multidetector CT imaging of the head and neck was performed using the standard protocol during bolus administration of intravenous contrast. Multiplanar CT image reconstructions and MIPs were obtained to evaluate the vascular anatomy. Carotid stenosis measurements (when applicable) are obtained utilizing NASCET criteria, using the distal internal carotid diameter as the denominator. RADIATION DOSE REDUCTION: This exam was performed according to the departmental dose-optimization program which includes automated exposure control, adjustment of the mA and/or kV according to patient size and/or use of iterative reconstruction technique. CONTRAST:  44mL OMNIPAQUE IOHEXOL 350 MG/ML SOLN COMPARISON:  None. FINDINGS: CT HEAD Brain: There is no acute intracranial hemorrhage, mass effect, or edema. Age-indeterminate small infarct of the right postcentral gyrus. Chronic appearing small vessel infarct of the right caudate. There is no extra-axial fluid collection. Ventricles and sulci are within normal limits in size and configuration. Vascular: No hyperdense vessel. Skull: Calvarium is unremarkable. Sinuses/Orbits: No acute finding. Other: None. Review of the MIP images confirms the above findings CTA NECK Aortic arch: Mixed plaque along the arch  and patent great vessel origins. Right carotid system: Patent. Calcified plaque along the common carotid without stenosis. Primarily calcified plaque along the proximal internal carotid. Stenosis evaluation is limited by streak artifact but there is at least 70% narrowing. Left carotid system: Patent. Mild calcified plaque along the distal common carotid without stenosis. Primarily calcified plaque along the proximal internal carotid. Stenosis evaluation is limited by streak artifact but there is at least 70% stenosis. Vertebral arteries: Patent. Left vertebral is dominant. Mild calcified plaque. No significant stenosis. Skeleton: Cervical spine degenerative changes greatest at C5-C6 and C6-C7. Other neck: Unremarkable. Upper chest: Included lungs are clear. Review of the MIP images confirms the above findings CTA HEAD Anterior circulation: Intracranial internal carotid arteries are patent with calcified plaque causing mild stenosis. Anterior and middle cerebral arteries are patent. Posterior circulation: Intracranial vertebral arteries are patent. Basilar artery is patent. Major cerebellar artery origins are patent. Small bilateral posterior communicating arteries are present. Posterior cerebral arteries are patent. Venous sinuses: Patent as allowed by contrast bolus timing. Review of the MIP images confirms the above findings IMPRESSION: No acute intracranial hemorrhage. Age-indeterminate small infarct of the right postcentral gyrus. 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 No large vessel occlusion. Plaque at the ICA origins causes at least 70% stenosis. Electronically Signed   By: Macy Mis M.D.   On: 02/27/2022 14:47   DG Chest 1 View  Result Date: 02/27/2022 CLINICAL DATA:  Transient vision loss last night. Anterior chest pain after digging a trench. Ex-smoker. EXAM: CHEST  1 VIEW COMPARISON:  06/23/2010 FINDINGS: Poor inspiration. Mild bibasilar atelectasis. Otherwise, clear lungs with normal  vascularity. Minimal peribronchial thickening without significant change. Normal sized heart. Aortic arch calcifications. Mild thoracic spine degenerative changes. IMPRESSION: 1. Poor inspiration with mild bibasilar atelectasis. 2. Minimal chronic bronchitic changes. Electronically Signed   By: Claudie Revering M.D.   On: 02/27/2022 11:49   CT Angio Neck W and/or Wo Contrast  Result Date: 02/27/2022 CLINICAL DATA:  Neuro deficit, acute, stroke suspected; episode of abnormal vision, history of ocular migraines EXAM: CT ANGIOGRAPHY HEAD AND NECK TECHNIQUE: Multidetector CT imaging of the head and neck was performed using the standard protocol during bolus administration of intravenous contrast. Multiplanar CT image reconstructions and MIPs were obtained to evaluate the vascular anatomy. Carotid stenosis measurements (when applicable) are obtained utilizing NASCET criteria, using the distal internal carotid diameter as the denominator. RADIATION DOSE REDUCTION: This exam was  performed according to the departmental dose-optimization program which includes automated exposure control, adjustment of the mA and/or kV according to patient size and/or use of iterative reconstruction technique. CONTRAST:  29mL OMNIPAQUE IOHEXOL 350 MG/ML SOLN COMPARISON:  None. FINDINGS: CT HEAD Brain: There is no acute intracranial hemorrhage, mass effect, or edema. Age-indeterminate small infarct of the right postcentral gyrus. Chronic appearing small vessel infarct of the right caudate. There is no extra-axial fluid collection. Ventricles and sulci are within normal limits in size and configuration. Vascular: No hyperdense vessel. Skull: Calvarium is unremarkable. Sinuses/Orbits: No acute finding. Other: None. Review of the MIP images confirms the above findings CTA NECK Aortic arch: Mixed plaque along the arch and patent great vessel origins. Right carotid system: Patent. Calcified plaque along the common carotid without stenosis. Primarily  calcified plaque along the proximal internal carotid. Stenosis evaluation is limited by streak artifact but there is at least 70% narrowing. Left carotid system: Patent. Mild calcified plaque along the distal common carotid without stenosis. Primarily calcified plaque along the proximal internal carotid. Stenosis evaluation is limited by streak artifact but there is at least 70% stenosis. Vertebral arteries: Patent. Left vertebral is dominant. Mild calcified plaque. No significant stenosis. Skeleton: Cervical spine degenerative changes greatest at C5-C6 and C6-C7. Other neck: Unremarkable. Upper chest: Included lungs are clear. Review of the MIP images confirms the above findings CTA HEAD Anterior circulation: Intracranial internal carotid arteries are patent with calcified plaque causing mild stenosis. Anterior and middle cerebral arteries are patent. Posterior circulation: Intracranial vertebral arteries are patent. Basilar artery is patent. Major cerebellar artery origins are patent. Small bilateral posterior communicating arteries are present. Posterior cerebral arteries are patent. Venous sinuses: Patent as allowed by contrast bolus timing. Review of the MIP images confirms the above findings IMPRESSION: No acute intracranial hemorrhage. Age-indeterminate small infarct of the right postcentral gyrus. 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 No large vessel occlusion. Plaque at the ICA origins causes at least 70% stenosis. Electronically Signed   By: Macy Mis M.D.   On: 02/27/2022 14:47    Procedures Procedures    Medications Ordered in ED Medications  iohexol (OMNIPAQUE) 350 MG/ML injection 75 mL (75 mLs Intravenous Contrast Given 02/27/22 1415)    ED Course/ Medical Decision Making/ A&P                           Medical Decision Making Amount and/or Complexity of Data Reviewed External Data Reviewed: notes.    Details: Cardiology note Labs: ordered. Radiology: ordered and independent  interpretation performed. Decision-making details documented in ED Course.    Details: CT reviewed.  No bleed.  Risk Prescription drug management.   Patient presents with transient monocular vision loss.  Happened yesterday.  Did have some vision changes with it that led up to it.  History of ocular migraines but states felt a little different.  Lasted about half hour.  CT angiography done and reassuring.  No clear cause of vision loss.  MRI pending.  Also has had some chest pain.  Dull.  Potentially has been worse with exertion.  Previous cardiac history.  Discussed with Dr. Harl Bowie, who will see patient. If MRI reassuring can likely can be followed up as outpatient for the vision loss.  ABCD 2 score of 3 which would make him low risk.  Potentially even could be ocular migraine, which he has had previously. EKG reassuring and troponin negative x1.  Second 1 pending.  Cardiology  to see patient and hopefully discharge home.  Had a stress test a year ago.  Care turned over to Dr. Eulis Foster.         Final Clinical Impression(s) / ED Diagnoses Final diagnoses:  Amaurosis fugax of right eye    Rx / DC Orders ED Discharge Orders     None         Davonna Belling, MD 02/27/22 [phone]

## 2022-02-27 NOTE — Progress Notes (Addendum)
Neurology Telephone Note ? ?Neurology was consulted by Dr. Eulis Foster [facility] ED[facility] regarding this patient who presented with CP (already evaluated by cardiology) and mentioned an episode of transient neurologic sx the evening prior to ED presentation. 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 The event consisted initially of unilateral scintillating scotoma but then progressed to complete loss of vision in one eye which resolved after 5 minutes c/f TIA. ? ?CTH ?CTA H&N in ED ? ?No acute intracranial hemorrhage. Age-indeterminate small infarct of ?the right postcentral gyrus. 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 ?  ?No large vessel occlusion. Plaque at the ICA origins causes at least ?70% stenosis. ? ?MRI orbits wwo contrast - orbit sequences are highly degraded 2/2 motion but whole brain diffusion sequences are sufficient to rule out acute ischemic stroke ? ?CNS imaging personally reviewed ? ?Recommendations: ?- Admit to hospital service for TIA workup. Patient is suitable to stay at [facility]. ?- Continue ASA 81mg  daily ?- Goal normotension, avoid hypotension ?- MRI brain wo contrast does not need to be performed, as whole brain diffusion sequences for MRI orbits wwo obtained in ED are sufficient to rule out acute ischemic stroke. MRI orbits motion degraded but does not need to be repeated for TIA workup ?- TTE w/ bubble ?- Check A1c and LDL + add statin per guidelines ?- q4 hr neuro checks ?- STAT head CT for any change in neuro exam ?- Tele ?- PT/OT/SLP ?- Stroke education ?- Please place ambulatory referral to neurology for outpatient f/u at time of discharge ? ?Su Monks, MD ?[facility] ?[phone] ? ?If 7pm- 7am, please page neurology on call as listed in [place]. ? ?

## 2022-02-27 NOTE — H&P (Signed)
TRH H&P   Patient Demographics:    Christopher Randolph, is a 76 y.o. male  MRN: [id number]   DOB - 1946/03/30  Admit Date - 02/27/2022  Outpatient Primary MD for the patient is Burdine, Virgina Evener, MD  Referring MD/NP/PA: Lawanda Cousins  Patient coming from: Home  Chief Complaint  Patient presents with   Eye Problem      HPI:    Christopher Randolph  is a 76 y.o. 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, but this was only in the left eye, now is back to normal, as well he did report chest discomfort in mid chest area 1-2 out of 10 intensity, occurs at rest or with activity, sometimes associated with dyspnea, patient report similar symptoms last year when he had a nuclear stress test which showed no significant ischemia -In ED work-up significant for potassium of 3.7, creatinine of 0.87, BUN of 21, hemoglobin of 13.7, troponins negative at 7 >7, chest x-ray with no acute findings, EKG showing sinus bradycardia with no ischemic changes, MRI orbits with no acute finding and orbits, as well with no evidence of acute infarcts, patient was seen by teleneurology and cardiology, [facility] hospitalist consulted to admit.    Review of systems:    In addition to the HPI above,  A full 10 point Review of Systems was done, except as stated above, all other Review of Systems were negative.   With Past History of the following :    Past Medical History:  Diagnosis Date    Arthritis    CAD (coronary artery disease)    Branch vessel and moderate RCA disease 2006   Cancer ([place]) 1990   Melanoma Lower right Leg   Carotid artery disease ([place])    Essential hypertension    Glaucoma    Hyperlipidemia    Hypothyroidism    Lyme disease    PAD (peripheral artery disease) ([place])    Left common iliac stent 2004   Pneumonia    Type 2 diabetes mellitus ([place])    Wears dentures    Wears glasses       Past Surgical History:  Procedure Laterality Date   CATARACT EXTRACTION Bilateral    CATARACT EXTRACTION, BILATERAL     CERVICAL DISC SURGERY     x 1   COLONOSCOPY N/A 11/06/2016   Procedure: COLONOSCOPY;  Surgeon: Daneil Dolin, MD;  [facility];  Service: Endoscopy;  Laterality: N/A;  7:30 AM   COLONOSCOPY  2012   Dr. Anthony Sar: normal. reviewed reports, which states he has a history of polyps in remote past.    EYE SURGERY Bilateral    Cat Sx   EYE SURGERY Left 01/14/2021   [place] - Dr. Jovita Kussmaul   IRIDOTOMY / IRIDECTOMY Left 01/14/2021   [place] - Dr. Jovita Kussmaul   JOINT REPLACEMENT     LUMBAR WOUND DEBRIDEMENT N/A 01/24/2019   Procedure: LUMBAR WOUND Exploration for Evacation of Seroma vs. Hematoma;  Surgeon: Jovita Gamma, MD;  Location: [place];  Service: Neurosurgery;  Laterality: N/A;   MELANOMA EXCISION     right leg, [facility] at [facility] and underwent immunotherapy   NECK SURGERY      x 1 yrs ago   PARS PLANA VITRECTOMY Left 04/04/2020   Procedure: PARS PLANA VITRECTOMY WITH 25G REMOVAL/SUTURE INTRAOCULAR LENS;  Surgeon: [name], MD;  Location: [place];  Service: Ophthalmology;  Laterality: Left;   REPLACEMENT TOTAL KNEE Right    WISDOM TOOTH EXTRACTION        Social History:     Social History   Tobacco Use   Smoking status: Former    Packs/day: 1.50    Years: 40.00    Pack years: 60.00    Types: Cigarettes    Start date: 11/04/1963    Quit date: 10/29/2003    Years since quitting: 18.3   Smokeless tobacco: Never  Substance Use  Topics   Alcohol use: Yes    Alcohol/week: 7.0 standard drinks    Types: 7 Standard drinks or equivalent per week    Comment: one miller lite daily        Family History :     Family History  Problem Relation Age of Onset   Heart disease Mother    Heart attack Mother    Colon polyps Mother    Heart attack Maternal Grandmother    Heart attack Maternal Grandfather    Colon polyps Brother    Colon cancer Neg Hx      Home Medications:   Prior to Admission medications   Medication Sig Start Date End Date Taking? Authorizing Provider  amLODipine (NORVASC) 5 MG tablet Take 5 mg by mouth daily. 05/26/20  Yes [provider]  aspirin EC 81 MG tablet Take 81 mg by mouth daily.     Yes [provider]  atorvastatin (LIPITOR) 20 MG tablet TAKE 1 TABLET BY MOUTH DAILY Patient taking differently: Take 20 mg by mouth daily. 12/03/21  Yes Satira Sark, MD  Bromfenac Sodium (PROLENSA) 0.07 % SOLN Apply 1 drop to eye in the morning, at noon, in the evening, and at bedtime. 12/12/21  Yes [name], MD  cyclobenzaprine (FLEXERIL) 10 MG tablet Take 10 mg by mouth every 8 (eight) hours as needed for muscle spasms. 09/24/20  Yes [provider]  fluticasone (FLONASE) 50 MCG/ACT nasal spray Place 2 sprays into both nostrils daily.   Yes [provider]  latanoprost (XALATAN) 0.005 % ophthalmic solution Place 1 drop into the left eye at bedtime. 02/11/22  Yes [provider]  levothyroxine (SYNTHROID, LEVOTHROID) 150 MCG tablet Take 150 mcg by mouth See admin instructions. Alternates taking 150 mg daily for 2 days then 175 mcg the third day then repeat 11/01/18  Yes [provider]  levothyroxine (SYNTHROID, LEVOTHROID) 175 MCG tablet Take 175 mcg by mouth See admin instructions. Alternates taking 150 mg daily for 2 days then 175 mcg the third day then repeat   Yes  [provider]  metFORMIN (GLUCOPHAGE) 500 MG tablet Take 500 mg by mouth  daily. 10/19/14  Yes [provider]  metoprolol tartrate (LOPRESSOR) 25 MG tablet Take 25 mg by mouth 2 (two) times daily.    Yes [provider]  MITIGARE 0.6 MG CAPS Take 1 capsule by mouth as needed (gout attacks). 05/13/20  Yes [provider]  mometasone (ELOCON) 0.1 % ointment Apply 1 application topically daily.   Yes [provider]  naproxen sodium (ANAPROX) 220 MG tablet Take 220 mg by mouth daily as needed (for pain.).    Yes [provider]  prednisoLONE acetate (PRED FORTE) 1 % ophthalmic suspension Place 1 drop into the left eye daily. 02/11/22  Yes [provider]  predniSONE (STERAPRED UNI-PAK 21 TAB) 10 MG (21) TBPK tablet Take 10 mg by mouth as directed. 02/24/22  Yes [provider]  RESTASIS 0.05 % ophthalmic emulsion Place 1 drop into both eyes 2 (two) times daily. 02/11/22  Yes [provider]  tamsulosin (FLOMAX) 0.4 MG CAPS capsule Take 0.8 mg by mouth daily.   Yes [provider]  telmisartan (MICARDIS) 80 MG tablet TAKE 1 TABLET BY MOUTH EVERY DAY Patient taking differently: Take 80 mg by mouth daily. 09/08/21  Yes Satira Sark, MD  Loteprednol Etabonate (LOTEMAX SM) 0.38 % GEL Place 1 drop into the left eye 4 (four) times daily. Patient not taking: Reported on 02/27/2022 08/28/20   [name], MD  nitroGLYCERIN (NITROSTAT) 0.4 MG SL tablet DISSOLVE 1 TABLET UNDER THE TONGUE EVERY 5 MINUTES AS NEEDED FOR CHEST PAIN. DO NOT EXCEED A TOTAL OF 3 DOSES IN 15 MINUTES. Patient taking differently: Place 0.4 mg under the tongue every 5 (five) minutes as needed for chest pain. 04/18/21   Satira Sark, MD     Allergies:     Allergies  Allergen Reactions   Erythromycin Swelling    swelling of lips swelling of lips swelling of lips swelling of lips    Shellfish Allergy Other (See Comments) and Rash    Gout flares Gout flares Gout flares Gout flares   Azithromycin Swelling      Physical Exam:   Vitals  Blood pressure (!) 164/71, pulse (!) 47, temperature 97.7 F (36.5 C), temperature source Oral, resp. rate 18, height 5\' 8"  (1.727 m), weight 82.6 kg, SpO2 98 %.   1. General developed male, laying in bed, lying in bed in NAD,   2. Normal affect and insight, Not Suicidal or Homicidal, Awake Alert, Oriented X 3.  3. No F.N deficits, ALL C.Nerves Intact, Strength 5/5 all 4 extremities, Sensation intact all 4 extremities, Plantars down going.  4. Ears and Eyes appear Normal, Conjunctivae clear, PERRLA. Moist Oral Mucosa.  5. Supple Neck, No JVD, No cervical lymphadenopathy appriciated, No Carotid Bruits.  6. Symmetrical Chest wall movement, Good air movement bilaterally, CTAB.  7. RRR, No Gallops, Rubs or Murmurs, No Parasternal Heave.  8. Positive Bowel Sounds, Abdomen Soft, No tenderness, No organomegaly appriciated,No rebound -guarding or rigidity.  9.  No Cyanosis, Normal Skin Turgor, No Skin Rash or Bruise.  10. Good muscle tone,  joints appear normal , no effusions, Normal ROM.  11. No Palpable Lymph Nodes in Neck or Axillae    Data Review:    CBC Recent Labs  Lab 02/27/22 1218  WBC 6.5  HGB 13.7  HCT 40.5  PLT 127*  MCV 91.6  MCH 31.0  MCHC 33.8  RDW 13.3   ------------------------------------------------------------------------------------------------------------------  Chemistries  Recent Labs  Lab 02/27/22 1218  NA 140  K 3.7  CL 105  CO2 25  GLUCOSE 136*  BUN 21  CREATININE 0.87  CALCIUM 9.0   ------------------------------------------------------------------------------------------------------------------ estimated creatinine clearance is 79.3 mL/min (by C-G formula based on SCr of 0.87 mg/dL). ------------------------------------------------------------------------------------------------------------------ No results for input(s): TSH, T4TOTAL, T3FREE, THYROIDAB in the last 72 hours.  Invalid input(s):  FREET3  Coagulation profile No results for input(s): INR, PROTIME in the last 168 hours. ------------------------------------------------------------------------------------------------------------------- No results for input(s): DDIMER in the last 72 hours. -------------------------------------------------------------------------------------------------------------------  Cardiac Enzymes No results for input(s): CKMB, TROPONINI, MYOGLOBIN in the last 168 hours.  Invalid input(s): CK ------------------------------------------------------------------------------------------------------------------ No results found for: BNP   ---------------------------------------------------------------------------------------------------------------  Urinalysis    Component Value Date/Time   COLORURINE YELLOW 01/24/2019 0907   APPEARANCEUR HAZY (A) 01/24/2019 0907   LABSPEC 1.018 01/24/2019 0907   PHURINE 5.0 01/24/2019 0907   GLUCOSEU NEGATIVE 01/24/2019 0907   HGBUR MODERATE (A) 01/24/2019 0907   BILIRUBINUR NEGATIVE 01/24/2019 0907   KETONESUR NEGATIVE 01/24/2019 0907   PROTEINUR NEGATIVE 01/24/2019 0907   UROBILINOGEN 1.0 06/23/2010 0752   NITRITE NEGATIVE 01/24/2019 0907   LEUKOCYTESUR MODERATE (A) 01/24/2019 0907    ----------------------------------------------------------------------------------------------------------------   Imaging Results:    CT Angio Head W or Wo Contrast  Result Date: 02/27/2022 CLINICAL DATA:  Neuro deficit, acute, stroke suspected; episode of abnormal vision, history of ocular migraines EXAM: CT ANGIOGRAPHY HEAD AND NECK TECHNIQUE: Multidetector CT imaging of the head and neck was performed using the standard protocol during bolus administration of intravenous contrast. Multiplanar CT image reconstructions and MIPs were obtained to evaluate the vascular anatomy. Carotid stenosis measurements (when applicable) are obtained utilizing NASCET criteria, using the  distal internal carotid diameter as the denominator. RADIATION DOSE REDUCTION: This exam was performed according to the departmental dose-optimization program which includes automated exposure control, adjustment of the mA and/or kV according to patient size and/or use of iterative reconstruction technique. CONTRAST:  1mL OMNIPAQUE IOHEXOL 350 MG/ML SOLN COMPARISON:  None. FINDINGS: CT HEAD Brain: There is no acute intracranial hemorrhage, mass effect, or edema. Age-indeterminate small infarct of the right postcentral gyrus. Chronic appearing small vessel infarct of the right caudate. There is no extra-axial fluid collection. Ventricles and sulci are within normal limits in size and configuration. Vascular: No hyperdense vessel. Skull: Calvarium is unremarkable. Sinuses/Orbits: No acute finding. Other: None. Review of the MIP images confirms the above findings CTA NECK Aortic arch: Mixed plaque along the arch and patent great vessel origins. Right carotid system: Patent. Calcified plaque along the common carotid without stenosis. Primarily calcified plaque along the proximal internal carotid. Stenosis evaluation is limited by streak artifact but there is at least 70% narrowing. Left carotid system: Patent. Mild calcified plaque along the distal common carotid without stenosis. Primarily calcified plaque along the proximal internal carotid. Stenosis evaluation is limited by streak artifact but there is at least 70% stenosis. Vertebral arteries: Patent. Left vertebral is dominant. Mild calcified plaque. No significant stenosis. Skeleton: Cervical spine degenerative changes greatest at C5-C6 and C6-C7. Other neck: Unremarkable. Upper chest: Included lungs are clear. Review of the MIP images confirms the above findings CTA HEAD Anterior circulation: Intracranial internal carotid arteries are patent with calcified plaque causing mild stenosis. Anterior and middle cerebral arteries are patent. Posterior circulation:  Intracranial vertebral arteries are patent. Basilar artery is patent. Major cerebellar artery origins are patent. Small bilateral posterior communicating arteries are present. Posterior cerebral arteries are patent. Venous sinuses:  Patent as allowed by contrast bolus timing. Review of the MIP images confirms the above findings IMPRESSION: No acute intracranial hemorrhage. Age-indeterminate small infarct of the right postcentral gyrus. 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 No large vessel occlusion. Plaque at the ICA origins causes at least 70% stenosis. Electronically Signed   By: Macy Mis M.D.   On: 02/27/2022 14:47   DG Chest 1 View  Result Date: 02/27/2022 CLINICAL DATA:  Transient vision loss last night. Anterior chest pain after digging a trench. Ex-smoker. EXAM: CHEST  1 VIEW COMPARISON:  06/23/2010 FINDINGS: Poor inspiration. Mild bibasilar atelectasis. Otherwise, clear lungs with normal vascularity. Minimal peribronchial thickening without significant change. Normal sized heart. Aortic arch calcifications. Mild thoracic spine degenerative changes. IMPRESSION: 1. Poor inspiration with mild bibasilar atelectasis. 2. Minimal chronic bronchitic changes. Electronically Signed   By: Claudie Revering M.D.   On: 02/27/2022 11:49   CT Angio Neck W and/or Wo Contrast  Result Date: 02/27/2022 CLINICAL DATA:  Neuro deficit, acute, stroke suspected; episode of abnormal vision, history of ocular migraines EXAM: CT ANGIOGRAPHY HEAD AND NECK TECHNIQUE: Multidetector CT imaging of the head and neck was performed using the standard protocol during bolus administration of intravenous contrast. Multiplanar CT image reconstructions and MIPs were obtained to evaluate the vascular anatomy. Carotid stenosis measurements (when applicable) are obtained utilizing NASCET criteria, using the distal internal carotid diameter as the denominator. RADIATION DOSE REDUCTION: This exam was performed according to the departmental  dose-optimization program which includes automated exposure control, adjustment of the mA and/or kV according to patient size and/or use of iterative reconstruction technique. CONTRAST:  50mL OMNIPAQUE IOHEXOL 350 MG/ML SOLN COMPARISON:  None. FINDINGS: CT HEAD Brain: There is no acute intracranial hemorrhage, mass effect, or edema. Age-indeterminate small infarct of the right postcentral gyrus. Chronic appearing small vessel infarct of the right caudate. There is no extra-axial fluid collection. Ventricles and sulci are within normal limits in size and configuration. Vascular: No hyperdense vessel. Skull: Calvarium is unremarkable. Sinuses/Orbits: No acute finding. Other: None. Review of the MIP images confirms the above findings CTA NECK Aortic arch: Mixed plaque along the arch and patent great vessel origins. Right carotid system: Patent. Calcified plaque along the common carotid without stenosis. Primarily calcified plaque along the proximal internal carotid. Stenosis evaluation is limited by streak artifact but there is at least 70% narrowing. Left carotid system: Patent. Mild calcified plaque along the distal common carotid without stenosis. Primarily calcified plaque along the proximal internal carotid. Stenosis evaluation is limited by streak artifact but there is at least 70% stenosis. Vertebral arteries: Patent. Left vertebral is dominant. Mild calcified plaque. No significant stenosis. Skeleton: Cervical spine degenerative changes greatest at C5-C6 and C6-C7. Other neck: Unremarkable. Upper chest: Included lungs are clear. Review of the MIP images confirms the above findings CTA HEAD Anterior circulation: Intracranial internal carotid arteries are patent with calcified plaque causing mild stenosis. Anterior and middle cerebral arteries are patent. Posterior circulation: Intracranial vertebral arteries are patent. Basilar artery is patent. Major cerebellar artery origins are patent. Small bilateral  posterior communicating arteries are present. Posterior cerebral arteries are patent. Venous sinuses: Patent as allowed by contrast bolus timing. Review of the MIP images confirms the above findings IMPRESSION: No acute intracranial hemorrhage. Age-indeterminate small infarct of the right postcentral gyrus. 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 No large vessel occlusion. Plaque at the ICA origins causes at least 70% stenosis. Electronically Signed   By: Macy Mis M.D.   On: 02/27/2022 14:47  MR ORBITS W WO CONTRAST  Result Date: 02/27/2022 CLINICAL DATA:  Vision loss, monocular. Additional history provided: Right eye vision loss for 1 day. EXAM: MRI OF THE ORBITS WITHOUT AND WITH CONTRAST TECHNIQUE: Multiplanar, multi-echo pulse sequences of the orbits and surrounding structures were acquired including fat saturation techniques, before and after intravenous contrast administration. CONTRAST:  40mL GADAVIST GADOBUTROL 1 MMOL/ML IV SOLN COMPARISON:  CT angiogram head/neck 02/27/2022. FINDINGS: The examination is significantly motion degraded, limiting evaluation. Most notably, the axial and coronal T2-weighted sequences are moderate to severely motion degraded, the coronal T1-weighted sequence is moderate to severely motion degraded, the axial T1-weighted post-contrast fat saturated sequence is moderate to severely motion degraded and the coronal T1-weighted post-contrast fat saturated sequence is moderate to severely motion degraded. Orbits: Within described limitations, the globes appear normal in size and contour. No definite abnormality of the optic nerve sheath complexes, extraocular muscles or lack mole glands is identified. No intraorbital mass or abnormal intraorbital enhancement is identified. Visualized sinuses: No appreciable significant paranasal sinus disease. Soft tissues: No appreciable soft tissue abnormality within the face or upper neck. Limited intracranial: Whole brain diffusion-weighted  imaging is of good quality and reveals no evidence of acute infarct. IMPRESSION: Significantly motion degraded MR imaging of the orbits (with the majority of sequences moderate to severely motion degraded). Within this significant limitation, no definite acute abnormality is identified within the orbits. Whole brain diffusion-weighted imaging is of good quality and reveals no evidence of acute infarct. Electronically Signed   By: Kellie Simmering D.O.   On: 02/27/2022 16:29    My personal review of EKG: Rhythm NSR, Rate  54 /min, QTc 417    Assessment & Plan:    Principal Problem:   TIA (transient ischemic attack) Active Problems:   PAD (peripheral artery disease) ([facility])   CAD (coronary artery disease)   Hypertension   Hypothyroidism   History of tobacco abuse   Dyslipidemia   Carotid artery disease ([place])    TIA -Neurology input greatly appreciated, recommendation to admit for TIA work-up -Continue with aspirin -Goal is normotension, avoid hypotension, so no indication for permissive hypertension. -MRI brain was reviewed by neurology on-call, no evidence of acute CVA -We will follow neurology recommendation, will order 2D echo, check A1c and LDL, continue with statin, will monitor on telemetry, PT/OT/SLP will be consulted  Chest pain -Cardiology input greatly appreciated, no further work-up indicated at this hospital stay -Continue with aspirin, statin, metoprolol, telmisartan, and will increase Norvasc to 10 mg per cardiology recommendation.  Hypertension -Please see above discussion  Hypothyroidism -Continue with Synthroid  PAD/CAD/carotid artery disease -continue with aspirin and statin  Dyslipidemia -Check LDL, continue with home dose statin currently     DVT Prophylaxis Heparin   AM Labs Ordered, also please review Full Orders  Family Communication: Admission, patients condition and plan of care including tests being ordered have been discussed with the patient  who  indicate understanding and agree with the plan and Code Status.  Code Status Full  Likely DC to  home  Condition GUARDED    Consults called: tele neurology, cardiology seen in ED    Admission status: Observation    Time spent in minutes : 55 minutes   Phillips Climes M.D on 02/27/2022 at [place] PM   [facility] - Office  [phone]

## 2022-02-27 NOTE — ED Provider Notes (Signed)
3:50 PM-checkout from Dr. Alvino Chapel to evaluate patient after MRI imaging.  MRI was ordered to follow-up decreased vision in the right eye which was transient and occurred last evening.  He has a history of cardiac disease and carotid disease.  He is following annually for carotid artery disease.  He has a remote history of MI, but did not require stenting after catheterization.  He has noticed some dull chest pain today, here in the ER but it is gone now.  Yesterday when he had the vision loss he did not have pain.  He describes a progressive vision loss initially with scotoma like symptoms followed by complete vision loss in the right eye.  Symptoms seem to improve quickly after he took aspirin.  Total duration of visual abnormality was less than 10 minutes.  Patient has had ocular migraines, occasionally, in both eyes for many years.  He describes incident yesterday as being different than his typical ocular migraine. ? ?5:25 PM-Case discussed with Dr. Quinn Axe from neurology, she recommends hospitalization for TIA/transient retinal artery occlusion.  She will answer a telephone consultation note.  Patient can stay at this facility.  She recommends cardiac echo, and other usual stroke/TIA evaluations. ? ?4:45 PM-Dr. Harl Bowie has seen the patient.  He recommends increasing amlodipine to 10 mg a day, "as an antianginal."  Otherwise follow-up with him as an outpatient 5:45 PM ? ?5:55 PM-paged hospitalist for admission for TIA evaluation.  He is agreeable to admit patient, 6:10 PM ?  ?Daleen Bo, MD ?02/27/22 1810 ? ?

## 2022-02-27 NOTE — ED Triage Notes (Signed)
Pt reports an episode of vision loss last pm, vision went out top to bottom. Pt has history of occular migraines and took 2 baby aspirin and vision returned 20-30 minutes. Vision was then strobe light for a few hours. ?Pt also has a cardiac history and had ?Left anterior chest started yesterday after digging a drainage ditch.  ?Pt states he had a Gout attack over weekend and is on prednisone. ?Pt states he currently feels "off" but vision has returned to baseline. ?Pt reports that his wife and mother had blocked carotids and had occular strokes. Pt states he has some blockages in bilateral carotid. ?

## 2022-02-28 ENCOUNTER — Observation Stay (HOSPITAL_BASED_OUTPATIENT_CLINIC_OR_DEPARTMENT_OTHER): Payer: Medicare Other

## 2022-02-28 DIAGNOSIS — I779 Disorder of arteries and arterioles, unspecified: Secondary | ICD-10-CM | POA: Diagnosis not present

## 2022-02-28 DIAGNOSIS — G453 Amaurosis fugax: Secondary | ICD-10-CM

## 2022-02-28 DIAGNOSIS — E785 Hyperlipidemia, unspecified: Secondary | ICD-10-CM | POA: Diagnosis not present

## 2022-02-28 DIAGNOSIS — G459 Transient cerebral ischemic attack, unspecified: Secondary | ICD-10-CM | POA: Diagnosis not present

## 2022-02-28 LAB — COMPREHENSIVE METABOLIC PANEL
ALT: 31 U/L (ref 0–44)
AST: 24 U/L (ref 15–41)
Albumin: 3.5 g/dL (ref 3.5–5.0)
Alkaline Phosphatase: 44 U/L (ref 38–126)
Anion gap: 7 (ref 5–15)
BUN: 23 mg/dL (ref 8–23)
CO2: 30 mmol/L (ref 22–32)
Calcium: 8.4 mg/dL — ABNORMAL LOW (ref 8.9–10.3)
Chloride: 102 mmol/L (ref 98–111)
Creatinine, Ser: 0.85 mg/dL (ref 0.61–1.24)
GFR, Estimated: >60 mL/min (ref >60)
Glucose, Bld: 103 mg/dL — ABNORMAL HIGH (ref 70–99)
Potassium: 3.3 mmol/L — ABNORMAL LOW (ref 3.5–5.1)
Sodium: 139 mmol/L (ref 135–145)
Total Bilirubin: 0.5 mg/dL (ref 0.3–1.2)
Total Protein: 6.2 g/dL — ABNORMAL LOW (ref 6.5–8.1)

## 2022-02-28 LAB — CBC
HCT: 39.5 % (ref 39.0–52.0)
Hemoglobin: 13.3 g/dL (ref 13.0–17.0)
MCH: 30.8 pg (ref 26.0–34.0)
MCHC: 33.7 g/dL (ref 30.0–36.0)
MCV: 91.4 fL (ref 80.0–100.0)
Platelets: 128 10*3/uL — ABNORMAL LOW (ref 150–400)
RBC: 4.32 MIL/uL (ref 4.22–5.81)
RDW: 13.1 % (ref 11.5–15.5)
WBC: 6.1 10*3/uL (ref 4.0–10.5)
nRBC: 0 % (ref 0.0–0.2)

## 2022-02-28 LAB — ECHOCARDIOGRAM COMPLETE
AR max vel: 2.81 cm2
AV Area VTI: 2.8 cm2
AV Area mean vel: 2.74 cm2
AV Mean grad: 6 mmHg
AV Peak grad: 12.7 mmHg
Ao pk vel: 1.78 m/s
Area-P 1/2: 3.36 cm2
Calc EF: 64.2 %
Height: 68 in
MV VTI: 2.48 cm2
S' Lateral: 2.7 cm
Single Plane A2C EF: 61.1 %
Single Plane A4C EF: 67.3 %
Weight: 2953.6 oz

## 2022-02-28 LAB — HEMOGLOBIN A1C
Hgb A1c MFr Bld: 6.1 % — ABNORMAL HIGH (ref 4.8–5.6)
Mean Plasma Glucose: 128.37 mg/dL

## 2022-02-28 LAB — LIPID PANEL
Cholesterol: 111 mg/dL (ref 0–200)
HDL: 33 mg/dL — ABNORMAL LOW (ref >40)
LDL Cholesterol: 45 mg/dL (ref 0–99)
Total CHOL/HDL Ratio: 3.4 RATIO
Triglycerides: 163 mg/dL — ABNORMAL HIGH (ref <150)
VLDL: 33 mg/dL (ref 0–40)

## 2022-02-28 LAB — GLUCOSE, CAPILLARY
Glucose-Capillary: 96 mg/dL (ref 70–99)
Glucose-Capillary: 164 mg/dL — ABNORMAL HIGH (ref 70–99)

## 2022-02-28 MED ORDER — LEVOTHYROXINE SODIUM 75 MCG PO TABS: 175.0000 ug | ORAL_TABLET | ORAL | Status: DC

## 2022-02-28 MED ORDER — LEVOTHYROXINE SODIUM 75 MCG PO TABS
150.0000 ug | ORAL_TABLET | ORAL | Status: AC
  Administered 2022-02-28: 150 ug via ORAL
  Filled 2022-02-28: qty 2

## 2022-02-28 MED ORDER — CLOPIDOGREL BISULFATE 75 MG PO TABS: 75.0000 mg | ORAL_TABLET | Freq: Every day | ORAL | 11 refills | Status: DC

## 2022-02-28 MED ORDER — LEVOTHYROXINE SODIUM 75 MCG PO TABS: 150.0000 ug | ORAL_TABLET | ORAL | Status: DC

## 2022-02-28 MED ORDER — AMLODIPINE BESYLATE 10 MG PO TABS: 10.0000 mg | ORAL_TABLET | Freq: Every day | ORAL | 1 refills | Status: DC

## 2022-02-28 NOTE — Evaluation (Signed)
Physical Therapy Evaluation ?Patient Details ?Name: Franchot Mimes ?MRN: [id number] ?DOB: 1946/09/26 ?Today's Date: 02/28/2022 ? ?History of Present Illness ? Zeno Jelley  is a 76 y.o. 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  ?Clinical Impression ? Patient functioning near baseline for mobility and ambulation. He demonstrates mild LE weakness, no LE sensation deficits, and fair balance. He does not require assist or AD for mobility. Demonstrates minimal unsteadiness upon initial transfer to standing and with ambulation but no loss of balance. He is returned to room at end of session and is educated on getting referral to outpatient PT if any deficit occurs following d/c. Patient discharged to care of nursing for ambulation daily as tolerated for length of stay. ? ?   ? ?Recommendations for follow up therapy are one component of a multi-disciplinary discharge planning process, led by the attending physician.  Recommendations may be updated based on patient status, additional functional criteria and insurance authorization. ? ?Follow Up Recommendations No PT follow up ? ?  ?Assistance Recommended at Discharge PRN  ?Patient can return home with the  following ?   ? ?  ?Equipment Recommendations None recommended by PT  ?Recommendations for Other Services ?    ?  ?Functional Status Assessment Patient has had a recent decline in their functional status and demonstrates the ability to make significant improvements in function in a reasonable and predictable amount of time.  ? ?  ?Precautions / Restrictions Precautions ?Precautions: None ?Restrictions ?Weight Bearing Restrictions: No  ? ?  ? ?Mobility ? Bed Mobility ?Overal bed mobility: Independent ?  ?  ?  ?  ?  ?  ?  ?  ? ?Transfers ?Overall transfer level: Independent ?  ?  ?  ?  ?  ?  ?  ?  ?  ?  ? ?Ambulation/Gait ?Ambulation/Gait assistance: Modified independent (Device/Increase time) ?Gait Distance (Feet): 200 Feet ?Assistive device: None ?  ?  ?  ?  ?General Gait Details: slightly slow and intermittent unsteadiness without loss of balance ? ?Stairs ?  ?  ?  ?  ?  ? ?Wheelchair Mobility ?  ? ?Modified Rankin (Stroke Patients Only) ?  ? ?  ? ?Balance Overall balance assessment: Mild deficits observed, not formally tested ?  ?  ?  ?  ?  ?  ?  ?  ?  ?  ?  ?  ?  ?  ?  ?  ?  ?  ?   ? ? ? ?Pertinent Vitals/Pain Pain Assessment ?Pain Assessment: No/denies pain  ? ? ?Home Living Family/patient expects to be discharged to:: Private residence ?Living Arrangements: Spouse/significant other ?Available Help at Discharge: Family ?Type of Home: House ?Home Access: Stairs to enter ?Entrance Stairs-Rails: None ?Entrance Stairs-Number  of Steps: 2 ?  ?Home Layout: One level ?Home Equipment: Conservation officer, nature (2 wheels);Shower seat;Cane - single point ?   ?  ?Prior Function Prior Level of Function : Independent/Modified Independent ?  ?  ?  ?  ?  ?  ?Mobility Comments: community ambulation without AD ?ADLs Comments: Independent ?  ? ? ?Hand Dominance  ?   ? ?  ?Extremity/Trunk Assessment  ? Upper Extremity Assessment ?Upper Extremity Assessment: Defer to OT evaluation ?  ? ?Lower Extremity Assessment ?Lower Extremity Assessment:  Overall WFL for tasks assessed ?  ? ?Cervical / Trunk Assessment ?Cervical / Trunk Assessment: Normal  ?Communication  ? Communication: No difficulties  ?Cognition Arousal/Alertness: Awake/alert ?Behavior During Therapy: [facility] for tasks assessed/performed ?Overall Cognitive Status: Within Functional Limits for tasks assessed ?  ?  ?  ?  ?  ?  ?  ?  ?  ?  ?  ?  ?  ?  ?  ?  ?  ?  ?  ? ?  ?General Comments   ? ?  ?Exercises    ? ?Assessment/Plan  ?  ?PT Assessment Patient does not need any further PT services  ?PT Problem List   ? ?   ?  ?PT Treatment Interventions     ? ?PT Goals (Current goals can be found in the Care Plan section)  ?Acute Rehab PT Goals ?Patient Stated Goal: Return home ?PT Goal Formulation: With patient ?Time For Goal Achievement: 02/28/22 ?Potential to Achieve Goals: Good ? ?  ?Frequency   ?  ? ? ?Co-evaluation   ?  ?  ?  ?  ? ? ?  ?AM-PAC PT "6 Clicks" Mobility  ?Outcome Measure Help needed turning from your back to your side while in a flat bed without using bedrails?: None ?Help needed moving from lying on your back to sitting on the side of a flat bed without using bedrails?: None ?Help needed moving to and from a bed to a chair (including a wheelchair)?: None ?Help needed standing up from a chair using your arms (e.g., wheelchair or bedside chair)?: None ?Help needed to walk in hospital room?: None ?Help needed climbing 3-5 steps with a railing? : A Little ?6 Click Score: 23 ? ?  ?End of Session   ?Activity Tolerance: Patient tolerated treatment well ?Patient left: in bed;with call bell/phone within reach ?Nurse Communication: Mobility status ?PT Visit Diagnosis: Other abnormalities of gait and mobility (R26.89) ?  ? ?Time: [id number] ?PT Time Calculation (min) (ACUTE ONLY): 12 min ? ? ?Charges:   PT Evaluation ?$PT Eval Low Complexity: 1 Low ?  ?  ?   ? ? ?12:11 PM, 02/28/22 ?Mearl Latin PT, DPT ?Physical Therapist at [facility] ?[facility] ? ? ?

## 2022-02-28 NOTE — Discharge Summary (Signed)
Physician Discharge Summary   Patient: Christopher Randolph MRN: [id number] DOB: [birth year]  Admit date:     02/27/2022  Discharge date: 02/28/22  Discharge Physician: Christopher Randolph   PCP: Christopher Labrum, MD   Recommendations at discharge:   Patient has been referred to outpatient neurology for further follow-up He plans to follow-up with his ophthalmologist, Dr. Coralyn Randolph He will also need follow-up with his cardiologist, Christopher Randolph  Discharge Diagnoses: Principal Problem:   TIA (transient ischemic attack) Active Problems:   PAD (peripheral artery disease) ([place])   CAD (coronary artery disease)   Hypertension   Hypothyroidism   History of tobacco abuse   Dyslipidemia   Carotid artery disease ([place])  Resolved Problems:   * No resolved hospital problems. *   Hospital Course: 76 year old male with a history of coronary artery disease, carotid artery disease, hypertension, hyperlipidemia, type 2 diabetes, presented with transient vision loss in his right eye.  He also had chest pain prior to admission.  He was seen by cardiology for chest discomfort.  It was not felt that further inpatient cardiac work-up was indicated.  This felt reasonable to follow-up with cardiology as an outpatient.  They did recommend to increase Norvasc to 10 mg daily.  Regarding his transient vision loss, this is felt to be possible TIA.  He was seen by neurology and underwent CT angiogram of the head and neck as well as MRI of the orbits/brain.  MRI was reviewed by Christopher Randolph, and was felt that patient did not have any evidence of acute CVA.  Imaging of the orbits was also unremarkable.  He was admitted to the hospital and underwent 2D echocardiogram that did not show any significant findings.  LDL was noted to be less than 70.  A1c 6.1.  Since patient was already on aspirin 81 mg daily, recommendations were to change to Plavix 75 mg p.o. daily and follow-up with neurology as an outpatient.  Patient has chronic  carotid artery disease and current findings appear to be near baseline.  He was noted to be bradycardic during his hospital stay with a heart rate in the 40s to 50s.  He has been taking metoprolol 25 mg twice daily.  He also reports some dizziness on standing.  Orthostatics were checked and blood pressure were noticed to be stable.  We will discontinue further beta-blockers for now until he follows up with cardiology.  Will need to follow-up with PCP/cardiology to monitor heart rate.  He is feeling back to baseline and feels ready for discharge home.            Consultants: Neurology Cardiology Procedures performed:   Disposition: Home Diet recommendation:  Discharge Diet Orders (From admission, onward)     Start     Ordered   02/28/22 0000  Diet - low sodium heart healthy        02/28/22 1442           Cardiac and Carb modified diet  DISCHARGE MEDICATION: Allergies as of 02/28/2022       Reactions   Erythromycin Swelling   swelling of lips swelling of lips swelling of lips swelling of lips   Shellfish Allergy Other (See Comments), Rash   Gout flares Gout flares Gout flares Gout flares   Azithromycin Swelling        Medication List     STOP taking these medications    aspirin EC 81 MG tablet   metoprolol tartrate 25 MG tablet Commonly known as:  LOPRESSOR   predniSONE 10 MG (21) Tbpk tablet Commonly known as: STERAPRED UNI-PAK 21 TAB       TAKE these medications    amLODipine 10 MG tablet Commonly known as: NORVASC Take 1 tablet (10 mg total) by mouth daily. What changed:  medication strength how much to take   atorvastatin 20 MG tablet Commonly known as: LIPITOR TAKE 1 TABLET BY MOUTH DAILY   clopidogrel 75 MG tablet Commonly known as: Plavix Take 1 tablet (75 mg total) by mouth daily.   cyclobenzaprine 10 MG tablet Commonly known as: FLEXERIL Take 10 mg by mouth every 8 (eight) hours as needed for muscle spasms.   fluticasone 50  MCG/ACT nasal spray Commonly known as: FLONASE Place 2 sprays into both nostrils daily.   latanoprost 0.005 % ophthalmic solution Commonly known as: XALATAN Place 1 drop into the left eye at bedtime.   levothyroxine 175 MCG tablet Commonly known as: SYNTHROID Take 175 mcg by mouth See admin instructions. Alternates taking 150 mg daily for 2 days then 175 mcg the third day then repeat   levothyroxine 150 MCG tablet Commonly known as: SYNTHROID Take 150 mcg by mouth See admin instructions. Alternates taking 150 mg daily for 2 days then 175 mcg the third day then repeat   Lotemax SM 0.38 % Gel Generic drug: Loteprednol Etabonate Place 1 drop into the left eye 4 (four) times daily.   metFORMIN 500 MG tablet Commonly known as: GLUCOPHAGE Take 500 mg by mouth daily.   Mitigare 0.6 MG Caps Generic drug: Colchicine Take 1 capsule by mouth as needed (gout attacks).   mometasone 0.1 % ointment Commonly known as: ELOCON Apply 1 application topically daily.   naproxen sodium 220 MG tablet Commonly known as: ALEVE Take 220 mg by mouth daily as needed (for pain.).   nitroGLYCERIN 0.4 MG SL tablet Commonly known as: NITROSTAT DISSOLVE 1 TABLET UNDER THE TONGUE EVERY 5 MINUTES AS NEEDED FOR CHEST PAIN. DO NOT EXCEED A TOTAL OF 3 DOSES IN 15 MINUTES. What changed: See the new instructions.   prednisoLONE acetate 1 % ophthalmic suspension Commonly known as: PRED FORTE Place 1 drop into the left eye daily.   Prolensa 0.07 % Soln Generic drug: Bromfenac Sodium Apply 1 drop to eye in the morning, at noon, in the evening, and at bedtime.   Restasis 0.05 % ophthalmic emulsion Generic drug: cycloSPORINE Place 1 drop into both eyes 2 (two) times daily.   tamsulosin 0.4 MG Caps capsule Commonly known as: FLOMAX Take 0.8 mg by mouth daily.   telmisartan 80 MG tablet Commonly known as: [place] 1 TABLET BY MOUTH EVERY DAY        Follow-up Information     Randolph, Christopher Evener,  MD.   Specialty: Family Medicine Why: As needed Contact information: [address] [place] [address] [phone]                 Discharge Exam: [name] Weights   02/27/22 1115 02/27/22 1404 02/27/22 2238  Weight: 82.1 kg 82.6 kg 83.7 kg   General exam: Alert, awake, oriented x 3 Respiratory system: Clear to auscultation. Respiratory effort normal. Cardiovascular system:RRR. No murmurs, rubs, gallops. Gastrointestinal system: Abdomen is nondistended, soft and nontender. No organomegaly or masses felt. Normal bowel sounds heard. Central nervous system: Alert and oriented. No focal neurological deficits. Extremities: No C/C/E, +pedal pulses Skin: No rashes, lesions or ulcers Psychiatry: Judgement and insight appear normal. Mood & affect appropriate.  Condition at discharge: good  The results of significant diagnostics from this hospitalization (including imaging, microbiology, ancillary and laboratory) are listed below for reference.   Imaging Studies: CT Angio Head W or Wo Contrast  Result Date: 02/27/2022 CLINICAL DATA:  Neuro deficit, acute, stroke suspected; episode of abnormal vision, history of ocular migraines EXAM: CT ANGIOGRAPHY HEAD AND NECK TECHNIQUE: Multidetector CT imaging of the head and neck was performed using the standard protocol during bolus administration of intravenous contrast. Multiplanar CT image reconstructions and MIPs were obtained to evaluate the vascular anatomy. Carotid stenosis measurements (when applicable) are obtained utilizing NASCET criteria, using the distal internal carotid diameter as the denominator. RADIATION DOSE REDUCTION: This exam was performed according to the departmental dose-optimization program which includes automated exposure control, adjustment of the mA and/or kV according to patient size and/or use of iterative reconstruction technique. CONTRAST:  8m OMNIPAQUE IOHEXOL 350 MG/ML SOLN COMPARISON:  None. FINDINGS: CT HEAD  Brain: There is no acute intracranial hemorrhage, mass effect, or edema. Age-indeterminate small infarct of the right postcentral gyrus. Chronic appearing small vessel infarct of the right caudate. There is no extra-axial fluid collection. Ventricles and sulci are within normal limits in size and configuration. Vascular: No hyperdense vessel. Skull: Calvarium is unremarkable. Sinuses/Orbits: No acute finding. Other: None. Review of the MIP images confirms the above findings CTA NECK Aortic arch: Mixed plaque along the arch and patent great vessel origins. Right carotid system: Patent. Calcified plaque along the common carotid without stenosis. Primarily calcified plaque along the proximal internal carotid. Stenosis evaluation is limited by streak artifact but there is at least 70% narrowing. Left carotid system: Patent. Mild calcified plaque along the distal common carotid without stenosis. Primarily calcified plaque along the proximal internal carotid. Stenosis evaluation is limited by streak artifact but there is at least 70% stenosis. Vertebral arteries: Patent. Left vertebral is dominant. Mild calcified plaque. No significant stenosis. Skeleton: Cervical spine degenerative changes greatest at C5-C6 and C6-C7. Other neck: Unremarkable. Upper chest: Included lungs are clear. Review of the MIP images confirms the above findings CTA HEAD Anterior circulation: Intracranial internal carotid arteries are patent with calcified plaque causing mild stenosis. Anterior and middle cerebral arteries are patent. Posterior circulation: Intracranial vertebral arteries are patent. Basilar artery is patent. Major cerebellar artery origins are patent. Small bilateral posterior communicating arteries are present. Posterior cerebral arteries are patent. Venous sinuses: Patent as allowed by contrast bolus timing. Review of the MIP images confirms the above findings IMPRESSION: No acute intracranial hemorrhage. Age-indeterminate small  infarct of the right postcentral gyrus. 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 No large vessel occlusion. Plaque at the ICA origins causes at least 70% stenosis. Electronically Signed   By: PMacy MisM.D.   On: 02/27/2022 14:47   DG Chest 1 View  Result Date: 02/27/2022 CLINICAL DATA:  Transient vision loss last night. Anterior chest pain after digging a trench. Ex-smoker. EXAM: CHEST  1 VIEW COMPARISON:  06/23/2010 FINDINGS: Poor inspiration. Mild bibasilar atelectasis. Otherwise, clear lungs with normal vascularity. Minimal peribronchial thickening without significant change. Normal sized heart. Aortic arch calcifications. Mild thoracic spine degenerative changes. IMPRESSION: 1. Poor inspiration with mild bibasilar atelectasis. 2. Minimal chronic bronchitic changes. Electronically Signed   By: SClaudie ReveringM.D.   On: 02/27/2022 11:49   CT Angio Neck W and/or Wo Contrast  Result Date: 02/27/2022 CLINICAL DATA:  Neuro deficit, acute, stroke suspected; episode of abnormal vision, history of ocular migraines EXAM: CT ANGIOGRAPHY HEAD AND NECK TECHNIQUE: Multidetector  CT imaging of the head and neck was performed using the standard protocol during bolus administration of intravenous contrast. Multiplanar CT image reconstructions and MIPs were obtained to evaluate the vascular anatomy. Carotid stenosis measurements (when applicable) are obtained utilizing NASCET criteria, using the distal internal carotid diameter as the denominator. RADIATION DOSE REDUCTION: This exam was performed according to the departmental dose-optimization program which includes automated exposure control, adjustment of the mA and/or kV according to patient size and/or use of iterative reconstruction technique. CONTRAST:  21m OMNIPAQUE IOHEXOL 350 MG/ML SOLN COMPARISON:  None. FINDINGS: CT HEAD Brain: There is no acute intracranial hemorrhage, mass effect, or edema. Age-indeterminate small infarct of the right postcentral gyrus.  Chronic appearing small vessel infarct of the right caudate. There is no extra-axial fluid collection. Ventricles and sulci are within normal limits in size and configuration. Vascular: No hyperdense vessel. Skull: Calvarium is unremarkable. Sinuses/Orbits: No acute finding. Other: None. Review of the MIP images confirms the above findings CTA NECK Aortic arch: Mixed plaque along the arch and patent great vessel origins. Right carotid system: Patent. Calcified plaque along the common carotid without stenosis. Primarily calcified plaque along the proximal internal carotid. Stenosis evaluation is limited by streak artifact but there is at least 70% narrowing. Left carotid system: Patent. Mild calcified plaque along the distal common carotid without stenosis. Primarily calcified plaque along the proximal internal carotid. Stenosis evaluation is limited by streak artifact but there is at least 70% stenosis. Vertebral arteries: Patent. Left vertebral is dominant. Mild calcified plaque. No significant stenosis. Skeleton: Cervical spine degenerative changes greatest at C5-C6 and C6-C7. Other neck: Unremarkable. Upper chest: Included lungs are clear. Review of the MIP images confirms the above findings CTA HEAD Anterior circulation: Intracranial internal carotid arteries are patent with calcified plaque causing mild stenosis. Anterior and middle cerebral arteries are patent. Posterior circulation: Intracranial vertebral arteries are patent. Basilar artery is patent. Major cerebellar artery origins are patent. Small bilateral posterior communicating arteries are present. Posterior cerebral arteries are patent. Venous sinuses: Patent as allowed by contrast bolus timing. Review of the MIP images confirms the above findings IMPRESSION: No acute intracranial hemorrhage. Age-indeterminate small infarct of the right postcentral gyrus. 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 No large vessel occlusion. Plaque at the ICA origins causes  at least 70% stenosis. Electronically Signed   By: PMacy MisM.D.   On: 02/27/2022 14:47   ECHOCARDIOGRAM COMPLETE  Result Date: 02/28/2022    ECHOCARDIOGRAM REPORT   Patient Name:   Christopher OPDAHLDate of Exam: 02/28/2022 Medical Rec #:  [id number]       Height:       68.0 in Accession #:    [id number]      Weight:       184.6 lb Date of Birth:  101/19/1947       BSA:          1.975 m Patient Age:    79years         BP:           132/52 mmHg Patient Gender: M                HR:           53 bpm. [facility]  [facility]: 2D Echo, Cardiac Doppler and Color Doppler Indications:    TIA  History:        Patient has prior history of Echocardiogram examinations, most  recent 08/22/2014. CAD, PAD and TIA, Signs/Symptoms:Murmur; Risk                 Factors:Hypertension, Dyslipidemia and Former Smoker.  Sonographer:    Wenda Low Referring Phys: [place]  1. Left ventricular ejection fraction, by estimation, is 60 to 65%. The left ventricle has normal function. The left ventricle has no regional wall motion abnormalities. Left ventricular diastolic parameters are consistent with Grade II diastolic dysfunction (pseudonormalization). Elevated left atrial pressure.  2. Right ventricular systolic function is normal. The right ventricular size is normal. There is normal pulmonary artery systolic pressure. The estimated right ventricular systolic pressure is 10.2 mmHg.  3. Left atrial size was mildly dilated.  4. The mitral valve is normal in structure. No evidence of mitral valve regurgitation. No evidence of mitral stenosis.  5. The aortic valve is tricuspid. There is mild calcification of the aortic valve. Aortic valve regurgitation is not visualized. Aortic valve sclerosis is present, with no evidence of aortic valve stenosis.  6. The inferior vena cava is normal in size with greater than 50% respiratory variability, suggesting right atrial pressure of 3  mmHg. FINDINGS  Left Ventricle: Left ventricular ejection fraction, by estimation, is 60 to 65%. The left ventricle has normal function. The left ventricle has no regional wall motion abnormalities. The left ventricular internal cavity size was normal in size. There is  no left ventricular hypertrophy. Left ventricular diastolic parameters are consistent with Grade II diastolic dysfunction (pseudonormalization). Elevated left atrial pressure. Right Ventricle: The right ventricular size is normal. No increase in right ventricular wall thickness. Right ventricular systolic function is normal. There is normal pulmonary artery systolic pressure. The tricuspid regurgitant velocity is 2.38 m/s, and  with an assumed right atrial pressure of 3 mmHg, the estimated right ventricular systolic pressure is 72.5 mmHg. Left Atrium: Left atrial size was mildly dilated. Right Atrium: Right atrial size was normal in size. Pericardium: There is no evidence of pericardial effusion. Mitral Valve: The mitral valve is normal in structure. Mild mitral annular calcification. No evidence of mitral valve regurgitation. No evidence of mitral valve stenosis. MV peak gradient, 6.2 mmHg. The mean mitral valve gradient is 2.0 mmHg. Tricuspid Valve: The tricuspid valve is normal in structure. Tricuspid valve regurgitation is not demonstrated. No evidence of tricuspid stenosis. Aortic Valve: The aortic valve is tricuspid. There is mild calcification of the aortic valve. Aortic valve regurgitation is not visualized. Aortic valve sclerosis is present, with no evidence of aortic valve stenosis. Aortic valve mean gradient measures 6.0 mmHg. Aortic valve peak gradient measures 12.7 mmHg. Aortic valve area, by VTI measures 2.80 cm. Pulmonic Valve: The pulmonic valve was normal in structure. Pulmonic valve regurgitation is not visualized. No evidence of pulmonic stenosis. Aorta: The aortic root is normal in size and structure. Venous: The inferior vena  cava is normal in size with greater than 50% respiratory variability, suggesting right atrial pressure of 3 mmHg. IAS/Shunts: No atrial level shunt detected by color flow Doppler.  LEFT VENTRICLE PLAX 2D LVIDd:         4.80 cm     Diastology LVIDs:         2.70 cm     LV e' medial:    7.07 cm/s LV PW:         1.10 cm     LV E/e' medial:  15.3 LV IVS:        1.10 cm     LV  e' lateral:   8.27 cm/s LVOT diam:     2.00 cm     LV E/e' lateral: 13.1 LV SV:         110 LV SV Index:   56 LVOT Area:     3.14 cm  LV Volumes (MOD) LV vol d, MOD A2C: 58.1 ml LV vol d, MOD A4C: 77.9 ml LV vol s, MOD A2C: 22.6 ml LV vol s, MOD A4C: 25.5 ml LV SV MOD A2C:     35.5 ml LV SV MOD A4C:     77.9 ml LV SV MOD BP:      43.0 ml RIGHT VENTRICLE RV Basal diam:  3.40 cm RV Mid diam:    2.80 cm LEFT ATRIUM             Index        RIGHT ATRIUM           Index LA diam:        4.40 cm 2.23 cm/m   RA Area:     17.70 cm LA Vol (A2C):   78.4 ml 39.69 ml/m  RA Volume:   41.90 ml  21.21 ml/m LA Vol (A4C):   72.7 ml 36.80 ml/m LA Biplane Vol: 75.5 ml 38.22 ml/m  AORTIC VALVE                     PULMONIC VALVE AV Area (Vmax):    2.81 cm      PV Vmax:       0.92 m/s AV Area (Vmean):   2.74 cm      PV Peak grad:  3.4 mmHg AV Area (VTI):     2.80 cm AV Vmax:           178.00 cm/s AV Vmean:          109.000 cm/s AV VTI:            0.392 m AV Peak Grad:      12.7 mmHg AV Mean Grad:      6.0 mmHg LVOT Vmax:         159.00 cm/s LVOT Vmean:        95.200 cm/s LVOT VTI:          0.350 m LVOT/AV VTI ratio: 0.89  AORTA Ao Root diam: 3.30 cm Ao Asc diam:  3.10 cm MITRAL VALVE                TRICUSPID VALVE MV Area (PHT): 3.36 cm     TR Peak grad:   22.7 mmHg MV Area VTI:   2.48 cm     TR Vmax:        238.00 cm/s MV Peak grad:  6.2 mmHg MV Mean grad:  2.0 mmHg     SHUNTS MV Vmax:       1.25 m/s     Systemic VTI:  0.35 m MV Vmean:      56.7 cm/s    Systemic Diam: 2.00 cm MV Decel Time: 226 msec MV E velocity: 108.00 cm/s MV A velocity: 93.80 cm/s MV E/A  ratio:  1.15 Mihai Croitoru MD Electronically signed by Sanda Klein MD Signature Date/Time: 02/28/2022/12:25:16 PM    Final    MR ORBITS W WO CONTRAST  Result Date: 02/27/2022 CLINICAL DATA:  Vision loss, monocular. Additional history provided: Right eye vision loss for 1 day. EXAM: MRI OF THE ORBITS WITHOUT AND WITH CONTRAST TECHNIQUE: Multiplanar, multi-echo pulse sequences of the  orbits and surrounding structures were acquired including fat saturation techniques, before and after intravenous contrast administration. CONTRAST:  57m GADAVIST GADOBUTROL 1 MMOL/ML IV SOLN COMPARISON:  CT angiogram head/neck 02/27/2022. FINDINGS: The examination is significantly motion degraded, limiting evaluation. Most notably, the axial and coronal T2-weighted sequences are moderate to severely motion degraded, the coronal T1-weighted sequence is moderate to severely motion degraded, the axial T1-weighted post-contrast fat saturated sequence is moderate to severely motion degraded and the coronal T1-weighted post-contrast fat saturated sequence is moderate to severely motion degraded. Orbits: Within described limitations, the globes appear normal in size and contour. No definite abnormality of the optic nerve sheath complexes, extraocular muscles or lack mole glands is identified. No intraorbital mass or abnormal intraorbital enhancement is identified. Visualized sinuses: No appreciable significant paranasal sinus disease. Soft tissues: No appreciable soft tissue abnormality within the face or upper neck. Limited intracranial: Whole brain diffusion-weighted imaging is of good quality and reveals no evidence of acute infarct. IMPRESSION: Significantly motion degraded MR imaging of the orbits (with the majority of sequences moderate to severely motion degraded). Within this significant limitation, no definite acute abnormality is identified within the orbits. Whole brain diffusion-weighted imaging is of good quality and reveals  no evidence of acute infarct. Electronically Signed   By: KKellie SimmeringD.O.   On: 02/27/2022 16:29    Microbiology: Results for orders placed or performed during the hospital encounter of 02/27/22  Resp Panel by RT-PCR (Flu A&B, Covid) Nasopharyngeal Swab     Status: None   Collection Time: 02/27/22  5:38 PM   Specimen: Nasopharyngeal Swab; Nasopharyngeal(NP) swabs in vial transport medium  Result Value Ref Range Status   SARS Coronavirus 2 by RT PCR NEGATIVE NEGATIVE Final    Comment: (NOTE) SARS-CoV-2 target nucleic acids are NOT DETECTED.  The SARS-CoV-2 RNA is generally detectable in upper respiratory specimens during the acute phase of infection. The lowest concentration of SARS-CoV-2 viral copies this assay can detect is 138 copies/mL. A negative result does not preclude SARS-Cov-2 infection and should not be used as the sole basis for treatment or other patient management decisions. A negative result may occur with  improper specimen collection/handling, submission of specimen other than nasopharyngeal swab, presence of viral mutation(s) within the areas targeted by this assay, and inadequate number of viral copies(<138 copies/mL). A negative result must be combined with clinical observations, patient history, and epidemiological information. The expected result is Negative.  Fact Sheet for Patients:  hEntrepreneurPulse.com.au Fact Sheet for Healthcare Providers:  hIncredibleEmployment.be This test is no t yet approved or cleared by the [place] and  has been authorized for detection and/or diagnosis of SARS-CoV-2 by FDA under an Emergency Use Authorization (EUA). This EUA will remain  in effect (meaning this test can be used) for the duration of the COVID-19 declaration under Section 564(b)(1) of the Act, 21 U.S.C.section 360bbb-3(b)(1), unless the authorization is terminated  or revoked sooner.       Influenza A by PCR  NEGATIVE NEGATIVE Final   Influenza B by PCR NEGATIVE NEGATIVE Final    Comment: (NOTE) The Xpert Xpress SARS-CoV-2/FLU/RSV plus assay is intended as an aid in the diagnosis of influenza from Nasopharyngeal swab specimens and should not be used as a sole basis for treatment. Nasal washings and aspirates are unacceptable for Xpert Xpress SARS-CoV-2/FLU/RSV testing.  Fact Sheet for Patients: hEntrepreneurPulse.com.au Fact Sheet for Healthcare Providers: hIncredibleEmployment.be This test is not yet approved or cleared by the [place] FDA and has been authorized for detection and/or diagnosis of SARS-CoV-2 by FDA under an Emergency Use Authorization (EUA). This EUA will remain in effect (meaning this test can be used) for the duration of the COVID-19 declaration under Section 564(b)(1) of the Act, 21 U.S.C. section 360bbb-3(b)(1), unless the authorization is terminated or revoked.  Performed at [facility], [address] [place], [place] [address]     Labs: CBC: Recent Labs  Lab 02/27/22 1218 02/28/22 0525  WBC 6.5 6.1  HGB 13.7 13.3  HCT 40.5 39.5  MCV 91.6 91.4  PLT 127* 086*   Basic Metabolic Panel: Recent Labs  Lab 02/27/22 1218 02/28/22 0525  NA 140 139  K 3.7 3.3*  CL 105 102  CO2 25 30  GLUCOSE 136* 103*  BUN 21 23  CREATININE 0.87 0.85  CALCIUM 9.0 8.4*   Liver Function Tests: Recent Labs  Lab 02/28/22 0525  AST 24  ALT 31  ALKPHOS 44  BILITOT 0.5  PROT 6.2*  ALBUMIN 3.5   CBG: Recent Labs  Lab 02/28/22 0720 02/28/22 1106  GLUCAP 96 164*    Discharge time spent: greater than 30 minutes.  Signed: Kathie Dike, MD [facility] 02/28/2022

## 2022-02-28 NOTE — Progress Notes (Signed)
SLP Cancellation Note ? ?Patient Details ?Name: Christopher Randolph ?MRN: [id number] ?DOB: 01-20-1946 ? ? ?Cancelled treatment:       Reason Eval/Treat Not Completed: SLP screened, no needs identified, will sign off ? ?Thank you, ? ?Genene Churn, [place] ?[phone] ? ?Christopher Randolph ?02/28/2022, 9:53 AM ?

## 2022-02-28 NOTE — Progress Notes (Incomplete)
*  PRELIMINARY RESULTS* ?Echocardiogram ?2D Echocardiogram has been performed. ? ?Christopher Randolph ?02/28/2022, 10:08 AM ?

## 2022-03-03 ENCOUNTER — Other Ambulatory Visit: Payer: Self-pay

## 2022-03-03 ENCOUNTER — Encounter (INDEPENDENT_AMBULATORY_CARE_PROVIDER_SITE_OTHER): Payer: Medicare Other | Admitting: Ophthalmology

## 2022-03-03 DIAGNOSIS — H35033 Hypertensive retinopathy, bilateral: Secondary | ICD-10-CM | POA: Diagnosis not present

## 2022-03-03 DIAGNOSIS — G453 Amaurosis fugax: Secondary | ICD-10-CM | POA: Diagnosis not present

## 2022-03-03 DIAGNOSIS — I10 Essential (primary) hypertension: Secondary | ICD-10-CM

## 2022-03-03 DIAGNOSIS — H43811 Vitreous degeneration, right eye: Secondary | ICD-10-CM | POA: Diagnosis not present

## 2022-03-04 ENCOUNTER — Other Ambulatory Visit: Payer: Self-pay | Admitting: Cardiology

## 2022-03-11 ENCOUNTER — Ambulatory Visit: Payer: Medicare Other | Admitting: Diagnostic Neuroimaging

## 2022-03-11 ENCOUNTER — Encounter: Payer: Self-pay | Admitting: Diagnostic Neuroimaging

## 2022-03-11 VITALS — BP 133/70 | HR 86 | Ht 68.0 in | Wt 187.0 lb

## 2022-03-11 DIAGNOSIS — G453 Amaurosis fugax: Secondary | ICD-10-CM

## 2022-03-11 DIAGNOSIS — H02402 Unspecified ptosis of left eyelid: Secondary | ICD-10-CM | POA: Diagnosis not present

## 2022-03-11 NOTE — Progress Notes (Signed)
? ?[facility] ? ?PATIENT: Christopher Randolph ?DOB: 1946-05-07 ? ?REFERRING CLINICIAN: Kathie Dike, MD ?HISTORY FROM: patient  ?REASON FOR VISIT: new consult  ? ? ?HISTORICAL ? ?CHIEF COMPLAINT:  ?Chief Complaint  ?Patient presents with  ? Transient Ischemic Attack  ?  Rm 6 New Pt,  ED Referral  ? ? ?HISTORY OF PRESENT ILLNESS:  ? ?76 year old male with hypertension, hyperlipidemia, diabetes coronary artery disease, peripheral arterial disease, carotid artery disease, here for evaluation of right eye visual disturbance.  02/26/2022 patient was at home and had some abnormal visual disturbance in his right eye seeing sparkling shooting color sensations.  Eventually he had 5 minutes of right eye vision loss which was complete, but then resolved.  The next day he went to the emergency room for evaluation.  He was admitted for TIA stroke work-up.  Patient has been taking aspirin 81 mg a day and this was changed to Plavix 75 mg daily.  Statin was added.  Blood pressure control was obtained.  CTA of the head and neck showed at least 70% bilateral internal carotid artery stenosis. ? ? ? ? ? ?REVIEW OF SYSTEMS: Full 14 system review of systems performed and negative with exception of: as per HPI. ? ?ALLERGIES: ?Allergies  ?Allergen Reactions  ? Erythromycin Swelling  ?  swelling of lips ?swelling of lips ?swelling of lips ?swelling of lips ?  ? Shellfish Allergy Other (See Comments) and Rash  ?  Gout flares ?Gout flares ?Gout flares ?Gout flares  ? Azithromycin Swelling  ? ? ?HOME MEDICATIONS: ?Outpatient Medications Prior to Visit  ?Medication Sig Dispense Refill  ? amLODipine (NORVASC) 10 MG tablet Take 1 tablet (10 mg total) by mouth daily. 30 tablet 1  ? atorvastatin (LIPITOR) 20 MG tablet TAKE 1 TABLET BY MOUTH DAILY (Patient taking differently: Take 20 mg by mouth daily.) 90 tablet 1  ? Bromfenac Sodium (PROLENSA) 0.07 % SOLN Apply 1 drop to eye in the morning, at noon, in the evening, and at  bedtime. 3 mL 4  ? clopidogrel (PLAVIX) 75 MG tablet Take 1 tablet (75 mg total) by mouth daily. 30 tablet 11  ? cyclobenzaprine (FLEXERIL) 10 MG tablet Take 10 mg by mouth every 8 (eight) hours as needed for muscle spasms.    ? fluticasone (FLONASE) 50 MCG/ACT nasal spray Place 2 sprays into both nostrils daily.    ? latanoprost (XALATAN) 0.005 % ophthalmic solution Place 1 drop into the left eye at bedtime.    ? levothyroxine (SYNTHROID, LEVOTHROID) 150 MCG tablet Take 150 mcg by mouth See admin instructions. Alternates taking 150 mg daily for 2 days then 175 mcg the third day then repeat  1  ? levothyroxine (SYNTHROID, LEVOTHROID) 175 MCG tablet Take 175 mcg by mouth See admin instructions. Alternates taking 150 mg daily for 2 days then 175 mcg the third day then repeat    ? metFORMIN (GLUCOPHAGE) 500 MG tablet Take 500 mg by mouth daily.    ? MITIGARE 0.6 MG CAPS Take 1 capsule by mouth as needed (gout attacks).    ? mometasone (ELOCON) 0.1 % ointment Apply 1 application topically daily.    ? naproxen sodium (ANAPROX) 220 MG tablet Take 220 mg by mouth daily as needed (for pain.).     ? nitroGLYCERIN (NITROSTAT) 0.4 MG SL tablet DISSOLVE 1 TABLET UNDER THE TONGUE EVERY 5 MINUTES AS NEEDED FOR CHEST PAIN. DO NOT EXCEED A TOTAL OF 3 DOSES IN 15 MINUTES. (Patient taking differently: Place  0.4 mg under the tongue every 5 (five) minutes as needed for chest pain.) 25 tablet 3  ? prednisoLONE acetate (PRED FORTE) 1 % ophthalmic suspension Place 1 drop into the left eye daily.    ? RESTASIS 0.05 % ophthalmic emulsion Place 1 drop into both eyes 2 (two) times daily.    ? tamsulosin (FLOMAX) 0.4 MG CAPS capsule Take 0.8 mg by mouth daily.    ? telmisartan (MICARDIS) 80 MG tablet TAKE 1 TABLET BY MOUTH EVERY DAY 90 tablet 0  ? Loteprednol Etabonate (LOTEMAX SM) 0.38 % GEL Place 1 drop into the left eye 4 (four) times daily. (Patient not taking: Reported on 02/27/2022) 5 g 2  ? ?No facility-administered medications prior to  visit.  ? ? ?PAST MEDICAL HISTORY: ?Past Medical History:  ?Diagnosis Date  ? Arthritis   ? CAD (coronary artery disease)   ? Branch vessel and moderate RCA disease 2006  ? Cancer [facility]) 1990  ? Melanoma Lower right Leg  ? Carotid artery disease ([place])   ? Essential hypertension   ? Glaucoma   ? Hyperlipidemia   ? Hypothyroidism   ? Lyme disease   ? PAD (peripheral artery disease) ([place])   ? Left common iliac stent 2004  ? Pneumonia   ? TIA (transient ischemic attack) 02/2022  ? Type 2 diabetes mellitus ([place])   ? Wears dentures   ? Wears glasses   ? ? ?PAST SURGICAL HISTORY: ?Past Surgical History:  ?Procedure Laterality Date  ? CATARACT EXTRACTION Bilateral   ? CATARACT EXTRACTION, BILATERAL    ? CERVICAL DISC SURGERY    ? x 1  ? COLONOSCOPY N/A 11/06/2016  ? Procedure: COLONOSCOPY;  Surgeon: Daneil Dolin, MD;  [facility];  Service: Endoscopy;  Laterality: N/A;  7:30 AM  ? COLONOSCOPY  2012  ? Dr. Anthony Sar: normal. reviewed reports, which states he has a history of polyps in remote past.   ? ELBOW SURGERY Left   ? EYE SURGERY Bilateral   ? Cat Sx  ? EYE SURGERY Left 01/14/2021  ? [place] - Dr. Jovita Kussmaul  ? IRIDOTOMY / IRIDECTOMY Left 01/14/2021  ? [place] - Dr. Jovita Kussmaul  ? JOINT REPLACEMENT    ? LUMBAR WOUND DEBRIDEMENT N/A 01/24/2019  ? Procedure: LUMBAR WOUND Exploration for Evacation of Seroma vs. Hematoma;  Surgeon: Jovita Gamma, MD;  Location: [place];  Service: Neurosurgery;  Laterality: N/A;  ? MELANOMA EXCISION    ? right leg, seen at [facility] and underwent immunotherapy  ? NECK SURGERY    ?  x 1 yrs ago  ? PARS PLANA VITRECTOMY Left 04/04/2020  ? Procedure: PARS PLANA VITRECTOMY WITH 25G REMOVAL/SUTURE INTRAOCULAR LENS;  Surgeon: [name], MD;  Location: [place];  Service: Ophthalmology;  Laterality: Left;  ? REPLACEMENT TOTAL KNEE Right   ? WISDOM TOOTH EXTRACTION    ? ? ?FAMILY HISTORY: ?Family History  ?Problem Relation Age of Onset  ? Heart disease Mother   ? Heart attack Mother   ? Colon  polyps Mother   ? Colon polyps Brother   ? Heart attack Maternal Grandmother   ? Heart attack Maternal Grandfather   ? Colon cancer Neg Hx   ? ? ?SOCIAL HISTORY: ?Social History  ? ?Socioeconomic History  ? Marital status: Married  ?  Spouse name: Detta  ? Number of children: Not on file  ? Years of education: Not on file  ? Highest education level: Not on file  ?Occupational History  ?  Not on file  ?Tobacco Use  ? Smoking status: Former  ?  Packs/day: 1.50  ?  Years: 40.00  ?  Pack years: 60.00  ?  Types: Cigarettes  ?  Start date: 11/04/1963  ?  Quit date: 10/29/2003  ?  Years since quitting: 18.3  ? Smokeless tobacco: Never  ?Vaping Use  ? Vaping Use: Never used  ?Substance and Sexual Activity  ? Alcohol use: Yes  ?  Alcohol/week: 7.0 standard drinks  ?  Types: 7 Standard drinks or equivalent per week  ?  Comment: one miller lite daily   ? Drug use: No  ? Sexual activity: Not on file  ?Other Topics Concern  ? Not on file  ?Social History Narrative  ? Lives with wife  ? ?Social Determinants of Health  ? ?Financial Resource Strain: Not on file  ?Food Insecurity: Not on file  ?Transportation Needs: Not on file  ?Physical Activity: Not on file  ?Stress: Not on file  ?Social Connections: Not on file  ?Intimate Partner Violence: Not on file  ? ? ? ?PHYSICAL EXAM ? ?GENERAL EXAM/CONSTITUTIONAL: ?Vitals:  ?Vitals:  ? 03/11/22 1138  ?BP: 133/70  ?Pulse: 86  ?Weight: 187 lb (84.8 kg)  ?Height: '5\' 8"'$  (1.727 m)  ? ?Body mass index is 28.43 kg/m?. ?Wt Readings from Last 3 Encounters:  ?03/11/22 187 lb (84.8 kg)  ?02/27/22 184 lb 9.6 oz (83.7 kg)  ?10/21/21 181 lb (82.1 kg)  ? ?Patient is in no distress; well developed, nourished and groomed; neck is supple ? ?CARDIOVASCULAR: ?Examination of carotid arteries is normal; RIGHT CAROTID BRUIT ?Regular rate and rhythm, no murmurs ?Examination of peripheral vascular system by observation and palpation is normal ? ?EYES: ?Ophthalmoscopic exam of optic discs and posterior segments is  normal; no papilledema or hemorrhages ?No results found. ? ?MUSCULOSKELETAL: ?Gait, strength, tone, movements noted in Neurologic exam below ? ?NEUROLOGIC: ?MENTAL STATUS:  ?No flowsheet data found. ?awake, alert

## 2022-03-11 NOTE — Patient Instructions (Signed)
Right amaurosis fugax (likely symptomatic right ICA stenosis) ?- refer to urgent to vascular surgery for right CEA revascularization options ?- continue plavix, statin, BP control ?

## 2022-03-12 NOTE — Progress Notes (Signed)
? ? ?Cardiology Office Note ? ?Date: 03/13/2022  ? ?ID: Christopher Randolph, DOB 12/28/1946, MRN [id number] ? ?PCP:  Curlene Labrum, MD  ?Cardiologist:  Rozann Lesches, MD ?Electrophysiologist:  None  ? ?Chief Complaint  ?Patient presents with  ? Cardiac follow-up  ? ? ?History of Present Illness: ?Christopher Randolph is a 76 y.o. male last seen in October 2022.  He is here for a follow-up visit. Records indicate hospitalization earlier this month, seen in consultation by Dr. Harl Bowie for chest pain but no clear evidence of ACS.  He did have other symptoms evaluated by the hospitalist team including transient vision loss which was suspected to be possible TIA.  No clear evidence of stroke by MRI.  He was switched from aspirin to Plavix.  Description of event is consistent with amaurosis fugax on the right.  He did see neurology in follow-up and has been referred for vascular surgery evaluation in light of right sided ICA stenosis greater than 70% by CTA. ? ?He does not report any active chest pain at this time, tolerating his current medications which were adjusted during hospital stay.  Blood pressure is well controlled today.  He has not had to take any nitroglycerin. ? ?Recent follow-up echocardiogram revealed LVEF 60 to 65% with moderate diastolic dysfunction. ? ?Past Medical History:  ?Diagnosis Date  ? Arthritis   ? CAD (coronary artery disease)   ? Branch vessel and moderate RCA disease 2006  ? Cancer [facility]) 1990  ? Melanoma Lower right Leg  ? Carotid artery disease ([place])   ? Essential hypertension   ? Glaucoma   ? Hyperlipidemia   ? Hypothyroidism   ? Lyme disease   ? PAD (peripheral artery disease) ([place])   ? Left common iliac stent 2004  ? Pneumonia   ? TIA (transient ischemic attack) 02/2022  ? Type 2 diabetes mellitus ([place])   ? Wears dentures   ? Wears glasses   ? ? ?Past Surgical History:  ?Procedure Laterality Date  ? CATARACT EXTRACTION Bilateral   ? CATARACT EXTRACTION, BILATERAL    ? CERVICAL DISC SURGERY     ? x 1  ? COLONOSCOPY N/A 11/06/2016  ? Procedure: COLONOSCOPY;  Surgeon: Daneil Dolin, MD;  [facility];  Service: Endoscopy;  Laterality: N/A;  7:30 AM  ? COLONOSCOPY  2012  ? Dr. Anthony Sar: normal. reviewed reports, which states he has a history of polyps in remote past.   ? ELBOW SURGERY Left   ? EYE SURGERY Bilateral   ? Cat Sx  ? EYE SURGERY Left 01/14/2021  ? [place] - Dr. Jovita Kussmaul  ? IRIDOTOMY / IRIDECTOMY Left 01/14/2021  ? [facility] - Dr. Jovita Kussmaul  ? JOINT REPLACEMENT    ? LUMBAR WOUND DEBRIDEMENT N/A 01/24/2019  ? Procedure: LUMBAR WOUND Exploration for Evacation of Seroma vs. Hematoma;  Surgeon: Jovita Gamma, MD;  Location: [place];  Service: Neurosurgery;  Laterality: N/A;  ? MELANOMA EXCISION    ? right leg, seen at [facility] and underwent immunotherapy  ? NECK SURGERY    ?  x 1 yrs ago  ? PARS PLANA VITRECTOMY Left 04/04/2020  ? Procedure: PARS PLANA VITRECTOMY WITH 25G REMOVAL/SUTURE INTRAOCULAR LENS;  Surgeon: [name], MD;  Location: [place];  Service: Ophthalmology;  Laterality: Left;  ? REPLACEMENT TOTAL KNEE Right   ? WISDOM TOOTH EXTRACTION    ? ? ?Current Outpatient Medications  ?Medication Sig Dispense Refill  ? amLODipine (NORVASC) 10 MG tablet Take  1 tablet (10 mg total) by mouth daily. 30 tablet 1  ? atorvastatin (LIPITOR) 20 MG tablet TAKE 1 TABLET BY MOUTH DAILY (Patient taking differently: Take 20 mg by mouth daily.) 90 tablet 1  ? Bromfenac Sodium (PROLENSA) 0.07 % SOLN Apply 1 drop to eye in the morning, at noon, in the evening, and at bedtime. 3 mL 4  ? clopidogrel (PLAVIX) 75 MG tablet Take 1 tablet (75 mg total) by mouth daily. 30 tablet 11  ? cyclobenzaprine (FLEXERIL) 10 MG tablet Take 10 mg by mouth every 8 (eight) hours as needed for muscle spasms.    ? fluticasone (FLONASE) 50 MCG/ACT nasal spray Place 2 sprays into both nostrils daily.    ? latanoprost (XALATAN) 0.005 % ophthalmic solution Place 1 drop into the left eye at bedtime.    ? levothyroxine (SYNTHROID,  LEVOTHROID) 150 MCG tablet Take 150 mcg by mouth See admin instructions. Alternates taking 150 mg daily for 2 days then 175 mcg the third day then repeat  1  ? levothyroxine (SYNTHROID, LEVOTHROID) 175 MCG tablet Take 175 mcg by mouth See admin instructions. Alternates taking 150 mg daily for 2 days then 175 mcg the third day then repeat    ? metFORMIN (GLUCOPHAGE) 500 MG tablet Take 500 mg by mouth daily.    ? MITIGARE 0.6 MG CAPS Take 1 capsule by mouth as needed (gout attacks).    ? mometasone (ELOCON) 0.1 % ointment Apply 1 application topically daily.    ? naproxen sodium (ANAPROX) 220 MG tablet Take 220 mg by mouth daily as needed (for pain.).     ? nitroGLYCERIN (NITROSTAT) 0.4 MG SL tablet DISSOLVE 1 TABLET UNDER THE TONGUE EVERY 5 MINUTES AS NEEDED FOR CHEST PAIN. DO NOT EXCEED A TOTAL OF 3 DOSES IN 15 MINUTES. (Patient taking differently: Place 0.4 mg under the tongue every 5 (five) minutes as needed for chest pain.) 25 tablet 3  ? prednisoLONE acetate (PRED FORTE) 1 % ophthalmic suspension Place 1 drop into the left eye daily.    ? RESTASIS 0.05 % ophthalmic emulsion Place 1 drop into both eyes 2 (two) times daily.    ? tamsulosin (FLOMAX) 0.4 MG CAPS capsule Take 0.8 mg by mouth daily.    ? telmisartan (MICARDIS) 80 MG tablet TAKE 1 TABLET BY MOUTH EVERY DAY 90 tablet 0  ? ?No current facility-administered medications for this visit.  ? ?Allergies:  Erythromycin, Shellfish allergy, and Azithromycin  ? ?ROS: No syncope. ? ?Physical Exam: ?VS:  BP 124/70   Pulse 82   Ht '5\' 8"'$  (1.727 m)   Wt 184 lb (83.5 kg)   SpO2 94%   BMI 27.98 kg/m? , BMI Body mass index is 27.98 kg/m?. ? ?Wt Readings from Last 3 Encounters:  ?03/13/22 184 lb (83.5 kg)  ?03/11/22 187 lb (84.8 kg)  ?02/27/22 184 lb 9.6 oz (83.7 kg)  ?  ?General: Patient appears comfortable at rest. ?HEENT: Conjunctiva and lids normal, wearing a mask. ?Neck: Supple, no elevated JVP, right carotid bruit, no thyromegaly. ?Lungs: Clear to auscultation,  nonlabored breathing at rest. ?Cardiac: Regular rate and rhythm, no S3 or significant systolic murmur, no pericardial rub. ?Extremities: No pitting edema. ? ?ECG:  An ECG dated 02/27/2022 was personally reviewed today and demonstrated:  Sinus bradycardia with nonspecific T wave changes. ? ?Recent Labwork: ?02/28/2022: ALT 31; AST 24; BUN 23; Creatinine, Ser 0.85; Hemoglobin 13.3; Platelets 128; Potassium 3.3; Sodium 139  ?   ?Component Value Date/Time  ?  CHOL 111 02/28/2022 0525  ? TRIG 163 (H) 02/28/2022 0525  ? HDL 33 (L) 02/28/2022 0525  ? CHOLHDL 3.4 02/28/2022 0525  ? VLDL 33 02/28/2022 0525  ? [place] 45 02/28/2022 0525  ? ? ?Other Studies Reviewed Today: ? ?Carotid Dopplers 10/30/2021: ?Summary:  ?Right Carotid: Velocities in the right ICA are consistent with a 60-79%  ?               stenosis. Stable.  ? ?Left Carotid: Velocities in the left ICA are consistent with a 40-59%  ?stenosis.  ?              Stable.  ? ?Vertebrals:  Bilateral vertebral arteries demonstrate antegrade flow.  ?Subclavians: Normal flow hemodynamics were seen in bilateral subclavian  ?             arteries.  ?  ?Lexiscan Myoview 05/09/2021 [facility]): ?1. No reversible ischemia. Potential scar in the basilar inferior  ?wall..  ? ?2. Normal left ventricular wall motion.  ? ?3. Left ventricular ejection fraction 67%  ? ?4. Non invasive risk stratification*: Low  ? ?CTA head neck 02/27/2022: ?IMPRESSION: ?No acute intracranial hemorrhage. Age-indeterminate small infarct of ?the right postcentral gyrus. 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 ?  ?No large vessel occlusion. Plaque at the ICA origins causes at least ?70% stenosis. ? ?Echocardiogram 02/28/2022: ? 1. Left ventricular ejection fraction, by estimation, is 60 to 65%. The  ?left ventricle has normal function. The left ventricle has no regional  ?wall motion abnormalities. Left ventricular diastolic parameters are  ?consistent with Grade II diastolic  ?dysfunction (pseudonormalization).  Elevated left atrial pressure.  ? 2. Right ventricular systolic function is normal. The right ventricular  ?size is normal. There is normal pulmonary artery systolic pressure. The  ?estimated right ventricula

## 2022-03-13 ENCOUNTER — Encounter: Payer: Self-pay | Admitting: Cardiology

## 2022-03-13 ENCOUNTER — Ambulatory Visit (INDEPENDENT_AMBULATORY_CARE_PROVIDER_SITE_OTHER): Payer: Medicare Other | Admitting: Cardiology

## 2022-03-13 VITALS — BP 124/70 | HR 82 | Ht 68.0 in | Wt 184.0 lb

## 2022-03-13 DIAGNOSIS — E782 Mixed hyperlipidemia: Secondary | ICD-10-CM

## 2022-03-13 DIAGNOSIS — I6523 Occlusion and stenosis of bilateral carotid arteries: Secondary | ICD-10-CM | POA: Diagnosis not present

## 2022-03-13 DIAGNOSIS — I25119 Atherosclerotic heart disease of native coronary artery with unspecified angina pectoris: Secondary | ICD-10-CM

## 2022-03-13 NOTE — Patient Instructions (Addendum)

## 2022-03-16 NOTE — Progress Notes (Signed)
This encounter was created in error - please disregard.  This encounter was created in error - please disregard.  This encounter was created in error - please disregard.  This encounter was created in error - please disregard.  This encounter was created in error - please disregard.  This encounter was created in error - please disregard.

## 2022-03-18 ENCOUNTER — Encounter: Payer: Self-pay | Admitting: Vascular Surgery

## 2022-03-18 ENCOUNTER — Ambulatory Visit: Payer: Medicare Other | Admitting: Vascular Surgery

## 2022-03-18 ENCOUNTER — Other Ambulatory Visit: Payer: Self-pay

## 2022-03-18 VITALS — BP 126/68 | HR 62 | Temp 99.1°F | Resp 16 | Ht 68.0 in | Wt 186.6 lb

## 2022-03-18 DIAGNOSIS — I6521 Occlusion and stenosis of right carotid artery: Secondary | ICD-10-CM

## 2022-03-18 NOTE — H&P (View-Only) (Signed)
? ? ?[facility] [place] ? ?Patient name: Christopher Randolph MRN: [id number] DOB: [birth year] Sex: male ? ?REASON FOR CONSULT: Evaluation of symptomatic carotid disease ? ?HPI: ?LACHARLES ALTSCHULER is a 76 y.o. male, who is here today for evaluation of symptomatic carotid disease.  He was in his usual state of health when he had episode of initial vision changes and then complete loss of vision in his right eye on 02/26/22.  He mentioned this to his daughter the following day who is a Librarian, academic in [place].  She instructed him to go immediately to the emergency department.   He presented to the emergency department the following day and underwent evaluation which included a CT angiogram.  I have reviewed this with the patient and this reveals severe calcification and stenosis in his right internal carotid artery.  He is seen today for further discussion of this.  His work-up otherwise revealed no evidence of stroke.  He underwent 2D echocardiogram showing no evidence of cardiogenic embolic source.  He has no prior history of amaurosis fugax, transient ischemic attack or stroke.  He is right-handed.  Does have a history of coronary disease.  He was seen in consultation with Dr. 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 ? ?Past Medical History:  ?Diagnosis Date  ? Arthritis   ? CAD (coronary artery disease)   ? Branch vessel and moderate RCA disease 2006  ? Cancer [facility]) 1990  ? Melanoma Lower right Leg  ? Carotid artery disease ([place])   ? Essential hypertension   ? Glaucoma   ? Hyperlipidemia   ? Hypothyroidism   ? Lyme disease   ? PAD (peripheral artery disease) ([place])   ? Left common iliac stent 2004  ? Pneumonia   ? TIA (transient ischemic attack) 02/2022  ? Type 2 diabetes mellitus ([place])   ? Wears dentures   ? Wears glasses   ? ? ?Family History  ?Problem Relation Age of Onset  ? Heart  disease Mother   ? Heart attack Mother   ? Colon polyps Mother   ? Colon polyps Brother   ? Heart attack Maternal Grandmother   ? Heart attack Maternal Grandfather   ? Colon cancer Neg Hx   ? ? ?SOCIAL HISTORY: ?Social History  ? ?Socioeconomic History  ? Marital status: Married  ?  Spouse name: Detta  ? Number of children: Not on file  ? Years of education: Not on file  ? Highest education level: Not on file  ?Occupational History  ? Not on file  ?Tobacco Use  ? Smoking status: Former  ?  Packs/day: 1.50  ?  Years: 40.00  ?  Pack years: 60.00  ?  Types: Cigarettes  ?  Start date: 11/04/1963  ?  Quit date: 10/29/2003  ?  Years since quitting: 18.3  ? Smokeless tobacco: Never  ?Vaping Use  ? Vaping Use: Never used  ?Substance and Sexual Activity  ? Alcohol use: Yes  ?  Alcohol/week: 7.0 standard drinks  ?  Types: 7 Standard drinks or equivalent per week  ?  Comment: one miller lite daily   ? Drug use: No  ? Sexual activity: Not on file  ?Other Topics Concern  ? Not on file  ?Social History Narrative  ? Lives with wife  ? ?Social Determinants of Health  ? ?Financial Resource Strain: Not on file  ?Food Insecurity: Not on  file  ?Transportation Needs: Not on file  ?Physical Activity: Not on file  ?Stress: Not on file  ?Social Connections: Not on file  ?Intimate Partner Violence: Not on file  ? ? ?Allergies  ?Allergen Reactions  ? Erythromycin Swelling  ?  swelling of lips ?swelling of lips ?swelling of lips ?swelling of lips ?  ? Shellfish Allergy Other (See Comments) and Rash  ?  Gout flares ?Gout flares ?Gout flares ?Gout flares  ? Azithromycin Swelling  ? ? ?Current Outpatient Medications  ?Medication Sig Dispense Refill  ? amLODipine (NORVASC) 10 MG tablet Take 1 tablet (10 mg total) by mouth daily. 30 tablet 1  ? atorvastatin (LIPITOR) 20 MG tablet TAKE 1 TABLET BY MOUTH DAILY (Patient taking differently: Take 20 mg by mouth daily.) 90 tablet 1  ? Bromfenac Sodium (PROLENSA) 0.07 % SOLN Apply 1 drop to eye in the  morning, at noon, in the evening, and at bedtime. 3 mL 4  ? clopidogrel (PLAVIX) 75 MG tablet Take 1 tablet (75 mg total) by mouth daily. 30 tablet 11  ? cyclobenzaprine (FLEXERIL) 10 MG tablet Take 10 mg by mouth every 8 (eight) hours as needed for muscle spasms.    ? fluticasone (FLONASE) 50 MCG/ACT nasal spray Place 2 sprays into both nostrils daily.    ? latanoprost (XALATAN) 0.005 % ophthalmic solution Place 1 drop into the left eye at bedtime.    ? levothyroxine (SYNTHROID, LEVOTHROID) 150 MCG tablet Take 150 mcg by mouth See admin instructions. Alternates taking 150 mg daily for 2 days then 175 mcg the third day then repeat  1  ? levothyroxine (SYNTHROID, LEVOTHROID) 175 MCG tablet Take 175 mcg by mouth See admin instructions. Alternates taking 150 mg daily for 2 days then 175 mcg the third day then repeat    ? metFORMIN (GLUCOPHAGE) 500 MG tablet Take 500 mg by mouth daily.    ? MITIGARE 0.6 MG CAPS Take 1 capsule by mouth as needed (gout attacks).    ? mometasone (ELOCON) 0.1 % ointment Apply 1 application topically daily.    ? naproxen sodium (ANAPROX) 220 MG tablet Take 220 mg by mouth daily as needed (for pain.).     ? nitroGLYCERIN (NITROSTAT) 0.4 MG SL tablet DISSOLVE 1 TABLET UNDER THE TONGUE EVERY 5 MINUTES AS NEEDED FOR CHEST PAIN. DO NOT EXCEED A TOTAL OF 3 DOSES IN 15 MINUTES. (Patient taking differently: Place 0.4 mg under the tongue every 5 (five) minutes as needed for chest pain.) 25 tablet 3  ? prednisoLONE acetate (PRED FORTE) 1 % ophthalmic suspension Place 1 drop into the left eye daily.    ? RESTASIS 0.05 % ophthalmic emulsion Place 1 drop into both eyes 2 (two) times daily.    ? tamsulosin (FLOMAX) 0.4 MG CAPS capsule Take 0.8 mg by mouth daily.    ? telmisartan (MICARDIS) 80 MG tablet TAKE 1 TABLET BY MOUTH EVERY DAY 90 tablet 0  ? ?No current facility-administered medications for this visit.  ? ? ?REVIEW OF SYSTEMS:  ?'[X]'$  denotes positive finding, '[ ]'$  denotes negative finding ?Cardiac   Comments:  ?Chest pain or chest pressure: x   ?Shortness of breath upon exertion: x   ?Short of breath when lying flat:    ?Irregular heart rhythm:    ?    ?Vascular    ?Pain in calf, thigh, or hip brought on by ambulation: x   ?Pain in feet at night that wakes you up from your sleep:     ?  Blood clot in your veins:    ?Leg swelling:     ?    ?Pulmonary    ?Oxygen at home:    ?Productive cough:     ?Wheezing:     ?    ?Neurologic    ?Sudden weakness in arms or legs:     ?Sudden numbness in arms or legs:     ?Sudden onset of difficulty speaking or slurred speech:    ?Temporary loss of vision in one eye:  x   ?Problems with dizziness:  x   ?    ?Gastrointestinal    ?Blood in stool:     ?Vomited blood:     ?    ?Genitourinary    ?Burning when urinating:     ?Blood in urine:    ?    ?Psychiatric    ?Major depression:     ?    ?Hematologic    ?Bleeding problems:    ?Problems with blood clotting too easily:    ?    ?Skin    ?Rashes or ulcers:    ?    ?Constitutional    ?Fever or chills:    ? ? ?PHYSICAL EXAM: ?Vitals:  ? 03/18/22 1048 03/18/22 1049  ?BP: 121/71 126/68  ?Pulse: 62   ?Resp: 16   ?Temp: 99.1 ?F (37.3 ?C)   ?TempSrc: Temporal   ?SpO2: 93%   ?Weight: 186 lb 9.6 oz (84.6 kg)   ?Height: '5\' 8"'$  (1.727 m)   ? ? ?GENERAL: The patient is a well-nourished male, in no acute distress. The vital signs are documented above. ?CARDIOVASCULAR: 2+ radial pulses bilaterally.  Carotid arteries without bruits bilaterally. ?PULMONARY: There is good air exchange  ?MUSCULOSKELETAL: There are no major deformities or cyanosis. ?NEUROLOGIC: No focal weakness or paresthesias are detected. ?SKIN: There are no ulcers or rashes noted. ?PSYCHIATRIC: The patient has a normal affect. ? ?DATA:  ?Reviewed CT images with the patient.  This shows extreme calcification of his bifurcations bilaterally.  He does have severe stenosis in the right carotid bifurcation.  He does have some nonflow-limiting calcification at the cavernous portion of his  right internal carotid artery.  He does have extensive tortuosity in his distal internal carotid artery.  The plaque does extend above the bifurcation into the internal carotid artery ? ?MEDICAL ISSUES: ?Had l

## 2022-03-18 NOTE — Progress Notes (Signed)
? ? ?[facility] and [facility]in Specialist of [place] ? ?Patient name: Christopher Randolph MRN: [id number] DOB: June 11, 1946 Sex: male ? ?REASON FOR CONSULT: Evaluation of symptomatic carotid disease ? ?HPI: ?Christopher Randolph is a 76 y.o. male, who is here today for evaluation of symptomatic carotid disease.  He was in his usual state of health when he had episode of initial vision changes and then complete loss of vision in his right eye on 02/26/22.  He mentioned this to his daughter the following day who is a Librarian, academic in [place] [employer].  She instructed him to go immediately to the emergency department.   He presented to the emergency department the following day and underwent evaluation which included a CT angiogram.  I have reviewed this with the patient and this reveals severe calcification and stenosis in his right internal carotid artery.  He is seen today for further discussion of this.  His work-up otherwise revealed no evidence of stroke.  He underwent 2D echocardiogram showing no evidence of cardiogenic embolic source.  He has no prior history of amaurosis fugax, transient ischemic attack or stroke.  He is right-handed.  Does have a history of coronary disease.  He was seen in consultation with Dr. 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 ? ?Past Medical History:  ?Diagnosis Date  ? Arthritis   ? CAD (coronary artery disease)   ? Branch vessel and moderate RCA disease 2006  ? Cancer [facility]) 1990  ? Melanoma Lower right Leg  ? Carotid artery disease ([place])   ? Essential hypertension   ? Glaucoma   ? Hyperlipidemia   ? Hypothyroidism   ? Lyme disease   ? PAD (peripheral artery disease) ([place])   ? Left common iliac stent 2004  ? Pneumonia   ? TIA (transient ischemic attack) 02/2022  ? Type 2 diabetes mellitus ([place])   ? Wears dentures   ? Wears glasses   ? ? ?Family History  ?Problem Relation Age of Onset  ? Heart  disease Mother   ? Heart attack Mother   ? Colon polyps Mother   ? Colon polyps Brother   ? Heart attack Maternal Grandmother   ? Heart attack Maternal Grandfather   ? Colon cancer Neg Hx   ? ? ?SOCIAL HISTORY: ?Social History  ? ?Socioeconomic History  ? Marital status: Married  ?  Spouse name: Detta  ? Number of children: Not on file  ? Years of education: Not on file  ? Highest education level: Not on file  ?Occupational History  ? Not on file  ?Tobacco Use  ? Smoking status: Former  ?  Packs/day: 1.50  ?  Years: 40.00  ?  Pack years: 60.00  ?  Types: Cigarettes  ?  Start date: 11/04/1963  ?  Quit date: 10/29/2003  ?  Years since quitting: 18.3  ? Smokeless tobacco: Never  ?Vaping Use  ? Vaping Use: Never used  ?Substance and Sexual Activity  ? Alcohol use: Yes  ?  Alcohol/week: 7.0 standard drinks  ?  Types: 7 Standard drinks or equivalent per week  ?  Comment: one miller lite daily   ? Drug use: No  ? Sexual activity: Not on file  ?Other Topics Concern  ? Not on file  ?Social History Narrative  ? Lives with wife  ? ?Social Determinants of Health  ? ?Financial Resource Strain: Not on file  ?Food Insecurity: Not on  file  ?Transportation Needs: Not on file  ?Physical Activity: Not on file  ?Stress: Not on file  ?Social Connections: Not on file  ?Intimate Partner Violence: Not on file  ? ? ?Allergies  ?Allergen Reactions  ? Erythromycin Swelling  ?  swelling of lips ?swelling of lips ?swelling of lips ?swelling of lips ?  ? Shellfish Allergy Other (See Comments) and Rash  ?  Gout flares ?Gout flares ?Gout flares ?Gout flares  ? Azithromycin Swelling  ? ? ?Current Outpatient Medications  ?Medication Sig Dispense Refill  ? amLODipine (NORVASC) 10 MG tablet Take 1 tablet (10 mg total) by mouth daily. 30 tablet 1  ? atorvastatin (LIPITOR) 20 MG tablet TAKE 1 TABLET BY MOUTH DAILY (Patient taking differently: Take 20 mg by mouth daily.) 90 tablet 1  ? Bromfenac Sodium (PROLENSA) 0.07 % SOLN Apply 1 drop to eye in the  morning, at noon, in the evening, and at bedtime. 3 mL 4  ? clopidogrel (PLAVIX) 75 MG tablet Take 1 tablet (75 mg total) by mouth daily. 30 tablet 11  ? cyclobenzaprine (FLEXERIL) 10 MG tablet Take 10 mg by mouth every 8 (eight) hours as needed for muscle spasms.    ? fluticasone (FLONASE) 50 MCG/ACT nasal spray Place 2 sprays into both nostrils daily.    ? latanoprost (XALATAN) 0.005 % ophthalmic solution Place 1 drop into the left eye at bedtime.    ? levothyroxine (SYNTHROID, LEVOTHROID) 150 MCG tablet Take 150 mcg by mouth See admin instructions. Alternates taking 150 mg daily for 2 days then 175 mcg the third day then repeat  1  ? levothyroxine (SYNTHROID, LEVOTHROID) 175 MCG tablet Take 175 mcg by mouth See admin instructions. Alternates taking 150 mg daily for 2 days then 175 mcg the third day then repeat    ? metFORMIN (GLUCOPHAGE) 500 MG tablet Take 500 mg by mouth daily.    ? MITIGARE 0.6 MG CAPS Take 1 capsule by mouth as needed (gout attacks).    ? mometasone (ELOCON) 0.1 % ointment Apply 1 application topically daily.    ? naproxen sodium (ANAPROX) 220 MG tablet Take 220 mg by mouth daily as needed (for pain.).     ? nitroGLYCERIN (NITROSTAT) 0.4 MG SL tablet DISSOLVE 1 TABLET UNDER THE TONGUE EVERY 5 MINUTES AS NEEDED FOR CHEST PAIN. DO NOT EXCEED A TOTAL OF 3 DOSES IN 15 MINUTES. (Patient taking differently: Place 0.4 mg under the tongue every 5 (five) minutes as needed for chest pain.) 25 tablet 3  ? prednisoLONE acetate (PRED FORTE) 1 % ophthalmic suspension Place 1 drop into the left eye daily.    ? RESTASIS 0.05 % ophthalmic emulsion Place 1 drop into both eyes 2 (two) times daily.    ? tamsulosin (FLOMAX) 0.4 MG CAPS capsule Take 0.8 mg by mouth daily.    ? telmisartan (MICARDIS) 80 MG tablet TAKE 1 TABLET BY MOUTH EVERY DAY 90 tablet 0  ? ?No current facility-administered medications for this visit.  ? ? ?REVIEW OF SYSTEMS:  ?'[X]'$  denotes positive finding, '[ ]'$  denotes negative finding ?Cardiac   Comments:  ?Chest pain or chest pressure: x   ?Shortness of breath upon exertion: x   ?Short of breath when lying flat:    ?Irregular heart rhythm:    ?    ?Vascular    ?Pain in calf, thigh, or hip brought on by ambulation: x   ?Pain in feet at night that wakes you up from your sleep:     ?  Blood clot in your veins:    ?Leg swelling:     ?    ?Pulmonary    ?Oxygen at home:    ?Productive cough:     ?Wheezing:     ?    ?Neurologic    ?Sudden weakness in arms or legs:     ?Sudden numbness in arms or legs:     ?Sudden onset of difficulty speaking or slurred speech:    ?Temporary loss of vision in one eye:  x   ?Problems with dizziness:  x   ?    ?Gastrointestinal    ?Blood in stool:     ?Vomited blood:     ?    ?Genitourinary    ?Burning when urinating:     ?Blood in urine:    ?    ?Psychiatric    ?Major depression:     ?    ?Hematologic    ?Bleeding problems:    ?Problems with blood clotting too easily:    ?    ?Skin    ?Rashes or ulcers:    ?    ?Constitutional    ?Fever or chills:    ? ? ?PHYSICAL EXAM: ?Vitals:  ? 03/18/22 1048 03/18/22 1049  ?BP: 121/71 126/68  ?Pulse: 62   ?Resp: 16   ?Temp: 99.1 ?F (37.3 ?C)   ?TempSrc: Temporal   ?SpO2: 93%   ?Weight: 186 lb 9.6 oz (84.6 kg)   ?Height: '5\' 8"'$  (1.727 m)   ? ? ?GENERAL: The patient is a well-nourished male, in no acute distress. The vital signs are documented above. ?CARDIOVASCULAR: 2+ radial pulses bilaterally.  Carotid arteries without bruits bilaterally. ?PULMONARY: There is good air exchange  ?MUSCULOSKELETAL: There are no major deformities or cyanosis. ?NEUROLOGIC: No focal weakness or paresthesias are detected. ?SKIN: There are no ulcers or rashes noted. ?PSYCHIATRIC: The patient has a normal affect. ? ?DATA:  ?Reviewed CT images with the patient.  This shows extreme calcification of his bifurcations bilaterally.  He does have severe stenosis in the right carotid bifurcation.  He does have some nonflow-limiting calcification at the cavernous portion of his  right internal carotid artery.  He does have extensive tortuosity in his distal internal carotid artery.  The plaque does extend above the bifurcation into the internal carotid artery ? ?MEDICAL ISSUES: ?Had l

## 2022-03-19 ENCOUNTER — Other Ambulatory Visit: Payer: Self-pay

## 2022-03-19 DIAGNOSIS — I6521 Occlusion and stenosis of right carotid artery: Secondary | ICD-10-CM

## 2022-03-23 ENCOUNTER — Encounter (HOSPITAL_COMMUNITY): Payer: Self-pay | Admitting: Surgery

## 2022-03-23 ENCOUNTER — Other Ambulatory Visit: Payer: Self-pay

## 2022-03-23 NOTE — Progress Notes (Signed)
Mr. Christopher Randolph denies chest pain or shortness of breath. Patient denies having any s/s of Covid in his household.  Patient denies any known exposure to Covid.  ? ?Mr. Christopher Randolph Drs: ?PCP : Dr. Judd Lien ?Cardiologist : Dr. Rozann Lesches ?Neurologist : Dr. Andrey Spearman. ? ?Mr. Christopher Randolph had a TIA 02/27/22.  ? ?Mr. Christopher Randolph has type II diabetes. Last A1C was 6.1 om 02/28/22.  Mr. Christopher Randolph reports that CBGs run 120-130.  I instructed Mr. Christopher Randolph to not take Metformin on Wednesday am. I instructed patient to check CBG after awaking and every 2 hours until arrival  to the hospital.  ?I Instructed patient if CBG is less than 70 to take 4 Glucose Tablets or 1 tube of Glucose Gel or 1/2 cup of a clear juice. Recheck CBG in 15 minutes if CBG is not over 70 call, pre- op desk at [phone] for further instructions.  ? ? ?I instructed Mr. Christopher Randolph to shower with antibacteria soap. No nail polish, artificial or acrylic nails. Wear clean clothes, brush your teeth. ?Glasses, contact lens,dentures or partials may not be worn in the OR. If you need to wear them, please bring a case for glasses, do not wear contacts or bring a case, the hospital does not have contact cases, dentures or partials will have to be removed , make sure they are clean, we will provide a denture cup to put them in. You will need some one to drive you home and a responsible person over the age of 25 to stay with you for the first 24 hours after surgery.  ? ? ? ? ? ?

## 2022-03-24 NOTE — Anesthesia Preprocedure Evaluation (Addendum)
Anesthesia Evaluation  ?Patient identified by MRN, date of birth, ID band ?Patient awake ? ? ? ?Reviewed: ?Allergy & Precautions, NPO status , Patient's Chart, lab work & pertinent test results, reviewed documented beta blocker date and time  ? ?History of Anesthesia Complications ?(+) PONV and history of anesthetic complications ? ?Airway ?Mallampati: II ? ?TM Distance: >3 FB ?Neck ROM: Full ? ? ? Dental ? ?(+) Dental Advisory Given, Edentulous Upper, Partial Lower, Poor Dentition ?  ?Pulmonary ?COPD, former smoker,  ?  ?Pulmonary exam normal ?breath sounds clear to auscultation ? ? ? ? ? ? Cardiovascular ?hypertension, Pt. on medications ?+ CAD and + Peripheral Vascular Disease  ?+ Valvular Problems/Murmurs  ?Rhythm:Regular Rate:Normal ?+ Systolic murmurs ?Echo 02/2022 ??1. Left ventricular ejection fraction, by estimation, is 60 to 65%. The left ventricle has normal function. The left ventricle has no regional wall motion abnormalities. Left ventricular diastolic parameters are consistent with Grade II diastolic dysfunction (pseudonormalization). Elevated left atrial pressure.  ??2. Right ventricular systolic function is normal. The right ventricular size is normal. There is normal pulmonary artery systolic pressure. The estimated right ventricular systolic pressure is 78.6 mmHg.  ??3. Left atrial size was mildly dilated.  ??4. The mitral valve is normal in structure. No evidence of mitral valve regurgitation. No evidence of mitral stenosis.  ??5. The aortic valve is tricuspid. There is mild calcification of the aortic valve. Aortic valve regurgitation is not visualized. Aortic valve sclerosis is present, with no evidence of aortic valve stenosis.  ??6. The inferior vena cava is normal in size with greater than 50% respiratory variability, suggesting right atrial pressure of 3 mmHg.  ?  ?Neuro/Psych ?TIAnegative psych ROS  ? GI/Hepatic ?Neg liver ROS,   ?Endo/Other   ?diabetesHypothyroidism  ? Renal/GU ?  ? ?  ?Musculoskeletal ? ?(+) Arthritis ,  ? Abdominal ?  ?Peds ? Hematology ?  ?Anesthesia Other Findings ? ? Reproductive/Obstetrics ? ?  ? ? ? ? ? ? ? ? ? ? ? ? ? ?  ?  ? ? ? ? ? ? ? ?Anesthesia Physical ? ?Anesthesia Plan ? ?ASA: 3 ? ?Anesthesia Plan: General  ? ?Post-op Pain Management: Minimal or no pain anticipated  ? ?Induction: Intravenous ? ?PONV Risk Score and Plan: 3 and Treatment may vary due to age or medical condition, Ondansetron and Dexamethasone ? ?Airway Management Planned: Oral ETT ? ?Additional Equipment: Arterial line ? ?Intra-op Plan:  ? ?Post-operative Plan: Extubation in OR ? ?Informed Consent: I have reviewed the patients History and Physical, chart, labs and discussed the procedure including the risks, benefits and alternatives for the proposed anesthesia with the patient or authorized representative who has indicated his/her understanding and acceptance.  ? ? ? ?Dental advisory given ? ?Plan Discussed with: CRNA ? ?Anesthesia Plan Comments: (Remi gtt)  ? ? ? ? ? ?Anesthesia Quick Evaluation ? ?

## 2022-03-25 ENCOUNTER — Inpatient Hospital Stay (HOSPITAL_COMMUNITY): Payer: Medicare Other | Admitting: Anesthesiology

## 2022-03-25 ENCOUNTER — Encounter (HOSPITAL_COMMUNITY): Payer: Self-pay | Admitting: Surgery

## 2022-03-25 ENCOUNTER — Other Ambulatory Visit: Payer: Self-pay

## 2022-03-25 ENCOUNTER — Inpatient Hospital Stay (HOSPITAL_COMMUNITY): Payer: Medicare Other

## 2022-03-25 ENCOUNTER — Encounter (HOSPITAL_COMMUNITY)
Admission: RE | Disposition: A | Discharge status: Home / Self Care | Payer: Self-pay | Source: Home / Self Care | Attending: Surgery

## 2022-03-25 ENCOUNTER — Inpatient Hospital Stay (HOSPITAL_COMMUNITY)
Admission: RE | Admit: 2022-03-25 | Discharge: 2022-03-27 | DRG: 036 | Disposition: A | Discharge status: Home / Self Care | Payer: Medicare Other | Attending: Surgery | Admitting: Surgery

## 2022-03-25 DIAGNOSIS — E785 Hyperlipidemia, unspecified: Secondary | ICD-10-CM | POA: Diagnosis present

## 2022-03-25 DIAGNOSIS — Z7984 Long term (current) use of oral hypoglycemic drugs: Secondary | ICD-10-CM

## 2022-03-25 DIAGNOSIS — J449 Chronic obstructive pulmonary disease, unspecified: Secondary | ICD-10-CM | POA: Diagnosis present

## 2022-03-25 DIAGNOSIS — E1151 Type 2 diabetes mellitus with diabetic peripheral angiopathy without gangrene: Secondary | ICD-10-CM | POA: Diagnosis not present

## 2022-03-25 DIAGNOSIS — H409 Unspecified glaucoma: Secondary | ICD-10-CM | POA: Diagnosis present

## 2022-03-25 DIAGNOSIS — H5440 Blindness, one eye, unspecified eye: Secondary | ICD-10-CM

## 2022-03-25 DIAGNOSIS — Z79899 Other long term (current) drug therapy: Secondary | ICD-10-CM

## 2022-03-25 DIAGNOSIS — Z79890 Hormone replacement therapy (postmenopausal): Secondary | ICD-10-CM

## 2022-03-25 DIAGNOSIS — Z85820 Personal history of malignant melanoma of skin: Secondary | ICD-10-CM | POA: Diagnosis not present

## 2022-03-25 DIAGNOSIS — E039 Hypothyroidism, unspecified: Secondary | ICD-10-CM | POA: Diagnosis present

## 2022-03-25 DIAGNOSIS — Z881 Allergy status to other antibiotic agents status: Secondary | ICD-10-CM | POA: Diagnosis not present

## 2022-03-25 DIAGNOSIS — I10 Essential (primary) hypertension: Secondary | ICD-10-CM | POA: Diagnosis present

## 2022-03-25 DIAGNOSIS — Z006 Encounter for examination for normal comparison and control in clinical research program: Secondary | ICD-10-CM

## 2022-03-25 DIAGNOSIS — I6529 Occlusion and stenosis of unspecified carotid artery: Secondary | ICD-10-CM | POA: Diagnosis present

## 2022-03-25 DIAGNOSIS — Z91013 Allergy to seafood: Secondary | ICD-10-CM

## 2022-03-25 DIAGNOSIS — Z87891 Personal history of nicotine dependence: Secondary | ICD-10-CM | POA: Diagnosis not present

## 2022-03-25 DIAGNOSIS — Z8673 Personal history of transient ischemic attack (TIA), and cerebral infarction without residual deficits: Secondary | ICD-10-CM | POA: Diagnosis not present

## 2022-03-25 DIAGNOSIS — I2510 Atherosclerotic heart disease of native coronary artery without angina pectoris: Secondary | ICD-10-CM | POA: Diagnosis not present

## 2022-03-25 DIAGNOSIS — Z8249 Family history of ischemic heart disease and other diseases of the circulatory system: Secondary | ICD-10-CM | POA: Diagnosis not present

## 2022-03-25 DIAGNOSIS — I6521 Occlusion and stenosis of right carotid artery: Secondary | ICD-10-CM | POA: Diagnosis not present

## 2022-03-25 DIAGNOSIS — Z7902 Long term (current) use of antithrombotics/antiplatelets: Secondary | ICD-10-CM | POA: Diagnosis not present

## 2022-03-25 HISTORY — DX: Chronic obstructive pulmonary disease, unspecified: J44.9

## 2022-03-25 HISTORY — PX: TRANSCAROTID ARTERY REVASCULARIZATIONÂ: SHX6778

## 2022-03-25 LAB — URINALYSIS, ROUTINE W REFLEX MICROSCOPIC
Bilirubin Urine: NEGATIVE
Glucose, UA: NEGATIVE mg/dL
Hgb urine dipstick: NEGATIVE
Ketones, ur: NEGATIVE mg/dL
Leukocytes,Ua: NEGATIVE
Nitrite: NEGATIVE
Protein, ur: NEGATIVE mg/dL
Specific Gravity, Urine: 1.023 (ref 1.005–1.030)
pH: 5 (ref 5.0–8.0)

## 2022-03-25 LAB — COMPREHENSIVE METABOLIC PANEL
ALT: 27 U/L (ref 0–44)
AST: 21 U/L (ref 15–41)
Albumin: 3.7 g/dL (ref 3.5–5.0)
Alkaline Phosphatase: 49 U/L (ref 38–126)
Anion gap: 8 (ref 5–15)
BUN: 15 mg/dL (ref 8–23)
CO2: 23 mmol/L (ref 22–32)
Calcium: 8.6 mg/dL — ABNORMAL LOW (ref 8.9–10.3)
Chloride: 108 mmol/L (ref 98–111)
Creatinine, Ser: 0.83 mg/dL (ref 0.61–1.24)
GFR, Estimated: >60 mL/min (ref >60)
Glucose, Bld: 166 mg/dL — ABNORMAL HIGH (ref 70–99)
Potassium: 3.9 mmol/L (ref 3.5–5.1)
Sodium: 139 mmol/L (ref 135–145)
Total Bilirubin: 0.7 mg/dL (ref 0.3–1.2)
Total Protein: 6 g/dL — ABNORMAL LOW (ref 6.5–8.1)

## 2022-03-25 LAB — TYPE AND SCREEN
ABO/RH(D): O POS
Antibody Screen: NEGATIVE

## 2022-03-25 LAB — CBC
HCT: 39.6 % (ref 39.0–52.0)
HCT: 33.9 % — ABNORMAL LOW (ref 39.0–52.0)
Hemoglobin: 13.2 g/dL (ref 13.0–17.0)
Hemoglobin: 11.7 g/dL — ABNORMAL LOW (ref 13.0–17.0)
MCH: 30.1 pg (ref 26.0–34.0)
MCH: 30.8 pg (ref 26.0–34.0)
MCHC: 33.3 g/dL (ref 30.0–36.0)
MCHC: 34.5 g/dL (ref 30.0–36.0)
MCV: 90.4 fL (ref 80.0–100.0)
MCV: 89.2 fL (ref 80.0–100.0)
Platelets: 123 10*3/uL — ABNORMAL LOW (ref 150–400)
Platelets: 113 10*3/uL — ABNORMAL LOW (ref 150–400)
RBC: 4.38 MIL/uL (ref 4.22–5.81)
RBC: 3.8 MIL/uL — ABNORMAL LOW (ref 4.22–5.81)
RDW: 12.9 % (ref 11.5–15.5)
RDW: 12.9 % (ref 11.5–15.5)
WBC: 3.3 10*3/uL — ABNORMAL LOW (ref 4.0–10.5)
WBC: 5.1 10*3/uL (ref 4.0–10.5)
nRBC: 0 % (ref 0.0–0.2)
nRBC: 0 % (ref 0.0–0.2)

## 2022-03-25 LAB — GLUCOSE, CAPILLARY
Glucose-Capillary: 150 mg/dL — ABNORMAL HIGH (ref 70–99)
Glucose-Capillary: 220 mg/dL — ABNORMAL HIGH (ref 70–99)
Glucose-Capillary: 153 mg/dL — ABNORMAL HIGH (ref 70–99)
Glucose-Capillary: 264 mg/dL — ABNORMAL HIGH (ref 70–99)
Glucose-Capillary: 134 mg/dL — ABNORMAL HIGH (ref 70–99)

## 2022-03-25 LAB — SURGICAL PCR SCREEN
MRSA, PCR: NEGATIVE
Staphylococcus aureus: NEGATIVE

## 2022-03-25 LAB — CREATININE, SERUM
Creatinine, Ser: 0.94 mg/dL (ref 0.61–1.24)
GFR, Estimated: >60 mL/min (ref >60)

## 2022-03-25 LAB — PROTIME-INR
INR: 1 (ref 0.8–1.2)
Prothrombin Time: 13.3 seconds (ref 11.4–15.2)

## 2022-03-25 LAB — APTT: aPTT: 31 seconds (ref 24–36)

## 2022-03-25 SURGERY — TRANSCAROTID ARTERY REVASCULARIZATION (TCAR)
Anesthesia: General | Site: Neck | Laterality: Right

## 2022-03-25 MED ORDER — LEVOTHYROXINE SODIUM 75 MCG PO TABS
150.0000 ug | ORAL_TABLET | ORAL | Status: DC
  Administered 2022-03-26: 150 ug via ORAL
  Filled 2022-03-25: qty 2

## 2022-03-25 MED ORDER — SODIUM CHLORIDE 0.9 % IV SOLN
500.0000 mL | Freq: Once | INTRAVENOUS | Status: AC | PRN
  Administered 2022-03-25 (×2): 500 mL via INTRAVENOUS

## 2022-03-25 MED ORDER — ORAL CARE MOUTH RINSE: 15.0000 mL | Freq: Once | OROMUCOSAL | Status: AC

## 2022-03-25 MED ORDER — ROCURONIUM BROMIDE 10 MG/ML (PF) SYRINGE
PREFILLED_SYRINGE | INTRAVENOUS | Status: DC | PRN
  Administered 2022-03-25: 60 mg via INTRAVENOUS

## 2022-03-25 MED ORDER — INSULIN ASPART 100 UNIT/ML IJ SOLN
0.0000 [IU] | Freq: Three times a day (TID) | INTRAMUSCULAR | Status: DC
  Administered 2022-03-25: 8 [IU] via SUBCUTANEOUS
  Administered 2022-03-26 (×2): 2 [IU] via SUBCUTANEOUS

## 2022-03-25 MED ORDER — ALBUMIN HUMAN 5 % IV SOLN: INTRAVENOUS | Status: DC | PRN

## 2022-03-25 MED ORDER — HYDRALAZINE HCL 20 MG/ML IJ SOLN: 5.0000 mg | INTRAMUSCULAR | Status: DC | PRN

## 2022-03-25 MED ORDER — TAMSULOSIN HCL 0.4 MG PO CAPS
0.8000 mg | ORAL_CAPSULE | Freq: Every day | ORAL | Status: DC
  Administered 2022-03-25 – 2022-03-26 (×2): 0.8 mg via ORAL
  Filled 2022-03-25 (×3): qty 2

## 2022-03-25 MED ORDER — CHLORHEXIDINE GLUCONATE CLOTH 2 % EX PADS: 6.0000 | MEDICATED_PAD | Freq: Once | CUTANEOUS | Status: DC

## 2022-03-25 MED ORDER — HEMOSTATIC AGENTS (NO CHARGE) OPTIME
TOPICAL | Status: DC | PRN
  Administered 2022-03-25: 1 via TOPICAL

## 2022-03-25 MED ORDER — SODIUM CHLORIDE 0.9 % IV SOLN: INTRAVENOUS | Status: DC

## 2022-03-25 MED ORDER — HEPARIN 6000 UNIT IRRIGATION SOLUTION
Status: AC
  Filled 2022-03-25: qty 500

## 2022-03-25 MED ORDER — PHENYLEPHRINE 40 MCG/ML (10ML) SYRINGE FOR IV PUSH (FOR BLOOD PRESSURE SUPPORT)
PREFILLED_SYRINGE | INTRAVENOUS | Status: DC | PRN
  Administered 2022-03-25 (×5): 80 ug via INTRAVENOUS
  Administered 2022-03-25 (×2): 40 ug via INTRAVENOUS
  Administered 2022-03-25 (×3): 80 ug via INTRAVENOUS

## 2022-03-25 MED ORDER — ATROPINE SULFATE 0.4 MG/ML IV SOLN
INTRAVENOUS | Status: DC | PRN
  Administered 2022-03-25: .4 mg via INTRAVENOUS

## 2022-03-25 MED ORDER — NITROGLYCERIN 0.4 MG SL SUBL: 0.4000 mg | SUBLINGUAL_TABLET | SUBLINGUAL | Status: DC | PRN

## 2022-03-25 MED ORDER — ALBUMIN HUMAN 5 % IV SOLN
INTRAVENOUS | Status: AC
  Filled 2022-03-25: qty 250

## 2022-03-25 MED ORDER — EPHEDRINE SULFATE-NACL 50-0.9 MG/10ML-% IV SOSY
PREFILLED_SYRINGE | INTRAVENOUS | Status: DC | PRN
  Administered 2022-03-25: 10 mg via INTRAVENOUS
  Administered 2022-03-25 (×6): 5 mg via INTRAVENOUS

## 2022-03-25 MED ORDER — GLYCOPYRROLATE PF 0.2 MG/ML IJ SOSY
PREFILLED_SYRINGE | INTRAMUSCULAR | Status: DC | PRN
  Administered 2022-03-25: .2 mg via INTRAVENOUS

## 2022-03-25 MED ORDER — OXYCODONE-ACETAMINOPHEN 5-325 MG PO TABS
1.0000 | ORAL_TABLET | ORAL | Status: DC | PRN
  Administered 2022-03-25 – 2022-03-26 (×3): 1 via ORAL
  Filled 2022-03-25: qty 1
  Filled 2022-03-25: qty 2
  Filled 2022-03-25: qty 1

## 2022-03-25 MED ORDER — FENTANYL CITRATE (PF) 250 MCG/5ML IJ SOLN
INTRAMUSCULAR | Status: AC
  Filled 2022-03-25: qty 5

## 2022-03-25 MED ORDER — HYDROMORPHONE HCL 1 MG/ML IJ SOLN: 0.5000 mg | INTRAMUSCULAR | Status: DC | PRN

## 2022-03-25 MED ORDER — CYCLOSPORINE 0.05 % OP EMUL: 1.0000 [drp] | Freq: Every day | OPHTHALMIC | Status: DC

## 2022-03-25 MED ORDER — 0.9 % SODIUM CHLORIDE (POUR BTL) OPTIME
TOPICAL | Status: DC | PRN
Start: 2022-03-25 — End: 2022-03-25
  Administered 2022-03-25: 1000 mL

## 2022-03-25 MED ORDER — MUPIROCIN 2 % EX OINT
TOPICAL_OINTMENT | CUTANEOUS | Status: AC
  Filled 2022-03-25: qty 22

## 2022-03-25 MED ORDER — LATANOPROST 0.005 % OP SOLN
1.0000 [drp] | Freq: Every day | OPHTHALMIC | Status: DC
  Administered 2022-03-25 – 2022-03-26 (×2): 1 [drp] via OPHTHALMIC
  Filled 2022-03-25: qty 2.5

## 2022-03-25 MED ORDER — LACTATED RINGERS IV SOLN: INTRAVENOUS | Status: DC | PRN

## 2022-03-25 MED ORDER — SUGAMMADEX SODIUM 200 MG/2ML IV SOLN
INTRAVENOUS | Status: DC | PRN
Start: 2022-03-25 — End: 2022-03-25
  Administered 2022-03-25: 200 mg via INTRAVENOUS

## 2022-03-25 MED ORDER — LIDOCAINE 2% (20 MG/ML) 5 ML SYRINGE
INTRAMUSCULAR | Status: DC | PRN
  Administered 2022-03-25: 80 mg via INTRAVENOUS

## 2022-03-25 MED ORDER — PROPOFOL 10 MG/ML IV BOLUS
INTRAVENOUS | Status: AC
  Filled 2022-03-25: qty 20

## 2022-03-25 MED ORDER — METFORMIN HCL 500 MG PO TABS
500.0000 mg | ORAL_TABLET | Freq: Every day | ORAL | Status: DC
  Administered 2022-03-27: 500 mg via ORAL
  Filled 2022-03-25: qty 1

## 2022-03-25 MED ORDER — CLOPIDOGREL BISULFATE 75 MG PO TABS
75.0000 mg | ORAL_TABLET | Freq: Every day | ORAL | Status: DC
  Administered 2022-03-26 – 2022-03-27 (×2): 75 mg via ORAL
  Filled 2022-03-25 (×2): qty 1

## 2022-03-25 MED ORDER — IODIXANOL 320 MG/ML IV SOLN
INTRAVENOUS | Status: DC | PRN
  Administered 2022-03-25: 16 mL

## 2022-03-25 MED ORDER — LEVOTHYROXINE SODIUM 75 MCG PO TABS
175.0000 ug | ORAL_TABLET | ORAL | Status: DC
  Administered 2022-03-27: 175 ug via ORAL
  Filled 2022-03-25: qty 1

## 2022-03-25 MED ORDER — PROPOFOL 10 MG/ML IV BOLUS
INTRAVENOUS | Status: DC | PRN
  Administered 2022-03-25: 100 mg via INTRAVENOUS
  Administered 2022-03-25: 20 mg via INTRAVENOUS

## 2022-03-25 MED ORDER — HEPARIN 6000 UNIT IRRIGATION SOLUTION
Status: DC | PRN
  Administered 2022-03-25: 1

## 2022-03-25 MED ORDER — PHENYLEPHRINE HCL-NACL 20-0.9 MG/250ML-% IV SOLN
INTRAVENOUS | Status: DC | PRN
  Administered 2022-03-25: 60 ug/min via INTRAVENOUS

## 2022-03-25 MED ORDER — VASOPRESSIN 20 UNIT/ML IV SOLN
INTRAVENOUS | Status: AC
  Filled 2022-03-25: qty 1

## 2022-03-25 MED ORDER — VASOPRESSIN 20 UNIT/ML IV SOLN
INTRAVENOUS | Status: DC | PRN
  Administered 2022-03-25: 1 [IU] via INTRAVENOUS

## 2022-03-25 MED ORDER — LEVOTHYROXINE SODIUM 75 MCG PO TABS: 150.0000 ug | ORAL_TABLET | ORAL | Status: DC

## 2022-03-25 MED ORDER — ONDANSETRON HCL 4 MG/2ML IJ SOLN: 4.0000 mg | Freq: Four times a day (QID) | INTRAMUSCULAR | Status: DC | PRN

## 2022-03-25 MED ORDER — PANTOPRAZOLE SODIUM 40 MG PO TBEC
40.0000 mg | DELAYED_RELEASE_TABLET | Freq: Every day | ORAL | Status: DC
  Administered 2022-03-26 – 2022-03-27 (×2): 40 mg via ORAL
  Filled 2022-03-25 (×2): qty 1

## 2022-03-25 MED ORDER — DOCUSATE SODIUM 100 MG PO CAPS
100.0000 mg | ORAL_CAPSULE | Freq: Every day | ORAL | Status: DC
  Administered 2022-03-26: 100 mg via ORAL
  Filled 2022-03-25: qty 1

## 2022-03-25 MED ORDER — IRBESARTAN 300 MG PO TABS
300.0000 mg | ORAL_TABLET | Freq: Every day | ORAL | Status: DC
  Filled 2022-03-25: qty 1

## 2022-03-25 MED ORDER — POTASSIUM CHLORIDE CRYS ER 20 MEQ PO TBCR: 20.0000 meq | EXTENDED_RELEASE_TABLET | Freq: Every day | ORAL | Status: DC | PRN

## 2022-03-25 MED ORDER — SENNOSIDES-DOCUSATE SODIUM 8.6-50 MG PO TABS: 1.0000 | ORAL_TABLET | Freq: Every evening | ORAL | Status: DC | PRN

## 2022-03-25 MED ORDER — CHLORHEXIDINE GLUCONATE 0.12 % MT SOLN: 15.0000 mL | Freq: Once | OROMUCOSAL | Status: AC

## 2022-03-25 MED ORDER — ONDANSETRON HCL 4 MG/2ML IJ SOLN
INTRAMUSCULAR | Status: DC | PRN
Start: 2022-03-25 — End: 2022-03-25
  Administered 2022-03-25: 4 mg via INTRAVENOUS

## 2022-03-25 MED ORDER — DEXAMETHASONE SODIUM PHOSPHATE 10 MG/ML IJ SOLN
INTRAMUSCULAR | Status: DC | PRN
  Administered 2022-03-25: 5 mg via INTRAVENOUS

## 2022-03-25 MED ORDER — CEFAZOLIN SODIUM-DEXTROSE 2-4 GM/100ML-% IV SOLN
2.0000 g | Freq: Three times a day (TID) | INTRAVENOUS | Status: AC
  Administered 2022-03-25 (×2): 2 g via INTRAVENOUS
  Filled 2022-03-25 (×2): qty 100

## 2022-03-25 MED ORDER — CEFAZOLIN SODIUM-DEXTROSE 2-4 GM/100ML-% IV SOLN
2.0000 g | INTRAVENOUS | Status: AC
  Administered 2022-03-25: 2 g via INTRAVENOUS
  Filled 2022-03-25: qty 100

## 2022-03-25 MED ORDER — INSULIN ASPART 100 UNIT/ML IJ SOLN
0.0000 [IU] | INTRAMUSCULAR | Status: DC | PRN
  Administered 2022-03-25: 3 [IU] via SUBCUTANEOUS

## 2022-03-25 MED ORDER — CYCLOBENZAPRINE HCL 10 MG PO TABS: 10.0000 mg | ORAL_TABLET | Freq: Three times a day (TID) | ORAL | Status: DC | PRN

## 2022-03-25 MED ORDER — ACETAMINOPHEN 650 MG RE SUPP: 325.0000 mg | RECTAL | Status: DC | PRN

## 2022-03-25 MED ORDER — BISACODYL 5 MG PO TBEC
5.0000 mg | DELAYED_RELEASE_TABLET | Freq: Every day | ORAL | Status: DC | PRN
  Administered 2022-03-26: 5 mg via ORAL
  Filled 2022-03-25: qty 1

## 2022-03-25 MED ORDER — GUAIFENESIN-DM 100-10 MG/5ML PO SYRP: 15.0000 mL | ORAL_SOLUTION | ORAL | Status: DC | PRN

## 2022-03-25 MED ORDER — HEPARIN SODIUM (PORCINE) 5000 UNIT/ML IJ SOLN
5000.0000 [IU] | Freq: Three times a day (TID) | INTRAMUSCULAR | Status: DC
  Administered 2022-03-26 – 2022-03-27 (×4): 5000 [IU] via SUBCUTANEOUS
  Filled 2022-03-25 (×4): qty 1

## 2022-03-25 MED ORDER — ALUM & MAG HYDROXIDE-SIMETH 200-200-20 MG/5ML PO SUSP: 15.0000 mL | ORAL | Status: DC | PRN

## 2022-03-25 MED ORDER — ASPIRIN EC 81 MG PO TBEC
81.0000 mg | DELAYED_RELEASE_TABLET | Freq: Every day | ORAL | Status: DC
  Administered 2022-03-26 – 2022-03-27 (×2): 81 mg via ORAL
  Filled 2022-03-25 (×2): qty 1

## 2022-03-25 MED ORDER — PROTAMINE SULFATE 10 MG/ML IV SOLN
INTRAVENOUS | Status: DC | PRN
  Administered 2022-03-25: 50 mg via INTRAVENOUS

## 2022-03-25 MED ORDER — ACETAMINOPHEN 325 MG PO TABS: 325.0000 mg | ORAL_TABLET | ORAL | Status: DC | PRN

## 2022-03-25 MED ORDER — FENTANYL CITRATE (PF) 250 MCG/5ML IJ SOLN
INTRAMUSCULAR | Status: DC | PRN
  Administered 2022-03-25: 100 ug via INTRAVENOUS

## 2022-03-25 MED ORDER — HEPARIN SODIUM (PORCINE) 1000 UNIT/ML IJ SOLN
INTRAMUSCULAR | Status: DC | PRN
  Administered 2022-03-25: 8000 [IU] via INTRAVENOUS

## 2022-03-25 MED ORDER — ATORVASTATIN CALCIUM 10 MG PO TABS
20.0000 mg | ORAL_TABLET | Freq: Every day | ORAL | Status: DC
  Administered 2022-03-26 – 2022-03-27 (×2): 20 mg via ORAL
  Filled 2022-03-25 (×2): qty 2

## 2022-03-25 MED ORDER — NOREPINEPHRINE 4 MG/250ML-% IV SOLN
INTRAVENOUS | Status: DC | PRN
  Administered 2022-03-25: 4 ug/min via INTRAVENOUS

## 2022-03-25 MED ORDER — FLUTICASONE PROPIONATE 50 MCG/ACT NA SUSP
2.0000 | Freq: Every day | NASAL | Status: DC
  Administered 2022-03-26 – 2022-03-27 (×2): 2 via NASAL
  Filled 2022-03-25: qty 16

## 2022-03-25 MED ORDER — PREDNISOLONE ACETATE 1 % OP SUSP
1.0000 [drp] | Freq: Every day | OPHTHALMIC | Status: DC
  Administered 2022-03-26 – 2022-03-27 (×2): 1 [drp] via OPHTHALMIC
  Filled 2022-03-25: qty 5

## 2022-03-25 MED ORDER — COLCHICINE 0.6 MG PO TABS: 0.6000 mg | ORAL_TABLET | Freq: Every day | ORAL | Status: DC | PRN

## 2022-03-25 MED ORDER — LIDOCAINE HCL (PF) 1 % IJ SOLN
INTRAMUSCULAR | Status: AC
  Filled 2022-03-25: qty 30

## 2022-03-25 MED ORDER — AMLODIPINE BESYLATE 10 MG PO TABS
10.0000 mg | ORAL_TABLET | Freq: Every day | ORAL | Status: DC
Start: 2022-03-26 — End: 2022-03-27
  Filled 2022-03-25 (×2): qty 1

## 2022-03-25 MED ORDER — MAGNESIUM SULFATE 2 GM/50ML IV SOLN: 2.0000 g | Freq: Every day | INTRAVENOUS | Status: DC | PRN

## 2022-03-25 MED ORDER — PHENOL 1.4 % MT LIQD: 1.0000 | OROMUCOSAL | Status: DC | PRN

## 2022-03-25 MED ORDER — LABETALOL HCL 5 MG/ML IV SOLN: 10.0000 mg | INTRAVENOUS | Status: DC | PRN

## 2022-03-25 MED ORDER — CHLORHEXIDINE GLUCONATE 0.12 % MT SOLN
OROMUCOSAL | Status: AC
  Administered 2022-03-25: 15 mL via OROMUCOSAL
  Filled 2022-03-25: qty 15

## 2022-03-25 MED ORDER — ALBUMIN HUMAN 5 % IV SOLN
12.5000 g | Freq: Once | INTRAVENOUS | Status: AC
  Administered 2022-03-25: 12.5 g via INTRAVENOUS

## 2022-03-25 MED ORDER — MOMETASONE FUROATE 0.1 % EX OINT: 1.0000 "application " | TOPICAL_OINTMENT | Freq: Every day | CUTANEOUS | Status: DC

## 2022-03-25 MED ORDER — LACTATED RINGERS IV SOLN: INTRAVENOUS | Status: DC

## 2022-03-25 MED ORDER — METOPROLOL TARTRATE 5 MG/5ML IV SOLN: 2.0000 mg | INTRAVENOUS | Status: DC | PRN

## 2022-03-25 MED ORDER — SODIUM CHLORIDE 0.9 % IV SOLN
0.1500 ug/kg/min | INTRAVENOUS | Status: AC
  Administered 2022-03-25: .15 ug/kg/min via INTRAVENOUS
  Filled 2022-03-25: qty 2000

## 2022-03-25 SURGICAL SUPPLY — 56 items
ADH SKN CLS APL DERMABOND .7 (GAUZE/BANDAGES/DRESSINGS) ×2
AGENT HMST KT MTR STRL THRMB (HEMOSTASIS) ×1
BAG BANDED W/RUBBER/TAPE 36X54 (MISCELLANEOUS) ×3 IMPLANT
BAG COUNTER SPONGE SURGICOUNT (BAG) ×3 IMPLANT
BAG EQP BAND 135X91 W/RBR TAPE (MISCELLANEOUS) ×1
BAG SPNG CNTER NS LX DISP (BAG) ×1
BALLN STERLING RX 6X30X80 (BALLOONS) ×2
BALLOON STERLING RX 6X30X80 (BALLOONS) IMPLANT
CANISTER SUCT 3000ML PPV (MISCELLANEOUS) ×3 IMPLANT
CATH SUCT 10FR WHISTLE TIP (CATHETERS) ×3 IMPLANT
CLIP VESOCCLUDE MED 6/CT (CLIP) ×3 IMPLANT
CLIP VESOCCLUDE SM WIDE 6/CT (CLIP) ×3 IMPLANT
COVER DOME SNAP 22 D (MISCELLANEOUS) ×4 IMPLANT
COVER PROBE W GEL 5X96 (DRAPES) ×3 IMPLANT
DERMABOND ADVANCED (GAUZE/BANDAGES/DRESSINGS) ×2
DERMABOND ADVANCED .7 DNX12 (GAUZE/BANDAGES/DRESSINGS) ×2 IMPLANT
DRAPE FEMORAL ANGIO 80X135IN (DRAPES) ×3 IMPLANT
ELECT REM PT RETURN 9FT ADLT (ELECTROSURGICAL) ×2
ELECTRODE REM PT RTRN 9FT ADLT (ELECTROSURGICAL) ×2 IMPLANT
GLOVE SRG 8 PF TXTR STRL LF DI (GLOVE) ×2 IMPLANT
GLOVE SURG POLYISO LF SZ7.5 (GLOVE) ×3 IMPLANT
GLOVE SURG UNDER POLY LF SZ7 (GLOVE) ×1 IMPLANT
GLOVE SURG UNDER POLY LF SZ8 (GLOVE) ×2
GOWN STRL REUS W/ TWL LRG LVL3 (GOWN DISPOSABLE) ×4 IMPLANT
GOWN STRL REUS W/ TWL XL LVL3 (GOWN DISPOSABLE) ×2 IMPLANT
GOWN STRL REUS W/TWL LRG LVL3 (GOWN DISPOSABLE) ×4
GOWN STRL REUS W/TWL XL LVL3 (GOWN DISPOSABLE) ×2
GUIDEWIRE ENROUTE 0.014 (WIRE) ×3 IMPLANT
HEMOSTAT SNOW SURGICEL 2X4 (HEMOSTASIS) IMPLANT
INTRODUCER KIT GALT 7CM (INTRODUCER) ×4
KIT BASIN OR (CUSTOM PROCEDURE TRAY) ×3 IMPLANT
KIT ENCORE 26 ADVANTAGE (KITS) ×3 IMPLANT
KIT INTRODUCER GALT 7 (INTRODUCER) ×2 IMPLANT
KIT TURNOVER KIT B (KITS) ×3 IMPLANT
NDL HYPO 25GX1X1/2 BEV (NEEDLE) IMPLANT
NEEDLE HYPO 25GX1X1/2 BEV (NEEDLE) IMPLANT
PACK CAROTID (CUSTOM PROCEDURE TRAY) ×3 IMPLANT
POSITIONER HEAD DONUT 9IN (MISCELLANEOUS) ×3 IMPLANT
SET MICROPUNCTURE 5F STIFF (MISCELLANEOUS) ×1 IMPLANT
STENT TRANSCAROTID SYS 10X40 (Permanent Stent) ×1 IMPLANT
SURGIFLO W/THROMBIN 8M KIT (HEMOSTASIS) ×1 IMPLANT
SUT PROLENE 5 0 C 1 24 (SUTURE) ×8 IMPLANT
SUT PROLENE 6 0 BV (SUTURE) IMPLANT
SUT SILK 2 0 PERMA HAND 18 BK (SUTURE) ×3 IMPLANT
SUT SILK 2 0 SH (SUTURE) ×3 IMPLANT
SUT VIC AB 3-0 SH 27 (SUTURE) ×4
SUT VIC AB 3-0 SH 27X BRD (SUTURE) ×4 IMPLANT
SUT VIC AB 4-0 PS2 27 (SUTURE) ×3 IMPLANT
SYR 10ML LL (SYRINGE) ×9 IMPLANT
SYR 20ML LL LF (SYRINGE) ×3 IMPLANT
SYR CONTROL 10ML LL (SYRINGE) IMPLANT
SYSTEM TRANSCAROTID NEUROPRTCT (MISCELLANEOUS) ×2 IMPLANT
TOWEL GREEN STERILE (TOWEL DISPOSABLE) ×3 IMPLANT
TRANSCAROTID NEUROPROTECT SYS (MISCELLANEOUS) ×2
WATER STERILE IRR 1000ML POUR (IV SOLUTION) ×3 IMPLANT
WIRE BENTSON .035X145CM (WIRE) ×3 IMPLANT

## 2022-03-25 NOTE — Anesthesia Procedure Notes (Signed)
Procedure Name: Intubation ?Date/Time: 03/25/2022 8:00 AM ?Performed by: Dorann Lodge, CRNA ?Pre-anesthesia Checklist: Patient identified, Emergency Drugs available, Suction available and Patient being monitored ?Patient Re-evaluated:Patient Re-evaluated prior to induction ?Oxygen Delivery Method: Circle System Utilized ?Preoxygenation: Pre-oxygenation with 100% oxygen ?Induction Type: IV induction ?Ventilation: Mask ventilation without difficulty and Oral airway inserted - appropriate to patient size ?Laryngoscope Size: Mac and 4 ?Grade View: Grade I ?Tube type: Oral ?Number of attempts: 1 ?Airway Equipment and Method: Stylet and Oral airway ?Placement Confirmation: ETT inserted through vocal cords under direct vision, positive ETCO2 and breath sounds checked- equal and bilateral ?Secured at: 23 cm ?Tube secured with: Tape ?Dental Injury: Teeth and Oropharynx as per pre-operative assessment  ? ? ? ? ?

## 2022-03-25 NOTE — Transfer of Care (Signed)
Immediate Anesthesia Transfer of Care Note ? ?Patient: Christopher Randolph ? ?Procedure(s) Performed: Right Transcarotid Artery Revascularization (Right: Neck) ? ?Patient Location: PACU ? ?Anesthesia Type:General ? ?Level of Consciousness: awake, alert  and oriented ? ?Airway & Oxygen Therapy: Patient Spontanous Breathing and Patient connected to face mask oxygen ? ?Post-op Assessment: Report given to RN and Post -op Vital signs reviewed and stable  ?MDA notified of patient requiring Phenylephrine as well as Norepinephrine drips to maintain SBP>100. Patient alert and oriented, following commands, strength equal in all extremities.  ? ?Post vital signs: Reviewed and stable ? ?Last Vitals:  ?Vitals Value Taken Time  ?BP 124/49 03/25/22 0951  ?Temp    ?Pulse 74 03/25/22 0953  ?Resp 16 03/25/22 0953  ?SpO2 97 % 03/25/22 0953  ?Vitals shown include unvalidated device data. ? ?Last Pain:  ?Vitals:  ? 03/25/22 0624  ?TempSrc:   ?PainSc: 0-No pain  ?   ? ?  ? ?Complications: No notable events documented. ?

## 2022-03-25 NOTE — Progress Notes (Signed)
Patient seen and evaluated in the recovery room following right sided TCAR ?He is neurologically intact moving all extremities.  The right carotid incision is without hematoma.  There is a small hematoma in the left groin.  Additional manual pressure is being held. ?He is requiring pressors for his blood pressure.  He is stable for transfer to 4 E. ? ?Christopher Randolph ?

## 2022-03-25 NOTE — Op Note (Signed)
? ? ?Patient name: Christopher Randolph MRN: [id number] DOB: 1946/06/03 Sex: male ? ?03/25/2022 ?Pre-operative Diagnosis: Symptomatic right carotid stenosis ?Post-operative diagnosis:  Same ?Surgeon:  Annamarie Major ?Assistants:  Laurence Slate ?Procedure:   #1: Right carotid stent (TCAR) ?  #2: Ultrasound-guided access, left common femoral vein ?  #3: Flow reversal neuro protection ?Anesthesia:  General ?Blood Loss:  minimal ?Specimens:  none ? ?Findings: Calcified right carotid artery with 90% stenosis, resolved after intervention ? ?Stent: ENROUTE 10 x 40 ?Predilation balloon: 6 x 30 ?Flow reversal time: 12 minutes ?Contrast: 60 cc ?Dose area: 1740 ?Fluoroscopy: 2.6 minutes ?Procedure time: 50 minutes ? ?Indications: This is a 76 year old gentleman who has had 1 episode of right eye amaurosis.  He has had no further symptoms.  CT scan revealed heavily calcified high-grade right carotid stenosis which was felt to be the etiology of his amaurosis.  I discussed carotid endarterectomy versus TCAR.  He wished to proceed with carotid stenting.  All risks were discussed with the patient.  He agreed to proceed. ? ?Procedure:  The patient was identified in the holding area and taken to [facility]  The patient was then placed supine on the table. general anesthesia was administered.  The patient was prepped and draped in the usual sterile fashion.  A time out was called and antibiotics were administered.  A PA was necessary to expedite the procedure and assist with technical details. ? ?Ultrasound was used to evaluate the left common femoral vein which was widely patent and easily compressible.  It was cannulated under ultrasound guidance with a micropuncture needle.  A [place] wire was inserted without resistance followed by placement of micropuncture sheath.  A 035 wire was then placed followed by insertion of the TCAR sheath. ? ?Next the location of the carotid artery above the clavicle was identified with ultrasound.  I made  a transverse incision just above the clavicle.  Cautery was used divide subcutaneous tissue and platysma muscle.  I then identified the 2 heads of the sternocleidomastoid and developed an avascular plane between them.  The PA helped with exposure by providing suction and retraction.  Ultimately I was able to identify the common carotid artery.  This was disease-free.  It was encircled with a vessel loop and an umbilical tape.  The patient was then fully heparinized.  I placed a 5-0 Prolene U stitch on the anterior surface of the carotid artery.  A micropuncture needle was then used to cannulate the common carotid artery.  A 018 wire was inserted up to 5 cm.  A micropuncture sheath was then placed and at 3 cm.  A contrast injection was then performed locating the proximal extent of the disease which was marked on the screen.  I then placed a carotid Amplatz wire up to this level.  The micropuncture sheath was removed and the TCAR sheath was placed.  The sheath was secured with 2 silk sutures.  The flow reversal tubing was then connected.  Passive flow reversal was confirmed with a saline flush. ? ?Next, a TCAR timeout was performed.  The ACT was 290.  Heart rate and blood pressure were at appropriate levels.  Active flow reversal was then initiated by occluding the proximal carotid artery with a vessel loop.  An 018 wire and the predilation 6 x 30 balloon was then inserted.  Additional contrast imaging was performed locating the internal carotid artery.  This also confirmed that there were no access site complications.  The  wire was then easily advanced into the internal carotid artery through the lesion.  The 6 x 30 predilation balloon was then inflated.  The patient had a dramatic hemodynamic response with significant drop in blood pressure and heart rate.  This quickly resolved with atropine Robamol and deflation of the balloon.  The stent was then inserted.  This was a ENROUTE 10 x 40 stent.  It was successfully  deployed across the lesion.  I waited 2 minutes and then performed follow-up imaging in 2 different views which showed resolution of the stenosis with a widely patent internal carotid artery.  The wire was then removed.  Flow reversal was discontinued.  Blood was returned to the patient via the femoral sheath.  I then removed the carotid sheath and secured the arteriotomy site with the previously placed 5-0 Prolene.  Hand-held Doppler was used to evaluate the signal in the carotid artery which was excellent.  The patient's heparin was then reversed with 50 mg of protamine.  The sheath in the left groin was removed and manual pressure was held for hemostasis.  I then evaluated the carotid artery.  Hemostasis was achieved.  The wound was irrigated.  The platysma muscle was reapproximated with 3-0 Vicryl and skin was closed with 3-0 Vicryl followed by Dermabond.  Patient was successfully extubated, found to be moving all extremities to command.  He was taken recovery in stable condition. ? ? ?Disposition: To PACU stable. ? ? ?V. Annamarie Major, M.D., FACS ?[facility] ?Office: [phone] ?Pager:  [phone]  ?

## 2022-03-25 NOTE — Anesthesia Postprocedure Evaluation (Signed)
Anesthesia Post Note ? ?Patient: Christopher Randolph ? ?Procedure(s) Performed: Right Transcarotid Artery Revascularization (Right: Neck) ? ?  ? ?Patient location during evaluation: PACU ?Anesthesia Type: General ?Level of consciousness: awake and alert ?Pain management: pain level controlled ?Vital Signs Assessment: post-procedure vital signs reviewed and stable ?Respiratory status: spontaneous breathing ?Cardiovascular status: stable ?Anesthetic complications: no ? ? ?No notable events documented. ? ?Last Vitals:  ?Vitals:  ? 03/25/22 1334 03/25/22 1357  ?BP: (!) 110/49 (!) 105/47  ?Pulse: (!) 52 (!) 54  ?Resp: 19 16  ?Temp: 36.7 ?C 36.6 ?C  ?SpO2: 94% 91%  ?  ?Last Pain:  ?Vitals:  ? 03/25/22 1357  ?TempSrc: Oral  ?PainSc: 0-No pain  ? ? ?  ?  ?  ?  ?  ?  ? ?Nolon Nations ? ? ? ? ?

## 2022-03-25 NOTE — Progress Notes (Signed)
03/25/2022 ?1350 ?Received pt to room 4E-05 from PACU.  Pt is SP TCAR.  Pt is A&Ox4, Neuro intact.  Tele monitor applied and CCMD notified.  CHG bath given.  Oriented to room, call light and bed.  Call bell in reach and family at bedside. ?Brion Aliment C ? ?

## 2022-03-25 NOTE — Interval H&P Note (Signed)
History and Physical Interval Note: ? ?03/25/2022 ?7:25 AM ? ?Christopher Randolph  has presented today for surgery, with the diagnosis of Right carotid artery stenosis.  The various methods of treatment have been discussed with the patient and family. After consideration of risks, benefits and other options for treatment, the patient has consented to  Procedure(s): ?Right Transcarotid Artery Revascularization (Right) as a surgical intervention.  The patient's history has been reviewed, patient examined, no change in status, stable for surgery.  I have reviewed the patient's chart and labs.  Questions were answered to the patient's satisfaction.   ? ? ?Wells Romina Divirgilio ? ? ?

## 2022-03-26 ENCOUNTER — Other Ambulatory Visit (HOSPITAL_COMMUNITY): Payer: Self-pay

## 2022-03-26 ENCOUNTER — Encounter (HOSPITAL_COMMUNITY): Payer: Self-pay | Admitting: Surgery

## 2022-03-26 LAB — GLUCOSE, CAPILLARY
Glucose-Capillary: 131 mg/dL — ABNORMAL HIGH (ref 70–99)
Glucose-Capillary: 134 mg/dL — ABNORMAL HIGH (ref 70–99)
Glucose-Capillary: 114 mg/dL — ABNORMAL HIGH (ref 70–99)
Glucose-Capillary: 110 mg/dL — ABNORMAL HIGH (ref 70–99)

## 2022-03-26 LAB — CBC
HCT: 33 % — ABNORMAL LOW (ref 39.0–52.0)
Hemoglobin: 11.1 g/dL — ABNORMAL LOW (ref 13.0–17.0)
MCH: 30.7 pg (ref 26.0–34.0)
MCHC: 33.6 g/dL (ref 30.0–36.0)
MCV: 91.4 fL (ref 80.0–100.0)
Platelets: 113 10*3/uL — ABNORMAL LOW (ref 150–400)
RBC: 3.61 MIL/uL — ABNORMAL LOW (ref 4.22–5.81)
RDW: 13.1 % (ref 11.5–15.5)
WBC: 6 10*3/uL (ref 4.0–10.5)
nRBC: 0 % (ref 0.0–0.2)

## 2022-03-26 LAB — BASIC METABOLIC PANEL
Anion gap: 8 (ref 5–15)
BUN: 19 mg/dL (ref 8–23)
CO2: 22 mmol/L (ref 22–32)
Calcium: 7.8 mg/dL — ABNORMAL LOW (ref 8.9–10.3)
Chloride: 108 mmol/L (ref 98–111)
Creatinine, Ser: 1.03 mg/dL (ref 0.61–1.24)
GFR, Estimated: >60 mL/min (ref >60)
Glucose, Bld: 147 mg/dL — ABNORMAL HIGH (ref 70–99)
Potassium: 5 mmol/L (ref 3.5–5.1)
Sodium: 138 mmol/L (ref 135–145)

## 2022-03-26 LAB — LIPID PANEL
Cholesterol: 88 mg/dL (ref 0–200)
HDL: 31 mg/dL — ABNORMAL LOW (ref >40)
LDL Cholesterol: 45 mg/dL (ref 0–99)
Total CHOL/HDL Ratio: 2.8 RATIO
Triglycerides: 61 mg/dL (ref <150)
VLDL: 12 mg/dL (ref 0–40)

## 2022-03-26 LAB — MUSK ANTIBODIES: MuSK Antibodies: <1 U/mL

## 2022-03-26 LAB — POCT ACTIVATED CLOTTING TIME: Activated Clotting Time: 293 seconds

## 2022-03-26 LAB — ACHR ABS WITH REFLEX TO MUSK: AChR Binding Ab, Serum: <0.03 nmol/L (ref 0.00–0.24)

## 2022-03-26 MED ORDER — OXYCODONE-ACETAMINOPHEN 5-325 MG PO TABS
1.0000 | ORAL_TABLET | Freq: Four times a day (QID) | ORAL | 0 refills | Status: DC | PRN
  Filled 2022-03-26: qty 12, 3d supply, fill #0

## 2022-03-26 NOTE — TOC Transition Note (Signed)
Transition of Care (TOC) - CM/SW Discharge Note ?Marvetta Gibbons Therapist, sports, BSN ?Transitions of Care ?Unit 4E- RN Case Manager ?See Treatment Team for direct phone #  ? ? ?Patient Details  ?Name: Christopher Randolph ?MRN: [id number] ?Date of Birth: 01-28-1946 ? ?Transition of Care (TOC) CM/SW Contact:  ?Dahlia Client, Romeo Rabon, RN ?Phone Number: ?03/26/2022, 10:24 AM ? ? ?Clinical Narrative:    ?Pt stable for transition home today, Transition of Care Department [facility]) has reviewed patient and no TOC needs have been identified at this time.  ? ? ?Final next level of care: Home/Self Care ?Barriers to Discharge: No Barriers Identified ? ? ?Patient Goals and CMS Choice ?  ?  ?Choice offered to / list presented to : NA ? ?Discharge Placement ?  ?           ? home ?  ?  ?  ? ?Discharge Plan and Services ?  ?  ?           ?DME Arranged: N/A ?DME Agency: NA ?  ?  ?  ?HH Arranged: NA ?[employer] Agency: NA ?  ?  ?  ? ?Social Determinants of Health (SDOH) Interventions ?  ? ? ?Readmission Risk Interventions ? ?  03/26/2022  ? 10:24 AM  ?Readmission Risk Prevention Plan  ?Post Dischage Appt Complete  ?Medication Screening Complete  ?Transportation Screening Complete  ? ? ? ? ? ?

## 2022-03-26 NOTE — Progress Notes (Signed)
Pt ambulated about 80 ft in hallway with rolling walker. Pt complained of feeling "swimmy-headed" and "short of breath". Pt's oxygen saturations remained 92-94% on room air. PA notified of pt's symptoms. Advised to discontinue discharge order and check labs in am. Pt aware of change of plan.  ?

## 2022-03-26 NOTE — Discharge Instructions (Signed)
   [facility]  Discharge Instructions   Carotid Endarterectomy (CEA)  Please refer to the following instructions for your post-procedure care. Your surgeon or physician assistant will discuss any changes with you.  Activity  You are encouraged to walk as much as you can. You can slowly return to normal activities but must avoid strenuous activity and heavy lifting until your doctor tell you it's OK. Avoid activities such as vacuuming or swinging a golf club. You can drive after one week if you are comfortable and you are no longer taking prescription pain medications. It is normal to feel tired for serval weeks after your surgery. It is also normal to have difficulty with sleep habits, eating, and bowel movements after surgery. These will go away with time.  Bathing/Showering  You may shower after you come home. Do not soak in a bathtub, hot tub, or swim until the incision heals completely.  Incision Care  Shower every day. Clean your incision with mild soap and water. Pat the area dry with a clean towel. You do not need a bandage unless otherwise instructed. Do not apply any ointments or creams to your incision. You may have skin glue on your incision. Do not peel it off. It will come off on its own in about one week. Your incision may feel thickened and raised for several weeks after your surgery. This is normal and the skin will soften over time. For Men Only: It's OK to shave around the incision but do not shave the incision itself for 2 weeks. It is common to have numbness under your chin that could last for several months.  Diet  Resume your normal diet. There are no special food restrictions following this procedure. A low fat/low cholesterol diet is recommended for all patients with vascular disease. In order to heal from your surgery, it is CRITICAL to get adequate nutrition. Your body requires vitamins, minerals, and protein. Vegetables are the best  source of vitamins and minerals. Vegetables also provide the perfect balance of protein. Processed food has little nutritional value, so try to avoid this.        Medications  Resume taking all of your medications unless your doctor or physician assistant tells you not to. If your incision is causing pain, you may take over-the- counter pain relievers such as acetaminophen (Tylenol). If you were prescribed a stronger pain medication, please be aware these medications can cause nausea and constipation. Prevent nausea by taking the medication with a snack or meal. Avoid constipation by drinking plenty of fluids and eating foods with a high amount of fiber, such as fruits, vegetables, and grains. Do not take Tylenol if you are taking prescription pain medications.  Follow Up  Our office will schedule a follow up appointment 2-3 weeks following discharge.  Please call us immediately for any of the following conditions  Increased pain, redness, drainage (pus) from your incision site. Fever of 101 degrees or higher. If you should develop stroke (slurred speech, difficulty swallowing, weakness on one side of your body, loss of vision) you should call 911 and go to the nearest emergency room.  Reduce your risk of vascular disease:  Stop smoking. If you would like help call [facility] at 1-800-QUIT-NOW (1-800-784-8669) or [place] at [phone]. Manage your cholesterol Maintain a desired weight Control your diabetes Keep your blood pressure down  If you have any questions, please call the office at [phone].   

## 2022-03-26 NOTE — Progress Notes (Signed)
? ? ?  Subjective  - POD #1, s/p right TCAR ? ?No overnight issues ? ? ?Physical Exam: ? ?Right neck incision tenser but soft .  No hematoma ?Left groin stick with ecchymosis ?Neuro intact ? ? ? ? ? ? ?Assessment/Plan:  POD #1 ? ?Mobilize this am.  Once ambulating and tolerating diet, can be d/c'd ?May need to hold BP meds for a few days given SBP around 100. ?D/c on ASA, Plavix, and statin ? ?Christopher Randolph ?03/26/2022 ?7:46 AM ?-- ? ?Vitals:  ? 03/26/22 0416 03/26/22 0433  ?BP: (!) 100/51 (!) 105/50  ?Pulse: (!) 47 (!) 45  ?Resp: 17 17  ?Temp: 98 ?F (36.7 ?C) 98 ?F (36.7 ?C)  ?SpO2: 93% 92%  ? ? ?Intake/Output Summary (Last 24 hours) at 03/26/2022 0746 ?Last data filed at 03/26/2022 0537 ?Gross per 24 hour  ?Intake 2367.99 ml  ?Output 27 ml  ?Net 2340.99 ml  ? ? ? ?Laboratory ?CBC ?   ?Component Value Date/Time  ? WBC 6.0 03/26/2022 0127  ? HGB 11.1 (L) 03/26/2022 0127  ? HCT 33.0 (L) 03/26/2022 0127  ? PLT 113 (L) 03/26/2022 0127  ? ? ?BMET ?   ?Component Value Date/Time  ? NA 138 03/26/2022 0127  ? K 5.0 03/26/2022 0127  ? CL 108 03/26/2022 0127  ? CO2 22 03/26/2022 0127  ? GLUCOSE 147 (H) 03/26/2022 0127  ? BUN 19 03/26/2022 0127  ? CREATININE 1.03 03/26/2022 0127  ? CALCIUM 7.8 (L) 03/26/2022 0127  ? GFRNONAA >60 03/26/2022 0127  ? GFRAA >60 04/04/2020 0957  ? ? ?COAG ?Lab Results  ?Component Value Date  ? INR 1.0 03/25/2022  ? INR 1.04 06/23/2010  ? ?No results found for: PTT ? ?Antibiotics ?Anti-infectives (From admission, onward)  ? ? Start     Dose/Rate Route Frequency Ordered Stop  ? 03/25/22 1600  ceFAZolin (ANCEF) IVPB 2g/100 mL premix       ? 2 g ?200 mL/hr over 30 Minutes Intravenous Every 8 hours 03/25/22 1405 03/26/22 0016  ? 03/25/22 0549  ceFAZolin (ANCEF) IVPB 2g/100 mL premix       ? 2 g ?200 mL/hr over 30 Minutes Intravenous 30 min pre-op 03/25/22 0549 03/25/22 0804  ? ?  ? ? ? ?V. Leia Alf, M.D., FACS ?[facility] ?Office: [phone] ?Pager:  [phone]   ?

## 2022-03-27 LAB — CBC
HCT: 32.1 % — ABNORMAL LOW (ref 39.0–52.0)
Hemoglobin: 10.5 g/dL — ABNORMAL LOW (ref 13.0–17.0)
MCH: 30.3 pg (ref 26.0–34.0)
MCHC: 32.7 g/dL (ref 30.0–36.0)
MCV: 92.8 fL (ref 80.0–100.0)
Platelets: 116 10*3/uL — ABNORMAL LOW (ref 150–400)
RBC: 3.46 MIL/uL — ABNORMAL LOW (ref 4.22–5.81)
RDW: 13.3 % (ref 11.5–15.5)
WBC: 5.9 10*3/uL (ref 4.0–10.5)
nRBC: 0 % (ref 0.0–0.2)

## 2022-03-27 LAB — GLUCOSE, CAPILLARY
Glucose-Capillary: 99 mg/dL (ref 70–99)
Glucose-Capillary: 101 mg/dL — ABNORMAL HIGH (ref 70–99)

## 2022-03-27 NOTE — Care Management Important Message (Signed)
Important Message ? ?Patient Details  ?Name: Christopher Randolph ?MRN: [id number] ?Date of Birth: 1946-04-13 ? ? ?Medicare Important Message Given:  Yes ? ? ? ? ?[name] ?03/27/2022, 8:36 AM ?

## 2022-03-27 NOTE — Progress Notes (Signed)
?  Progress Note ? ? ? ?03/27/2022 ?7:16 AM ?2 Days Post-Op ? ?Subjective:  feeling better. Says he has ambulated several times since yesterday afternoon and tolerated well. No dizziness. Tolerating diet. Voiding without difficulty ? ? ?Vitals:  ? 03/27/22 0414 03/27/22 0500  ?BP: (!) 98/57 (!) 98/57  ?Pulse: (!) 51 (!) 51  ?Resp: 16 16  ?Temp: 97.8 ?F (36.Christopher ?C) 97.8 ?F (36.Christopher ?C)  ?SpO2:  94%  ? ?Physical Exam: ?Cardiac:  regular ?Lungs:  non labored, on [place] ?Incisions:  right supraclavicular incision is c/d/I without swelling or hematoma. Left femoral vein access site with mild ecchymosis, soft. No hematoma ?Extremities:  moving all extremities without deficits. ?Abdomen:  soft, non tender ?Neurologic: alert and oriented. Smile symmetric. Tongue midline. Speech coherent.  ? ?CBC ?   ?Component Value Date/Time  ? WBC 5.9 03/27/2022 0118  ? RBC 3.46 (L) 03/27/2022 0118  ? HGB 10.5 (L) 03/27/2022 0118  ? HCT 32.1 (L) 03/27/2022 0118  ? PLT 116 (L) 03/27/2022 0118  ? MCV 92.8 03/27/2022 0118  ? MCH 30.3 03/27/2022 0118  ? MCHC 32.7 03/27/2022 0118  ? RDW 13.3 03/27/2022 0118  ? LYMPHSABS 1.4 06/23/2010 0800  ? MONOABS 0.4 06/23/2010 0800  ? EOSABS 0.2 06/23/2010 0800  ? BASOSABS 0.0 06/23/2010 0800  ? ? ?BMET ?   ?Component Value Date/Time  ? NA 138 03/26/2022 0127  ? K 5.0 03/26/2022 0127  ? CL 108 03/26/2022 0127  ? CO2 22 03/26/2022 0127  ? GLUCOSE 147 (H) 03/26/2022 0127  ? BUN 19 03/26/2022 0127  ? CREATININE 1.03 03/26/2022 0127  ? CALCIUM 7.8 (L) 03/26/2022 0127  ? GFRNONAA >60 03/26/2022 0127  ? GFRAA >60 04/04/2020 0957  ? ? ?INR ?   ?Component Value Date/Time  ? INR 1.0 03/25/2022 0549  ? ? ? ?Intake/Output Summary (Last 24 hours) at 03/27/2022 0716 ?Last data filed at 03/26/2022 1504 ?Gross per 24 hour  ?Intake 351.9 ml  ?Output --  ?Net 351.9 ml  ? ? ? ?Assessment/Plan:  76 y.o. Randolph is s/p Right TCAR 2 Days Post-Op  ? ?Neurologically intact ?Blood pressure remains soft. Will have him hold BP meds for several  days. Has monitor to check daily at home ?O2 sats in low 90's. Says he does not require oxygen at home ?Will continue Aspirin, statin, Plavix ?Mobilize this morning and as long as he tolerates okay for d/c ?Stable for discharge home today ? ? ?Karoline Caldwell, PA-C ?[facility] ?[phone] ?03/27/2022 ?7:16 AM ?

## 2022-03-27 NOTE — Discharge Summary (Signed)
? ?Carotid Discharge Summary ? ? ? ? ?Christopher Randolph ?1946/04/19 76 y.o. male ? ?[id number] ? ?Admission Date: ?03/25/2022 ? ?Discharge Date: ?03/27/2022 ? ?Physician: ?Serafina Mitchell, MD ? ?Admission Diagnosis: ?Carotid stenosis [I65.29] ? ? ?Hospital Course:  ?The patient was admitted to the hospital and taken to the operating room on 03/25/2022 and underwent Right Transcarotid artery revascularization (TCAR) by Dr. Trula Slade.  The pt tolerated the procedure well and was transported to the PACU in good condition. ? ?Patient remained stable in there recovery room. Neurologically intact. Neck incision intact and without swelling or hematoma. Small hematoma in left groin. Manual pressure held. Pressors required for maintaining blood pressure. Otherwise stable and transferred to the floor. ? ?By POD 1, the pt neuro status remained intact. No overnight issues. Tolerating diet. Voiding without difficulty. Tried trial of mobilizing in hallway prior to discharge. Patient got light headed and short of breath. He was returned to his room. Discharge home was discontinued. Continued monitor throughout afternoon and evening. No further issues.  ? ?The remainder of the hospital course consisted of increasing mobilization and increasing intake of solids without difficulty. ? ?By POD 2, patients neurologic status stable. Blood pressure remained slightly low. Patient asymptomatic. Able to ambulate without any symptoms. He remained stable for discharge home. He will continue Plavix, Aspirin, and statin. PDMP was reviewed and post operative pain medication was sent to patients pharmacy. He was instructed to hold home blood pressure medications until his systolic pressure maintained > 120. Patient has blood pressure cuff at home so he is able to monitor it himself. Discharge instructions provided to patient. He has follow up arranged in 1 month with carotid duplex.  ? ? ?Recent Labs  ?  03/25/22 ?4098 03/26/22 ?0127  ?NA 139 138  ?K  3.9 5.0  ?CL 108 108  ?CO2 23 22  ?GLUCOSE 166* 147*  ?BUN 15 19  ?CALCIUM 8.6* 7.8*  ? ?Recent Labs  ?  03/26/22 ?0127 03/27/22 ?0118  ?WBC 6.0 5.9  ?HGB 11.1* 10.5*  ?HCT 33.0* 32.1*  ?PLT 113* 116*  ? ?Recent Labs  ?  03/25/22 ?1191  ?INR 1.0  ? ? ? ?Discharge Instructions   ? ? Call MD for:  redness, tenderness, or signs of infection (pain, swelling, bleeding, redness, odor or green/yellow discharge around incision site)   Complete by: As directed ?  ? Call MD for:  severe or increased pain, loss or decreased feeling  in affected limb(s)   Complete by: As directed ?  ? Call MD for:  temperature >100.5   Complete by: As directed ?  ? Discharge instructions   Complete by: As directed ?  ? If BP is greater than 478 systolic (top number) you may take your BP medication.  If BP below 120 do not take.  Our goal is to keep BP 100-160 during recovery.  If you have issues call our office or your PCP.  ? Discharge patient   Complete by: As directed ?  ? If he tolerates ambulating today without issues okay for d/c later today  ? Discharge disposition: 01-Home or Self Care  ? Discharge patient date: 03/27/2022  ? Driving Restrictions   Complete by: As directed ?  ? No driving for 1 week  ? Resume previous diet   Complete by: As directed ?  ? ?  ? ? ?Discharge Diagnosis:  ?Carotid stenosis [I65.29] ? ?Secondary Diagnosis: ?Patient Active Problem List  ? Diagnosis Date Noted  ? Carotid  stenosis 03/25/2022  ? TIA (transient ischemic attack) 02/27/2022  ? Biceps tendon rupture, proximal, right, initial encounter 05/16/2020  ? 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 Heme positive stool 10/30/2016  ? Systolic murmur   ? Dizziness   ? Carotid artery disease ([place])   ? PAD (peripheral artery disease) ([place])   ? CAD (coronary artery disease)   ? Hypertension   ? Hypothyroidism   ? History of tobacco abuse   ? Ejection fraction   ? Dyslipidemia   ? ?Past Medical History:  ?Diagnosis Date  ? Arthritis   ? Brain TIA  02/26/2022  ? CAD (coronary artery disease)   ? Branch vessel and moderate RCA disease 2006  ? Cancer [facility]) 1990  ? Melanoma Lower right Leg  ? Carotid artery disease ([place])   ? Complication of anesthesia 2020  ? blood presure dropped when  started to put payient , blood preesure.  ? COPD (chronic obstructive pulmonary disease) ([place])   ? Essential hypertension   ? Glaucoma   ? Hyperlipidemia   ? Hypothyroidism   ? Lyme disease   ? PAD (peripheral artery disease) ([place])   ? Left common iliac stent 2004  ? Pneumonia   ? TIA (transient ischemic attack) 02/2022  ? Type 2 diabetes mellitus ([place])   ? Wears dentures   ? Wears glasses   ? ? ?Allergies as of 03/27/2022   ? ?   Reactions  ? Erythromycin Swelling  ? swelling of lips  ? Shellfish Allergy Rash, Other (See Comments)  ? Gout flares  ? Azithromycin Swelling  ? ?  ? ?  ?Medication List  ?  ? ?STOP taking these medications   ? ?amLODipine 10 MG tablet ?Commonly known as: NORVASC ?  ?telmisartan 80 MG tablet ?Commonly known as: MICARDIS ?  ? ?  ? ?TAKE these medications   ? ?aspirin EC 81 MG tablet ?Take 81 mg by mouth daily. Swallow whole. ?  ?atorvastatin 20 MG tablet ?Commonly known as: LIPITOR ?TAKE 1 TABLET BY MOUTH DAILY ?  ?clopidogrel 75 MG tablet ?Commonly known as: Plavix ?Take 1 tablet (75 mg total) by mouth daily. ?  ?colchicine 0.6 MG tablet ?Take 0.6 mg by mouth daily as needed (Gout). ?  ?cyclobenzaprine 10 MG tablet ?Commonly known as: FLEXERIL ?Take 10 mg by mouth every 8 (eight) hours as needed for muscle spasms. ?  ?cycloSPORINE 0.05 % ophthalmic emulsion ?Commonly known as: RESTASIS ?Place 1 drop into both eyes daily. ?  ?fluticasone 50 MCG/ACT nasal spray ?Commonly known as: FLONASE ?Place 2 sprays into both nostrils daily. ?  ?latanoprost 0.005 % ophthalmic solution ?Commonly known as: XALATAN ?Place 1 drop into the left eye at bedtime. ?  ?levothyroxine 175 MCG tablet ?Commonly known as: SYNTHROID ?Take 175 mcg by mouth See admin instructions.  Alternates taking 150 mg daily for 2 days then 175 mcg the third day then repeat ?  ?levothyroxine 150 MCG tablet ?Commonly known as: SYNTHROID ?Take 150 mcg by mouth See admin instructions. Alternates taking 150 mg daily for 2 days then 175 mcg the third day then repeat ?  ?metFORMIN 500 MG tablet ?Commonly known as: GLUCOPHAGE ?Take 500 mg by mouth daily. ?  ?mometasone 0.1 % ointment ?Commonly known as: ELOCON ?Apply 1 application. topically at bedtime. ?  ?naproxen sodium 220 MG tablet ?Commonly known as: ALEVE ?Take 220 mg by mouth daily as needed (for pain.). ?  ?nitroGLYCERIN 0.4 MG SL tablet ?Commonly known as: NITROSTAT ?  DISSOLVE 1 TABLET UNDER THE TONGUE EVERY 5 MINUTES AS NEEDED FOR CHEST PAIN. DO NOT EXCEED A TOTAL OF 3 DOSES IN 15 MINUTES. ?What changed: See the new instructions. ?  ?oxyCODONE-acetaminophen 5-325 MG tablet ?Commonly known as: PERCOCET/ROXICET ?Take 1 tablet by mouth every 6 (six) hours as needed for moderate pain. ?  ?prednisoLONE acetate 1 % ophthalmic suspension ?Commonly known as: PRED FORTE ?Place 1 drop into the left eye daily. ?  ?Prolensa 0.07 % Soln ?Generic drug: Bromfenac Sodium ?Apply 1 drop to eye in the morning, at noon, in the evening, and at bedtime. ?  ?tamsulosin 0.4 MG Caps capsule ?Commonly known as: FLOMAX ?Take 0.8 mg by mouth at bedtime. ?  ? ?  ? ? ? ?Discharge Instructions: ? ? ?[facility] ?Discharge Instructions Carotid Endarterectomy (CEA) ? ?Please refer to the following instructions for your post-procedure care. Your surgeon or physician assistant will discuss any changes with you. ? ?Activity ? ?You are encouraged to walk as much as you can. You can slowly return to normal activities but must avoid strenuous activity and heavy lifting until your doctor tell you it's OK. Avoid activities such as vacuuming or swinging a golf club. You can drive after one week if you are comfortable and you are no longer taking prescription pain  medications. It is normal to feel tired for serval weeks after your surgery. It is also normal to have difficulty with sleep habits, eating, and bowel movements after surgery. These will go away with time. ?

## 2022-03-30 ENCOUNTER — Other Ambulatory Visit: Payer: Self-pay

## 2022-03-30 ENCOUNTER — Inpatient Hospital Stay (HOSPITAL_COMMUNITY)
Admission: EM | Admit: 2022-03-30 | Discharge: 2022-04-03 | DRG: 291 | Disposition: A | Discharge status: Home / Self Care | Payer: Medicare Other | Attending: Internal Medicine | Admitting: Internal Medicine

## 2022-03-30 ENCOUNTER — Emergency Department (HOSPITAL_COMMUNITY): Payer: Medicare Other

## 2022-03-30 ENCOUNTER — Encounter (HOSPITAL_COMMUNITY): Payer: Self-pay | Admitting: *Deleted

## 2022-03-30 ENCOUNTER — Telehealth: Payer: Self-pay

## 2022-03-30 DIAGNOSIS — Z888 Allergy status to other drugs, medicaments and biological substances status: Secondary | ICD-10-CM

## 2022-03-30 DIAGNOSIS — R0902 Hypoxemia: Principal | ICD-10-CM

## 2022-03-30 DIAGNOSIS — Z87891 Personal history of nicotine dependence: Secondary | ICD-10-CM

## 2022-03-30 DIAGNOSIS — Z7982 Long term (current) use of aspirin: Secondary | ICD-10-CM

## 2022-03-30 DIAGNOSIS — I2510 Atherosclerotic heart disease of native coronary artery without angina pectoris: Secondary | ICD-10-CM | POA: Diagnosis not present

## 2022-03-30 DIAGNOSIS — Z96651 Presence of right artificial knee joint: Secondary | ICD-10-CM | POA: Diagnosis not present

## 2022-03-30 DIAGNOSIS — I25119 Atherosclerotic heart disease of native coronary artery with unspecified angina pectoris: Secondary | ICD-10-CM | POA: Diagnosis not present

## 2022-03-30 DIAGNOSIS — E1151 Type 2 diabetes mellitus with diabetic peripheral angiopathy without gangrene: Secondary | ICD-10-CM | POA: Diagnosis not present

## 2022-03-30 DIAGNOSIS — I5033 Acute on chronic diastolic (congestive) heart failure: Secondary | ICD-10-CM | POA: Diagnosis present

## 2022-03-30 DIAGNOSIS — Z7902 Long term (current) use of antithrombotics/antiplatelets: Secondary | ICD-10-CM

## 2022-03-30 DIAGNOSIS — Z85820 Personal history of malignant melanoma of skin: Secondary | ICD-10-CM | POA: Diagnosis not present

## 2022-03-30 DIAGNOSIS — R609 Edema, unspecified: Secondary | ICD-10-CM

## 2022-03-30 DIAGNOSIS — Z20822 Contact with and (suspected) exposure to covid-19: Secondary | ICD-10-CM | POA: Diagnosis present

## 2022-03-30 DIAGNOSIS — Z79890 Hormone replacement therapy (postmenopausal): Secondary | ICD-10-CM

## 2022-03-30 DIAGNOSIS — J90 Pleural effusion, not elsewhere classified: Secondary | ICD-10-CM | POA: Diagnosis not present

## 2022-03-30 DIAGNOSIS — E119 Type 2 diabetes mellitus without complications: Secondary | ICD-10-CM

## 2022-03-30 DIAGNOSIS — Z9842 Cataract extraction status, left eye: Secondary | ICD-10-CM

## 2022-03-30 DIAGNOSIS — J449 Chronic obstructive pulmonary disease, unspecified: Secondary | ICD-10-CM | POA: Diagnosis present

## 2022-03-30 DIAGNOSIS — Z79899 Other long term (current) drug therapy: Secondary | ICD-10-CM | POA: Diagnosis not present

## 2022-03-30 DIAGNOSIS — J9601 Acute respiratory failure with hypoxia: Secondary | ICD-10-CM | POA: Diagnosis not present

## 2022-03-30 DIAGNOSIS — J9811 Atelectasis: Secondary | ICD-10-CM | POA: Diagnosis not present

## 2022-03-30 DIAGNOSIS — Z8673 Personal history of transient ischemic attack (TIA), and cerebral infarction without residual deficits: Secondary | ICD-10-CM | POA: Diagnosis not present

## 2022-03-30 DIAGNOSIS — E785 Hyperlipidemia, unspecified: Secondary | ICD-10-CM | POA: Diagnosis not present

## 2022-03-30 DIAGNOSIS — I6523 Occlusion and stenosis of bilateral carotid arteries: Secondary | ICD-10-CM | POA: Diagnosis not present

## 2022-03-30 DIAGNOSIS — Z91013 Allergy to seafood: Secondary | ICD-10-CM | POA: Diagnosis not present

## 2022-03-30 DIAGNOSIS — R079 Chest pain, unspecified: Secondary | ICD-10-CM | POA: Diagnosis not present

## 2022-03-30 DIAGNOSIS — Z7984 Long term (current) use of oral hypoglycemic drugs: Secondary | ICD-10-CM

## 2022-03-30 DIAGNOSIS — G459 Transient cerebral ischemic attack, unspecified: Secondary | ICD-10-CM | POA: Diagnosis not present

## 2022-03-30 DIAGNOSIS — R600 Localized edema: Secondary | ICD-10-CM | POA: Diagnosis not present

## 2022-03-30 DIAGNOSIS — J9611 Chronic respiratory failure with hypoxia: Secondary | ICD-10-CM | POA: Diagnosis present

## 2022-03-30 DIAGNOSIS — H53121 Transient visual loss, right eye: Secondary | ICD-10-CM | POA: Diagnosis not present

## 2022-03-30 DIAGNOSIS — I110 Hypertensive heart disease with heart failure: Principal | ICD-10-CM | POA: Diagnosis present

## 2022-03-30 DIAGNOSIS — R0602 Shortness of breath: Secondary | ICD-10-CM | POA: Diagnosis not present

## 2022-03-30 DIAGNOSIS — Z8249 Family history of ischemic heart disease and other diseases of the circulatory system: Secondary | ICD-10-CM

## 2022-03-30 DIAGNOSIS — I10 Essential (primary) hypertension: Secondary | ICD-10-CM | POA: Diagnosis present

## 2022-03-30 DIAGNOSIS — M10072 Idiopathic gout, left ankle and foot: Secondary | ICD-10-CM | POA: Diagnosis not present

## 2022-03-30 DIAGNOSIS — E039 Hypothyroidism, unspecified: Secondary | ICD-10-CM | POA: Diagnosis present

## 2022-03-30 DIAGNOSIS — Z9841 Cataract extraction status, right eye: Secondary | ICD-10-CM

## 2022-03-30 DIAGNOSIS — E1169 Type 2 diabetes mellitus with other specified complication: Secondary | ICD-10-CM | POA: Diagnosis not present

## 2022-03-30 DIAGNOSIS — I779 Disorder of arteries and arterioles, unspecified: Secondary | ICD-10-CM | POA: Diagnosis not present

## 2022-03-30 LAB — BASIC METABOLIC PANEL
Anion gap: 7 (ref 5–15)
BUN: 13 mg/dL (ref 8–23)
CO2: 28 mmol/L (ref 22–32)
Calcium: 8.9 mg/dL (ref 8.9–10.3)
Chloride: 106 mmol/L (ref 98–111)
Creatinine, Ser: 0.77 mg/dL (ref 0.61–1.24)
GFR, Estimated: >60 mL/min (ref >60)
Glucose, Bld: 86 mg/dL (ref 70–99)
Potassium: 3.5 mmol/L (ref 3.5–5.1)
Sodium: 141 mmol/L (ref 135–145)

## 2022-03-30 LAB — CBC
HCT: 37 % — ABNORMAL LOW (ref 39.0–52.0)
Hemoglobin: 12.4 g/dL — ABNORMAL LOW (ref 13.0–17.0)
MCH: 30.5 pg (ref 26.0–34.0)
MCHC: 33.5 g/dL (ref 30.0–36.0)
MCV: 90.9 fL (ref 80.0–100.0)
Platelets: 133 10*3/uL — ABNORMAL LOW (ref 150–400)
RBC: 4.07 MIL/uL — ABNORMAL LOW (ref 4.22–5.81)
RDW: 13.5 % (ref 11.5–15.5)
WBC: 4.8 10*3/uL (ref 4.0–10.5)
nRBC: 0 % (ref 0.0–0.2)

## 2022-03-30 LAB — TROPONIN I (HIGH SENSITIVITY)
Troponin I (High Sensitivity): 6 ng/L (ref <18)
Troponin I (High Sensitivity): 10 ng/L (ref <18)

## 2022-03-30 LAB — BRAIN NATRIURETIC PEPTIDE: B Natriuretic Peptide: 172 pg/mL — ABNORMAL HIGH (ref 0.0–100.0)

## 2022-03-30 LAB — RESP PANEL BY RT-PCR (FLU A&B, COVID) ARPGX2
Influenza A by PCR: NEGATIVE
Influenza B by PCR: NEGATIVE
SARS Coronavirus 2 by RT PCR: NEGATIVE

## 2022-03-30 LAB — CBG MONITORING, ED: Glucose-Capillary: 101 mg/dL — ABNORMAL HIGH (ref 70–99)

## 2022-03-30 MED ORDER — ONDANSETRON HCL 4 MG PO TABS: 4.0000 mg | ORAL_TABLET | Freq: Four times a day (QID) | ORAL | Status: DC | PRN

## 2022-03-30 MED ORDER — LATANOPROST 0.005 % OP SOLN
1.0000 [drp] | Freq: Every day | OPHTHALMIC | Status: DC
  Administered 2022-03-31 – 2022-04-02 (×4): 1 [drp] via OPHTHALMIC
  Filled 2022-03-30 (×3): qty 2.5

## 2022-03-30 MED ORDER — ACETAMINOPHEN 325 MG PO TABS: 650.0000 mg | ORAL_TABLET | Freq: Four times a day (QID) | ORAL | Status: DC | PRN

## 2022-03-30 MED ORDER — ONDANSETRON HCL 4 MG/2ML IJ SOLN: 4.0000 mg | Freq: Four times a day (QID) | INTRAMUSCULAR | Status: DC | PRN

## 2022-03-30 MED ORDER — IOHEXOL 300 MG/ML  SOLN: 100.0000 mL | Freq: Once | INTRAMUSCULAR | Status: DC | PRN

## 2022-03-30 MED ORDER — PREDNISOLONE ACETATE 1 % OP SUSP
1.0000 [drp] | Freq: Every day | OPHTHALMIC | Status: DC
  Administered 2022-03-31 – 2022-04-03 (×4): 1 [drp] via OPHTHALMIC
  Filled 2022-03-30: qty 1

## 2022-03-30 MED ORDER — HEPARIN SODIUM (PORCINE) 5000 UNIT/ML IJ SOLN: 5000.0000 [IU] | Freq: Three times a day (TID) | INTRAMUSCULAR | Status: DC

## 2022-03-30 MED ORDER — ACETAMINOPHEN 650 MG RE SUPP: 650.0000 mg | Freq: Four times a day (QID) | RECTAL | Status: DC | PRN

## 2022-03-30 MED ORDER — ATORVASTATIN CALCIUM 20 MG PO TABS
20.0000 mg | ORAL_TABLET | Freq: Every day | ORAL | Status: DC
  Administered 2022-03-31 – 2022-04-03 (×4): 20 mg via ORAL
  Filled 2022-03-30 (×4): qty 1

## 2022-03-30 MED ORDER — CLOPIDOGREL BISULFATE 75 MG PO TABS
75.0000 mg | ORAL_TABLET | Freq: Every day | ORAL | Status: DC
  Administered 2022-03-31 – 2022-04-03 (×4): 75 mg via ORAL
  Filled 2022-03-30 (×4): qty 1

## 2022-03-30 MED ORDER — ASPIRIN EC 81 MG PO TBEC
81.0000 mg | DELAYED_RELEASE_TABLET | Freq: Every day | ORAL | Status: DC
  Administered 2022-03-31 – 2022-04-03 (×4): 81 mg via ORAL
  Filled 2022-03-30 (×4): qty 1

## 2022-03-30 MED ORDER — MORPHINE SULFATE (PF) 2 MG/ML IV SOLN: 2.0000 mg | INTRAVENOUS | Status: DC | PRN

## 2022-03-30 MED ORDER — FUROSEMIDE 10 MG/ML IJ SOLN
40.0000 mg | Freq: Once | INTRAMUSCULAR | Status: AC
  Administered 2022-03-30: 40 mg via INTRAVENOUS
  Filled 2022-03-30: qty 4

## 2022-03-30 MED ORDER — HEPARIN SODIUM (PORCINE) 5000 UNIT/ML IJ SOLN
5000.0000 [IU] | Freq: Three times a day (TID) | INTRAMUSCULAR | Status: DC
Start: 2022-03-31 — End: 2022-04-03
  Administered 2022-03-31 – 2022-04-03 (×10): 5000 [IU] via SUBCUTANEOUS
  Filled 2022-03-30 (×10): qty 1

## 2022-03-30 MED ORDER — CYCLOSPORINE 0.05 % OP EMUL
1.0000 [drp] | Freq: Every day | OPHTHALMIC | Status: DC
  Administered 2022-03-31 – 2022-04-03 (×4): 1 [drp] via OPHTHALMIC
  Filled 2022-03-30 (×4): qty 30

## 2022-03-30 MED ORDER — LEVOTHYROXINE SODIUM 75 MCG PO TABS: 175.0000 ug | ORAL_TABLET | ORAL | Status: DC

## 2022-03-30 MED ORDER — OXYCODONE-ACETAMINOPHEN 5-325 MG PO TABS: 1.0000 | ORAL_TABLET | Freq: Four times a day (QID) | ORAL | Status: DC | PRN

## 2022-03-30 MED ORDER — LEVOTHYROXINE SODIUM 75 MCG PO TABS: 150.0000 ug | ORAL_TABLET | ORAL | Status: DC

## 2022-03-30 MED ORDER — INSULIN ASPART 100 UNIT/ML IJ SOLN: 0.0000 [IU] | Freq: Every day | INTRAMUSCULAR | Status: DC

## 2022-03-30 MED ORDER — TAMSULOSIN HCL 0.4 MG PO CAPS
0.8000 mg | ORAL_CAPSULE | Freq: Every day | ORAL | Status: DC
  Administered 2022-04-02: 0.8 mg via ORAL
  Filled 2022-03-30 (×3): qty 2

## 2022-03-30 MED ORDER — INSULIN ASPART 100 UNIT/ML IJ SOLN
0.0000 [IU] | Freq: Three times a day (TID) | INTRAMUSCULAR | Status: DC
  Administered 2022-03-31 – 2022-04-02 (×2): 2 [IU] via SUBCUTANEOUS
  Administered 2022-04-03: 3 [IU] via SUBCUTANEOUS

## 2022-03-30 MED ORDER — IOHEXOL 350 MG/ML SOLN
75.0000 mL | Freq: Once | INTRAVENOUS | Status: AC | PRN
  Administered 2022-03-30: 75 mL via INTRAVENOUS

## 2022-03-30 NOTE — ED Notes (Signed)
Pt return from CT, wife at the bedside, NAD noted, pt updated on POC, no needs voiced at this time, A&Ox4 ?

## 2022-03-30 NOTE — ED Notes (Signed)
Patient transported to CT 

## 2022-03-30 NOTE — Telephone Encounter (Signed)
Patient called and said he had surgery last Wed. and released on Friday.  Having a problem with fluid retention in feet and legs.  Having a hard time catching his breath when he lies down, lacks energy.   ? ?Per Gwenette Greet, [place], Carotid surgery should not cause these issues.  Call PCP or to to the ER.  Patient said he will probably go to [facility] for evaluation. ?

## 2022-03-30 NOTE — ED Triage Notes (Signed)
Shortness of breath with swelling of legs, states he had carotid surgery last week ?

## 2022-03-30 NOTE — ED Notes (Signed)
Pt resting in bed, NAD noted, pt updated on POC, wife at bedside, no needs at this time, will continue care, safety in place, A&Ox4.  ?

## 2022-03-30 NOTE — ED Provider Notes (Signed)
?[facility] ?Provider Note ? ? ?CSN: [id number] ?Arrival date & time: 03/30/22  1655 ? ?  ? ?History ? ?Chief Complaint  ?Patient presents with  ? Shortness of Breath  ? ? ?Christopher Randolph is a 76 y.o. male. ? ?HPI ?Patient presents for evaluation of shortness of breath and swelling in his legs, since recent hospital discharge following carotid surgery.  He states he weighed himself before hospitalization on 03/25/2022, and today, and found he had gained 13 pounds.  Since then he has noticed swelling in his legs.  He denies prior cardiac problems or heart failure.  He has been eating and drinking normally.  On 03/25/2022 he had a right transcarotid arterectomy.  There were no complications and he was discharged on 03/27/2022.  He denies chest pain, focal weakness or paresthesia. ?  ? ?Home Medications ?Prior to Admission medications   ?Medication Sig Start Date End Date Taking? Authorizing Provider  ?aspirin EC 81 MG tablet Take 81 mg by mouth daily. Swallow whole.    [provider]  ?atorvastatin (LIPITOR) 20 MG tablet TAKE 1 TABLET BY MOUTH DAILY ?Patient taking differently: Take 20 mg by mouth daily. 12/03/21   Satira Sark, MD  ?Bromfenac Sodium (PROLENSA) 0.07 % SOLN Apply 1 drop to eye in the morning, at noon, in the evening, and at bedtime. ?Patient not taking: Reported on 03/19/2022 12/12/21   [name], MD  ?clopidogrel (PLAVIX) 75 MG tablet Take 1 tablet (75 mg total) by mouth daily. 02/28/22 02/28/23  Kathie Dike, MD  ?colchicine 0.6 MG tablet Take 0.6 mg by mouth daily as needed (Gout).    [provider]  ?cyclobenzaprine (FLEXERIL) 10 MG tablet Take 10 mg by mouth every 8 (eight) hours as needed for muscle spasms. 09/24/20   [provider]  ?cycloSPORINE (RESTASIS) 0.05 % ophthalmic emulsion Place 1 drop into both eyes daily.    [provider]  ?fluticasone (FLONASE) 50 MCG/ACT nasal spray Place 2 sprays into both nostrils daily.     [provider]  ?latanoprost (XALATAN) 0.005 % ophthalmic solution Place 1 drop into the left eye at bedtime. 02/11/22   [provider]  ?levothyroxine (SYNTHROID, LEVOTHROID) 150 MCG tablet Take 150 mcg by mouth See admin instructions. Alternates taking 150 mg daily for 2 days then 175 mcg the third day then repeat 11/01/18   [provider]  ?levothyroxine (SYNTHROID, LEVOTHROID) 175 MCG tablet Take 175 mcg by mouth See admin instructions. Alternates taking 150 mg daily for 2 days then 175 mcg the third day then repeat    [provider]  ?metFORMIN (GLUCOPHAGE) 500 MG tablet Take 500 mg by mouth daily. 10/19/14   [provider]  ?mometasone (ELOCON) 0.1 % ointment Apply 1 application. topically at bedtime.    [provider]  ?naproxen sodium (ANAPROX) 220 MG tablet Take 220 mg by mouth daily as needed (for pain.).     [provider]  ?nitroGLYCERIN (NITROSTAT) 0.4 MG SL tablet DISSOLVE 1 TABLET UNDER THE TONGUE EVERY 5 MINUTES AS NEEDED FOR CHEST PAIN. DO NOT EXCEED A TOTAL OF 3 DOSES IN 15 MINUTES. ?Patient taking differently: Place 0.4 mg under the tongue every 5 (five) minutes as needed for chest pain. 04/18/21   Satira Sark, MD  ?oxyCODONE-acetaminophen (PERCOCET/ROXICET) 5-325 MG tablet Take 1 tablet by mouth every 6 (six) hours as needed for moderate pain. 03/26/22   [name], PA-C  ?prednisoLONE acetate (PRED FORTE) 1 %  ophthalmic suspension Place 1 drop into the left eye daily. 02/11/22   [provider]  ?tamsulosin (FLOMAX) 0.4 MG CAPS capsule Take 0.8 mg by mouth at bedtime.    [provider]  ?   ? ?Allergies    ?Erythromycin, Shellfish allergy, and Azithromycin   ? ?Review of Systems   ?Review of Systems ? ?Physical Exam ?Updated Vital Signs ?BP (!) 164/72   Pulse 62   Temp 97.6 ?F (36.4 ?C) (Oral)   Resp (!) 26   Ht '5\' 8"'$  (1.727 m)   Wt 86.2 kg   SpO2 96%   BMI 28.89 kg/m?  ?Physical Exam ?Vitals  and nursing note reviewed.  ?Constitutional:   ?   General: He is not in acute distress. ?   Appearance: He is well-developed. He is obese. He is not ill-appearing or diaphoretic.  ?HENT:  ?   Head: Normocephalic and atraumatic.  ?   Right Ear: External ear normal.  ?   Left Ear: External ear normal.  ?Eyes:  ?   Conjunctiva/sclera: Conjunctivae normal.  ?   Pupils: Pupils are equal, round, and reactive to light.  ?Neck:  ?   Trachea: Phonation normal.  ?Cardiovascular:  ?   Rate and Rhythm: Normal rate and regular rhythm.  ?   Heart sounds: Normal heart sounds.  ?Pulmonary:  ?   Effort: Pulmonary effort is normal. No respiratory distress.  ?   Breath sounds: Normal breath sounds. No stridor. No wheezing or rhonchi.  ?Abdominal:  ?   Palpations: Abdomen is soft.  ?   Tenderness: There is no abdominal tenderness.  ?Musculoskeletal:     ?   General: No swelling or tenderness. Normal range of motion.  ?   Cervical back: Normal range of motion and neck supple.  ?   Right lower leg: Edema present.  ?   Left lower leg: No edema.  ?   Comments: 1-2+ lower leg edema bilaterally.  ?Skin: ?   General: Skin is warm and dry.  ?Neurological:  ?   Mental Status: He is alert and oriented to person, place, and time.  ?   Cranial Nerves: No cranial nerve deficit.  ?   Sensory: No sensory deficit.  ?   Motor: No abnormal muscle tone.  ?   Coordination: Coordination normal.  ?Psychiatric:     ?   Mood and Affect: Mood normal.     ?   Behavior: Behavior normal.     ?   Thought Content: Thought content normal.     ?   Judgment: Judgment normal.  ? ? ?ED Results / Procedures / Treatments   ?Labs ?(all labs ordered are listed, but only abnormal results are displayed) ?Labs Reviewed  ?CBC - Abnormal; Notable for the following components:  ?    Result Value  ? RBC 4.07 (*)   ? Hemoglobin 12.4 (*)   ? HCT 37.0 (*)   ? Platelets 133 (*)   ? All other components within normal limits  ?BRAIN NATRIURETIC PEPTIDE - Abnormal; Notable for the  following components:  ? B Natriuretic Peptide 172.0 (*)   ? All other components within normal limits  ?RESP PANEL BY RT-PCR (FLU A&B, COVID) [id number]  ?BASIC METABOLIC PANEL  ?TROPONIN I (HIGH SENSITIVITY)  ?TROPONIN I (HIGH SENSITIVITY)  ? ? ?EKG ?EKG Interpretation ? ?Date/Time:  Monday March 30 2022 17:23:36 EDT ?Ventricular Rate:  51 ?PR Interval:  157 ?QRS Duration: 96 ?QT Interval:  427 ?  QTC Calculation: 394 ?R Axis:   21 ?Text Interpretation: Sinus rhythm since last tracing no significant change Confirmed by Daleen Bo [id number]) on 03/30/2022 5:45:10 PM ? ?Radiology ?DG Chest 2 View ? ?Result Date: 03/30/2022 ?CLINICAL DATA:  Chest pain, shortness of breath EXAM: CHEST - 2 VIEW COMPARISON:  02/27/2022 FINDINGS: Cardiac size is within normal limits. There are no signs of pulmonary edema. There are linear densities in the lower lung fields with interval worsening on the left side. There is minimal blunting of right lateral CP angle. There is no pneumothorax. IMPRESSION: There are linear densities in both lower lung fields suggesting subsegmental atelectasis/pneumonia. Small right pleural effusion. Electronically Signed   By: Elmer Picker M.D.   On: 03/30/2022 18:01  ? ?CT Angio Chest PE W/Cm &/Or Wo Cm ? ?Result Date: 03/30/2022 ?CLINICAL DATA:  Shortness of breath EXAM: CT ANGIOGRAPHY CHEST WITH CONTRAST TECHNIQUE: Multidetector CT imaging of the chest was performed using the standard protocol during bolus administration of intravenous contrast. Multiplanar CT image reconstructions and MIPs were obtained to evaluate the vascular anatomy. RADIATION DOSE REDUCTION: This exam was performed according to the departmental dose-optimization program which includes automated exposure control, adjustment of the mA and/or kV according to patient size and/or use of iterative reconstruction technique. CONTRAST:  73m OMNIPAQUE IOHEXOL 350 MG/ML SOLN COMPARISON:  None. FINDINGS: Cardiovascular: Contrast injection is  sufficient to demonstrate satisfactory opacification of the pulmonary arteries to the segmental level. There is no pulmonary embolus or evidence of right heart strain. The size of the main pulmonary artery is normal. Heart s

## 2022-03-31 DIAGNOSIS — J9601 Acute respiratory failure with hypoxia: Secondary | ICD-10-CM | POA: Diagnosis not present

## 2022-03-31 DIAGNOSIS — E1169 Type 2 diabetes mellitus with other specified complication: Secondary | ICD-10-CM

## 2022-03-31 DIAGNOSIS — M10072 Idiopathic gout, left ankle and foot: Secondary | ICD-10-CM | POA: Diagnosis not present

## 2022-03-31 DIAGNOSIS — I10 Essential (primary) hypertension: Secondary | ICD-10-CM

## 2022-03-31 DIAGNOSIS — E039 Hypothyroidism, unspecified: Secondary | ICD-10-CM

## 2022-03-31 DIAGNOSIS — I6523 Occlusion and stenosis of bilateral carotid arteries: Secondary | ICD-10-CM | POA: Diagnosis not present

## 2022-03-31 DIAGNOSIS — I5033 Acute on chronic diastolic (congestive) heart failure: Secondary | ICD-10-CM | POA: Diagnosis not present

## 2022-03-31 DIAGNOSIS — E1151 Type 2 diabetes mellitus with diabetic peripheral angiopathy without gangrene: Secondary | ICD-10-CM | POA: Diagnosis not present

## 2022-03-31 DIAGNOSIS — E785 Hyperlipidemia, unspecified: Secondary | ICD-10-CM

## 2022-03-31 DIAGNOSIS — I779 Disorder of arteries and arterioles, unspecified: Secondary | ICD-10-CM | POA: Diagnosis not present

## 2022-03-31 DIAGNOSIS — H53121 Transient visual loss, right eye: Secondary | ICD-10-CM | POA: Diagnosis not present

## 2022-03-31 DIAGNOSIS — G459 Transient cerebral ischemic attack, unspecified: Secondary | ICD-10-CM | POA: Diagnosis not present

## 2022-03-31 DIAGNOSIS — I25119 Atherosclerotic heart disease of native coronary artery with unspecified angina pectoris: Secondary | ICD-10-CM | POA: Diagnosis not present

## 2022-03-31 LAB — MAGNESIUM: Magnesium: 1.6 mg/dL — ABNORMAL LOW (ref 1.7–2.4)

## 2022-03-31 LAB — COMPREHENSIVE METABOLIC PANEL
ALT: 27 U/L (ref 0–44)
AST: 23 U/L (ref 15–41)
Albumin: 3.9 g/dL (ref 3.5–5.0)
Alkaline Phosphatase: 50 U/L (ref 38–126)
Anion gap: 7 (ref 5–15)
BUN: 12 mg/dL (ref 8–23)
CO2: 31 mmol/L (ref 22–32)
Calcium: 8.8 mg/dL — ABNORMAL LOW (ref 8.9–10.3)
Chloride: 101 mmol/L (ref 98–111)
Creatinine, Ser: 0.89 mg/dL (ref 0.61–1.24)
GFR, Estimated: >60 mL/min (ref >60)
Glucose, Bld: 118 mg/dL — ABNORMAL HIGH (ref 70–99)
Potassium: 3.6 mmol/L (ref 3.5–5.1)
Sodium: 139 mmol/L (ref 135–145)
Total Bilirubin: 0.9 mg/dL (ref 0.3–1.2)
Total Protein: 6.9 g/dL (ref 6.5–8.1)

## 2022-03-31 LAB — GLUCOSE, CAPILLARY
Glucose-Capillary: 98 mg/dL (ref 70–99)
Glucose-Capillary: 130 mg/dL — ABNORMAL HIGH (ref 70–99)
Glucose-Capillary: 81 mg/dL (ref 70–99)
Glucose-Capillary: 107 mg/dL — ABNORMAL HIGH (ref 70–99)

## 2022-03-31 LAB — CBC
HCT: 37.6 % — ABNORMAL LOW (ref 39.0–52.0)
Hemoglobin: 12.7 g/dL — ABNORMAL LOW (ref 13.0–17.0)
MCH: 30.8 pg (ref 26.0–34.0)
MCHC: 33.8 g/dL (ref 30.0–36.0)
MCV: 91.3 fL (ref 80.0–100.0)
Platelets: 135 10*3/uL — ABNORMAL LOW (ref 150–400)
RBC: 4.12 MIL/uL — ABNORMAL LOW (ref 4.22–5.81)
RDW: 13.4 % (ref 11.5–15.5)
WBC: 5.2 10*3/uL (ref 4.0–10.5)
nRBC: 0 % (ref 0.0–0.2)

## 2022-03-31 LAB — TROPONIN I (HIGH SENSITIVITY): Troponin I (High Sensitivity): 6 ng/L (ref <18)

## 2022-03-31 LAB — TSH: TSH: 6.76 u[IU]/mL — ABNORMAL HIGH (ref 0.350–4.500)

## 2022-03-31 MED ORDER — LEVOTHYROXINE SODIUM 75 MCG PO TABS
150.0000 ug | ORAL_TABLET | ORAL | Status: DC
  Administered 2022-03-31 – 2022-04-02 (×2): 150 ug via ORAL
  Filled 2022-03-31 (×3): qty 2

## 2022-03-31 MED ORDER — LEVOTHYROXINE SODIUM 100 MCG PO TABS
175.0000 ug | ORAL_TABLET | ORAL | Status: DC
  Administered 2022-04-01: 175 ug via ORAL
  Filled 2022-03-31: qty 1

## 2022-03-31 MED ORDER — LISINOPRIL 5 MG PO TABS
2.5000 mg | ORAL_TABLET | Freq: Every day | ORAL | Status: DC
  Administered 2022-03-31 – 2022-04-03 (×4): 2.5 mg via ORAL
  Filled 2022-03-31 (×4): qty 1

## 2022-03-31 MED ORDER — MAGNESIUM SULFATE 4 GM/100ML IV SOLN
4.0000 g | Freq: Once | INTRAVENOUS | Status: AC
Start: 2022-03-31 — End: 2022-03-31
  Administered 2022-03-31: 4 g via INTRAVENOUS
  Filled 2022-03-31: qty 100

## 2022-03-31 MED ORDER — FUROSEMIDE 10 MG/ML IJ SOLN
40.0000 mg | Freq: Two times a day (BID) | INTRAMUSCULAR | Status: DC
  Administered 2022-03-31 – 2022-04-02 (×5): 40 mg via INTRAVENOUS
  Filled 2022-03-31 (×5): qty 4

## 2022-03-31 NOTE — Assessment & Plan Note (Addendum)
-   No oxygen requirement at home ?-Requiring 2 L nasal cannula to maintain oxygen saturations ?-secondary to CHF in setting of underlying COPD (80 pack years) ?-PE ruled out with CTA ?-Troponin normal x2 6, 10 ?-Negative respiratory panel ?-d/c on 2L [place] as he desaturates to 86% on RA ?

## 2022-03-31 NOTE — Progress Notes (Signed)
Patient ambulatory with extension for o2. Placed on bedside tele monitor. Vitals stable. Call bell within reach. ?

## 2022-03-31 NOTE — ED Notes (Signed)
Hospitalist at the bedside to consult pt for possible admission  ?

## 2022-03-31 NOTE — Plan of Care (Signed)

## 2022-03-31 NOTE — ED Notes (Signed)
Pt resting on bed, POC updated to pt, no concerns at this time, warm blanket given, lights off for comfort so pt may rest, wife went home. Care on going, safety in place, A&O x4.  ?

## 2022-03-31 NOTE — Progress Notes (Signed)
No tele monitor available. Reached out to charge nurse. ?

## 2022-03-31 NOTE — ED Notes (Signed)
Pt continues to rest, even and unlabored RR, VSS for pt, NAD noted, safety in place, pt currently waiting on bed assignment, care on-going.  ?

## 2022-03-31 NOTE — Assessment & Plan Note (Addendum)
Hold metformin>>restart after dc ?-02/28/22 hemoglobin A1c 6.1 ?-Sliding scale coverage ?

## 2022-03-31 NOTE — ED Notes (Signed)
A&O x 4. Resting quietly. No complaints. No signs of distress.  ?

## 2022-03-31 NOTE — Assessment & Plan Note (Addendum)
-   Continue Synthroid ?-Check TSH--6.760 ?-repeat TSH in 4 weeks ?

## 2022-03-31 NOTE — Assessment & Plan Note (Addendum)
-   Patient reports recently taken off of hypertensive medications ?-Started low-dose lisinopril for CHF and DM ?

## 2022-03-31 NOTE — Assessment & Plan Note (Signed)
Continue statin. 

## 2022-03-31 NOTE — Assessment & Plan Note (Addendum)
-   Last echo was done when patient was diagnosed with a TIA at the beginning of March ?-02/28/22 Echo EF 60 to 65% with grade 2 diastolic dysfunction ?-13 pound weight gain, peripheral edema, shortness of breath at arrival ?-Continue 40 mg IV Lasix twice daily>>lasix 40 po daily on 4/7 ?-Chest x-ray showed linear densities in both lower lung fields suggesting subsegmental atelectasis ?-CTA shows no pulmonary embolus or acute aortic syndrome.  Small right pleural effusion ?-Patient complains of orthopnea, dyspnea on exertion, peripheral edema, he has a pleural effusion ?-appreciate cardiology ?-NEG 17 lbs for the admission ?DC weight 172 lbs ?

## 2022-03-31 NOTE — Progress Notes (Signed)
ASSUMPTION OF CARE NOTE  ? ?03/31/2022 ?4:01 PM ? ?Christopher Randolph was seen and examined.  The H&P by the admitting provider, orders, imaging was reviewed.  Please see new orders.  Will continue to follow.  ? ? 76 y.o. male with medical history significant of with history of TIA, coronary artery disease, carotid artery disease status post endarterectomy on March 29, COPD, hypertension, hyperlipidemia, hypothyroidism, peripheral artery disease, type 2 diabetes mellitus, and more presents to ED with a chief complaint of shortness of breath.  Patient reports that he had been on and off of oxygen during his last hospitalization, but ultimately was discharged on room air.  Since that time he has been progressively more short of breath.  Its exertional.  Is reported orthopnea over the past 2 days, trying to sleep in his recliner chair.  He has an associated left chest discomfort.  He describes it as a small stabbing discomfort that lasts for minutes.  It can happen when he is resting or while he is exerting himself.  He has no associated nausea or diaphoresis.  He does have a dry cough.  He denies any fever.  His diastolic CHF was first seen on echo when he was diagnosed with a TIA.  His symptoms at that time were loss of vision in his right eye.  Patient has since been on aspirin, Plavix, statin.  He reports that he is compliant with all of his medications.  He is also compliant with a low-sodium diet.  Patient reports he had 13 pounds of weight gain since his carotid endarterectomy.  Patient has no other complaints at this time. ? ? ?Vitals:  ? 03/31/22 1138 03/31/22 1400  ?BP: (!) 142/70 117/84  ?Pulse: (!) 56 (!) 59  ?Resp: 15   ?Temp: 97.6 ?F (36.4 ?C) 98 ?F (36.7 ?C)  ?SpO2: 95% 94%  ? ? ?Results for orders placed or performed during the hospital encounter of 03/30/22  ?Resp Panel by RT-PCR (Flu A&B, Covid) Nasopharyngeal Swab  ? Specimen: Nasopharyngeal Swab; Nasopharyngeal(NP) swabs in vial transport medium   ?Result Value Ref Range  ? SARS Coronavirus 2 by RT PCR NEGATIVE NEGATIVE  ? Influenza A by PCR NEGATIVE NEGATIVE  ? Influenza B by PCR NEGATIVE NEGATIVE  ?Basic metabolic panel  ?Result Value Ref Range  ? Sodium 141 135 - 145 mmol/L  ? Potassium 3.5 3.5 - 5.1 mmol/L  ? Chloride 106 98 - 111 mmol/L  ? CO2 28 22 - 32 mmol/L  ? Glucose, Bld 86 70 - 99 mg/dL  ? BUN 13 8 - 23 mg/dL  ? Creatinine, Ser 0.77 0.61 - 1.24 mg/dL  ? Calcium 8.9 8.9 - 10.3 mg/dL  ? GFR, Estimated >60 >60 mL/min  ? Anion gap 7 5 - 15  ?CBC  ?Result Value Ref Range  ? WBC 4.8 4.0 - 10.5 K/uL  ? RBC 4.07 (L) 4.22 - 5.81 MIL/uL  ? Hemoglobin 12.4 (L) 13.0 - 17.0 g/dL  ? HCT 37.0 (L) 39.0 - 52.0 %  ? MCV 90.9 80.0 - 100.0 fL  ? MCH 30.5 26.0 - 34.0 pg  ? MCHC 33.5 30.0 - 36.0 g/dL  ? RDW 13.5 11.5 - 15.5 %  ? Platelets 133 (L) 150 - 400 K/uL  ? nRBC 0.0 0.0 - 0.2 %  ?Brain natriuretic peptide  ?Result Value Ref Range  ? B Natriuretic Peptide 172.0 (H) 0.0 - 100.0 pg/mL  ?Magnesium  ?Result Value Ref Range  ? Magnesium 1.6 (L)  1.7 - 2.4 mg/dL  ?CBC  ?Result Value Ref Range  ? WBC 5.2 4.0 - 10.5 K/uL  ? RBC 4.12 (L) 4.22 - 5.81 MIL/uL  ? Hemoglobin 12.7 (L) 13.0 - 17.0 g/dL  ? HCT 37.6 (L) 39.0 - 52.0 %  ? MCV 91.3 80.0 - 100.0 fL  ? MCH 30.8 26.0 - 34.0 pg  ? MCHC 33.8 30.0 - 36.0 g/dL  ? RDW 13.4 11.5 - 15.5 %  ? Platelets 135 (L) 150 - 400 K/uL  ? nRBC 0.0 0.0 - 0.2 %  ?Comprehensive metabolic panel  ?Result Value Ref Range  ? Sodium 139 135 - 145 mmol/L  ? Potassium 3.6 3.5 - 5.1 mmol/L  ? Chloride 101 98 - 111 mmol/L  ? CO2 31 22 - 32 mmol/L  ? Glucose, Bld 118 (H) 70 - 99 mg/dL  ? BUN 12 8 - 23 mg/dL  ? Creatinine, Ser 0.89 0.61 - 1.24 mg/dL  ? Calcium 8.8 (L) 8.9 - 10.3 mg/dL  ? Total Protein 6.9 6.5 - 8.1 g/dL  ? Albumin 3.9 3.5 - 5.0 g/dL  ? AST 23 15 - 41 U/L  ? ALT 27 0 - 44 U/L  ? Alkaline Phosphatase 50 38 - 126 U/L  ? Total Bilirubin 0.9 0.3 - 1.2 mg/dL  ? GFR, Estimated >60 >60 mL/min  ? Anion gap 7 5 - 15  ?TSH  ?Result Value Ref  Range  ? TSH 6.760 (H) 0.350 - 4.500 uIU/mL  ?Glucose, capillary  ?Result Value Ref Range  ? Glucose-Capillary 98 70 - 99 mg/dL  ?Glucose, capillary  ?Result Value Ref Range  ? Glucose-Capillary 130 (H) 70 - 99 mg/dL  ?CBG monitoring, ED  ?Result Value Ref Range  ? Glucose-Capillary 101 (H) 70 - 99 mg/dL  ?Troponin I (High Sensitivity)  ?Result Value Ref Range  ? Troponin I (High Sensitivity) 6 <18 ng/L  ?Troponin I (High Sensitivity)  ?Result Value Ref Range  ? Troponin I (High Sensitivity) 10 <18 ng/L  ?Troponin I (High Sensitivity)  ?Result Value Ref Range  ? Troponin I (High Sensitivity) 6 <18 ng/L  ? ?C. Wynetta Emery, MD ?[facility] ? ? 03/30/2022  5:07 PM ?How to contact the [facility] Attending or Consulting provider [place] or covering provider during after hours [place], for this patient?  ?Check the care team in [facility] and look for a) attending/consulting [facility] provider listed and b) the [facility] team listed ?Log into [email or web address] and use [place]'s universal password to access. If you do not have the password, please contact the hospital operator. ?Locate the [facility] provider you are looking for under [facility] and page to a number that you can be directly reached. ?If you still have difficulty reaching the provider, please page the [facility] (Director on Call) for the Hospitalists listed on amion for assistance. ? ?

## 2022-03-31 NOTE — Consult Note (Addendum)
?Cardiology Consultation:  ? ?Patient ID: Christopher Randolph ?MRN: [id number]; DOB: [birth year] ? ?Admit date: 03/30/2022 ?Date of Consult: 03/31/2022 ? ?PCP:  Curlene Labrum, MD ?  ?[facility] Providers ?Cardiologist:  Rozann Lesches, MD      ? ? ?Patient Profile:  ? ?Christopher Randolph is a 76 y.o. male with a hx of CAD, TIA who is being seen 03/31/2022 for the evaluation of CHF at the request of Dr. Wynetta Emery. ? ?History of Present Illness:  ? ?Christopher Randolph  is a 76 yo with with history of CAD moderate RCA disease in 2009, normal Myoview 04/2021.  Was seen in the hospital 02/2022 for chest pain no evidence of ACS.  Also had a suspected TIA with amaurosis fugax on the right and found to have 70% ICA stenosis status post right transcarotid revascularization 03/25/2022 on Plavix and Lipitor.  Also has hypertension, HLD and DM2.  2D echo that admission LVEF 60 to 65% with grade 2 DD. ? ?Patient now presents with worsening shortness of breath, orthopnea and 13 pound weight gain.  CTA negative for PE, requiring O2 at 2 L to maintain O2 sats, given Lasix 40 mg IV in the ER.  Patient said he was recently taken off hypertensive medications(amlodipine 10 mg and telmisartan 80 mg) after carotid surgery when blood pressure was running low.  He was told to hold until his blood pressure was greater than 120. He says he should have started it sooner as BP running over 160. He restarted yesterday. Eats a lot of canned food, tomato juice, and [place] food.  ? ? ?Past Medical History:  ?Diagnosis Date  ? Arthritis   ? Brain TIA 02/26/2022  ? CAD (coronary artery disease)   ? [place] vessel and moderate RCA disease 2006  ? Cancer [facility]) 1990  ? Melanoma Lower right Leg  ? Carotid artery disease ([place])   ? Complication of anesthesia 2020  ? blood presure dropped when  started to put payient , blood preesure.  ? COPD (chronic obstructive pulmonary disease) ([place])   ? Essential hypertension   ? Glaucoma   ? Hyperlipidemia   ? Hypothyroidism   ? Lyme  disease   ? PAD (peripheral artery disease) ([place])   ? Left common iliac stent 2004  ? Pneumonia   ? TIA (transient ischemic attack) 02/2022  ? Type 2 diabetes mellitus ([place])   ? Wears dentures   ? Wears glasses   ? ? ?Past Surgical History:  ?Procedure Laterality Date  ? CATARACT EXTRACTION Bilateral   ? CATARACT EXTRACTION, BILATERAL    ? CERVICAL DISC SURGERY    ? x 1  ? COLONOSCOPY N/A 11/06/2016  ? Procedure: COLONOSCOPY;  Surgeon: Daneil Dolin, MD;  [facility];  Service: Endoscopy;  Laterality: N/A;  7:30 AM  ? COLONOSCOPY  2012  ? Dr. Anthony Sar: normal. reviewed reports, which states he has a history of polyps in remote past.   ? ELBOW SURGERY Left   ? EYE SURGERY Bilateral   ? Cat Sx  ? EYE SURGERY Left 01/14/2021  ? Shunt - Dr. Jovita Kussmaul  ? IRIDOTOMY / IRIDECTOMY Left 01/14/2021  ? Shunt - Dr. Jovita Kussmaul  ? JOINT REPLACEMENT    ? LUMBAR WOUND DEBRIDEMENT N/A 01/24/2019  ? Procedure: LUMBAR WOUND Exploration for Evacation of Seroma vs. Hematoma;  Surgeon: Jovita Gamma, MD;  Location: [place];  Service: Neurosurgery;  Laterality: N/A;  ? MELANOMA EXCISION    ? right leg,  seen at [facility] and underwent immunotherapy  ? NECK SURGERY    ?  x 1 yrs ago  ? PARS PLANA VITRECTOMY Left 04/04/2020  ? Procedure: PARS PLANA VITRECTOMY WITH 25G REMOVAL/SUTURE INTRAOCULAR LENS;  Surgeon: [name], MD;  Location: [place];  Service: Ophthalmology;  Laterality: Left;  ? REPLACEMENT TOTAL KNEE Right   ? TRANSCAROTID ARTERY REVASCULARIZATION?  Right 03/25/2022  ? Procedure: Right Transcarotid Artery Revascularization;  Surgeon: Serafina Mitchell, MD;  Location: [facility] OR;  Service: Vascular;  Laterality: Right;  ? WISDOM TOOTH EXTRACTION    ?  ? ?Home Medications:  ?Prior to Admission medications   ?Medication Sig Start Date End Date Taking? Authorizing Provider  ?aspirin EC 81 MG tablet Take 81 mg by mouth daily. Swallow whole.   Yes [provider]  ?atorvastatin (LIPITOR) 20 MG tablet TAKE 1 TABLET BY MOUTH  DAILY ?Patient taking differently: Take 20 mg by mouth daily. 12/03/21  Yes Satira Sark, MD  ?clopidogrel (PLAVIX) 75 MG tablet Take 1 tablet (75 mg total) by mouth daily. 02/28/22 02/28/23 Yes Kathie Dike, MD  ?colchicine 0.6 MG tablet Take 0.6 mg by mouth daily as needed (Gout).   Yes [provider]  ?cyclobenzaprine (FLEXERIL) 10 MG tablet Take 10 mg by mouth every 8 (eight) hours as needed for muscle spasms. 09/24/20  Yes [provider]  ?cycloSPORINE (RESTASIS) 0.05 % ophthalmic emulsion Place 1 drop into both eyes daily.   Yes [provider]  ?fluticasone (FLONASE) 50 MCG/ACT nasal spray Place 2 sprays into both nostrils daily.   Yes [provider]  ?latanoprost (XALATAN) 0.005 % ophthalmic solution Place 1 drop into the left eye at bedtime. 02/11/22  Yes [provider]  ?levothyroxine (SYNTHROID, LEVOTHROID) 150 MCG tablet Take 150 mcg by mouth See admin instructions. Alternates taking 150 mg daily for 2 days then 175 mcg the third day then repeat 11/01/18  Yes [provider]  ?levothyroxine (SYNTHROID, LEVOTHROID) 175 MCG tablet Take 175 mcg by mouth See admin instructions. Alternates taking 150 mg daily for 2 days then 175 mcg the third day then repeat   Yes [provider]  ?metFORMIN (GLUCOPHAGE) 500 MG tablet Take 500 mg by mouth daily. 10/19/14  Yes [provider]  ?mometasone (ELOCON) 0.1 % ointment Apply 1 application. topically at bedtime.   Yes [provider]  ?naproxen sodium (ANAPROX) 220 MG tablet Take 220 mg by mouth daily as needed (for pain.).    Yes [provider]  ?nitroGLYCERIN (NITROSTAT) 0.4 MG SL tablet DISSOLVE 1 TABLET UNDER THE TONGUE EVERY 5 MINUTES AS NEEDED FOR CHEST PAIN. DO NOT EXCEED A TOTAL OF 3 DOSES IN 15 MINUTES. ?Patient taking differently: Place 0.4 mg under the tongue every 5 (five) minutes as needed for chest pain. 04/18/21  Yes Satira Sark, MD  ?prednisoLONE  acetate (PRED FORTE) 1 % ophthalmic suspension Place 1 drop into the left eye daily. 02/11/22  Yes [provider]  ?tamsulosin (FLOMAX) 0.4 MG CAPS capsule Take 0.8 mg by mouth at bedtime.   Yes [provider]  ?Bromfenac Sodium (PROLENSA) 0.07 % SOLN Apply 1 drop to eye in the morning, at noon, in the evening, and at bedtime. ?Patient not taking: Reported on 03/19/2022 12/12/21   [name], MD  ?oxyCODONE-acetaminophen (PERCOCET/ROXICET) 5-325 MG tablet Take 1 tablet by mouth every 6 (six) hours as needed for moderate pain. ?Patient not taking: Reported on 03/31/2022 03/26/22   [name], PA-C  ? ? ?  Inpatient Medications: ?Scheduled Meds: ? aspirin EC  81 mg Oral Daily  ? atorvastatin  20 mg Oral Daily  ? clopidogrel  75 mg Oral Daily  ? cycloSPORINE  1 drop Both Eyes Daily  ? furosemide  40 mg Intravenous Q12H  ? heparin  5,000 Units Subcutaneous Q8H  ? insulin aspart  0-15 Units Subcutaneous TID WC  ? insulin aspart  0-5 Units Subcutaneous QHS  ? latanoprost  1 drop Left Eye QHS  ? levothyroxine  150 mcg Oral See admin instructions  ? levothyroxine  175 mcg Oral See admin instructions  ? lisinopril  2.5 mg Oral Daily  ? prednisoLONE acetate  1 drop Left Eye Daily  ? tamsulosin  0.8 mg Oral QHS  ? ?Continuous Infusions: ? ?PRN Meds: ?acetaminophen **OR** acetaminophen, morphine injection, ondansetron **OR** ondansetron (ZOFRAN) IV, oxyCODONE-acetaminophen ? ?Allergies:    ?Allergies  ?Allergen Reactions  ? Erythromycin Swelling  ?  swelling of lips  ? Shellfish Allergy Rash and Other (See Comments)  ?  Gout flares  ? Azithromycin Swelling  ? ? ?Social History:   ?Social History  ? ?Socioeconomic History  ? Marital status: Married  ?  Spouse name: Detta  ? Number of children: Not on file  ? Years of education: Not on file  ? Highest education level: Not on file  ?Occupational History  ? Not on file  ?Tobacco Use  ? Smoking status: Former  ?  Packs/day: 1.50  ?  Years: 40.00  ?  Pack years:  60.00  ?  Types: Cigarettes  ?  Start date: 11/04/1963  ?  Quit date: 10/29/2003  ?  Years since quitting: 18.4  ? Smokeless tobacco: Never  ?Vaping Use  ? Vaping Use: Never used  ?Substance and Sexual Act

## 2022-03-31 NOTE — Hospital Course (Addendum)
76 y.o. male with medical history significant of with history of TIA, coronary artery disease, carotid artery disease status post endarterectomy on March 29, COPD, hypertension, hyperlipidemia, hypothyroidism, peripheral artery disease, type 2 diabetes mellitus, and more presents to ED with a chief complaint of shortness of breath.  Patient reports that he had been on and off of oxygen during his last hospitalization, but ultimately was discharged on room air.  Since that time he has been progressively more short of breath.  Its exertional.  Is reported orthopnea over the past 2 days, trying to sleep in his recliner chair.  He has an associated left chest discomfort.  He describes it as a small stabbing discomfort that lasts for minutes.  It can happen when he is resting or while he is exerting himself.  He has no associated nausea or diaphoresis.  He does have a dry cough.  He denies any fever.  His diastolic CHF was first seen on echo when he was diagnosed with a TIA.  His symptoms at that time were loss of vision in his right eye.  Patient has since been on aspirin, Plavix, statin.  He reports that he is compliant with all of his medications.  He is also compliant with a low-sodium diet.  Patient reports he had 13 pounds of weight gain since his carotid endarterectomy.  Patient has no other complaints at this time.  The patient was started on IV furosemide 40 mg IV twice daily with clinical improvement.  He was -17 pounds from admission.  His discharge weight was 172 pounds.  He will go home with furosemide 40 mg  po daily ?

## 2022-03-31 NOTE — ED Notes (Signed)
Pt continues to rest, even-unlabored RR, NAD noted, safety in place, care on going, currently waiting on inpatient bed ?

## 2022-03-31 NOTE — ED Notes (Signed)
Pt resting with eyes closed, remains on cardiac monitor, SB noted, even RR, VSS. Skin warm and dry, NAD noted, pt has been made notified of the delay in admission d/t bed status, pt verbalized understanding. POC continues, safety in place, will continue to monitor.  ?

## 2022-03-31 NOTE — H&P (Signed)
?History and Physical  ? ? ?Patient: Christopher Randolph QAS:[id number] DOB: August 28, 1946 ?DOA: 03/30/2022 ?DOS: the patient was seen and examined on 03/31/2022 ?PCP: Curlene Labrum, MD  ?Patient coming from: Home ? ?Chief Complaint:  ?Chief Complaint  ?Patient presents with  ? Shortness of Breath  ? ?HPI: Christopher Randolph is a 76 y.o. male with medical history significant of with history of TIA, coronary artery disease, carotid artery disease status post endarterectomy on March 29, COPD, hypertension, hyperlipidemia, hypothyroidism, peripheral artery disease, type 2 diabetes mellitus, and more presents to ED with a chief complaint of shortness of breath.  Patient reports that he had been on and off of oxygen during his last hospitalization, but ultimately was discharged on room air.  Since that time he has been progressively more short of breath.  Its exertional.  Is reported orthopnea over the past 2 days, trying to sleep in his recliner chair.  He has an associated left chest discomfort.  He describes it as a small stabbing discomfort that lasts for minutes.  It can happen when he is resting or while he is exerting himself.  He has no associated nausea or diaphoresis.  He does have a dry cough.  He denies any fever.  His diastolic CHF was first seen on echo when he was diagnosed with a TIA.  His symptoms at that time were loss of vision in his right eye.  Patient has since been on aspirin, Plavix, statin.  He reports that he is compliant with all of his medications.  He is also compliant with a low-sodium diet.  Patient reports he had 13 pounds of weight gain since his carotid endarterectomy.  Patient has no other complaints at this time. ?Review of Systems: unable to review all systems due to the inability of the patient to answer questions. ?Past Medical History:  ?Diagnosis Date  ? Arthritis   ? Brain TIA 02/26/2022  ? CAD (coronary artery disease)   ? [place] vessel and moderate RCA disease 2006  ? Cancer [facility]) 1990   ? Melanoma Lower right Leg  ? Carotid artery disease ([place])   ? Complication of anesthesia 2020  ? blood presure dropped when  started to put payient , blood preesure.  ? COPD (chronic obstructive pulmonary disease) ([place])   ? Essential hypertension   ? Glaucoma   ? Hyperlipidemia   ? Hypothyroidism   ? Lyme disease   ? PAD (peripheral artery disease) ([place])   ? Left common iliac stent 2004  ? Pneumonia   ? TIA (transient ischemic attack) 02/2022  ? Type 2 diabetes mellitus ([place])   ? Wears dentures   ? Wears glasses   ? ?Past Surgical History:  ?Procedure Laterality Date  ? CATARACT EXTRACTION Bilateral   ? CATARACT EXTRACTION, BILATERAL    ? CERVICAL DISC SURGERY    ? x 1  ? COLONOSCOPY N/A 11/06/2016  ? Procedure: COLONOSCOPY;  Surgeon: Daneil Dolin, MD;  [facility];  Service: Endoscopy;  Laterality: N/A;  7:30 AM  ? COLONOSCOPY  2012  ? Dr. Anthony Sar: normal. reviewed reports, which states he has a history of polyps in remote past.   ? ELBOW SURGERY Left   ? EYE SURGERY Bilateral   ? Cat Sx  ? EYE SURGERY Left 01/14/2021  ? Shunt - Dr. Jovita Kussmaul  ? IRIDOTOMY / IRIDECTOMY Left 01/14/2021  ? Shunt - Dr. Jovita Kussmaul  ? JOINT REPLACEMENT    ? LUMBAR WOUND DEBRIDEMENT  N/A 01/24/2019  ? Procedure: LUMBAR WOUND Exploration for Evacation of Seroma vs. Hematoma;  Surgeon: Jovita Gamma, MD;  Location: [place];  Service: Neurosurgery;  Laterality: N/A;  ? MELANOMA EXCISION    ? right leg, seen at [facility] and underwent immunotherapy  ? NECK SURGERY    ?  x 1 yrs ago  ? PARS PLANA VITRECTOMY Left 04/04/2020  ? Procedure: PARS PLANA VITRECTOMY WITH 25G REMOVAL/SUTURE INTRAOCULAR LENS;  Surgeon: [name], MD;  Location: [place];  Service: Ophthalmology;  Laterality: Left;  ? REPLACEMENT TOTAL KNEE Right   ? TRANSCAROTID ARTERY REVASCULARIZATION?  Right 03/25/2022  ? Procedure: Right Transcarotid Artery Revascularization;  Surgeon: Serafina Mitchell, MD;  Location: [facility] OR;  Service: Vascular;  Laterality: Right;  ?  WISDOM TOOTH EXTRACTION    ? ?Social History:  reports that he quit smoking about 18 years ago. His smoking use included cigarettes. He started smoking about 58 years ago. He has a 60.00 pack-year smoking history. He has never used smokeless tobacco. He reports current alcohol use of about 15.0 standard drinks per week. He reports that he does not use drugs. ? ?Allergies  ?Allergen Reactions  ? Erythromycin Swelling  ?  swelling of lips  ? Shellfish Allergy Rash and Other (See Comments)  ?  Gout flares  ? Azithromycin Swelling  ? ? ?Family History  ?Problem Relation Age of Onset  ? Heart disease Mother   ? Heart attack Mother   ? Colon polyps Mother   ? Colon polyps Brother   ? Heart attack Maternal Grandmother   ? Heart attack Maternal Grandfather   ? Colon cancer Neg Hx   ? ? ?Prior to Admission medications   ?Medication Sig Start Date End Date Taking? Authorizing Provider  ?aspirin EC 81 MG tablet Take 81 mg by mouth daily. Swallow whole.    [provider]  ?atorvastatin (LIPITOR) 20 MG tablet TAKE 1 TABLET BY MOUTH DAILY ?Patient taking differently: Take 20 mg by mouth daily. 12/03/21   Satira Sark, MD  ?Bromfenac Sodium (PROLENSA) 0.07 % SOLN Apply 1 drop to eye in the morning, at noon, in the evening, and at bedtime. ?Patient not taking: Reported on 03/19/2022 12/12/21   [name], MD  ?clopidogrel (PLAVIX) 75 MG tablet Take 1 tablet (75 mg total) by mouth daily. 02/28/22 02/28/23  Kathie Dike, MD  ?colchicine 0.6 MG tablet Take 0.6 mg by mouth daily as needed (Gout).    [provider]  ?cyclobenzaprine (FLEXERIL) 10 MG tablet Take 10 mg by mouth every 8 (eight) hours as needed for muscle spasms. 09/24/20   [provider]  ?cycloSPORINE (RESTASIS) 0.05 % ophthalmic emulsion Place 1 drop into both eyes daily.    [provider]  ?fluticasone (FLONASE) 50 MCG/ACT nasal spray Place 2 sprays into both nostrils daily.    [provider]  ?latanoprost (XALATAN)  0.005 % ophthalmic solution Place 1 drop into the left eye at bedtime. 02/11/22   [provider]  ?levothyroxine (SYNTHROID, LEVOTHROID) 150 MCG tablet Take 150 mcg by mouth See admin instructions. Alternates taking 150 mg daily for 2 days then 175 mcg the third day then repeat 11/01/18   [provider]  ?levothyroxine (SYNTHROID, LEVOTHROID) 175 MCG tablet Take 175 mcg by mouth See admin instructions. Alternates taking 150 mg daily for 2 days then 175 mcg the third day then repeat    [provider]  ?metFORMIN (GLUCOPHAGE) 500 MG tablet Take 500 mg by  mouth daily. 10/19/14   [provider]  ?mometasone (ELOCON) 0.1 % ointment Apply 1 application. topically at bedtime.    [provider]  ?naproxen sodium (ANAPROX) 220 MG tablet Take 220 mg by mouth daily as needed (for pain.).     [provider]  ?nitroGLYCERIN (NITROSTAT) 0.4 MG SL tablet DISSOLVE 1 TABLET UNDER THE TONGUE EVERY 5 MINUTES AS NEEDED FOR CHEST PAIN. DO NOT EXCEED A TOTAL OF 3 DOSES IN 15 MINUTES. ?Patient taking differently: Place 0.4 mg under the tongue every 5 (five) minutes as needed for chest pain. 04/18/21   Satira Sark, MD  ?oxyCODONE-acetaminophen (PERCOCET/ROXICET) 5-325 MG tablet Take 1 tablet by mouth every 6 (six) hours as needed for moderate pain. 03/26/22   [name], PA-C  ?prednisoLONE acetate (PRED FORTE) 1 % ophthalmic suspension Place 1 drop into the left eye daily. 02/11/22   [provider]  ?tamsulosin (FLOMAX) 0.4 MG CAPS capsule Take 0.8 mg by mouth at bedtime.    [provider]  ? ? ?Physical Exam: ?Vitals:  ? 03/31/22 0300 03/31/22 0330 03/31/22 0400 03/31/22 0430  ?BP: 137/66 129/69 136/63 134/71  ?Pulse: (!) 51 (!) 58 (!) 55 69  ?Resp:    18  ?Temp:      ?TempSrc:      ?SpO2: 96% 95% 96% 94%  ?Weight:      ?Height:      ? ?1.  General: ?Patient lying supine in bed,  no acute distress ?  ?2. Psychiatric: ?Alert and oriented x 3, mood and  behavior normal for situation, pleasant and cooperative with exam ?  ?3. Neurologic: ?Speech and language are normal, face is symmetric, moves all 4 extremities voluntarily, at baseline without acute def

## 2022-04-01 LAB — GLUCOSE, CAPILLARY
Glucose-Capillary: 87 mg/dL (ref 70–99)
Glucose-Capillary: 96 mg/dL (ref 70–99)
Glucose-Capillary: 116 mg/dL — ABNORMAL HIGH (ref 70–99)
Glucose-Capillary: 101 mg/dL — ABNORMAL HIGH (ref 70–99)

## 2022-04-01 LAB — BASIC METABOLIC PANEL
Anion gap: 9 (ref 5–15)
BUN: 17 mg/dL (ref 8–23)
CO2: 34 mmol/L — ABNORMAL HIGH (ref 22–32)
Calcium: 8.7 mg/dL — ABNORMAL LOW (ref 8.9–10.3)
Chloride: 97 mmol/L — ABNORMAL LOW (ref 98–111)
Creatinine, Ser: 0.93 mg/dL (ref 0.61–1.24)
GFR, Estimated: >60 mL/min (ref >60)
Glucose, Bld: 101 mg/dL — ABNORMAL HIGH (ref 70–99)
Potassium: 3.5 mmol/L (ref 3.5–5.1)
Sodium: 140 mmol/L (ref 135–145)

## 2022-04-01 LAB — MAGNESIUM: Magnesium: 2.4 mg/dL (ref 1.7–2.4)

## 2022-04-01 MED ORDER — ACETAMINOPHEN 500 MG PO TABS
1000.0000 mg | ORAL_TABLET | Freq: Once | ORAL | Status: AC
  Administered 2022-04-01: 1000 mg via ORAL
  Filled 2022-04-01: qty 2

## 2022-04-01 NOTE — Assessment & Plan Note (Signed)
repleted ?

## 2022-04-01 NOTE — Progress Notes (Signed)
? ?Progress Note ? ?Patient Name: Christopher Randolph ?Date of Encounter: 04/01/2022 ? ?[facility] Cardiologist: Rozann Lesches, MD  ? ?Subjective  ? ?SOB improving but not resolved.  ? ?Inpatient Medications  ?  ?Scheduled Meds: ? aspirin EC  81 mg Oral Daily  ? atorvastatin  20 mg Oral Daily  ? clopidogrel  75 mg Oral Daily  ? cycloSPORINE  1 drop Both Eyes Daily  ? furosemide  40 mg Intravenous Q12H  ? heparin  5,000 Units Subcutaneous Q8H  ? insulin aspart  0-15 Units Subcutaneous TID WC  ? insulin aspart  0-5 Units Subcutaneous QHS  ? latanoprost  1 drop Left Eye QHS  ? levothyroxine  150 mcg Oral Once per day on Tue Thu Fri  ? levothyroxine  175 mcg Oral Once per day on Wed Sat  ? lisinopril  2.5 mg Oral Daily  ? prednisoLONE acetate  1 drop Left Eye Daily  ? tamsulosin  0.8 mg Oral QHS  ? ?Continuous Infusions: ? ?PRN Meds: ?acetaminophen **OR** acetaminophen, morphine injection, ondansetron **OR** ondansetron (ZOFRAN) IV, oxyCODONE-acetaminophen  ? ?Vital Signs  ?  ?Vitals:  ? 03/31/22 1647 03/31/22 2043 04/01/22 0600 04/01/22 0615  ?BP: 128/76 (!) 156/73  113/65  ?Pulse: (!) 58 (!) 56  62  ?Resp: 16 (!) 22  16  ?Temp: 97.9 ?F (36.6 ?C) (!) 97.4 ?F (36.3 ?C)    ?TempSrc:  Oral    ?SpO2: 95% 96%  94%  ?Weight:   79.4 kg   ?Height:   '5\' 8"'$  (1.727 m)   ? ? ?Intake/Output Summary (Last 24 hours) at 04/01/2022 0903 ?Last data filed at 04/01/2022 0300 ?Gross per 24 hour  ?Intake 720 ml  ?Output 2000 ml  ?Net -1280 ml  ? ? ?  04/01/2022  ?  6:00 AM 03/30/2022  ?  5:05 PM 03/25/2022  ?  1:57 PM  ?Last 3 Weights  ?Weight (lbs) 175 lb 0.7 oz 190 lb 194 lb 14.2 oz  ?Weight (kg) 79.4 kg 86.183 kg 88.4 kg  ?   ? ?Telemetry  ?  ?SR - Personally Reviewed ? ?ECG  ?  ?N/a - Personally Reviewed ? ?Physical Exam  ? ?GEN: No acute distress.   ?Neck: No JVD ?Cardiac: RRR, no murmurs, rubs, or gallops.  ?Respiratory: Clear to auscultation bilaterally. ?GI: Soft, nontender, non-distended  ?MS: No edema; No deformity. ?Neuro:  Nonfocal   ?Psych: Normal affect  ? ?Labs  ?  ?High Sensitivity Troponin:   ?Recent Labs  ?Lab 03/30/22 ?1734 03/30/22 ?1915 03/31/22 ?0530  ?TROPONINIHS '6 10 6  '$ ?   ?Chemistry ?Recent Labs  ?Lab 03/30/22 ?1734 03/31/22 ?8453 04/01/22 ?6468  ?NA 141 139 140  ?K 3.5 3.6 3.5  ?CL 106 101 97*  ?CO2 28 31 34*  ?GLUCOSE 86 118* 101*  ?BUN '13 12 17  '$ ?CREATININE 0.77 0.89 0.93  ?CALCIUM 8.9 8.8* 8.7*  ?MG  --  1.6* 2.4  ?PROT  --  6.9  --   ?ALBUMIN  --  3.9  --   ?AST  --  23  --   ?ALT  --  27  --   ?ALKPHOS  --  50  --   ?BILITOT  --  0.9  --   ?GFRNONAA >60 >60 >60  ?ANIONGAP '7 7 9  '$ ?  ?Lipids  ?Recent Labs  ?Lab 03/26/22 ?0127  ?CHOL 88  ?TRIG 61  ?HDL 31*  ?[place] 45  ?CHOLHDL 2.8  ?  ?Hematology ?Recent  Labs  ?Lab 03/27/22 ?0118 03/30/22 ?1734 03/31/22 ?9379  ?WBC 5.9 4.8 5.2  ?RBC 3.46* 4.07* 4.12*  ?HGB 10.5* 12.4* 12.7*  ?HCT 32.1* 37.0* 37.6*  ?MCV 92.8 90.9 91.3  ?MCH 30.3 30.5 30.8  ?MCHC 32.7 33.5 33.8  ?RDW 13.3 13.5 13.4  ?PLT 116* 133* 135*  ? ?Thyroid  ?Recent Labs  ?Lab 03/31/22 ?0530  ?TSH 6.760*  ?  ?BNP ?Recent Labs  ?Lab 03/30/22 ?1734  ?BNP 172.0*  ?  ?DDimer No results for input(s): DDIMER in the last 168 hours.  ? ?Radiology  ?  ?DG Chest 2 View ? ?Result Date: 03/30/2022 ?CLINICAL DATA:  Chest pain, shortness of breath EXAM: CHEST - 2 VIEW COMPARISON:  02/27/2022 FINDINGS: Cardiac size is within normal limits. There are no signs of pulmonary edema. There are linear densities in the lower lung fields with interval worsening on the left side. There is minimal blunting of right lateral CP angle. There is no pneumothorax. IMPRESSION: There are linear densities in both lower lung fields suggesting subsegmental atelectasis/pneumonia. Small right pleural effusion. Electronically Signed   By: Elmer Picker M.D.   On: 03/30/2022 18:01  ? ?CT Angio Chest PE W/Cm &/Or Wo Cm ? ?Result Date: 03/30/2022 ?CLINICAL DATA:  Shortness of breath EXAM: CT ANGIOGRAPHY CHEST WITH CONTRAST TECHNIQUE: Multidetector CT imaging  of the chest was performed using the standard protocol during bolus administration of intravenous contrast. Multiplanar CT image reconstructions and MIPs were obtained to evaluate the vascular anatomy. RADIATION DOSE REDUCTION: This exam was performed according to the departmental dose-optimization program which includes automated exposure control, adjustment of the mA and/or kV according to patient size and/or use of iterative reconstruction technique. CONTRAST:  59m OMNIPAQUE IOHEXOL 350 MG/ML SOLN COMPARISON:  None. FINDINGS: Cardiovascular: Contrast injection is sufficient to demonstrate satisfactory opacification of the pulmonary arteries to the segmental level. There is no pulmonary embolus or evidence of right heart strain. The size of the main pulmonary artery is normal. Heart size is normal, with no pericardial effusion. The course and caliber of the aorta are normal. There is atherosclerotic calcification. No acute aortic syndrome. Mediastinum/Nodes: No mediastinal, hilar or axillary lymphadenopathy. Normal visualized thyroid. Thoracic esophageal course is normal. Lungs/Pleura: Small right pleural effusion basilar atelectasis. Upper Abdomen: Contrast bolus timing is not optimized for evaluation of the abdominal organs. The visualized portions of the organs of the upper abdomen are normal. Musculoskeletal: No chest wall abnormality. No bony spinal canal stenosis. Review of the MIP images confirms the above findings. IMPRESSION: 1. No pulmonary embolus or acute aortic syndrome. 2. Small right pleural effusion with basilar atelectasis. Aortic atherosclerosis (ICD10-I70.0). Electronically Signed   By: KUlyses JarredM.D.   On: 03/30/2022 20:33   ? ?Cardiac Studies  ? ? ?Patient Profile  ?   ?Christopher LBERNHARD KOSKINENis a 76y.o. male with a hx of CAD, TIA who is being seen 03/31/2022 for the evaluation of CHF at the request of Dr. JWynetta Emery ? ?Assessment & Plan  ?  ?1.Acute on chronic HFpEF ?02/2022 echo: LVEF 60-65%,  grade II dd, normal RV ?- BNP 172, CXR linear densities both lower lung fields ?- neg 1 L yesterday, neg 4 L since admission.He is on IV lasix '40mg'$  bid, mild uptrend in Cr but still within normal limts.  ? ?- Surgery last week, from records received normal saline and lactated ringers that admission, had some issues with low bp's intraop and postop. Suspect sodium loaded at that point and symptoms progressed  from there at discharge.  ? ?- remains fluid overloaded, continue IV diuresis. Possibly can change to oral tomorrow.  ? ? ?For questions or updates, please contact [place] ?Please consult [email or web address] for contact info under  ? ?  ?   ?Signed, ?Carlyle Dolly, MD  ?04/01/2022, 9:03 AM   ? ?

## 2022-04-01 NOTE — Progress Notes (Addendum)
PROGRESS NOTE  KAYTON ZOLA XBJ:[id number] DOB: 03-03-1946 DOA: 03/30/2022 PCP: Juliette Alcide, MD  Brief History:   76 y.o. male with medical history significant of with history of TIA, coronary artery disease, carotid artery disease status post endarterectomy on March 29, COPD, hypertension, hyperlipidemia, hypothyroidism, peripheral artery disease, type 2 diabetes mellitus, and more presents to ED with a chief complaint of shortness of breath.  Patient reports that he had been on and off of oxygen during his last hospitalization, but ultimately was discharged on room air.  Since that time he has been progressively more short of breath.  Its exertional.  Is reported orthopnea over the past 2 days, trying to sleep in his recliner chair.  He has an associated left chest discomfort.  He describes it as a small stabbing discomfort that lasts for minutes.  It can happen when he is resting or while he is exerting himself.  He has no associated nausea or diaphoresis.  He does have a dry cough.  He denies any fever.  His diastolic CHF was first seen on echo when he was diagnosed with a TIA.  His symptoms at that time were loss of vision in his right eye.  Patient has since been on aspirin, Plavix, statin.  He reports that he is compliant with all of his medications.  He is also compliant with a low-sodium diet.  Patient reports he had 13 pounds of weight gain since his carotid endarterectomy.  Patient has no other complaints at this time.     Assessment and Plan: * Acute respiratory failure with hypoxia (HCC) - No oxygen requirement at home -Requiring 2 L nasal cannula to maintain oxygen saturations -secondary to CHF in setting of underlying COPD (80 pack years) -PE ruled out with CTA -Troponin normal x2 6, 10 -Negative respiratory panel  Acute on chronic diastolic CHF (congestive heart failure) (HCC) - Last echo was done when patient was diagnosed with a TIA at the beginning of  March -02/28/22 Echo EF 60 to 65% with grade 2 diastolic dysfunction -13 pound weight gain, peripheral edema, shortness of breath at arrival -Continue 40 mg IV Lasix twice daily -Chest x-ray showed linear densities in both lower lung fields suggesting subsegmental atelectasis -CTA shows no pulmonary embolus or acute aortic syndrome.  Small right pleural effusion -Patient complains of orthopnea, dyspnea on exertion, peripheral edema, he has a pleural effusion -appreciate cardiology -Fluid restrictions, strict intake and output  Hypomagnesemia repleted  DMII (diabetes mellitus, type 2) (HCC) Hold metformin -02/28/22 hemoglobin A1c 6.1 -Sliding scale coverage  Dyslipidemia - Continue statin  Hypothyroidism - Continue Synthroid -Check TSH  Hypertension - Patient reports recently taken off of hypertensive medications -Started low-dose lisinopril for CHF        Family Communication:   spouse updated at bedside 4/5  Consultants: cardiology   Code Status:  FULL   DVT Prophylaxis:  [place] Heparin    Procedures: As Listed in Progress Note Above  Antibiotics: None       Subjective: Patient denies fevers, chills, headache, chest pain, dyspnea, nausea, vomiting, diarrhea, abdominal pain, dysuria, hematuria, hematochezia, and melena.   Objective: Vitals:   04/01/22 0600 04/01/22 0615 04/01/22 1006 04/01/22 1349  BP:  113/65 112/68 118/61  Pulse:  62  62  Resp:  16    Temp:    97.8 F (36.6 C)  TempSrc:    Oral  SpO2:  94%  92%  Weight: 79.4 kg     Height: 5\' 8"  (1.727 m)       Intake/Output Summary (Last 24 hours) at 04/01/2022 1828 Last data filed at 04/01/2022 1417 Gross per 24 hour  Intake 720 ml  Output 1100 ml  Net -380 ml   Weight change: -6.784 kg Exam:  General:  Pt is alert, follows commands appropriately, not in acute distress HEENT: No icterus, No thrush, No neck mass, [place]/AT Cardiovascular: RRR, S1/S2, no rubs, no gallops Respiratory: bibasilar  crackles. No wheeze Abdomen: Soft/+BS, non tender, non distended, no guarding Extremities: No edema, No lymphangitis, No petechiae, No rashes, no synovitis   Data Reviewed: I have personally reviewed following labs and imaging studies Basic Metabolic Panel: Recent Labs  Lab 03/26/22 0127 03/30/22 1734 03/31/22 0343 04/01/22 0524  NA 138 141 139 140  K 5.0 3.5 3.6 3.5  CL 108 106 101 97*  CO2 22 28 31  34*  GLUCOSE 147* 86 118* 101*  BUN 19 13 12 17   CREATININE 1.03 0.77 0.89 0.93  CALCIUM 7.8* 8.9 8.8* 8.7*  MG  --   --  1.6* 2.4   Liver Function Tests: Recent Labs  Lab 03/31/22 0343  AST 23  ALT 27  ALKPHOS 50  BILITOT 0.9  PROT 6.9  ALBUMIN 3.9   No results for input(s): LIPASE, AMYLASE in the last 168 hours. No results for input(s): AMMONIA in the last 168 hours. Coagulation Profile: No results for input(s): INR, PROTIME in the last 168 hours. CBC: Recent Labs  Lab 03/26/22 0127 03/27/22 0118 03/30/22 1734 03/31/22 0343  WBC 6.0 5.9 4.8 5.2  HGB 11.1* 10.5* 12.4* 12.7*  HCT 33.0* 32.1* 37.0* 37.6*  MCV 91.4 92.8 90.9 91.3  PLT 113* 116* 133* 135*   Cardiac Enzymes: No results for input(s): CKTOTAL, CKMB, CKMBINDEX, TROPONINI in the last 168 hours. BNP: Invalid input(s): POCBNP CBG: Recent Labs  Lab 03/31/22 1609 03/31/22 2153 04/01/22 0818 04/01/22 1143 04/01/22 1617  GLUCAP 81 107* 87 96 116*   HbA1C: No results for input(s): HGBA1C in the last 72 hours. Urine analysis:    Component Value Date/Time   COLORURINE YELLOW 03/25/2022 0655   APPEARANCEUR CLEAR 03/25/2022 0655   LABSPEC 1.023 03/25/2022 0655   PHURINE 5.0 03/25/2022 0655   GLUCOSEU NEGATIVE 03/25/2022 0655   HGBUR NEGATIVE 03/25/2022 0655   BILIRUBINUR NEGATIVE 03/25/2022 0655   KETONESUR NEGATIVE 03/25/2022 0655   PROTEINUR NEGATIVE 03/25/2022 0655   UROBILINOGEN 1.0 06/23/2010 0752   NITRITE NEGATIVE 03/25/2022 0655   LEUKOCYTESUR NEGATIVE 03/25/2022 0655   Sepsis  Labs: @LABRCNTIP (procalcitonin:4,lacticidven:4) ) Recent Results (from the past 240 hour(s))  Surgical PCR Screen     Status: None   Collection Time: 03/25/22  6:35 AM   Specimen: Nasal Mucosa; Nasal Swab  Result Value Ref Range Status   MRSA, PCR NEGATIVE NEGATIVE Final   Staphylococcus aureus NEGATIVE NEGATIVE Final    Comment: (NOTE) The Xpert SA Assay (FDA approved for NASAL specimens in patients 62 years of age and older), is one component of a comprehensive surveillance program. It is not intended to diagnose infection nor to guide or monitor treatment. Performed at [facility], [address], [place], [place] [address]   Resp Panel by RT-PCR (Flu A&B, Covid) Nasopharyngeal Swab     Status: None   Collection Time: 03/30/22  7:49 PM   Specimen: Nasopharyngeal Swab; Nasopharyngeal(NP) swabs in vial transport medium  Result Value Ref Range Status   SARS Coronavirus  2 by RT PCR NEGATIVE NEGATIVE Final    Comment: (NOTE) SARS-CoV-2 target nucleic acids are NOT DETECTED.  The SARS-CoV-2 RNA is generally detectable in upper respiratory specimens during the acute phase of infection. The lowest concentration of SARS-CoV-2 viral copies this assay can detect is 138 copies/mL. A negative result does not preclude SARS-Cov-2 infection and should not be used as the sole basis for treatment or other patient management decisions. A negative result may occur with  improper specimen collection/handling, submission of specimen other than nasopharyngeal swab, presence of viral mutation(s) within the areas targeted by this assay, and inadequate number of viral copies(<138 copies/mL). A negative result must be combined with clinical observations, patient history, and epidemiological information. The expected result is Negative.  Fact Sheet for Patients:  BloggerCourse.com  Fact Sheet for Healthcare Providers:  SeriousBroker.it  This  test is no t yet approved or cleared by the [place] FDA and  has been authorized for detection and/or diagnosis of SARS-CoV-2 by FDA under an Emergency Use Authorization (EUA). This EUA will remain  in effect (meaning this test can be used) for the duration of the COVID-19 declaration under Section 564(b)(1) of the Act, 21 U.S.C.section 360bbb-3(b)(1), unless the authorization is terminated  or revoked sooner.       Influenza A by PCR NEGATIVE NEGATIVE Final   Influenza B by PCR NEGATIVE NEGATIVE Final    Comment: (NOTE) The Xpert Xpress SARS-CoV-2/FLU/RSV plus assay is intended as an aid in the diagnosis of influenza from Nasopharyngeal swab specimens and should not be used as a sole basis for treatment. Nasal washings and aspirates are unacceptable for Xpert Xpress SARS-CoV-2/FLU/RSV testing.  Fact Sheet for Patients: BloggerCourse.com  Fact Sheet for Healthcare Providers: SeriousBroker.it  This test is not yet approved or cleared by the [place] FDA and has been authorized for detection and/or diagnosis of SARS-CoV-2 by FDA under an Emergency Use Authorization (EUA). This EUA will remain in effect (meaning this test can be used) for the duration of the COVID-19 declaration under Section 564(b)(1) of the Act, 21 U.S.C. section 360bbb-3(b)(1), unless the authorization is terminated or revoked.  Performed at [facility], [address] [place], [place] [address]      Scheduled Meds:  aspirin EC  81 mg Oral Daily   atorvastatin  20 mg Oral Daily   clopidogrel  75 mg Oral Daily   cycloSPORINE  1 drop Both Eyes Daily   furosemide  40 mg Intravenous Q12H   heparin  5,000 Units Subcutaneous Q8H   insulin aspart  0-15 Units Subcutaneous TID WC   insulin aspart  0-5 Units Subcutaneous QHS   latanoprost  1 drop Left Eye QHS   levothyroxine  150 mcg Oral Once per day on Tue Thu Fri   levothyroxine  175 mcg Oral Once  per day on Wed Sat   lisinopril  2.5 mg Oral Daily   prednisoLONE acetate  1 drop Left Eye Daily   tamsulosin  0.8 mg Oral QHS   Continuous Infusions:  Procedures/Studies: DG Chest 2 View  Result Date: 03/30/2022 CLINICAL DATA:  Chest pain, shortness of breath EXAM: CHEST - 2 VIEW COMPARISON:  02/27/2022 FINDINGS: Cardiac size is within normal limits. There are no signs of pulmonary edema. There are linear densities in the lower lung fields with interval worsening on the left side. There is minimal blunting of right lateral CP angle. There is no pneumothorax. IMPRESSION: There are linear densities in both lower lung fields suggesting  subsegmental atelectasis/pneumonia. Small right pleural effusion. Electronically Signed   By: Ernie Avena M.D.   On: 03/30/2022 18:01   CT Angio Chest PE W/Cm &/Or Wo Cm  Result Date: 03/30/2022 CLINICAL DATA:  Shortness of breath EXAM: CT ANGIOGRAPHY CHEST WITH CONTRAST TECHNIQUE: Multidetector CT imaging of the chest was performed using the standard protocol during bolus administration of intravenous contrast. Multiplanar CT image reconstructions and MIPs were obtained to evaluate the vascular anatomy. RADIATION DOSE REDUCTION: This exam was performed according to the departmental dose-optimization program which includes automated exposure control, adjustment of the mA and/or kV according to patient size and/or use of iterative reconstruction technique. CONTRAST:  75mL OMNIPAQUE IOHEXOL 350 MG/ML SOLN COMPARISON:  None. FINDINGS: Cardiovascular: Contrast injection is sufficient to demonstrate satisfactory opacification of the pulmonary arteries to the segmental level. There is no pulmonary embolus or evidence of right heart strain. The size of the main pulmonary artery is normal. Heart size is normal, with no pericardial effusion. The course and caliber of the aorta are normal. There is atherosclerotic calcification. No acute aortic syndrome. Mediastinum/Nodes: No  mediastinal, hilar or axillary lymphadenopathy. Normal visualized thyroid. Thoracic esophageal course is normal. Lungs/Pleura: Small right pleural effusion basilar atelectasis. Upper Abdomen: Contrast bolus timing is not optimized for evaluation of the abdominal organs. The visualized portions of the organs of the upper abdomen are normal. Musculoskeletal: No chest wall abnormality. No bony spinal canal stenosis. Review of the MIP images confirms the above findings. IMPRESSION: 1. No pulmonary embolus or acute aortic syndrome. 2. Small right pleural effusion with basilar atelectasis. Aortic atherosclerosis (ICD10-I70.0). Electronically Signed   By: Deatra Robinson M.D.   On: 03/30/2022 20:33   Structural Heart Procedure  Result Date: 03/25/2022 See surgical note for result.  HYBRID OR IMAGING ([facility] ONLY)  Result Date: 03/25/2022 There is no interpretation for this exam.  This order is for images obtained during a surgical procedure.  Please See "Surgeries" Tab for more information regarding the procedure.    Catarina Hartshorn, DO  [facility]  If 7PM-7AM, please contact night-coverage [email or web address] Password [email or web address] 04/01/2022, 6:28 PM   LOS: 2 days

## 2022-04-01 NOTE — TOC Initial Note (Signed)
Transition of Care (TOC) - Initial/Assessment Note  ? ? ?Patient Details  ?Name: Christopher Randolph ?MRN: [id number] ?Date of Birth: 11/29/1946 ? ?Transition of Care (TOC) CM/SW Contact:    ?Michaeline Eckersley D, LCSW ?Phone Number: ?04/01/2022, 5:13 PM ? ?Clinical Narrative:                 ?Patient from home with spouse. Admitted for acute respoiratory failure with hypoxia. Consulted for heart failure home screen. Spouse states "normally we eat like we want to, we realize that's gonna have to change." TOC will continue to follow and address needs as they arise.  ? ?Expected Discharge Plan: Home/Self Care ?Barriers to Discharge: Continued Medical Work up ? ? ?Patient Goals and CMS Choice ?Patient states their goals for this hospitalization and ongoing recovery are:: return home ?  ?  ? ?Expected Discharge Plan and Services ?Expected Discharge Plan: Home/Self Care ?  ?  ?  ?Living arrangements for the past 2 months: [place] ?                ?  ?  ?  ?  ?  ?  ?  ?  ?  ?  ? ?Prior Living Arrangements/Services ?Living arrangements for the past 2 months: [place] ?Lives with:: Spouse ?  ?       ?  ?  ?  ?  ? ?Activities of Daily Living ?Home Assistive Devices/Equipment: None ?ADL Screening (condition at time of admission) ?Patient's cognitive ability adequate to safely complete daily activities?: Yes ?Is the patient deaf or have difficulty hearing?: No ?Does the patient have difficulty seeing, even when wearing glasses/contacts?: No ?Does the patient have difficulty concentrating, remembering, or making decisions?: No ?Patient able to express need for assistance with ADLs?: Yes ?Does the patient have difficulty dressing or bathing?: No ?Independently performs ADLs?: Yes (appropriate for developmental age) ?Does the patient have difficulty walking or climbing stairs?: No ?Weakness of Legs: Both ?Weakness of Arms/Hands: None ? ?Permission Sought/Granted ?  ?  ?   ?   ?   ?   ? ?Emotional Assessment ?  ?  ?  ?   ?  ?  ? ?Admission diagnosis:  Peripheral edema [R60.9] ?Hypoxia [R09.02] ?Acute respiratory failure with hypoxia ([place]) [J96.01] ?Patient Active Problem List  ? Diagnosis Date Noted  ? Acute on chronic diastolic CHF (congestive heart failure) ([place]) 03/31/2022  ? Acute respiratory failure with hypoxia ([place]) 03/30/2022  ? DMII (diabetes mellitus, type 2) ([place]) 03/30/2022  ? Carotid stenosis 03/25/2022  ? TIA (transient ischemic attack) 02/27/2022  ? Biceps tendon rupture, proximal, right, initial encounter 05/16/2020  ? 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 Heme positive stool 10/30/2016  ? Systolic murmur   ? Dizziness   ? Carotid artery disease ([place])   ? PAD (peripheral artery disease) ([place])   ? CAD (coronary artery disease)   ? Hypertension   ? Hypothyroidism   ? History of tobacco abuse   ? Ejection fraction   ? Dyslipidemia   ? ?PCP:  Curlene Labrum, MD ?Pharmacy:   ?[facility] [address] ?[place] ?[place] [address] ?Phone: [phone] Fax: [phone] ? ?[facility] ?[address] ?[place] [address] ?Phone: [phone] Fax: [phone] ? ? ? ? ?Social Determinants of Health (SDOH) Interventions ?  ? ?Readmission Risk Interventions ? ?  03/26/2022  ? 10:24 AM  ?  Readmission Risk Prevention Plan  ?Post Dischage Appt Complete  ?Medication Screening Complete  ?Transportation Screening Complete  ? ? ? ?

## 2022-04-02 DIAGNOSIS — I2510 Atherosclerotic heart disease of native coronary artery without angina pectoris: Secondary | ICD-10-CM

## 2022-04-02 LAB — BASIC METABOLIC PANEL
Anion gap: 9 (ref 5–15)
BUN: 20 mg/dL (ref 8–23)
CO2: 34 mmol/L — ABNORMAL HIGH (ref 22–32)
Calcium: 8.8 mg/dL — ABNORMAL LOW (ref 8.9–10.3)
Chloride: 96 mmol/L — ABNORMAL LOW (ref 98–111)
Creatinine, Ser: 1.11 mg/dL (ref 0.61–1.24)
GFR, Estimated: >60 mL/min (ref >60)
Glucose, Bld: 96 mg/dL (ref 70–99)
Potassium: 3.6 mmol/L (ref 3.5–5.1)
Sodium: 139 mmol/L (ref 135–145)

## 2022-04-02 LAB — GLUCOSE, CAPILLARY
Glucose-Capillary: 91 mg/dL (ref 70–99)
Glucose-Capillary: 146 mg/dL — ABNORMAL HIGH (ref 70–99)
Glucose-Capillary: 127 mg/dL — ABNORMAL HIGH (ref 70–99)

## 2022-04-02 LAB — MAGNESIUM: Magnesium: 2.1 mg/dL (ref 1.7–2.4)

## 2022-04-02 MED ORDER — LEVOTHYROXINE SODIUM 75 MCG PO TABS: 150.0000 ug | ORAL_TABLET | ORAL | Status: DC

## 2022-04-02 MED ORDER — LEVOTHYROXINE SODIUM 75 MCG PO TABS: 175.0000 ug | ORAL_TABLET | ORAL | Status: DC

## 2022-04-02 MED ORDER — LEVOTHYROXINE SODIUM 75 MCG PO TABS
150.0000 ug | ORAL_TABLET | ORAL | Status: DC
  Administered 2022-04-03: 150 ug via ORAL
  Filled 2022-04-02: qty 2

## 2022-04-02 MED ORDER — FUROSEMIDE 10 MG/ML IJ SOLN: 40.0000 mg | Freq: Two times a day (BID) | INTRAMUSCULAR | Status: DC

## 2022-04-02 MED ORDER — FUROSEMIDE 40 MG PO TABS
40.0000 mg | ORAL_TABLET | Freq: Every day | ORAL | Status: DC
  Administered 2022-04-03: 40 mg via ORAL
  Filled 2022-04-02: qty 1

## 2022-04-02 NOTE — Progress Notes (Signed)
?  ?       ?PROGRESS NOTE ? ?Christopher Randolph WCB:[id number] DOB: December 10, 1946 DOA: 03/30/2022 ?PCP: Curlene Labrum, MD ? ?Brief History:  ? 76 y.o. male with medical history significant of with history of TIA, coronary artery disease, carotid artery disease status post endarterectomy on March 29, COPD, hypertension, hyperlipidemia, hypothyroidism, peripheral artery disease, type 2 diabetes mellitus, and more presents to ED with a chief complaint of shortness of breath.  Patient reports that he had been on and off of oxygen during his last hospitalization, but ultimately was discharged on room air.  Since that time he has been progressively more short of breath.  Its exertional.  Is reported orthopnea over the past 2 days, trying to sleep in his recliner chair.  He has an associated left chest discomfort.  He describes it as a small stabbing discomfort that lasts for minutes.  It can happen when he is resting or while he is exerting himself.  He has no associated nausea or diaphoresis.  He does have a dry cough.  He denies any fever.  His diastolic CHF was first seen on echo when he was diagnosed with a TIA.  His symptoms at that time were loss of vision in his right eye.  Patient has since been on aspirin, Plavix, statin.  He reports that he is compliant with all of his medications.  He is also compliant with a low-sodium diet.  Patient reports he had 13 pounds of weight gain since his carotid endarterectomy.  Patient has no other complaints at this time.  ? ? ?Assessment and Plan: ?* Acute respiratory failure with hypoxia (HCC) ?- No oxygen requirement at home ?-Requiring 2 L nasal cannula to maintain oxygen saturations ?-secondary to CHF in setting of underlying COPD (80 pack years) ?-PE ruled out with CTA ?-Troponin normal x2 6, 10 ?-Negative respiratory panel ?-will need to d/c on 2L [place] ? ?Acute on chronic diastolic CHF (congestive heart failure) ([place]) ?- Last echo was done when patient was diagnosed with a TIA  at the beginning of March ?-02/28/22 Echo EF 60 to 65% with grade 2 diastolic dysfunction ?-13 pound weight gain, peripheral edema, shortness of breath at arrival ?-Continue 40 mg IV Lasix twice daily>>lasix 40 po daily on 4/7 ?-Chest x-ray showed linear densities in both lower lung fields suggesting subsegmental atelectasis ?-CTA shows no pulmonary embolus or acute aortic syndrome.  Small right pleural effusion ?-Patient complains of orthopnea, dyspnea on exertion, peripheral edema, he has a pleural effusion ?-appreciate cardiology ?-NEG 17 lbs for the admission ? ?Hypomagnesemia ?repleted ? ?DMII (diabetes mellitus, type 2) ([place]) ?Hold metformin>>restart after dc ?-02/28/22 hemoglobin A1c 6.1 ?-Sliding scale coverage ? ?Dyslipidemia ?- Continue statin ? ?Hypothyroidism ?- Continue Synthroid ?-Check TSH--6.760 ?-repeat TSH in 4 weeks ? ?Hypertension ?- Patient reports recently taken off of hypertensive medications ?-Started low-dose lisinopril for CHF ? ? ? ? ?Family Communication:   spouse updated at bedside 4/6 ?  ?Consultants: cardiology  ?  ?Code Status:  FULL  ?  ?DVT Prophylaxis:  [place] Heparin  ?  ?  ?Procedures: ?As Listed in Progress Note Above ?  ?Antibiotics: ?None ?  ? ? ? ? ? ? ? ?Subjective: ?Patient denies fevers, chills, headache, chest pain, dyspnea, nausea, vomiting, diarrhea, abdominal pain, dysuria, hematuria, hematochezia, and melena. ? ? ?Objective: ?Vitals:  ? 04/02/22 0522 04/02/22 0600 04/02/22 1300 04/02/22 1354  ?BP: (!) 111/51  (!) 108/55 (!) 108/55  ?Pulse: (!) 57  65 65  ?  Resp: '16  20 20  '$ ?Temp: 97.9 ?F (36.6 ?C)  99 ?F (37.2 ?C) 99 ?F (37.2 ?C)  ?TempSrc: Oral  Oral Oral  ?SpO2: 94%  94% 94%  ?Weight:  78.5 kg    ?Height:  '5\' 8"'$  (1.727 m)    ? ? ?Intake/Output Summary (Last 24 hours) at 04/02/2022 1759 ?Last data filed at 04/02/2022 1355 ?Gross per 24 hour  ?Intake 240 ml  ?Output 1900 ml  ?Net -1660 ml  ? ?Weight change: -0.9 kg ?Exam: ? ?General:  Pt is alert, follows commands appropriately,  not in acute distress ?HEENT: No icterus, No thrush, No neck mass, [place]/AT ?Cardiovascular: RRR, S1/S2, no rubs, no gallops ?Respiratory: diminished BS.  Bibasilar rales. No wheeze ?Abdomen: Soft/+BS, non tender, non distended, no guarding ?Extremities: No edema, No lymphangitis, No petechiae, No rashes, no synovitis ? ? ?Data Reviewed: ?I have personally reviewed following labs and imaging studies ?Basic Metabolic Panel: ?Recent Labs  ?Lab 03/30/22 ?1734 03/31/22 ?7425 04/01/22 ?9563 04/02/22 ?8756  ?NA 141 139 140 139  ?K 3.5 3.6 3.5 3.6  ?CL 106 101 97* 96*  ?CO2 28 31 34* 34*  ?GLUCOSE 86 118* 101* 96  ?BUN '13 12 17 20  '$ ?CREATININE 0.77 0.89 0.93 1.11  ?CALCIUM 8.9 8.8* 8.7* 8.8*  ?MG  --  1.6* 2.4 2.1  ? ?Liver Function Tests: ?Recent Labs  ?Lab 03/31/22 ?4332  ?AST 23  ?ALT 27  ?ALKPHOS 50  ?BILITOT 0.9  ?PROT 6.9  ?ALBUMIN 3.9  ? ?No results for input(s): LIPASE, AMYLASE in the last 168 hours. ?No results for input(s): AMMONIA in the last 168 hours. ?Coagulation Profile: ?No results for input(s): INR, PROTIME in the last 168 hours. ?CBC: ?Recent Labs  ?Lab 03/27/22 ?0118 03/30/22 ?1734 03/31/22 ?9518  ?WBC 5.9 4.8 5.2  ?HGB 10.5* 12.4* 12.7*  ?HCT 32.1* 37.0* 37.6*  ?MCV 92.8 90.9 91.3  ?PLT 116* 133* 135*  ? ?Cardiac Enzymes: ?No results for input(s): CKTOTAL, CKMB, CKMBINDEX, TROPONINI in the last 168 hours. ?BNP: ?Invalid input(s): POCBNP ?CBG: ?Recent Labs  ?Lab 04/01/22 ?1143 04/01/22 ?1617 04/01/22 ?2116 04/02/22 ?8416 04/02/22 ?1104  ?GLUCAP 96 116* 101* 91 146*  ? ?HbA1C: ?No results for input(s): HGBA1C in the last 72 hours. ?Urine analysis: ?   ?Component Value Date/Time  ? [place] YELLOW 03/25/2022 0655  ? APPEARANCEUR CLEAR 03/25/2022 0655  ? LABSPEC 1.023 03/25/2022 0655  ? PHURINE 5.0 03/25/2022 0655  ? [place] NEGATIVE 03/25/2022 0655  ? [place] NEGATIVE 03/25/2022 0655  ? [place] NEGATIVE 03/25/2022 0655  ? [place] NEGATIVE 03/25/2022 0655  ? [place] NEGATIVE 03/25/2022 0655  ?  UROBILINOGEN 1.0 06/23/2010 0752  ? NITRITE NEGATIVE 03/25/2022 0655  ? LEUKOCYTESUR NEGATIVE 03/25/2022 0655  ? ?Sepsis Labs: ?'@LABRCNTIP'$ (procalcitonin:4,lacticidven:4) ?) ?Recent Results (from the past 240 hour(s))  ?Surgical PCR Screen     Status: None  ? Collection Time: 03/25/22  6:35 AM  ? Specimen: Nasal Mucosa; Nasal Swab  ?Result Value Ref Range Status  ? MRSA, PCR NEGATIVE NEGATIVE Final  ? Staphylococcus aureus NEGATIVE NEGATIVE Final  ?  Comment: (NOTE) ?The Xpert SA Assay (FDA approved for NASAL specimens in patients 33 ?years of age and older), is one component of a comprehensive ?surveillance program. It is not intended to diagnose infection nor to ?guide or monitor treatment. ?Performed at [facility], [place] [address], [place], [place] ?[address] ?  ?Resp Panel by RT-PCR (Flu A&B, Covid) Nasopharyngeal Swab     Status: None  ?  Collection Time: 03/30/22  7:49 PM  ? Specimen: Nasopharyngeal Swab; Nasopharyngeal(NP) swabs in vial transport medium  ?Result Value Ref Range Status  ? SARS Coronavirus 2 by RT PCR NEGATIVE NEGATIVE Final  ?  Comment: (NOTE) ?SARS-CoV-2 target nucleic acids are NOT DETECTED. ? ?The SARS-CoV-2 RNA is generally detectable in upper respiratory ?specimens during the acute phase of infection. The lowest ?concentration of SARS-CoV-2 viral copies this assay can detect is ?138 copies/mL. A negative result does not preclude SARS-Cov-2 ?infection and should not be used as the sole basis for treatment or ?other patient management decisions. A negative result may occur with  ?improper specimen collection/handling, submission of specimen other ?than nasopharyngeal swab, presence of viral mutation(s) within the ?areas targeted by this assay, and inadequate number of viral ?copies(<138 copies/mL). A negative result must be combined with ?clinical observations, patient history, and epidemiological ?information. The expected result is Negative. ? ?Fact Sheet for Patients:   ?EntrepreneurPulse.com.au ? ?Fact Sheet for Healthcare Providers:  ?IncredibleEmployment.be ? ?This test is no t yet approved or cleared by the [place] and  ?has been authorized

## 2022-04-02 NOTE — Care Management Important Message (Signed)
Important Message ? ?Patient Details  ?Name: Christopher Randolph ?MRN: [id number] ?Date of Birth: November 07, 1946 ? ? ?Medicare Important Message Given:  N/A - LOS <3 / Initial given by admissions ? ? ? ? ?Tommy Medal ?04/02/2022, 11:21 AM ?

## 2022-04-02 NOTE — Progress Notes (Signed)
Central Tele called and stated patient had 10 beat run of Vtach. Secure chat sent to Dr. Josephine Cables, instructed to continue to monitor.  ?

## 2022-04-02 NOTE — Progress Notes (Signed)
SATURATION QUALIFICATIONS: (This note is used to comply with regulatory documentation for home oxygen) ?  ?Patient Saturations on Room Air at Rest = 86 ?  ?Patient Saturations on Room Air while Ambulating = N/A ?  ?Patient Saturations on 2 Liters of oxygen while Ambulating = 95 ?  ?Please briefly explain why patient needs home oxygen: To maintain 02 sat at 90% or above during ambulation. ? ?Orson Eva, DO ?

## 2022-04-02 NOTE — Progress Notes (Signed)
? ?Progress Note ? ?Patient Name: Christopher Randolph ?Date of Encounter: 04/02/2022 ? ?[facility] Cardiologist: Rozann Lesches, MD  ? ?Subjective  ? ?SOB improving. LE edema resolved.  ? ?Inpatient Medications  ?  ?Scheduled Meds: ? aspirin EC  81 mg Oral Daily  ? atorvastatin  20 mg Oral Daily  ? clopidogrel  75 mg Oral Daily  ? cycloSPORINE  1 drop Both Eyes Daily  ? furosemide  40 mg Intravenous Q12H  ? heparin  5,000 Units Subcutaneous Q8H  ? insulin aspart  0-15 Units Subcutaneous TID WC  ? insulin aspart  0-5 Units Subcutaneous QHS  ? latanoprost  1 drop Left Eye QHS  ? [START ON 04/03/2022] levothyroxine  150 mcg Oral Q72H  ? [START ON 04/05/2022] levothyroxine  150 mcg Oral Q72H  ? [START ON 04/04/2022] levothyroxine  175 mcg Oral Q72H  ? lisinopril  2.5 mg Oral Daily  ? prednisoLONE acetate  1 drop Left Eye Daily  ? tamsulosin  0.8 mg Oral QHS  ? ?Continuous Infusions: ? ?PRN Meds: ?acetaminophen **OR** acetaminophen, morphine injection, ondansetron **OR** ondansetron (ZOFRAN) IV, oxyCODONE-acetaminophen  ? ?Vital Signs  ?  ?Vitals:  ? 04/01/22 1349 04/01/22 2014 04/02/22 0522 04/02/22 0600  ?BP: 118/61 (!) 123/52 (!) 111/51   ?Pulse: 62 (!) 55 (!) 57   ?Resp:  18 16   ?Temp: 97.8 ?F (36.6 ?C) 97.8 ?F (36.6 ?C) 97.9 ?F (36.6 ?C)   ?TempSrc: Oral Oral Oral   ?SpO2: 92% 95% 94%   ?Weight:    78.5 kg  ?Height:    '5\' 8"'$  (1.727 m)  ? ? ?Intake/Output Summary (Last 24 hours) at 04/02/2022 1208 ?Last data filed at 04/02/2022 9937 ?Gross per 24 hour  ?Intake 240 ml  ?Output 2500 ml  ?Net -2260 ml  ? ? ?  04/02/2022  ?  6:00 AM 04/01/2022  ?  6:00 AM 03/30/2022  ?  5:05 PM  ?Last 3 Weights  ?Weight (lbs) 173 lb 1 oz 175 lb 0.7 oz 190 lb  ?Weight (kg) 78.5 kg 79.4 kg 86.183 kg  ?   ? ?Telemetry  ?  ?SR/SB - Personally Reviewed ? ?ECG  ?  ?No new ECG - Personally Reviewed ? ?Physical Exam  ? ?GEN: No acute distress.   ?Neck: No significant JVD ?Cardiac: RRR, no murmurs, rubs, or gallops.  ?Respiratory: Faint bibasilar crackles ?GI:  Soft, nontender, non-distended  ?MS: No edema; No deformity. ?Neuro:  Nonfocal  ?Psych: Normal affect  ? ?Labs  ?  ?High Sensitivity Troponin:   ?Recent Labs  ?Lab 03/30/22 ?1734 03/30/22 ?1915 03/31/22 ?0530  ?TROPONINIHS '6 10 6  '$ ?   ?Chemistry ?Recent Labs  ?Lab 03/31/22 ?1696 04/01/22 ?7893 04/02/22 ?8101  ?NA 139 140 139  ?K 3.6 3.5 3.6  ?CL 101 97* 96*  ?CO2 31 34* 34*  ?GLUCOSE 118* 101* 96  ?BUN '12 17 20  '$ ?CREATININE 0.89 0.93 1.11  ?CALCIUM 8.8* 8.7* 8.8*  ?MG 1.6* 2.4 2.1  ?PROT 6.9  --   --   ?ALBUMIN 3.9  --   --   ?AST 23  --   --   ?ALT 27  --   --   ?ALKPHOS 50  --   --   ?BILITOT 0.9  --   --   ?GFRNONAA >60 >60 >60  ?ANIONGAP '7 9 9  '$ ?  ?Lipids No results for input(s): CHOL, TRIG, HDL, LABVLDL, LDLCALC, CHOLHDL in the last 168 hours.  ?Hematology ?Recent Labs  ?  Lab 03/27/22 ?0118 03/30/22 ?1734 03/31/22 ?3762  ?WBC 5.9 4.8 5.2  ?RBC 3.46* 4.07* 4.12*  ?HGB 10.5* 12.4* 12.7*  ?HCT 32.1* 37.0* 37.6*  ?MCV 92.8 90.9 91.3  ?MCH 30.3 30.5 30.8  ?MCHC 32.7 33.5 33.8  ?RDW 13.3 13.5 13.4  ?PLT 116* 133* 135*  ? ?Thyroid  ?Recent Labs  ?Lab 03/31/22 ?0530  ?TSH 6.760*  ?  ?BNP ?Recent Labs  ?Lab 03/30/22 ?1734  ?BNP 172.0*  ?  ?DDimer No results for input(s): DDIMER in the last 168 hours.  ? ?Radiology  ?  ?No results found. ? ?Cardiac Studies  ? ?TTE 02/28/22: ?IMPRESSIONS  ? ? 1. Left ventricular ejection fraction, by estimation, is 60 to 65%. The  ?left ventricle has normal function. The left ventricle has no regional  ?wall motion abnormalities. Left ventricular diastolic parameters are  ?consistent with Grade II diastolic  ?dysfunction (pseudonormalization). Elevated left atrial pressure.  ? 2. Right ventricular systolic function is normal. The right ventricular  ?size is normal. There is normal pulmonary artery systolic pressure. The  ?estimated right ventricular systolic pressure is 83.1 mmHg.  ? 3. Left atrial size was mildly dilated.  ? 4. The mitral valve is normal in structure. No evidence of  mitral valve  ?regurgitation. No evidence of mitral stenosis.  ? 5. The aortic valve is tricuspid. There is mild calcification of the  ?aortic valve. Aortic valve regurgitation is not visualized. Aortic valve  ?sclerosis is present, with no evidence of aortic valve stenosis.  ? 6. The inferior vena cava is normal in size with greater than 50%  ?respiratory variability, suggesting right atrial pressure of 3 mmHg.  ? ?Patient Profile  ?   ?76 y.o. male with history of CAD, prior TIA and carotid artery stenosis s/p right transcarotid revascularization 03/25/2022 who presented with worsening SOB, orthopnea and 13lbs weight gain concerning for acute on chronic HF exacerbation. ? ?Assessment & Plan  ?  ?#Acute on Chronic HFpEF exacerbation: ?TTE 02/28/22 with LVEF 60-65%, G2DD, no significant valve disease. Presented with worsening volume overload and weight gain in the setting of IVF resucitation following recent transcarotid revascularization procedure. Diuresing well with IV lasix. Wt 190>173lbs. ?-Transition to lasix '40mg'$  PO daily tomorrow ?-Continue lisinopril 2.'5mg'$  daily ?-Monitor I/Os and daily weights ?-Low Na diet ? ?#CAD: ?Patient with known moderate RCA disease on medical management. ?-Continue ASA '81mg'$  daily, lipitor '20mg'$  daily ?-On plavix '75mg'$  given recent TIA ? ?#HTN: ?-Continue lisinopril 2.'5mg'$  daily ? ?#Carotid Artery Stenosis: ?#Recent TIA: ?S/p right transcarotid revascularization 03/25/2022. ?-Continue ASA '81mg'$  daily, lipitor '20mg'$  daily ?-Continue plavix '75mg'$  daily ? ?Cardiology will sign-off. Please feel free to call with questions or concerns.  ? ?For questions or updates, please contact [place] ?Please consult [email or web address] for contact info under  ? ?  ?   ?Signed, ?Freada Bergeron, MD  ?04/02/2022, 12:08 PM    ?

## 2022-04-03 LAB — BASIC METABOLIC PANEL
Anion gap: 8 (ref 5–15)
BUN: 22 mg/dL (ref 8–23)
CO2: 33 mmol/L — ABNORMAL HIGH (ref 22–32)
Calcium: 9 mg/dL (ref 8.9–10.3)
Chloride: 99 mmol/L (ref 98–111)
Creatinine, Ser: 1.03 mg/dL (ref 0.61–1.24)
GFR, Estimated: >60 mL/min (ref >60)
Glucose, Bld: 100 mg/dL — ABNORMAL HIGH (ref 70–99)
Potassium: 3.7 mmol/L (ref 3.5–5.1)
Sodium: 140 mmol/L (ref 135–145)

## 2022-04-03 LAB — GLUCOSE, CAPILLARY
Glucose-Capillary: 83 mg/dL (ref 70–99)
Glucose-Capillary: 102 mg/dL — ABNORMAL HIGH (ref 70–99)
Glucose-Capillary: 169 mg/dL — ABNORMAL HIGH (ref 70–99)

## 2022-04-03 MED ORDER — LISINOPRIL 2.5 MG PO TABS
2.5000 mg | ORAL_TABLET | Freq: Every day | ORAL | 1 refills | Status: DC
Start: 2022-04-04 — End: 2022-08-04

## 2022-04-03 MED ORDER — FUROSEMIDE 40 MG PO TABS: 40.0000 mg | ORAL_TABLET | Freq: Every day | ORAL | 1 refills | Status: DC

## 2022-04-03 NOTE — Discharge Summary (Signed)
?Physician Discharge Summary ?  ?Patient: Christopher Randolph MRN: [id number] DOB: 11-09-1946  ?Admit date:     03/30/2022  ?Discharge date: 04/03/22  ?Discharge Physician: Shanon Brow Lauraine Crespo  ? ?PCP: Curlene Labrum, MD  ? ?Recommendations at discharge:  ? Please follow up with primary care provider within 1-2 weeks ? Please repeat BMP and CBC in one week ? ? ? ? ?Hospital Course: ? 76 y.o. male with medical history significant of with history of TIA, coronary artery disease, carotid artery disease status post endarterectomy on March 29, COPD, hypertension, hyperlipidemia, hypothyroidism, peripheral artery disease, type 2 diabetes mellitus, and more presents to ED with a chief complaint of shortness of breath.  Patient reports that he had been on and off of oxygen during his last hospitalization, but ultimately was discharged on room air.  Since that time he has been progressively more short of breath.  Its exertional.  Is reported orthopnea over the past 2 days, trying to sleep in his recliner chair.  He has an associated left chest discomfort.  He describes it as a small stabbing discomfort that lasts for minutes.  It can happen when he is resting or while he is exerting himself.  He has no associated nausea or diaphoresis.  He does have a dry cough.  He denies any fever.  His diastolic CHF was first seen on echo when he was diagnosed with a TIA.  His symptoms at that time were loss of vision in his right eye.  Patient has since been on aspirin, Plavix, statin.  He reports that he is compliant with all of his medications.  He is also compliant with a low-sodium diet.  Patient reports he had 13 pounds of weight gain since his carotid endarterectomy.  Patient has no other complaints at this time.  The patient was started on IV furosemide 40 mg IV twice daily with clinical improvement.  He was -17 pounds from admission.  His discharge weight was 172 pounds.  He will go home with furosemide 40 mg  po daily ? ?Assessment and  Plan: ?* Acute respiratory failure with hypoxia (HCC) ?- No oxygen requirement at home ?-Requiring 2 L nasal cannula to maintain oxygen saturations ?-secondary to CHF in setting of underlying COPD (80 pack years) ?-PE ruled out with CTA ?-Troponin normal x2 6, 10 ?-Negative respiratory panel ?-d/c on 2L [place] as he desaturates to 86% on RA ? ?Acute on chronic diastolic CHF (congestive heart failure) ([place]) ?- Last echo was done when patient was diagnosed with a TIA at the beginning of March ?-02/28/22 Echo EF 60 to 65% with grade 2 diastolic dysfunction ?-13 pound weight gain, peripheral edema, shortness of breath at arrival ?-Continue 40 mg IV Lasix twice daily>>lasix 40 po daily on 4/7 ?-Chest x-ray showed linear densities in both lower lung fields suggesting subsegmental atelectasis ?-CTA shows no pulmonary embolus or acute aortic syndrome.  Small right pleural effusion ?-Patient complains of orthopnea, dyspnea on exertion, peripheral edema, he has a pleural effusion ?-appreciate cardiology ?-NEG 17 lbs for the admission ?DC weight 172 lbs ? ?Hypomagnesemia ?repleted ? ?DMII (diabetes mellitus, type 2) ([place]) ?Hold metformin>>restart after dc ?-02/28/22 hemoglobin A1c 6.1 ?-Sliding scale coverage ? ?Dyslipidemia ?- Continue statin ? ?Hypothyroidism ?- Continue Synthroid ?-Check TSH--6.760 ?-repeat TSH in 4 weeks ? ?Hypertension ?- Patient reports recently taken off of hypertensive medications ?-Started low-dose lisinopril for CHF and DM ? ? ? ? ?Consultants: cardiology ?Procedures performed: none ?Disposition: Home ?Diet recommendation:  ?Carb modified  diet ?DISCHARGE MEDICATION: ?Allergies as of 04/03/2022   ? ?   Reactions  ? Erythromycin Swelling  ? swelling of lips  ? Shellfish Allergy Rash, Other (See Comments)  ? Gout flares  ? Azithromycin Swelling  ? ?  ? ?  ?Medication List  ?  ? ?STOP taking these medications   ? ?oxyCODONE-acetaminophen 5-325 MG tablet ?Commonly known as: PERCOCET/ROXICET ?  ?Prolensa 0.07 %  Soln ?Generic drug: Bromfenac Sodium ?  ? ?  ? ?TAKE these medications   ? ?aspirin EC 81 MG tablet ?Take 81 mg by mouth daily. Swallow whole. ?  ?atorvastatin 20 MG tablet ?Commonly known as: LIPITOR ?TAKE 1 TABLET BY MOUTH DAILY ?  ?clopidogrel 75 MG tablet ?Commonly known as: Plavix ?Take 1 tablet (75 mg total) by mouth daily. ?  ?colchicine 0.6 MG tablet ?Take 0.6 mg by mouth daily as needed (Gout). ?  ?cyclobenzaprine 10 MG tablet ?Commonly known as: FLEXERIL ?Take 10 mg by mouth every 8 (eight) hours as needed for muscle spasms. ?  ?cycloSPORINE 0.05 % ophthalmic emulsion ?Commonly known as: RESTASIS ?Place 1 drop into both eyes daily. ?  ?fluticasone 50 MCG/ACT nasal spray ?Commonly known as: FLONASE ?Place 2 sprays into both nostrils daily. ?  ?furosemide 40 MG tablet ?Commonly known as: LASIX ?Take 1 tablet (40 mg total) by mouth daily. ?Start taking on: April 04, 2022 ?  ?latanoprost 0.005 % ophthalmic solution ?Commonly known as: XALATAN ?Place 1 drop into the left eye at bedtime. ?  ?levothyroxine 175 MCG tablet ?Commonly known as: SYNTHROID ?Take 175 mcg by mouth See admin instructions. Alternates taking 150 mg daily for 2 days then 175 mcg the third day then repeat ?  ?levothyroxine 150 MCG tablet ?Commonly known as: SYNTHROID ?Take 150 mcg by mouth See admin instructions. Alternates taking 150 mg daily for 2 days then 175 mcg the third day then repeat ?  ?lisinopril 2.5 MG tablet ?Commonly known as: ZESTRIL ?Take 1 tablet (2.5 mg total) by mouth daily. ?Start taking on: April 04, 2022 ?  ?metFORMIN 500 MG tablet ?Commonly known as: GLUCOPHAGE ?Take 500 mg by mouth daily. ?  ?mometasone 0.1 % ointment ?Commonly known as: ELOCON ?Apply 1 application. topically at bedtime. ?  ?naproxen sodium 220 MG tablet ?Commonly known as: ALEVE ?Take 220 mg by mouth daily as needed (for pain.). ?  ?nitroGLYCERIN 0.4 MG SL tablet ?Commonly known as: NITROSTAT ?DISSOLVE 1 TABLET UNDER THE TONGUE EVERY 5 MINUTES AS NEEDED  FOR CHEST PAIN. DO NOT EXCEED A TOTAL OF 3 DOSES IN 15 MINUTES. ?What changed: See the new instructions. ?  ?prednisoLONE acetate 1 % ophthalmic suspension ?Commonly known as: PRED FORTE ?Place 1 drop into the left eye daily. ?  ?tamsulosin 0.4 MG Caps capsule ?Commonly known as: FLOMAX ?Take 0.8 mg by mouth at bedtime. ?  ? ?  ? ?  ?  ? ? ?  ?Durable Medical Equipment  ?(From admission, onward)  ?  ? ? ?  ? ?  Start     Ordered  ? 04/02/22 1758  For home use only DME oxygen  Once       ?Question Answer Comment  ?Length of Need 6 Months   ?Mode or (Route) Nasal cannula   ?Liters per Minute 2   ?Frequency Continuous (stationary and portable oxygen unit needed)   ?Oxygen conserving device Yes   ?Oxygen delivery system Gas   ?  ? 04/02/22 1757  ? ?  ?  ? ?  ? ?  Follow-up Information   ? ? Imogene Burn, PA-C Follow up on 05/05/2022.   ?Specialty: Cardiology ?Why: Cardiology Hospital Follow-up on 05/05/2022 at 1:00 PM. ?Contact information: ?[address] [place] ?[place] [address] ?[phone] ? ? ?  ?  ? ?  ?  ? ?  ? ?Discharge Exam: ?Filed Weights  ? 04/01/22 0600 04/02/22 0600 04/03/22 0505  ?Weight: 79.4 kg 78.5 kg 78.3 kg  ?HEENT:  [place]/AT, No thrush, no icterus ?CV:  RRR, no rub, no S3, no S4 ?Lung:  bibasilar crackles.  No wheeze ?Abd:  soft/+BS, NT ?Ext:  No edema, no lymphangitis, no synovitis, no rash ? ? ? ?Condition at discharge: stable ? ?The results of significant diagnostics from this hospitalization (including imaging, microbiology, ancillary and laboratory) are listed below for reference.  ? ?Imaging Studies: ?DG Chest 2 View ? ?Result Date: 03/30/2022 ?CLINICAL DATA:  Chest pain, shortness of breath EXAM: CHEST - 2 VIEW COMPARISON:  02/27/2022 FINDINGS: Cardiac size is within normal limits. There are no signs of pulmonary edema. There are linear densities in the lower lung fields with interval worsening on the left side. There is minimal blunting of right lateral CP angle. There is no pneumothorax.  IMPRESSION: There are linear densities in both lower lung fields suggesting subsegmental atelectasis/pneumonia. Small right pleural effusion. Electronically Signed   By: Elmer Picker M.D.   On: 03/30/2022 18:01

## 2022-04-03 NOTE — TOC Transition Note (Signed)
Transition of Care (TOC) - CM/SW Discharge Note ? ? ?Patient Details  ?Name: Christopher Randolph ?MRN: [id number] ?Date of Birth: 1946/11/21 ? ?Transition of Care (TOC) CM/SW Contact:  ?Josian Lanese D, LCSW ?Phone Number: ?04/03/2022, 11:41 AM ? ? ?Clinical Narrative:    ?Patient discharging on 2L home oxygen. Discussed DME providers. Referral made and accepted by [employer].  ? ? ?  ?Barriers to Discharge: No Barriers Identified ? ? ?Patient Goals and CMS Choice ?Patient states their goals for this hospitalization and ongoing recovery are:: return home ?  ?  ? ?Discharge Placement ?  ?           ?  ?  ?  ?  ? ?Discharge Plan and Services ?  ?  ?           ?DME Arranged: Oxygen ?DME Agency: [facility] ?Date DME Agency Contacted: 04/03/22 ?Time DME Agency Contacted: [phone] ?Representative spoke with at DME Agency: oxygen ?  ?  ?  ?  ?  ? ?Social Determinants of Health (SDOH) Interventions ?  ? ? ?Readmission Risk Interventions ? ?  03/26/2022  ? 10:24 AM  ?Readmission Risk Prevention Plan  ?Post Dischage Appt Complete  ?Medication Screening Complete  ?Transportation Screening Complete  ? ? ? ? ? ?

## 2022-04-09 ENCOUNTER — Encounter (INDEPENDENT_AMBULATORY_CARE_PROVIDER_SITE_OTHER): Payer: Self-pay | Admitting: Ophthalmology

## 2022-04-09 ENCOUNTER — Ambulatory Visit (INDEPENDENT_AMBULATORY_CARE_PROVIDER_SITE_OTHER): Payer: Medicare Other | Admitting: Ophthalmology

## 2022-04-09 DIAGNOSIS — T85398A Other mechanical complication of other ocular prosthetic devices, implants and grafts, initial encounter: Secondary | ICD-10-CM

## 2022-04-09 DIAGNOSIS — H35352 Cystoid macular degeneration, left eye: Secondary | ICD-10-CM

## 2022-04-09 DIAGNOSIS — H02831 Dermatochalasis of right upper eyelid: Secondary | ICD-10-CM

## 2022-04-09 DIAGNOSIS — H02834 Dermatochalasis of left upper eyelid: Secondary | ICD-10-CM | POA: Diagnosis not present

## 2022-04-09 DIAGNOSIS — Z961 Presence of intraocular lens: Secondary | ICD-10-CM

## 2022-04-09 DIAGNOSIS — H401321 Pigmentary glaucoma, left eye, mild stage: Secondary | ICD-10-CM

## 2022-04-09 DIAGNOSIS — A6920 Lyme disease, unspecified: Secondary | ICD-10-CM

## 2022-04-09 DIAGNOSIS — H209 Unspecified iridocyclitis: Secondary | ICD-10-CM

## 2022-04-09 DIAGNOSIS — H4042X0 Glaucoma secondary to eye inflammation, left eye, stage unspecified: Secondary | ICD-10-CM | POA: Diagnosis not present

## 2022-04-09 DIAGNOSIS — H27122 Anterior dislocation of lens, left eye: Secondary | ICD-10-CM

## 2022-04-09 DIAGNOSIS — H57819 Brow ptosis, unspecified: Secondary | ICD-10-CM

## 2022-04-09 NOTE — Progress Notes (Signed)
?[facility] Note ? ?04/09/2022 ? ?  ? ?CHIEF COMPLAINT ?Patient presents for Retina Follow Up ? ?HISTORY OF PRESENT ILLNESS: ?Christopher Randolph is a 76 y.o. male who presents to the clinic today for:  ? ?HPI   ? ? Retina Follow Up   ?Patient presents with  Other.  In left eye.  This started 3 months ago.  I, the attending physician,  performed the HPI with the patient and updated documentation appropriately. ? ?  ?  ? ? Comments   ?Patient here for 3 months retina follow up for uveitis-hyphema-glaucoma syndrome of OS. Patient states vision is ok. No eye pain. Has new glasses. Had stint put in carotid. Last week DX with dyastolic heart failure. Put on O2 ? ?  ?  ?Last edited by [name], MD on 04/09/2022  8:57 PM.  ?  ? ?Pt states he had a TIA on March 2, he came in and saw Dr. 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 [name] and says he sees the best he has seen in years ? ?Referring physician: ?Curlene Labrum, MD ?[address] ?[place],  [place] [address] ? ?HISTORICAL INFORMATION:  ? ?Selected notes from the [facility] ?Referred by Dr. [name] for concern of vitreous hemorrhage  ? ?CURRENT MEDICATIONS: ?Current Outpatient Medications (Ophthalmic Drugs)  ?Medication Sig  ? cycloSPORINE (RESTASIS) 0.05 % ophthalmic emulsion Place 1 drop into both eyes daily.  ? latanoprost (XALATAN) 0.005 % ophthalmic solution Place 1 drop into the left eye at bedtime.  ? prednisoLONE acetate (PRED FORTE) 1 % ophthalmic suspension Place 1 drop into the left eye daily.  ? ?No current facility-administered medications for this visit. (Ophthalmic Drugs)  ? ?Current Outpatient Medications (Other)  ?Medication Sig  ?  aspirin EC 81 MG tablet Take 81 mg by mouth daily. Swallow whole.  ? atorvastatin (LIPITOR) 20 MG tablet TAKE 1 TABLET BY MOUTH DAILY (Patient taking differently: Take 20 mg by mouth daily.)  ? clopidogrel (PLAVIX) 75 MG tablet Take 1 tablet (75 mg total) by mouth daily.  ? colchicine 0.6 MG tablet Take 0.6 mg by mouth daily as needed (Gout).  ? cyclobenzaprine (FLEXERIL) 10 MG tablet Take 10 mg by mouth every 8 (eight) hours as needed for muscle spasms.  ? fluticasone (FLONASE) 50 MCG/ACT nasal spray Place 2 sprays into both nostrils daily.  ? furosemide (LASIX) 40 MG tablet Take 1 tablet (40 mg total) by mouth daily.  ? levothyroxine (SYNTHROID, LEVOTHROID) 150 MCG tablet Take 150 mcg by mouth See admin instructions. Alternates taking 150 mg daily for 2 days then 175 mcg the third day then repeat  ? levothyroxine (SYNTHROID, LEVOTHROID) 175 MCG tablet Take 175 mcg by mouth See admin instructions. Alternates taking 150 mg daily for 2 days then 175 mcg the third day then repeat  ? lisinopril (ZESTRIL) 2.5 MG tablet Take 1 tablet (2.5 mg total) by mouth daily.  ? metFORMIN (GLUCOPHAGE) 500 MG tablet Take 500 mg by mouth daily.  ? mometasone (ELOCON) 0.1 % ointment Apply 1 application. topically at bedtime.  ? naproxen sodium (ANAPROX) 220 MG  tablet Take 220 mg by mouth daily as needed (for pain.).   ? nitroGLYCERIN (NITROSTAT) 0.4 MG SL tablet DISSOLVE 1 TABLET UNDER THE TONGUE EVERY 5 MINUTES AS NEEDED FOR CHEST PAIN. DO NOT EXCEED A TOTAL OF 3 DOSES IN 15 MINUTES. (Patient taking differently: Place 0.4 mg under the tongue every 5 (five) minutes as needed for chest pain.)  ? tamsulosin (FLOMAX) 0.4 MG CAPS capsule Take 0.8 mg by mouth at bedtime.  ? ?No current facility-administered medications for this visit. (Other)  ? ?REVIEW OF SYSTEMS: ?ROS   ?Positive for: Musculoskeletal, Cardiovascular, Eyes ?Negative for: Constitutional, Gastrointestinal, Neurological, Skin, Genitourinary, HENT, Endocrine, Respiratory,  Psychiatric, Allergic/Imm, Heme/Lymph ?Last edited by [name], COA on 04/09/2022  8:57 AM.  ?  ? ?ALLERGIES ?Allergies  ?Allergen Reactions  ? Erythromycin Swelling  ?  swelling of lips  ? Shellfish Allergy Rash and Other (See Comments)  ?  Gout flares  ? Azithromycin Swelling  ? ?PAST MEDICAL HISTORY ?Past Medical History:  ?Diagnosis Date  ? Arthritis   ? Brain TIA 02/26/2022  ? CAD (coronary artery disease)   ? Branch vessel and moderate RCA disease 2006  ? Cancer [facility]) 1990  ? Melanoma Lower right Leg  ? Carotid artery disease ([place])   ? Complication of anesthesia 2020  ? blood presure dropped when  started to put payient , blood preesure.  ? COPD (chronic obstructive pulmonary disease) ([place])   ? Essential hypertension   ? Glaucoma   ? Hyperlipidemia   ? Hypothyroidism   ? Lyme disease   ? PAD (peripheral artery disease) ([place])   ? Left common iliac stent 2004  ? Pneumonia   ? TIA (transient ischemic attack) 02/2022  ? Type 2 diabetes mellitus ([place])   ? Wears dentures   ? Wears glasses   ? ?Past Surgical History:  ?Procedure Laterality Date  ? CATARACT EXTRACTION Bilateral   ? CATARACT EXTRACTION, BILATERAL    ? CERVICAL DISC SURGERY    ? x 1  ? COLONOSCOPY N/A 11/06/2016  ? Procedure: COLONOSCOPY;  Surgeon: Daneil Dolin, MD;  [facility];  Service: Endoscopy;  Laterality: N/A;  7:30 AM  ? COLONOSCOPY  2012  ? Dr. Anthony Sar: normal. reviewed reports, which states he has a history of polyps in remote past.   ? ELBOW SURGERY Left   ? EYE SURGERY Bilateral   ? Cat Sx  ? EYE SURGERY Left 01/14/2021  ? [facility] - Dr. Jovita Kussmaul  ? IRIDOTOMY / IRIDECTOMY Left 01/14/2021  ? [place] - Dr. Jovita Kussmaul  ? JOINT REPLACEMENT    ? LUMBAR WOUND DEBRIDEMENT N/A 01/24/2019  ? Procedure: LUMBAR WOUND Exploration for Evacation of Seroma vs. Hematoma;  Surgeon: Jovita Gamma, MD;  Location: [place];  Service: Neurosurgery;  Laterality: N/A;  ? MELANOMA EXCISION    ? right leg, seen at [facility] and underwent immunotherapy   ? NECK SURGERY    ?  x 1 yrs ago  ? PARS PLANA VITRECTOMY Left 04/04/2020  ? Procedure: PARS PLANA VITRECTOMY WITH 25G REMOVAL/SUTURE INTRAOCULAR LENS;  Surgeon: [name], MD;  Location: [place];  Service: Ophthalmology;  Laterality: Left;  ? REPLACEMENT TOTAL KNEE Right   ? TRANSCAROTID ARTERY REVASCULARIZATION?  Right 03/25/2022  ? Procedure: Right Transcarotid Artery Revascularization;  Surgeon: Serafina Mitchell, MD;  [facility];  Service: Vascular;  Laterality: Right;  ? WISDOM TOOTH EXTRACTION    ? ? ?FAMILY HISTORY ?Family History  ?Problem Relation  Age of Onset  ? Heart disease Mother   ? Heart attack Mother   ? Colon polyps Mother   ? Colon polyps Brother   ? Heart attack Maternal Grandmother   ? Heart attack Maternal Grandfather   ? Colon cancer Neg Hx   ? ?SOCIAL HISTORY ?Social History  ? ?Tobacco Use  ? Smoking status: Former  ?  Packs/day: 1.50  ?  Years: 40.00  ?  Pack years: 60.00  ?  Types: Cigarettes  ?  Start date: 11/04/1963  ?  Quit date: 10/29/2003  ?  Years since quitting: 18.4  ? Smokeless tobacco: Never  ?Vaping Use  ? Vaping Use: Never used  ?Substance Use Topics  ? Alcohol use: Yes  ?  Alcohol/week: 15.0 standard drinks  ?  Types: 14 Cans of beer, 1 Standard drinks or equivalent per week  ?  Comment: 2 beers or a mixed drink  ? Drug use: No  ?  ? ?  ?OPHTHALMIC EXAM: ? ?Base Eye Exam   ? ? Visual Acuity (Snellen - Linear)   ? ?   Right Left  ? Dist cc 20/15 -1 20/25 +2  ? Dist ph cc  NI  ? ? Correction: Glasses  ? ?  ?  ? ? Tonometry (Tonopen, 8:52 AM)   ? ?   Right Left  ? Pressure 14 17  ? ?  ?  ? ? Pupils   ? ?   Dark Light Shape React APD  ? Right 3 2 Round Brisk None  ? Left 3 2 Round Brisk None  ? ?  ?  ? ? Visual Fields (Counting fingers)   ? ?   Left Right  ?  Full Full  ? ?  ?  ? ? Extraocular Movement   ? ?   Right Left  ?  Full, Ortho Full, Ortho  ? ?  ?  ? ? Neuro/Psych   ? ? Oriented x3: Yes  ? Mood/Affect: Normal  ? ?  ?  ? ? Dilation   ? ? Both eyes: 1.0% Mydriacyl,  2.5% Phenylephrine @ 8:52 AM  ? ?  ?  ? ?  ? ?Slit Lamp and Fundus Exam   ? ? External Exam   ? ?   Right Left  ? External brow ptosis brow ptosis; mild periorbital edema and erythema  ? ?  ?  ? ? Slit Lamp

## 2022-04-13 ENCOUNTER — Encounter (INDEPENDENT_AMBULATORY_CARE_PROVIDER_SITE_OTHER): Payer: Medicare Other | Admitting: Ophthalmology

## 2022-04-20 ENCOUNTER — Other Ambulatory Visit: Payer: Self-pay | Admitting: *Deleted

## 2022-04-20 DIAGNOSIS — I6521 Occlusion and stenosis of right carotid artery: Secondary | ICD-10-CM

## 2022-04-28 NOTE — Progress Notes (Signed)
? ?Cardiology Office Note   ? ?Date:  05/05/2022  ? ?ID:  [name], DOB 06-Sep-1946, MRN [id number] ? ? ?PCP:  Christopher Labrum, MD ?  ?[place]  ?Cardiologist:  Christopher Lesches, MD   ?Advanced Practice Provider:  No care team member to display ?Electrophysiologist:  None  ? ?[id number]}  ? ?No chief complaint on file. ? ? ?History of Present Illness:  ?Christopher Randolph is a 76 y.o. male with history of CAD moderate RCA disease in 2009, normal Myoview 04/2021.  Was seen in the hospital 02/2022 for chest pain no evidence of ACS.  Also had a suspected TIA with amaurosis fugax on the right and found to have 70% ICA stenosis status post right transcarotid revascularization 03/25/2022 on Plavix and Lipitor.  Also has hypertension, HLD and DM2.  2D echo that admission LVEF 60 to 65% with grade 2 DD.  ? ?Patient admitted 03/31/22 with acute on chronic diastolic CHF after fluid resuscitation after carotid surgery and stopping hypertensive meds.  ? ? ?Patient comes in for f/u. He feels better. Was sent home on O2. Still weak and not much stamina. BP's running 136-150/70-80's, weight down 19 lbs. If he walks around the house without O2 on his heart rate jumps up. Amlodipine was stopped. Hr typically runs 50-60 and drops to 30's at night. BP gets too low at times low 100's and he has to hold his meds. Is watching his salt and limiting water intake. Has been having headaches. ? ? ?Past Medical History:  ?Diagnosis Date  ? Arthritis   ? Brain TIA 02/26/2022  ? CAD (coronary artery disease)   ? Branch vessel and moderate RCA disease 2006  ? Cancer [facility]) 1990  ? Melanoma Lower right Leg  ? Carotid artery disease ([place])   ? Complication of anesthesia 2020  ? blood presure dropped when  started to put payient , blood preesure.  ? COPD (chronic obstructive pulmonary disease) ([place])   ? Essential hypertension   ? Glaucoma   ? Hyperlipidemia   ? Hypothyroidism   ? Lyme disease   ? PAD (peripheral artery disease)  ([place])   ? Left common iliac stent 2004  ? Pneumonia   ? TIA (transient ischemic attack) 02/2022  ? Type 2 diabetes mellitus ([place])   ? Wears dentures   ? Wears glasses   ? ? ?Past Surgical History:  ?Procedure Laterality Date  ? CATARACT EXTRACTION Bilateral   ? CATARACT EXTRACTION, BILATERAL    ? CERVICAL DISC SURGERY    ? x 1  ? COLONOSCOPY N/A 11/06/2016  ? Procedure: COLONOSCOPY;  Surgeon: Daneil Dolin, MD;  [facility];  Service: Endoscopy;  Laterality: N/A;  7:30 AM  ? COLONOSCOPY  2012  ? Dr. Anthony Sar: normal. reviewed reports, which states he has a history of polyps in remote past.   ? ELBOW SURGERY Left   ? EYE SURGERY Bilateral   ? Cat Sx  ? EYE SURGERY Left 01/14/2021  ? [place] - Dr. Jovita Kussmaul  ? IRIDOTOMY / IRIDECTOMY Left 01/14/2021  ? [facility] - Dr. Jovita Kussmaul  ? JOINT REPLACEMENT    ? LUMBAR WOUND DEBRIDEMENT N/A 01/24/2019  ? Procedure: LUMBAR WOUND Exploration for Evacation of Seroma vs. Hematoma;  Surgeon: Jovita Gamma, MD;  Location: [place];  Service: Neurosurgery;  Laterality: N/A;  ? MELANOMA EXCISION    ? right leg, [facility] at [facility] and underwent immunotherapy  ? NECK SURGERY    ?  x 1 yrs ago  ? PARS PLANA VITRECTOMY Left 04/04/2020  ? Procedure: PARS PLANA VITRECTOMY WITH 25G REMOVAL/SUTURE INTRAOCULAR LENS;  Surgeon: [name], MD;  Location: [place];  Service: Ophthalmology;  Laterality: Left;  ? REPLACEMENT TOTAL KNEE Right   ? TRANSCAROTID ARTERY REVASCULARIZATION?  Right 03/25/2022  ? Procedure: Right Transcarotid Artery Revascularization;  Surgeon: Serafina Mitchell, MD;  Location: [facility] OR;  Service: Vascular;  Laterality: Right;  ? WISDOM TOOTH EXTRACTION    ? ? ?Current Medications: ?Current Meds  ?Medication Sig  ? aspirin EC 81 MG tablet Take 81 mg by mouth daily. Swallow whole.  ? atorvastatin (LIPITOR) 20 MG tablet TAKE 1 TABLET BY MOUTH DAILY (Patient taking differently: Take 20 mg by mouth daily.)  ? clopidogrel (PLAVIX) 75 MG tablet Take 1 tablet (75 mg total) by mouth  daily.  ? colchicine 0.6 MG tablet Take 0.6 mg by mouth daily as needed (Gout).  ? cyclobenzaprine (FLEXERIL) 10 MG tablet Take 10 mg by mouth every 8 (eight) hours as needed for muscle spasms.  ? fluticasone (FLONASE) 50 MCG/ACT nasal spray Place 2 sprays into both nostrils daily.  ? furosemide (LASIX) 40 MG tablet Take 1 tablet (40 mg total) by mouth daily.  ? latanoprost (XALATAN) 0.005 % ophthalmic solution Place 1 drop into the left eye at bedtime.  ? levothyroxine (SYNTHROID, LEVOTHROID) 150 MCG tablet Take 150 mcg by mouth See admin instructions. Alternates taking 150 mg daily for 2 days then 175 mcg the third day then repeat  ? levothyroxine (SYNTHROID, LEVOTHROID) 175 MCG tablet Take 175 mcg by mouth See admin instructions. Alternates taking 150 mg daily for 2 days then 175 mcg the third day then repeat  ? lisinopril (ZESTRIL) 2.5 MG tablet Take 1 tablet (2.5 mg total) by mouth daily.  ? metFORMIN (GLUCOPHAGE) 500 MG tablet Take 500 mg by mouth daily.  ? mometasone (ELOCON) 0.1 % ointment Apply 1 application. topically at bedtime.  ? nitroGLYCERIN (NITROSTAT) 0.4 MG SL tablet DISSOLVE 1 TABLET UNDER THE TONGUE EVERY 5 MINUTES AS NEEDED FOR CHEST PAIN. DO NOT EXCEED A TOTAL OF 3 DOSES IN 15 MINUTES. (Patient taking differently: Place 0.4 mg under the tongue every 5 (five) minutes as needed for chest pain.)  ? prednisoLONE acetate (PRED FORTE) 1 % ophthalmic suspension Place 1 drop into the left eye daily.  ? tamsulosin (FLOMAX) 0.4 MG CAPS capsule Take 0.8 mg by mouth at bedtime.  ?  ? ?Allergies:   Erythromycin, Shellfish allergy, and Azithromycin  ? ?Social History  ? ?Socioeconomic History  ? Marital status: Married  ?  Spouse name: Christopher Randolph  ? Number of children: Not on file  ? Years of education: Not on file  ? Highest education level: Not on file  ?Occupational History  ? Not on file  ?Tobacco Use  ? Smoking status: Former  ?  Packs/day: 1.50  ?  Years: 40.00  ?  Pack years: 60.00  ?  Types: Cigarettes  ?   Start date: 11/04/1963  ?  Quit date: 10/29/2003  ?  Years since quitting: 18.5  ?  Passive exposure: Never  ? Smokeless tobacco: Never  ?Vaping Use  ? Vaping Use: Never used  ?Substance and Sexual Activity  ? Alcohol use: Yes  ?  Alcohol/week: 15.0 standard drinks  ?  Types: 14 Cans of beer, 1 Standard drinks or equivalent per week  ?  Comment: 2 beers or a mixed drink  ? Drug use: No  ?  Sexual activity: Not on file  ?Other Topics Concern  ? Not on file  ?Social History Narrative  ? Lives with wife  ? ?Social Determinants of Health  ? ?Financial Resource Strain: Not on file  ?Food Insecurity: Not on file  ?Transportation Needs: Not on file  ?Physical Activity: Not on file  ?Stress: Not on file  ?Social Connections: Not on file  ?  ? ?Family History:  The patient's  family history includes Colon polyps in his brother and mother; Heart attack in his maternal grandfather, maternal grandmother, and mother; Heart disease in his mother.  ? ?ROS:   ?Please see the history of present illness.    ?ROS All other systems reviewed and are negative. ? ? ?PHYSICAL EXAM:   ?VS:  BP 112/62   Pulse 89   Ht '5\' 8"'$  (1.727 m)   Wt 167 lb (75.8 kg) Comment: Pt states  SpO2 95% Comment: 2 LPM  BMI 25.39 kg/m?   ?Physical Exam  ?GEN: Well nourished, well developed, in no acute distress  ?Neck: no JVD, carotid bruits, or masses ?Cardiac:RRR; no murmurs, rubs, or gallops  ?Respiratory:  clear to auscultation bilaterally, normal work of breathing ?GI: soft, nontender, nondistended, + BS ?Ext: without cyanosis, clubbing, or edema, Good distal pulses bilaterally ?Neuro:  Alert and Oriented x 3,  ?Psych: euthymic mood, full affect ? ?Wt Readings from Last 3 Encounters:  ?05/05/22 167 lb (75.8 kg)  ?05/04/22 167 lb (75.8 kg)  ?04/03/22 172 lb 11.2 oz (78.3 kg)  ?  ? ? ?Studies/Labs Reviewed:  ? ?EKG:  EKG is not ordered today.  T  ? ?Recent Labs: ?03/30/2022: B Natriuretic Peptide 172.0 ?03/31/2022: ALT 27; Hemoglobin 12.7; Platelets 135; TSH  6.760 ?04/02/2022: Magnesium 2.1 ?04/03/2022: BUN 22; Creatinine, Ser 1.03; Potassium 3.7; Sodium 140  ? ?Lipid Panel ?   ?Component Value Date/Time  ? CHOL 88 03/26/2022 0127  ? TRIG 61 03/26/2022 0127  ? HDL 3

## 2022-05-01 NOTE — Progress Notes (Signed)
?Office Note  ? ? ? ?CC:  follow up ?Requesting Provider:  Curlene Labrum, MD ? ?HPI: Christopher Randolph is a 76 y.o. ([birth year]) male who presents s/p right TCAR on 03/25/22 by Dr. Trula Slade.  This was performed secondary to right eye amaurosis fugax. Post operatively he had some issues with hypotension so he was kept in the hospital an extra day. He otherwise remained neurologically intact and was discharged home post op day 2. He says after discharge on 3/31 he had to return to ED on 4/3 secondary to shortness of breath and swelling in his legs. He was found to be in Acute CHF exacerbation. He was hospitalized for 4 days. He says since his discharge he has had a lot going on. He is now on home oxygen. He fatigues very easily. He has had continued blood pressure issues. And gets occasionally " swimmy headed". He also reports frequent headaches. He says he has some that are more uncomfortable in the back of his head and then some along his forehead and top of head that are more sort of sharp pins and needles like that only last 10-15 minutes and go away. He says these are more annoying. He has never had headaches in the past prior to this surgery and recent hospitalization. He has had no recurrent vision problems. He has no slurred speech or facial drooping. He does feel a little hoarse. No swallowing difficulties, no dizziness, no upper or lower extremity weakness or numbness. He does have long history of neuropathy, which he feels is getting worse.He describes this as burning and tingling in his feet that is pretty constant. He does not have any claudication symptoms, rest pain or tissue loss.  ? ?The pt is on a statin for cholesterol management.  ?The pt is on a daily aspirin.   Other AC:  Plavix ?The pt is on ACE for hypertension.   ?The pt is not diabetic.   ?Tobacco hx:  Former, 2004 ? ?Past Medical History:  ?Diagnosis Date  ? Arthritis   ? Brain TIA 02/26/2022  ? CAD (coronary artery disease)   ? Branch  vessel and moderate RCA disease 2006  ? Cancer [facility]) 1990  ? Melanoma Lower right Leg  ? Carotid artery disease ([place])   ? Complication of anesthesia 2020  ? blood presure dropped when  started to put payient , blood preesure.  ? COPD (chronic obstructive pulmonary disease) ([place])   ? Essential hypertension   ? Glaucoma   ? Hyperlipidemia   ? Hypothyroidism   ? Lyme disease   ? PAD (peripheral artery disease) ([place])   ? Left common iliac stent 2004  ? Pneumonia   ? TIA (transient ischemic attack) 02/2022  ? Type 2 diabetes mellitus ([place])   ? Wears dentures   ? Wears glasses   ? ? ?Past Surgical History:  ?Procedure Laterality Date  ? CATARACT EXTRACTION Bilateral   ? CATARACT EXTRACTION, BILATERAL    ? CERVICAL DISC SURGERY    ? x 1  ? COLONOSCOPY N/A 11/06/2016  ? Procedure: COLONOSCOPY;  Surgeon: Daneil Dolin, MD;  [facility];  Service: Endoscopy;  Laterality: N/A;  7:30 AM  ? COLONOSCOPY  2012  ? Dr. Anthony Sar: normal. reviewed reports, which states he has a history of polyps in remote past.   ? ELBOW SURGERY Left   ? EYE SURGERY Bilateral   ? Cat Sx  ? EYE SURGERY Left 01/14/2021  ? [facility] - Dr.  Jovita Kussmaul  ? IRIDOTOMY / IRIDECTOMY Left 01/14/2021  ? Sh[facility] - Dr. Jovita Kussmaul  ? JOINT REPLACEMENT    ? LUMBAR WOUND DEBRIDEMENT N/A 01/24/2019  ? Procedure: LUMBAR WOUND Exploration for Evacation of Seroma vs. Hematoma;  Surgeon: Jovita Gamma, MD;  Location: [place];  Service: Neurosurgery;  Laterality: N/A;  ? MELANOMA EXCISION    ? right leg, [facility] at [facility] and underwent immunotherapy  ? NECK SURGERY    ?  x 1 yrs ago  ? PARS PLANA VITRECTOMY Left 04/04/2020  ? Procedure: PARS PLANA VITRECTOMY WITH 25G REMOVAL/SUTURE INTRAOCULAR LENS;  Surgeon: [name], MD;  Location: [place];  Service: Ophthalmology;  Laterality: Left;  ? REPLACEMENT TOTAL KNEE Right   ? TRANSCAROTID ARTERY REVASCULARIZATION?  Right 03/25/2022  ? Procedure: Right Transcarotid Artery Revascularization;  Surgeon: Serafina Mitchell, MD;   [facility];  Service: Vascular;  Laterality: Right;  ? WISDOM TOOTH EXTRACTION    ? ? ?Social History  ? ?Socioeconomic History  ? Marital status: Married  ?  Spouse name: Detta  ? Number of children: Not on file  ? Years of education: Not on file  ? Highest education level: Not on file  ?Occupational History  ? Not on file  ?Tobacco Use  ? Smoking status: Former  ?  Packs/day: 1.50  ?  Years: 40.00  ?  Pack years: 60.00  ?  Types: Cigarettes  ?  Start date: 11/04/1963  ?  Quit date: 10/29/2003  ?  Years since quitting: 18.5  ?  Passive exposure: Never  ? Smokeless tobacco: Never  ?Vaping Use  ? Vaping Use: Never used  ?Substance and Sexual Activity  ? Alcohol use: Yes  ?  Alcohol/week: 15.0 standard drinks  ?  Types: 14 Cans of beer, 1 Standard drinks or equivalent per week  ?  Comment: 2 beers or a mixed drink  ? Drug use: No  ? Sexual activity: Not on file  ?Other Topics Concern  ? Not on file  ?Social History Narrative  ? Lives with wife  ? ?Social Determinants of Health  ? ?Financial Resource Strain: Not on file  ?Food Insecurity: Not on file  ?Transportation Needs: Not on file  ?Physical Activity: Not on file  ?Stress: Not on file  ?Social Connections: Not on file  ?Intimate Partner Violence: Not on file  ? ? ?Family History  ?Problem Relation Age of Onset  ? Heart disease Mother   ? Heart attack Mother   ? Colon polyps Mother   ? Colon polyps Brother   ? Heart attack Maternal Grandmother   ? Heart attack Maternal Grandfather   ? Colon cancer Neg Hx   ? ? ?Current Outpatient Medications  ?Medication Sig Dispense Refill  ? aspirin EC 81 MG tablet Take 81 mg by mouth daily. Swallow whole.    ? atorvastatin (LIPITOR) 20 MG tablet TAKE 1 TABLET BY MOUTH DAILY (Patient taking differently: Take 20 mg by mouth daily.) 90 tablet 1  ? clopidogrel (PLAVIX) 75 MG tablet Take 1 tablet (75 mg total) by mouth daily. 30 tablet 11  ? colchicine 0.6 MG tablet Take 0.6 mg by mouth daily as needed (Gout).    ? cyclobenzaprine  (FLEXERIL) 10 MG tablet Take 10 mg by mouth every 8 (eight) hours as needed for muscle spasms.    ? cycloSPORINE (RESTASIS) 0.05 % ophthalmic emulsion Place 1 drop into both eyes daily.    ? fluticasone (FLONASE) 50 MCG/ACT nasal spray  Place 2 sprays into both nostrils daily.    ? furosemide (LASIX) 40 MG tablet Take 1 tablet (40 mg total) by mouth daily. 30 tablet 1  ? latanoprost (XALATAN) 0.005 % ophthalmic solution Place 1 drop into the left eye at bedtime.    ? levothyroxine (SYNTHROID, LEVOTHROID) 150 MCG tablet Take 150 mcg by mouth See admin instructions. Alternates taking 150 mg daily for 2 days then 175 mcg the third day then repeat  1  ? levothyroxine (SYNTHROID, LEVOTHROID) 175 MCG tablet Take 175 mcg by mouth See admin instructions. Alternates taking 150 mg daily for 2 days then 175 mcg the third day then repeat    ? lisinopril (ZESTRIL) 2.5 MG tablet Take 1 tablet (2.5 mg total) by mouth daily. 30 tablet 1  ? metFORMIN (GLUCOPHAGE) 500 MG tablet Take 500 mg by mouth daily.    ? mometasone (ELOCON) 0.1 % ointment Apply 1 application. topically at bedtime.    ? naproxen sodium (ANAPROX) 220 MG tablet Take 220 mg by mouth daily as needed (for pain.).     ? nitroGLYCERIN (NITROSTAT) 0.4 MG SL tablet DISSOLVE 1 TABLET UNDER THE TONGUE EVERY 5 MINUTES AS NEEDED FOR CHEST PAIN. DO NOT EXCEED A TOTAL OF 3 DOSES IN 15 MINUTES. (Patient taking differently: Place 0.4 mg under the tongue every 5 (five) minutes as needed for chest pain.) 25 tablet 3  ? prednisoLONE acetate (PRED FORTE) 1 % ophthalmic suspension Place 1 drop into the left eye daily.    ? tamsulosin (FLOMAX) 0.4 MG CAPS capsule Take 0.8 mg by mouth at bedtime.    ? ?No current facility-administered medications for this visit.  ? ? ?Allergies  ?Allergen Reactions  ? Erythromycin Swelling  ?  swelling of lips  ? Shellfish Allergy Rash and Other (See Comments)  ?  Gout flares  ? Azithromycin Swelling  ? ? ? ?REVIEW OF SYSTEMS:  ?'[X]'$  denotes positive  finding, '[ ]'$  denotes negative finding ?Cardiac  Comments:  ?Chest pain or chest pressure:    ?Shortness of breath upon exertion: X   ?Short of breath when lying flat:    ?Irregular heart rhythm:    ?    ?Vascu

## 2022-05-03 DIAGNOSIS — I10 Essential (primary) hypertension: Secondary | ICD-10-CM | POA: Diagnosis not present

## 2022-05-03 DIAGNOSIS — J9601 Acute respiratory failure with hypoxia: Secondary | ICD-10-CM | POA: Diagnosis not present

## 2022-05-03 DIAGNOSIS — E119 Type 2 diabetes mellitus without complications: Secondary | ICD-10-CM | POA: Diagnosis not present

## 2022-05-03 DIAGNOSIS — E1151 Type 2 diabetes mellitus with diabetic peripheral angiopathy without gangrene: Secondary | ICD-10-CM | POA: Diagnosis not present

## 2022-05-03 DIAGNOSIS — I5033 Acute on chronic diastolic (congestive) heart failure: Secondary | ICD-10-CM | POA: Diagnosis not present

## 2022-05-03 DIAGNOSIS — I779 Disorder of arteries and arterioles, unspecified: Secondary | ICD-10-CM | POA: Diagnosis not present

## 2022-05-03 DIAGNOSIS — Z95828 Presence of other vascular implants and grafts: Secondary | ICD-10-CM | POA: Diagnosis not present

## 2022-05-03 DIAGNOSIS — G459 Transient cerebral ischemic attack, unspecified: Secondary | ICD-10-CM | POA: Diagnosis not present

## 2022-05-03 DIAGNOSIS — I25119 Atherosclerotic heart disease of native coronary artery with unspecified angina pectoris: Secondary | ICD-10-CM | POA: Diagnosis not present

## 2022-05-04 ENCOUNTER — Ambulatory Visit (INDEPENDENT_AMBULATORY_CARE_PROVIDER_SITE_OTHER): Payer: Medicare Other | Admitting: Physician Assistant

## 2022-05-04 ENCOUNTER — Ambulatory Visit (HOSPITAL_COMMUNITY)
Admission: RE | Admit: 2022-05-04 | Discharge: 2022-05-04 | Disposition: A | Discharge status: Home / Self Care | Payer: Medicare Other | Source: Ambulatory Visit | Attending: Surgery | Admitting: Surgery

## 2022-05-04 ENCOUNTER — Ambulatory Visit: Payer: Medicare Other | Admitting: Physician Assistant

## 2022-05-04 VITALS — BP 118/66 | HR 67 | Temp 98.3°F | Resp 20 | Ht 68.0 in | Wt 167.0 lb

## 2022-05-04 DIAGNOSIS — I6521 Occlusion and stenosis of right carotid artery: Secondary | ICD-10-CM | POA: Diagnosis not present

## 2022-05-04 DIAGNOSIS — I6522 Occlusion and stenosis of left carotid artery: Secondary | ICD-10-CM

## 2022-05-05 ENCOUNTER — Encounter: Payer: Self-pay | Admitting: Physician Assistant

## 2022-05-05 ENCOUNTER — Other Ambulatory Visit (HOSPITAL_COMMUNITY)
Admission: RE | Admit: 2022-05-05 | Discharge: 2022-05-05 | Disposition: A | Discharge status: Home / Self Care | Payer: Medicare Other | Source: Ambulatory Visit | Attending: Physician Assistant | Admitting: Physician Assistant

## 2022-05-05 ENCOUNTER — Ambulatory Visit: Payer: Medicare Other | Admitting: Physician Assistant

## 2022-05-05 VITALS — BP 112/62 | HR 89 | Ht 68.0 in | Wt 167.0 lb

## 2022-05-05 DIAGNOSIS — J449 Chronic obstructive pulmonary disease, unspecified: Secondary | ICD-10-CM

## 2022-05-05 DIAGNOSIS — I2510 Atherosclerotic heart disease of native coronary artery without angina pectoris: Secondary | ICD-10-CM | POA: Diagnosis not present

## 2022-05-05 DIAGNOSIS — I5032 Chronic diastolic (congestive) heart failure: Secondary | ICD-10-CM

## 2022-05-05 DIAGNOSIS — G459 Transient cerebral ischemic attack, unspecified: Secondary | ICD-10-CM

## 2022-05-05 DIAGNOSIS — I6529 Occlusion and stenosis of unspecified carotid artery: Secondary | ICD-10-CM

## 2022-05-05 DIAGNOSIS — I10 Essential (primary) hypertension: Secondary | ICD-10-CM

## 2022-05-05 LAB — BASIC METABOLIC PANEL
Anion gap: 8 (ref 5–15)
BUN: 21 mg/dL (ref 8–23)
CO2: 27 mmol/L (ref 22–32)
Calcium: 9.3 mg/dL (ref 8.9–10.3)
Chloride: 103 mmol/L (ref 98–111)
Creatinine, Ser: 0.93 mg/dL (ref 0.61–1.24)
GFR, Estimated: >60 mL/min (ref >60)
Glucose, Bld: 101 mg/dL — ABNORMAL HIGH (ref 70–99)
Potassium: 3.5 mmol/L (ref 3.5–5.1)
Sodium: 138 mmol/L (ref 135–145)

## 2022-05-05 NOTE — Patient Instructions (Signed)
Medication Instructions:  ?Your physician recommends that you continue on your current medications as directed. Please refer to the Current Medication list given to you today. ? ?*If you need a refill on your cardiac medications before your next appointment, please call your pharmacy* ? ? ?Lab Work: ?Your physician recommends that you return for lab work today. BMET  ? ?If you have labs (blood work) drawn today and your tests are completely normal, you will receive your results only by: ?MyChart Message (if you have MyChart) OR ?A paper copy in the mail ?If you have any lab test that is abnormal or we need to change your treatment, we will call you to review the results. ? ? ?Testing/Procedures: ?NONE  ? ? ?Follow-Up: ?At [facility], you and your health needs are our priority.  As part of our continuing mission to provide you with exceptional heart care, we have created designated Provider Care Teams.  These Care Teams include your primary Cardiologist (physician) and Advanced Practice Providers (APPs -  Physician Assistants and Nurse Practitioners) who all work together to provide you with the care you need, when you need it. ? ?We recommend signing up for the patient portal called "MyChart".  Sign up information is provided on this After Visit Summary.  MyChart is used to connect with patients for Virtual Visits (Telemedicine).  Patients are able to view lab/test results, encounter notes, upcoming appointments, etc.  Non-urgent messages can be sent to your provider as well.   ?To learn more about what you can do with MyChart, go to [email or web address].   ? ?Your next appointment:   ? As scheduled  ? ?The format for your next appointment:   ?In Person ? ?Provider:   ?Rozann Lesches, MD  ? ? ?Other Instructions ?Thank you for choosing [facility]! ? ? ? ?Important Information About Sugar ? ? ? ? ? ? ?

## 2022-05-06 ENCOUNTER — Ambulatory Visit: Payer: Medicare Other | Admitting: Pulmonary Disease

## 2022-05-06 ENCOUNTER — Encounter: Payer: Self-pay | Admitting: Pulmonary Disease

## 2022-05-06 DIAGNOSIS — J449 Chronic obstructive pulmonary disease, unspecified: Secondary | ICD-10-CM | POA: Insufficient documentation

## 2022-05-06 DIAGNOSIS — J9611 Chronic respiratory failure with hypoxia: Secondary | ICD-10-CM

## 2022-05-06 MED ORDER — BREZTRI AEROSPHERE 160-9-4.8 MCG/ACT IN AERO: 2.0000 | INHALATION_SPRAY | Freq: Two times a day (BID) | RESPIRATORY_TRACT | 0 refills | Status: DC

## 2022-05-06 NOTE — Patient Instructions (Signed)
?  X Schedule PFTs ? ?X Ambulatory sat ? ?X sample of  ?

## 2022-05-06 NOTE — Assessment & Plan Note (Addendum)
Clearly his second admission was for acute diastolic heart failure and fluid retention,  improved with diuresis but hypoxia persisted ?He likely has underlying COPD ? ?On ambulation, saturation dropped to 92%.  He can stay off oxygen for longer periods.  We will reassess in 2 months ?

## 2022-05-06 NOTE — Progress Notes (Signed)
? ?Subjective:  ? ? Patient ID: Christopher Randolph, male    DOB: [birth year], 76 y.o.   MRN: [id number] ? ?HPI ? ?76 year old heavy ex-smoker from [place] referred for evaluation of COPD and chronic hypoxic respiratory failure. ? ?He smoked for more than 80 pack years before he quit in 2004 ? ?PMH -CAD status post stent 2004 ?Hypertension, diabetes type 2 ?TIA 02/2022 right carotid stent ? ?He was hospitalized with a TIA in March when he presented with amaurosis fugax, he was found to have a right carotid 70% stenosis and underwent stent placement.  He developed hypotension in recovery and required fluids to stabilize.  Blood pressure medications were stopped. ?He was readmitted 03/31/2022 with CHF exacerbation, weight had increased to 180 pounds, he was diuresed to 167 on discharge and required oxygen on discharge. ?Chest x-ray 4/3 independently reviewed shows bibasal atelectasis ?CT angiogram was negative for PE ?I have reviewed discharge summary and cardiology consultation on 5/9. ?Labs reviewed which showed BUN/creatinine 21/0.9 and potassium 3.5 ? ?He reports that albuterol made him jittery in the remote past.  He arrives on oxygen.  He has been able to stay off oxygen for short periods at home, lowest desat was 88% after ambulation ? ? ?Significant tests/ events reviewed ? ?CT angiogram chest 03/2022 right lower lobe atelectasis/effusion ? ? ?Past Medical History:  ?Diagnosis Date  ? Arthritis   ? Brain TIA 02/26/2022  ? CAD (coronary artery disease)   ? Branch vessel and moderate RCA disease 2006  ? Cancer [facility]) 1990  ? Melanoma Lower right Leg  ? Carotid artery disease ([place])   ? Complication of anesthesia 2020  ? blood presure dropped when  started to put payient , blood preesure.  ? COPD (chronic obstructive pulmonary disease) ([place])   ? Essential hypertension   ? Glaucoma   ? Hyperlipidemia   ? Hypothyroidism   ? Lyme disease   ? PAD (peripheral artery disease) ([place])   ? Left common iliac stent 2004  ? Pneumonia   ?  TIA (transient ischemic attack) 02/2022  ? Type 2 diabetes mellitus ([place])   ? Wears dentures   ? Wears glasses   ? ? ?Past Surgical History:  ?Procedure Laterality Date  ? CATARACT EXTRACTION Bilateral   ? CATARACT EXTRACTION, BILATERAL    ? CERVICAL DISC SURGERY    ? x 1  ? COLONOSCOPY N/A 11/06/2016  ? Procedure: COLONOSCOPY;  Surgeon: Daneil Dolin, MD;  [facility];  Service: Endoscopy;  Laterality: N/A;  7:30 AM  ? COLONOSCOPY  2012  ? Dr. Anthony Sar: normal. reviewed reports, which states he has a history of polyps in remote past.   ? ELBOW SURGERY Left   ? EYE SURGERY Bilateral   ? Cat Sx  ? EYE SURGERY Left 01/14/2021  ? Shunt - Dr. Jovita Kussmaul  ? IRIDOTOMY / IRIDECTOMY Left 01/14/2021  ? Shunt - Dr. Jovita Kussmaul  ? JOINT REPLACEMENT    ? LUMBAR WOUND DEBRIDEMENT N/A 01/24/2019  ? Procedure: LUMBAR WOUND Exploration for Evacation of Seroma vs. Hematoma;  Surgeon: Jovita Gamma, MD;  Location: [place];  Service: Neurosurgery;  Laterality: N/A;  ? MELANOMA EXCISION    ? right leg, seen at [facility] and underwent immunotherapy  ? NECK SURGERY    ?  x 1 yrs ago  ? PARS PLANA VITRECTOMY Left 04/04/2020  ? Procedure: PARS PLANA VITRECTOMY WITH 25G REMOVAL/SUTURE INTRAOCULAR LENS;  Surgeon: [name], MD;  Location: [place];  Service: Ophthalmology;  Laterality: Left;  ? REPLACEMENT TOTAL KNEE Right   ? TRANSCAROTID ARTERY REVASCULARIZATION?  Right 03/25/2022  ? Procedure: Right Transcarotid Artery Revascularization;  Surgeon: Serafina Mitchell, MD;  Location: [facility] OR;  Service: Vascular;  Laterality: Right;  ? WISDOM TOOTH EXTRACTION    ? ? ?Allergies  ?Allergen Reactions  ? Erythromycin Swelling  ?  swelling of lips  ? Shellfish Allergy Rash and Other (See Comments)  ?  Gout flares  ? Azithromycin Swelling  ? ? ?Social History  ? ?Socioeconomic History  ? Marital status: Married  ?  Spouse name: Detta  ? Number of children: Not on file  ? Years of education: Not on file  ? Highest education level: Not on file   ?Occupational History  ? Not on file  ?Tobacco Use  ? Smoking status: Former  ?  Packs/day: 1.50  ?  Years: 40.00  ?  Pack years: 60.00  ?  Types: Cigarettes  ?  Start date: 11/04/1963  ?  Quit date: 10/29/2003  ?  Years since quitting: 18.5  ?  Passive exposure: Never  ? Smokeless tobacco: Never  ?Vaping Use  ? Vaping Use: Never used  ?Substance and Sexual Activity  ? Alcohol use: Yes  ?  Alcohol/week: 15.0 standard drinks  ?  Types: 14 Cans of beer, 1 Standard drinks or equivalent per week  ?  Comment: 2 beers or a mixed drink  ? Drug use: No  ? Sexual activity: Not on file  ?Other Topics Concern  ? Not on file  ?Social History Narrative  ? Lives with wife  ? ?Social Determinants of Health  ? ?Financial Resource Strain: Not on file  ?Food Insecurity: Not on file  ?Transportation Needs: Not on file  ?Physical Activity: Not on file  ?Stress: Not on file  ?Social Connections: Not on file  ?Intimate Partner Violence: Not on file  ? ? ? ?Family History  ?Problem Relation Age of Onset  ? Heart disease Mother   ? Heart attack Mother   ? Colon polyps Mother   ? Colon polyps Brother   ? Heart attack Maternal Grandmother   ? Heart attack Maternal Grandfather   ? Colon cancer Neg Hx   ? ? ? ? ?Review of Systems ?Constitutional: negative for anorexia, fevers and sweats  ?Eyes: negative for irritation, redness and visual disturbance  ?Ears, nose, mouth, throat, and face: negative for earaches, epistaxis, nasal congestion and sore throat  ?Respiratory: negative for cough, sputum and wheezing  ?Cardiovascular: negative for chest pain,orthopnea, palpitations and syncope  ?Gastrointestinal: negative for abdominal pain, constipation, diarrhea, melena, nausea and vomiting  ?Genitourinary:negative for dysuria, frequency and hematuria  ?Hematologic/lymphatic: negative for bleeding, easy bruising and lymphadenopathy  ?Musculoskeletal:negative for arthralgias, muscle weakness and stiff joints  ?Neurological: negative for coordination  problems, gait problems, headaches and weakness  ?Endocrine: negative for diabetic symptoms including polydipsia, polyuria and weight loss ? ?   ?Objective:  ? Physical Exam ? ?Gen. Pleasant, well-nourished, in no distress, anxious affect ?ENT - no pallor,icterus, no post nasal drip ?Neck: No JVD, no thyromegaly, no carotid bruits ?Lungs: no use of accessory muscles, no dullness to percussion, decreased breath sounds bilateral without rales or rhonchi  ?Cardiovascular: Rhythm regular, heart sounds  normal, no murmurs or gallops, no peripheral edema ?Abdomen: soft and non-tender, no hepatosplenomegaly, BS normal. ?Musculoskeletal: No deformities, no cyanosis or clubbing ?Neuro:  alert, non focal ? ? ? ?   ?Assessment & Plan:  ? ? ?

## 2022-05-06 NOTE — Assessment & Plan Note (Signed)
Interestingly CT angiogram chest was reviewed which does not show significant emphysema.  We will obtain PFTs to quantify lung function and degree of airway obstruction. ?Meantime we will give him a sample of Breztri to use and based on PFT decide whether he needs long-term bronchodilators. ?He did not tolerate albuterol in the remote past but is willing to try again if needed ?

## 2022-05-07 ENCOUNTER — Ambulatory Visit (INDEPENDENT_AMBULATORY_CARE_PROVIDER_SITE_OTHER): Payer: Medicare Other | Admitting: Pulmonary Disease

## 2022-05-07 DIAGNOSIS — J9611 Chronic respiratory failure with hypoxia: Secondary | ICD-10-CM

## 2022-05-07 DIAGNOSIS — J449 Chronic obstructive pulmonary disease, unspecified: Secondary | ICD-10-CM

## 2022-05-07 LAB — PULMONARY FUNCTION TEST
DL/VA % pred: 88 %
DL/VA: 3.6 ml/min/mmHg/L
DLCO cor % pred: 75 %
DLCO cor: 16.84 ml/min/mmHg
DLCO unc % pred: 70 %
DLCO unc: 15.86 ml/min/mmHg
FEF 25-75 Post: 1.36 L/sec
FEF 25-75 Pre: 1.01 L/sec
FEF2575-%Change-Post: 34 %
FEF2575-%Pred-Post: 69 %
FEF2575-%Pred-Pre: 51 %
FEV1-%Change-Post: 8 %
FEV1-%Pred-Post: 67 %
FEV1-%Pred-Pre: 62 %
FEV1-Post: 1.79 L
FEV1-Pre: 1.65 L
FEV1FVC-%Change-Post: 0 %
FEV1FVC-%Pred-Pre: 95 %
FEV6-%Change-Post: 8 %
FEV6-%Pred-Post: 75 %
FEV6-%Pred-Pre: 69 %
FEV6-Post: 2.57 L
FEV6-Pre: 2.37 L
FEV6FVC-%Pred-Post: 107 %
FEV6FVC-%Pred-Pre: 107 %
FVC-%Change-Post: 8 %
FVC-%Pred-Post: 70 %
FVC-%Pred-Pre: 64 %
FVC-Post: 2.57 L
FVC-Pre: 2.37 L
Post FEV1/FVC ratio: 70 %
Post FEV6/FVC ratio: 100 %
Pre FEV1/FVC ratio: 70 %
Pre FEV6/FVC Ratio: 100 %
RV % pred: 127 %
RV: 2.93 L
TLC % pred: 85 %
TLC: 5.35 L

## 2022-05-07 NOTE — Progress Notes (Signed)
PFT done today. 

## 2022-05-08 ENCOUNTER — Other Ambulatory Visit: Payer: Self-pay | Admitting: *Deleted

## 2022-05-08 DIAGNOSIS — I6521 Occlusion and stenosis of right carotid artery: Secondary | ICD-10-CM

## 2022-05-08 DIAGNOSIS — I6522 Occlusion and stenosis of left carotid artery: Secondary | ICD-10-CM

## 2022-05-12 ENCOUNTER — Telehealth: Payer: Self-pay | Admitting: Pulmonary Disease

## 2022-05-12 NOTE — Telephone Encounter (Signed)
Called and gave results to patient.  ?He voiced understanding.  ? ?Patient asked for clarification on last OV note about oxygen use.  ?Read note for him about not using O2 for longer periods and then will reassess in July.  ?Patient voiced understanding. Nothing further needed.  ?

## 2022-05-12 NOTE — Telephone Encounter (Signed)
PFT was done on 05/07/22 patient calling about PFT results.  ? ?Dr. Elsworth Soho please advise  ?

## 2022-05-19 DIAGNOSIS — G459 Transient cerebral ischemic attack, unspecified: Secondary | ICD-10-CM | POA: Diagnosis not present

## 2022-05-19 DIAGNOSIS — I10 Essential (primary) hypertension: Secondary | ICD-10-CM | POA: Diagnosis not present

## 2022-05-19 DIAGNOSIS — I25119 Atherosclerotic heart disease of native coronary artery with unspecified angina pectoris: Secondary | ICD-10-CM | POA: Diagnosis not present

## 2022-05-19 DIAGNOSIS — I779 Disorder of arteries and arterioles, unspecified: Secondary | ICD-10-CM | POA: Diagnosis not present

## 2022-05-19 DIAGNOSIS — E1151 Type 2 diabetes mellitus with diabetic peripheral angiopathy without gangrene: Secondary | ICD-10-CM | POA: Diagnosis not present

## 2022-05-19 DIAGNOSIS — I5033 Acute on chronic diastolic (congestive) heart failure: Secondary | ICD-10-CM | POA: Diagnosis not present

## 2022-05-19 DIAGNOSIS — Z95828 Presence of other vascular implants and grafts: Secondary | ICD-10-CM | POA: Diagnosis not present

## 2022-05-19 DIAGNOSIS — J9601 Acute respiratory failure with hypoxia: Secondary | ICD-10-CM | POA: Diagnosis not present

## 2022-05-27 ENCOUNTER — Observation Stay (HOSPITAL_COMMUNITY)
Admission: EM | Admit: 2022-05-27 | Discharge: 2022-05-28 | Disposition: A | Discharge status: Home / Self Care | Payer: Medicare Other | Attending: Family Medicine | Admitting: Family Medicine

## 2022-05-27 ENCOUNTER — Other Ambulatory Visit: Payer: Self-pay

## 2022-05-27 ENCOUNTER — Emergency Department (HOSPITAL_COMMUNITY): Payer: Medicare Other

## 2022-05-27 ENCOUNTER — Encounter (HOSPITAL_COMMUNITY): Payer: Self-pay | Admitting: *Deleted

## 2022-05-27 DIAGNOSIS — I2510 Atherosclerotic heart disease of native coronary artery without angina pectoris: Secondary | ICD-10-CM | POA: Diagnosis not present

## 2022-05-27 DIAGNOSIS — I779 Disorder of arteries and arterioles, unspecified: Secondary | ICD-10-CM | POA: Diagnosis present

## 2022-05-27 DIAGNOSIS — E1169 Type 2 diabetes mellitus with other specified complication: Secondary | ICD-10-CM | POA: Diagnosis not present

## 2022-05-27 DIAGNOSIS — R011 Cardiac murmur, unspecified: Secondary | ICD-10-CM | POA: Diagnosis present

## 2022-05-27 DIAGNOSIS — R0789 Other chest pain: Secondary | ICD-10-CM | POA: Diagnosis not present

## 2022-05-27 DIAGNOSIS — Z85828 Personal history of other malignant neoplasm of skin: Secondary | ICD-10-CM | POA: Insufficient documentation

## 2022-05-27 DIAGNOSIS — I5032 Chronic diastolic (congestive) heart failure: Secondary | ICD-10-CM

## 2022-05-27 DIAGNOSIS — R079 Chest pain, unspecified: Secondary | ICD-10-CM | POA: Diagnosis not present

## 2022-05-27 DIAGNOSIS — Z7982 Long term (current) use of aspirin: Secondary | ICD-10-CM | POA: Diagnosis not present

## 2022-05-27 DIAGNOSIS — I10 Essential (primary) hypertension: Secondary | ICD-10-CM | POA: Diagnosis present

## 2022-05-27 DIAGNOSIS — Z96651 Presence of right artificial knee joint: Secondary | ICD-10-CM | POA: Diagnosis not present

## 2022-05-27 DIAGNOSIS — Z8673 Personal history of transient ischemic attack (TIA), and cerebral infarction without residual deficits: Secondary | ICD-10-CM | POA: Insufficient documentation

## 2022-05-27 DIAGNOSIS — Z7984 Long term (current) use of oral hypoglycemic drugs: Secondary | ICD-10-CM | POA: Diagnosis not present

## 2022-05-27 DIAGNOSIS — Z87891 Personal history of nicotine dependence: Secondary | ICD-10-CM | POA: Diagnosis not present

## 2022-05-27 DIAGNOSIS — J449 Chronic obstructive pulmonary disease, unspecified: Secondary | ICD-10-CM | POA: Diagnosis not present

## 2022-05-27 DIAGNOSIS — E119 Type 2 diabetes mellitus without complications: Secondary | ICD-10-CM | POA: Diagnosis not present

## 2022-05-27 DIAGNOSIS — E039 Hypothyroidism, unspecified: Secondary | ICD-10-CM | POA: Diagnosis not present

## 2022-05-27 DIAGNOSIS — I110 Hypertensive heart disease with heart failure: Secondary | ICD-10-CM | POA: Insufficient documentation

## 2022-05-27 DIAGNOSIS — I509 Heart failure, unspecified: Secondary | ICD-10-CM | POA: Diagnosis not present

## 2022-05-27 DIAGNOSIS — Z7902 Long term (current) use of antithrombotics/antiplatelets: Secondary | ICD-10-CM | POA: Insufficient documentation

## 2022-05-27 DIAGNOSIS — Z79899 Other long term (current) drug therapy: Secondary | ICD-10-CM | POA: Diagnosis not present

## 2022-05-27 LAB — D-DIMER, QUANTITATIVE: D-Dimer, Quant: 0.33 ug/mL-FEU (ref 0.00–0.50)

## 2022-05-27 LAB — CBC
HCT: 42.2 % (ref 39.0–52.0)
Hemoglobin: 14.3 g/dL (ref 13.0–17.0)
MCH: 30.2 pg (ref 26.0–34.0)
MCHC: 33.9 g/dL (ref 30.0–36.0)
MCV: 89.2 fL (ref 80.0–100.0)
Platelets: 153 10*3/uL (ref 150–400)
RBC: 4.73 MIL/uL (ref 4.22–5.81)
RDW: 14.2 % (ref 11.5–15.5)
WBC: 4.8 10*3/uL (ref 4.0–10.5)
nRBC: 0 % (ref 0.0–0.2)

## 2022-05-27 LAB — TROPONIN I (HIGH SENSITIVITY)
Troponin I (High Sensitivity): 4 ng/L (ref <18)
Troponin I (High Sensitivity): 4 ng/L (ref <18)

## 2022-05-27 LAB — BASIC METABOLIC PANEL
Anion gap: 7 (ref 5–15)
BUN: 18 mg/dL (ref 8–23)
CO2: 26 mmol/L (ref 22–32)
Calcium: 9.2 mg/dL (ref 8.9–10.3)
Chloride: 105 mmol/L (ref 98–111)
Creatinine, Ser: 0.95 mg/dL (ref 0.61–1.24)
GFR, Estimated: >60 mL/min (ref >60)
Glucose, Bld: 117 mg/dL — ABNORMAL HIGH (ref 70–99)
Potassium: 3.5 mmol/L (ref 3.5–5.1)
Sodium: 138 mmol/L (ref 135–145)

## 2022-05-27 LAB — BRAIN NATRIURETIC PEPTIDE: B Natriuretic Peptide: 43 pg/mL (ref 0.0–100.0)

## 2022-05-27 LAB — MAGNESIUM: Magnesium: 1.7 mg/dL (ref 1.7–2.4)

## 2022-05-27 MED ORDER — MORPHINE SULFATE (PF) 2 MG/ML IV SOLN
2.0000 mg | INTRAVENOUS | Status: DC | PRN
  Administered 2022-05-27: 2 mg via INTRAVENOUS
  Filled 2022-05-27: qty 1

## 2022-05-27 MED ORDER — ENOXAPARIN SODIUM 40 MG/0.4ML IJ SOSY
40.0000 mg | PREFILLED_SYRINGE | INTRAMUSCULAR | Status: DC
  Administered 2022-05-27: 40 mg via SUBCUTANEOUS
  Filled 2022-05-27: qty 0.4

## 2022-05-27 MED ORDER — PREDNISOLONE ACETATE 1 % OP SUSP
1.0000 [drp] | Freq: Every day | OPHTHALMIC | Status: DC
Start: 2022-05-28 — End: 2022-05-28
  Administered 2022-05-28: 1 [drp] via OPHTHALMIC
  Filled 2022-05-27: qty 1

## 2022-05-27 MED ORDER — TAMSULOSIN HCL 0.4 MG PO CAPS
0.8000 mg | ORAL_CAPSULE | Freq: Every day | ORAL | Status: DC
  Administered 2022-05-27: 0.8 mg via ORAL
  Filled 2022-05-27: qty 2

## 2022-05-27 MED ORDER — LEVOTHYROXINE SODIUM 75 MCG PO TABS: 150.0000 ug | ORAL_TABLET | ORAL | Status: DC

## 2022-05-27 MED ORDER — POLYETHYLENE GLYCOL 3350 17 G PO PACK: 17.0000 g | PACK | Freq: Every day | ORAL | Status: DC | PRN

## 2022-05-27 MED ORDER — ONDANSETRON HCL 4 MG/2ML IJ SOLN: 4.0000 mg | Freq: Four times a day (QID) | INTRAMUSCULAR | Status: DC | PRN

## 2022-05-27 MED ORDER — NITROGLYCERIN 0.4 MG SL SUBL
SUBLINGUAL_TABLET | SUBLINGUAL | Status: AC
  Filled 2022-05-27: qty 3

## 2022-05-27 MED ORDER — ASPIRIN 81 MG PO CHEW
324.0000 mg | CHEWABLE_TABLET | Freq: Once | ORAL | Status: AC
  Administered 2022-05-27: 324 mg via ORAL

## 2022-05-27 MED ORDER — ACETAMINOPHEN 325 MG PO TABS: 650.0000 mg | ORAL_TABLET | Freq: Four times a day (QID) | ORAL | Status: DC | PRN

## 2022-05-27 MED ORDER — ASPIRIN 81 MG PO CHEW
CHEWABLE_TABLET | ORAL | Status: AC
  Filled 2022-05-27: qty 4

## 2022-05-27 MED ORDER — NITROGLYCERIN 0.4 MG SL SUBL
0.4000 mg | SUBLINGUAL_TABLET | Freq: Once | SUBLINGUAL | Status: AC
  Administered 2022-05-27: 0.4 mg via SUBLINGUAL

## 2022-05-27 MED ORDER — MORPHINE SULFATE (PF) 2 MG/ML IV SOLN
2.0000 mg | Freq: Once | INTRAVENOUS | Status: AC
  Administered 2022-05-27: 2 mg via INTRAVENOUS
  Filled 2022-05-27: qty 1

## 2022-05-27 MED ORDER — MOMETASONE FUROATE 0.1 % EX CREA
1.0000 "application " | TOPICAL_CREAM | Freq: Every day | CUTANEOUS | Status: DC
  Filled 2022-05-27: qty 15

## 2022-05-27 MED ORDER — ASPIRIN 81 MG PO TBEC
81.0000 mg | DELAYED_RELEASE_TABLET | Freq: Every day | ORAL | Status: DC
  Filled 2022-05-27: qty 1

## 2022-05-27 MED ORDER — ONDANSETRON HCL 4 MG PO TABS: 4.0000 mg | ORAL_TABLET | Freq: Four times a day (QID) | ORAL | Status: DC | PRN

## 2022-05-27 MED ORDER — ATORVASTATIN CALCIUM 10 MG PO TABS
20.0000 mg | ORAL_TABLET | Freq: Every day | ORAL | Status: DC
  Filled 2022-05-27: qty 2

## 2022-05-27 MED ORDER — LEVOTHYROXINE SODIUM 75 MCG PO TABS: 175.0000 ug | ORAL_TABLET | ORAL | Status: DC

## 2022-05-27 MED ORDER — LEVOTHYROXINE SODIUM 75 MCG PO TABS
150.0000 ug | ORAL_TABLET | ORAL | Status: DC
  Administered 2022-05-28: 150 ug via ORAL
  Filled 2022-05-27: qty 2

## 2022-05-27 MED ORDER — ACETAMINOPHEN 650 MG RE SUPP: 650.0000 mg | Freq: Four times a day (QID) | RECTAL | Status: DC | PRN

## 2022-05-27 MED ORDER — FLUTICASONE PROPIONATE 50 MCG/ACT NA SUSP
2.0000 | Freq: Every day | NASAL | Status: DC
  Administered 2022-05-28: 2 via NASAL
  Filled 2022-05-27: qty 16

## 2022-05-27 MED ORDER — CLOPIDOGREL BISULFATE 75 MG PO TABS
75.0000 mg | ORAL_TABLET | Freq: Every day | ORAL | Status: DC
  Filled 2022-05-27: qty 1

## 2022-05-27 MED ORDER — MORPHINE SULFATE (PF) 4 MG/ML IV SOLN
4.0000 mg | Freq: Once | INTRAVENOUS | Status: AC
  Administered 2022-05-27: 4 mg via INTRAVENOUS
  Filled 2022-05-27: qty 1

## 2022-05-27 MED ORDER — POTASSIUM CHLORIDE CRYS ER 20 MEQ PO TBCR
40.0000 meq | EXTENDED_RELEASE_TABLET | Freq: Once | ORAL | Status: AC
  Administered 2022-05-27: 40 meq via ORAL
  Filled 2022-05-27: qty 2

## 2022-05-27 NOTE — Assessment & Plan Note (Addendum)
Stable and compensated.  Blood pressure dropped in the ED with nitro.  Systolic down to 61B subsequently improved without intervention.  Echo 02/2022 EF of 60 to 65% with G2DD. -Hold Lasix, lisinopril for now -Resume aspirin, Plavix

## 2022-05-27 NOTE — ED Provider Notes (Signed)
[facility] EMERGENCY DEPARTMENT Provider Note   CSN: [id number] Arrival date & time: 05/27/22  1330     History  Chief Complaint  Patient presents with   Chest Pain    Christopher Randolph is a 76 y.o. male.   Chest Pain Associated symptoms: back pain (lower back) and fatigue   Patient presents for chest pain.  Medical history includes PAD, CAD, HTN, hypothyroidism, HLD, lumbar stenosis, TIA, carotid stenosis s/p stenting last month, T2DM, CHF, COPD.  He was previously on home oxygen but was able to wean himself off.  His last heart cath was in 2006 which showed mild OM disease and moderate RCA disease.  He underwent a nuclear stress test 1 year ago that did not reveal any ischemia.  Over the past several weeks, patient has had intermittent left-sided chest pain.  He does feel that it is worsened with exertion.  Over the past several days it has had increasing frequency.  Pain does not radiate.  Patient denies any associated nausea or diaphoresis.  Today, pain has been constant.  Currently, it is 6/10 in severity.  In addition to his pain, he does feel some paresthesias on the left side of his face. HPI: A 76 year old patient with a history of peripheral artery disease, treated diabetes, hypertension and hypercholesterolemia presents for evaluation of chest pain. Initial onset of pain was less than one hour ago. The patient's chest pain is described as heaviness/pressure/tightness and is worse with exertion. The patient's chest pain is middle- or left-sided, is not well-localized, is not sharp and does not radiate to the arms/jaw/neck. The patient does not complain of nausea and denies diaphoresis. The patient has no history of stroke, has not smoked in the past 90 days, has no relevant family history of coronary artery disease (first degree relative at less than age 61) and does not have an elevated BMI (>=30).   Home Medications Prior to Admission medications   Medication Sig Start Date End  Date Taking? Authorizing Provider  aspirin EC 81 MG tablet Take 81 mg by mouth daily. Swallow whole.    [provider]  atorvastatin (LIPITOR) 20 MG tablet TAKE 1 TABLET BY MOUTH DAILY Patient taking differently: Take 20 mg by mouth daily. 12/03/21   Satira Sark, MD  Budeson-Glycopyrrol-Formoterol (BREZTRI AEROSPHERE) 160-9-4.8 MCG/ACT AERO Inhale 2 puffs into the lungs in the morning and at bedtime. 05/06/22   Rigoberto Noel, MD  clopidogrel (PLAVIX) 75 MG tablet Take 1 tablet (75 mg total) by mouth daily. 02/28/22 02/28/23  Kathie Dike, MD  cyclobenzaprine (FLEXERIL) 10 MG tablet Take 10 mg by mouth every 8 (eight) hours as needed for muscle spasms. 09/24/20   [provider]  cycloSPORINE (RESTASIS) 0.05 % ophthalmic emulsion Place 1 drop into both eyes daily.    [provider]  fluticasone (FLONASE) 50 MCG/ACT nasal spray Place 2 sprays into both nostrils daily.    [provider]  furosemide (LASIX) 40 MG tablet Take 1 tablet (40 mg total) by mouth daily. 04/04/22   Orson Eva, MD  latanoprost (XALATAN) 0.005 % ophthalmic solution Place 1 drop into the left eye at bedtime. 02/11/22   [provider]  levothyroxine (SYNTHROID, LEVOTHROID) 150 MCG tablet Take 150 mcg by mouth See admin instructions. Alternates taking 150 mg daily for 2 days then 175 mcg the third day then repeat 11/01/18   [provider]  levothyroxine (SYNTHROID, LEVOTHROID) 175 MCG tablet Take 175 mcg by mouth See admin  instructions. Alternates taking 150 mg daily for 2 days then 175 mcg the third day then repeat    [provider]  lisinopril (ZESTRIL) 2.5 MG tablet Take 1 tablet (2.5 mg total) by mouth daily. 04/04/22   Orson Eva, MD  metFORMIN (GLUCOPHAGE) 500 MG tablet Take 500 mg by mouth daily. 10/19/14   [provider]  mometasone (ELOCON) 0.1 % ointment Apply 1 application. topically at bedtime.    [provider]  nitroGLYCERIN  (NITROSTAT) 0.4 MG SL tablet DISSOLVE 1 TABLET UNDER THE TONGUE EVERY 5 MINUTES AS NEEDED FOR CHEST PAIN. DO NOT EXCEED A TOTAL OF 3 DOSES IN 15 MINUTES. Patient taking differently: Place 0.4 mg under the tongue every 5 (five) minutes as needed for chest pain. 04/18/21   Satira Sark, MD  prednisoLONE acetate (PRED FORTE) 1 % ophthalmic suspension Place 1 drop into the left eye daily. 02/11/22   [provider]  tamsulosin (FLOMAX) 0.4 MG CAPS capsule Take 0.8 mg by mouth at bedtime.    [provider]      Allergies    Erythromycin, Shellfish allergy, and Azithromycin    Review of Systems   Review of Systems  Constitutional:  Positive for fatigue.  Cardiovascular:  Positive for chest pain.  Musculoskeletal:  Positive for back pain (lower back).  Neurological:        Left-sided facial paresthesias  All other systems reviewed and are negative.  Physical Exam Updated Vital Signs BP (!) 163/73 (BP Location: Left Arm)   Pulse 60   Temp 98.1 F (36.7 C) (Oral)   Resp 16   Ht '5\' 8"'$  (1.727 m)   Wt 75.9 kg   SpO2 96%   BMI 25.44 kg/m  Physical Exam Vitals and nursing note reviewed.  Constitutional:      General: He is not in acute distress.    Appearance: He is well-developed and normal weight. He is not ill-appearing, toxic-appearing or diaphoretic.  HENT:     Head: Normocephalic and atraumatic.  Eyes:     Extraocular Movements: Extraocular movements intact.     Conjunctiva/sclera: Conjunctivae normal.  Cardiovascular:     Rate and Rhythm: Normal rate and regular rhythm.     Heart sounds: No murmur heard. Pulmonary:     Effort: Pulmonary effort is normal. No tachypnea or respiratory distress.     Breath sounds: Normal breath sounds. No decreased breath sounds, wheezing, rhonchi or rales.  Chest:     Chest wall: Tenderness (mild) present.  Abdominal:     Palpations: Abdomen is soft.     Tenderness: There is no abdominal tenderness.  Musculoskeletal:         General: No swelling. Normal range of motion.     Cervical back: Neck supple.     Right lower leg: No edema.     Left lower leg: No edema.  Skin:    General: Skin is warm and dry.     Coloration: Skin is not cyanotic or pale.  Neurological:     General: No focal deficit present.     Mental Status: He is alert and oriented to person, place, and time.     Cranial Nerves: No cranial nerve deficit.     Motor: No weakness.  Psychiatric:        Mood and Affect: Mood normal.        Behavior: Behavior normal.    ED Results / Procedures / Treatments   Labs (all labs ordered are  listed, but only abnormal results are displayed) Labs Reviewed  BASIC METABOLIC PANEL - Abnormal; Notable for the following components:      Result Value   Glucose, Bld 117 (*)    All other components within normal limits  CBC  BRAIN NATRIURETIC PEPTIDE  MAGNESIUM  TROPONIN I (HIGH SENSITIVITY)  TROPONIN I (HIGH SENSITIVITY)    EKG EKG Interpretation  Date/Time:  Wednesday May 27 2022 13:37:13 EDT Ventricular Rate:  72 PR Interval:  170 QRS Duration: 80 QT Interval:  374 QTC Calculation: 409 R Axis:   28 Text Interpretation: Normal sinus rhythm Normal ECG When compared with ECG of 30-Mar-2022 17:23, PREVIOUS ECG IS PRESENT Confirmed by Godfrey Pick [phone]) on 05/27/2022 2:18:33 PM  Radiology DG Chest Port 1 View  Result Date: 05/27/2022 CLINICAL DATA:  Left-sided chest pain EXAM: PORTABLE CHEST 1 VIEW COMPARISON:  03/30/2022 FINDINGS: No new consolidation. Mild right basilar atelectasis/scarring. No significant pleural effusion. No pneumothorax. Normal heart size. IMPRESSION: Mild right basilar atelectasis/scarring. Electronically Signed   By: Macy Mis M.D.   On: 05/27/2022 14:05    Procedures Procedures    Medications Ordered in ED Medications  aspirin chewable tablet 324 mg (324 mg Oral Given 05/27/22 1403)  nitroGLYCERIN (NITROSTAT) SL tablet 0.4 mg (0.4 mg Sublingual Given 05/27/22 1404)   morphine (PF) 4 MG/ML injection 4 mg (4 mg Intravenous Given 05/27/22 1454)  morphine (PF) 2 MG/ML injection 2 mg (2 mg Intravenous Given 05/27/22 1630)    ED Course/ Medical Decision Making/ A&P   HEAR Score: 5                       Medical Decision Making Amount and/or Complexity of Data Reviewed Labs: ordered.  Risk OTC drugs. Prescription drug management. Decision regarding hospitalization.   This patient presents to the ED for concern of chest pain, this involves an extensive number of treatment options, and is a complaint that carries with it a high risk of complications and morbidity.  The differential diagnosis includes ACS, pericarditis, pneumonia, GERD, PE   Co morbidities that complicate the patient evaluation  PAD, CAD, HTN, hypothyroidism, HLD, lumbar stenosis, TIA, carotid stenosis s/p stenting last month, T2DM, CHF, COPD   Additional history obtained:  Additional history obtained from patient's wife External records from outside source obtained and reviewed including EMR   Lab Tests:  I Ordered, and personally interpreted labs.  The pertinent results include: Normal hemoglobin, no leukocytosis, normal electrolytes, normal BNP, normal troponin   Imaging Studies ordered:  I ordered imaging studies including chest x-ray I independently visualized and interpreted imaging which showed no acute findings I agree with the radiologist interpretation   Cardiac Monitoring: / EKG:  The patient was maintained on a cardiac monitor.  I personally viewed and interpreted the cardiac monitored which showed an underlying rhythm of: Sinus rhythm   Consultations Obtained:  I requested consultation with the cardiologist, Dr. Harl Bowie,  and discussed lab and imaging findings as well as pertinent plan - they recommend: Admission for observation, echocardiogram tomorrow morning, n.p.o. at midnight.   Problem List / ED Course / Critical interventions / Medication  management  Patient is a 76 year old male with known vascular disease but no history of MIs, presenting for chest pain.  He describes intermittent similar symptoms over the past several weeks which have increased in frequency over the past several days.  Today, his chest pain has been constant.  Pain is located in left central region  of anterior chest.  It does not radiate but patient does describe the paresthesias to the left side of his face.  Vital signs on arrival are normal.  EKG does not show any evidence of ischemia.  Patient was given ASA and NTG.  When he got NTG, his blood pressure dropped significantly.  He was given bolus of IV fluids.  On exam, patient's lungs are clear to auscultation.  He has been diagnosed with COPD but does not utilize breathing treatments or home oxygen.  He does have some mild tenderness in the area of his pain.  Morphine was given for analgesia.  On reassessment, patient's pain improved from a 6 to a 3/10.  Blood pressure is normalized.  Initial troponin was normal.  I discussed this patient with cardiologist on-call Dr. Harl Bowie, who came and evaluated the patient in the ED.  He does recommend admission for observation.  Patient was admitted to hospitalist for further management. I ordered medication including ASA, NTG for possible ACS; morphine for analgesia; IV fluids for hypotension Reevaluation of the patient after these medicines showed that the patient improved I have reviewed the patients home medicines and have made adjustments as needed   Social Determinants of Health:  Has access to outpatient care        Final Clinical Impression(s) / ED Diagnoses Final diagnoses:  Chest pain, unspecified type    Rx / DC Orders ED Discharge Orders     None         Godfrey Pick, MD 05/27/22 1651

## 2022-05-27 NOTE — Assessment & Plan Note (Signed)
Controlled, A1c 6.1. - SSI- S -Hold home metformin

## 2022-05-27 NOTE — Assessment & Plan Note (Signed)
Systolic down to 65L with nitro has improved currently 120s. -Hold Lasix, lisinopril, tamsulosin for now

## 2022-05-27 NOTE — ED Notes (Signed)
Xray at BS 

## 2022-05-27 NOTE — Assessment & Plan Note (Addendum)
History of coronary artery disease.  Chest pain typical and atypical features.  Cardiac cath 2006 with borderline OM disease, moderate RCA disease.  Nuclear stress test 05/16/2021 no ischemia.  Troponin- 4 x 2, EKG so far unremarkable.  Reports compliance with aspirin and Plavix. -Etiology consulted in ED-Limited echocardiogram, n.p.o. at midnight, less EKG changes or positive troponin would not start heparin, as needed morphine for chest pain. -Check D-dimer, WNL , 0.33.

## 2022-05-27 NOTE — Consult Note (Signed)
Cardiology Consultation:   Patient ID: Christopher Randolph MRN: [id number]; DOB: Oct 04, 1946  Admit date: 05/27/2022 Date of Consult: 05/27/2022  PCP:  Curlene Labrum, MD   [facility] Providers Cardiologist:  Rozann Lesches, MD        Patient Profile:   Christopher Randolph is a 76 y.o. male with a hx of 76 yo male history of HFpEF, CAD with moderate RCA disease by cath 2006, prior TIA with carotid stenosis s/p right transcarotid revasc 02/2022 on plavix, HTN, HL, DM2 who is being seen 05/27/2022 for the evaluation of chest pain at the request of Dr Tomi Bamberger.  History of Present Illness:   Mr. Christopher Randolph 76 yo male history of HFpEF, CAD with moderate RCA disease by cath 2006, prior TIA with carotid stenosis s/p right transcarotid revasc 02/2022 on plavix, HTN, HL, DM2 presents with chest pain.   He reports 2-3 days of sharp pain mid to left chest. Can occur at rest or with activity. Denies any other associated symptoms other than some associated fatigue. Symptoms have progressed in severity over the last few days. Today onset around 9 to 10 AM, constant pain x 6-7 hours though some variability in severity but has not gone away over that time frame. Somewhat better with NG in the ER but bp dropped    K 3.5 BUN 18 Cr 0.95 WBC 4.8 Hgb 14.3 Plt 153 BNP 43 Mg 1.7 Trop 4--> CXR no acute process EKG SR, no acute ischemic changes  02/2022 echo: LVEF 60-65%, no WMAs, grade II dd  04/2021 nuclear stress [facility] (in care everyhere): No ishcemia, potential scar basal inferior wall   Past Medical History:  Diagnosis Date   Arthritis    Brain TIA 02/26/2022   CAD (coronary artery disease)    [place] Grout vessel and moderate RCA disease 2006   Cancer ([place]) 1990   Melanoma Lower right Leg   Carotid artery disease ([place])    Complication of anesthesia 2020   blood presure dropped when  started to put payient , blood preesure.   COPD (chronic obstructive pulmonary disease) (HCC)    Essential hypertension     Glaucoma    Hyperlipidemia    Hypothyroidism    Lyme disease    PAD (peripheral artery disease) (H[facility])    Left common iliac stent 2004   Pneumonia    TIA (transient ischemic attack) 02/2022   Type 2 diabetes mellitus ([place])    Wears dentures    Wears glasses     Past Surgical History:  Procedure Laterality Date   CATARACT EXTRACTION Bilateral    CATARACT EXTRACTION, BILATERAL     CERVICAL DISC SURGERY     x 1   COLONOSCOPY N/A 11/06/2016   Procedure: COLONOSCOPY;  Surgeon: Daneil Dolin, MD;  [facility];  Service: Endoscopy;  Laterality: N/A;  7:30 AM   COLONOSCOPY  2012   Dr. Anthony Sar: normal. reviewed reports, which states he has a history of polyps in remote past.    ELBOW SURGERY Left    EYE SURGERY Bilateral    Cat Sx   EYE SURGERY Left 01/14/2021   [facility] - Dr. Jovita Kussmaul   IRIDOTOMY / IRIDECTOMY Left 01/14/2021   [place] - Dr. Jovita Kussmaul   JOINT REPLACEMENT     LUMBAR WOUND DEBRIDEMENT N/A 01/24/2019   Procedure: LUMBAR WOUND Exploration for Evacation of Seroma vs. Hematoma;  Surgeon: Jovita Gamma, MD;  Location: [facility];  Service: Neurosurgery;  Laterality: N/A;  MELANOMA EXCISION     right leg, seen at [facility] and underwent immunotherapy   NECK SURGERY      x 1 yrs ago   PARS PLANA VITRECTOMY Left 04/04/2020   Procedure: PARS PLANA VITRECTOMY WITH 25G REMOVAL/SUTURE INTRAOCULAR LENS;  Surgeon: [name], MD;  Location: [place];  Service: Ophthalmology;  Laterality: Left;   REPLACEMENT TOTAL KNEE Right    TRANSCAROTID ARTERY REVASCULARIZATION  Right 03/25/2022   Procedure: Right Transcarotid Artery Revascularization;  Surgeon: Serafina Mitchell, MD;  Location: [facility] OR;  Service: Vascular;  Laterality: Right;   WISDOM TOOTH EXTRACTION       Inpatient Medications: Scheduled Meds:  Continuous Infusions:  PRN Meds:   Allergies:    Allergies  Allergen Reactions   Erythromycin Swelling    swelling of lips   Shellfish Allergy Rash and Other (See  Comments)    Gout flares   Azithromycin Swelling    Social History:   Social History   Socioeconomic History   Marital status: Married    Spouse name: Detta   Number of children: Not on file   Years of education: Not on file   Highest education level: Not on file  Occupational History   Not on file  Tobacco Use   Smoking status: Former    Packs/day: 1.50    Years: 40.00    Pack years: 60.00    Types: Cigarettes    Start date: 11/04/1963    Quit date: 10/29/2003    Years since quitting: 18.5    Passive exposure: Never   Smokeless tobacco: Never  Vaping Use   Vaping Use: Never used  Substance and Sexual Activity   Alcohol use: Yes    Alcohol/week: 15.0 standard drinks    Types: 14 Cans of beer, 1 Standard drinks or equivalent per week    Comment: 2 beers or a mixed drink   Drug use: No   Sexual activity: Not on file  Other Topics Concern   Not on file  Social History Narrative   Lives with wife   Social Determinants of Health   Financial Resource Strain: Not on file  Food Insecurity: Not on file  Transportation Needs: Not on file  Physical Activity: Not on file  Stress: Not on file  Social Connections: Not on file  Intimate Partner Violence: Not on file    Family History:    Family History  Problem Relation Age of Onset   Heart disease Mother    Heart attack Mother    Colon polyps Mother    Colon polyps Brother    Heart attack Maternal Grandmother    Heart attack Maternal Grandfather    Colon cancer Neg Hx      ROS:  Please see the history of present illness.   All other ROS reviewed and negative.     Physical Exam/Data:   Vitals:   05/27/22 1500 05/27/22 1510 05/27/22 1515 05/27/22 1520  BP:   (!) 109/56   Pulse: (!) 59 (!) 59  (!) 59  Resp: '18 16  16  '$ SpO2: 92% 92%  92%  Weight:      Height:       No intake or output data in the 24 hours ending 05/27/22 1543    05/27/2022    1:39 PM 05/06/2022   11:12 AM 05/05/2022   12:50 PM  Last 3  Weights  Weight (lbs) 167 lb 5.3 oz 167 lb 6.4 oz 167 lb  Weight (kg) 75.9  kg 75.932 kg 75.751 kg     Body mass index is 25.44 kg/m.  General:  Well nourished, well developed, in no acute distress HEENT: normal Neck: no JVD Vascular: No carotid bruits; Distal pulses 2+ bilaterally Cardiac:  normal S1, S2; RRR; no murmur  Lungs:  clear to auscultation bilaterally, no wheezing, rhonchi or rales  Abd: soft, nontender, no hepatomegaly  Ext: no edema Musculoskeletal:  left chest wall mildly tender to palpation Skin: warm and dry  Neuro:  CNs 2-12 intact, no focal abnormalities noted Psych:  Normal affect     Laboratory Data:  High Sensitivity Troponin:   Recent Labs  Lab 05/27/22 1352  TROPONINIHS 4     Chemistry Recent Labs  Lab 05/27/22 1352  NA 138  K 3.5  CL 105  CO2 26  GLUCOSE 117*  BUN 18  CREATININE 0.95  CALCIUM 9.2  MG 1.7  GFRNONAA >60  ANIONGAP 7    No results for input(s): PROT, ALBUMIN, AST, ALT, ALKPHOS, BILITOT in the last 168 hours. Lipids No results for input(s): CHOL, TRIG, HDL, LABVLDL, LDLCALC, CHOLHDL in the last 168 hours.  Hematology Recent Labs  Lab 05/27/22 1352  WBC 4.8  RBC 4.73  HGB 14.3  HCT 42.2  MCV 89.2  MCH 30.2  MCHC 33.9  RDW 14.2  PLT 153   Thyroid No results for input(s): TSH, FREET4 in the last 168 hours.  BNPNo results for input(s): BNP, PROBNP in the last 168 hours.  DDimer No results for input(s): DDIMER in the last 168 hours.   Radiology/Studies:  DG Chest Port 1 View  Result Date: 05/27/2022 CLINICAL DATA:  Left-sided chest pain EXAM: PORTABLE CHEST 1 VIEW COMPARISON:  03/30/2022 FINDINGS: No new consolidation. Mild right basilar atelectasis/scarring. No significant pleural effusion. No pneumothorax. Normal heart size. IMPRESSION: Mild right basilar atelectasis/scarring. Electronically Signed   By: Macy Mis M.D.   On: 05/27/2022 14:05     Assessment and Plan:   1.Chest pain -cath 2006 borderline OM  disease, , mod RCA disease - 04/2021 nuclear stress [facility] no ischemia - some atypical features in that pain constant approx 6-7 hours though variable in severity, some tenderness left chest wall with palpation - initial EKG and troponin benign - would cycle ekg, enzymes. Limited echo tomorrow AM reassess LV function. Would make npo at midnight in case ischemic testing becomes indicated. Unless EKG changes or positive trop would not start heparin - avoid additional NG given drop in bp in ER, would use prn morphine. Wrote for additioanl '2mg'$  to be given.    2. Chronic HFpEF - he is euvolemic today, continue home regimen  3. History of carotid stenosis - he is on plavix for prior carotid intervention, not for cardiac indication.     For questions or updates, please contact [place] Please consult [email or web address] for contact info under    Signed, Carlyle Dolly, MD  05/27/2022 3:43 PM

## 2022-05-27 NOTE — ED Notes (Signed)
EDP into room, at BS.  ?

## 2022-05-27 NOTE — ED Triage Notes (Signed)
Pt c/o left side chest pain for the last few days with back pain; pt also c/o some left face numbness;  Pt had cardiac stents placed in April 2023  Pt c/o some dizziness and lightheadedness

## 2022-05-27 NOTE — H&P (Addendum)
History and Physical    Christopher Randolph XBM:[id number] DOB: 08-May-1946 DOA: 05/27/2022  PCP: Curlene Labrum, MD   Patient coming from: Home  I have personally briefly reviewed patient's old medical records in [place]  Chief Complaint: Chest pain  HPI: Christopher Randolph is a 76 y.o. male with medical history significant for diastolic CHF, peripheral carotid and coronary artery disease, hypertension diabetes mellitus and COPD.   Patient presented to the ED with complaints of intermittent chest pain over the past 2 to 3 days.  Chest pain occur at rest and with exertion, and is associated with dizziness when standing.  Reports gradual worsening of chest pain, and today chest pain was worse lasting about 6 to 7 hours somewhat improved with nitro given in ED.  Chest pain No associated difficulty breathing no nausea no vomiting.  He describes the chest pain as " intense", and radiates to the back.  He has chronic back pain, with surgery and rods placed but reports over the past week and a half his back pain has been worse. No lower extremity swelling.  No history of blood clots in legs or lungs.  ED Course: Temperature 98.1.  Heart rate 59-73.  Respirate rate 14-21.  Blood pressure initially 137/75, dropped to 70/51 after nitro given.  O2 sats greater than 92% on room air. Troponin 4, BNP 43.  Chest x-ray without acute abnormality.  EKG unchanged from prior. Was consulted, recommended admission, trend troponin, heparin only if EKG changes and positive troponin, morphine for pain.  Review of Systems: As per HPI all other systems reviewed and negative.  Past Medical History:  Diagnosis Date   Arthritis    Brain TIA 02/26/2022   CAD (coronary artery disease)    Branch vessel and moderate RCA disease 2006   Cancer ([place]) 1990   Melanoma Lower right Leg   Carotid artery disease ([place])    Complication of anesthesia 2020   blood presure dropped when  started to put payient , blood  preesure.   COPD (chronic obstructive pulmonary disease) (HCC)    Essential hypertension    Glaucoma    Hyperlipidemia    Hypothyroidism    Lyme disease    PAD (peripheral artery disease) (HCC)    Left common iliac stent 2004   Pneumonia    TIA (transient ischemic attack) 02/2022   Type 2 diabetes mellitus ([place])    Wears dentures    Wears glasses     Past Surgical History:  Procedure Laterality Date   CATARACT EXTRACTION Bilateral    CATARACT EXTRACTION, BILATERAL     CERVICAL DISC SURGERY     x 1   COLONOSCOPY N/A 11/06/2016   Procedure: COLONOSCOPY;  Surgeon: Daneil Dolin, MD;  [facility];  Service: Endoscopy;  Laterality: N/A;  7:30 AM   COLONOSCOPY  2012   Dr. Anthony Sar: normal. reviewed reports, which states he has a history of polyps in remote past.    ELBOW SURGERY Left    EYE SURGERY Bilateral    Cat Sx   EYE SURGERY Left 01/14/2021   [facility] - Dr. Jovita Kussmaul   IRIDOTOMY / IRIDECTOMY Left 01/14/2021   [place] - Dr. Jovita Kussmaul   JOINT REPLACEMENT     LUMBAR WOUND DEBRIDEMENT N/A 01/24/2019   Procedure: LUMBAR WOUND Exploration for Evacation of Seroma vs. Hematoma;  Surgeon: Jovita Gamma, MD;  Location: [facility];  Service: Neurosurgery;  Laterality: N/A;   MELANOMA EXCISION  right leg, seen at [facility] and underwent immunotherapy   NECK SURGERY      x 1 yrs ago   PARS PLANA VITRECTOMY Left 04/04/2020   Procedure: PARS PLANA VITRECTOMY WITH 25G REMOVAL/SUTURE INTRAOCULAR LENS;  Surgeon: [name], MD;  Location: [facility];  Service: Ophthalmology;  Laterality: Left;   REPLACEMENT TOTAL KNEE Right    TRANSCAROTID ARTERY REVASCULARIZATION  Right 03/25/2022   Procedure: Right Transcarotid Artery Revascularization;  Surgeon: Serafina Mitchell, MD;  Location: [facility] OR;  Service: Vascular;  Laterality: Right;   WISDOM TOOTH EXTRACTION       reports that he quit smoking about 18 years ago. His smoking use included cigarettes. He started smoking about 58 years ago.  He has a 60.00 pack-year smoking history. He has never been exposed to tobacco smoke. He has never used smokeless tobacco. He reports current alcohol use of about 15.0 standard drinks per week. He reports that he does not use drugs.  Allergies  Allergen Reactions   Erythromycin Swelling    swelling of lips   Shellfish Allergy Rash and Other (See Comments)    Gout flares   Azithromycin Swelling    Family History  Problem Relation Age of Onset   Heart disease Mother    Heart attack Mother    Colon polyps Mother    Colon polyps Brother    Heart attack Maternal Grandmother    Heart attack Maternal Grandfather    Colon cancer Neg Hx     Prior to Admission medications   Medication Sig Start Date End Date Taking? Authorizing Provider  aspirin EC 81 MG tablet Take 81 mg by mouth daily. Swallow whole.    [provider]  atorvastatin (LIPITOR) 20 MG tablet TAKE 1 TABLET BY MOUTH DAILY Patient taking differently: Take 20 mg by mouth daily. 12/03/21   Satira Sark, MD  Budeson-Glycopyrrol-Formoterol (BREZTRI AEROSPHERE) 160-9-4.8 MCG/ACT AERO Inhale 2 puffs into the lungs in the morning and at bedtime. 05/06/22   Rigoberto Noel, MD  clopidogrel (PLAVIX) 75 MG tablet Take 1 tablet (75 mg total) by mouth daily. 02/28/22 02/28/23  Kathie Dike, MD  cyclobenzaprine (FLEXERIL) 10 MG tablet Take 10 mg by mouth every 8 (eight) hours as needed for muscle spasms. 09/24/20   [provider]  cycloSPORINE (RESTASIS) 0.05 % ophthalmic emulsion Place 1 drop into both eyes daily.    [provider]  fluticasone (FLONASE) 50 MCG/ACT nasal spray Place 2 sprays into both nostrils daily.    [provider]  furosemide (LASIX) 40 MG tablet Take 1 tablet (40 mg total) by mouth daily. 04/04/22   Orson Eva, MD  latanoprost (XALATAN) 0.005 % ophthalmic solution Place 1 drop into the left eye at bedtime. 02/11/22   [provider]  levothyroxine (SYNTHROID, LEVOTHROID) 150  MCG tablet Take 150 mcg by mouth See admin instructions. Alternates taking 150 mg daily for 2 days then 175 mcg the third day then repeat 11/01/18   [provider]  levothyroxine (SYNTHROID, LEVOTHROID) 175 MCG tablet Take 175 mcg by mouth See admin instructions. Alternates taking 150 mg daily for 2 days then 175 mcg the third day then repeat    [provider]  lisinopril (ZESTRIL) 2.5 MG tablet Take 1 tablet (2.5 mg total) by mouth daily. 04/04/22   Orson Eva, MD  metFORMIN (GLUCOPHAGE) 500 MG tablet Take 500 mg by mouth daily. 10/19/14   [provider]  mometasone (ELOCON) 0.1 % ointment Apply 1 application.  topically at bedtime.    [provider]  nitroGLYCERIN (NITROSTAT) 0.4 MG SL tablet DISSOLVE 1 TABLET UNDER THE TONGUE EVERY 5 MINUTES AS NEEDED FOR CHEST PAIN. DO NOT EXCEED A TOTAL OF 3 DOSES IN 15 MINUTES. Patient taking differently: Place 0.4 mg under the tongue every 5 (five) minutes as needed for chest pain. 04/18/21   Satira Sark, MD  prednisoLONE acetate (PRED FORTE) 1 % ophthalmic suspension Place 1 drop into the left eye daily. 02/11/22   [provider]  tamsulosin (FLOMAX) 0.4 MG CAPS capsule Take 0.8 mg by mouth at bedtime.    [provider]    Physical Exam: Vitals:   05/27/22 1515 05/27/22 1520 05/27/22 1530 05/27/22 1600  BP: (!) 109/56     Pulse:  (!) 59 81 (!) 54  Resp:  '16 17 14  '$ SpO2:  92% 94% 96%  Weight:      Height:        Constitutional: NAD, calm, comfortable Vitals:   05/27/22 1515 05/27/22 1520 05/27/22 1530 05/27/22 1600  BP: (!) 109/56     Pulse:  (!) 59 81 (!) 54  Resp:  '16 17 14  '$ SpO2:  92% 94% 96%  Weight:      Height:       Eyes: PERRL, lids and conjunctivae normal ENMT: Mucous membranes are moist.   Neck: normal, supple, no masses, no thyromegaly Respiratory: clear to auscultation bilaterally, no wheezing, no crackles.  Cardiovascular: Regular rate and rhythm, 2/6 systolic murmur  , no rubs or gallops Abdomen: no tenderness, no masses palpated. No hepatosplenomegaly. Bowel sounds positive.  Musculoskeletal: no clubbing / cyanosis. No joint deformity upper and lower extremities.  Skin: no rashes, lesions, ulcers. No induration Neurologic: No apparent cranial nerve abnormality moving extremities spontaneously Psychiatric: Normal judgment and insight. Alert and oriented x 3. Normal mood.   Labs on Admission: I have personally reviewed following labs and imaging studies  CBC: Recent Labs  Lab 05/27/22 1352  WBC 4.8  HGB 14.3  HCT 42.2  MCV 89.2  PLT 681   Basic Metabolic Panel: Recent Labs  Lab 05/27/22 1352  NA 138  K 3.5  CL 105  CO2 26  GLUCOSE 117*  BUN 18  CREATININE 0.95  CALCIUM 9.2  MG 1.7   Radiological Exams on Admission: DG Chest Port 1 View  Result Date: 05/27/2022 CLINICAL DATA:  Left-sided chest pain EXAM: PORTABLE CHEST 1 VIEW COMPARISON:  03/30/2022 FINDINGS: No new consolidation. Mild right basilar atelectasis/scarring. No significant pleural effusion. No pneumothorax. Normal heart size. IMPRESSION: Mild right basilar atelectasis/scarring. Electronically Signed   By: Macy Mis M.D.   On: 05/27/2022 14:05    EKG: Independently reviewed.  Rate 72, QTc 409.  No significant change from prior.  Assessment/Plan Principal Problem:   Chest pain Active Problems:   CAD (coronary artery disease)   Hypertension   Hypothyroidism   Systolic murmur   DMII (diabetes mellitus, type 2) (HCC)   Congestive heart failure (CHF) (HCC)   Assessment and Plan: * Chest pain History of coronary artery disease.  Chest pain typical and atypical features.  Cardiac cath 2006 with borderline OM disease, moderate RCA disease.  Nuclear stress test 05/16/2021 no ischemia.  Troponin- 4 x 2, EKG so far unremarkable.  Reports compliance with aspirin and Plavix. -Etiology consulted in ED-Limited echocardiogram, n.p.o. at midnight, less EKG changes or positive  troponin would not start heparin, as needed morphine for chest pain. -Check D-dimer, WNL ,  0.33.     Congestive heart failure (CHF) (HCC) Stable and compensated.  Blood pressure dropped in the ED with nitro.  Systolic down to 41O subsequently improved without intervention.  Echo 02/2022 EF of 60 to 65% with G2DD. -Hold Lasix, lisinopril for now -Resume aspirin, Plavix  DMII (diabetes mellitus, type 2) (HCC) Controlled, A1c 6.1. - SSI- S -Hold home metformin  Hypertension Systolic down to 87O with nitro has improved currently 120s. -Hold Lasix, lisinopril, tamsulosin for now    DVT prophylaxis: Lovenox Code Status: Full Family Communication: Spouse at bedside Disposition Plan: ~ 1-2 days Consults called: Cards Admission status: Obs tele   Author: Bethena Roys, MD 05/27/2022 6:04 PM  For on call review [email or web address].

## 2022-05-27 NOTE — ED Notes (Addendum)
2nd and 3rd ntg not given d/t drop in BP. sBP 120 down to 89 after first ntg. Pain from 7/10 to 3/10 (improved). EDP notified.

## 2022-05-27 NOTE — ED Provider Notes (Signed)
Pt seen by Dr Doren Custard.  Please see his note.  Dr Harl Bowie, cardiology has evaluated the patient in the ED.  Recommends admission to the hospitalist service here for further evaluation.  Case discussed with Dr Denton Brick regarding admission.   Dorie Rank, MD 05/27/22 [phone]

## 2022-05-28 ENCOUNTER — Observation Stay (HOSPITAL_BASED_OUTPATIENT_CLINIC_OR_DEPARTMENT_OTHER): Payer: Medicare Other

## 2022-05-28 ENCOUNTER — Encounter (HOSPITAL_COMMUNITY): Payer: Self-pay | Admitting: Internal Medicine

## 2022-05-28 DIAGNOSIS — I10 Essential (primary) hypertension: Secondary | ICD-10-CM | POA: Diagnosis not present

## 2022-05-28 DIAGNOSIS — E039 Hypothyroidism, unspecified: Secondary | ICD-10-CM

## 2022-05-28 DIAGNOSIS — I779 Disorder of arteries and arterioles, unspecified: Secondary | ICD-10-CM | POA: Diagnosis not present

## 2022-05-28 DIAGNOSIS — I5032 Chronic diastolic (congestive) heart failure: Secondary | ICD-10-CM | POA: Diagnosis not present

## 2022-05-28 DIAGNOSIS — J449 Chronic obstructive pulmonary disease, unspecified: Secondary | ICD-10-CM | POA: Diagnosis not present

## 2022-05-28 DIAGNOSIS — I2510 Atherosclerotic heart disease of native coronary artery without angina pectoris: Secondary | ICD-10-CM | POA: Diagnosis not present

## 2022-05-28 DIAGNOSIS — I509 Heart failure, unspecified: Secondary | ICD-10-CM | POA: Diagnosis not present

## 2022-05-28 DIAGNOSIS — R079 Chest pain, unspecified: Secondary | ICD-10-CM

## 2022-05-28 DIAGNOSIS — E119 Type 2 diabetes mellitus without complications: Secondary | ICD-10-CM | POA: Diagnosis not present

## 2022-05-28 DIAGNOSIS — Z7982 Long term (current) use of aspirin: Secondary | ICD-10-CM | POA: Diagnosis not present

## 2022-05-28 DIAGNOSIS — I110 Hypertensive heart disease with heart failure: Secondary | ICD-10-CM | POA: Diagnosis not present

## 2022-05-28 DIAGNOSIS — Z96651 Presence of right artificial knee joint: Secondary | ICD-10-CM | POA: Diagnosis not present

## 2022-05-28 DIAGNOSIS — Z85828 Personal history of other malignant neoplasm of skin: Secondary | ICD-10-CM | POA: Diagnosis not present

## 2022-05-28 DIAGNOSIS — E1169 Type 2 diabetes mellitus with other specified complication: Secondary | ICD-10-CM | POA: Diagnosis not present

## 2022-05-28 DIAGNOSIS — Z8673 Personal history of transient ischemic attack (TIA), and cerebral infarction without residual deficits: Secondary | ICD-10-CM | POA: Diagnosis not present

## 2022-05-28 DIAGNOSIS — Z87891 Personal history of nicotine dependence: Secondary | ICD-10-CM | POA: Diagnosis not present

## 2022-05-28 LAB — ECHOCARDIOGRAM LIMITED
Calc EF: 64.9 %
Height: 68 in
S' Lateral: 3 cm
Single Plane A2C EF: 66.8 %
Single Plane A4C EF: 61.3 %
Weight: 2677.27 oz

## 2022-05-28 LAB — NM MYOCAR MULTI W/SPECT W/WALL MOTION / EF
LV dias vol: 87 mL (ref 62–150)
LV sys vol: 31 mL
Nuc Stress EF: 64 %
Peak HR: 92 {beats}/min
RATE: 0.4
Rest HR: 51 {beats}/min
Rest Nuclear Isotope Dose: 11 mCi
SDS: 2
SRS: 3
SSS: 5
ST Depression (mm): 0.5 mm
Stress Nuclear Isotope Dose: 30 mCi
TID: 1.04

## 2022-05-28 LAB — TROPONIN I (HIGH SENSITIVITY): Troponin I (High Sensitivity): 5 ng/L (ref <18)

## 2022-05-28 MED ORDER — TECHNETIUM TC 99M TETROFOSMIN IV KIT
10.0000 | PACK | Freq: Once | INTRAVENOUS | Status: AC | PRN
  Administered 2022-05-28: 11 via INTRAVENOUS

## 2022-05-28 MED ORDER — FUROSEMIDE 20 MG PO TABS
20.0000 mg | ORAL_TABLET | Freq: Every day | ORAL | Status: DC
  Administered 2022-05-28: 20 mg via ORAL
  Filled 2022-05-28: qty 1

## 2022-05-28 MED ORDER — TECHNETIUM TC 99M TETROFOSMIN IV KIT
30.0000 | PACK | Freq: Once | INTRAVENOUS | Status: AC | PRN
Start: 2022-05-28 — End: 2022-05-28
  Administered 2022-05-28: 30 via INTRAVENOUS

## 2022-05-28 MED ORDER — REGADENOSON 0.4 MG/5ML IV SOLN
INTRAVENOUS | Status: AC
  Administered 2022-05-28: 0.4 mg via INTRAVENOUS
  Filled 2022-05-28: qty 5

## 2022-05-28 MED ORDER — OXYCODONE-ACETAMINOPHEN 5-325 MG PO TABS: 1.0000 | ORAL_TABLET | ORAL | 0 refills | Status: DC | PRN

## 2022-05-28 MED ORDER — SODIUM CHLORIDE FLUSH 0.9 % IV SOLN
INTRAVENOUS | Status: AC
  Administered 2022-05-28: 10 mL via INTRAVENOUS
  Filled 2022-05-28: qty 10

## 2022-05-28 MED ORDER — FUROSEMIDE 20 MG PO TABS: 20.0000 mg | ORAL_TABLET | Freq: Every day | ORAL | 0 refills | Status: DC

## 2022-05-28 NOTE — TOC Progression Note (Signed)
  Transition of Care [facility]) Screening Note   Patient Details  Name: Christopher Randolph Date of Birth: 03-07-1946   Transition of Care [facility]) CM/SW Contact:    Shade Flood, LCSW Phone Number: 05/28/2022, 10:43 AM    Transition of Care Department [facility]) has reviewed patient and no TOC needs have been identified at this time. We will continue to monitor patient advancement through interdisciplinary progression rounds. If new patient transition needs arise, please place a TOC consult.

## 2022-05-28 NOTE — Discharge Summary (Signed)
Christopher Randolph, is a 76 y.o. male  DOB 01/27/1946  MRN [id number].  Admission date:  05/27/2022  Admitting Physician  Bethena Roys, MD  Discharge Date:  05/28/2022   Primary MD  Curlene Labrum, MD  Recommendations for primary care physician for things to follow:   Please follow up with PCP or cardiology in 1-2 weeks to check weight and bmp (for potassium), and to adjust Lasix dose   -1)follow up Dr. Sherley Bounds neurosurgeon [facility] for further evaluation of your back pain 2) follow-up with your vascular surgeon if you have any further concerns about the iliac stents 3)Avoid ibuprofen/Advil/Aleve/Motrin/Goody Powders/Naproxen/BC powders/Meloxicam/Diclofenac/Indomethacin and other Nonsteroidal anti-inflammatory medications as these will make you more likely to bleed and can cause stomach ulcers, can also cause Kidney problems.   Admission Diagnosis  Chest pain [R07.9] Chest pain, unspecified type [R07.9]   Discharge Diagnosis  Chest pain [R07.9] Chest pain, unspecified type [R07.9]    Principal Problem:   Chest pain Active Problems:   CAD (coronary artery disease)   Hypertension   Hypothyroidism   Carotid artery disease (HCC)   Systolic murmur   DMII (diabetes mellitus, type 2) (HCC)   Congestive heart failure (CHF) (HCC)      Past Medical History:  Diagnosis Date   Arthritis    Brain TIA 02/26/2022   CAD (coronary artery disease)    Branch vessel and moderate RCA disease 2006   Cancer ([place]) 1990   Melanoma Lower right Leg   Carotid artery disease ([place])    Complication of anesthesia 2020   blood presure dropped when  started to put payient , blood preesure.   COPD (chronic obstructive pulmonary disease) (HCC)    Essential hypertension    Glaucoma    Hyperlipidemia    Hypothyroidism    Lyme disease    PAD (peripheral artery disease) (HCC)    Left common iliac stent 2004    Pneumonia    TIA (transient ischemic attack) 02/2022   Type 2 diabetes mellitus ([place])    Wears dentures    Wears glasses     Past Surgical History:  Procedure Laterality Date   CATARACT EXTRACTION Bilateral    CATARACT EXTRACTION, BILATERAL     CERVICAL DISC SURGERY     x 1   COLONOSCOPY N/A 11/06/2016   Procedure: COLONOSCOPY;  Surgeon: Daneil Dolin, MD;  [facility];  Service: Endoscopy;  Laterality: N/A;  7:30 AM   COLONOSCOPY  2012   Dr. Anthony Sar: normal. reviewed reports, which states he has a history of polyps in remote past.    ELBOW SURGERY Left    EYE SURGERY Bilateral    Cat Sx   EYE SURGERY Left 01/14/2021   [facility] - Dr. Jovita Kussmaul   IRIDOTOMY / IRIDECTOMY Left 01/14/2021   [place] - Dr. Jovita Kussmaul   JOINT REPLACEMENT     LUMBAR WOUND DEBRIDEMENT N/A 01/24/2019   Procedure: LUMBAR WOUND Exploration for Evacation of Seroma vs. Hematoma;  Surgeon: Jovita Gamma,  MD;  Location: [facility];  Service: Neurosurgery;  Laterality: N/A;   MELANOMA EXCISION     right leg, seen at [facility] and underwent immunotherapy   NECK SURGERY      x 1 yrs ago   PARS PLANA VITRECTOMY Left 04/04/2020   Procedure: PARS PLANA VITRECTOMY WITH 25G REMOVAL/SUTURE INTRAOCULAR LENS;  Surgeon: [name], MD;  Location: [facility];  Service: Ophthalmology;  Laterality: Left;   REPLACEMENT TOTAL KNEE Right    TRANSCAROTID ARTERY REVASCULARIZATION  Right 03/25/2022   Procedure: Right Transcarotid Artery Revascularization;  Surgeon: Serafina Mitchell, MD;  Location: [facility] OR;  Service: Vascular;  Laterality: Right;   WISDOM TOOTH EXTRACTION         HPI  from the history and physical done on the day of admission:   Chief Complaint: Chest pain   HPI: Christopher Randolph is a 76 y.o. male with medical history significant for diastolic CHF, peripheral carotid and coronary artery disease, hypertension diabetes mellitus and COPD.   Patient presented to the ED with complaints of intermittent chest pain  over the past 2 to 3 days.  Chest pain occur at rest and with exertion, and is associated with dizziness when standing.  Reports gradual worsening of chest pain, and today chest pain was worse lasting about 6 to 7 hours somewhat improved with nitro given in ED.  Chest pain No associated difficulty breathing no nausea no vomiting.  He describes the chest pain as " intense", and radiates to the back.  He has chronic back pain, with surgery and rods placed but reports over the past week and a half his back pain has been worse. No lower extremity swelling.  No history of blood clots in legs or lungs.   ED Course: Temperature 98.1.  Heart rate 59-73.  Respirate rate 14-21.  Blood pressure initially 137/75, dropped to 70/51 after nitro given.  O2 sats greater than 92% on room air. Troponin 4, BNP 43.  Chest x-ray without acute abnormality.  EKG unchanged from prior. Was consulted, recommended admission, trend troponin, heparin only if EKG changes and positive troponin, morphine for pain.   Review of Systems: As per HPI all other systems reviewed and negative.   Hospital Course:    Assessment and Plan: 1)Atypical chest pain History of coronary artery disease.  Cardiac cath 2006 with borderline OM disease, moderate RCA disease.  Nuclear stress test 05/16/2021 no ischemia.   -Troponin- 4 x 2, EKG  unremarkable.  Reports compliance with aspirin and Plavix. - D-dimer, WNL - nuclear stress test without reversible ischemia, EF on gated images 64% -Limited echo with EF of 60 to 65% no regional wall motion abnormalities, mild LVH noted -Patient ruled out for ACS by cardiac enzymes and EKG, negative stress test and reassuring echo as above -Cardiology consult appreciated  -cardiology recommends no further work-up at this time -Continue aspirin, Lipitor and Plavix  2)History of chronic diastolic dysfunction CHF congestive heart failure/HFpEF Stable and compensated.   -BNP WNL -Echo and stress test as above  #1 Lasix and lisinopril adjusted downward as below in #4  3)DMII (diabetes mellitus, type 2) (HCC) A1c 6.1 reflecting excellent diabetic control PTA -Continue metformin  4)Hypertension Due to concerns about soft BP decrease lisinopril to 2.5 mg daily and Lasix to 20 mg daily  Discharge Condition: Stable,  Follow UP   Follow-up Information     Curlene Labrum, MD Follow up in 1 week(s).   Specialty: Family Medicine Why: f/u with  PCP in 1-2 weeks to check weight, and increase Lasix dose if needed and to check bmp to follow up on potassium Contact information: [address] [place] [address] [phone]         Satira Sark, MD Follow up in 1 week(s).   Specialty: Cardiology Why: f/u with cardiology in 1-2 weeks to follow up on chest pain and to check weight and increase lasix dose if needed and follow up on potassium level Contact information: [place] [address] [phone]                  Consults obtained -cardiology  Diet and Activity recommendation:  As advised  Discharge Instructions    Discharge Instructions     Call MD for:  difficulty breathing, headache or visual disturbances   Complete by: As directed    Call MD for:  persistant dizziness or light-headedness   Complete by: As directed    Call MD for:  persistant nausea and vomiting   Complete by: As directed    Call MD for:  severe uncontrolled pain   Complete by: As directed    Call MD for:  temperature >100.4   Complete by: As directed    Diet - low sodium heart healthy   Complete by: As directed    Diet Carb Modified   Complete by: As directed    Discharge instructions   Complete by: As directed    1)follow up Dr. Sherley Bounds neurosurgeon [facility] for further evaluation of your back pain 2) follow-up with your vascular surgeon if you have any further concerns about the iliac stents 3)Avoid ibuprofen/Advil/Aleve/Motrin/Goody Powders/Naproxen/BC  powders/Meloxicam/Diclofenac/Indomethacin and other Nonsteroidal anti-inflammatory medications as these will make you more likely to bleed and can cause stomach ulcers, can also cause Kidney problems.   Increase activity slowly   Complete by: As directed          Discharge Medications     Allergies as of 05/28/2022       Reactions   Erythromycin Swelling   swelling of lips   Shellfish Allergy Rash, Other (See Comments)   Gout flares   Azithromycin Swelling   Nitrofuran Derivatives         Medication List     TAKE these medications    acetaminophen 325 MG tablet Commonly known as: TYLENOL Take 650 mg by mouth every 6 (six) hours as needed.   aspirin EC 81 MG tablet Take 81 mg by mouth daily. Swallow whole.   atorvastatin 20 MG tablet Commonly known as: LIPITOR TAKE 1 TABLET BY MOUTH DAILY   Breztri Aerosphere 160-9-4.8 MCG/ACT Aero Generic drug: Budeson-Glycopyrrol-Formoterol Inhale 2 puffs into the lungs in the morning and at bedtime.   clopidogrel 75 MG tablet Commonly known as: Plavix Take 1 tablet (75 mg total) by mouth daily.   cycloSPORINE 0.05 % ophthalmic emulsion Commonly known as: RESTASIS Place 1 drop into both eyes daily.   fluticasone 50 MCG/ACT nasal spray Commonly known as: FLONASE Place 2 sprays into both nostrils daily.   furosemide 20 MG tablet Commonly known as: LASIX Take 1 tablet (20 mg total) by mouth daily. Start taking on: May 29, 2022 What changed:  medication strength how much to take   latanoprost 0.005 % ophthalmic solution Commonly known as: XALATAN Place 1 drop into the left eye at bedtime.   levothyroxine 150 MCG tablet Commonly known as: SYNTHROID Take 150 mcg by mouth See admin instructions.  Alternates taking 150 mg daily for 2 days then 175 mcg the third day then repeat   lisinopril 2.5 MG tablet Commonly known as: ZESTRIL Take 1 tablet (2.5 mg total) by mouth daily. What changed: how much to take   metFORMIN  500 MG tablet Commonly known as: GLUCOPHAGE Take 500 mg by mouth daily.   mometasone 0.1 % ointment Commonly known as: ELOCON Apply 1 application. topically at bedtime.   nitroGLYCERIN 0.4 MG SL tablet Commonly known as: NITROSTAT DISSOLVE 1 TABLET UNDER THE TONGUE EVERY 5 MINUTES AS NEEDED FOR CHEST PAIN. DO NOT EXCEED A TOTAL OF 3 DOSES IN 15 MINUTES. What changed: See the new instructions.   oxyCODONE-acetaminophen 5-325 MG tablet Commonly known as: Percocet Take 1 tablet by mouth every 4 (four) hours as needed for severe pain.   prednisoLONE acetate 1 % ophthalmic suspension Commonly known as: PRED FORTE Place 1 drop into the left eye daily.   tamsulosin 0.4 MG Caps capsule Commonly known as: FLOMAX Take 0.8 mg by mouth at bedtime.        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 There is no current ischemia. The study is low risk.   0.5 mm of ST depression (II, III and aVF) was noted. Lateral precordial T wave inversion.   LV perfusion is abnormal.   Left ventricular function is normal. Nuclear stress EF: 64 %. The left ventricular ejection fraction is normal (55-65%). End diastolic cavity size is normal.   DG Chest Port 1 View  Result Date: 05/27/2022 CLINICAL DATA:  Left-sided chest pain EXAM: PORTABLE CHEST 1 VIEW COMPARISON:  03/30/2022 FINDINGS: No new consolidation. Mild right basilar atelectasis/scarring. No significant pleural effusion. No pneumothorax. Normal heart size. IMPRESSION: Mild right basilar atelectasis/scarring. Electronically Signed   By: Macy Mis M.D.   On: 05/27/2022 14:05   VAS US CAROTID  Result Date: 05/04/2022 Carotid Arterial Duplex Study Patient Name:  Christopher Randolph  Date of Exam:   05/04/2022 Medical Rec #: [id number]          Accession #:    [id number] Date of Birth: [birth year]         Patient Gender: M Patient Age:   63 years Exam Location:  [facility] Procedure:      VAS US CAROTID Referring Phys: Harold Barban --------------------------------------------------------------------------------  Indications:       Carotid artery disease and Right TCAR post-op evaluation.                    Surgery date 03/25/2022 Risk Factors:      Hypertension, hyperlipidemia, past history of smoking,                    coronary artery disease, PAD. Comparison Study:  10/30/2021                    R=60-79, L=40-59 Performing Technologist: Ronal Fear RVS, RCS  Examination Guidelines: A complete evaluation includes B-mode imaging, spectral Doppler, color Doppler, and power Doppler as needed of all accessible portions of each vessel. Bilateral testing is considered an integral part of a complete examination. Limited examinations for reoccurring indications may be performed as noted.  Right Carotid Findings: +----------+--------+--------+--------+------------------+--------+  PSV cm/sEDV cm/sStenosisPlaque DescriptionComments +----------+--------+--------+--------+------------------+--------+ CCA Prox  104     14                                         +----------+--------+--------+--------+------------------+--------+ CCA Mid   53      13                                         +----------+--------+--------+--------+------------------+--------+ CCA Distal49      17                                stent    +----------+--------+--------+--------+------------------+--------+ ICA Prox  87      36      1-39%   heterogenous      stent    +----------+--------+--------+--------+------------------+--------+ ICA Mid   110     38                                stent    +----------+--------+--------+--------+------------------+--------+ ICA Distal110     39                                          +----------+--------+--------+--------+------------------+--------+ ECA       90      9                                          +----------+--------+--------+--------+------------------+--------+ +----------+--------+-------+----------------+-------------------+           PSV cm/sEDV cmsDescribe        Arm Pressure (mmHG) +----------+--------+-------+----------------+-------------------+ Subclavian89             Multiphasic, WNL                    +----------+--------+-------+----------------+-------------------+ +---------+--------+--+--------+--+---------+ VertebralPSV cm/s39EDV cm/s11Antegrade +---------+--------+--+--------+--+---------+  Right Stent(s): +---------------+---+--++++ Prox to Stent  53 13 +---------------+---+--++++ Proximal Stent 49 17 +---------------+---+--++++ Mid Stent      87 36 +---------------+---+--++++ Distal Stent   88 28 +---------------+---+--++++ Distal to [id number] +---------------+---+--++++   Left Carotid Findings: +----------+--------+--------+--------+-------------------------+--------+           PSV cm/sEDV cm/sStenosisPlaque Description       Comments +----------+--------+--------+--------+-------------------------+--------+ CCA Prox  109     20                                                +----------+--------+--------+--------+-------------------------+--------+ CCA Mid   79      18              smooth                            +----------+--------+--------+--------+-------------------------+--------+ CCA Distal82      23              smooth                            +----------+--------+--------+--------+-------------------------+--------+  ICA Prox  143     51      40-59%  calcific                          +----------+--------+--------+--------+-------------------------+--------+ ICA Mid   136     39              calcific and heterogenous          +----------+--------+--------+--------+-------------------------+--------+ ICA Distal148     48                                                +----------+--------+--------+--------+-------------------------+--------+ ECA       96      13                                                +----------+--------+--------+--------+-------------------------+--------+ +----------+--------+--------+----------------+-------------------+           PSV cm/sEDV cm/sDescribe        Arm Pressure (mmHG) +----------+--------+--------+----------------+-------------------+ [id number]             Multiphasic, WNL                    +----------+--------+--------+----------------+-------------------+ +---------+--------+--+--------+--+---------+ VertebralPSV cm/s80EDV cm/s23Antegrade +---------+--------+--+--------+--+---------+   Summary: Right Carotid: Velocities in the right ICA are consistent with a 1-39% stenosis.                Patent stent. Left Carotid: Velocities in the left ICA are consistent with a 40-59% stenosis. Vertebrals:  Bilateral vertebral arteries demonstrate antegrade flow. Subclavians: Normal flow hemodynamics were seen in bilateral subclavian              arteries. *See table(s) above for measurements and observations.  Electronically signed by Harold Barban MD on 05/04/2022 at 6:46:07 PM.    Final    ECHOCARDIOGRAM LIMITED  Result Date: 05/28/2022    ECHOCARDIOGRAM LIMITED REPORT   Patient Name:   Christopher Randolph Date of Exam: 05/28/2022 Medical Rec #:  [id number]        Height:       68.0 in Accession #:    [id number]       Weight:       167.3 lb Date of Birth:  [birth year]        BSA:          1.895 m Patient Age:    75 years         BP:           100/56 mmHg Patient Gender: M                HR:           50 bpm. Exam Location:  [facility] Procedure: Limited Echo Indications:    Chest Pain  History:        Patient has prior history of Echocardiogram examinations, most                  recent 02/28/2022. CHF, CAD, PAD, TIA and COPD,                 Signs/Symptoms:Dizziness/Lightheadedness and Chest Pain; Risk  Factors:Hypertension, Diabetes, Dyslipidemia and Former Smoker.  Sonographer:    Wenda Low Referring Phys: [place]  1. Left ventricular ejection fraction, by estimation, is 60 to 65%. The left ventricle has no regional wall motion abnormalities. There is mild left ventricular hypertrophy.  2. Right ventricular systolic function is normal. The right ventricular size is normal.  3. The aortic valve is tricuspid. There is moderate calcification of the aortic valve. There is moderate thickening of the aortic valve. FINDINGS  Left Ventricle: Left ventricular ejection fraction, by estimation, is 60 to 65%. The left ventricle has no regional wall motion abnormalities. The left ventricular internal cavity size was normal in size. There is mild left ventricular hypertrophy. Right Ventricle: The right ventricular size is normal. Right vetricular wall thickness was not well visualized. Right ventricular systolic function is normal. Aortic Valve: The aortic valve is tricuspid. There is moderate calcification of the aortic valve. There is moderate thickening of the aortic valve. There is moderate aortic valve annular calcification. Aorta: The aortic root is normal in size and structure. LEFT VENTRICLE PLAX 2D LVIDd:         4.80 cm LVIDs:         3.00 cm LV PW:         1.10 cm LV IVS:        1.10 cm LVOT diam:     2.00 cm LVOT Area:     3.14 cm  LV Volumes (MOD) LV vol d, MOD A2C: 59.1 ml LV vol d, MOD A4C: 56.6 ml LV vol s, MOD A2C: 19.6 ml LV vol s, MOD A4C: 21.9 ml LV SV MOD A2C:     39.5 ml LV SV MOD A4C:     56.6 ml LV SV MOD BP:      38.2 ml LEFT ATRIUM         Index LA diam:    4.20 cm 2.22 cm/m   AORTA Ao Root diam: 3.20 cm  SHUNTS Systemic Diam: 2.00 cm Carlyle Dolly MD Electronically signed by Carlyle Dolly MD Signature Date/Time:  05/28/2022/11:00:01 AM    Final     Micro Results   No results found for this or any previous visit (from the past 240 hour(s)).  Today   Subjective    Christopher Randolph today has no new complaints  No fever  Or chills   No Nausea, Vomiting or Diarrhea         Patient has been seen and examined prior to discharge   Objective   Blood pressure (!) 129/58, pulse 61, temperature 98.2 F (36.8 C), temperature source Oral, resp. rate 18, height '5\' 8"'$  (1.727 m), weight 75.9 kg, SpO2 95 %.   Intake/Output Summary (Last 24 hours) at 05/28/2022 1518 Last data filed at 05/28/2022 1300 Gross per 24 hour  Intake 476 ml  Output 400 ml  Net 76 ml    Exam Gen:- Awake Alert, no acute distress  HEENT:- [place].AT, No sclera icterus Neck-Supple Neck,No JVD,.  Lungs-  CTAB , good air movement bilaterally CV- S1, S2 normal, regular Abd-  +ve B.Sounds, Abd Soft, No tenderness,    Extremity/Skin:- No  edema,   good pulses Psych-affect is appropriate, oriented x3 Neuro-no new focal deficits, no tremors    Data Review   CBC w Diff:  Lab Results  Component Value Date   WBC 4.8 05/27/2022   HGB 14.3 05/27/2022   HCT 42.2 05/27/2022   PLT 153 05/27/2022   LYMPHOPCT  33 06/23/2010   MONOPCT 10 06/23/2010   EOSPCT 4 06/23/2010   BASOPCT 0 06/23/2010    CMP:  Lab Results  Component Value Date   NA 138 05/27/2022   K 3.5 05/27/2022   CL 105 05/27/2022   CO2 26 05/27/2022   BUN 18 05/27/2022   CREATININE 0.95 05/27/2022   PROT 6.9 03/31/2022   ALBUMIN 3.9 03/31/2022   BILITOT 0.9 03/31/2022   ALKPHOS 50 03/31/2022   AST 23 03/31/2022   ALT 27 03/31/2022  .  Total Discharge time is about 33 minutes  Roxan Hockey M.D on 05/28/2022 at 3:18 PM  Go to [email or web address] -  for contact info  [facility] - Office  [phone]

## 2022-05-28 NOTE — Progress Notes (Signed)
PROGRESS NOTE                                                                                                                                                                                                             Patient Demographics:    Christopher Randolph, is a 76 y.o. male, DOB - May 03, 1946, FVC:[id number]  Admit date - 05/27/2022   Admitting Physician Ejiroghene Arlyce Dice, MD  Outpatient Primary MD for the patient is Burdine, Virgina Evener, MD  LOS - 0  Outpatient Specialists: Rozann Lesches (cardiologist)  Chief Complaint  Patient presents with   Chest Pain       Brief Narrative   76 y.o. male with medical history significant for diastolic CHF, peripheral carotid and coronary artery disease, hypertension diabetes mellitus and COPD.  Patient presented to the ED with complaints of intermittent chest pain over the past 2 to 3 days.  Chest pain occur at rest and with exertion, and is associated with dizziness when standing.  Reports gradual worsening of chest pain, and today chest pain was worse lasting about 6 to 7 hours somewhat improved with nitro given in ED.  Chest pain No associated difficulty breathing no nausea no vomiting.  He describes the chest pain as " intense", and radiates to the back.  He has chronic back pain, with surgery and rods placed but reports over the past week and a half his back pain has been worse. No lower extremity swelling.  No history of blood clots in legs or lungs.   ED Course: Temperature 98.1.  Heart rate 59-73.  Respirate rate 14-21.  Blood pressure initially 137/75, dropped to 70/51 after nitro given.  O2 sats greater than 92% on room air. Troponin 4, BNP 43.  Chest x-ray without acute abnormality.  EKG unchanged from prior.  Was consulted, recommended admission, trend troponin, heparin only if EKG changes and positive troponin, morphine for pain.   Subjective:    [name] today states  that his chest pain resolved.  Pt states that has not had any discomfort since going to sleep last nite after morphine.  Pt denies fever, chills, cough, palp , sob, n/v, gerd. Pt has been seen by cardiology and will have nuclear stress test today.    Assessment  & Plan :    Principal Problem:   Chest pain  Active Problems:   CAD (coronary artery disease)   Hypertension   Hypothyroidism   Carotid artery disease (HCC)   Systolic murmur   DMII (diabetes mellitus, type 2) (HCC)   Congestive heart failure (CHF) (HCC)   Chest pain, Hx of CAD, Trop 4-> 4-> 5 Cath 2006-> borderline OM disease, mod RCA disease Nuclear stress test 05/16/2021-> negative for ischemia Appreciate cardiology consult Avoid nitro as suggested by cardiology due to hypotension Use morphine for chest pain if needed Cont Aspirin '81mg'$  po qday Cont Lipitor '20mg'$  po qday Check cardiac echo, nuclear stress test   Congestive heart failure (CHF) ([place]) Echo 02/2022 EF of 60 to 65% with G2DD. Resume Lasix at lower dose 40->  '20mg'$  po qday  Hx of Carotid stenosis Cont aspirin Cont plavix '75mg'$  po qday   DMII (diabetes mellitus, type 2) (HCC) Controlled, A1c 6.1. SSI- S Hold home metformin   Hypertension Systolic down to 24P with nitro has improved currently 120s. Holding Lisinopril Restart Lasix at lower dose '20mg'$  po qday   Hypothyroidism Cont Levothyroxine  Bph Cont Flomax 0.'8mg'$  po qhs  Hypokalemia Kcl 26mq pox1 Check bmp in am  Code Status : FULL CODE  Family Communication  : Wife present in room  Disposition Plan  :  home depending upon results of stress testing  Barriers For Discharge : none  Consults  :  Cardiology  Procedures  :  Nuclear stress test 6/1 Cardiac echo 6/1  DVT Prophylaxis  :  Lovenox  Lab Results  Component Value Date   PLT 153 05/27/2022    Antibiotics  :  none  Anti-infectives (From admission, onward)    None         Objective:   Vitals:   05/28/22 0124  05/28/22 0433 05/28/22 0838 05/28/22 0841  BP: (!) 102/57 (!) 100/56 121/68 114/63  Pulse: (!) 51 (!) 52 (!) 54 (!) 54  Resp: '19 19 19 18  '$ Temp: 98 F (36.7 C) 98 F (36.7 C) 97.9 F (36.6 C) 97.9 F (36.6 C)  TempSrc: Oral Oral Oral Oral  SpO2: 94% 92% 95% 95%  Weight:      Height:        Wt Readings from Last 3 Encounters:  05/27/22 75.9 kg  05/06/22 75.9 kg  05/05/22 75.8 kg     Intake/Output Summary (Last 24 hours) at 05/28/2022 1056 Last data filed at 05/28/2022 0900 Gross per 24 hour  Intake 236 ml  Output 400 ml  Net -164 ml     Physical Exam  Awake Alert, Oriented X 3,  Heent: anicteric Neck: no jvd, + bruit Heart: rrr s1, s2, no m/g/r Lung: CTAB Abd: soft, obese, nt, nd, +bs Ext: no c/c/e,  Skin: no rash, slight rosacea   Data Review:    CBC Recent Labs  Lab 05/27/22 1352  WBC 4.8  HGB 14.3  HCT 42.2  PLT 153  MCV 89.2  MCH 30.2  MCHC 33.9  RDW 14.2    Chemistries  Recent Labs  Lab 05/27/22 1352  NA 138  K 3.5  CL 105  CO2 26  GLUCOSE 117*  BUN 18  CREATININE 0.95  CALCIUM 9.2  MG 1.7   ------------------------------------------------------------------------------------------------------------------ No results for input(s): CHOL, HDL, LDLCALC, TRIG, CHOLHDL, LDLDIRECT in the last 72 hours.  Lab Results  Component Value Date   HGBA1C 6.1 (H) 02/28/2022   ------------------------------------------------------------------------------------------------------------------ No results for input(s): TSH, T4TOTAL, T3FREE, THYROIDAB in the last 72 hours.  Invalid input(s): FREET3 ------------------------------------------------------------------------------------------------------------------  No results for input(s): VITAMINB12, FOLATE, FERRITIN, TIBC, IRON, RETICCTPCT in the last 72 hours.  Coagulation profile No results for input(s): INR, PROTIME in the last 168 hours.  Recent Labs    05/27/22 1352  DDIMER 0.33    Cardiac  Enzymes No results for input(s): CKMB, TROPONINI, MYOGLOBIN in the last 168 hours.  Invalid input(s): CK ------------------------------------------------------------------------------------------------------------------    Component Value Date/Time   BNP 43.0 05/27/2022 1352    Inpatient Medications  Scheduled Meds:  aspirin EC  81 mg Oral Daily   atorvastatin  20 mg Oral Daily   clopidogrel  75 mg Oral Daily   enoxaparin (LOVENOX) injection  40 mg Subcutaneous Q24H   fluticasone  2 spray Each Nare Daily   levothyroxine  150 mcg Oral Q72H   And   [START ON 05/29/2022] levothyroxine  175 mcg Oral Q72H   And   [START ON 05/30/2022] levothyroxine  150 mcg Oral Q72H   mometasone  1 application. Topical QHS   prednisoLONE acetate  1 drop Left Eye Daily   tamsulosin  0.8 mg Oral QHS   Continuous Infusions: PRN Meds:.acetaminophen **OR** acetaminophen, morphine injection, ondansetron **OR** ondansetron (ZOFRAN) IV, polyethylene glycol, technetium tetrofosmin  Micro Results No results found for this or any previous visit (from the past 240 hour(s)).  Radiology Reports DG Chest Port 1 View  Result Date: 05/27/2022 CLINICAL DATA:  Left-sided chest pain EXAM: PORTABLE CHEST 1 VIEW COMPARISON:  03/30/2022 FINDINGS: No new consolidation. Mild right basilar atelectasis/scarring. No significant pleural effusion. No pneumothorax. Normal heart size. IMPRESSION: Mild right basilar atelectasis/scarring. Electronically Signed   By: Macy Mis M.D.   On: 05/27/2022 14:05   VAS US CAROTID  Result Date: 05/04/2022 Carotid Arterial Duplex Study Patient Name:  MATTISON STUCKEY  Date of Exam:   05/04/2022 Medical Rec #: [id number]         Accession #:    [id number] Date of Birth: Nov 12, 1946         Patient Gender: M Patient Age:   25 years Exam Location:  [facility] Procedure:      VAS US CAROTID Referring Phys: Harold Barban  --------------------------------------------------------------------------------  Indications:       Carotid artery disease and Right TCAR post-op evaluation.                    Surgery date 03/25/2022 Risk Factors:      Hypertension, hyperlipidemia, past history of smoking,                    coronary artery disease, PAD. Comparison Study:  10/30/2021                    R=60-79, L=40-59 Performing Technologist: Ronal Fear RVS, RCS  Examination Guidelines: A complete evaluation includes B-mode imaging, spectral Doppler, color Doppler, and power Doppler as needed of all accessible portions of each vessel. Bilateral testing is considered an integral part of a complete examination. Limited examinations for reoccurring indications may be performed as noted.  Right Carotid Findings: +----------+--------+--------+--------+------------------+--------+           PSV cm/sEDV cm/sStenosisPlaque DescriptionComments +----------+--------+--------+--------+------------------+--------+ CCA Prox  104     14                                         +----------+--------+--------+--------+------------------+--------+ CCA Mid   53  13                                         +----------+--------+--------+--------+------------------+--------+ CCA Distal49      17                                stent    +----------+--------+--------+--------+------------------+--------+ ICA Prox  87      36      1-39%   heterogenous      stent    +----------+--------+--------+--------+------------------+--------+ ICA Mid   110     38                                stent    +----------+--------+--------+--------+------------------+--------+ ICA Distal110     39                                         +----------+--------+--------+--------+------------------+--------+ ECA       90      9                                          +----------+--------+--------+--------+------------------+--------+  +----------+--------+-------+----------------+-------------------+           PSV cm/sEDV cmsDescribe        Arm Pressure (mmHG) +----------+--------+-------+----------------+-------------------+ Subclavian89             Multiphasic, WNL                    +----------+--------+-------+----------------+-------------------+ +---------+--------+--+--------+--+---------+ VertebralPSV cm/s39EDV cm/s11Antegrade +---------+--------+--+--------+--+---------+  Right Stent(s): +---------------+---+--++++ Prox to Stent  53 13 +---------------+---+--++++ Proximal Stent 49 17 +---------------+---+--++++ Mid Stent      87 36 +---------------+---+--++++ Distal Stent   88 28 +---------------+---+--++++ Distal to [id number] +---------------+---+--++++   Left Carotid Findings: +----------+--------+--------+--------+-------------------------+--------+           PSV cm/sEDV cm/sStenosisPlaque Description       Comments +----------+--------+--------+--------+-------------------------+--------+ CCA Prox  109     20                                                +----------+--------+--------+--------+-------------------------+--------+ CCA Mid   79      18              smooth                            +----------+--------+--------+--------+-------------------------+--------+ CCA Distal82      23              smooth                            +----------+--------+--------+--------+-------------------------+--------+ ICA Prox  143     51      40-59%  calcific                          +----------+--------+--------+--------+-------------------------+--------+ ICA  Mid   136     39              calcific and heterogenous         +----------+--------+--------+--------+-------------------------+--------+ ICA Distal148     48                                                +----------+--------+--------+--------+-------------------------+--------+  ECA       96      13                                                +----------+--------+--------+--------+-------------------------+--------+ +----------+--------+--------+----------------+-------------------+           PSV cm/sEDV cm/sDescribe        Arm Pressure (mmHG) +----------+--------+--------+----------------+-------------------+ [id number]             Multiphasic, WNL                    +----------+--------+--------+----------------+-------------------+ +---------+--------+--+--------+--+---------+ VertebralPSV cm/s80EDV cm/s23Antegrade +---------+--------+--+--------+--+---------+   Summary: Right Carotid: Velocities in the right ICA are consistent with a 1-39% stenosis.                Patent stent. Left Carotid: Velocities in the left ICA are consistent with a 40-59% stenosis. Vertebrals:  Bilateral vertebral arteries demonstrate antegrade flow. Subclavians: Normal flow hemodynamics were seen in bilateral subclavian              arteries. *See table(s) above for measurements and observations.  Electronically signed by Harold Barban MD on 05/04/2022 at 6:46:07 PM.    Final     Time Spent in minutes  30  Pt admitted observation, pt will continue to be observed for his chest pain, status to remain observation pending results of echo and stress testing  Jani Gravel M.D on 05/28/2022 at 10:56 AM  Between 7am to 7pm - Pager - [phone]  After 7pm go to [email or web address] - password [facility]  [facility] -  Office  [phone]

## 2022-05-28 NOTE — Progress Notes (Signed)
*  PRELIMINARY RESULTS* Echocardiogram Limited 2D Echocardiogram has been performed.  Christopher Randolph 05/28/2022, 10:22 AM

## 2022-05-28 NOTE — Discharge Summary (Signed)
Patient ID: Christopher Randolph MRN: [id number] DOB/AGE: 03/08/1946 76 y.o.  Admit date: 05/27/2022 Discharge date: 05/28/2022  PCP:  Curlene Labrum, MD [place]   Disposition and Follow-up: DISPOSITION: home   DIET: cardiac  ACTIVITY: as tolerated FOLLOW UP:   Judd Lien (PCP) in 1-2 weeks for f/u of bp / weight and potassium  Rozann Lesches (cardiology) in 1-2 weeks for f/u of chest pain and weight / bmp since lasix decreased from '40mg'$  -> '20mg'$  po qday.      Chest pain, Hx of CAD, Trop 4-> 4-> 5 Cath 2006-> borderline OM disease, mod RCA disease Nuclear stress test 6/1-> negative for ischemia Cardiac echo 6/1-> EF 60-65% Cont Aspirin '81mg'$  po qday Cont Lipitor '20mg'$  po qday   Congestive heart failure (CHF) (HCC) Resume Lasix at lower dose 40->  '20mg'$  po qday as per cardiology F/u with PCP or cardiology in 1-2 weeks for weight check and bmp   Hx of Carotid stenosis Cont aspirin Cont plavix '75mg'$  po qday   DMII (diabetes mellitus, type 2) (HCC) Controlled, A1c 6.1. Continue Metformin   Hypertension Systolic down to 30Q with nitro has improved currently 120s. Restart Lasix at lower dose '20mg'$  po qday Cont Lisinopril   Hypothyroidism Cont Levothyroxine   Bph Cont Flomax 0.'4mg'$  po qhs   Borderline Hypokalemia Kcl 100mq pox1 Check bmp in 1-2 weeks with pcp or cardiology   Discharge Diagnoses:   Present on Admission:  Chest pain  CAD (coronary artery disease)  Hypertension  Hypothyroidism  Systolic murmur  Carotid artery disease (HCC)   Consults:  Dr. JCarlyle Dolly(cardiology)  Admitting Physician: Dr. EJenetta Downer Discharge Medications: Allergies as of 05/28/2022       Reactions   Erythromycin Swelling   swelling of lips   Shellfish Allergy Rash, Other (See Comments)   Gout flares   Azithromycin Swelling   Nitrofuran Derivatives         Medication List     TAKE these medications    acetaminophen 325 MG  tablet Commonly known as: TYLENOL Take 650 mg by mouth every 6 (six) hours as needed.   aspirin EC 81 MG tablet Take 81 mg by mouth daily. Swallow whole.   atorvastatin 20 MG tablet Commonly known as: LIPITOR TAKE 1 TABLET BY MOUTH DAILY   Breztri Aerosphere 160-9-4.8 MCG/ACT Aero Generic drug: Budeson-Glycopyrrol-Formoterol Inhale 2 puffs into the lungs in the morning and at bedtime.   clopidogrel 75 MG tablet Commonly known as: Plavix Take 1 tablet (75 mg total) by mouth daily.   cycloSPORINE 0.05 % ophthalmic emulsion Commonly known as: RESTASIS Place 1 drop into both eyes daily.   fluticasone 50 MCG/ACT nasal spray Commonly known as: FLONASE Place 2 sprays into both nostrils daily.   furosemide 20 MG tablet Commonly known as: LASIX Take 1 tablet (20 mg total) by mouth daily. Start taking on: May 29, 2022 What changed:  medication strength how much to take   latanoprost 0.005 % ophthalmic solution Commonly known as: XALATAN Place 1 drop into the left eye at bedtime.   levothyroxine 150 MCG tablet Commonly known as: SYNTHROID Take 150 mcg by mouth See admin instructions. Alternates taking 150 mg daily for 2 days then 175 mcg the third day then repeat   lisinopril 2.5 MG tablet Commonly known as: ZESTRIL Take 1 tablet (2.5 mg total) by mouth daily. What changed: how much to take   metFORMIN 500 MG tablet Commonly known as: GLUCOPHAGE Take 500 mg  by mouth daily.   mometasone 0.1 % ointment Commonly known as: ELOCON Apply 1 application. topically at bedtime.   nitroGLYCERIN 0.4 MG SL tablet Commonly known as: NITROSTAT DISSOLVE 1 TABLET UNDER THE TONGUE EVERY 5 MINUTES AS NEEDED FOR CHEST PAIN. DO NOT EXCEED A TOTAL OF 3 DOSES IN 15 MINUTES. What changed: See the new instructions.   prednisoLONE acetate 1 % ophthalmic suspension Commonly known as: PRED FORTE Place 1 drop into the left eye daily.   tamsulosin 0.4 MG Caps capsule Commonly known as:  FLOMAX Take 0.8 mg by mouth at bedtime.         Brief H and P: 76 y.o. male with medical history significant for diastolic CHF, peripheral carotid and coronary artery disease, hypertension diabetes mellitus and COPD.  Patient presented to the ED with complaints of intermittent chest pain over the past 2 to 3 days.  Chest pain occur at rest and with exertion, and is associated with dizziness when standing.  Reports gradual worsening of chest pain, and today chest pain was worse lasting about 6 to 7 hours somewhat improved with nitro given in ED.  Chest pain No associated difficulty breathing no nausea no vomiting.  He describes the chest pain as " intense", and radiates to the back.  He has chronic back pain, with surgery and rods placed but reports over the past week and a half his back pain has been worse. No lower extremity swelling.  No history of blood clots in legs or lungs.   ED Course: Temperature 98.1.  Heart rate 59-73.  Respirate rate 14-21.  Blood pressure initially 137/75, dropped to 70/51 after nitro given.  O2 sats greater than 92% on room air. Troponin 4, BNP 43.  Chest x-ray without acute abnormality.  EKG unchanged from prior.  ED consulted cardiology, recommended admission, trend troponin, heparin only if EKG changes and positive troponin, morphine for pain.   Hospital Course:  Lisinopril and Lasix held initially due to low bp. Potassium was borderline low at 3.5 on admission.  Pt received Kdur 81mq po x1.    Christopher Randolph today states that his chest pain resolved.  Pt states that has not had any discomfort since going to sleep last nite after morphine.  Pt denies fever, chills, cough, palp , sob, n/v, gerd. Pt was seen by Dr. BHarl Bowiefrom Cardiology this AM who recommended resuming Lasix at lower dose '20mg'$  po qday.    Cardiac Echo 6/1=>  1.Left ventricular ejection fraction, by estimation, is 60 to 65%. The left ventricle has no regional wall motion abnormalities. There is  mild left ventricular hypertrophy. 2. Right ventricular systolic function is normal. The right ventricular size is normal. The aortic valve is tricuspid. There is moderate calcification of the aortic valve. There is moderate thickening of the aortic valve.  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 There is no current ischemia. The study is low risk.   0.5 mm of ST depression (II, III and aVF) was noted. Lateral precordial T wave inversion.   LV perfusion is abnormal.   Left ventricular function is normal. Nuclear stress EF: 64 %. The left ventricular ejection fraction is normal (55-65%). End diastolic cavity size is normal.  Per cardiology , chest pain thought to be atypical and no significant EKG changes per note.  Trop 4-> 4-> 5.   Pt will be discharged to home.   Principal Problem:   Chest pain Active Problems:  CAD (coronary artery disease)   Hypertension   Hypothyroidism   Carotid artery disease (HCC)   Systolic murmur   DMII (diabetes mellitus, type 2) (HCC)   Congestive heart failure (CHF) (HCC)   Day of Discharge BP (!) 129/58 (BP Location: Right Arm)   Pulse 61   Temp 98.2 F (36.8 C) (Oral)   Resp 18   Ht '5\' 8"'$  (1.727 m)   Wt 75.9 kg   SpO2 95%   BMI 25.44 kg/m   Physical Exam: General: Alert and awake oriented x3 not in any acute distress. HEENT: anicteric   Neck: no jvd, + bruit  Heart: rrr s1, s2, no m/g/r Lung: CTAB Abd: soft, obese, nt, nd, +bs Ext: no c/c/e,  Skin: no rash, slight rosacea  The results of significant diagnostics from this hospitalization (including imaging, microbiology, ancillary and laboratory) are listed below for reference.    LAB RESULTS: Basic Metabolic Panel: Recent Labs  Lab 05/27/22 1352  NA 138  K 3.5  CL 105  CO2 26  GLUCOSE 117*  BUN 18  CREATININE 0.95  CALCIUM 9.2  MG 1.7   Liver Function Tests: No results for input(s): AST, ALT, ALKPHOS, BILITOT, PROT,  ALBUMIN in the last 168 hours. No results for input(s): LIPASE, AMYLASE in the last 168 hours. No results for input(s): AMMONIA in the last 168 hours. CBC: Recent Labs  Lab 05/27/22 1352  WBC 4.8  HGB 14.3  HCT 42.2  MCV 89.2  PLT 153   Cardiac Enzymes: No results for input(s): CKTOTAL, CKMB, CKMBINDEX, TROPONINI in the last 168 hours. BNP: Invalid input(s): POCBNP CBG: No results for input(s): GLUCAP in the last 168 hours.  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 There is no current ischemia. The study is low risk.   0.5 mm of ST depression (II, III and aVF) was noted. Lateral precordial T wave inversion.   LV perfusion is abnormal.   Left ventricular function is normal. Nuclear stress EF: 64 %. The left ventricular ejection fraction is normal (55-65%). End diastolic cavity size is normal.   DG Chest Port 1 View  Result Date: 05/27/2022 CLINICAL DATA:  Left-sided chest pain EXAM: PORTABLE CHEST 1 VIEW COMPARISON:  03/30/2022 FINDINGS: No new consolidation. Mild right basilar atelectasis/scarring. No significant pleural effusion. No pneumothorax. Normal heart size. IMPRESSION: Mild right basilar atelectasis/scarring. Electronically Signed   By: Macy Mis M.D.   On: 05/27/2022 14:05   ECHOCARDIOGRAM LIMITED  Result Date: 05/28/2022    ECHOCARDIOGRAM LIMITED REPORT   Patient Name:   Christopher Randolph Date of Exam: 05/28/2022 Medical Rec #:  [id number]        Height:       68.0 in Accession #:    [id number]       Weight:       167.3 lb Date of Birth:  1946/11/18        BSA:          1.895 m Patient Age:    23 years         BP:           100/56 mmHg Patient Gender: M                HR:           50 bpm. Exam Location:  [facility] Procedure: Limited Echo Indications:  Chest Pain  History:        Patient has prior history of Echocardiogram examinations, most                  recent 02/28/2022. CHF, CAD, PAD, TIA and COPD,                 Signs/Symptoms:Dizziness/Lightheadedness and Chest Pain; Risk                 Factors:Hypertension, Diabetes, Dyslipidemia and Former Smoker.  Sonographer:    Wenda Low Referring Phys: [place]  1. Left ventricular ejection fraction, by estimation, is 60 to 65%. The left ventricle has no regional wall motion abnormalities. There is mild left ventricular hypertrophy.  2. Right ventricular systolic function is normal. The right ventricular size is normal.  3. The aortic valve is tricuspid. There is moderate calcification of the aortic valve. There is moderate thickening of the aortic valve. FINDINGS  Left Ventricle: Left ventricular ejection fraction, by estimation, is 60 to 65%. The left ventricle has no regional wall motion abnormalities. The left ventricular internal cavity size was normal in size. There is mild left ventricular hypertrophy. Right Ventricle: The right ventricular size is normal. Right vetricular wall thickness was not well visualized. Right ventricular systolic function is normal. Aortic Valve: The aortic valve is tricuspid. There is moderate calcification of the aortic valve. There is moderate thickening of the aortic valve. There is moderate aortic valve annular calcification. Aorta: The aortic root is normal in size and structure. LEFT VENTRICLE PLAX 2D LVIDd:         4.80 cm LVIDs:         3.00 cm LV PW:         1.10 cm LV IVS:        1.10 cm LVOT diam:     2.00 cm LVOT Area:     3.14 cm  LV Volumes (MOD) LV vol d, MOD A2C: 59.1 ml LV vol d, MOD A4C: 56.6 ml LV vol s, MOD A2C: 19.6 ml LV vol s, MOD A4C: 21.9 ml LV SV MOD A2C:     39.5 ml LV SV MOD A4C:     56.6 ml LV SV MOD BP:      38.2 ml LEFT ATRIUM         Index LA diam:    4.20 cm 2.22 cm/m   AORTA Ao Root diam: 3.20 cm  SHUNTS Systemic Diam: 2.00 cm Carlyle Dolly MD Electronically signed by Carlyle Dolly MD Signature  Date/Time: 05/28/2022/11:00:01 AM    Final        DISCHARGE FOLLOW-UP  Follow-up Information     Curlene Labrum, MD Follow up in 1 week(s).   Specialty: Family Medicine Why: f/u with PCP in 1-2 weeks to check weight, and increase Lasix dose if needed and to check bmp to follow up on potassium Contact information: [place] [address] [phone]         Satira Sark, MD Follow up in 1 week(s).   Specialty: Cardiology Why: f/u with cardiology in 1-2 weeks to follow up on chest pain and to check weight and increase lasix dose if needed and follow up on potassium level Contact information: [place] [address] [phone]                 Time spent on Discharge: 45 minutes  Signed:   Jeneen Rinks  Tanisia Yokley M.D. [facility] 05/28/2022, 2:55 PM Pager: [phone]  If 7PM-7AM, please contact night-coverage [email or web address] Password [email or web address]

## 2022-05-28 NOTE — Progress Notes (Signed)
Pt off unit for procedure.

## 2022-05-28 NOTE — Discharge Instructions (Addendum)
Please follow up with PCP or cardiology in 1-2 weeks to check weight and bmp (for potassium), and to adjust Lasix dose  -1)follow up Dr. Sherley Bounds neurosurgeon [facility] for further evaluation of your back pain 2) follow-up with your vascular surgeon if you have any further concerns about the iliac stents 3)Avoid ibuprofen/Advil/Aleve/Motrin/Goody Powders/Naproxen/BC powders/Meloxicam/Diclofenac/Indomethacin and other Nonsteroidal anti-inflammatory medications as these will make you more likely to bleed and can cause stomach ulcers, can also cause Kidney problems.

## 2022-05-28 NOTE — Plan of Care (Signed)

## 2022-05-28 NOTE — Progress Notes (Addendum)
Progress Note  Patient Name: HITOSHI WERTS Date of Encounter: 05/28/2022  [facility] Cardiologist: Rozann Lesches, MD   Subjective   Chest pain resolved last night, no symptoms this morning.   Inpatient Medications    Scheduled Meds:  aspirin EC  81 mg Oral Daily   atorvastatin  20 mg Oral Daily   clopidogrel  75 mg Oral Daily   enoxaparin (LOVENOX) injection  40 mg Subcutaneous Q24H   fluticasone  2 spray Each Nare Daily   levothyroxine  150 mcg Oral Q72H   And   [START ON 05/29/2022] levothyroxine  175 mcg Oral Q72H   And   [START ON 05/30/2022] levothyroxine  150 mcg Oral Q72H   mometasone  1 application. Topical QHS   prednisoLONE acetate  1 drop Left Eye Daily   tamsulosin  0.8 mg Oral QHS   Continuous Infusions:  PRN Meds: acetaminophen **OR** acetaminophen, morphine injection, ondansetron **OR** ondansetron (ZOFRAN) IV, polyethylene glycol   Vital Signs    Vitals:   05/27/22 1829 05/27/22 2108 05/28/22 0124 05/28/22 0433  BP: 132/65 (!) 128/52 (!) 102/57 (!) 100/56  Pulse: (!) 57 (!) 56 (!) 51 (!) 52  Resp: '18 18 19 19  '$ Temp: 98.3 F (36.8 C) 97.8 F (36.6 C) 98 F (36.7 C) 98 F (36.7 C)  TempSrc: Oral Oral Oral Oral  SpO2: 93% 91% 94% 92%  Weight:      Height:        Intake/Output Summary (Last 24 hours) at 05/28/2022 0804 Last data filed at 05/27/2022 1835 Gross per 24 hour  Intake 236 ml  Output 400 ml  Net -164 ml      05/27/2022    1:39 PM 05/06/2022   11:12 AM 05/05/2022   12:50 PM  Last 3 Weights  Weight (lbs) 167 lb 5.3 oz 167 lb 6.4 oz 167 lb  Weight (kg) 75.9 kg 75.932 kg 75.751 kg      Telemetry    SR and sinus brady - Personally Reviewed  ECG    N/a - Personally Reviewed  Physical Exam   GEN: No acute distress.   Neck: No JVD Cardiac: RRR, no murmurs, rubs, or gallops.  Respiratory: Clear to auscultation bilaterally. GI: Soft, nontender, non-distended  MS: No edema; No deformity. Neuro:  Nonfocal  Psych: Normal  affect   Labs    High Sensitivity Troponin:   Recent Labs  Lab 05/27/22 1352 05/27/22 1543 05/28/22 0515  TROPONINIHS '4 4 5     '$ Chemistry Recent Labs  Lab 05/27/22 1352  NA 138  K 3.5  CL 105  CO2 26  GLUCOSE 117*  BUN 18  CREATININE 0.95  CALCIUM 9.2  MG 1.7  GFRNONAA >60  ANIONGAP 7    Lipids No results for input(s): CHOL, TRIG, HDL, LABVLDL, LDLCALC, CHOLHDL in the last 168 hours.  Hematology Recent Labs  Lab 05/27/22 1352  WBC 4.8  RBC 4.73  HGB 14.3  HCT 42.2  MCV 89.2  MCH 30.2  MCHC 33.9  RDW 14.2  PLT 153   Thyroid No results for input(s): TSH, FREET4 in the last 168 hours.  BNP Recent Labs  Lab 05/27/22 1352  BNP 43.0    DDimer  Recent Labs  Lab 05/27/22 1352  DDIMER 0.33     Radiology    DG Chest Port 1 View  Result Date: 05/27/2022 CLINICAL DATA:  Left-sided chest pain EXAM: PORTABLE CHEST 1 VIEW COMPARISON:  03/30/2022 FINDINGS: No new consolidation.  Mild right basilar atelectasis/scarring. No significant pleural effusion. No pneumothorax. Normal heart size. IMPRESSION: Mild right basilar atelectasis/scarring. Electronically Signed   By: Macy Mis M.D.   On: 05/27/2022 14:05    Cardiac Studies    Patient Profile     HEZZIE KARIM is a 76 y.o. male with a hx of 76 yo male history of HFpEF, CAD with moderate RCA disease by cath 2006, prior TIA with carotid stenosis s/p right transcarotid revasc 02/2022 on plavix, HTN, HL, DM2 who is being seen 05/27/2022 for the evaluation of chest pain at the request of Dr Tomi Bamberger.  Assessment & Plan    1.Chest pain -cath 2006 borderline OM disease, , mod RCA disease - 04/2021 nuclear stress [facility] no ischemia - some atypical features in that pain constant approx 12 hours though variable in severity, some tenderness left chest wall with palpation. Chest pain has resolved this AM - trops neg, EKG without ischemic chnages -Limited echo tomorrow AM reassess LV function.  - obtain lexiscan this  AM - normal CT PE just last month with normal aorta, symptoms not typical for dissection.  - avoid additional NG given drop in bp in ER, would use prn morphine.      2. Chronic HFpEF - soft bp's, some orthostatic symptoms at times - lasix on hold, would restart at lower dose '20mg'$  daily at discharge.    3. History of carotid stenosis - he is on plavix for prior carotid intervention, not for cardiac indication.   Addendum 145 pm - Echo with normal LVEF, no WMAs. Lexiscan with prior inferior infarct but no current ischemia. I suspect his moderate RCA disease from 2006 at some point did progress and cause some inferior infarction at some point in the past, but there is no current ischemia in this area. Essentially 12 hours of chest pain with some atypical features that has resolved with benign EKG, enzymes, echo, and stress test. No further caridac testing indicated at this time, we will sign off inpatient care and arrange outpatient follow up   For questions or updates, please contact [place] Please consult [email or web address] for contact info under        Signed, Carlyle Dolly, MD  05/28/2022, 8:04 AM

## 2022-05-28 NOTE — Progress Notes (Signed)
    SATURATION QUALIFICATIONS: (This note is used to comply with regulatory documentation for home oxygen)   Patient Saturations on Room Air at Rest = 96 %   Patient Saturations on Room Air while Ambulating = 92 to 94 %      --Patient No longer meets requirement for home O2 at this time  Roxan Hockey, MD

## 2022-06-02 DIAGNOSIS — E039 Hypothyroidism, unspecified: Secondary | ICD-10-CM | POA: Diagnosis not present

## 2022-06-02 DIAGNOSIS — I10 Essential (primary) hypertension: Secondary | ICD-10-CM | POA: Diagnosis not present

## 2022-06-02 DIAGNOSIS — E1151 Type 2 diabetes mellitus with diabetic peripheral angiopathy without gangrene: Secondary | ICD-10-CM | POA: Diagnosis not present

## 2022-06-02 DIAGNOSIS — I209 Angina pectoris, unspecified: Secondary | ICD-10-CM | POA: Diagnosis not present

## 2022-06-02 DIAGNOSIS — I25119 Atherosclerotic heart disease of native coronary artery with unspecified angina pectoris: Secondary | ICD-10-CM | POA: Diagnosis not present

## 2022-06-02 DIAGNOSIS — I5033 Acute on chronic diastolic (congestive) heart failure: Secondary | ICD-10-CM | POA: Diagnosis not present

## 2022-06-02 DIAGNOSIS — R079 Chest pain, unspecified: Secondary | ICD-10-CM | POA: Diagnosis not present

## 2022-06-02 DIAGNOSIS — E876 Hypokalemia: Secondary | ICD-10-CM | POA: Diagnosis not present

## 2022-06-03 ENCOUNTER — Other Ambulatory Visit: Payer: Self-pay | Admitting: Cardiology

## 2022-06-12 ENCOUNTER — Other Ambulatory Visit: Payer: Self-pay | Admitting: Cardiology

## 2022-06-12 DIAGNOSIS — H35352 Cystoid macular degeneration, left eye: Secondary | ICD-10-CM | POA: Diagnosis not present

## 2022-06-12 DIAGNOSIS — Z961 Presence of intraocular lens: Secondary | ICD-10-CM | POA: Diagnosis not present

## 2022-06-12 DIAGNOSIS — H57813 Brow ptosis, bilateral: Secondary | ICD-10-CM | POA: Diagnosis not present

## 2022-06-12 DIAGNOSIS — T8579XS Infection and inflammatory reaction due to other internal prosthetic devices, implants and grafts, sequela: Secondary | ICD-10-CM | POA: Diagnosis not present

## 2022-06-12 DIAGNOSIS — E119 Type 2 diabetes mellitus without complications: Secondary | ICD-10-CM | POA: Diagnosis not present

## 2022-06-12 DIAGNOSIS — H02831 Dermatochalasis of right upper eyelid: Secondary | ICD-10-CM | POA: Diagnosis not present

## 2022-06-12 DIAGNOSIS — H16122 Filamentary keratitis, left eye: Secondary | ICD-10-CM | POA: Diagnosis not present

## 2022-06-12 DIAGNOSIS — H02834 Dermatochalasis of left upper eyelid: Secondary | ICD-10-CM | POA: Diagnosis not present

## 2022-06-12 DIAGNOSIS — H16223 Keratoconjunctivitis sicca, not specified as Sjogren's, bilateral: Secondary | ICD-10-CM | POA: Diagnosis not present

## 2022-06-16 NOTE — Progress Notes (Signed)
[facility] Note  06/17/2022     CHIEF COMPLAINT Patient presents for Retina Follow Up  HISTORY OF PRESENT ILLNESS: Christopher Randolph is a 76 y.o. male who presents to the clinic today for:   HPI     Retina Follow Up   Patient presents with  Other.  In left eye.  This started 2 months ago.  Severity is moderate.  Duration of 2 months.  Since onset it is stable.  I, the attending physician,  performed the HPI with the patient and updated documentation appropriately.        Comments   Pt here for ret eval per Dr. Midge Aver due to CME OS. Pt states he noticed a change in [place] a few months ago. Hes had a lot of health changes since March 2023. Pt reports mini stroke which affected VA in OD but after taking ASA it cleared up, he had a stent procedure then went back to the hospital a few more times for fluid retention and BP bottoming out. Pt noticed a change in [place] in OS around the same time this all started. Reports central VA in OS has a spot-yellow/green color. Pt does have new rx specs. Last A1C taken was 6.7. Pt reports taking 1 gtt Pred forte in OS in the am and Latanoprost 1 gtt QHS      Last edited by [name], MD on 06/17/2022  1:01 PM.    Pt is here today on the referral of Dr. Midge Aver for CME OS, pt states he had seen Dr. [name] not too long ago and gotten new glasses, but his left eye was still blurry, he states his eye was also burning and stinging, he went back to see Dr. [name] last Friday who told him he had swelling in his left eye and needed to come back here, pt is using PF once in the morning and latanoprost once at night   Referring physician: [name], MD [address] [place],  [place] [address]  HISTORICAL INFORMATION:   Selected notes from the [facility] Referred by Dr. [name] for concern of vitreous hemorrhage   CURRENT MEDICATIONS: Current Outpatient Medications (Ophthalmic Drugs)  Medication  Sig   Bromfenac Sodium (PROLENSA) 0.07 % SOLN Apply 1 drop to eye 4 (four) times daily.   cycloSPORINE (RESTASIS) 0.05 % ophthalmic emulsion Place 1 drop into both eyes daily.   latanoprost (XALATAN) 0.005 % ophthalmic solution Place 1 drop into the left eye at bedtime.   prednisoLONE acetate (PRED FORTE) 1 % ophthalmic suspension Place 1 drop into the left eye 4 (four) times daily.   No current facility-administered medications for this visit. (Ophthalmic Drugs)   Current Outpatient Medications (Other)  Medication Sig   acetaminophen (TYLENOL) 325 MG tablet Take 650 mg by mouth every 6 (six) hours as needed.   aspirin EC 81 MG tablet Take 81 mg by mouth daily. Swallow whole.   atorvastatin (LIPITOR) 20 MG tablet TAKE 1 TABLET BY MOUTH DAILY   Budeson-Glycopyrrol-Formoterol (BREZTRI AEROSPHERE) 160-9-4.8 MCG/ACT AERO Inhale 2 puffs into the lungs in the morning and at bedtime.   clopidogrel (PLAVIX) 75 MG tablet Take 1 tablet (75 mg total) by mouth daily.   fluticasone (FLONASE) 50 MCG/ACT nasal spray Place 2 sprays into both nostrils daily.   furosemide (LASIX) 20 MG tablet Take 1 tablet (20 mg total) by mouth daily.   levothyroxine (SYNTHROID, LEVOTHROID) 150 MCG tablet  Take 150 mcg by mouth See admin instructions. Alternates taking 150 mg daily for 2 days then 175 mcg the third day then repeat   lisinopril (ZESTRIL) 2.5 MG tablet Take 1 tablet (2.5 mg total) by mouth daily. (Patient taking differently: Take 5 mg by mouth daily.)   metFORMIN (GLUCOPHAGE) 500 MG tablet Take 500 mg by mouth daily.   mometasone (ELOCON) 0.1 % ointment Apply 1 application. topically at bedtime.   nitroGLYCERIN (NITROSTAT) 0.4 MG SL tablet PLACE 1 TABLET UNDER THE TONGUE EVERY 5 MINUTES FOR 3 DOSES AS NEEDED CHEST PAIN   oxyCODONE-acetaminophen (PERCOCET) 5-325 MG tablet Take 1 tablet by mouth every 4 (four) hours as needed for severe pain.   tamsulosin (FLOMAX) 0.4 MG CAPS capsule Take 0.8 mg by mouth at  bedtime.   No current facility-administered medications for this visit. (Other)   REVIEW OF SYSTEMS: ROS   Positive for: Musculoskeletal, Cardiovascular, Eyes Negative for: Constitutional, Gastrointestinal, Neurological, Skin, Genitourinary, HENT, Endocrine, Respiratory, Psychiatric, Allergic/Imm, Heme/Lymph Last edited by Kingsley Spittle, COT on 06/17/2022  8:46 AM.      ALLERGIES Allergies  Allergen Reactions   Erythromycin Swelling    swelling of lips   Shellfish Allergy Rash and Other (See Comments)    Gout flares   Azithromycin Swelling   Nitrofuran Derivatives    PAST MEDICAL HISTORY Past Medical History:  Diagnosis Date   Arthritis    Brain TIA 02/26/2022   CAD (coronary artery disease)    Branch vessel and moderate RCA disease 2006   Cancer ([place]) 1990   Melanoma Lower right Leg   Carotid artery disease ([place])    Complication of anesthesia 2020   blood presure dropped when  started to put payient , blood preesure.   COPD (chronic obstructive pulmonary disease) (HCC)    Essential hypertension    Glaucoma    Hyperlipidemia    Hypothyroidism    Lyme disease    PAD (peripheral artery disease) (HCC)    Left common iliac stent 2004   Pneumonia    TIA (transient ischemic attack) 02/2022   Type 2 diabetes mellitus ([place])    Wears dentures    Wears glasses    Past Surgical History:  Procedure Laterality Date   CATARACT EXTRACTION Bilateral    CATARACT EXTRACTION, BILATERAL     CERVICAL DISC SURGERY     x 1   COLONOSCOPY N/A 11/06/2016   Procedure: COLONOSCOPY;  Surgeon: Daneil Dolin, MD;  [facility];  Service: Endoscopy;  Laterality: N/A;  7:30 AM   COLONOSCOPY  2012   Dr. Anthony Sar: normal. reviewed reports, which states he has a history of polyps in remote past.    ELBOW SURGERY Left    EYE SURGERY Bilateral    Cat Sx   EYE SURGERY Left 01/14/2021   [facility] - Dr. Jovita Kussmaul   IRIDOTOMY / IRIDECTOMY Left 01/14/2021   [place] - Dr. Jovita Kussmaul    JOINT REPLACEMENT     LUMBAR WOUND DEBRIDEMENT N/A 01/24/2019   Procedure: LUMBAR WOUND Exploration for Evacation of Seroma vs. Hematoma;  Surgeon: Jovita Gamma, MD;  Location: [facility];  Service: Neurosurgery;  Laterality: N/A;   MELANOMA EXCISION     right leg, [facility] and underwent immunotherapy   NECK SURGERY      x 1 yrs ago   PARS PLANA VITRECTOMY Left 04/04/2020   Procedure: PARS PLANA VITRECTOMY WITH 25G REMOVAL/SUTURE INTRAOCULAR LENS;  Surgeon: [name], MD;  Location:  [facility];  Service: Ophthalmology;  Laterality: Left;   REPLACEMENT TOTAL KNEE Right    TRANSCAROTID ARTERY REVASCULARIZATION  Right 03/25/2022   Procedure: Right Transcarotid Artery Revascularization;  Surgeon: Serafina Mitchell, MD;  Location: [facility];  Service: Vascular;  Laterality: Right;   WISDOM TOOTH EXTRACTION     FAMILY HISTORY Family History  Problem Relation Age of Onset   Heart disease Mother    Heart attack Mother    Colon polyps Mother    Colon polyps Brother    Heart attack Maternal Grandmother    Heart attack Maternal Grandfather    Colon cancer Neg Hx    SOCIAL HISTORY Social History   Tobacco Use   Smoking status: Former    Packs/day: 1.50    Years: 40.00    Total pack years: 60.00    Types: Cigarettes    Start date: 11/04/1963    Quit date: 10/29/2003    Years since quitting: 18.6    Passive exposure: Never   Smokeless tobacco: Never  Vaping Use   Vaping Use: Never used  Substance Use Topics   Alcohol use: Yes    Alcohol/week: 15.0 standard drinks of alcohol    Types: 14 Cans of beer, 1 Standard drinks or equivalent per week    Comment: 2 beers or a mixed drink   Drug use: No       OPHTHALMIC EXAM:  Base Eye Exam     Visual Acuity (Snellen - Linear)       Right Left   Dist cc 20/15 20/40   Dist ph cc  NI    Correction: Glasses         Tonometry (Tonopen, 9:09 AM)       Right Left   Pressure 16 15         Pupils       Dark Light Shape React APD    Right 3 2 Round Brisk None   Left 3 2 Round Brisk None         Visual Fields       Left Right    Full Full         Extraocular Movement       Right Left    Full, Ortho Full, Ortho         Neuro/Psych     Oriented x3: Yes   Mood/Affect: Normal         Dilation     Both eyes: 1.0% Mydriacyl, 2.5% Phenylephrine @ 9:09 AM           Slit Lamp and Fundus Exam     External Exam       Right Left   External brow ptosis brow ptosis; mild periorbital edema and erythema         Slit Lamp Exam       Right Left   Lids/Lashes Dermatochalasis - upper lid, Telangiectasia, mild Meibomian gland dysfunction Dermatochalasis, Telangiectasia   Conjunctiva/Sclera Nasal and temporal Pinguecula temporal Pinguecula, ahmed plate and scleral patch graft ST quad -- nicely healed, bleb ST conj   Cornea well healed cataract wound well healed cataract wound   Anterior Chamber Deep and quiet, no cell/flare Deep, tube at 0100, No cell or pigment   Iris No NVI, Round and moderately dilated No NVI, moderately dilated to 4.5m, Transillumination defects from 0100-0200   Lens Posterior chamber intraocular lens in good position sutured Akreos IOL in good position   Anterior Vitreous vitreous syneresis post  vitrectomy, clear         Fundus Exam       Right Left   Disc Pink and Sharp Pink and Sharp, Compact, mild temporal PPA   C/D Ratio 0.2 0.3   Macula Flat, mild RPE mottling, No heme or edema Flat. blunted foveal reflex, central cystic changes, mild RPE mottling, No heme   Vessels normal, mildly Tortuous mild attenuation, mild tortuosity   Periphery Attached, round focal area of CR atrophy at 0630 mid zone Attached, No heme           Refraction     Wearing Rx       Sphere Cylinder Axis   Right +0.50 +1.00 178   Left -2.75 +2.50 175         Manifest Refraction       Sphere Cylinder Axis Dist VA   Right       Left -2.50 +2.50 005 20/30-2             IMAGING AND PROCEDURES  Imaging and Procedures for _0 @  OCT, Retina - OU - Both Eyes       Right Eye Quality was good. Central Foveal Thickness: 277. Progression has been stable. Findings include normal foveal contour, no IRF, no SRF (partial PVD - stable).   Left Eye Quality was good. Central Foveal Thickness: 402. Progression has worsened. Findings include normal foveal contour, no SRF, intraretinal fluid (Interval re-development of central cystic changes / CME).   Notes *Images captured and stored on drive  Diagnosis / Impression:  OD: NFP, no IRF, no SRF; partial PVD OS: Interval re-development of central cystic changes / CME  Clinical management:  See below  Abbreviations: NFP - Normal foveal profile. CME - cystoid macular edema. PED - pigment epithelial detachment. IRF - intraretinal fluid. SRF - subretinal fluid. EZ - ellipsoid zone. ERM - epiretinal membrane. ORA - outer retinal atrophy. ORT - outer retinal tubulation. SRHM - subretinal hyper-reflective material            ASSESSMENT/PLAN:    ICD-10-CM   1. Uveitis-hyphema-glaucoma syndrome of left eye  T85.398A OCT, Retina - OU - Both Eyes   H20.9    H40.42X0     2. 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 Pigmentary glaucoma of left eye, mild stage  H40.1321     4. Dislocated IOL (intraocular lens), anterior, left  H27.122     5. Cystoid macular edema of left eye  H35.352 OCT, Retina - OU - Both Eyes    6. Pseudophakia of both eyes  Z96.1     7. Lyme disease  A69.20     8. Dermatochalasis of both upper eyelids  H02.831    H02.834     9. Brow ptosis  H57.819      1-4. UGH Syndrome OS  - originally: 3 piece PCIOL OS centered, but iris has large TID in sup temp quadrant with IOL haptic within area  - tmax at [facility] 39 OS and was started on max drops (Cosopt, Brim, latan, and po diamox)  - repeat gonio showed open angles OS, mild focal PAS  - s/p PPV w/ IOL exchange / sutured secondary Akreos  OS IOL 04.08.21             - today, doing well, BCVA OS improved to 20/25 from 20/30 -- pt got new glasses which he attributes improvement to  - OCT shows stable improvement in IRF and central  pocket of SRF -- recurrent CME resolved             - IOP 15 OS  - s/p Ahmed Valve OS on 1.18.22 ([place])  - now released from Dr. Duayne Cal care  - currently on Latanoprost QHS OS and Pred qdaily OS  (Prolensa and Brim stopped per Dr. [name])  - monitor  5. CME OS -- recurrent  - mild CME that was improved on 7.21.21  - interval re-development of CME today 06.21.23 (prior recurrences 08.18.21 and 12.16.22)  - s/p STK OS (08.18.21)  - OCT today (06.21.23) shows interval re-development of central cystic changes / CME  - BCVA decreased to 20/40 from 20/25  - re-start PF and Prolensa QID OS  - f/u 3-4 weeks, DFE, OCT  6. Pseudophakia OU  - s/p CE/IOL OU -- pt can't remember dates, one eye by [facility], other eye by [facility]  - h/o UGH OS and now sutured IOL as above  - PCIOL OD appears to be in good position  - sutured Akreos IOL OS in good position  - monitor   7. Lyme disease  - history of diagnosis from target lesions on lower extremities  - no blood test confirmation performed  - completed doxycycline per PCP  - no manifestations in the eye, but will continue to monitor   8,9. Brow ptosis, dermatochalasis OU  - referred to Dr. Vickki Muff (now at [facility]) for eyelid eval  - holding on repair for now   Ophthalmic Meds Ordered this visit:  Meds ordered this encounter  Medications   Bromfenac Sodium (PROLENSA) 0.07 % SOLN    Sig: Apply 1 drop to eye 4 (four) times daily.    Dispense:  3 mL    Refill:  2   prednisoLONE acetate (PRED FORTE) 1 % ophthalmic suspension    Sig: Place 1 drop into the left eye 4 (four) times daily.    Dispense:  15 mL    Refill:  2     Return for f/u 3-4 weeks, CME OS, DFE, OCT.  There are no Patient Instructions on file for this visit.   This document  serves as a record of services personally performed by Gardiner Sleeper, MD, PhD. It was created on their behalf by Orvan Falconer, an ophthalmic technician. The creation of this record is the provider's dictation and/or activities during the visit.    Electronically signed by: Orvan Falconer, OA, 06/17/22  1:07 PM  This document serves as a record of services personally performed by Gardiner Sleeper, MD, PhD. It was created on their behalf by San Jetty. Owens Shark, OA an ophthalmic technician. The creation of this record is the provider's dictation and/or activities during the visit.    Electronically signed by: San Jetty. Owens Shark, [place] 06.21.2023 1:07 PM  Gardiner Sleeper, M.D., Ph.D. Diseases & Surgery of the Retina and Vitreous [facility]  I have reviewed the above documentation for accuracy and completeness, and I agree with the above. Gardiner Sleeper, M.D., Ph.D. 06/17/22 1:10 PM   Abbreviations: M myopia (nearsighted); A astigmatism; H hyperopia (farsighted); P presbyopia; Mrx spectacle prescription;  CTL contact lenses; OD right eye; OS left eye; OU both eyes  XT exotropia; ET esotropia; PEK punctate epithelial keratitis; PEE punctate epithelial erosions; DES dry eye syndrome; MGD meibomian gland dysfunction; ATs artificial tears; PFAT's preservative free artificial tears; [place] nuclear sclerotic cataract; PSC posterior subcapsular cataract; ERM epi-retinal membrane; PVD posterior vitreous detachment;  RD retinal detachment; DM diabetes mellitus; DR diabetic retinopathy; NPDR non-proliferative diabetic retinopathy; PDR proliferative diabetic retinopathy; CSME clinically significant macular edema; DME diabetic macular edema; dbh dot blot hemorrhages; CWS cotton wool spot; POAG primary open angle glaucoma; C/D cup-to-disc ratio; HVF humphrey visual field; GVF goldmann visual field; OCT optical coherence tomography; IOP intraocular pressure; BRVO Branch retinal vein occlusion; CRVO  central retinal vein occlusion; CRAO central retinal artery occlusion; BRAO branch retinal artery occlusion; RT retinal tear; SB scleral buckle; PPV pars plana vitrectomy; VH Vitreous hemorrhage; PRP panretinal laser photocoagulation; IVK intravitreal kenalog; VMT vitreomacular traction; MH Macular hole;  NVD neovascularization of the disc; NVE neovascularization elsewhere; AREDS age related eye disease study; ARMD age related macular degeneration; POAG primary open angle glaucoma; EBMD epithelial/anterior basement membrane dystrophy; ACIOL anterior chamber intraocular lens; IOL intraocular lens; PCIOL posterior chamber intraocular lens; Phaco/IOL phacoemulsification with intraocular lens placement; [place] photorefractive keratectomy; LASIK laser assisted in situ keratomileusis; HTN hypertension; DM diabetes mellitus; COPD chronic obstructive pulmonary disease

## 2022-06-17 ENCOUNTER — Ambulatory Visit (INDEPENDENT_AMBULATORY_CARE_PROVIDER_SITE_OTHER): Payer: Medicare Other | Admitting: Ophthalmology

## 2022-06-17 ENCOUNTER — Encounter (INDEPENDENT_AMBULATORY_CARE_PROVIDER_SITE_OTHER): Payer: Self-pay | Admitting: Ophthalmology

## 2022-06-17 DIAGNOSIS — Z961 Presence of intraocular lens: Secondary | ICD-10-CM

## 2022-06-17 DIAGNOSIS — H209 Unspecified iridocyclitis: Secondary | ICD-10-CM | POA: Diagnosis not present

## 2022-06-17 DIAGNOSIS — H4042X0 Glaucoma secondary to eye inflammation, left eye, stage unspecified: Secondary | ICD-10-CM | POA: Diagnosis not present

## 2022-06-17 DIAGNOSIS — T85398A Other mechanical complication of other ocular prosthetic devices, implants and grafts, initial encounter: Secondary | ICD-10-CM

## 2022-06-17 DIAGNOSIS — H27122 Anterior dislocation of lens, left eye: Secondary | ICD-10-CM

## 2022-06-17 DIAGNOSIS — H401321 Pigmentary glaucoma, left eye, mild stage: Secondary | ICD-10-CM

## 2022-06-17 DIAGNOSIS — H35352 Cystoid macular degeneration, left eye: Secondary | ICD-10-CM | POA: Diagnosis not present

## 2022-06-17 DIAGNOSIS — H57819 Brow ptosis, unspecified: Secondary | ICD-10-CM

## 2022-06-17 DIAGNOSIS — A6920 Lyme disease, unspecified: Secondary | ICD-10-CM

## 2022-06-17 DIAGNOSIS — H02831 Dermatochalasis of right upper eyelid: Secondary | ICD-10-CM

## 2022-06-17 MED ORDER — PROLENSA 0.07 % OP SOLN: 1.0000 [drp] | Freq: Four times a day (QID) | OPHTHALMIC | 2 refills | Status: DC

## 2022-06-17 MED ORDER — PREDNISOLONE ACETATE 1 % OP SUSP: 1.0000 [drp] | Freq: Four times a day (QID) | OPHTHALMIC | 2 refills | Status: DC

## 2022-06-18 DIAGNOSIS — M5440 Lumbago with sciatica, unspecified side: Secondary | ICD-10-CM | POA: Diagnosis not present

## 2022-06-22 DIAGNOSIS — M6281 Muscle weakness (generalized): Secondary | ICD-10-CM | POA: Diagnosis not present

## 2022-06-22 DIAGNOSIS — M2569 Stiffness of other specified joint, not elsewhere classified: Secondary | ICD-10-CM | POA: Diagnosis not present

## 2022-06-22 DIAGNOSIS — R2689 Other abnormalities of gait and mobility: Secondary | ICD-10-CM | POA: Diagnosis not present

## 2022-06-22 DIAGNOSIS — M5440 Lumbago with sciatica, unspecified side: Secondary | ICD-10-CM | POA: Diagnosis not present

## 2022-06-22 DIAGNOSIS — R293 Abnormal posture: Secondary | ICD-10-CM | POA: Diagnosis not present

## 2022-06-22 DIAGNOSIS — R29898 Other symptoms and signs involving the musculoskeletal system: Secondary | ICD-10-CM | POA: Diagnosis not present

## 2022-06-23 DIAGNOSIS — M109 Gout, unspecified: Secondary | ICD-10-CM | POA: Diagnosis not present

## 2022-06-23 DIAGNOSIS — R3912 Poor urinary stream: Secondary | ICD-10-CM | POA: Diagnosis not present

## 2022-06-25 DIAGNOSIS — M6281 Muscle weakness (generalized): Secondary | ICD-10-CM | POA: Diagnosis not present

## 2022-06-25 DIAGNOSIS — M5440 Lumbago with sciatica, unspecified side: Secondary | ICD-10-CM | POA: Diagnosis not present

## 2022-06-25 DIAGNOSIS — M2569 Stiffness of other specified joint, not elsewhere classified: Secondary | ICD-10-CM | POA: Diagnosis not present

## 2022-06-25 DIAGNOSIS — R2689 Other abnormalities of gait and mobility: Secondary | ICD-10-CM | POA: Diagnosis not present

## 2022-06-25 DIAGNOSIS — R293 Abnormal posture: Secondary | ICD-10-CM | POA: Diagnosis not present

## 2022-06-25 DIAGNOSIS — R29898 Other symptoms and signs involving the musculoskeletal system: Secondary | ICD-10-CM | POA: Diagnosis not present

## 2022-06-27 DIAGNOSIS — E1151 Type 2 diabetes mellitus with diabetic peripheral angiopathy without gangrene: Secondary | ICD-10-CM | POA: Diagnosis not present

## 2022-06-27 DIAGNOSIS — I209 Angina pectoris, unspecified: Secondary | ICD-10-CM | POA: Diagnosis not present

## 2022-06-27 DIAGNOSIS — E039 Hypothyroidism, unspecified: Secondary | ICD-10-CM | POA: Diagnosis not present

## 2022-06-27 DIAGNOSIS — I25119 Atherosclerotic heart disease of native coronary artery with unspecified angina pectoris: Secondary | ICD-10-CM | POA: Diagnosis not present

## 2022-06-27 DIAGNOSIS — I10 Essential (primary) hypertension: Secondary | ICD-10-CM | POA: Diagnosis not present

## 2022-06-27 DIAGNOSIS — E876 Hypokalemia: Secondary | ICD-10-CM | POA: Diagnosis not present

## 2022-06-27 DIAGNOSIS — I5033 Acute on chronic diastolic (congestive) heart failure: Secondary | ICD-10-CM | POA: Diagnosis not present

## 2022-06-27 DIAGNOSIS — R079 Chest pain, unspecified: Secondary | ICD-10-CM | POA: Diagnosis not present

## 2022-07-02 DIAGNOSIS — R2689 Other abnormalities of gait and mobility: Secondary | ICD-10-CM | POA: Diagnosis not present

## 2022-07-02 DIAGNOSIS — M2569 Stiffness of other specified joint, not elsewhere classified: Secondary | ICD-10-CM | POA: Diagnosis not present

## 2022-07-02 DIAGNOSIS — M5440 Lumbago with sciatica, unspecified side: Secondary | ICD-10-CM | POA: Diagnosis not present

## 2022-07-02 DIAGNOSIS — R29898 Other symptoms and signs involving the musculoskeletal system: Secondary | ICD-10-CM | POA: Diagnosis not present

## 2022-07-02 DIAGNOSIS — M6281 Muscle weakness (generalized): Secondary | ICD-10-CM | POA: Diagnosis not present

## 2022-07-02 DIAGNOSIS — R293 Abnormal posture: Secondary | ICD-10-CM | POA: Diagnosis not present

## 2022-07-09 DIAGNOSIS — M6281 Muscle weakness (generalized): Secondary | ICD-10-CM | POA: Diagnosis not present

## 2022-07-09 DIAGNOSIS — R2689 Other abnormalities of gait and mobility: Secondary | ICD-10-CM | POA: Diagnosis not present

## 2022-07-09 DIAGNOSIS — M5440 Lumbago with sciatica, unspecified side: Secondary | ICD-10-CM | POA: Diagnosis not present

## 2022-07-09 DIAGNOSIS — M2569 Stiffness of other specified joint, not elsewhere classified: Secondary | ICD-10-CM | POA: Diagnosis not present

## 2022-07-09 DIAGNOSIS — R29898 Other symptoms and signs involving the musculoskeletal system: Secondary | ICD-10-CM | POA: Diagnosis not present

## 2022-07-09 DIAGNOSIS — R293 Abnormal posture: Secondary | ICD-10-CM | POA: Diagnosis not present

## 2022-07-09 NOTE — Progress Notes (Signed)
[facility] Note  07/15/2022     CHIEF COMPLAINT Patient presents for Retina Follow Up  HISTORY OF PRESENT ILLNESS: Christopher Randolph is a 76 y.o. male who presents to the clinic today for:   HPI     Retina Follow Up   Patient presents with  Other.  In left eye.  This started 4 weeks ago.  I, the attending physician,  performed the HPI with the patient and updated documentation appropriately.        Comments   Patient here for 4 weeks retina follow up for CME OS. Patient states vision doing ok. No eye pain.       Last edited by Christopher Caffey, MD on 07/16/2022  1:20 AM.    Pt states left eye vision is improved, he states it stays red almost all the time   Referring physician: Curlene Labrum, MD [place],   [id number]  HISTORICAL INFORMATION:   Selected notes from the MEDICAL RECORD NUMBER Referred by Dr. Vicie Randolph for concern of vitreous hemorrhage   CURRENT MEDICATIONS: Current Outpatient Medications (Ophthalmic Drugs)  Medication Sig   Bromfenac Sodium (PROLENSA) 0.07 % SOLN Apply 1 drop to eye 4 (four) times daily.   cycloSPORINE (RESTASIS) 0.05 % ophthalmic emulsion Place 1 drop into both eyes daily.   latanoprost (XALATAN) 0.005 % ophthalmic solution Place 1 drop into the left eye at bedtime.   prednisoLONE acetate (PRED FORTE) 1 % ophthalmic suspension Place 1 drop into the left eye 4 (four) times daily.   No current facility-administered medications for this visit. (Ophthalmic Drugs)   Current Outpatient Medications (Other)  Medication Sig   acetaminophen (TYLENOL) 325 MG tablet Take 650 mg by mouth every 6 (six) hours as needed.   aspirin EC 81 MG tablet Take 81 mg by mouth daily. Swallow whole.   atorvastatin (LIPITOR) 20 MG tablet TAKE 1 TABLET BY MOUTH DAILY   Budeson-Glycopyrrol-Formoterol (BREZTRI AEROSPHERE) 160-9-4.8 MCG/ACT AERO Inhale 2 puffs into the lungs in the morning and at bedtime.   clopidogrel  (PLAVIX) 75 MG tablet Take 1 tablet (75 mg total) by mouth daily.   fluticasone (FLONASE) 50 MCG/ACT nasal spray Place 2 sprays into both nostrils daily.   furosemide (LASIX) 20 MG tablet Take 1 tablet (20 mg total) by mouth daily.   levothyroxine (SYNTHROID, LEVOTHROID) 150 MCG tablet Take 150 mcg by mouth See admin instructions. Alternates taking 150 mg daily for 2 days then 175 mcg the third day then repeat   lisinopril (ZESTRIL) 2.5 MG tablet Take 1 tablet (2.5 mg total) by mouth daily. (Patient taking differently: Take 5 mg by mouth daily.)   metFORMIN (GLUCOPHAGE) 500 MG tablet Take 500 mg by mouth daily.   mometasone (ELOCON) 0.1 % ointment Apply 1 application. topically at bedtime.   nitroGLYCERIN (NITROSTAT) 0.4 MG SL tablet PLACE 1 TABLET UNDER THE TONGUE EVERY 5 MINUTES FOR 3 DOSES AS NEEDED CHEST PAIN   oxyCODONE-acetaminophen (PERCOCET) 5-325 MG tablet Take 1 tablet by mouth every 4 (four) hours as needed for severe pain.   tamsulosin (FLOMAX) 0.4 MG CAPS capsule Take 0.8 mg by mouth at bedtime.   No current facility-administered medications for this visit. (Other)   REVIEW OF SYSTEMS: ROS   Positive for: Musculoskeletal, Cardiovascular, Eyes Negative for: Constitutional, Gastrointestinal, Neurological, Skin, Genitourinary, HENT, Endocrine, Respiratory, Psychiatric, Allergic/Imm, Heme/Lymph Last edited by Christopher Randolph, COA on 07/15/2022  8:59 AM.     Christopher Randolph  Allergies  Allergen Reactions   Erythromycin Swelling    swelling of lips   Shellfish Allergy Rash and Other (See Comments)    Gout flares   Azithromycin Swelling   Nitrofuran Derivatives    PAST MEDICAL HISTORY Past Medical History:  Diagnosis Date   Arthritis    Brain TIA 02/26/2022   CAD (coronary artery disease)    Branch vessel and moderate RCA disease 2006   Cancer ([place]) 1990   Melanoma Lower right Leg   Carotid artery disease ([place])    Complication of anesthesia 2020   blood presure dropped when   started to put payient , blood preesure.   COPD (chronic obstructive pulmonary disease) (HCC)    Essential hypertension    Glaucoma    Hyperlipidemia    Hypothyroidism    Lyme disease    PAD (peripheral artery disease) (HCC)    Left common iliac stent 2004   Pneumonia    TIA (transient ischemic attack) 02/2022   Type 2 diabetes mellitus ([place])    Wears dentures    Wears glasses    Past Surgical History:  Procedure Laterality Date   CATARACT EXTRACTION Bilateral    CATARACT EXTRACTION, BILATERAL     CERVICAL DISC SURGERY     x 1   COLONOSCOPY N/A 11/06/2016   Procedure: COLONOSCOPY;  Surgeon: Christopher Dolin, MD;  [facility];  Service: Endoscopy;  Laterality: N/A;  7:30 AM   COLONOSCOPY  2012   Dr. Anthony Randolph: normal. reviewed reports, which states he has a history of polyps in remote past.    ELBOW SURGERY Left    EYE SURGERY Bilateral    Cat Sx   EYE SURGERY Left 01/14/2021   [facility] - Dr. Jovita Randolph   IRIDOTOMY / IRIDECTOMY Left 01/14/2021   [facility] - Dr. Jovita Randolph   JOINT REPLACEMENT     LUMBAR WOUND DEBRIDEMENT N/A 01/24/2019   Procedure: LUMBAR WOUND Exploration for Evacation of Seroma vs. Hematoma;  Surgeon: Christopher Gamma, MD;  Location: [facility];  Service: Neurosurgery;  Laterality: N/A;   MELANOMA EXCISION     right leg, seen at [facility] and underwent immunotherapy   NECK SURGERY      x 1 yrs ago   PARS PLANA VITRECTOMY Left 04/04/2020   Procedure: PARS PLANA VITRECTOMY WITH 25G REMOVAL/SUTURE INTRAOCULAR LENS;  Surgeon: Christopher Caffey, MD;  Location: [facility];  Service: Ophthalmology;  Laterality: Left;   REPLACEMENT TOTAL KNEE Right    TRANSCAROTID ARTERY REVASCULARIZATION  Right 03/25/2022   Procedure: Right Transcarotid Artery Revascularization;  Surgeon: Christopher Mitchell, MD;  Location: [facility] OR;  Service: Vascular;  Laterality: Right;   WISDOM TOOTH EXTRACTION     FAMILY HISTORY Family History  Problem Relation Age of Onset   Heart disease Mother    Heart  attack Mother    Colon polyps Mother    Colon polyps Brother    Heart attack Maternal Grandmother    Heart attack Maternal Grandfather    Colon cancer Neg Hx    SOCIAL HISTORY Social History   Tobacco Use   Smoking status: Former    Packs/day: 1.50    Years: 40.00    Total pack years: 60.00    Types: Cigarettes    Start date: 11/04/1963    Quit date: 10/29/2003    Years since quitting: 18.7    Passive exposure: Never   Smokeless tobacco: Never  Vaping Use   Vaping Use: Never used  Substance Use  Topics   Alcohol use: Yes    Alcohol/week: 15.0 standard drinks of alcohol    Types: 14 Cans of beer, 1 Standard drinks or equivalent per week    Comment: 2 beers or a mixed drink   Drug use: No       OPHTHALMIC EXAM:  Base Eye Exam     Visual Acuity (Snellen - Linear)       Right Left   Dist cc 20/15 20/30   Dist ph cc  NI    Correction: Glasses         Tonometry (Tonopen, 8:57 AM)       Right Left   Pressure 15 17         Pupils       Dark Light Shape React APD   Right 3 2 Round Brisk None   Left 3 2 Round Brisk None         Visual Fields (Counting fingers)       Left Right    Full Full         Extraocular Movement       Right Left    Full, Ortho Full, Ortho         Neuro/Psych     Oriented x3: Yes   Mood/Affect: Normal         Dilation     Both eyes: 1.0% Mydriacyl, 2.5% Phenylephrine @ 8:57 AM           Slit Lamp and Fundus Exam     External Exam       Right Left   External brow ptosis brow ptosis; mild periorbital edema and erythema         Slit Lamp Exam       Right Left   Lids/Lashes Dermatochalasis - upper lid, Telangiectasia, mild Meibomian gland dysfunction Dermatochalasis, Telangiectasia, Meibomian gland dysfunction, Ptosis   Conjunctiva/Sclera Nasal and temporal Pinguecula temporal Pinguecula, ahmed plate and scleral patch graft ST quad -- nicely healed, bleb ST conj   Cornea well healed cataract wound,  mild tear film debris well healed cataract wound   Anterior Chamber Deep and quiet, no cell/flare Deep, tube at 0100, No cell or pigment   Iris No NVI, Round and moderately dilated No NVI, moderately dilated to 4.26m, Transillumination defects from 0100-0200   Lens Posterior chamber intraocular lens in good position sutured Akreos IOL in good position   Anterior Vitreous vitreous syneresis post vitrectomy, clear         Fundus Exam       Right Left   Disc Pink and Sharp Pink and Sharp, Compact, mild temporal PPA   C/D Ratio 0.2 0.2   Macula Flat, good foveal reflex, mild RPE mottling, No heme or edema Flat. blunted foveal reflex, central cystic changes - improved, mild RPE mottling, No heme   Vessels normal, mildly Tortuous attenuated, Tortuous   Periphery Attached, round focal area of CR atrophy at 0630 mid zone Attached, No heme           Refraction     Wearing Rx       Sphere Cylinder Axis   Right +0.50 +1.00 178   Left -2.75 +2.50 175            IMAGING AND PROCEDURES  Imaging and Procedures for '@TODAY'$ @  OCT, Retina - OU - Both Eyes       Right Eye Quality was good. Central Foveal Thickness: 273. Progression has been stable. Findings include  normal foveal contour, no IRF, no SRF (partial PVD - stable).   Left Eye Quality was good. Central Foveal Thickness: 301. Progression has improved. Findings include normal foveal contour, no SRF, intraretinal fluid (Interval improvement / resolution of central cystic changes / CME).   Notes *Images captured and stored on drive  Diagnosis / Impression:  OD: NFP, no IRF, no SRF; partial PVD OS: Interval improvement / resolution of central cystic changes / CME  Clinical management:  See below  Abbreviations: NFP - Normal foveal profile. CME - cystoid macular edema. PED - pigment epithelial detachment. IRF - intraretinal fluid. SRF - subretinal fluid. EZ - ellipsoid zone. ERM - epiretinal membrane. ORA - outer retinal  atrophy. ORT - outer retinal tubulation. SRHM - subretinal hyper-reflective material             ASSESSMENT/PLAN:    ICD-10-CM   1. Uveitis-hyphema-glaucoma syndrome of left eye  T85.398A OCT, Retina - OU - Both Eyes   H20.9    H40.42X0     2. 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 Pigmentary glaucoma of left eye, mild stage  H40.1321     4. Dislocated IOL (intraocular lens), anterior, left  H27.122     5. Cystoid macular edema of left eye  H35.352 OCT, Retina - OU - Both Eyes    6. Pseudophakia of both eyes  Z96.1     7. Lyme disease  A69.20     8. Dermatochalasis of both upper eyelids  H02.831    H02.834     9. Brow ptosis  H57.819       1-4. UGH Syndrome OS  - originally: 3 piece PCIOL OS centered, but iris has large TID in sup temp quadrant with IOL haptic within area  - tmax at [facility] 39 OS and was started on max drops (Cosopt, Brim, latan, and po diamox)  - repeat gonio showed open angles OS, mild focal PAS  - s/p PPV w/ IOL exchange / sutured secondary Akreos OS IOL 04.08.21             - today, doing well, BCVA OS 20/30  - OCT shows interval improvement in central IRF -- recurrent CME resolved             - IOP 17 OS  - s/p Ahmed Valve OS on 1.18.22 ([place])  - now released from Dr. Duayne Cal care  - monitor  5. CME OS -- recurrent  - mild CME that was improved on 7.21.21  - interval re-development of CME  06.21.23 (prior recurrences 08.18.21 and 12.16.22)  - s/p STK OS (08.18.21)  - OCT today (07.19.23) shows interval improvement / resolution of central cystic changes / CME  - BCVA improved to 20/30 from 20/40  - decrease PF and Prolensa to BID OS  - f/u 6-8 weeks, DFE, OCT  6. Pseudophakia OU  - s/p CE/IOL OU -- pt can't remember dates, one eye by [facility], other eye by [facility]  - h/o UGH OS and now sutured IOL as above  - PCIOL OD appears to be in good position  - sutured Akreos IOL OS in good position  - monitor   7. Lyme disease  -  history of diagnosis from target lesions on lower extremities  - no blood test confirmation performed  - completed doxycycline per PCP  - no manifestations in the eye, but will continue to monitor   8,9. Brow ptosis, dermatochalasis OU  -  referred to Dr. Vickki Muff (now at [facility]) for eyelid eval  - holding on repair for now  Ophthalmic Meds Ordered this visit:  No orders of the defined types were placed in this encounter.    Return for f/u 6-8 weeks, CME OS, DFE, OCT.  There are no Patient Instructions on file for this visit.   This document serves as a record of services personally performed by Gardiner Sleeper, MD, PhD. It was created on their behalf by Orvan Falconer, an ophthalmic technician. The creation of this record is the provider's dictation and/or activities during the visit.    Electronically signed by: Orvan Falconer, OA, 07/16/22  1:21 AM  This document serves as a record of services personally performed by Gardiner Sleeper, MD, PhD. It was created on their behalf by San Jetty. Owens Shark, OA an ophthalmic technician. The creation of this record is the provider's dictation and/or activities during the visit.    Electronically signed by: San Jetty. Owens Shark, [place] 07.19.2023 1:21 AM   Gardiner Sleeper, M.D., Ph.D. Diseases & Surgery of the Retina and Vitreous [facility]  I have reviewed the above documentation for accuracy and completeness, and I agree with the above. Gardiner Sleeper, M.D., Ph.D. 07/16/22 1:24 AM   Abbreviations: M myopia (nearsighted); A astigmatism; H hyperopia (farsighted); P presbyopia; Mrx spectacle prescription;  CTL contact lenses; OD right eye; OS left eye; OU both eyes  XT exotropia; ET esotropia; PEK punctate epithelial keratitis; PEE punctate epithelial erosions; DES dry eye syndrome; MGD meibomian gland dysfunction; ATs artificial tears; PFAT's preservative free artificial tears; [place] nuclear sclerotic cataract; PSC posterior  subcapsular cataract; ERM epi-retinal membrane; PVD posterior vitreous detachment; RD retinal detachment; DM diabetes mellitus; DR diabetic retinopathy; NPDR non-proliferative diabetic retinopathy; PDR proliferative diabetic retinopathy; CSME clinically significant macular edema; DME diabetic macular edema; dbh dot blot hemorrhages; CWS cotton wool spot; POAG primary open angle glaucoma; C/D cup-to-disc ratio; HVF humphrey visual field; GVF goldmann visual field; OCT optical coherence tomography; IOP intraocular pressure; BRVO Branch retinal vein occlusion; CRVO central retinal vein occlusion; CRAO central retinal artery occlusion; BRAO branch retinal artery occlusion; RT retinal tear; SB scleral buckle; PPV pars plana vitrectomy; VH Vitreous hemorrhage; PRP panretinal laser photocoagulation; IVK intravitreal kenalog; VMT vitreomacular traction; MH Macular hole;  NVD neovascularization of the disc; NVE neovascularization elsewhere; AREDS age related eye disease study; ARMD age related macular degeneration; POAG primary open angle glaucoma; EBMD epithelial/anterior basement membrane dystrophy; ACIOL anterior chamber intraocular lens; IOL intraocular lens; PCIOL posterior chamber intraocular lens; Phaco/IOL phacoemulsification with intraocular lens placement; [place] photorefractive keratectomy; LASIK laser assisted in situ keratomileusis; HTN hypertension; DM diabetes mellitus; COPD chronic obstructive pulmonary disease

## 2022-07-10 IMAGING — DX DG CHEST 2V
2 series · 2 of 2 positions shown · non-contrast
Comparison: 02/27/2022

CLINICAL DATA: Chest pain, shortness of breath

EXAM:
CHEST - 2 VIEW

[chest pa]
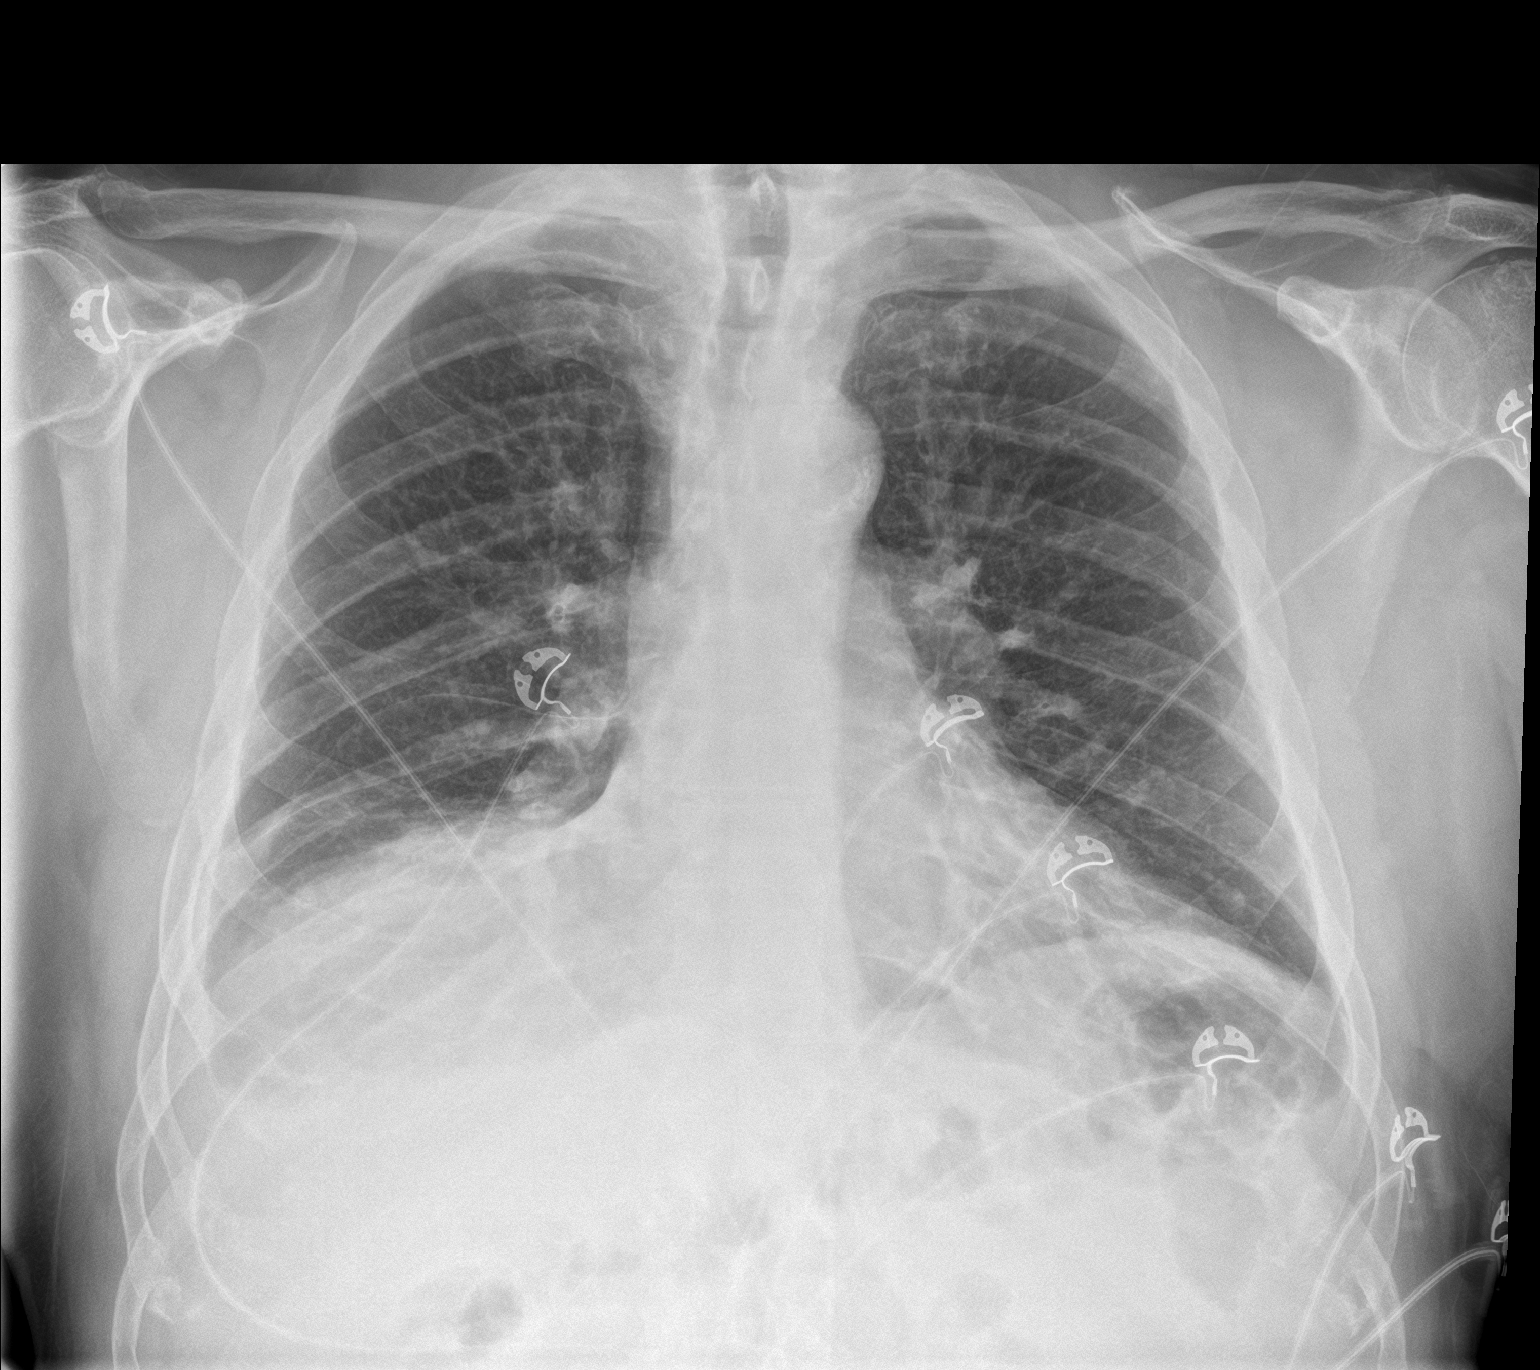

[chest lat]
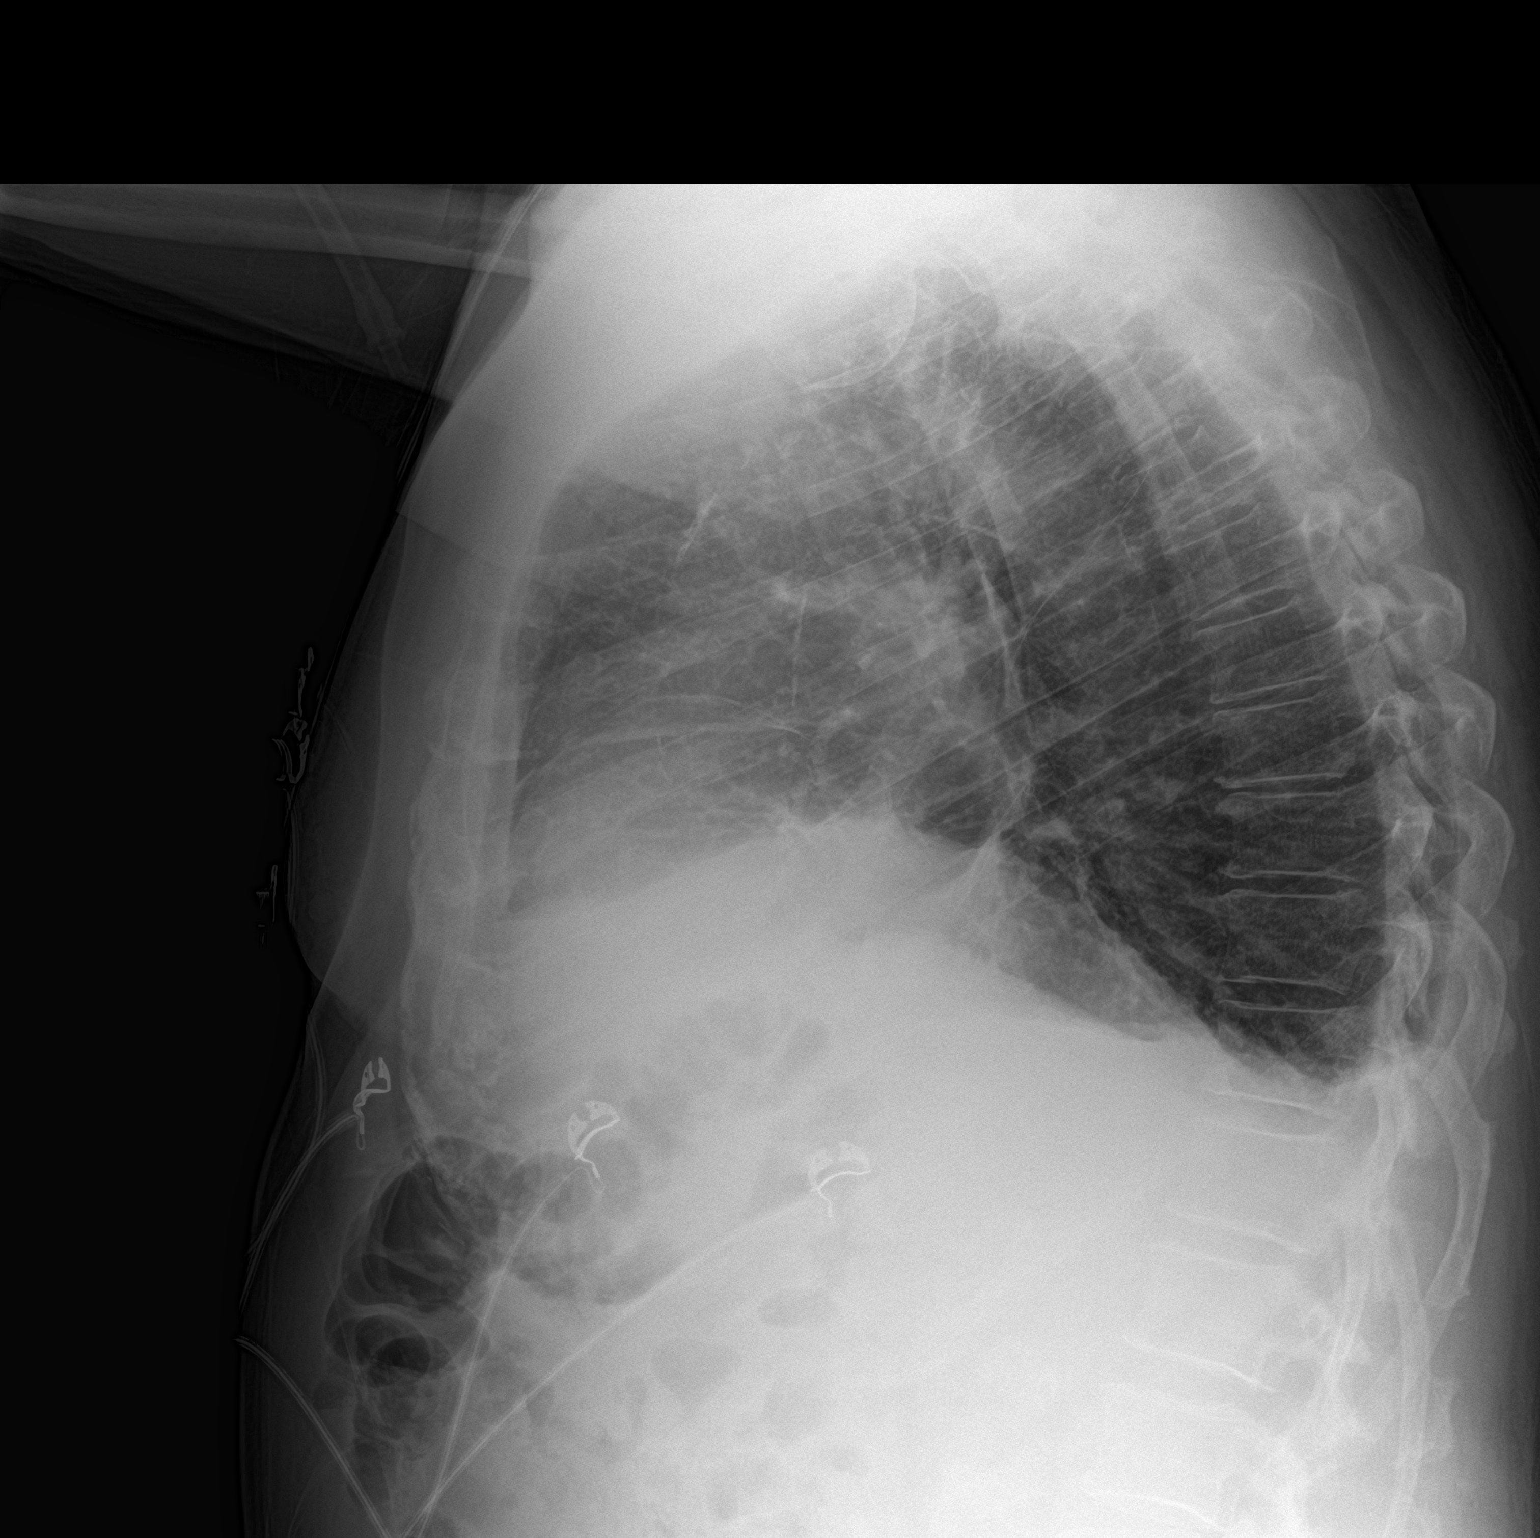

[2 of 2 positions shown; findings below may reference images not displayed]

FINDINGS: Cardiac size is within normal limits. There are no signs of
pulmonary edema. There are linear densities in the lower lung fields
with interval worsening on the left side. There is minimal blunting
of right lateral CP angle. There is no pneumothorax.
IMPRESSION: There are linear densities in both lower lung fields suggesting
subsegmental atelectasis/pneumonia. Small right pleural effusion.

## 2022-07-13 DIAGNOSIS — R2689 Other abnormalities of gait and mobility: Secondary | ICD-10-CM | POA: Diagnosis not present

## 2022-07-13 DIAGNOSIS — M2569 Stiffness of other specified joint, not elsewhere classified: Secondary | ICD-10-CM | POA: Diagnosis not present

## 2022-07-13 DIAGNOSIS — R29898 Other symptoms and signs involving the musculoskeletal system: Secondary | ICD-10-CM | POA: Diagnosis not present

## 2022-07-13 DIAGNOSIS — M6281 Muscle weakness (generalized): Secondary | ICD-10-CM | POA: Diagnosis not present

## 2022-07-13 DIAGNOSIS — R293 Abnormal posture: Secondary | ICD-10-CM | POA: Diagnosis not present

## 2022-07-13 DIAGNOSIS — M5440 Lumbago with sciatica, unspecified side: Secondary | ICD-10-CM | POA: Diagnosis not present

## 2022-07-15 ENCOUNTER — Ambulatory Visit (INDEPENDENT_AMBULATORY_CARE_PROVIDER_SITE_OTHER): Payer: Medicare Other | Admitting: Ophthalmology

## 2022-07-15 ENCOUNTER — Encounter (INDEPENDENT_AMBULATORY_CARE_PROVIDER_SITE_OTHER): Payer: Self-pay | Admitting: Ophthalmology

## 2022-07-15 DIAGNOSIS — H401321 Pigmentary glaucoma, left eye, mild stage: Secondary | ICD-10-CM | POA: Diagnosis not present

## 2022-07-15 DIAGNOSIS — H02834 Dermatochalasis of left upper eyelid: Secondary | ICD-10-CM

## 2022-07-15 DIAGNOSIS — H57819 Brow ptosis, unspecified: Secondary | ICD-10-CM | POA: Diagnosis not present

## 2022-07-15 DIAGNOSIS — H02831 Dermatochalasis of right upper eyelid: Secondary | ICD-10-CM | POA: Diagnosis not present

## 2022-07-15 DIAGNOSIS — Z961 Presence of intraocular lens: Secondary | ICD-10-CM

## 2022-07-15 DIAGNOSIS — H4042X0 Glaucoma secondary to eye inflammation, left eye, stage unspecified: Secondary | ICD-10-CM

## 2022-07-15 DIAGNOSIS — A6920 Lyme disease, unspecified: Secondary | ICD-10-CM

## 2022-07-15 DIAGNOSIS — H35352 Cystoid macular degeneration, left eye: Secondary | ICD-10-CM

## 2022-07-15 DIAGNOSIS — H209 Unspecified iridocyclitis: Secondary | ICD-10-CM | POA: Diagnosis not present

## 2022-07-15 DIAGNOSIS — H27122 Anterior dislocation of lens, left eye: Secondary | ICD-10-CM | POA: Diagnosis not present

## 2022-07-16 ENCOUNTER — Encounter (INDEPENDENT_AMBULATORY_CARE_PROVIDER_SITE_OTHER): Payer: Self-pay | Admitting: Ophthalmology

## 2022-07-16 DIAGNOSIS — M6281 Muscle weakness (generalized): Secondary | ICD-10-CM | POA: Diagnosis not present

## 2022-07-16 DIAGNOSIS — M2569 Stiffness of other specified joint, not elsewhere classified: Secondary | ICD-10-CM | POA: Diagnosis not present

## 2022-07-16 DIAGNOSIS — R29898 Other symptoms and signs involving the musculoskeletal system: Secondary | ICD-10-CM | POA: Diagnosis not present

## 2022-07-16 DIAGNOSIS — M5440 Lumbago with sciatica, unspecified side: Secondary | ICD-10-CM | POA: Diagnosis not present

## 2022-07-16 DIAGNOSIS — R293 Abnormal posture: Secondary | ICD-10-CM | POA: Diagnosis not present

## 2022-07-16 DIAGNOSIS — R2689 Other abnormalities of gait and mobility: Secondary | ICD-10-CM | POA: Diagnosis not present

## 2022-07-20 ENCOUNTER — Encounter: Payer: Self-pay | Admitting: Pulmonary Disease

## 2022-07-20 ENCOUNTER — Ambulatory Visit: Payer: Medicare Other | Admitting: Pulmonary Disease

## 2022-07-20 DIAGNOSIS — J449 Chronic obstructive pulmonary disease, unspecified: Secondary | ICD-10-CM

## 2022-07-20 DIAGNOSIS — J9611 Chronic respiratory failure with hypoxia: Secondary | ICD-10-CM | POA: Diagnosis not present

## 2022-07-20 NOTE — Progress Notes (Signed)
   Subjective:    Patient ID: Franchot Mimes, male    DOB: Apr 06, 1946, 76 y.o.   MRN: [id number]  HPI  76 yo  heavy ex-smoker from [facility] for FU of COPD and chronic hypoxic respiratory failure.   He smoked for more than 80 pack years before he quit in 2004   [place] -CAD status post stent 2004 Hypertension, diabetes type 2 TIA 02/2022 amaurosis fugax ,right carotid stent  Initial OV 04/2022 >> adm x 2 , second admission was for acute diastolic heart failure and fluid retention,  improved with diuresis but hypoxia persisted. He was able to stay off oxygen for short periods at home, lowest desat was 88% after ambulation  Chief Complaint  Patient presents with   Follow-up    Breathing has been doing well.    Last office visit, he was given Breztri.  He took this a few times and then stopped taking.  Albuterol made him jittery in the past. He states breathing is doing well.  He has not required oxygen since his last visit and after a few days he returned the oxygen. He has been tracking his weight and cardiac per meters and is compliant with Lasix.  He has 5 acres of land and he stays active We reviewed PFTs today  Significant tests/ events reviewed    PFTs 04/2022 showed moderate airway obstruction with ratio 70, FEV1 62%, FVC 64 %, postbronchodilator FEV1 67%, DLCO 70% CT angiogram chest 03/2022 right lower lobe atelectasis/effusion    Review of Systems neg for any significant sore throat, dysphagia, itching, sneezing, nasal congestion or excess/ purulent secretions, fever, chills, sweats, unintended wt loss, pleuritic or exertional cp, hempoptysis, orthopnea pnd or change in chronic leg swelling. Also denies presyncope, palpitations, heartburn, abdominal pain, nausea, vomiting, diarrhea or change in bowel or urinary habits, dysuria,hematuria, rash, arthralgias, visual complaints, headache, numbness weakness or ataxia.     Objective:   Physical Exam   Gen. Pleasant, well-nourished,  elderly, in no distress ENT - no thrush, no pallor/icterus,no post nasal drip Neck: No JVD, no thyromegaly, no carotid bruits Lungs: no use of accessory muscles, no dullness to percussion, clear without rales or rhonchi  Cardiovascular: Rhythm regular, heart sounds  normal, no murmurs or gallops, no peripheral edema Musculoskeletal: No deformities, no cyanosis or clubbing         Assessment & Plan:

## 2022-07-20 NOTE — Assessment & Plan Note (Signed)
-   Resolved He has returned the oxygen

## 2022-07-20 NOTE — Assessment & Plan Note (Signed)
We reviewed PFTs that show moderate airway obstruction.  He has self discontinued Breztri.  Albuterol made him jittery. I offered him a LAMA/LABA combination but he would rather hold off.  I offered him pulmonary rehab program but he feels that he stays active and we would prefer home-based program  We discussed signs and symptoms of COPD exacerbation. We discussed COPD action plan

## 2022-07-20 NOTE — Patient Instructions (Signed)
Please stay active

## 2022-07-21 NOTE — Addendum Note (Signed)
Addended by: Fritzi Mandes D on: 07/21/2022 03:29 PM   Modules accepted: Orders

## 2022-07-24 ENCOUNTER — Telehealth: Payer: Self-pay

## 2022-07-24 NOTE — Telephone Encounter (Signed)
Call to pt reference PREP referral Patient feels he is currently getting enough exercise caring for property (mowing, weed eating etc) Does not feel he would benefit from the lifestyle coaching because he has already made changes after diagnoses of CHF, Stroke-sts does not use salt, or sugar.  Declines PREP at this time Encouraged to call should he change his mind.

## 2022-07-27 DIAGNOSIS — I10 Essential (primary) hypertension: Secondary | ICD-10-CM | POA: Diagnosis not present

## 2022-07-27 DIAGNOSIS — Z1159 Encounter for screening for other viral diseases: Secondary | ICD-10-CM | POA: Diagnosis not present

## 2022-07-27 DIAGNOSIS — R5383 Other fatigue: Secondary | ICD-10-CM | POA: Diagnosis not present

## 2022-07-27 DIAGNOSIS — E7849 Other hyperlipidemia: Secondary | ICD-10-CM | POA: Diagnosis not present

## 2022-07-27 DIAGNOSIS — E876 Hypokalemia: Secondary | ICD-10-CM | POA: Diagnosis not present

## 2022-07-27 DIAGNOSIS — E039 Hypothyroidism, unspecified: Secondary | ICD-10-CM | POA: Diagnosis not present

## 2022-07-27 DIAGNOSIS — E1169 Type 2 diabetes mellitus with other specified complication: Secondary | ICD-10-CM | POA: Diagnosis not present

## 2022-07-30 DIAGNOSIS — M5440 Lumbago with sciatica, unspecified side: Secondary | ICD-10-CM | POA: Diagnosis not present

## 2022-08-03 DIAGNOSIS — E039 Hypothyroidism, unspecified: Secondary | ICD-10-CM | POA: Diagnosis not present

## 2022-08-03 DIAGNOSIS — E876 Hypokalemia: Secondary | ICD-10-CM | POA: Diagnosis not present

## 2022-08-03 DIAGNOSIS — I10 Essential (primary) hypertension: Secondary | ICD-10-CM | POA: Diagnosis not present

## 2022-08-03 DIAGNOSIS — D696 Thrombocytopenia, unspecified: Secondary | ICD-10-CM | POA: Diagnosis not present

## 2022-08-03 DIAGNOSIS — I25119 Atherosclerotic heart disease of native coronary artery with unspecified angina pectoris: Secondary | ICD-10-CM | POA: Diagnosis not present

## 2022-08-03 DIAGNOSIS — E1151 Type 2 diabetes mellitus with diabetic peripheral angiopathy without gangrene: Secondary | ICD-10-CM | POA: Diagnosis not present

## 2022-08-04 ENCOUNTER — Encounter: Payer: Self-pay | Admitting: Cardiology

## 2022-08-04 ENCOUNTER — Ambulatory Visit: Payer: Medicare Other | Admitting: Cardiology

## 2022-08-04 VITALS — BP 110/54 | HR 63 | Ht 67.5 in | Wt 175.4 lb

## 2022-08-04 DIAGNOSIS — I5032 Chronic diastolic (congestive) heart failure: Secondary | ICD-10-CM

## 2022-08-04 DIAGNOSIS — I25119 Atherosclerotic heart disease of native coronary artery with unspecified angina pectoris: Secondary | ICD-10-CM

## 2022-08-04 MED ORDER — DAPAGLIFLOZIN PROPANEDIOL 10 MG PO TABS: 10.0000 mg | ORAL_TABLET | Freq: Every day | ORAL | 6 refills | Status: DC

## 2022-08-04 NOTE — Progress Notes (Signed)
Cardiology Office Note  Date: 08/04/2022   ID: Christopher Randolph, DOB [birth year], MRN [id number]  PCP:  Curlene Labrum, MD  Cardiologist:  Rozann Lesches, MD Electrophysiologist:  None   Chief Complaint  Patient presents with   Cardiac follow-up    History of Present Illness: Christopher Randolph is a 76 y.o. male last seen in May by Ms. Bonnell Public PA-C.  He was also hospitalized in June with chest discomfort.  He was seen in consultation by Dr. Harl Bowie, ruled out for ACS, and underwent follow-up echocardiogram as well as [facility] as detailed below with recommendation for continued medical therapy.  He was treated for acute on chronic HFpEF with diuresis and significant weight loss.  Discharged on Lasix 20 mg daily which she has continued although his weight is creeping back up gradually.  I reviewed his cardiac testing and also present medications.  We discussed addition of SGLT2 inhibitor.  He otherwise does not report any recurring chest pain since discharge.  Remains functional with ADLs and yard work.  Past Medical History:  Diagnosis Date   Arthritis    CAD (coronary artery disease)    Branch vessel and moderate RCA disease 2006   Cancer [facility]) 1990   Melanoma Lower right Leg   Carotid artery disease (HCC)    COPD (chronic obstructive pulmonary disease) ([place])    Essential hypertension    Glaucoma    Hyperlipidemia    Hypothyroidism    Lyme disease    PAD (peripheral artery disease) ([place])    Left common iliac stent 2004   Pneumonia    TIA (transient ischemic attack) 02/2022   Type 2 diabetes mellitus ([place])    Wears dentures    Wears glasses     Past Surgical History:  Procedure Laterality Date   CATARACT EXTRACTION Bilateral    CATARACT EXTRACTION, BILATERAL     CERVICAL DISC SURGERY     x 1   COLONOSCOPY N/A 11/06/2016   Procedure: COLONOSCOPY;  Surgeon: Daneil Dolin, MD;  [facility]: [facility];  Service: Endoscopy;  Laterality: N/A;  7:30 AM    COLONOSCOPY  2012   Dr. Anthony Sar: normal. reviewed reports, which states he has a history of polyps in remote past.    ELBOW SURGERY Left    EYE SURGERY Bilateral    Cat Sx   EYE SURGERY Left 01/14/2021   [place] - Dr. Jovita Kussmaul   IRIDOTOMY / IRIDECTOMY Left 01/14/2021   [place] - Dr. Jovita Kussmaul   JOINT REPLACEMENT     LUMBAR WOUND DEBRIDEMENT N/A 01/24/2019   Procedure: LUMBAR WOUND Exploration for Evacation of Seroma vs. Hematoma;  Surgeon: Jovita Gamma, MD;  Location: [place];  Service: Neurosurgery;  Laterality: N/A;   MELANOMA EXCISION     right leg, seen at [facility] and underwent immunotherapy   NECK SURGERY      x 1 yrs ago   PARS PLANA VITRECTOMY Left 04/04/2020   Procedure: PARS PLANA VITRECTOMY WITH 25G REMOVAL/SUTURE INTRAOCULAR LENS;  Surgeon: [name], MD;  Location: [place];  Service: Ophthalmology;  Laterality: Left;   REPLACEMENT TOTAL KNEE Right    TRANSCAROTID ARTERY REVASCULARIZATION  Right 03/25/2022   Procedure: Right Transcarotid Artery Revascularization;  Surgeon: Serafina Mitchell, MD;  Location: [facility];  Service: Vascular;  Laterality: Right;   WISDOM TOOTH EXTRACTION      Current Outpatient Medications  Medication Sig Dispense Refill   acetaminophen (TYLENOL) 325 MG tablet Take 650 mg  by mouth every 6 (six) hours as needed.     aspirin EC 81 MG tablet Take 81 mg by mouth daily. Swallow whole.     atorvastatin (LIPITOR) 20 MG tablet TAKE 1 TABLET BY MOUTH DAILY (Patient taking differently: Take 20 mg by mouth daily.) 90 tablet 3   Bromfenac Sodium (PROLENSA) 0.07 % SOLN Apply 1 drop to eye in the morning and at bedtime.     clopidogrel (PLAVIX) 75 MG tablet Take 1 tablet (75 mg total) by mouth daily. 30 tablet 11   cycloSPORINE (RESTASIS) 0.05 % ophthalmic emulsion Place 1 drop into both eyes daily.     dapagliflozin propanediol (FARXIGA) 10 MG TABS tablet Take 1 tablet (10 mg total) by mouth daily before breakfast. Has 30 day free voucher 30 tablet 6    fluticasone (FLONASE) 50 MCG/ACT nasal spray Place 2 sprays into both nostrils daily.     furosemide (LASIX) 20 MG tablet Take 1 tablet (20 mg total) by mouth daily. 30 tablet 0   latanoprost (XALATAN) 0.005 % ophthalmic solution Place 1 drop into the left eye at bedtime.     levothyroxine (SYNTHROID, LEVOTHROID) 150 MCG tablet Take 150 mcg by mouth See admin instructions. Alternates taking 150 mg daily for 2 days then 175 mcg the third day then repeat  1   lisinopril (ZESTRIL) 5 MG tablet Take 5 mg by mouth daily.     metFORMIN (GLUCOPHAGE) 500 MG tablet Take 500 mg by mouth daily.     mometasone (ELOCON) 0.1 % ointment Apply 1 application. topically at bedtime.     oxyCODONE-acetaminophen (PERCOCET) 5-325 MG tablet Take 1 tablet by mouth every 4 (four) hours as needed for severe pain. 10 tablet 0   prednisoLONE acetate (PRED FORTE) 1 % ophthalmic suspension Place 1 drop into the left eye 2 (two) times daily.     tamsulosin (FLOMAX) 0.4 MG CAPS capsule Take 0.8 mg by mouth at bedtime.     No current facility-administered medications for this visit.   Allergies:  Erythromycin, Shellfish allergy, Azithromycin, and Nitrofuran derivatives   ROS: No palpitations or syncope.  Physical Exam: VS:  BP (!) 110/54   Pulse 63   Ht 5' 7.5" (1.715 m)   Wt 175 lb 6.4 oz (79.6 kg)   SpO2 95%   BMI 27.07 kg/m , BMI Body mass index is 27.07 kg/m.  Wt Readings from Last 3 Encounters:  08/04/22 175 lb 6.4 oz (79.6 kg)  07/20/22 170 lb 3.2 oz (77.2 kg)  05/27/22 167 lb 5.3 oz (75.9 kg)    General: Patient appears comfortable at rest. HEENT: Conjunctiva and lids normal. Neck: Supple, no elevated JVP or carotid bruits, no thyromegaly. Lungs: Clear to auscultation, nonlabored breathing at rest. Cardiac: Regular rate and rhythm, no S3 or significant systolic murmur, no pericardial rub. Abdomen: Somewhat protuberant, bowel sounds present, no guarding or rebound. Extremities: No pitting edema.  ECG:   An ECG dated 05/28/2022 was personally reviewed today and demonstrated:  Sinus bradycardia.  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 There is no current ischemia. The study is low risk.   0.5 mm of ST depression (II, III and aVF) was noted. Lateral precordial T wave inversion.   LV perfusion is abnormal.   Left ventricular function is normal. Nuclear stress EF: 64 %. The left ventricular ejection fraction is normal (55-65%). End diastolic cavity size is normal.  Echocardiogram 05/28/2022:  1. Left ventricular ejection fraction, by estimation, is 60 to 65%. The  left ventricle has no regional wall motion abnormalities. There is mild  left ventricular hypertrophy.   2. Right ventricular systolic function is normal. The right ventricular  size is normal.   3. The aortic valve is tricuspid. There is moderate calcification of the  aortic valve. There is moderate thickening of the aortic valve.   Assessment and Plan:  1.  HFpEF with LVEF 60 to 65% by recent evaluation, normal RV contraction.  Weight gradually increasing since recent hospital stay despite Lasix 20 mg daily.  Plan to start SGLT2 inhibitor and continue low-dose diuretic.  Can advance treatment further depending on how he does clinically.  2.  CAD, predominantly branch vessel with moderate RCA disease that has been managed medically.  Recent follow-up Myoview was suggestive of prior inferior infarct scar but no active ischemia.  No ACS with recent  hospitalization.  Continue aspirin, Plavix, Lipitor, and lisinopril.  3.  Carotid artery disease status post right transcarotid artery revascularization by Dr. Trula Slade in March.  He continues on aspirin and Plavix.  Carotid Dopplers in May showed 1 to 39% RICA stenosis and 40 to 63% LICA stenosis.  Medication Adjustments/Labs and Tests Ordered: Current medicines are reviewed at length with the patient today.  Concerns regarding medicines are outlined above.   Tests Ordered: No orders of the defined types were placed in this encounter.   Medication Changes: Meds ordered this encounter  Medications   dapagliflozin propanediol (FARXIGA) 10 MG TABS tablet    Sig: Take 1 tablet (10 mg total) by mouth daily before breakfast. Has 30 day free voucher    Dispense:  30 tablet    Refill:  6    08/04/2022 NEW    Disposition:  Follow up  3 months.  Signed, Satira Sark, MD, [facility] 08/04/2022 3:47 PM    [facility] [address], [place], [place] [address] Phone: [phone]; Fax: [phone]

## 2022-08-04 NOTE — Patient Instructions (Addendum)
Medication Instructions:  Your physician has recommended you make the following change in your medication:  Start farxiga 10 mg daily-co-pay is $47 per month Continue other medications the same  Labwork: none  Testing/Procedures: none  Follow-Up: Your physician recommends that you schedule a follow-up appointment in: 3 months  Any Other Special Instructions Will Be Listed Below (If Applicable).  If you need a refill on your cardiac medications before your next appointment, please call your pharmacy.

## 2022-08-21 ENCOUNTER — Encounter: Payer: Self-pay | Admitting: Cardiology

## 2022-08-24 ENCOUNTER — Telehealth: Payer: Self-pay | Admitting: Cardiology

## 2022-08-24 MED ORDER — FUROSEMIDE 20 MG PO TABS: 20.0000 mg | ORAL_TABLET | ORAL | Status: AC

## 2022-08-24 NOTE — Telephone Encounter (Signed)
Pt c/o medication issue:  1. Name of Medication: dapagliflozin propanediol (FARXIGA) 10 MG TABS tablet  2. How are you currently taking this medication (dosage and times per day)? As prescribed   3. Are you having a reaction (difficulty breathing--STAT)? Yes  4. What is your medication issue? Pt states that he feels dizzy with this medication, leg cramps and frequent urination. Requesting call back to discuss this medication.

## 2022-08-24 NOTE — Telephone Encounter (Signed)
Patient c/o dizziness since beginning the Farxiga.  Did decrease his Lisinopril to 2.'5mg'$  daily & the Lasix '20mg'$  to every other day as advised last Friday, 08/21/22  8/28 - 169.2lb - BP upon waking before medications - 155/66 8/27 - 168.2lb - 143/89 - blood sugar - 113 8/26 - 169.2lb - 136/77 - blood sugar - 131   Current BP while on the phone - 114/21 - 82 - blood sugar - 120  Has not had to take his Metformin in a couple of days. States he did have BP as low as 90/60 last week but he had been outside in the heat.  States that he is also on fluid restriction so hard to get too much extra hydration in.  Is not on any supplemental Potassium or Magnesium.

## 2022-08-26 NOTE — Progress Notes (Signed)
[facility] Note  09/02/2022     CHIEF COMPLAINT Patient presents for Retina Follow Up  HISTORY OF PRESENT ILLNESS: Christopher Randolph is a 76 y.o. male who presents to the clinic today for:   HPI     Retina Follow Up   Patient presents with  Other (CME).  In left eye.  Severity is moderate.  Duration of 6 weeks.  I, the attending physician,  performed the HPI with the patient and updated documentation appropriately.        Comments   Patient states vision the same OU. Using Pred Forte and prolensa bid OS. Using latanoprost qhs OS.      Last edited by [name], MD on 09/03/2022  3:49 PM.    Pt feels like vision is not what it was, he is using PF and Prolensa BID OS and latanoprost QHS   Referring physician: Curlene Labrum, MD [address] [place],  [place] [address]  HISTORICAL INFORMATION:   Selected notes from the MEDICAL RECORD NUMBER Referred by Dr. [name] for concern of vitreous hemorrhage   CURRENT MEDICATIONS: Current Outpatient Medications (Ophthalmic Drugs)  Medication Sig   latanoprost (XALATAN) 0.005 % ophthalmic solution Place 1 drop into the left eye at bedtime.   prednisoLONE acetate (PRED FORTE) 1 % ophthalmic suspension Place 1 drop into the left eye 2 (two) times daily.   Bromfenac Sodium (PROLENSA) 0.07 % SOLN Place 1 drop into the left eye daily.   cycloSPORINE (RESTASIS) 0.05 % ophthalmic emulsion Place 1 drop into both eyes daily. (Patient not taking: Reported on 09/02/2022)   No current facility-administered medications for this visit. (Ophthalmic Drugs)   Current Outpatient Medications (Other)  Medication Sig   acetaminophen (TYLENOL) 325 MG tablet Take 650 mg by mouth every 6 (six) hours as needed.   aspirin EC 81 MG tablet Take 81 mg by mouth daily. Swallow whole.   atorvastatin (LIPITOR) 20 MG tablet TAKE 1 TABLET BY MOUTH DAILY (Patient taking differently: Take 20 mg by mouth daily.)   clopidogrel (PLAVIX) 75 MG  tablet Take 1 tablet (75 mg total) by mouth daily.   dapagliflozin propanediol (FARXIGA) 10 MG TABS tablet Take 1 tablet (10 mg total) by mouth daily before breakfast. Has 30 day free voucher   fluticasone (FLONASE) 50 MCG/ACT nasal spray Place 2 sprays into both nostrils daily.   furosemide (LASIX) 20 MG tablet Take 1 tablet (20 mg total) by mouth every other day.   levothyroxine (SYNTHROID, LEVOTHROID) 150 MCG tablet Take 150 mcg by mouth See admin instructions. Alternates taking 150 mg daily for 2 days then 175 mcg the third day then repeat   lisinopril (ZESTRIL) 5 MG tablet Take 2.5 mg by mouth daily.   metFORMIN (GLUCOPHAGE) 500 MG tablet Take 500 mg by mouth daily.   mometasone (ELOCON) 0.1 % ointment Apply 1 application. topically at bedtime.   oxyCODONE-acetaminophen (PERCOCET) 5-325 MG tablet Take 1 tablet by mouth every 4 (four) hours as needed for severe pain.   tamsulosin (FLOMAX) 0.4 MG CAPS capsule Take 0.8 mg by mouth at bedtime.   No current facility-administered medications for this visit. (Other)   REVIEW OF SYSTEMS: ROS   Positive for: Musculoskeletal, Cardiovascular, Eyes Negative for: Constitutional, Gastrointestinal, Neurological, Skin, Genitourinary, HENT, Endocrine, Respiratory, Psychiatric, Allergic/Imm, Heme/Lymph Last edited by [name], COT on 09/02/2022  8:53 AM.     ALLERGIES Allergies  Allergen Reactions   Erythromycin Swelling  swelling of lips   Shellfish Allergy Rash and Other (See Comments)    Gout flares   Azithromycin Swelling   Nitrofuran Derivatives    PAST MEDICAL HISTORY Past Medical History:  Diagnosis Date   Arthritis    CAD (coronary artery disease)    Branch vessel and moderate RCA disease 2006   Cancer ([place]) 1990   Melanoma Lower right Leg   Carotid artery disease (HCC)    COPD (chronic obstructive pulmonary disease) ([place])    Essential hypertension    Glaucoma    Hyperlipidemia    Hypothyroidism    Lyme disease    PAD  (peripheral artery disease) ([place])    Left common iliac stent 2004   Pneumonia    TIA (transient ischemic attack) 02/2022   Type 2 diabetes mellitus ([place])    Wears dentures    Wears glasses    Past Surgical History:  Procedure Laterality Date   CATARACT EXTRACTION Bilateral    CATARACT EXTRACTION, BILATERAL     CERVICAL DISC SURGERY     x 1   COLONOSCOPY N/A 11/06/2016   Procedure: COLONOSCOPY;  Surgeon: Daneil Dolin, MD;  [facility];  Service: Endoscopy;  Laterality: N/A;  7:30 AM   COLONOSCOPY  2012   Dr. Anthony Sar: normal. reviewed reports, which states he has a history of polyps in remote past.    ELBOW SURGERY Left    EYE SURGERY Bilateral    Cat Sx   EYE SURGERY Left 01/14/2021   [place] - Dr. Jovita Kussmaul   IRIDOTOMY / IRIDECTOMY Left 01/14/2021   [place] - Dr. Jovita Kussmaul   JOINT REPLACEMENT     LUMBAR WOUND DEBRIDEMENT N/A 01/24/2019   Procedure: LUMBAR WOUND Exploration for Evacation of Seroma vs. Hematoma;  Surgeon: Jovita Gamma, MD;  Location: [place];  Service: Neurosurgery;  Laterality: N/A;   MELANOMA EXCISION     right leg, [facility] at [facility] and underwent immunotherapy   NECK SURGERY      x 1 yrs ago   PARS PLANA VITRECTOMY Left 04/04/2020   Procedure: PARS PLANA VITRECTOMY WITH 25G REMOVAL/SUTURE INTRAOCULAR LENS;  Surgeon: [name], MD;  Location: [place];  Service: Ophthalmology;  Laterality: Left;   REPLACEMENT TOTAL KNEE Right    TRANSCAROTID ARTERY REVASCULARIZATION  Right 03/25/2022   Procedure: Right Transcarotid Artery Revascularization;  Surgeon: Serafina Mitchell, MD;  [facility];  Service: Vascular;  Laterality: Right;   WISDOM TOOTH EXTRACTION     FAMILY HISTORY Family History  Problem Relation Age of Onset   Heart disease Mother    Heart attack Mother    Colon polyps Mother    Colon polyps Brother    Heart attack Maternal Grandmother    Heart attack Maternal Grandfather    Colon cancer Neg Hx    SOCIAL HISTORY Social History    Tobacco Use   Smoking status: Former    Packs/day: 1.50    Years: 40.00    Total pack years: 60.00    Types: Cigarettes    Start date: 11/04/1963    Quit date: 10/29/2003    Years since quitting: 18.8    Passive exposure: Never   Smokeless tobacco: Never  Vaping Use   Vaping Use: Never used  Substance Use Topics   Alcohol use: Yes    Alcohol/week: 15.0 standard drinks of alcohol    Types: 14 Cans of beer, 1 Standard drinks or equivalent per week    Comment: 2 beers  or a mixed drink   Drug use: No       OPHTHALMIC EXAM:  Base Eye Exam     Visual Acuity (Snellen - Linear)       Right Left   Dist cc 20/20+1 20/25   Dist ph cc  NI    Correction: Glasses         Tonometry (Tonopen, 9:01 AM)       Right Left   Pressure 09 14         Pupils       Dark Light Shape React APD   Right 3 2 Round Brisk None   Left 3 2 Round Brisk None         Visual Fields (Counting fingers)       Left Right    Full Full         Extraocular Movement       Right Left    Full, Ortho Full, Ortho         Neuro/Psych     Oriented x3: Yes   Mood/Affect: Normal         Dilation     Both eyes: 1.0% Mydriacyl, 2.5% Phenylephrine @ 9:01 AM           Slit Lamp and Fundus Exam     External Exam       Right Left   External brow ptosis brow ptosis; mild periorbital edema and erythema         Slit Lamp Exam       Right Left   Lids/Lashes Dermatochalasis - upper lid, mild Telangiectasia, mild Meibomian gland dysfunction Dermatochalasis, Telangiectasia, Meibomian gland dysfunction, Ptosis   Conjunctiva/Sclera Nasal and temporal Pinguecula temporal Pinguecula, ahmed plate and scleral patch graft ST quad -- nicely healed, bleb ST conj   Cornea well healed cataract wound, trace tear film debris well healed cataract wound, mild tear film debris   Anterior Chamber Deep and quiet, no cell/flare Deep, tube at 0100, No cell or pigment   Iris No NVI, Round and  moderately dilated No NVI, moderately dilated to 4.85m, Transillumination defects at 0130   Lens Posterior chamber intraocular lens in good position sutured Akreos IOL in good position   Anterior Vitreous vitreous syneresis post vitrectomy, clear         Fundus Exam       Right Left   Disc Pink and Sharp Pink and Sharp, Compact, mild temporal PPA   C/D Ratio 0.2 0.2   Macula Flat, good foveal reflex, mild RPE mottling, No heme or edema Flat. blunted foveal reflex, central cystic changes - improved, mild RPE mottling, No heme   Vessels mild attenuation, mild tortuosity attenuated, Tortuous   Periphery Attached, round focal area of CR atrophy at 0630 mid zone Attached, No heme           Refraction     Wearing Rx       Sphere Cylinder Axis   Right +0.50 +1.00 178   Left -2.75 +2.50 175           IMAGING AND PROCEDURES  Imaging and Procedures for '@TODAY'$ @  OCT, Retina - OU - Both Eyes       Right Eye Quality was good. Central Foveal Thickness: 269. Progression has been stable. Findings include normal foveal contour, no IRF, no SRF (partial PVD - stable).   Left Eye Quality was good. Central Foveal Thickness: 288. Progression has been stable. Findings include normal foveal contour,  no IRF, no SRF (stable resolution of central cystic changes / CME).   Notes *Images captured and stored on drive  Diagnosis / Impression:  OD: NFP, no IRF, no SRF; partial PVD OS: stable resolution of central cystic changes / CME  Clinical management:  See below  Abbreviations: NFP - Normal foveal profile. CME - cystoid macular edema. PED - pigment epithelial detachment. IRF - intraretinal fluid. SRF - subretinal fluid. EZ - ellipsoid zone. ERM - epiretinal membrane. ORA - outer retinal atrophy. ORT - outer retinal tubulation. SRHM - subretinal hyper-reflective material            ASSESSMENT/PLAN:    ICD-10-CM   1. Uveitis-hyphema-glaucoma syndrome of left eye  T85.398A     H20.9    H40.42X0     2. 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 Pigmentary glaucoma of left eye, mild stage  H40.1321     4. Dislocated IOL (intraocular lens), anterior, left  H27.122     5. Cystoid macular edema of left eye  H35.352 OCT, Retina - OU - Both Eyes    6. Pseudophakia of both eyes  Z96.1     7. Lyme disease  A69.20     8. Dermatochalasis of both upper eyelids  H02.831    H02.834     9. Brow ptosis  H57.819      1-4. UGH Syndrome OS  - originally: 3 piece PCIOL OS centered, but iris has large TID in sup temp quadrant with IOL haptic within area  - [facility] at [facility] OS and was started on max drops (Cosopt, Brim, latan, and po diamox)  - repeat gonio showed open angles OS, mild focal PAS  - s/p PPV w/ IOL exchange / sutured secondary Akreos OS IOL 04.08.21             - today, doing well, BCVA OS 20/25 from 20/30  - OCT shows stable resolution of CME/IRF             - IOP 14 OS  - s/p Ahmed Valve OS on 1.18.22 ([place])  - now released from Dr. Duayne Cal care  - monitor   5. CME OS -- recurrent  - mild CME that was improved on 7.21.21  - interval re-development of CME  06.21.23 (prior recurrences 08.18.21 and 12.16.22)  - s/p STK OS (08.18.21)  - OCT today (09.06.23) shows stable resolution of central cystic changes / CME  - BCVA improved to 20/25 from 20/30  - decrease PF and Prolensa to QDaily OS  - f/u 2-3 months, DFE, OCT  6. Pseudophakia OU  - s/p CE/IOL OU -- pt can't remember dates, one eye by [facility], [facility] eye by [facility]  - h/o UGH OS and now sutured IOL as above  - PCIOL OD appears to be in good position  - sutured Akreos IOL OS in good position  - monitor   7. Lyme disease  - history of diagnosis from target lesions on lower extremities  - no blood test confirmation performed   - completed doxycycline per PCP  - no manifestations in the eye, but will continue to monitor   8,9. Brow ptosis, dermatochalasis OU  - referred to Dr. Vickki Muff  (now at [facility]) for eyelid eval  - holding on repair for now  Ophthalmic Meds Ordered this visit:  Meds ordered this encounter  Medications   Bromfenac Sodium (PROLENSA) 0.07 % SOLN    Sig: Place 1 drop  into the left eye daily.    Dispense:  6 mL    Refill:  3     Return for f/u 2-3 months, CME OS, DFE, OCT.  There are no Patient Instructions on file for this visit.   This document serves as a record of services personally performed by Gardiner Sleeper, MD, PhD. It was created on their behalf by Orvan Falconer, an ophthalmic technician. The creation of this record is the provider's dictation and/or activities during the visit.    Electronically signed by: Orvan Falconer, OA, 09/03/22  3:53 PM  This document serves as a record of services personally performed by Gardiner Sleeper, MD, PhD. It was created on their behalf by San Jetty. Owens Shark, OA an ophthalmic technician. The creation of this record is the provider's dictation and/or activities during the visit.    Electronically signed by: San Jetty. Owens Shark, [place] 09.06.2023 3:53 PM  Gardiner Sleeper, M.D., Ph.D. Diseases & Surgery of the Retina and Vitreous [facility]  I have reviewed the above documentation for accuracy and completeness, and I agree with the above. Gardiner Sleeper, M.D., Ph.D. 09/03/22 3:53 PM  Abbreviations: M myopia (nearsighted); A astigmatism; H hyperopia (farsighted); P presbyopia; Mrx spectacle prescription;  CTL contact lenses; OD right eye; OS left eye; OU both eyes  XT exotropia; ET esotropia; PEK punctate epithelial keratitis; PEE punctate epithelial erosions; DES dry eye syndrome; MGD meibomian gland dysfunction; ATs artificial tears; PFAT's preservative free artificial tears; [place] nuclear sclerotic cataract; PSC posterior subcapsular cataract; ERM epi-retinal membrane; PVD posterior vitreous detachment; RD retinal detachment; DM diabetes mellitus; DR diabetic retinopathy; NPDR  non-proliferative diabetic retinopathy; PDR proliferative diabetic retinopathy; CSME clinically significant macular edema; DME diabetic macular edema; dbh dot blot hemorrhages; CWS cotton wool spot; POAG primary open angle glaucoma; C/D cup-to-disc ratio; HVF humphrey visual field; GVF goldmann visual field; OCT optical coherence tomography; IOP intraocular pressure; BRVO Branch retinal vein occlusion; CRVO central retinal vein occlusion; CRAO central retinal artery occlusion; BRAO branch retinal artery occlusion; RT retinal tear; SB scleral buckle; PPV pars plana vitrectomy; VH Vitreous hemorrhage; PRP panretinal laser photocoagulation; IVK intravitreal kenalog; VMT vitreomacular traction; MH Macular hole;  NVD neovascularization of the disc; NVE neovascularization elsewhere; AREDS age related eye disease study; ARMD age related macular degeneration; POAG primary open angle glaucoma; EBMD epithelial/anterior basement membrane dystrophy; ACIOL anterior chamber intraocular lens; IOL intraocular lens; PCIOL posterior chamber intraocular lens; Phaco/IOL phacoemulsification with intraocular lens placement; [place] photorefractive keratectomy; LASIK laser assisted in situ keratomileusis; HTN hypertension; DM diabetes mellitus; COPD chronic obstructive pulmonary disease

## 2022-08-27 NOTE — Telephone Encounter (Signed)
Patient aware & appointment moved up.

## 2022-09-02 ENCOUNTER — Encounter (INDEPENDENT_AMBULATORY_CARE_PROVIDER_SITE_OTHER): Payer: Self-pay | Admitting: Ophthalmology

## 2022-09-02 ENCOUNTER — Ambulatory Visit (INDEPENDENT_AMBULATORY_CARE_PROVIDER_SITE_OTHER): Payer: Medicare Other | Admitting: Ophthalmology

## 2022-09-02 ENCOUNTER — Encounter (INDEPENDENT_AMBULATORY_CARE_PROVIDER_SITE_OTHER): Payer: Medicare Other | Admitting: Ophthalmology

## 2022-09-02 DIAGNOSIS — A6920 Lyme disease, unspecified: Secondary | ICD-10-CM

## 2022-09-02 DIAGNOSIS — H209 Unspecified iridocyclitis: Secondary | ICD-10-CM | POA: Diagnosis not present

## 2022-09-02 DIAGNOSIS — Z961 Presence of intraocular lens: Secondary | ICD-10-CM | POA: Diagnosis not present

## 2022-09-02 DIAGNOSIS — H401321 Pigmentary glaucoma, left eye, mild stage: Secondary | ICD-10-CM | POA: Diagnosis not present

## 2022-09-02 DIAGNOSIS — H02831 Dermatochalasis of right upper eyelid: Secondary | ICD-10-CM

## 2022-09-02 DIAGNOSIS — H27122 Anterior dislocation of lens, left eye: Secondary | ICD-10-CM | POA: Diagnosis not present

## 2022-09-02 DIAGNOSIS — H57819 Brow ptosis, unspecified: Secondary | ICD-10-CM

## 2022-09-02 DIAGNOSIS — H35352 Cystoid macular degeneration, left eye: Secondary | ICD-10-CM

## 2022-09-02 DIAGNOSIS — T85398A Other mechanical complication of other ocular prosthetic devices, implants and grafts, initial encounter: Secondary | ICD-10-CM

## 2022-09-02 MED ORDER — PROLENSA 0.07 % OP SOLN: 1.0000 [drp] | Freq: Every day | OPHTHALMIC | 3 refills | Status: DC

## 2022-09-03 ENCOUNTER — Encounter (INDEPENDENT_AMBULATORY_CARE_PROVIDER_SITE_OTHER): Payer: Self-pay | Admitting: Ophthalmology

## 2022-09-16 ENCOUNTER — Ambulatory Visit: Payer: Medicare Other | Admitting: Cardiology

## 2022-09-22 DIAGNOSIS — M722 Plantar fascial fibromatosis: Secondary | ICD-10-CM | POA: Diagnosis not present

## 2022-09-30 ENCOUNTER — Encounter: Payer: Self-pay | Admitting: Student

## 2022-09-30 ENCOUNTER — Ambulatory Visit: Payer: Medicare Other | Attending: Cardiology | Admitting: Student

## 2022-09-30 VITALS — BP 114/58 | HR 69 | Ht 68.0 in | Wt 173.0 lb

## 2022-09-30 DIAGNOSIS — I5032 Chronic diastolic (congestive) heart failure: Secondary | ICD-10-CM | POA: Diagnosis not present

## 2022-09-30 DIAGNOSIS — I6521 Occlusion and stenosis of right carotid artery: Secondary | ICD-10-CM

## 2022-09-30 DIAGNOSIS — Z79899 Other long term (current) drug therapy: Secondary | ICD-10-CM | POA: Diagnosis not present

## 2022-09-30 DIAGNOSIS — E785 Hyperlipidemia, unspecified: Secondary | ICD-10-CM

## 2022-09-30 DIAGNOSIS — I2510 Atherosclerotic heart disease of native coronary artery without angina pectoris: Secondary | ICD-10-CM

## 2022-09-30 DIAGNOSIS — I10 Essential (primary) hypertension: Secondary | ICD-10-CM | POA: Diagnosis not present

## 2022-09-30 NOTE — Progress Notes (Signed)
Cardiology Office Note    Date:  09/30/2022   ID:  Christopher Randolph, DOB January 20, 1946, MRN [id number]  PCP:  Christopher Labrum, MD  Cardiologist: Christopher Lesches, MD    Chief Complaint  Patient presents with   Follow-up    2 month visit    History of Present Illness:    Christopher Randolph is a 76 y.o. male with past medical history of CAD (s/p cath in 2006 showing moderate RCA disease, low-risk NST in 04/2021 and 05/2022), carotid artery stenosis (s/p right TCAR in 02/2022), HFpEF, HTN, HLD, Type 2 DM and prior TIA who presents to the office today for 43-monthfollow-up.   He was last examined by Dr. MDomenic Randolph 07/2022 following his recent hospitalization for chest pain during which he ruled out for ACS and repeat NST showed no current ischemia. At the time of his visit, he reported having worsening edema and weight gain despite taking Lasix '20mg'$  daily. Therefore, he was started on Farxiga '10mg'$  daily to assist with diuresis in the setting of HFpEF.   He did notify the office later that month about worsening dizziness and episodes of hypotension since starting Farxiga. It was recommended he reduce Lisinopril to 2.'5mg'$  daily and take Lasix '20mg'$  every other day. His follow-up visit was moved up for reassessment.   In talking with the patient today, he reports feeling the best he has in several months. Reports his dizziness significantly improved with reducing Lasix to 20 mg every other day. He did have hypertension with dose reduction of Lisinopril and his PCP did titrate this to 10 mg daily in the interim. His BP is at 114/58 during today's visit he reports BP has been well controlled when checked at home. Says that his overall stamina is improving. No specific chest pain or palpitations. No recent orthopnea, PND or pitting edema. He also reports his weight has been stable on his home scales. He does try to limit his sodium intake.   Past Medical History:  Diagnosis Date   Arthritis    CAD  (coronary artery disease)    Branch vessel and moderate RCA disease 2006   Cancer ([facility]   Melanoma Lower right Leg   Carotid artery disease (HCC)    COPD (chronic obstructive pulmonary disease) ([facility]    Essential hypertension    Glaucoma    Hyperlipidemia    Hypothyroidism    Lyme disease    PAD (peripheral artery disease) ([facility]    Left common iliac stent 2004   Pneumonia    TIA (transient ischemic attack) 02/2022   Type 2 diabetes mellitus ([facility]    Wears dentures    Wears glasses     Past Surgical History:  Procedure Laterality Date   CATARACT EXTRACTION Bilateral    CATARACT EXTRACTION, BILATERAL     CERVICAL DISC SURGERY     x 1   COLONOSCOPY N/A 11/06/2016   Procedure: COLONOSCOPY;  Surgeon: Christopher Dolin MD;  [facility];  Service: Endoscopy;  Laterality: N/A;  7:30 AM   COLONOSCOPY  2012   Dr. DAnthony Randolph normal. reviewed reports, which states he has a history of polyps in remote past.    ELBOW SURGERY Left    EYE SURGERY Bilateral    Cat Sx   EYE SURGERY Left 01/14/2021   [place] - Dr. FJovita Randolph  IRIDOTOMY / IRIDECTOMY Left 01/14/2021   [facility] - Dr. FJovita Randolph  JOINT REPLACEMENT  LUMBAR WOUND DEBRIDEMENT N/A 01/24/2019   Procedure: LUMBAR WOUND Exploration for Evacation of Seroma vs. Hematoma;  Surgeon: Christopher Gamma, MD;  Location: [place];  Service: Neurosurgery;  Laterality: N/A;   MELANOMA EXCISION     right leg, [facility] at [facility] and underwent immunotherapy   NECK SURGERY      x 1 yrs ago   PARS PLANA VITRECTOMY Left 04/04/2020   Procedure: PARS PLANA VITRECTOMY WITH 25G REMOVAL/SUTURE INTRAOCULAR LENS;  Surgeon: Christopher Caffey, MD;  Location: [place];  Service: Ophthalmology;  Laterality: Left;   REPLACEMENT TOTAL KNEE Right    TRANSCAROTID ARTERY REVASCULARIZATION  Right 03/25/2022   Procedure: Right Transcarotid Artery Revascularization;  Surgeon: Christopher Mitchell, MD;  Location: [facility];  Service: Vascular;  Laterality: Right;   WISDOM TOOTH  EXTRACTION      Current Medications: Outpatient Medications Prior to Visit  Medication Sig Dispense Refill   acetaminophen (TYLENOL) 325 MG tablet Take 650 mg by mouth every 6 (six) hours as needed.     aspirin EC 81 MG tablet Take 81 mg by mouth daily. Swallow whole.     atorvastatin (LIPITOR) 20 MG tablet TAKE 1 TABLET BY MOUTH DAILY (Patient taking differently: Take 20 mg by mouth daily.) 90 tablet 3   Bromfenac Sodium (PROLENSA) 0.07 % SOLN Place 1 drop into the left eye daily. 6 mL 3   clopidogrel (PLAVIX) 75 MG tablet Take 1 tablet (75 mg total) by mouth daily. 30 tablet 11   dapagliflozin propanediol (FARXIGA) 10 MG TABS tablet Take 1 tablet (10 mg total) by mouth daily before breakfast. Has 30 day free voucher 30 tablet 6   fluticasone (FLONASE) 50 MCG/ACT nasal spray Place 2 sprays into both nostrils daily.     furosemide (LASIX) 20 MG tablet Take 1 tablet (20 mg total) by mouth every other day.     latanoprost (XALATAN) 0.005 % ophthalmic solution Place 1 drop into the left eye at bedtime.     levothyroxine (SYNTHROID, LEVOTHROID) 150 MCG tablet Take 150 mcg by mouth See admin instructions. Alternates taking 150 mg daily for 2 days then 175 mcg the third day then repeat  1   lisinopril (ZESTRIL) 10 MG tablet Take 10 mg by mouth daily.     prednisoLONE acetate (PRED FORTE) 1 % ophthalmic suspension Place 1 drop into the left eye 2 (two) times daily.     tamsulosin (FLOMAX) 0.4 MG CAPS capsule Take 0.8 mg by mouth at bedtime.     lisinopril (ZESTRIL) 5 MG tablet Take 2.5 mg by mouth daily.     cycloSPORINE (RESTASIS) 0.05 % ophthalmic emulsion Place 1 drop into both eyes daily. (Patient not taking: Reported on 09/02/2022)     mometasone (ELOCON) 0.1 % ointment Apply 1 application. topically at bedtime.     oxyCODONE-acetaminophen (PERCOCET) 5-325 MG tablet Take 1 tablet by mouth every 4 (four) hours as needed for severe pain. (Patient not taking: Reported on 09/30/2022) 10 tablet 0    metFORMIN (GLUCOPHAGE) 500 MG tablet Take 500 mg by mouth daily. (Patient not taking: Reported on 09/30/2022)     No facility-administered medications prior to visit.     Allergies:   Erythromycin, Shellfish allergy, Azithromycin, and Nitrofuran derivatives   Social History   Socioeconomic History   Marital status: Married    Spouse name: Detta   Number of children: Not on file   Years of education: Not on file   Highest education level: Not on file  Occupational  History   Not on file  Tobacco Use   Smoking status: Former    Packs/day: 1.50    Years: 40.00    Total pack years: 60.00    Types: Cigarettes    Start date: 11/04/1963    Quit date: 10/29/2003    Years since quitting: 18.9    Passive exposure: Never   Smokeless tobacco: Never  Vaping Use   Vaping Use: Never used  Substance and Sexual Activity   Alcohol use: Yes    Alcohol/week: 15.0 standard drinks of alcohol    Types: 14 Cans of beer, 1 Standard drinks or equivalent per week    Comment: 2 beers or a mixed drink   Drug use: No   Sexual activity: Yes  Other Topics Concern   Not on file  Social History Narrative   Lives with wife   Social Determinants of Health   Financial Resource Strain: Not on file  Food Insecurity: Not on file  Transportation Needs: Not on file  Physical Activity: Not on file  Stress: Not on file  Social Connections: Not on file     Family History:  The patient's family history includes Colon polyps in his brother and mother; Heart attack in his maternal grandfather, maternal grandmother, and mother; Heart disease in his mother.   Review of Systems:    Please see the history of present illness.     All other systems reviewed and are otherwise negative except as noted above.   Physical Exam:    VS:  BP (!) 114/58   Pulse 69   Ht '5\' 8"'$  (1.727 m)   Wt 173 lb (78.5 kg)   SpO2 95%   BMI 26.30 kg/m    General: Well developed, well nourished,male appearing in no acute  distress. Head: Normocephalic, atraumatic. Neck: No carotid bruits. JVD not elevated.  Lungs: Respirations regular and unlabored, without wheezes or rales.  Heart: Regular rate and rhythm. No S3 or S4.  No murmur, no rubs, or gallops appreciated. Abdomen: Appears non-distended. No obvious abdominal masses. Msk:  Strength and tone appear normal for age. No obvious joint deformities or effusions. Extremities: No clubbing or cyanosis. No pitting edema.  Distal pedal pulses are 2+ bilaterally. Neuro: Alert and oriented X 3. Moves all extremities spontaneously. No focal deficits noted. Psych:  Responds to questions appropriately with a normal affect. Skin: No rashes or lesions noted  Wt Readings from Last 3 Encounters:  09/30/22 173 lb (78.5 kg)  08/04/22 175 lb 6.4 oz (79.6 kg)  07/20/22 170 lb 3.2 oz (77.2 kg)     Studies/Labs Reviewed:   EKG:  EKG is not ordered today.   Recent Labs: 03/31/2022: ALT 27; TSH 6.760 05/27/2022: B Natriuretic Peptide 43.0; BUN 18; Creatinine, Ser 0.95; Hemoglobin 14.3; Magnesium 1.7; Platelets 153; Potassium 3.5; Sodium 138   Lipid Panel    Component Value Date/Time   CHOL 88 03/26/2022 0127   TRIG 61 03/26/2022 0127   HDL 31 (L) 03/26/2022 0127   CHOLHDL 2.8 03/26/2022 0127   VLDL 12 03/26/2022 0127   LDLCALC 45 03/26/2022 0127    Additional studies/ records that were reviewed today include:   Limited Echo: 05/2022 IMPRESSIONS     1. Left ventricular ejection fraction, by estimation, is 60 to 65%. The  left ventricle has no regional wall motion abnormalities. There is mild  left ventricular hypertrophy.   2. Right ventricular systolic function is normal. The right ventricular  size is normal.  3. The aortic valve is tricuspid. There is moderate calcification of the  aortic valve. There is moderate thickening of the aortic valve.   NST: 05/2022   Findings are consistent with prior inferior myocardial infarction. There is no current  ischemia. The study is low risk.   0.5 mm of ST depression (II, III and aVF) was noted. Lateral precordial T wave inversion.   LV perfusion is abnormal.   Left ventricular function is normal. Nuclear stress EF: 64 %. The left ventricular ejection fraction is normal (55-65%). End diastolic cavity size is normal.  Assessment:    1. Coronary artery disease involving native coronary artery of native heart without angina pectoris   2. Chronic heart failure with preserved ejection fraction ([place])   3. Medication management   4. Right-sided extracranial carotid artery stenosis   5. Essential hypertension   6. Hyperlipidemia LDL goal <70      Plan:   In order of problems listed above:  1. CAD - He is s/p cath in 2006 showing moderate RCA disease with low-risk NST's in 04/2021 and 05/2022. He has been increasing his activity level and denies any recent anginal symptoms. - Continue current medical therapy with ASA 81 mg daily, Plavix 75 mg daily (on this per Vascular Surgery) and Atorvastatin 20 mg daily.  2. HFpEF - He reports his breathing has been stable and he appears euvolemic by examination today. Continue current medical therapy with Farxiga 10 mg daily and Lasix 20 mg every other day. Will recheck a BMET for reassessment of his electrolytes and renal function.   3. Carotid Artery Stenosis  - He is s/p right TCAR in 02/2022. Dopplers in 04/2022 showed a patent stent along the right ICA and 40 to 59% along the left ICA. Followed by Vascular Surgery. He remains on ASA 81 mg daily, Plavix 75 mg daily and Atorvastatin 20 mg daily.  4. HTN - His blood pressure is well controlled at 114/58 during today's visit. I did check orthostatics today given his prior dizziness and these were negative. Continue current medical therapy with Lisinopril 10 mg daily.  5. HLD - FLP in 02/2022 showed total cholesterol 88, triglycerides 61, HDL 31 and LDL 45. Continue Atorvastatin 20 mg daily.   Medication  Adjustments/Labs and Tests Ordered: Current medicines are reviewed at length with the patient today.  Concerns regarding medicines are outlined above.  Medication changes, Labs and Tests ordered today are listed in the Patient Instructions below. Patient Instructions  Medication Instructions:  Your physician recommends that you continue on your current medications as directed. Please refer to the Current Medication list given to you today.  *If you need a refill on your cardiac medications before your next appointment, please call your pharmacy*   Lab Work: Your physician recommends that you return for lab work. (BMET)   If you have labs (blood work) drawn today and your tests are completely normal, you will receive your results only by: [place] (if you have MyChart) OR A paper copy in the mail If you have any lab test that is abnormal or we need to change your treatment, we will call you to review the results.   Testing/Procedures: NONE    Follow-Up: At [facility], you and your health needs are our priority.  As part of our continuing mission to provide you with exceptional heart care, we have created designated Provider Care Teams.  These Care Teams include your primary Cardiologist (physician) and Advanced Practice Providers (  APPs -  Physician Assistants and Nurse Practitioners) who all work together to provide you with the care you need, when you need it.  We recommend signing up for the patient portal called "MyChart".  Sign up information is provided on this After Visit Summary.  MyChart is used to connect with patients for Virtual Visits (Telemedicine).  Patients are able to view lab/test results, encounter notes, upcoming appointments, etc.  Non-urgent messages can be sent to your provider as well.   To learn more about what you can do with MyChart, go to [email or web address].    Your next appointment:   6 month(s)  The format for your next appointment:    In Person  Provider:   Rozann Lesches, MD    Other Instructions Thank you for choosing [facility]!    Important Information About Sugar         Signed, Erma Heritage, PA-C  09/30/2022 5:30 PM    [facility] [address] [place], [place] [address] Phone: [phone] Fax: [phone]

## 2022-09-30 NOTE — Patient Instructions (Signed)
Medication Instructions:  Your physician recommends that you continue on your current medications as directed. Please refer to the Current Medication list given to you today.  *If you need a refill on your cardiac medications before your next appointment, please call your pharmacy*   Lab Work: Your physician recommends that you return for lab work. (BMET)   If you have labs (blood work) drawn today and your tests are completely normal, you will receive your results only by: [place] (if you have MyChart) OR A paper copy in the mail If you have any lab test that is abnormal or we need to change your treatment, we will call you to review the results.   Testing/Procedures: NONE    Follow-Up: At [facility], you and your health needs are our priority.  As part of our continuing mission to provide you with exceptional heart care, we have created designated Provider Care Teams.  These Care Teams include your primary Cardiologist (physician) and Advanced Practice Providers (APPs -  Physician Assistants and Nurse Practitioners) who all work together to provide you with the care you need, when you need it.  We recommend signing up for the patient portal called "MyChart".  Sign up information is provided on this After Visit Summary.  MyChart is used to connect with patients for Virtual Visits (Telemedicine).  Patients are able to view lab/test results, encounter notes, upcoming appointments, etc.  Non-urgent messages can be sent to your provider as well.   To learn more about what you can do with MyChart, go to [email or web address].    Your next appointment:   6 month(s)  The format for your next appointment:   In Person  Provider:   Rozann Lesches, MD    Other Instructions Thank you for choosing [facility]!    Important Information About Sugar

## 2022-10-06 DIAGNOSIS — M25571 Pain in right ankle and joints of right foot: Secondary | ICD-10-CM | POA: Diagnosis not present

## 2022-10-06 DIAGNOSIS — M79671 Pain in right foot: Secondary | ICD-10-CM | POA: Diagnosis not present

## 2022-10-08 ENCOUNTER — Encounter (INDEPENDENT_AMBULATORY_CARE_PROVIDER_SITE_OTHER): Payer: Medicare Other | Admitting: Ophthalmology

## 2022-10-09 DIAGNOSIS — M25571 Pain in right ankle and joints of right foot: Secondary | ICD-10-CM | POA: Diagnosis not present

## 2022-10-27 DIAGNOSIS — H16122 Filamentary keratitis, left eye: Secondary | ICD-10-CM | POA: Diagnosis not present

## 2022-10-27 DIAGNOSIS — Z961 Presence of intraocular lens: Secondary | ICD-10-CM | POA: Diagnosis not present

## 2022-10-27 DIAGNOSIS — H35352 Cystoid macular degeneration, left eye: Secondary | ICD-10-CM | POA: Diagnosis not present

## 2022-10-27 DIAGNOSIS — H02831 Dermatochalasis of right upper eyelid: Secondary | ICD-10-CM | POA: Diagnosis not present

## 2022-10-27 DIAGNOSIS — T8579XS Infection and inflammatory reaction due to other internal prosthetic devices, implants and grafts, sequela: Secondary | ICD-10-CM | POA: Diagnosis not present

## 2022-10-27 DIAGNOSIS — H02834 Dermatochalasis of left upper eyelid: Secondary | ICD-10-CM | POA: Diagnosis not present

## 2022-10-27 DIAGNOSIS — H57813 Brow ptosis, bilateral: Secondary | ICD-10-CM | POA: Diagnosis not present

## 2022-10-27 DIAGNOSIS — E119 Type 2 diabetes mellitus without complications: Secondary | ICD-10-CM | POA: Diagnosis not present

## 2022-10-28 NOTE — Progress Notes (Signed)
[facility] Note  11/02/2022     CHIEF COMPLAINT Patient presents for Retina Follow Up  HISTORY OF PRESENT ILLNESS: Christopher Randolph is a 76 y.o. male who presents to the clinic today for:   HPI     Retina Follow Up   Patient presents with  Other (CME).  In left eye.  This started 2 months ago.  Severity is moderate.  Duration of 2 months.  I, the attending physician,  performed the HPI with the patient and updated documentation appropriately.        Comments   Retina follow up [facility] and CME pt states no vision changes noticed he is still having some blurred vision       Last edited by [name], MD on 11/03/2022  1:21 AM.    Pt states vision is stable, he is using PF and Prolensa once a day in the left eye in the morning and latanoprost at night  Referring physician: Curlene Labrum, MD [place],  [place] [id number]  HISTORICAL INFORMATION:   Selected notes from the MEDICAL RECORD NUMBER Referred by Dr. [name] for concern of vitreous hemorrhage   CURRENT MEDICATIONS: Current Outpatient Medications (Ophthalmic Drugs)  Medication Sig   Bromfenac Sodium (PROLENSA) 0.07 % SOLN Place 1 drop into the left eye daily.   cycloSPORINE (RESTASIS) 0.05 % ophthalmic emulsion Place 1 drop into both eyes daily. (Patient not taking: Reported on 09/02/2022)   latanoprost (XALATAN) 0.005 % ophthalmic solution Place 1 drop into the left eye at bedtime.   prednisoLONE acetate (PRED FORTE) 1 % ophthalmic suspension Place 1 drop into the left eye 2 (two) times daily.   No current facility-administered medications for this visit. (Ophthalmic Drugs)   Current Outpatient Medications (Other)  Medication Sig   acetaminophen (TYLENOL) 325 MG tablet Take 650 mg by mouth every 6 (six) hours as needed.   aspirin EC 81 MG tablet Take 81 mg by mouth daily. Swallow whole.   atorvastatin (LIPITOR) 20 MG tablet TAKE 1 TABLET BY MOUTH DAILY (Patient taking  differently: Take 20 mg by mouth daily.)   clopidogrel (PLAVIX) 75 MG tablet Take 1 tablet (75 mg total) by mouth daily.   dapagliflozin propanediol (FARXIGA) 10 MG TABS tablet Take 1 tablet (10 mg total) by mouth daily before breakfast. Has 30 day free voucher   fluticasone (FLONASE) 50 MCG/ACT nasal spray Place 2 sprays into both nostrils daily.   furosemide (LASIX) 20 MG tablet Take 1 tablet (20 mg total) by mouth every other day.   levothyroxine (SYNTHROID, LEVOTHROID) 150 MCG tablet Take 150 mcg by mouth See admin instructions. Alternates taking 150 mg daily for 2 days then 175 mcg the third day then repeat   lisinopril (ZESTRIL) 10 MG tablet Take 10 mg by mouth daily.   mometasone (ELOCON) 0.1 % ointment Apply 1 application. topically at bedtime.   oxyCODONE-acetaminophen (PERCOCET) 5-325 MG tablet Take 1 tablet by mouth every 4 (four) hours as needed for severe pain. (Patient not taking: Reported on 09/30/2022)   tamsulosin (FLOMAX) 0.4 MG CAPS capsule Take 0.8 mg by mouth at bedtime.   No current facility-administered medications for this visit. (Other)   REVIEW OF SYSTEMS: ROS   Positive for: Eyes Negative for: Constitutional, Gastrointestinal, Neurological, Skin, Genitourinary, Musculoskeletal, HENT, Endocrine, Cardiovascular, Respiratory, Psychiatric, Allergic/Imm, Heme/Lymph Last edited by [name], MD on 11/03/2022  1:21 AM.     ALLERGIES Allergies  Allergen Reactions  Erythromycin Swelling    swelling of lips   Shellfish Allergy Rash and Other (See Comments)    Gout flares   Azithromycin Swelling   Nitrofuran Derivatives    PAST MEDICAL HISTORY Past Medical History:  Diagnosis Date   Arthritis    CAD (coronary artery disease)    Branch vessel and moderate RCA disease 2006   Cancer ([facility]) 1990   Melanoma Lower right Leg   Carotid artery disease (HCC)    COPD (chronic obstructive pulmonary disease) ([facility])    Essential hypertension    Glaucoma    Hyperlipidemia     Hypothyroidism    Lyme disease    PAD (peripheral artery disease) ([facility])    Left common iliac stent 2004   Pneumonia    TIA (transient ischemic attack) 02/2022   Type 2 diabetes mellitus ([facility])    Wears dentures    Wears glasses    Past Surgical History:  Procedure Laterality Date   CATARACT EXTRACTION Bilateral    CATARACT EXTRACTION, BILATERAL     CERVICAL DISC SURGERY     x 1   COLONOSCOPY N/A 11/06/2016   Procedure: COLONOSCOPY;  Surgeon: Daneil Dolin, MD;  [facility];  Service: Endoscopy;  Laterality: N/A;  7:30 AM   COLONOSCOPY  2012   Dr. Anthony Sar: normal. reviewed reports, which states he has a history of polyps in remote past.    ELBOW SURGERY Left    EYE SURGERY Bilateral    Cat Sx   EYE SURGERY Left 01/14/2021   [facility] - Dr. Jovita Kussmaul   IRIDOTOMY / IRIDECTOMY Left 01/14/2021   [facility] - Dr. Jovita Kussmaul   JOINT REPLACEMENT     LUMBAR WOUND DEBRIDEMENT N/A 01/24/2019   Procedure: LUMBAR WOUND Exploration for Evacation of Seroma vs. Hematoma;  Surgeon: Jovita Gamma, MD;  Location: [facility];  Service: Neurosurgery;  Laterality: N/A;   MELANOMA EXCISION     right leg, [facility] and underwent immunotherapy   NECK SURGERY      x 1 yrs ago   PARS PLANA VITRECTOMY Left 04/04/2020   Procedure: PARS PLANA VITRECTOMY WITH 25G REMOVAL/SUTURE INTRAOCULAR LENS;  Surgeon: [name], MD;  Location: [facility];  Service: Ophthalmology;  Laterality: Left;   REPLACEMENT TOTAL KNEE Right    TRANSCAROTID ARTERY REVASCULARIZATION  Right 03/25/2022   Procedure: Right Transcarotid Artery Revascularization;  Surgeon: Serafina Mitchell, MD;  Location: [facility];  Service: Vascular;  Laterality: Right;   WISDOM TOOTH EXTRACTION     FAMILY HISTORY Family History  Problem Relation Age of Onset   Heart disease Mother    Heart attack Mother    Colon polyps Mother    Colon polyps Brother    Heart attack Maternal Grandmother    Heart attack Maternal Grandfather    Colon cancer  Neg Hx    SOCIAL HISTORY Social History   Tobacco Use   Smoking status: Former    Packs/day: 1.50    Years: 40.00    Total pack years: 60.00    Types: Cigarettes    Start date: 11/04/1963    Quit date: 10/29/2003    Years since quitting: 19.0    Passive exposure: Never   Smokeless tobacco: Never  Vaping Use   Vaping Use: Never used  Substance Use Topics   Alcohol use: Yes    Alcohol/week: 15.0 standard drinks of alcohol    Types: 14 Cans of beer, 1 Standard drinks or equivalent per week  Comment: 2 beers or a mixed drink   Drug use: No       OPHTHALMIC EXAM:  Base Eye Exam     Visual Acuity (Snellen - Linear)       Right Left   Dist cc 20/20 20/25   Dist ph cc  NI    Correction: Glasses         Tonometry (Tonopen, 8:07 AM)       Right Left   Pressure 13 15         Pupils       Pupils Dark Light Shape React APD   Right PERRL 3 2 Round Brisk None   Left PERRL 3 2 Round Brisk None         Visual Fields       Left Right    Full Full         Extraocular Movement       Right Left    Full, Ortho Full, Ortho         Neuro/Psych     Oriented x3: Yes   Mood/Affect: Normal         Dilation     Both eyes: 2.5% Phenylephrine @ 8:05 AM           Slit Lamp and Fundus Exam     External Exam       Right Left   External brow ptosis brow ptosis; mild periorbital edema and erythema         Slit Lamp Exam       Right Left   Lids/Lashes Dermatochalasis - upper lid, mild Meibomian gland dysfunction Dermatochalasis, Telangiectasia, Meibomian gland dysfunction, Ptosis   Conjunctiva/Sclera Nasal and temporal Pinguecula temporal Pinguecula, ahmed plate and scleral patch graft ST quad -- nicely healed, bleb ST conj   Cornea well healed cataract wound, trace PEE well healed cataract wound, mild tear film debris   Anterior Chamber deep and clear Deep and clear, tube at 0100, No cell or pigment   Iris Round and dilated, No NVI Round and  dilated, No NVI   Lens Posterior chamber intraocular lens in good position sutured Akreos IOL in good position   Anterior Vitreous vitreous syneresis post vitrectomy, clear         Fundus Exam       Right Left   Disc Pink and Sharp, Compact Pink and Sharp, Compact, mild temporal PPA   C/D Ratio 0.2 0.2   Macula Flat, good foveal reflex, mild RPE mottling, No heme or edema Flat. blunted foveal reflex, central cystic changes - stably improved, mild RPE mottling, No heme   Vessels mild attenuation, mild tortuosity attenuated, mild tortuosity   Periphery Attached, round focal area of CR atrophy at 0630 mid zone Attached, No heme           Refraction     Wearing Rx       Sphere Cylinder Axis   Right +0.50 +1.00 178   Left -2.75 +2.50 175           IMAGING AND PROCEDURES  Imaging and Procedures for '@TODAY'$ @  OCT, Retina - OU - Both Eyes       Right Eye Quality was good. Central Foveal Thickness: 271. Progression has been stable. Findings include normal foveal contour, no IRF, no SRF (partial PVD - stable).   Left Eye Quality was good. Central Foveal Thickness: 289. Progression has been stable. Findings include normal foveal contour, no IRF, no SRF (stable  resolution of central cystic changes / CME).   Notes *Images captured and stored on drive  Diagnosis / Impression:  OD: NFP, no IRF, no SRF; partial PVD OS: stable resolution of central cystic changes / CME -- NFP; no IRF/SRF   Clinical management:  See below  Abbreviations: NFP - Normal foveal profile. CME - cystoid macular edema. PED - pigment epithelial detachment. IRF - intraretinal fluid. SRF - subretinal fluid. EZ - ellipsoid zone. ERM - epiretinal membrane. ORA - outer retinal atrophy. ORT - outer retinal tubulation. SRHM - subretinal hyper-reflective material            ASSESSMENT/PLAN:    ICD-10-CM   1. Uveitis-hyphema-glaucoma syndrome of left eye  T85.398A OCT, Retina - OU - Both Eyes   H20.9     H40.42X0     2. 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 Pigmentary glaucoma of left eye, mild stage  H40.1321     4. Dislocated IOL (intraocular lens), anterior, left  H27.122     5. Cystoid macular edema of left eye  H35.352     6. Pseudophakia of both eyes  Z96.1     7. Lyme disease  A69.20     8. Dermatochalasis of both upper eyelids  H02.831    H02.834     9. Brow ptosis  H57.819       1-4. UGH Syndrome OS  - originally: 3 piece PCIOL OS centered, but iris has large TID in sup temp quadrant with IOL haptic within area  - tmax at [facility] 39 OS and was started on max drops (Cosopt, Brim, latan, and po diamox)  - repeat gonio showed open angles OS, mild focal PAS  - s/p PPV w/ IOL exchange / sutured secondary Akreos OS IOL 04.08.21             - today, doing well, BCVA OS 20/25   - OCT shows stable resolution of CME/IRF             - IOP 15 OS  - s/p Ahmed Valve OS on 1.18.22 ([place])  - now released from Dr. Duayne Cal care  - monitor   5. CME OS -- recurrent  - mild CME that was improved on 7.21.21  - interval re-development of CME  06.21.23 (prior recurrences 08.18.21 and 12.16.22)  - s/p STK OS (08.18.21)  - OCT today (09.06.23) shows stable resolution of central cystic changes / CME  - BCVA stable at 20/25   - cont PF and Prolensa QDaily OS  - f/u 3-4 months DFE, OCT  6. Pseudophakia OU  - s/p CE/IOL OU -- pt can't remember dates, one eye by [facility], other eye by [facility]  - h/o UGH OS and now sutured IOL as above  - PCIOL OD appears to be in good position  - sutured Akreos IOL OS in good position  - monitor  7. Lyme disease  - history of diagnosis from target lesions on lower extremities  - no blood test confirmation performed   - completed doxycycline per PCP  - no manifestations in the eye, but will continue to monitor  8,9. Brow ptosis, dermatochalasis OU  - referred to Dr. Vickki Muff (now at [facility]) for eyelid eval  - holding on repair for  now  Ophthalmic Meds Ordered this visit:  No orders of the defined types were placed in this encounter.    Return for f/u 3-4 months, UGH syndrome OS, DFE, OCT.  There are no Patient Instructions on file for this visit.  This document serves as a record of services personally performed by Gardiner Sleeper, MD, PhD. It was created on their behalf by Roselee Nova, COMT. The creation of this record is the provider's dictation and/or activities during the visit.  Electronically signed by: Roselee Nova, COMT 11/03/22 1:21 AM  This document serves as a record of services personally performed by Gardiner Sleeper, MD, PhD. It was created on their behalf by San Jetty. Owens Shark, OA an ophthalmic technician. The creation of this record is the provider's dictation and/or activities during the visit.    Electronically signed by: San Jetty. Owens Shark, [place] 11.06.2023 1:21 AM   Gardiner Sleeper, M.D., Ph.D. Diseases & Surgery of the Retina and Vitreous [facility]  I have reviewed the above documentation for accuracy and completeness, and I agree with the above. Gardiner Sleeper, M.D., Ph.D. 11/03/22 1:23 AM   Abbreviations: M myopia (nearsighted); A astigmatism; H hyperopia (farsighted); P presbyopia; Mrx spectacle prescription;  CTL contact lenses; OD right eye; OS left eye; OU both eyes  XT exotropia; ET esotropia; PEK punctate epithelial keratitis; PEE punctate epithelial erosions; DES dry eye syndrome; MGD meibomian gland dysfunction; ATs artificial tears; PFAT's preservative free artificial tears; Ama nuclear sclerotic cataract; PSC posterior subcapsular cataract; ERM epi-retinal membrane; PVD posterior vitreous detachment; RD retinal detachment; DM diabetes mellitus; DR diabetic retinopathy; NPDR non-proliferative diabetic retinopathy; PDR proliferative diabetic retinopathy; CSME clinically significant macular edema; DME diabetic macular edema; dbh dot blot hemorrhages; CWS cotton wool  spot; POAG primary open angle glaucoma; C/D cup-to-disc ratio; HVF humphrey visual field; GVF goldmann visual field; OCT optical coherence tomography; IOP intraocular pressure; BRVO Branch retinal vein occlusion; CRVO central retinal vein occlusion; CRAO central retinal artery occlusion; BRAO branch retinal artery occlusion; RT retinal tear; SB scleral buckle; PPV pars plana vitrectomy; VH Vitreous hemorrhage; PRP panretinal laser photocoagulation; IVK intravitreal kenalog; VMT vitreomacular traction; MH Macular hole;  NVD neovascularization of the disc; NVE neovascularization elsewhere; AREDS age related eye disease study; ARMD age related macular degeneration; POAG primary open angle glaucoma; EBMD epithelial/anterior basement membrane dystrophy; ACIOL anterior chamber intraocular lens; IOL intraocular lens; PCIOL posterior chamber intraocular lens; Phaco/IOL phacoemulsification with intraocular lens placement; [place] photorefractive keratectomy; LASIK laser assisted in situ keratomileusis; HTN hypertension; DM diabetes mellitus; COPD chronic obstructive pulmonary disease

## 2022-11-02 ENCOUNTER — Ambulatory Visit (INDEPENDENT_AMBULATORY_CARE_PROVIDER_SITE_OTHER): Payer: Medicare Other | Admitting: Ophthalmology

## 2022-11-02 DIAGNOSIS — H57819 Brow ptosis, unspecified: Secondary | ICD-10-CM | POA: Diagnosis not present

## 2022-11-02 DIAGNOSIS — H401321 Pigmentary glaucoma, left eye, mild stage: Secondary | ICD-10-CM | POA: Diagnosis not present

## 2022-11-02 DIAGNOSIS — H209 Unspecified iridocyclitis: Secondary | ICD-10-CM

## 2022-11-02 DIAGNOSIS — H27122 Anterior dislocation of lens, left eye: Secondary | ICD-10-CM | POA: Diagnosis not present

## 2022-11-02 DIAGNOSIS — H02834 Dermatochalasis of left upper eyelid: Secondary | ICD-10-CM | POA: Diagnosis not present

## 2022-11-02 DIAGNOSIS — H02831 Dermatochalasis of right upper eyelid: Secondary | ICD-10-CM | POA: Diagnosis not present

## 2022-11-02 DIAGNOSIS — Z961 Presence of intraocular lens: Secondary | ICD-10-CM

## 2022-11-02 DIAGNOSIS — H35352 Cystoid macular degeneration, left eye: Secondary | ICD-10-CM | POA: Diagnosis not present

## 2022-11-02 DIAGNOSIS — H4042X0 Glaucoma secondary to eye inflammation, left eye, stage unspecified: Secondary | ICD-10-CM | POA: Diagnosis not present

## 2022-11-02 DIAGNOSIS — T85398A Other mechanical complication of other ocular prosthetic devices, implants and grafts, initial encounter: Secondary | ICD-10-CM

## 2022-11-02 DIAGNOSIS — A6920 Lyme disease, unspecified: Secondary | ICD-10-CM

## 2022-11-03 ENCOUNTER — Encounter (INDEPENDENT_AMBULATORY_CARE_PROVIDER_SITE_OTHER): Payer: Self-pay | Admitting: Ophthalmology

## 2022-11-04 DIAGNOSIS — E039 Hypothyroidism, unspecified: Secondary | ICD-10-CM | POA: Diagnosis not present

## 2022-11-04 DIAGNOSIS — E1169 Type 2 diabetes mellitus with other specified complication: Secondary | ICD-10-CM | POA: Diagnosis not present

## 2022-11-09 DIAGNOSIS — E1151 Type 2 diabetes mellitus with diabetic peripheral angiopathy without gangrene: Secondary | ICD-10-CM | POA: Diagnosis not present

## 2022-11-09 DIAGNOSIS — I25119 Atherosclerotic heart disease of native coronary artery with unspecified angina pectoris: Secondary | ICD-10-CM | POA: Diagnosis not present

## 2022-11-09 DIAGNOSIS — M109 Gout, unspecified: Secondary | ICD-10-CM | POA: Diagnosis not present

## 2022-11-09 DIAGNOSIS — D696 Thrombocytopenia, unspecified: Secondary | ICD-10-CM | POA: Diagnosis not present

## 2022-11-09 DIAGNOSIS — E039 Hypothyroidism, unspecified: Secondary | ICD-10-CM | POA: Diagnosis not present

## 2022-11-09 DIAGNOSIS — I10 Essential (primary) hypertension: Secondary | ICD-10-CM | POA: Diagnosis not present

## 2022-11-23 ENCOUNTER — Ambulatory Visit: Payer: Medicare Other | Admitting: Cardiology

## 2022-12-02 DIAGNOSIS — L409 Psoriasis, unspecified: Secondary | ICD-10-CM | POA: Diagnosis not present

## 2022-12-02 DIAGNOSIS — L570 Actinic keratosis: Secondary | ICD-10-CM | POA: Diagnosis not present

## 2022-12-17 ENCOUNTER — Telehealth: Payer: Self-pay | Admitting: Cardiology

## 2022-12-17 ENCOUNTER — Ambulatory Visit: Payer: Medicare Other | Attending: Cardiology

## 2022-12-17 DIAGNOSIS — I6523 Occlusion and stenosis of bilateral carotid arteries: Secondary | ICD-10-CM

## 2022-12-17 MED ORDER — DAPAGLIFLOZIN PROPANEDIOL 10 MG PO TABS: 10.0000 mg | ORAL_TABLET | Freq: Every day | ORAL | 6 refills | Status: DC

## 2022-12-17 NOTE — Telephone Encounter (Signed)
     1. Which medications need to be refilled? (please list name of each medication and dose if known) dapagliflozin propanediol (FARXIGA) 10   2. Which pharmacy/location (including street and city if local pharmacy) is medication to be sent to?  [facility]   3. Do they need a 30 day or 90 day supply? [place]

## 2022-12-22 ENCOUNTER — Other Ambulatory Visit: Payer: Self-pay

## 2022-12-22 ENCOUNTER — Telehealth: Payer: Self-pay

## 2022-12-22 MED ORDER — DAPAGLIFLOZIN PROPANEDIOL 10 MG PO TABS: 10.0000 mg | ORAL_TABLET | Freq: Every day | ORAL | 6 refills | Status: DC

## 2022-12-22 NOTE — Telephone Encounter (Signed)
-----   Message from Satira Sark, MD sent at 12/18/2022  4:14 PM EST ----- Results reviewed.  Stable findings with 1 to 39% RICA stenosis and 40 to 07% LICA stenosis.  Continue with medical therapy and would arrange a follow-up carotid Doppler in 1 year.

## 2022-12-22 NOTE — Telephone Encounter (Signed)
Patient notified and verbalized understanding. Patient had no questions or concerns at this time. PCP copied 

## 2022-12-26 ENCOUNTER — Encounter (HOSPITAL_COMMUNITY): Payer: Self-pay | Admitting: Emergency Medicine

## 2022-12-26 ENCOUNTER — Emergency Department (HOSPITAL_COMMUNITY)
Admission: EM | Admit: 2022-12-26 | Discharge: 2022-12-26 | Disposition: A | Discharge status: Home / Self Care | Payer: Medicare Other | Attending: Emergency Medicine | Admitting: Emergency Medicine

## 2022-12-26 ENCOUNTER — Other Ambulatory Visit: Payer: Self-pay

## 2022-12-26 ENCOUNTER — Emergency Department (HOSPITAL_COMMUNITY): Payer: Medicare Other

## 2022-12-26 DIAGNOSIS — Z79899 Other long term (current) drug therapy: Secondary | ICD-10-CM | POA: Diagnosis not present

## 2022-12-26 DIAGNOSIS — Z20822 Contact with and (suspected) exposure to covid-19: Secondary | ICD-10-CM | POA: Diagnosis not present

## 2022-12-26 DIAGNOSIS — I7143 Infrarenal abdominal aortic aneurysm, without rupture: Secondary | ICD-10-CM | POA: Diagnosis not present

## 2022-12-26 DIAGNOSIS — R197 Diarrhea, unspecified: Secondary | ICD-10-CM | POA: Diagnosis not present

## 2022-12-26 DIAGNOSIS — I2510 Atherosclerotic heart disease of native coronary artery without angina pectoris: Secondary | ICD-10-CM | POA: Diagnosis not present

## 2022-12-26 DIAGNOSIS — Z85820 Personal history of malignant melanoma of skin: Secondary | ICD-10-CM | POA: Diagnosis not present

## 2022-12-26 DIAGNOSIS — I5030 Unspecified diastolic (congestive) heart failure: Secondary | ICD-10-CM | POA: Insufficient documentation

## 2022-12-26 DIAGNOSIS — J449 Chronic obstructive pulmonary disease, unspecified: Secondary | ICD-10-CM | POA: Diagnosis not present

## 2022-12-26 DIAGNOSIS — K529 Noninfective gastroenteritis and colitis, unspecified: Secondary | ICD-10-CM | POA: Insufficient documentation

## 2022-12-26 DIAGNOSIS — Z7982 Long term (current) use of aspirin: Secondary | ICD-10-CM | POA: Insufficient documentation

## 2022-12-26 DIAGNOSIS — E039 Hypothyroidism, unspecified: Secondary | ICD-10-CM | POA: Insufficient documentation

## 2022-12-26 DIAGNOSIS — I110 Hypertensive heart disease with heart failure: Secondary | ICD-10-CM | POA: Insufficient documentation

## 2022-12-26 DIAGNOSIS — R001 Bradycardia, unspecified: Secondary | ICD-10-CM | POA: Diagnosis not present

## 2022-12-26 DIAGNOSIS — D696 Thrombocytopenia, unspecified: Secondary | ICD-10-CM | POA: Insufficient documentation

## 2022-12-26 DIAGNOSIS — N281 Cyst of kidney, acquired: Secondary | ICD-10-CM | POA: Diagnosis not present

## 2022-12-26 DIAGNOSIS — E119 Type 2 diabetes mellitus without complications: Secondary | ICD-10-CM | POA: Insufficient documentation

## 2022-12-26 DIAGNOSIS — N200 Calculus of kidney: Secondary | ICD-10-CM | POA: Diagnosis not present

## 2022-12-26 LAB — COMPREHENSIVE METABOLIC PANEL
ALT: 36 U/L (ref 0–44)
AST: 26 U/L (ref 15–41)
Albumin: 4.1 g/dL (ref 3.5–5.0)
Alkaline Phosphatase: 44 U/L (ref 38–126)
Anion gap: 5 (ref 5–15)
BUN: 18 mg/dL (ref 8–23)
CO2: 22 mmol/L (ref 22–32)
Calcium: 8.8 mg/dL — ABNORMAL LOW (ref 8.9–10.3)
Chloride: 109 mmol/L (ref 98–111)
Creatinine, Ser: 0.83 mg/dL (ref 0.61–1.24)
GFR, Estimated: >60 mL/min (ref >60)
Glucose, Bld: 101 mg/dL — ABNORMAL HIGH (ref 70–99)
Potassium: 4 mmol/L (ref 3.5–5.1)
Sodium: 136 mmol/L (ref 135–145)
Total Bilirubin: 1 mg/dL (ref 0.3–1.2)
Total Protein: 6.9 g/dL (ref 6.5–8.1)

## 2022-12-26 LAB — URINALYSIS, ROUTINE W REFLEX MICROSCOPIC
Bacteria, UA: NONE SEEN
Bilirubin Urine: NEGATIVE
Glucose, UA: >=500 mg/dL — AB
Hgb urine dipstick: NEGATIVE
Ketones, ur: NEGATIVE mg/dL
Leukocytes,Ua: NEGATIVE
Nitrite: NEGATIVE
Protein, ur: NEGATIVE mg/dL
Specific Gravity, Urine: 1.026 (ref 1.005–1.030)
pH: 5 (ref 5.0–8.0)

## 2022-12-26 LAB — CBC
HCT: 46.3 % (ref 39.0–52.0)
Hemoglobin: 15.8 g/dL (ref 13.0–17.0)
MCH: 31.5 pg (ref 26.0–34.0)
MCHC: 34.1 g/dL (ref 30.0–36.0)
MCV: 92.2 fL (ref 80.0–100.0)
Platelets: 136 10*3/uL — ABNORMAL LOW (ref 150–400)
RBC: 5.02 MIL/uL (ref 4.22–5.81)
RDW: 13.2 % (ref 11.5–15.5)
WBC: 5.5 10*3/uL (ref 4.0–10.5)
nRBC: 0 % (ref 0.0–0.2)

## 2022-12-26 LAB — RESP PANEL BY RT-PCR (RSV, FLU A&B, COVID)  RVPGX2
Influenza A by PCR: NEGATIVE
Influenza B by PCR: NEGATIVE
Resp Syncytial Virus by PCR: NEGATIVE
SARS Coronavirus 2 by RT PCR: NEGATIVE

## 2022-12-26 LAB — LIPASE, BLOOD: Lipase: 29 U/L (ref 11–51)

## 2022-12-26 MED ORDER — IOHEXOL 300 MG/ML  SOLN
100.0000 mL | Freq: Once | INTRAMUSCULAR | Status: AC | PRN
  Administered 2022-12-26: 100 mL via INTRAVENOUS

## 2022-12-26 MED ORDER — ONDANSETRON 4 MG PO TBDP: 4.0000 mg | ORAL_TABLET | Freq: Three times a day (TID) | ORAL | 0 refills | Status: DC | PRN

## 2022-12-26 MED ORDER — LACTATED RINGERS IV BOLUS
500.0000 mL | Freq: Once | INTRAVENOUS | Status: AC
  Administered 2022-12-26: 500 mL via INTRAVENOUS

## 2022-12-26 NOTE — ED Provider Notes (Signed)
[facility] EMERGENCY DEPARTMENT Provider Note   CSN: [id number] Arrival date & time: 12/26/22  1141     History  Chief Complaint  Patient presents with   Diarrhea    Christopher Randolph is a 76 y.o. male with CAD, PAD, HTN, hypothyroidism, HFpEF, COPD, T2DM, chronic resp failure w/ hypoxia, h/o TIA, lumbar stenosis presents with diarrhea.   Nausea, watery diarrhea x 6 days. Tried kayopectate which helped for a bit but then came back. Yesterday got worse again. Intermittent abdominal cramping but no pain right now. Had 3 episodes of diarrhea this morning about an hour after eating breakfast. Hasn't had any episodes of diarrhea since then. Earlier in the week had many more episodes than that per day. Has lost 7 pounds since Monday. Endorses generalized weakness and some lightheadedness with standing intermittently. Denies fever/chills, syncope, chest pain, SOB, palpitations, abdominal pain, melena/hematochezia, urinary symptoms. No recent antibiotics, no travel.    Diarrhea      Home Medications Prior to Admission medications   Medication Sig Start Date End Date Taking? Authorizing Provider  acetaminophen (TYLENOL) 325 MG tablet Take 650 mg by mouth every 6 (six) hours as needed.   Yes [provider]  aspirin EC 81 MG tablet Take 81 mg by mouth daily. Swallow whole.   Yes [provider]  atorvastatin (LIPITOR) 20 MG tablet TAKE 1 TABLET BY MOUTH DAILY Patient taking differently: Take 20 mg by mouth daily. 06/03/22  Yes Satira Sark, MD  Bromfenac Sodium (PROLENSA) 0.07 % SOLN Place 1 drop into the left eye daily. 09/02/22  Yes [name], MD  clopidogrel (PLAVIX) 75 MG tablet Take 1 tablet (75 mg total) by mouth daily. 02/28/22 02/28/23 Yes Kathie Dike, MD  colchicine 0.6 MG tablet Take 0.6 mg by mouth once a week.   Yes [provider]  dapagliflozin propanediol (FARXIGA) 10 MG TABS tablet Take 1 tablet (10 mg total) by mouth daily before breakfast.  12/22/22  Yes Satira Sark, MD  fluticasone [facility]) 50 MCG/ACT nasal spray Place 2 sprays into both nostrils daily.   Yes [provider]  furosemide (LASIX) 20 MG tablet Take 1 tablet (20 mg total) by mouth every other day. 08/24/22  Yes Satira Sark, MD  latanoprost (XALATAN) 0.005 % ophthalmic solution Place 1 drop into the left eye at bedtime. 02/11/22  Yes [provider]  levothyroxine (SYNTHROID, LEVOTHROID) 150 MCG tablet Take 150 mcg by mouth See admin instructions. Alternates taking 150 mg daily for 2 days then 175 mcg the third day then repeat 11/01/18  Yes [provider]  lisinopril (ZESTRIL) 10 MG tablet Take 10 mg by mouth daily.   Yes [provider]  mometasone (ELOCON) 0.1 % ointment Apply 1 application. topically at bedtime.   Yes [provider]  ondansetron (ZOFRAN-ODT) 4 MG disintegrating tablet Take 1 tablet (4 mg total) by mouth every 8 (eight) hours as needed for nausea or vomiting. 12/26/22  Yes Audley Hose, MD  prednisoLONE acetate (PRED FORTE) 1 % ophthalmic suspension Place 1 drop into the left eye in the morning.   Yes [provider]  tamsulosin (FLOMAX) 0.4 MG CAPS capsule Take 0.8 mg by mouth at bedtime.   Yes [provider]  tiZANidine (ZANAFLEX) 2 MG tablet Take 2 mg by mouth every 6 (six) hours as needed.   Yes [provider]  amLODipine (NORVASC) 10 MG tablet Take 1 tablet by mouth daily.    [provider]      Allergies    Erythromycin, Shellfish allergy, Azithromycin, and Nitrofuran derivatives    Review of Systems   Review of Systems  Gastrointestinal:  Positive for diarrhea.   Review of systems Negative for f/c.  A 10 point review of systems was performed and is negative unless otherwise reported in HPI.  Physical Exam Updated Vital Signs BP 139/60   Pulse 67   Temp 98 F (36.7 C) (Oral)   Resp 16   Ht '5\' 8"'$  (1.727 m)   Wt 73.9 kg   SpO2 99%    BMI 24.78 kg/m  Physical Exam General: Normal appearing male, lying in bed.  HEENT: Sclera anicteric, MMM, trachea midline.  Cardiology: RRR, no murmurs/rubs/gallops. BL radial and DP pulses equal bilaterally.  Resp: Normal respiratory rate and effort. CTAB, no wheezes, rhonchi, crackles.  Abd: General tenderness to mild palpation throughout the abdomen. Soft, non-distended. No rebound tenderness or guarding.  GU: Deferred. MSK: No peripheral edema or signs of trauma. Extremities without deformity or TTP. No cyanosis or clubbing. Skin: warm, dry. No rashes or lesions. Neuro: A&Ox4, CNs II-XII grossly intact. MAEs. Sensation grossly intact.  Psych: Normal mood and affect.   ED Results / Procedures / Treatments   Labs (all labs ordered are listed, but only abnormal results are displayed) Labs Reviewed  COMPREHENSIVE METABOLIC PANEL - Abnormal; Notable for the following components:      Result Value   Glucose, Bld 101 (*)    Calcium 8.8 (*)    All other components within normal limits  CBC - Abnormal; Notable for the following components:   Platelets 136 (*)    All other components within normal limits  URINALYSIS, ROUTINE W REFLEX MICROSCOPIC - Abnormal; Notable for the following components:   Glucose, UA >=500 (*)    All other components within normal limits  RESP PANEL BY RT-PCR (RSV, FLU A&B, COVID)  [id number]  LIPASE, BLOOD    EKG EKG Interpretation  Date/Time:  Saturday December 26 2022 13:21:49 EST Ventricular Rate:  56 PR Interval:  168 QRS Duration: 88 QT Interval:  420 QTC Calculation: 405 R Axis:   45 Text Interpretation: Sinus bradycardia Nonspecific T wave abnormality  Unchanged from prior When compared with ECG of 28-May-2022 05:59, No significant change was found Confirmed by Cindee Lame [phone]) on 12/26/2022 4:37:29 PM  Radiology CT ABDOMEN PELVIS W CONTRAST  Result Date: 12/26/2022 CLINICAL DATA:  General abdominal pain nausea EXAM: CT ABDOMEN AND PELVIS  WITH CONTRAST TECHNIQUE: Multidetector CT imaging of the abdomen and pelvis was performed using the standard protocol following bolus administration of intravenous contrast. RADIATION DOSE REDUCTION: This exam was performed according to the departmental dose-optimization program which includes automated exposure control, adjustment of the mA and/or kV according to patient size and/or use of iterative reconstruction technique. CONTRAST:  193m OMNIPAQUE IOHEXOL 300 MG/ML  SOLN COMPARISON:  CT 11/09/2003 FINDINGS: Lower chest: Lung bases demonstrate no acute airspace disease. Mild chronic elevation of right diaphragm. Hepatobiliary: Subcentimeter hypodensities in the liver too small to further characterize. No calcified gallstone or biliary dilatation Pancreas: Unremarkable. No pancreatic ductal dilatation or surrounding inflammatory changes. Spleen: Normal in size without focal abnormality. Adrenals/Urinary Tract: Adrenal glands are normal. No hydronephrosis. Scarring in the lower pole of the right kidney. Nonobstructing right kidney stone. Cysts and subcentimeter hypodensities too small to further characterize, no follow-up recommended. The bladder is unremarkable Stomach/Bowel: The stomach is nonenlarged. Fluid-filled large and small bowel without obstruction or bowel  wall thickening. Diverticular disease of the colon. Negative appendix. Vascular/Lymphatic: Advanced aortic atherosclerosis. Stent in the left common iliac artery. Infrarenal abdominal aortic diameter up to 3.1 cm. No suspicious lymph nodes. Reproductive: Slightly enlarged prostate Other: Negative for pelvic effusion or free air. Small fat containing umbilical hernia Musculoskeletal: No acute osseous abnormality. Lumbar fusion hardware L3 through S1. IMPRESSION: 1. Fluid-filled large and small bowel without obstruction or bowel wall thickening, findings could be secondary to enteritis. 2. 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 3. Nonobstructing right kidney stone. Scarring lower pole right kidney. 4. 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 Recommend follow-up ultrasound every 3 years. This recommendation follows ACR consensus guidelines: White Paper of the ACR Incidental Findings Committee II on Vascular Findings. J Am Coll Radiol 2013; 85:277-824. 5. Aortic atherosclerosis. Aortic Atherosclerosis (ICD10-I70.0). Electronically Signed   By: Donavan Foil M.D.   On: 12/26/2022 19:59    Procedures Procedures    Medications Ordered in ED Medications  lactated ringers bolus 500 mL (0 mLs Intravenous Stopped 12/26/22 1959)  iohexol (OMNIPAQUE) 300 MG/ML solution 100 mL (100 mLs Intravenous Contrast Given 12/26/22 1935)    ED Course/ Medical Decision Making/ A&P                          Medical Decision Making Amount and/or Complexity of Data Reviewed Labs: ordered. Decision-making details documented in ED Course. Radiology: ordered. Decision-making details documented in ED Course.  Risk Prescription drug management.    This patient presents to the ED for concern of diarrhea, this involves an extensive number of treatment options, and is a complaint that carries with it a high risk of complications and morbidity.  I considered the following differential and admission for this acute condition. Overall patient is well appearing and HDS.  MDM:    For ddx of acute diarrhea, consider:  -Lower c/f bacterial diarrhea given no fevers/chills, melena/hematochezia, or leukocytosis.  -Viral causes such as gastroenteritis d/t rotavirus or norovirus -Noninfectious causes include intraabdominal infections such as appendicitis or diverticulitis, SBO, or mesenteric ischemia.  He has no fevers, tachycardia, leukocytosis to suggest sepsis. -No recent antibiotic use or hospitalizations to suggest c diff. -Consider electrolyte abnormalities or dehydration given patient reporting generalized  weakness/fatigue/lightheadedness with standing. Gratefully no AKI or lyte abnormalities. Will give small fluid bolus given report of lightheadedness.   Given abdominal pain will obtain CT abdomen pelvis to assess. Will treat with small fluid bolus and reevaluate.    Clinical Course as of 12/26/22 2041  Sat Dec 26, 2022  1636 Comprehensive metabolic panel(!) Unremarkable [HN]  1636 Platelets(!): 136 Mild thrombocytopenia [HN]  1636 Hemoglobin: 15.8 [HN]  1636 WBC: 5.5 [HN]  1636 Lipase: 29 [HN]  1636 Resp panel by RT-PCR (RSV, Flu A&B, Covid) Anterior Nasal Swab Neg [HN]  1636 Urinalysis, Routine w reflex microscopic Urine, Clean Catch(!) Glucosuria but otherwise unremarkable [HN]  2008 CT ABDOMEN PELVIS W CONTRAST 1. Fluid-filled large and small bowel without obstruction or bowel wall thickening, findings could be secondary to enteritis. 2. Diverticular disease of the colon without acute inflammatory process. 3. Nonobstructing right kidney stone. Scarring lower pole right kidney. 4. 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 Recommend follow-up ultrasound every 3 years. This recommendation follows ACR consensus guidelines: White Paper of the ACR Incidental Findings Committee II on Vascular Findings. J Am Coll Radiol 2013; 23:536-144. 5. Aortic atherosclerosis. [HN]    Clinical Course  User Index [HN] Audley Hose, MD    Labs: I Ordered, and personally interpreted labs.  The pertinent results include:  those listed above  Additional history obtained from wife at bedside.    Cardiac Monitoring: The patient was maintained on a cardiac monitor.  I personally viewed and interpreted the cardiac monitored which showed an underlying rhythm of: NSR  Reevaluation: After the interventions noted above, I reevaluated the patient and found that they have :improved  Social Determinants of Health: Patient lives independently   Disposition:  Patient is reevaluated  and feels improved after fluids. He has had no further episodes of diarrhea while here in the ED. He is overall well-appearing and hemodynamically stable, labs reassuring. Do not believe that patient requires admission or further w/u/testing for his diarrhea at this time. CT abd pelvis shows likely enteritis, possibly viral in origin.  Patient is also informed of the other findings on the CT scan.  Patient will discharged with instructions to utilize loperamide per instructions on the label for no more than 2 to 3 days for his symptoms.  Also instructed to stay well-hydrated and will give a Zofran prescription for his nausea so that he can be sure to drink fluids.  Patient instructed to follow-up with his primary care physician within the next 1 to 2 weeks and is given extensive discharge instructions and return precautions.  All questions answered to patient satisfaction.   Co morbidities that complicate the patient evaluation  Past Medical History:  Diagnosis Date   Arthritis    CAD (coronary artery disease)    Branch vessel and moderate RCA disease 2006   Cancer ([place]) 1990   Melanoma Lower right Leg   Carotid artery disease (HCC)    COPD (chronic obstructive pulmonary disease) ([place])    Essential hypertension    Glaucoma    Hyperlipidemia    Hypothyroidism    Lyme disease    PAD (peripheral artery disease) ([place])    Left common iliac stent 2004   Pneumonia    TIA (transient ischemic attack) 02/2022   Type 2 diabetes mellitus (HCC)    Wears dentures    Wears glasses      Medicines Meds ordered this encounter  Medications   lactated ringers bolus 500 mL   iohexol (OMNIPAQUE) 300 MG/ML solution 100 mL   ondansetron (ZOFRAN-ODT) 4 MG disintegrating tablet    Sig: Take 1 tablet (4 mg total) by mouth every 8 (eight) hours as needed for nausea or vomiting.    Dispense:  20 tablet    Refill:  0    I have reviewed the patients home medicines and have made adjustments as needed  Problem  List / ED Course: Problem List Items Addressed This Visit   None Visit Diagnoses     Diarrhea, unspecified type    -  Primary   Infrarenal abdominal aortic aneurysm (AAA) without rupture (HCC)       Relevant Medications   amLODipine (NORVASC) 10 MG tablet   Gastroenteritis                       This note was created using dictation software, which may contain spelling or grammatical errors.    Audley Hose, MD 12/26/22 [phone]

## 2022-12-26 NOTE — Discharge Instructions (Addendum)
Thank you for coming to [facility] Emergency Department. You were seen for diarrhea and abdominal cramping. We did an exam, labs, and imaging, and these showed likely gastroenteritis, or a viral infection of your intestine. Your CT of your abdomen read as below:  1. Fluid-filled large and small bowel without obstruction or bowel  wall thickening, findings could be secondary to enteritis.  2. 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  3. Nonobstructing right kidney stone. Scarring lower pole right  kidney.  4. 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  Recommend follow-up ultrasound every 3 years. This recommendation  follows ACR consensus guidelines: White Paper of the ACR Incidental  Findings Committee II on Vascular Findings. J Am Coll Radiol 2013;  95:284-132.  5. Aortic atherosclerosis.   You will need to have follow up ultrasound of your abdominal aorta. This can be managed with your primary care physician.   You can take loperamide (imodium) for your symptoms as instructed on the label for no more than 2-3 days. Stay well hydrated at home. We have also prescribed zofran under the tongue tablets to take once every 6-8 hours as needed for nausea. Please follow up with your primary care provider within 1 week.   Do not hesitate to return to the ED or call 911 if you experience: -Worsening symptoms -Worsening abdominal pain -Bloody diarrhea -Lightheadedness, passing out -Fevers/chills -Anything else that concerns you

## 2022-12-26 NOTE — ED Triage Notes (Signed)
Pt states heart burn yesterday and today. Hx of CHF lost 7 lbs this week.

## 2022-12-26 NOTE — ED Triage Notes (Signed)
Nausea, abd pain, Diarrhea x 6 days. Denies fever.

## 2022-12-29 DIAGNOSIS — K5790 Diverticulosis of intestine, part unspecified, without perforation or abscess without bleeding: Secondary | ICD-10-CM | POA: Diagnosis not present

## 2022-12-29 DIAGNOSIS — R030 Elevated blood-pressure reading, without diagnosis of hypertension: Secondary | ICD-10-CM | POA: Diagnosis not present

## 2022-12-29 DIAGNOSIS — E1151 Type 2 diabetes mellitus with diabetic peripheral angiopathy without gangrene: Secondary | ICD-10-CM | POA: Diagnosis not present

## 2022-12-29 DIAGNOSIS — I714 Abdominal aortic aneurysm, without rupture, unspecified: Secondary | ICD-10-CM | POA: Diagnosis not present

## 2022-12-29 DIAGNOSIS — N200 Calculus of kidney: Secondary | ICD-10-CM | POA: Diagnosis not present

## 2022-12-29 DIAGNOSIS — K529 Noninfective gastroenteritis and colitis, unspecified: Secondary | ICD-10-CM | POA: Diagnosis not present

## 2022-12-29 DIAGNOSIS — I700 Atherosclerosis of aorta: Secondary | ICD-10-CM | POA: Diagnosis not present

## 2023-01-11 ENCOUNTER — Other Ambulatory Visit: Payer: Self-pay | Admitting: Cardiology

## 2023-01-11 DIAGNOSIS — I6523 Occlusion and stenosis of bilateral carotid arteries: Secondary | ICD-10-CM

## 2023-01-19 NOTE — Progress Notes (Signed)
[facility] Note  02/02/2023     CHIEF COMPLAINT Patient presents for Retina Follow Up  HISTORY OF PRESENT ILLNESS: Christopher Randolph is a 77 y.o. male who presents to the clinic today for:   HPI     Retina Follow Up   Patient presents with  Other.  In left eye.  Severity is moderate.  Duration of 3 months.  Since onset it is stable.  I, the attending physician,  performed the HPI with the patient and updated documentation appropriately.        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 Pt states VA is 'okay' feels like its good some days and worse on others. Reports maintaining his gtts regimen  Pred and Prolensa QD OS.       Last edited by [name], MD on 02/02/2023 12:23 PM.     Pt states his left eye feels okay, but vision is still "fuzzy", pt states he can't see very well out of his glasses, but he will get a new rx from Dr. Katy Randolph this year  Referring physician: [name], MD [address] [place],  [place] [id number]  HISTORICAL INFORMATION:   Selected notes from the MEDICAL RECORD NUMBER Referred by Dr. [name] for concern of vitreous hemorrhage   CURRENT MEDICATIONS: Current Outpatient Medications (Ophthalmic Drugs)  Medication Sig   Bromfenac Sodium (PROLENSA) 0.07 % SOLN Place 1 drop into the left eye daily.   latanoprost (XALATAN) 0.005 % ophthalmic solution Place 1 drop into the left eye at bedtime.   prednisoLONE acetate (PRED FORTE) 1 % ophthalmic suspension Place 1 drop into the left eye in the morning.   No current facility-administered medications for this visit. (Ophthalmic Drugs)   Current Outpatient Medications (Other)  Medication Sig   acetaminophen (TYLENOL) 325 MG tablet Take 650 mg by mouth every 6 (six) hours as needed.   amLODipine (NORVASC) 10 MG tablet Take 1 tablet by mouth daily.   aspirin EC 81 MG tablet Take 81 mg by mouth daily. Swallow whole.   atorvastatin (LIPITOR) 20 MG  tablet TAKE 1 TABLET BY MOUTH DAILY (Patient taking differently: Take 20 mg by mouth daily.)   clopidogrel (PLAVIX) 75 MG tablet Take 1 tablet (75 mg total) by mouth daily.   colchicine 0.6 MG tablet Take 0.6 mg by mouth once a week.   dapagliflozin propanediol (FARXIGA) 10 MG TABS tablet Take 1 tablet (10 mg total) by mouth daily before breakfast.   fluticasone (FLONASE) 50 MCG/ACT nasal spray Place 2 sprays into both nostrils daily.   furosemide (LASIX) 20 MG tablet Take 1 tablet (20 mg total) by mouth every other day.   levothyroxine (SYNTHROID, LEVOTHROID) 150 MCG tablet Take 150 mcg by mouth See admin instructions. Alternates taking 150 mg daily for 2 days then 175 mcg the third day then repeat   lisinopril (ZESTRIL) 10 MG tablet Take 10 mg by mouth daily.   mometasone (ELOCON) 0.1 % ointment Apply 1 application. topically at bedtime.   ondansetron (ZOFRAN-ODT) 4 MG disintegrating tablet Take 1 tablet (4 mg total) by mouth every 8 (eight) hours as needed for nausea or vomiting.   tamsulosin (FLOMAX) 0.4 MG CAPS capsule Take 0.8 mg by mouth at bedtime.   tiZANidine (ZANAFLEX) 2 MG tablet Take 2 mg by mouth every 6 (six) hours as needed.   No current facility-administered medications for this visit. (Other)   REVIEW  OF SYSTEMS: ROS   Positive for: Eyes Negative for: Constitutional, Gastrointestinal, Neurological, Skin, Genitourinary, Musculoskeletal, HENT, Endocrine, Cardiovascular, Respiratory, Psychiatric, Allergic/Imm, Heme/Lymph Last edited by Kingsley Spittle, COT on 02/02/2023  8:05 AM.     ALLERGIES Allergies  Allergen Reactions   Erythromycin Swelling    swelling of lips   Shellfish Allergy Rash and Other (See Comments)    Gout flares   Azithromycin Swelling   Nitrofuran Derivatives    PAST MEDICAL HISTORY Past Medical History:  Diagnosis Date   Arthritis    CAD (coronary artery disease)    Branch vessel and moderate RCA disease 2006   Cancer ([place]) 1990   Melanoma  Lower right Leg   Carotid artery disease ([place])    COPD (chronic obstructive pulmonary disease) ([place])    Essential hypertension    Glaucoma    Hyperlipidemia    Hypothyroidism    Lyme disease    PAD (peripheral artery disease) ([place])    Left common iliac stent 2004   Pneumonia    TIA (transient ischemic attack) 02/2022   Type 2 diabetes mellitus ([place])    Wears dentures    Wears glasses    Past Surgical History:  Procedure Laterality Date   CATARACT EXTRACTION Bilateral    CATARACT EXTRACTION, BILATERAL     CERVICAL DISC SURGERY     x 1   COLONOSCOPY N/A 11/06/2016   Procedure: COLONOSCOPY;  Surgeon: Daneil Dolin, MD;  [facility];  Service: Endoscopy;  Laterality: N/A;  7:30 AM   COLONOSCOPY  2012   Dr. Anthony Sar: normal. reviewed reports, which states he has a history of polyps in remote past.    ELBOW SURGERY Left    EYE SURGERY Bilateral    Cat Sx   EYE SURGERY Left 01/14/2021   [place] - Dr. Jovita Kussmaul   IRIDOTOMY / IRIDECTOMY Left 01/14/2021   [place] - Dr. Jovita Kussmaul   JOINT REPLACEMENT     LUMBAR WOUND DEBRIDEMENT N/A 01/24/2019   Procedure: LUMBAR WOUND Exploration for Evacation of Seroma vs. Hematoma;  Surgeon: Jovita Gamma, MD;  Location: [facility];  Service: Neurosurgery;  Laterality: N/A;   MELANOMA EXCISION     right leg, seen at [facility] and underwent immunotherapy   NECK SURGERY      x 1 yrs ago   PARS PLANA VITRECTOMY Left 04/04/2020   Procedure: PARS PLANA VITRECTOMY WITH 25G REMOVAL/SUTURE INTRAOCULAR LENS;  Surgeon: [name], MD;  Location: [facility];  Service: Ophthalmology;  Laterality: Left;   REPLACEMENT TOTAL KNEE Right    TRANSCAROTID ARTERY REVASCULARIZATION  Right 03/25/2022   Procedure: Right Transcarotid Artery Revascularization;  Surgeon: Serafina Mitchell, MD;  Location: [facility];  Service: Vascular;  Laterality: Right;   WISDOM TOOTH EXTRACTION     FAMILY HISTORY Family History  Problem Relation Age of Onset   Heart disease Mother     Heart attack Mother    Colon polyps Mother    Colon polyps Brother    Heart attack Maternal Grandmother    Heart attack Maternal Grandfather    Colon cancer Neg Hx    SOCIAL HISTORY Social History   Tobacco Use   Smoking status: Former    Packs/day: 1.50    Years: 40.00    Total pack years: 60.00    Types: Cigarettes    Start date: 11/04/1963    Quit date: 10/29/2003    Years since quitting: 19.2    Passive exposure: Never  Smokeless tobacco: Never  Vaping Use   Vaping Use: Never used  Substance Use Topics   Alcohol use: Yes    Alcohol/week: 15.0 standard drinks of alcohol    Types: 14 Cans of beer, 1 Standard drinks or equivalent per week    Comment: 2 beers or a mixed drink   Drug use: No       OPHTHALMIC EXAM:  Base Eye Exam     Visual Acuity (Snellen - Linear)       Right Left   Dist cc 20/20 20/25    Correction: Glasses         Tonometry (Tonopen, 8:15 AM)       Right Left   Pressure 19 20         Pupils       Pupils Dark Light Shape React APD   Right PERRL 3 2 Round Brisk None   Left PERRL 3 2 Round Brisk None         Visual Fields (Counting fingers)       Left Right    Full Full         Extraocular Movement       Right Left    Full, Ortho Full, Ortho         Neuro/Psych     Oriented x3: Yes   Mood/Affect: Normal         Dilation     Both eyes: 1.0% Mydriacyl, 2.5% Phenylephrine @ 8:16 AM           Slit Lamp and Fundus Exam     External Exam       Right Left   External brow ptosis brow ptosis; mild periorbital edema and erythema         Slit Lamp Exam       Right Left   Lids/Lashes Dermatochalasis - upper lid, mild Meibomian gland dysfunction Dermatochalasis, Telangiectasia, Meibomian gland dysfunction, mild Ptosis   Conjunctiva/Sclera Nasal and temporal Pinguecula temporal Pinguecula, ahmed plate and scleral patch graft ST quad -- nicely healed, bleb ST conj   Cornea well healed cataract wound well  healed cataract wound   Anterior Chamber deep and clear Deep and clear, tube at 0100, No cell or pigment   Iris Round and dilated, No NVI Round and dilated, No NVI, +Transillumination defects ST   Lens Posterior chamber intraocular lens in good position sutured Akreos IOL in good position   Anterior Vitreous vitreous syneresis post vitrectomy, clear         Fundus Exam       Right Left   Disc Pink and Sharp, Compact Pink and Sharp, Compact, mild temporal PPA   C/D Ratio 0.2 0.2   Macula Flat, good foveal reflex, mild RPE mottling, No heme or edema Flat. blunted foveal reflex, central cystic changes - stably improved, mild RPE mottling, No heme   Vessels mild attenuation, mild tortuosity attenuated, mild tortuosity   Periphery Attached, round focal area of CR atrophy at 0630 mid zone Attached, No heme           Refraction     Wearing Rx       Sphere Cylinder Axis   Right +0.50 +1.00 178   Left -2.75 +2.50 175           IMAGING AND PROCEDURES  Imaging and Procedures for '@TODAY'$ @  OCT, Retina - OU - Both Eyes       Right Eye Quality was good. Central Foveal Thickness:  269. Progression has been stable. Findings include normal foveal contour, no IRF, no SRF (partial PVD - stable).   Left Eye Quality was good. Central Foveal Thickness: 281. Progression has been stable. Findings include normal foveal contour, no IRF, no SRF (stable resolution of central cystic changes / CME).   Notes *Images captured and stored on drive  Diagnosis / Impression:  OD: NFP, no IRF/SRF; partial PVD OS: stable resolution of central cystic changes / CME -- NFP; no IRF/SRF   Clinical management:  See below  Abbreviations: NFP - Normal foveal profile. CME - cystoid macular edema. PED - pigment epithelial detachment. IRF - intraretinal fluid. SRF - subretinal fluid. EZ - ellipsoid zone. ERM - epiretinal membrane. ORA - outer retinal atrophy. ORT - outer retinal tubulation. SRHM - subretinal  hyper-reflective material            ASSESSMENT/PLAN:    ICD-10-CM   1. Uveitis-hyphema-glaucoma syndrome of left eye  T85.398A OCT, Retina - OU - Both Eyes   H20.9    H40.42X0     2. 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 Pigmentary glaucoma of left eye, mild stage  H40.1321     4. Dislocated IOL (intraocular lens), anterior, left  H27.122     5. Cystoid macular edema of left eye  H35.352     6. Pseudophakia of both eyes  Z96.1     7. Lyme disease  A69.20     8. Dermatochalasis of both upper eyelids  H02.831    H02.834     9. Brow ptosis  H57.819      1-4. UGH Syndrome OS  - originally: 3 piece PCIOL OS centered, but iris has large TID in sup temp quadrant with IOL haptic within area  - tmax at [facility] 39 OS and was started on max drops (Cosopt, Brim, latan, and po diamox)  - repeat gonio showed open angles OS, mild focal PAS  - s/p PPV w/ IOL exchange / sutured secondary Akreos OS IOL 04.08.21             - today, doing well, BCVA OS 20/25   - OCT shows stable resolution of CME/IRF             - IOP 20 OS  - s/p Ahmed Valve OS on 1.18.22 ([place])  - now released from Dr. Duayne Cal care  - monitor   5. CME OS -- recurrent  - mild CME that was improved on 7.21.21  - interval re-development of CME  06.21.23 (prior recurrences 08.18.21 and 12.16.22)  - s/p STK OS (08.18.21)  - OCT today (09.06.23) shows stable resolution of central cystic changes / CME  - BCVA stable at 20/25   - cont PF and Prolensa QDaily OS  - f/u 6 months DFE, OCT  6. Pseudophakia OU  - s/p CE/IOL OU -- pt can't remember dates, one eye by [facility], other eye by [facility]  - h/o UGH OS and now sutured IOL as above  - PCIOL OD appears to be in good position  - sutured Akreos IOL OS in good position  - monitor  7. Lyme disease  - history of diagnosis from target lesions on lower extremities  - no blood test confirmation performed   - completed doxycycline per PCP  - no manifestations  in the eye, but will continue to monitor  8,9. Brow ptosis, dermatochalasis OU  - referred to Dr. Vickki Muff (now at [facility]) for eyelid  eval  - holding on repair for now  Ophthalmic Meds Ordered this visit:  No orders of the defined types were placed in this encounter.    Return in about 6 months (around 08/03/2023) for f/u CME OS, DFE, OCT.  There are no Patient Instructions on file for this visit.  This document serves as a record of services personally performed by Gardiner Sleeper, MD, PhD. It was created on their behalf by Renaldo Reel, [place] an ophthalmic technician. The creation of this record is the provider's dictation and/or activities during the visit.    Electronically signed by:  Renaldo Reel, COT  01.23.24 12:31 PM  This document serves as a record of services personally performed by Gardiner Sleeper, MD, PhD. It was created on their behalf by San Jetty. Owens Shark, OA an ophthalmic technician. The creation of this record is the provider's dictation and/or activities during the visit.    Electronically signed by: San Jetty. Marguerita Merles 02.06.2024 12:31 PM  Gardiner Sleeper, M.D., Ph.D. Diseases & Surgery of the Retina and Vitreous [facility]  I have reviewed the above documentation for accuracy and completeness, and I agree with the above. Gardiner Sleeper, M.D., Ph.D. 02/02/23 12:32 PM   Abbreviations: M myopia (nearsighted); A astigmatism; H hyperopia (farsighted); P presbyopia; Mrx spectacle prescription;  CTL contact lenses; OD right eye; OS left eye; OU both eyes  XT exotropia; ET esotropia; PEK punctate epithelial keratitis; PEE punctate epithelial erosions; DES dry eye syndrome; MGD meibomian gland dysfunction; ATs artificial tears; PFAT's preservative free artificial tears; [place] nuclear sclerotic cataract; PSC posterior subcapsular cataract; ERM epi-retinal membrane; PVD posterior vitreous detachment; RD retinal detachment; DM diabetes mellitus; DR  diabetic retinopathy; NPDR non-proliferative diabetic retinopathy; PDR proliferative diabetic retinopathy; CSME clinically significant macular edema; DME diabetic macular edema; dbh dot blot hemorrhages; CWS cotton wool spot; POAG primary open angle glaucoma; C/D cup-to-disc ratio; HVF humphrey visual field; GVF goldmann visual field; OCT optical coherence tomography; IOP intraocular pressure; BRVO Branch retinal vein occlusion; CRVO central retinal vein occlusion; CRAO central retinal artery occlusion; BRAO branch retinal artery occlusion; RT retinal tear; SB scleral buckle; PPV pars plana vitrectomy; VH Vitreous hemorrhage; PRP panretinal laser photocoagulation; IVK intravitreal kenalog; VMT vitreomacular traction; MH Macular hole;  NVD neovascularization of the disc; NVE neovascularization elsewhere; AREDS age related eye disease study; ARMD age related macular degeneration; POAG primary open angle glaucoma; EBMD epithelial/anterior basement membrane dystrophy; ACIOL anterior chamber intraocular lens; IOL intraocular lens; PCIOL posterior chamber intraocular lens; Phaco/IOL phacoemulsification with intraocular lens placement; [place] photorefractive keratectomy; LASIK laser assisted in situ keratomileusis; HTN hypertension; DM diabetes mellitus; COPD chronic obstructive pulmonary disease

## 2023-02-02 ENCOUNTER — Ambulatory Visit (INDEPENDENT_AMBULATORY_CARE_PROVIDER_SITE_OTHER): Payer: Medicare Other | Admitting: Ophthalmology

## 2023-02-02 ENCOUNTER — Encounter (INDEPENDENT_AMBULATORY_CARE_PROVIDER_SITE_OTHER): Payer: Self-pay | Admitting: Ophthalmology

## 2023-02-02 DIAGNOSIS — H02831 Dermatochalasis of right upper eyelid: Secondary | ICD-10-CM

## 2023-02-02 DIAGNOSIS — H4042X0 Glaucoma secondary to eye inflammation, left eye, stage unspecified: Secondary | ICD-10-CM

## 2023-02-02 DIAGNOSIS — T85398A Other mechanical complication of other ocular prosthetic devices, implants and grafts, initial encounter: Secondary | ICD-10-CM

## 2023-02-02 DIAGNOSIS — H57819 Brow ptosis, unspecified: Secondary | ICD-10-CM

## 2023-02-02 DIAGNOSIS — Z961 Presence of intraocular lens: Secondary | ICD-10-CM | POA: Diagnosis not present

## 2023-02-02 DIAGNOSIS — A6920 Lyme disease, unspecified: Secondary | ICD-10-CM

## 2023-02-02 DIAGNOSIS — H35352 Cystoid macular degeneration, left eye: Secondary | ICD-10-CM | POA: Diagnosis not present

## 2023-02-02 DIAGNOSIS — H209 Unspecified iridocyclitis: Secondary | ICD-10-CM | POA: Diagnosis not present

## 2023-02-02 DIAGNOSIS — H401321 Pigmentary glaucoma, left eye, mild stage: Secondary | ICD-10-CM | POA: Diagnosis not present

## 2023-02-02 DIAGNOSIS — H02834 Dermatochalasis of left upper eyelid: Secondary | ICD-10-CM

## 2023-02-02 DIAGNOSIS — H27122 Anterior dislocation of lens, left eye: Secondary | ICD-10-CM

## 2023-02-03 ENCOUNTER — Ambulatory Visit: Payer: Medicare Other | Admitting: Pulmonary Disease

## 2023-02-03 ENCOUNTER — Encounter: Payer: Self-pay | Admitting: Pulmonary Disease

## 2023-02-03 VITALS — BP 132/78 | HR 62 | Ht 68.0 in | Wt 175.4 lb

## 2023-02-03 DIAGNOSIS — J9611 Chronic respiratory failure with hypoxia: Secondary | ICD-10-CM

## 2023-02-03 DIAGNOSIS — J439 Emphysema, unspecified: Secondary | ICD-10-CM | POA: Diagnosis not present

## 2023-02-03 DIAGNOSIS — J4489 Other specified chronic obstructive pulmonary disease: Secondary | ICD-10-CM

## 2023-02-03 NOTE — Assessment & Plan Note (Signed)
Did not tolerate bronchodilators due to side effects and we have kept him without medications. Will repeat spirometry pre and post, feel that his lung function should be better than 60% and this may be low because he was acutely ill.

## 2023-02-03 NOTE — Addendum Note (Signed)
Addended by: Fritzi Mandes D on: 02/03/2023 09:23 AM   Modules accepted: Orders

## 2023-02-03 NOTE — Patient Instructions (Addendum)
  X spiro-pre/post  Lisinopril can cause a cough - throat clearing   OTC claritin/ zyrtec ok

## 2023-02-03 NOTE — Assessment & Plan Note (Signed)
Now off oxygen and doing well. We reviewed side effects of lisinopril, he will continue for now

## 2023-02-03 NOTE — Progress Notes (Signed)
   Subjective:    Patient ID: Christopher Randolph, male    DOB: 1946-06-28, 77 y.o.   MRN: [id number]  HPI  77  yo  heavy ex-smoker from [facility] for FU of COPD and chronic hypoxic respiratory failure.   He smoked for more than 80 pack years before he quit in 2004   [place] -CAD status post stent 2004 Hypertension, diabetes type 2 TIA 02/2022 amaurosis fugax ,right carotid stent   Initial OV 04/2022 >> adm x 2 , second admission was for acute diastolic heart failure and fluid retention,  improved with diuresis but hypoxia persisted. He was able to stay off oxygen for short periods at home, lowest desat was 88% after ambulation.  Oxygen was subsequently discontinued  Meds - Breztri/ albuterol made him jittery  Chief Complaint  Patient presents with   Follow-up    Breathing is doing well.  Has seasonal allergies    6 month FU visit Breathing is doing well.  He reports that his sinuses are stopped up on occasion. Every morning he wakes up and measures his oxygen saturation and this is always between 95 and 97%.  He has no trouble walking.  He stays active. He is not on any medications He remains on lisinopril but denies a cough, takes 20 mg of Lasix every other day   Significant tests/ events reviewed PFTs 04/2022 showed moderate airway obstruction with ratio 70, FEV1 62%, FVC 64 %, postbronchodilator FEV1 67%, DLCO 70%  CT angiogram chest 03/2022 right lower lobe atelectasis/effusion    Review of Systems neg for any significant sore throat, dysphagia, itching, sneezing, nasal congestion or excess/ purulent secretions, fever, chills, sweats, unintended wt loss, pleuritic or exertional cp, hempoptysis, orthopnea pnd or change in chronic leg swelling. Also denies presyncope, palpitations, heartburn, abdominal pain, nausea, vomiting, diarrhea or change in bowel or urinary habits, dysuria,hematuria, rash, arthralgias, visual complaints, headache, numbness weakness or ataxia.     Objective:    Physical Exam   Gen. Pleasant, elderly,well-nourished, in no distress ENT - no thrush, no pallor/icterus,no post nasal drip Neck: No JVD, no thyromegaly, no carotid bruits Lungs: no use of accessory muscles, no dullness to percussion, clear without rales or rhonchi  Cardiovascular: Rhythm regular, heart sounds  normal, no murmurs or gallops, no peripheral edema Musculoskeletal: No deformities, no cyanosis or clubbing         Assessment & Plan:

## 2023-02-08 ENCOUNTER — Other Ambulatory Visit: Payer: Self-pay | Admitting: Cardiology

## 2023-03-02 DIAGNOSIS — H57813 Brow ptosis, bilateral: Secondary | ICD-10-CM | POA: Diagnosis not present

## 2023-03-02 DIAGNOSIS — H16122 Filamentary keratitis, left eye: Secondary | ICD-10-CM | POA: Diagnosis not present

## 2023-03-02 DIAGNOSIS — H02834 Dermatochalasis of left upper eyelid: Secondary | ICD-10-CM | POA: Diagnosis not present

## 2023-03-02 DIAGNOSIS — H02831 Dermatochalasis of right upper eyelid: Secondary | ICD-10-CM | POA: Diagnosis not present

## 2023-03-02 DIAGNOSIS — Z961 Presence of intraocular lens: Secondary | ICD-10-CM | POA: Diagnosis not present

## 2023-03-02 DIAGNOSIS — H35352 Cystoid macular degeneration, left eye: Secondary | ICD-10-CM | POA: Diagnosis not present

## 2023-03-02 DIAGNOSIS — E119 Type 2 diabetes mellitus without complications: Secondary | ICD-10-CM | POA: Diagnosis not present

## 2023-03-17 ENCOUNTER — Ambulatory Visit: Payer: Medicare Other | Admitting: Vascular Surgery

## 2023-03-17 ENCOUNTER — Encounter: Payer: Self-pay | Admitting: Vascular Surgery

## 2023-03-17 VITALS — BP 101/53 | HR 59 | Temp 98.1°F | Ht 68.0 in | Wt 174.4 lb

## 2023-03-17 DIAGNOSIS — I7143 Infrarenal abdominal aortic aneurysm, without rupture: Secondary | ICD-10-CM

## 2023-03-17 DIAGNOSIS — I6521 Occlusion and stenosis of right carotid artery: Secondary | ICD-10-CM

## 2023-03-17 NOTE — Progress Notes (Signed)
[facility] [place]  Patient name: Christopher Randolph MRN: [id number] DOB: 01/27/1946 Sex: male  REASON FOR VISIT: Follow-up of carotid disease and abdominal aortic aneurysm  HPI: Christopher Randolph is a 77 y.o. male well-known to our service.  He initially was evaluated for retinal artery occlusion on the right was found to have severe carotid stenosis.  He underwent right TCAR on 03/25/2022.  He has done well since the procedure.  He specifically denies any new focal neurologic deficits.  He continues to have vision issues bilaterally.  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  Past Medical History:  Diagnosis Date   AAA (abdominal aortic aneurysm) (HCC)    Arthritis    CAD (coronary artery disease)    Branch vessel and moderate RCA disease 2006   Cancer ([place]) 1990   Melanoma Lower right Leg   Carotid artery disease (HCC)    COPD (chronic obstructive pulmonary disease) ([place])    Essential hypertension    Glaucoma    Hyperlipidemia    Hypothyroidism    Lyme disease    PAD (peripheral artery disease) (HCC)    Left common iliac stent 2004   Pneumonia    TIA (transient ischemic attack) 02/2022   Type 2 diabetes mellitus (HCC)    Wears dentures    Wears glasses     Family History  Problem Relation Age of Onset   Heart disease Mother    Heart attack Mother    Colon polyps Mother    Colon polyps Brother    Heart attack Maternal Grandmother    Heart attack Maternal Grandfather    Colon cancer Neg Hx     SOCIAL HISTORY: Social History   Tobacco Use   Smoking status: Former    Packs/day: 1.50    Years: 40.00    Additional pack years: 0.00    Total pack years: 60.00    Types: Cigarettes    Start date: 11/04/1963    Quit date: 10/29/2003    Years since quitting: 19.3    Passive exposure: Never    Smokeless tobacco: Never  Substance Use Topics   Alcohol use: Yes    Alcohol/week: 15.0 standard drinks of alcohol    Types: 14 Cans of beer, 1 Standard drinks or equivalent per week    Comment: 2 beers or a mixed drink    Allergies  Allergen Reactions   Erythromycin Swelling    swelling of lips   Shellfish Allergy Rash and Other (See Comments)    Gout flares   Azithromycin Swelling   Nitrofuran Derivatives    Nitroglycerin Other (See Comments)    Severe hypotension    Current Outpatient Medications  Medication Sig Dispense Refill   acetaminophen (TYLENOL) 325 MG tablet Take 650 mg by mouth every 6 (six) hours as needed.     amLODipine (NORVASC) 10 MG tablet Take 1 tablet by mouth daily.     aspirin EC 81 MG tablet Take 81 mg by mouth daily. Swallow whole.     atorvastatin (LIPITOR) 20 MG tablet TAKE 1 TABLET BY MOUTH DAILY (Patient taking differently: Take 20 mg by mouth daily.) 90 tablet 3   Bromfenac Sodium (PROLENSA) 0.07 % SOLN Place 1 drop into the left eye daily. 6 mL 3   clopidogrel (PLAVIX) 75 MG tablet TAKE 1 TABLET BY MOUTH  DAILY 30 tablet 11   colchicine 0.6 MG tablet Take 0.6 mg by mouth once a week.     dapagliflozin propanediol (FARXIGA) 10 MG TABS tablet Take 1 tablet (10 mg total) by mouth daily before breakfast. 30 tablet 6   fluticasone (FLONASE) 50 MCG/ACT nasal spray Place 2 sprays into both nostrils daily.     furosemide (LASIX) 20 MG tablet Take 1 tablet (20 mg total) by mouth every other day.     latanoprost (XALATAN) 0.005 % ophthalmic solution Place 1 drop into the left eye at bedtime.     levothyroxine (SYNTHROID, LEVOTHROID) 150 MCG tablet Take 150 mcg by mouth See admin instructions. Alternates taking 150 mg daily for 2 days then 175 mcg the third day then repeat  1   lisinopril (ZESTRIL) 20 MG tablet Take 20 mg by mouth daily.     mometasone (ELOCON) 0.1 % ointment Apply 1 application. topically at bedtime.     prednisoLONE acetate (PRED FORTE) 1 %  ophthalmic suspension Place 1 drop into the left eye in the morning.     tamsulosin (FLOMAX) 0.4 MG CAPS capsule Take 0.8 mg by mouth at bedtime.     ondansetron (ZOFRAN-ODT) 4 MG disintegrating tablet Take 1 tablet (4 mg total) by mouth every 8 (eight) hours as needed for nausea or vomiting. (Patient not taking: Reported on 03/17/2023) 20 tablet 0   tiZANidine (ZANAFLEX) 2 MG tablet Take 2 mg by mouth every 6 (six) hours as needed. (Patient not taking: Reported on 03/17/2023)     No current facility-administered medications for this visit.    REVIEW OF SYSTEMS:  [X]  denotes positive finding, [ ]  denotes negative finding Cardiac  Comments:  Chest pain or chest pressure:    Shortness of breath upon exertion:    Short of breath when lying flat:    Irregular heart rhythm:        Vascular    Pain in calf, thigh, or hip brought on by ambulation:    Pain in feet at night that wakes you up from your sleep:     Blood clot in your veins:    Leg swelling:           PHYSICAL EXAM: Vitals:   03/17/23 0939  BP: (!) 101/53  Pulse: (!) 59  Temp: 98.1 F (36.7 C)  SpO2: 95%  Weight: 174 lb 6.4 oz (79.1 kg)  Height: 5\' 8"  (1.727 m)    GENERAL: The patient is a well-nourished male, in no acute distress. The vital signs are documented above. CARDIOVASCULAR: Carotid arteries without bruits bilaterally.  2+ radial pulses bilaterally.  Moderate obesity and no aneurysm palpated PULMONARY: There is good air exchange  MUSCULOSKELETAL: There are no major deformities or cyanosis. NEUROLOGIC: No focal weakness or paresthesias are detected. SKIN: There are no ulcers or rashes noted. PSYCHIATRIC: The patient has a normal affect.  DATA:  Duplex from 12/17/22 at heart care was reviewed with the patient.  This reveals a widely patent stent in his right internal carotid artery.  On the left he has moderate 40 to 59% stenosis  I did review the CT scan from 12/26/2022 as well.  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  MEDICAL ISSUES: Had long discussion with the patient regarding the issue of his infrarenal aneurysm.  Explained that this is a very small and would require yearly ultrasound follow-up.  I explained that he had no limitations based on the aneurysm.  Also discussed symptoms of  leaking aneurysm and he knows to dial 911 and report immediately should this occur.  See him in December 2024 with ultrasound of his aorta.  He will continue to have carotid duplex follow-up at heart care    Rosetta Posner, MD FACS [facility] [phone]  Note: Portions of this report may have been transcribed using voice recognition software.  Every effort has been made to ensure accuracy; however, inadvertent computerized transcription errors may still be present.

## 2023-03-26 ENCOUNTER — Emergency Department (HOSPITAL_COMMUNITY)
Admission: EM | Admit: 2023-03-26 | Discharge: 2023-03-26 | Disposition: A | Discharge status: Home / Self Care | Payer: Medicare Other | Source: Home / Self Care | Attending: Emergency Medicine | Admitting: Emergency Medicine

## 2023-03-26 ENCOUNTER — Other Ambulatory Visit: Payer: Self-pay

## 2023-03-26 DIAGNOSIS — R0981 Nasal congestion: Secondary | ICD-10-CM | POA: Insufficient documentation

## 2023-03-26 DIAGNOSIS — J208 Acute bronchitis due to other specified organisms: Secondary | ICD-10-CM | POA: Diagnosis not present

## 2023-03-26 DIAGNOSIS — F1090 Alcohol use, unspecified, uncomplicated: Secondary | ICD-10-CM | POA: Diagnosis not present

## 2023-03-26 DIAGNOSIS — D649 Anemia, unspecified: Secondary | ICD-10-CM | POA: Diagnosis not present

## 2023-03-26 DIAGNOSIS — Z1152 Encounter for screening for COVID-19: Secondary | ICD-10-CM | POA: Insufficient documentation

## 2023-03-26 DIAGNOSIS — E039 Hypothyroidism, unspecified: Secondary | ICD-10-CM | POA: Diagnosis not present

## 2023-03-26 DIAGNOSIS — I714 Abdominal aortic aneurysm, without rupture, unspecified: Secondary | ICD-10-CM | POA: Diagnosis not present

## 2023-03-26 DIAGNOSIS — Z8616 Personal history of COVID-19: Secondary | ICD-10-CM | POA: Diagnosis not present

## 2023-03-26 DIAGNOSIS — Z87891 Personal history of nicotine dependence: Secondary | ICD-10-CM | POA: Diagnosis not present

## 2023-03-26 DIAGNOSIS — I5032 Chronic diastolic (congestive) heart failure: Secondary | ICD-10-CM | POA: Diagnosis not present

## 2023-03-26 DIAGNOSIS — E1165 Type 2 diabetes mellitus with hyperglycemia: Secondary | ICD-10-CM | POA: Diagnosis not present

## 2023-03-26 DIAGNOSIS — Z96651 Presence of right artificial knee joint: Secondary | ICD-10-CM | POA: Diagnosis not present

## 2023-03-26 DIAGNOSIS — M1990 Unspecified osteoarthritis, unspecified site: Secondary | ICD-10-CM | POA: Diagnosis not present

## 2023-03-26 DIAGNOSIS — I110 Hypertensive heart disease with heart failure: Secondary | ICD-10-CM | POA: Diagnosis not present

## 2023-03-26 DIAGNOSIS — J441 Chronic obstructive pulmonary disease with (acute) exacerbation: Secondary | ICD-10-CM | POA: Diagnosis not present

## 2023-03-26 DIAGNOSIS — I2510 Atherosclerotic heart disease of native coronary artery without angina pectoris: Secondary | ICD-10-CM | POA: Diagnosis not present

## 2023-03-26 DIAGNOSIS — E785 Hyperlipidemia, unspecified: Secondary | ICD-10-CM | POA: Diagnosis not present

## 2023-03-26 DIAGNOSIS — Z7982 Long term (current) use of aspirin: Secondary | ICD-10-CM | POA: Insufficient documentation

## 2023-03-26 DIAGNOSIS — R0602 Shortness of breath: Secondary | ICD-10-CM | POA: Diagnosis not present

## 2023-03-26 DIAGNOSIS — J9811 Atelectasis: Secondary | ICD-10-CM | POA: Diagnosis not present

## 2023-03-26 DIAGNOSIS — Z8673 Personal history of transient ischemic attack (TIA), and cerebral infarction without residual deficits: Secondary | ICD-10-CM | POA: Diagnosis not present

## 2023-03-26 DIAGNOSIS — E1151 Type 2 diabetes mellitus with diabetic peripheral angiopathy without gangrene: Secondary | ICD-10-CM | POA: Diagnosis not present

## 2023-03-26 DIAGNOSIS — J439 Emphysema, unspecified: Secondary | ICD-10-CM | POA: Diagnosis not present

## 2023-03-26 DIAGNOSIS — R918 Other nonspecific abnormal finding of lung field: Secondary | ICD-10-CM | POA: Diagnosis not present

## 2023-03-26 DIAGNOSIS — D696 Thrombocytopenia, unspecified: Secondary | ICD-10-CM | POA: Diagnosis not present

## 2023-03-26 DIAGNOSIS — J206 Acute bronchitis due to rhinovirus: Secondary | ICD-10-CM | POA: Diagnosis not present

## 2023-03-26 DIAGNOSIS — H409 Unspecified glaucoma: Secondary | ICD-10-CM | POA: Diagnosis not present

## 2023-03-26 DIAGNOSIS — J9601 Acute respiratory failure with hypoxia: Secondary | ICD-10-CM | POA: Diagnosis not present

## 2023-03-26 DIAGNOSIS — E861 Hypovolemia: Secondary | ICD-10-CM | POA: Diagnosis not present

## 2023-03-26 LAB — RESP PANEL BY RT-PCR (RSV, FLU A&B, COVID)  RVPGX2
Influenza A by PCR: NEGATIVE
Influenza B by PCR: NEGATIVE
Resp Syncytial Virus by PCR: NEGATIVE
SARS Coronavirus 2 by RT PCR: NEGATIVE

## 2023-03-26 NOTE — Discharge Instructions (Addendum)
Evaluation today was overall reassuring.  Your symptoms are likely consistent with viral upper respiratory infection.  I do recommend that you treat conservatively with rest and hydration at home.  Also recommend that you follow-up with your PCP in 3 to 4 days for reevaluation.  If you have new chest pain, shortness of breath, fever, extreme fatigue, inability to tolerate fluid intake or any other concern please return to the emergency department further evaluation.  Respiratory panel is still pending, please follow-up on MyChart for results.

## 2023-03-26 NOTE — ED Provider Notes (Cosign Needed Addendum)
[facility] Provider Note   CSN: [id number] Arrival date & time: 03/26/23  1041     History  Chief Complaint  Patient presents with   Nasal Congestion   HPI Christopher Randolph is a 77 y.o. male presenting for nasal congestion.  Symptoms started 4 days ago.  Patient stated he went on a trip to [facility].  When he returned home he started to have nasal congestion and a nonproductive cough.  States he feels like his "head is full of congestion".  Denies chest pain, shortness of breath, headache.  States his wife also went on the trip and has the same symptoms.  Denies calf tenderness.  Negative COVID test at home.  States he ultimately wanted to be "checked out just to make sure".  HPI     Home Medications Prior to Admission medications   Medication Sig Start Date End Date Taking? Authorizing Provider  acetaminophen (TYLENOL) 325 MG tablet Take 650 mg by mouth every 6 (six) hours as needed.    [provider]  amLODipine (NORVASC) 10 MG tablet Take 1 tablet by mouth daily.    [provider]  aspirin EC 81 MG tablet Take 81 mg by mouth daily. Swallow whole.    [provider]  atorvastatin (LIPITOR) 20 MG tablet TAKE 1 TABLET BY MOUTH DAILY Patient taking differently: Take 20 mg by mouth daily. 06/03/22   Satira Sark, MD  Bromfenac Sodium (PROLENSA) 0.07 % SOLN Place 1 drop into the left eye daily. 09/02/22   [name], MD  clopidogrel (PLAVIX) 75 MG tablet TAKE 1 TABLET BY MOUTH DAILY 02/08/23   Satira Sark, MD  colchicine 0.6 MG tablet Take 0.6 mg by mouth once a week.    [provider]  dapagliflozin propanediol (FARXIGA) 10 MG TABS tablet Take 1 tablet (10 mg total) by mouth daily before breakfast. 12/22/22   Satira Sark, MD  fluticasone [facility]) 50 MCG/ACT nasal spray Place 2 sprays into both nostrils daily.    [provider]  furosemide (LASIX) 20 MG tablet Take 1  tablet (20 mg total) by mouth every other day. 08/24/22   Satira Sark, MD  latanoprost (XALATAN) 0.005 % ophthalmic solution Place 1 drop into the left eye at bedtime. 02/11/22   [provider]  levothyroxine (SYNTHROID, LEVOTHROID) 150 MCG tablet Take 150 mcg by mouth See admin instructions. Alternates taking 150 mg daily for 2 days then 175 mcg the third day then repeat 11/01/18   [provider]  lisinopril (ZESTRIL) 20 MG tablet Take 20 mg by mouth daily. 03/09/23   [provider]  mometasone (ELOCON) 0.1 % ointment Apply 1 application. topically at bedtime.    [provider]  ondansetron (ZOFRAN-ODT) 4 MG disintegrating tablet Take 1 tablet (4 mg total) by mouth every 8 (eight) hours as needed for nausea or vomiting. Patient not taking: Reported on 03/17/2023 12/26/22   Audley Hose, MD  prednisoLONE acetate (PRED FORTE) 1 % ophthalmic suspension Place 1 drop into the left eye in the morning.    [provider]  tamsulosin (FLOMAX) 0.4 MG CAPS capsule Take 0.8 mg by mouth at bedtime.    [provider]  tiZANidine (ZANAFLEX) 2 MG tablet Take 2 mg by mouth every 6 (six) hours as needed. Patient not taking: Reported on 03/17/2023    [provider]      Allergies    Erythromycin, Shellfish  allergy, Azithromycin, Nitrofuran derivatives, and Nitroglycerin    Review of Systems   See HPI for pertinent positives   Physical Exam   Vitals:   03/26/23 1059  BP: 110/64  Pulse: 79  Resp: 19  Temp: 98.3 F (36.8 C)  SpO2: 94%    CONSTITUTIONAL:  well-appearing, NAD NEURO:  GCS 15. Speech is goal oriented. No deficits appreciated to CN III-XII; symmetric eyebrow raise, no facial drooping, tongue midline. Patient has equal grip strength bilaterally with 5/5 strength against resistance in all major muscle groups bilaterally. Sensation to light touch intact. Patient moves extremities without ataxia. Normal finger-nose-finger.  Patient ambulatory with steady gait.  EYES:  eyes equal and reactive ENT/NECK:  Supple, no stridor  CARDIO:  regular rate and rhythm, appears well-perfused  PULM:  No respiratory distress, CTAB GI/GU:  non-distended, soft MSK/SPINE:  No gross deformities, no edema, moves all extremities  SKIN:  no rash, atraumatic  *Additional and/or pertinent findings included in MDM below  ED Results / Procedures / Treatments   Labs (all labs ordered are listed, but only abnormal results are displayed) Labs Reviewed  RESP PANEL BY RT-PCR (RSV, FLU A&B, COVID)  [id number]    EKG None  Radiology No results found.  Procedures Procedures    Medications Ordered in ED Medications - No data to display  ED Course/ Medical Decision Making/ A&P                             Medical Decision Making  72-year-old male who is well-appearing presenting for nasal congestion.  Exam was unremarkable.  DDx includes URI, COVID/flu/RSV, pneumonia, and sepsis.  Overall patient looks very clinically well.  Lung sounds are clear and patient denies fever at home, sepsis and pneumonia unlikely.  Did send respiratory panel.  Patient expressed interest to be discharged and follow-up on the results on MyChart.  Given he was clinically well thought it was appropriate.  Discussed pertinent return precautions.  Advised to follow-up with his PCP in 3 to 4 days.        Final Clinical Impression(s) / ED Diagnoses Final diagnoses:  Nasal congestion    Rx / DC Orders ED Discharge Orders     None         Harriet Pho, PA-C 03/26/23 1443    Harriet Pho, PA-C 03/26/23 1446    Fredia Sorrow, MD 03/29/23 [phone]

## 2023-03-26 NOTE — ED Triage Notes (Signed)
Head congestion.  Nasal congestion. Covid test at home negative

## 2023-03-28 ENCOUNTER — Inpatient Hospital Stay (HOSPITAL_COMMUNITY)
Admission: EM | Admit: 2023-03-28 | Discharge: 2023-04-01 | DRG: 189 | Disposition: A | Discharge status: Home / Self Care | Payer: Medicare Other | Attending: Internal Medicine | Admitting: Internal Medicine

## 2023-03-28 ENCOUNTER — Emergency Department (HOSPITAL_COMMUNITY): Payer: Medicare Other

## 2023-03-28 ENCOUNTER — Other Ambulatory Visit: Payer: Self-pay

## 2023-03-28 ENCOUNTER — Encounter (HOSPITAL_COMMUNITY): Payer: Self-pay | Admitting: *Deleted

## 2023-03-28 DIAGNOSIS — E1165 Type 2 diabetes mellitus with hyperglycemia: Secondary | ICD-10-CM | POA: Diagnosis not present

## 2023-03-28 DIAGNOSIS — J9811 Atelectasis: Secondary | ICD-10-CM | POA: Diagnosis not present

## 2023-03-28 DIAGNOSIS — Z1152 Encounter for screening for COVID-19: Secondary | ICD-10-CM

## 2023-03-28 DIAGNOSIS — I2699 Other pulmonary embolism without acute cor pulmonale: Secondary | ICD-10-CM | POA: Diagnosis not present

## 2023-03-28 DIAGNOSIS — D696 Thrombocytopenia, unspecified: Secondary | ICD-10-CM | POA: Diagnosis present

## 2023-03-28 DIAGNOSIS — Z79899 Other long term (current) drug therapy: Secondary | ICD-10-CM

## 2023-03-28 DIAGNOSIS — Z79890 Hormone replacement therapy (postmenopausal): Secondary | ICD-10-CM

## 2023-03-28 DIAGNOSIS — M1990 Unspecified osteoarthritis, unspecified site: Secondary | ICD-10-CM | POA: Diagnosis present

## 2023-03-28 DIAGNOSIS — E861 Hypovolemia: Secondary | ICD-10-CM | POA: Diagnosis present

## 2023-03-28 DIAGNOSIS — J441 Chronic obstructive pulmonary disease with (acute) exacerbation: Secondary | ICD-10-CM | POA: Diagnosis not present

## 2023-03-28 DIAGNOSIS — I2510 Atherosclerotic heart disease of native coronary artery without angina pectoris: Secondary | ICD-10-CM | POA: Diagnosis present

## 2023-03-28 DIAGNOSIS — J4489 Other specified chronic obstructive pulmonary disease: Secondary | ICD-10-CM | POA: Diagnosis not present

## 2023-03-28 DIAGNOSIS — R059 Cough, unspecified: Secondary | ICD-10-CM | POA: Diagnosis not present

## 2023-03-28 DIAGNOSIS — E039 Hypothyroidism, unspecified: Secondary | ICD-10-CM | POA: Diagnosis present

## 2023-03-28 DIAGNOSIS — I110 Hypertensive heart disease with heart failure: Secondary | ICD-10-CM | POA: Diagnosis present

## 2023-03-28 DIAGNOSIS — E1151 Type 2 diabetes mellitus with diabetic peripheral angiopathy without gangrene: Secondary | ICD-10-CM | POA: Diagnosis present

## 2023-03-28 DIAGNOSIS — J439 Emphysema, unspecified: Secondary | ICD-10-CM | POA: Diagnosis present

## 2023-03-28 DIAGNOSIS — E785 Hyperlipidemia, unspecified: Secondary | ICD-10-CM | POA: Diagnosis present

## 2023-03-28 DIAGNOSIS — D649 Anemia, unspecified: Secondary | ICD-10-CM | POA: Diagnosis present

## 2023-03-28 DIAGNOSIS — I509 Heart failure, unspecified: Secondary | ICD-10-CM | POA: Diagnosis not present

## 2023-03-28 DIAGNOSIS — Z87891 Personal history of nicotine dependence: Secondary | ICD-10-CM | POA: Diagnosis not present

## 2023-03-28 DIAGNOSIS — Z85820 Personal history of malignant melanoma of skin: Secondary | ICD-10-CM

## 2023-03-28 DIAGNOSIS — J208 Acute bronchitis due to other specified organisms: Secondary | ICD-10-CM | POA: Diagnosis present

## 2023-03-28 DIAGNOSIS — J206 Acute bronchitis due to rhinovirus: Secondary | ICD-10-CM | POA: Diagnosis not present

## 2023-03-28 DIAGNOSIS — I10 Essential (primary) hypertension: Secondary | ICD-10-CM | POA: Diagnosis not present

## 2023-03-28 DIAGNOSIS — Z8673 Personal history of transient ischemic attack (TIA), and cerebral infarction without residual deficits: Secondary | ICD-10-CM | POA: Diagnosis not present

## 2023-03-28 DIAGNOSIS — F1090 Alcohol use, unspecified, uncomplicated: Secondary | ICD-10-CM | POA: Diagnosis present

## 2023-03-28 DIAGNOSIS — Z96651 Presence of right artificial knee joint: Secondary | ICD-10-CM | POA: Diagnosis present

## 2023-03-28 DIAGNOSIS — Z91013 Allergy to seafood: Secondary | ICD-10-CM

## 2023-03-28 DIAGNOSIS — I739 Peripheral vascular disease, unspecified: Secondary | ICD-10-CM | POA: Diagnosis present

## 2023-03-28 DIAGNOSIS — J449 Chronic obstructive pulmonary disease, unspecified: Secondary | ICD-10-CM | POA: Diagnosis present

## 2023-03-28 DIAGNOSIS — Z881 Allergy status to other antibiotic agents status: Secondary | ICD-10-CM

## 2023-03-28 DIAGNOSIS — Z8249 Family history of ischemic heart disease and other diseases of the circulatory system: Secondary | ICD-10-CM

## 2023-03-28 DIAGNOSIS — Z8616 Personal history of COVID-19: Secondary | ICD-10-CM | POA: Diagnosis not present

## 2023-03-28 DIAGNOSIS — I714 Abdominal aortic aneurysm, without rupture, unspecified: Secondary | ICD-10-CM | POA: Diagnosis present

## 2023-03-28 DIAGNOSIS — Z888 Allergy status to other drugs, medicaments and biological substances status: Secondary | ICD-10-CM

## 2023-03-28 DIAGNOSIS — Z7982 Long term (current) use of aspirin: Secondary | ICD-10-CM

## 2023-03-28 DIAGNOSIS — I5032 Chronic diastolic (congestive) heart failure: Secondary | ICD-10-CM | POA: Diagnosis present

## 2023-03-28 DIAGNOSIS — E119 Type 2 diabetes mellitus without complications: Secondary | ICD-10-CM

## 2023-03-28 DIAGNOSIS — R0602 Shortness of breath: Secondary | ICD-10-CM | POA: Diagnosis not present

## 2023-03-28 DIAGNOSIS — H409 Unspecified glaucoma: Secondary | ICD-10-CM | POA: Diagnosis present

## 2023-03-28 DIAGNOSIS — T380X5A Adverse effect of glucocorticoids and synthetic analogues, initial encounter: Secondary | ICD-10-CM | POA: Diagnosis not present

## 2023-03-28 DIAGNOSIS — R918 Other nonspecific abnormal finding of lung field: Secondary | ICD-10-CM | POA: Diagnosis not present

## 2023-03-28 DIAGNOSIS — J9601 Acute respiratory failure with hypoxia: Principal | ICD-10-CM | POA: Diagnosis present

## 2023-03-28 DIAGNOSIS — Z7902 Long term (current) use of antithrombotics/antiplatelets: Secondary | ICD-10-CM

## 2023-03-28 LAB — COMPREHENSIVE METABOLIC PANEL
ALT: 21 U/L (ref 0–44)
AST: 25 U/L (ref 15–41)
Albumin: 3.5 g/dL (ref 3.5–5.0)
Alkaline Phosphatase: 59 U/L (ref 38–126)
Anion gap: 16 — ABNORMAL HIGH (ref 5–15)
BUN: 23 mg/dL (ref 8–23)
CO2: 18 mmol/L — ABNORMAL LOW (ref 22–32)
Calcium: 8.4 mg/dL — ABNORMAL LOW (ref 8.9–10.3)
Chloride: 103 mmol/L (ref 98–111)
Creatinine, Ser: 1.32 mg/dL — ABNORMAL HIGH (ref 0.61–1.24)
GFR, Estimated: 57 mL/min — ABNORMAL LOW (ref >60)
Glucose, Bld: 235 mg/dL — ABNORMAL HIGH (ref 70–99)
Potassium: 3.8 mmol/L (ref 3.5–5.1)
Sodium: 137 mmol/L (ref 135–145)
Total Bilirubin: 1.3 mg/dL — ABNORMAL HIGH (ref 0.3–1.2)
Total Protein: 6.9 g/dL (ref 6.5–8.1)

## 2023-03-28 LAB — BASIC METABOLIC PANEL
Anion gap: 11 (ref 5–15)
Anion gap: 10 (ref 5–15)
BUN: 21 mg/dL (ref 8–23)
BUN: 23 mg/dL (ref 8–23)
CO2: 24 mmol/L (ref 22–32)
CO2: 20 mmol/L — ABNORMAL LOW (ref 22–32)
Calcium: 8.7 mg/dL — ABNORMAL LOW (ref 8.9–10.3)
Calcium: 7.8 mg/dL — ABNORMAL LOW (ref 8.9–10.3)
Chloride: 104 mmol/L (ref 98–111)
Chloride: 104 mmol/L (ref 98–111)
Creatinine, Ser: 1.08 mg/dL (ref 0.61–1.24)
Creatinine, Ser: 1.11 mg/dL (ref 0.61–1.24)
GFR, Estimated: >60 mL/min (ref >60)
GFR, Estimated: >60 mL/min (ref >60)
Glucose, Bld: 146 mg/dL — ABNORMAL HIGH (ref 70–99)
Glucose, Bld: 309 mg/dL — ABNORMAL HIGH (ref 70–99)
Potassium: 4 mmol/L (ref 3.5–5.1)
Potassium: 4 mmol/L (ref 3.5–5.1)
Sodium: 139 mmol/L (ref 135–145)
Sodium: 134 mmol/L — ABNORMAL LOW (ref 135–145)

## 2023-03-28 LAB — URINALYSIS, ROUTINE W REFLEX MICROSCOPIC
Bilirubin Urine: NEGATIVE
Glucose, UA: >=500 mg/dL — AB
Hgb urine dipstick: NEGATIVE
Ketones, ur: 20 mg/dL — AB
Leukocytes,Ua: NEGATIVE
Nitrite: NEGATIVE
Protein, ur: NEGATIVE mg/dL
Specific Gravity, Urine: 1.045 — ABNORMAL HIGH (ref 1.005–1.030)
pH: 5 (ref 5.0–8.0)

## 2023-03-28 LAB — LACTIC ACID, PLASMA
Lactic Acid, Venous: 1.7 mmol/L (ref 0.5–1.9)
Lactic Acid, Venous: 4.1 mmol/L (ref 0.5–1.9)
Lactic Acid, Venous: 6.2 mmol/L (ref 0.5–1.9)

## 2023-03-28 LAB — CBC WITH DIFFERENTIAL/PLATELET
Abs Immature Granulocytes: 0.03 10*3/uL (ref 0.00–0.07)
Basophils Absolute: 0.1 10*3/uL (ref 0.0–0.1)
Basophils Relative: 1 %
Eosinophils Absolute: 0.1 10*3/uL (ref 0.0–0.5)
Eosinophils Relative: 1 %
HCT: 43.2 % (ref 39.0–52.0)
Hemoglobin: 14.4 g/dL (ref 13.0–17.0)
Immature Granulocytes: 0 %
Lymphocytes Relative: 7 %
Lymphs Abs: 0.6 10*3/uL — ABNORMAL LOW (ref 0.7–4.0)
MCH: 31 pg (ref 26.0–34.0)
MCHC: 33.3 g/dL (ref 30.0–36.0)
MCV: 92.9 fL (ref 80.0–100.0)
Monocytes Absolute: 0.8 10*3/uL (ref 0.1–1.0)
Monocytes Relative: 9 %
Neutro Abs: 6.8 10*3/uL (ref 1.7–7.7)
Neutrophils Relative %: 82 %
Platelets: 125 10*3/uL — ABNORMAL LOW (ref 150–400)
RBC: 4.65 MIL/uL (ref 4.22–5.81)
RDW: 13.4 % (ref 11.5–15.5)
WBC: 8.4 10*3/uL (ref 4.0–10.5)
nRBC: 0 % (ref 0.0–0.2)

## 2023-03-28 LAB — RESP PANEL BY RT-PCR (RSV, FLU A&B, COVID)  RVPGX2
Influenza A by PCR: NEGATIVE
Influenza B by PCR: NEGATIVE
Resp Syncytial Virus by PCR: NEGATIVE
SARS Coronavirus 2 by RT PCR: NEGATIVE

## 2023-03-28 LAB — TROPONIN I (HIGH SENSITIVITY)
Troponin I (High Sensitivity): 24 ng/L — ABNORMAL HIGH (ref <18)
Troponin I (High Sensitivity): 22 ng/L — ABNORMAL HIGH (ref <18)

## 2023-03-28 LAB — APTT: aPTT: 31 seconds (ref 24–36)

## 2023-03-28 LAB — GLUCOSE, CAPILLARY
Glucose-Capillary: 138 mg/dL — ABNORMAL HIGH (ref 70–99)
Glucose-Capillary: 230 mg/dL — ABNORMAL HIGH (ref 70–99)
Glucose-Capillary: 240 mg/dL — ABNORMAL HIGH (ref 70–99)

## 2023-03-28 LAB — MRSA NEXT GEN BY PCR, NASAL: MRSA by PCR Next Gen: NOT DETECTED

## 2023-03-28 LAB — PROTIME-INR
INR: 1.3 — ABNORMAL HIGH (ref 0.8–1.2)
Prothrombin Time: 15.8 seconds — ABNORMAL HIGH (ref 11.4–15.2)

## 2023-03-28 MED ORDER — ACETAMINOPHEN 325 MG PO TABS
650.0000 mg | ORAL_TABLET | Freq: Four times a day (QID) | ORAL | Status: DC | PRN
  Administered 2023-03-28: 650 mg via ORAL
  Filled 2023-03-28: qty 2

## 2023-03-28 MED ORDER — IPRATROPIUM BROMIDE 0.02 % IN SOLN
0.5000 mg | Freq: Four times a day (QID) | RESPIRATORY_TRACT | Status: DC
  Administered 2023-03-28 – 2023-03-29 (×3): 0.5 mg via RESPIRATORY_TRACT
  Filled 2023-03-28 (×5): qty 2.5

## 2023-03-28 MED ORDER — ALBUTEROL (5 MG/ML) CONTINUOUS INHALATION SOLN
10.0000 mg/h | INHALATION_SOLUTION | RESPIRATORY_TRACT | Status: DC
  Administered 2023-03-28: 10 mg/h via RESPIRATORY_TRACT
  Filled 2023-03-28 (×3): qty 20

## 2023-03-28 MED ORDER — LEVOFLOXACIN IN D5W 500 MG/100ML IV SOLN
500.0000 mg | INTRAVENOUS | Status: DC
  Administered 2023-03-29 – 2023-03-30 (×2): 500 mg via INTRAVENOUS
  Filled 2023-03-28 (×2): qty 100

## 2023-03-28 MED ORDER — IOHEXOL 350 MG/ML SOLN
75.0000 mL | Freq: Once | INTRAVENOUS | Status: AC | PRN
  Administered 2023-03-28: 75 mL via INTRAVENOUS

## 2023-03-28 MED ORDER — ASPIRIN 81 MG PO TBEC
81.0000 mg | DELAYED_RELEASE_TABLET | Freq: Every day | ORAL | Status: DC
  Administered 2023-03-29 – 2023-04-01 (×4): 81 mg via ORAL
  Filled 2023-03-28 (×5): qty 1

## 2023-03-28 MED ORDER — KETOROLAC TROMETHAMINE 0.5 % OP SOLN
1.0000 [drp] | Freq: Four times a day (QID) | OPHTHALMIC | Status: DC
  Administered 2023-03-28 – 2023-04-01 (×14): 1 [drp] via OPHTHALMIC

## 2023-03-28 MED ORDER — METHYLPREDNISOLONE SODIUM SUCC 125 MG IJ SOLR
125.0000 mg | Freq: Once | INTRAMUSCULAR | Status: AC
  Administered 2023-03-28: 125 mg via INTRAVENOUS
  Filled 2023-03-28: qty 2

## 2023-03-28 MED ORDER — LEVOTHYROXINE SODIUM 75 MCG PO TABS
150.0000 ug | ORAL_TABLET | ORAL | Status: DC
  Administered 2023-03-29 – 2023-04-01 (×3): 150 ug via ORAL
  Filled 2023-03-28 (×3): qty 2

## 2023-03-28 MED ORDER — METHYLPREDNISOLONE SODIUM SUCC 125 MG IJ SOLR
60.0000 mg | Freq: Two times a day (BID) | INTRAMUSCULAR | Status: DC
  Administered 2023-03-28 – 2023-03-31 (×6): 60 mg via INTRAVENOUS
  Filled 2023-03-28 (×6): qty 2

## 2023-03-28 MED ORDER — HYDROCODONE-ACETAMINOPHEN 5-325 MG PO TABS: 1.0000 | ORAL_TABLET | ORAL | Status: DC | PRN

## 2023-03-28 MED ORDER — PREDNISOLONE ACETATE 1 % OP SUSP
1.0000 [drp] | Freq: Every morning | OPHTHALMIC | Status: DC
  Administered 2023-03-29 – 2023-04-01 (×4): 1 [drp] via OPHTHALMIC
  Filled 2023-03-28: qty 1

## 2023-03-28 MED ORDER — LEVALBUTEROL HCL 0.63 MG/3ML IN NEBU
0.6300 mg | INHALATION_SOLUTION | Freq: Four times a day (QID) | RESPIRATORY_TRACT | Status: DC
  Administered 2023-03-28 – 2023-03-29 (×3): 0.63 mg via RESPIRATORY_TRACT
  Filled 2023-03-28 (×5): qty 3

## 2023-03-28 MED ORDER — ALBUTEROL SULFATE (2.5 MG/3ML) 0.083% IN NEBU: 10.0000 mg/h | INHALATION_SOLUTION | Freq: Once | RESPIRATORY_TRACT | Status: DC

## 2023-03-28 MED ORDER — ACETAMINOPHEN 650 MG RE SUPP: 650.0000 mg | Freq: Four times a day (QID) | RECTAL | Status: DC | PRN

## 2023-03-28 MED ORDER — LEVOTHYROXINE SODIUM 50 MCG PO TABS: 150.0000 ug | ORAL_TABLET | ORAL | Status: DC

## 2023-03-28 MED ORDER — TAMSULOSIN HCL 0.4 MG PO CAPS
0.8000 mg | ORAL_CAPSULE | Freq: Every day | ORAL | Status: DC
  Administered 2023-03-28 – 2023-03-31 (×4): 0.8 mg via ORAL
  Filled 2023-03-28 (×4): qty 2

## 2023-03-28 MED ORDER — LEVALBUTEROL HCL 0.63 MG/3ML IN NEBU: 0.6300 mg | INHALATION_SOLUTION | RESPIRATORY_TRACT | Status: DC | PRN

## 2023-03-28 MED ORDER — CLOPIDOGREL BISULFATE 75 MG PO TABS
75.0000 mg | ORAL_TABLET | Freq: Every day | ORAL | Status: DC
  Administered 2023-03-29 – 2023-04-01 (×4): 75 mg via ORAL
  Filled 2023-03-28 (×5): qty 1

## 2023-03-28 MED ORDER — BUDESONIDE 0.25 MG/2ML IN SUSP
0.2500 mg | Freq: Two times a day (BID) | RESPIRATORY_TRACT | Status: DC
  Administered 2023-03-28 – 2023-03-31 (×6): 0.25 mg via RESPIRATORY_TRACT
  Filled 2023-03-28 (×6): qty 2

## 2023-03-28 MED ORDER — INSULIN ASPART 100 UNIT/ML IJ SOLN
0.0000 [IU] | INTRAMUSCULAR | Status: DC
  Administered 2023-03-28 (×2): 5 [IU] via SUBCUTANEOUS
  Administered 2023-03-28: 2 [IU] via SUBCUTANEOUS
  Administered 2023-03-29: 3 [IU] via SUBCUTANEOUS
  Administered 2023-03-29: 2 [IU] via SUBCUTANEOUS
  Administered 2023-03-29: 3 [IU] via SUBCUTANEOUS

## 2023-03-28 MED ORDER — THIAMINE MONONITRATE 100 MG PO TABS
100.0000 mg | ORAL_TABLET | Freq: Every day | ORAL | Status: DC
  Administered 2023-03-28 – 2023-04-01 (×5): 100 mg via ORAL
  Filled 2023-03-28 (×6): qty 1

## 2023-03-28 MED ORDER — GUAIFENESIN-DM 100-10 MG/5ML PO SYRP
5.0000 mL | ORAL_SOLUTION | ORAL | Status: DC | PRN
  Administered 2023-03-28 – 2023-03-31 (×5): 5 mL via ORAL
  Filled 2023-03-28 (×5): qty 5

## 2023-03-28 MED ORDER — ONDANSETRON HCL 4 MG PO TABS: 4.0000 mg | ORAL_TABLET | Freq: Four times a day (QID) | ORAL | Status: DC | PRN

## 2023-03-28 MED ORDER — LATANOPROST 0.005 % OP SOLN
1.0000 [drp] | Freq: Every day | OPHTHALMIC | Status: DC
  Administered 2023-03-28 – 2023-03-31 (×4): 1 [drp] via OPHTHALMIC

## 2023-03-28 MED ORDER — LORAZEPAM 1 MG PO TABS: 1.0000 mg | ORAL_TABLET | ORAL | Status: AC | PRN

## 2023-03-28 MED ORDER — SODIUM CHLORIDE 0.9 % IV SOLN: INTRAVENOUS | Status: DC

## 2023-03-28 MED ORDER — ONDANSETRON HCL 4 MG/2ML IJ SOLN: 4.0000 mg | Freq: Four times a day (QID) | INTRAMUSCULAR | Status: DC | PRN

## 2023-03-28 MED ORDER — ADULT MULTIVITAMIN W/MINERALS CH
1.0000 | ORAL_TABLET | Freq: Every day | ORAL | Status: DC
  Administered 2023-03-28 – 2023-04-01 (×5): 1 via ORAL
  Filled 2023-03-28 (×6): qty 1

## 2023-03-28 MED ORDER — FOLIC ACID 1 MG PO TABS
1.0000 mg | ORAL_TABLET | Freq: Every day | ORAL | Status: DC
  Administered 2023-03-28 – 2023-04-01 (×5): 1 mg via ORAL
  Filled 2023-03-28 (×6): qty 1

## 2023-03-28 MED ORDER — LEVOTHYROXINE SODIUM 75 MCG PO TABS
175.0000 ug | ORAL_TABLET | ORAL | Status: DC
  Administered 2023-03-31: 175 ug via ORAL
  Filled 2023-03-28: qty 1

## 2023-03-28 MED ORDER — IPRATROPIUM BROMIDE 0.02 % IN SOLN
0.5000 mg | Freq: Once | RESPIRATORY_TRACT | Status: AC
  Administered 2023-03-28: 0.5 mg via RESPIRATORY_TRACT
  Filled 2023-03-28: qty 2.5

## 2023-03-28 MED ORDER — CHLORHEXIDINE GLUCONATE CLOTH 2 % EX PADS
6.0000 | MEDICATED_PAD | Freq: Every day | CUTANEOUS | Status: DC
  Administered 2023-03-29 – 2023-03-30 (×2): 6 via TOPICAL

## 2023-03-28 MED ORDER — SODIUM CHLORIDE 0.9 % IV BOLUS
1000.0000 mL | Freq: Once | INTRAVENOUS | Status: AC
  Administered 2023-03-28: 1000 mL via INTRAVENOUS

## 2023-03-28 MED ORDER — SODIUM CHLORIDE 0.9 % IV BOLUS
500.0000 mL | Freq: Once | INTRAVENOUS | Status: AC
  Administered 2023-03-28: 500 mL via INTRAVENOUS

## 2023-03-28 MED ORDER — LEVOFLOXACIN IN D5W 750 MG/150ML IV SOLN
750.0000 mg | Freq: Once | INTRAVENOUS | Status: DC
  Administered 2023-03-28: 750 mg via INTRAVENOUS
  Filled 2023-03-28: qty 150

## 2023-03-28 MED ORDER — THIAMINE HCL 100 MG/ML IJ SOLN
100.0000 mg | Freq: Every day | INTRAMUSCULAR | Status: DC
  Filled 2023-03-28: qty 2

## 2023-03-28 MED ORDER — ENOXAPARIN SODIUM 40 MG/0.4ML IJ SOSY
40.0000 mg | PREFILLED_SYRINGE | INTRAMUSCULAR | Status: DC
  Administered 2023-03-28 – 2023-03-31 (×4): 40 mg via SUBCUTANEOUS
  Filled 2023-03-28 (×4): qty 0.4

## 2023-03-28 MED ORDER — MAGNESIUM SULFATE 2 GM/50ML IV SOLN
2.0000 g | Freq: Once | INTRAVENOUS | Status: AC
  Administered 2023-03-28: 2 g via INTRAVENOUS
  Filled 2023-03-28: qty 50

## 2023-03-28 NOTE — ED Notes (Signed)
Christopher Copa, MD made aware of Lactic Acid result of 4.1

## 2023-03-28 NOTE — ED Provider Notes (Signed)
[facility] Provider Note   CSN: [id number] Arrival date & time: 03/28/23  [id number]     History  Chief Complaint  Patient presents with   Shortness of Breath   HPI Christopher Randolph is a 77 y.o. male with CAD, COPD and type 2 diabetes presenting for shortness of breath.  States about a week ago he went on a trip to [facility] with his wife.  They both came back with cough and congestion.  Was seen here 2 days ago for nasal congestion but was discharged home because he was clinically well at that time.  States symptoms have progressed rather quickly at home.  Patient is more short of breath.  Worse with exertion.  Denies chest pain.  Also states he is having trouble breathing.  Also mention that his cough and congestion seems to be worse.  Patient states he is not a smoker.   Shortness of Breath      Home Medications Prior to Admission medications   Medication Sig Start Date End Date Taking? Authorizing Provider  acetaminophen (TYLENOL) 500 MG tablet Take 1,000 mg by mouth daily as needed for moderate pain, fever or headache.   Yes [provider]  aspirin EC 81 MG tablet Take 81 mg by mouth daily.   Yes [provider]  atorvastatin (LIPITOR) 20 MG tablet TAKE 1 TABLET BY MOUTH DAILY Patient taking differently: Take 20 mg by mouth daily. 06/03/22  Yes Satira Sark, MD  Bromfenac Sodium (PROLENSA) 0.07 % SOLN Place 1 drop into the left eye daily. 09/02/22  Yes [name], MD  clopidogrel (PLAVIX) 75 MG tablet TAKE 1 TABLET BY MOUTH DAILY 02/08/23  Yes Satira Sark, MD  dapagliflozin propanediol (FARXIGA) 10 MG TABS tablet Take 1 tablet (10 mg total) by mouth daily before breakfast. 12/22/22  Yes Satira Sark, MD  furosemide (LASIX) 20 MG tablet Take 1 tablet (20 mg total) by mouth every other day. 08/24/22  Yes Satira Sark, MD  latanoprost (XALATAN) 0.005 % ophthalmic solution Place 1 drop into the  left eye at bedtime. 02/11/22  Yes [provider]  levothyroxine (SYNTHROID) 175 MCG tablet Take 175 mcg by mouth See admin instructions. 175 mcg every 3rd day.   Yes [provider]  levothyroxine (SYNTHROID, LEVOTHROID) 150 MCG tablet Take 150 mcg by mouth See admin instructions. 150 mcg once daily for 2 days, 123mcg every 3rd day - repeat. 11/01/18  Yes [provider]  lisinopril (ZESTRIL) 20 MG tablet Take 20 mg by mouth daily. 03/09/23  Yes [provider]  prednisoLONE acetate (PRED FORTE) 1 % ophthalmic suspension Place 1 drop into the left eye daily.   Yes [provider]  tamsulosin (FLOMAX) 0.4 MG CAPS capsule Take 0.8 mg by mouth at bedtime.   Yes [provider]  ondansetron (ZOFRAN-ODT) 4 MG disintegrating tablet Take 1 tablet (4 mg total) by mouth every 8 (eight) hours as needed for nausea or vomiting. Patient not taking: Reported on 03/17/2023 12/26/22   Audley Hose, MD      Allergies    Erythromycin, Shellfish allergy, Nitrofuran derivatives, Nitrostat [nitroglycerin], and Zithromax [azithromycin]    Review of Systems   Review of Systems  Respiratory:  Positive for shortness of breath.     Physical Exam   Vitals:   03/28/23 1607 03/28/23 1657  BP:    Pulse:    Resp:    Temp: 97.9 F (  36.6 C)   SpO2:  94%    CONSTITUTIONAL:  ill-appearing, on non-rebreather, in respiratory distress NEURO:  Alert and oriented x 3, CN 3-12 grossly intact EYES:  eyes equal and reactive ENT/NECK:  Supple, no stridor  CARDIO:  tachycardia rate and rhythm, appears well-perfused  PULM:  Distressed, on nonrebreather, tachypneic, coarse, wheezing with diminished breath sounds in the middle and lower lung fields bilaterally GI/GU:  non-distended, soft MSK/SPINE:  No gross deformities, no edema, moves all extremities  SKIN:  no rash, atraumatic  *Additional and/or pertinent findings included in MDM below   ED Results / Procedures /  Treatments   Labs (all labs ordered are listed, but only abnormal results are displayed) Labs Reviewed  BASIC METABOLIC PANEL - Abnormal; Notable for the following components:      Result Value   Glucose, Bld 146 (*)    Calcium 8.7 (*)    All other components within normal limits  CBC WITH DIFFERENTIAL/PLATELET - Abnormal; Notable for the following components:   Platelets 125 (*)    Lymphs Abs 0.6 (*)    All other components within normal limits  LACTIC ACID, PLASMA - Abnormal; Notable for the following components:   Lactic Acid, Venous 4.1 (*)    All other components within normal limits  COMPREHENSIVE METABOLIC PANEL - Abnormal; Notable for the following components:   CO2 18 (*)    Glucose, Bld 235 (*)    Creatinine, Ser 1.32 (*)    Calcium 8.4 (*)    Total Bilirubin 1.3 (*)    GFR, Estimated 57 (*)    Anion gap 16 (*)    All other components within normal limits  PROTIME-INR - Abnormal; Notable for the following components:   Prothrombin Time 15.8 (*)    INR 1.3 (*)    All other components within normal limits  URINALYSIS, ROUTINE W REFLEX MICROSCOPIC - Abnormal; Notable for the following components:   Specific Gravity, Urine 1.045 (*)    Glucose, UA >=500 (*)    Ketones, ur 20 (*)    Bacteria, UA RARE (*)    All other components within normal limits  LACTIC ACID, PLASMA - Abnormal; Notable for the following components:   Lactic Acid, Venous 6.2 (*)    All other components within normal limits  GLUCOSE, CAPILLARY - Abnormal; Notable for the following components:   Glucose-Capillary 230 (*)    All other components within normal limits  BASIC METABOLIC PANEL - Abnormal; Notable for the following components:   Sodium 134 (*)    CO2 20 (*)    Glucose, Bld 309 (*)    Calcium 7.8 (*)    All other components within normal limits  TROPONIN I (HIGH SENSITIVITY) - Abnormal; Notable for the following components:   Troponin I (High Sensitivity) 24 (*)    All other components  within normal limits  TROPONIN I (HIGH SENSITIVITY) - Abnormal; Notable for the following components:   Troponin I (High Sensitivity) 22 (*)    All other components within normal limits  RESP PANEL BY RT-PCR (RSV, FLU A&B, COVID)  [id number]  CULTURE, BLOOD (ROUTINE X 2)  CULTURE, BLOOD (ROUTINE X 2)  MRSA NEXT GEN BY PCR, NASAL  RESPIRATORY PANEL BY PCR  LACTIC ACID, PLASMA  APTT  HEMOGLOBIN A1C  LACTIC ACID, PLASMA  COMPREHENSIVE METABOLIC PANEL  CBC    EKG EKG Interpretation  Date/Time:  Sunday March 28 2023 13:43:51 EDT Ventricular Rate:  119 PR Interval:  138 QRS  Duration: 90 QT Interval:  286 QTC Calculation: 403 R Axis:   61 Text Interpretation: Sinus tachycardia Borderline repolarization abnormality No significant change since last tracing Confirmed by Aletta Edouard [phone]) on 03/28/2023 1:51:42 PM  Radiology CT Angio Chest Pulmonary Embolism (PE) W or WO Contrast  Result Date: 03/28/2023 CLINICAL DATA:  PE suspected, worsening shortness of breath EXAM: CT ANGIOGRAPHY CHEST WITH CONTRAST TECHNIQUE: Multidetector CT imaging of the chest was performed using the standard protocol during bolus administration of intravenous contrast. Multiplanar CT image reconstructions and MIPs were obtained to evaluate the vascular anatomy. RADIATION DOSE REDUCTION: This exam was performed according to the departmental dose-optimization program which includes automated exposure control, adjustment of the mA and/or kV according to patient size and/or use of iterative reconstruction technique. CONTRAST:  24mL OMNIPAQUE IOHEXOL 350 MG/ML SOLN COMPARISON:  03/30/2022 FINDINGS: Cardiovascular: Examination for pulmonary embolism is somewhat limited by breath motion artifact, particularly in the lung bases. Within this limitation, negative examination for pulmonary embolism through the proximal segmental pulmonary arterial level. Normal heart size. Three-vessel coronary artery calcifications. No  pericardial effusion. Aortic atherosclerosis. Mediastinum/Nodes: No enlarged mediastinal, hilar, or axillary lymph nodes. Thyroid gland, trachea, and esophagus demonstrate no significant findings. Lungs/Pleura: Heterogeneous consolidative airspace opacity throughout the bilateral lung bases, worst in the dependent left lung base (series 6, image 117). Probable underlying scarring and volume loss of the right lung base (series 6, image 84). No pleural effusion or pneumothorax. Upper Abdomen: No acute abnormality. Musculoskeletal: No chest wall abnormality. No acute osseous findings. Review of the MIP images confirms the above findings. IMPRESSION: 1. Examination for pulmonary embolism is somewhat limited by breath motion artifact, particularly in the lung bases. Within this limitation, negative examination for pulmonary embolism through the proximal segmental pulmonary arterial level. 2. Heterogeneous consolidative airspace opacity throughout the bilateral lung bases, worst in the dependent left lung base. Findings are consistent with multifocal infection or aspiration. 3. Probable underlying scarring and volume loss of the right lung base. 4. Coronary artery disease. Aortic Atherosclerosis (ICD10-I70.0). Electronically Signed   By: Delanna Ahmadi M.D.   On: 03/28/2023 14:37   DG Chest Port 1 View  Result Date: 03/28/2023 CLINICAL DATA:  77 year old male with history of shortness of breath. EXAM: PORTABLE CHEST 1 VIEW COMPARISON:  Chest x-ray 05/27/2022. FINDINGS: Lung volumes are low. Widespread interstitial prominence and peribronchial cuffing. No consolidative airspace disease. No pleural effusions. No pneumothorax. No pulmonary nodule or mass noted. Pulmonary vasculature and the cardiomediastinal silhouette are within normal limits. Atherosclerotic calcifications in the thoracic aorta. IMPRESSION: 1. Diffuse interstitial prominence and peribronchial cuffing, which may suggest an acute bronchitis. 2. Aortic  atherosclerosis. Electronically Signed   By: Vinnie Langton M.D.   On: 03/28/2023 10:52    Procedures .Critical Care  Performed by: Harriet Pho, PA-C Authorized by: Harriet Pho, PA-C   Critical care provider statement:    Critical care time (minutes):  30   Critical care was necessary to treat or prevent imminent or life-threatening deterioration of the following conditions:  Sepsis and respiratory failure   Critical care was time spent personally by me on the following activities:  Development of treatment plan with patient or surrogate, discussions with consultants, evaluation of patient's response to treatment, examination of patient, ordering and review of laboratory studies, ordering and review of radiographic studies, ordering and performing treatments and interventions, pulse oximetry, re-evaluation of patient's condition and review of old charts     Medications Ordered in ED Medications  levofloxacin (LEVAQUIN) IVPB 500 mg (has no administration in time range)  methylPREDNISolone sodium succinate (SOLU-MEDROL) 125 mg/2 mL injection 60 mg (has no administration in time range)  aspirin EC tablet 81 mg (has no administration in time range)  clopidogrel (PLAVIX) tablet 75 mg (has no administration in time range)  tamsulosin (FLOMAX) capsule 0.8 mg (has no administration in time range)  ketorolac (ACULAR) 0.5 % ophthalmic solution 1 drop (has no administration in time range)  latanoprost (XALATAN) 0.005 % ophthalmic solution 1 drop (has no administration in time range)  prednisoLONE acetate (PRED FORTE) 1 % ophthalmic suspension 1 drop (has no administration in time range)  levalbuterol (XOPENEX) nebulizer solution 0.63 mg (0.63 mg Nebulization Given 03/28/23 1657)  ipratropium (ATROVENT) nebulizer solution 0.5 mg (0.5 mg Nebulization Given 03/28/23 1657)  budesonide (PULMICORT) nebulizer solution 0.25 mg (has no administration in time range)  levalbuterol (XOPENEX) nebulizer  solution 0.63 mg (has no administration in time range)  insulin aspart (novoLOG) injection 0-15 Units (5 Units Subcutaneous Given 03/28/23 1625)  enoxaparin (LOVENOX) injection 40 mg (has no administration in time range)  0.9 %  sodium chloride infusion ( Intravenous New Bag/Given 03/28/23 1825)  acetaminophen (TYLENOL) tablet 650 mg (has no administration in time range)    Or  acetaminophen (TYLENOL) suppository 650 mg (has no administration in time range)  HYDROcodone-acetaminophen (NORCO/VICODIN) 5-325 MG per tablet 1-2 tablet (has no administration in time range)  ondansetron (ZOFRAN) tablet 4 mg (has no administration in time range)    Or  ondansetron (ZOFRAN) injection 4 mg (has no administration in time range)  LORazepam (ATIVAN) tablet 1-4 mg (has no administration in time range)  thiamine (VITAMIN B1) tablet 100 mg (100 mg Oral Given 03/28/23 1622)    Or  thiamine (VITAMIN B1) injection 100 mg ( Intravenous See Alternative [id number])  folic acid (FOLVITE) tablet 1 mg (1 mg Oral Given 03/28/23 1622)  multivitamin with minerals tablet 1 tablet (1 tablet Oral Given 03/28/23 1622)  levothyroxine (SYNTHROID) tablet 175 mcg (has no administration in time range)  levothyroxine (SYNTHROID) tablet 150 mcg (has no administration in time range)  ipratropium (ATROVENT) nebulizer solution 0.5 mg (0.5 mg Nebulization Given 03/28/23 1105)  magnesium sulfate IVPB 2 g 50 mL (0 g Intravenous Stopped 03/28/23 1145)  methylPREDNISolone sodium succinate (SOLU-MEDROL) 125 mg/2 mL injection 125 mg (125 mg Intravenous Given 03/28/23 1047)  sodium chloride 0.9 % bolus 500 mL (500 mLs Intravenous Bolus from Bag 03/28/23 1434)  iohexol (OMNIPAQUE) 350 MG/ML injection 75 mL (75 mLs Intravenous Contrast Given 03/28/23 1422)  sodium chloride 0.9 % bolus 1,000 mL (1,000 mLs Intravenous New Bag/Given 03/28/23 1609)    ED Course/ Medical Decision Making/ A&P Clinical Course as of 03/28/23 1927  Sun Mar 28, 2023  1032  Tachypneic and hypoxic in triage.  Initially placed on 7 L of nasal cannula satting 88%.  Then placed on nonrebreather and sats improved to 99%. [JR]  [id number] 77 year old male here with increased cough and congestion shortness of breath.  Sats were low at home.  Wife sick with rhinovirus in the hospital.  Hypoxic here on presentation and currently on nonrebreather.  Getting lab work chest x-ray cultures antibiotics will need admission to the hospital. [MB]    Clinical Course User Index [JR] Harriet Pho, PA-C [MB] Hayden Rasmussen, MD  Medical Decision Making Amount and/or Complexity of Data Reviewed Labs: ordered. Radiology: ordered. ECG/medicine tests: ordered.  Risk Prescription drug management. Decision regarding hospitalization.   Initial Impression and Ddx 77 year old male who is ill-appearing, in respiratory distress presenting for shortness of breath.  Exam notable for tachypnea, on nonrebreather, diminished breath sounds, and wheezing. Patient PMH that increases complexity of ED encounter:  CAD, COPD and type 2 diabetes presenting for shortness of breath  Interpretation of Diagnostics - I independent reviewed and interpreted the labs as followed: Elevated lactic acid  - I independently visualized the following imaging with scope of interpretation limited to determining acute life threatening conditions related to emergency care: Chest x-ray, which revealed findings suggestive of acute bronchitis  -I personally reviewed and interpreted EKG which revealed sinus tachycardia  -Last echo in June 2023 revealed EF of 60 to 65%  Patient Reassessment and Ultimate Disposition/Management Patient in obvious respiratory distress on initial presentation. Improved overall after breathing treatment. Still requiring 5 L by nasal cannula to maintain O2 sats.  Initially without lactic acidosis but on second check was 4.1. At this point started on it antibiotics  and volume resuscitated with half-normal saline bolus. Admitted to hospital service for sepsis evaluation likely secondary to respiratory cause and new oxygen requirement.  Patient management required discussion with the following services or consulting groups:  Hospitalist Service  Complexity of Problems Addressed Acute complicated illness or Injury  Additional Data Reviewed and Analyzed Further history obtained from: Past medical history and medications listed in the EMR, Prior ED visit notes, and Recent discharge summary  Patient Encounter Risk Assessment Consideration of hospitalization         Final Clinical Impression(s) / ED Diagnoses Final diagnoses:  SOB (shortness of breath)    Rx / DC Orders ED Discharge Orders     None         Harriet Pho, PA-C 03/28/23 1927    Hayden Rasmussen, MD 03/29/23 1821

## 2023-03-28 NOTE — H&P (Addendum)
[facility] History and Physical  Christopher Randolph [id number] DOB: [birth year] DOA: 03/28/2023   PCP: Curlene Labrum, MD  Specialists: Dr. Domenic Polite with cardiology  Chief Complaint: Shortness of breath  HPI: Christopher Randolph is a 77 y.o. male with a past medical history of chronic diastolic CHF, carotid artery disease, diabetes mellitus type 2, essential hypertension, hypothyroidism who was in his usual state of health till last week when he went on a trip with his wife.  After they returned home his wife felt sick with cough, congestion shortness of breath.  She was evaluated in the emergency department and due to hypoxia she was hospitalized.  She was eventually found to have rhinovirus.  Patient was also having symptoms then however his oxygenation and lung exam was unremarkable so he was not thought to require hospitalization.  However over the past 48 hours patient's shortness of breath is worsened.  He is coughing with grayish expectoration.  No blood in the sputum.  No chest pain.  No nausea or vomiting.  Denies any fever per se.  Denies any leg swelling.  No orthopnea or PND.  His symptoms continue to get worse.  He had oxygen measuring device at home which showed that his oxygen saturations were in the 70s.  He spoke to his wife and daughter who advised him to come to the emergency department.    In the emergency department he was initially saturating in the 70s requiring and actually nasal cannula followed by nonrebreather and now back on nasal cannula.  He is currently on 5 L saturating in the early 90s.  He received nebulizer treatment after which he is feeling better.  Home Medications: This list is not reconciled yet Prior to Admission medications   Medication Sig Start Date End Date Taking? Authorizing Provider  acetaminophen (TYLENOL) 325 MG tablet Take 650 mg by mouth every 6 (six) hours as needed.    [provider]  amLODipine (NORVASC) 10 MG tablet Take 1  tablet by mouth daily.    [provider]  aspirin EC 81 MG tablet Take 81 mg by mouth daily. Swallow whole.    [provider]  atorvastatin (LIPITOR) 20 MG tablet TAKE 1 TABLET BY MOUTH DAILY Patient taking differently: Take 20 mg by mouth daily. 06/03/22   Satira Sark, MD  Bromfenac Sodium (PROLENSA) 0.07 % SOLN Place 1 drop into the left eye daily. 09/02/22   [name], MD  clopidogrel (PLAVIX) 75 MG tablet TAKE 1 TABLET BY MOUTH DAILY 02/08/23   Satira Sark, MD  dapagliflozin propanediol (FARXIGA) 10 MG TABS tablet Take 1 tablet (10 mg total) by mouth daily before breakfast. 12/22/22   Satira Sark, MD  fluticasone [facility]) 50 MCG/ACT nasal spray Place 2 sprays into both nostrils daily.    [provider]  furosemide (LASIX) 20 MG tablet Take 1 tablet (20 mg total) by mouth every other day. 08/24/22   Satira Sark, MD  latanoprost (XALATAN) 0.005 % ophthalmic solution Place 1 drop into the left eye at bedtime. 02/11/22   [provider]  levothyroxine (SYNTHROID, LEVOTHROID) 150 MCG tablet Take 150 mcg by mouth See admin instructions. Alternates taking 150 mg daily for 2 days then 175 mcg the third day then repeat 11/01/18   [provider]  lisinopril (ZESTRIL) 20 MG tablet Take 20 mg by mouth daily. 03/09/23   [provider]  mometasone (ELOCON) 0.1 % ointment Apply 1 application. topically at bedtime.  [provider]  ondansetron (ZOFRAN-ODT) 4 MG disintegrating tablet Take 1 tablet (4 mg total) by mouth every 8 (eight) hours as needed for nausea or vomiting. Patient not taking: Reported on 03/17/2023 12/26/22   Audley Hose, MD  prednisoLONE acetate (PRED FORTE) 1 % ophthalmic suspension Place 1 drop into the left eye in the morning.    [provider]  tamsulosin (FLOMAX) 0.4 MG CAPS capsule Take 0.8 mg by mouth at bedtime.    [provider]  tiZANidine (ZANAFLEX) 2 MG tablet Take  2 mg by mouth every 6 (six) hours as needed. Patient not taking: Reported on 03/17/2023    [provider]    Allergies:  Allergies  Allergen Reactions   Erythromycin Swelling    Lip swelling   Shellfish Allergy Rash and Other (See Comments)    Gout flares   Azithromycin Swelling   Nitrofuran Derivatives Swelling   Nitroglycerin Other (See Comments)    Severe hypotension    Past Medical History: Past Medical History:  Diagnosis Date   AAA (abdominal aortic aneurysm) (HCC)    Arthritis    CAD (coronary artery disease)    Branch vessel and moderate RCA disease 2006   Cancer [facility]) 1990   Melanoma Lower right Leg   Carotid artery disease (HCC)    COPD (chronic obstructive pulmonary disease) ([place])    Essential hypertension    Glaucoma    Hyperlipidemia    Hypothyroidism    Lyme disease    PAD (peripheral artery disease) ([place])    Left common iliac stent 2004   Pneumonia    TIA (transient ischemic attack) 02/2022   Type 2 diabetes mellitus ([place])    Wears dentures    Wears glasses     Past Surgical History:  Procedure Laterality Date   CATARACT EXTRACTION Bilateral    CATARACT EXTRACTION, BILATERAL     CERVICAL DISC SURGERY     x 1   COLONOSCOPY N/A 11/06/2016   Procedure: COLONOSCOPY;  Surgeon: Daneil Dolin, MD;  [facility];  Service: Endoscopy;  Laterality: N/A;  7:30 AM   COLONOSCOPY  2012   Dr. Anthony Sar: normal. reviewed reports, which states he has a history of polyps in remote past.    ELBOW SURGERY Left    EYE SURGERY Bilateral    Cat Sx   EYE SURGERY Left 01/14/2021   [place] - Dr. Jovita Kussmaul   IRIDOTOMY / IRIDECTOMY Left 01/14/2021   [place] - Dr. Jovita Kussmaul   JOINT REPLACEMENT     LUMBAR WOUND DEBRIDEMENT N/A 01/24/2019   Procedure: LUMBAR WOUND Exploration for Evacation of Seroma vs. Hematoma;  Surgeon: Jovita Gamma, MD;  Location: [place];  Service: Neurosurgery;  Laterality: N/A;   MELANOMA EXCISION     right leg, [facility] at [facility]  and underwent immunotherapy   NECK SURGERY      x 1 yrs ago   PARS PLANA VITRECTOMY Left 04/04/2020   Procedure: PARS PLANA VITRECTOMY WITH 25G REMOVAL/SUTURE INTRAOCULAR LENS;  Surgeon: [name], MD;  Location: [place];  Service: Ophthalmology;  Laterality: Left;   REPLACEMENT TOTAL KNEE Right    TRANSCAROTID ARTERY REVASCULARIZATION  Right 03/25/2022   Procedure: Right Transcarotid Artery Revascularization;  Surgeon: Serafina Mitchell, MD;  Location: [facility];  Service: Vascular;  Laterality: Right;   WISDOM TOOTH EXTRACTION      Social History: Lives with his wife.  Quit smoking 20 years ago.  Drinks 1-2 beers on a  daily basis.  No illicit drug use.  Family History:  Family History  Problem Relation Age of Onset   Heart disease Mother    Heart attack Mother    Colon polyps Mother    Colon polyps Brother    Heart attack Maternal Grandmother    Heart attack Maternal Grandfather    Colon cancer Neg Hx      Review of Systems - History obtained from the patient General ROS: positive for  - fatigue and malaise Psychological ROS: negative Ophthalmic ROS: negative ENT ROS: negative Allergy and Immunology ROS: negative Hematological and Lymphatic ROS: negative Endocrine ROS: negative Respiratory ROS: As in HPI Cardiovascular ROS: no chest pain or dyspnea on exertion Gastrointestinal ROS: no abdominal pain, change in bowel habits, or black or bloody stools Genito-Urinary ROS: no dysuria, trouble voiding, or hematuria Musculoskeletal ROS: negative Neurological ROS: no TIA or stroke symptoms Dermatological ROS: negative  Physical Examination  Vitals:   03/28/23 1115 03/28/23 1130 03/28/23 1230 03/28/23 1300  BP:  (!) 147/54 (!) 133/41 (!) 124/47  Pulse: (!) 111 (!) 114  (!) 118  Resp: (!) 26 19 (!) 30 (!) 25  Temp:      TempSrc:      SpO2: 95% 95%  (!) 88%  Weight:      Height:        BP (!) 124/47   Pulse (!) 118   Temp 98.8 F (37.1 C) (Oral)   Resp (!) 25   Ht 5'  8" (1.727 m)   Wt 75 kg   SpO2 (!) 88%   BMI 25.14 kg/m   General appearance: alert, cooperative, appears stated age, and no distress Head: Normocephalic, without obvious abnormality, atraumatic Eyes: conjunctivae/corneas clear. PERRL, EOM's intact. Throat: lips, mucosa, and tongue normal; teeth and gums normal Neck: no adenopathy, no carotid bruit, no JVD, supple, symmetrical, trachea midline, and thyroid not enlarged, symmetric, no tenderness/mass/nodules Back: symmetric, no curvature. ROM normal. No CVA tenderness. Resp: Tachypneic.  No use of accessory muscles.  Coarse breath sounds bilaterally with few crackles at the bases more on the right.  Scattered wheezing.  No rhonchi. Cardio: S1-S2 is tachycardic regular.  No S3-S4.  No rubs murmurs or bruit. GI: soft, non-tender; bowel sounds normal; no masses,  no organomegaly Extremities: extremities normal, atraumatic, no cyanosis or edema Pulses: 2+ and symmetric Skin: Skin color, texture, turgor normal. No rashes or lesions Lymph nodes: Cervical, supraclavicular, and axillary nodes normal. Neurologic: Alert and oriented x 3.  Cranial nerves II to XII intact.  Motor strength equal bilateral upper and lower extremities.   Labs on Admission:   CBC: Recent Labs  Lab 03/28/23 1032  WBC 8.4  NEUTROABS 6.8  HGB 14.4  HCT 43.2  MCV 92.9  PLT 0000000*   Basic Metabolic Panel: Recent Labs  Lab 03/28/23 1032  NA 139  K 4.0  CL 104  CO2 24  GLUCOSE 146*  BUN 21  CREATININE 1.08  CALCIUM 8.7*   GFR: Estimated Creatinine Clearance: 47.5 mL/min (A) (by C-G formula based on SCr of 1.32 mg/dL (H)).    Radiological Exams on Admission: DG Chest Port 1 View  Result Date: 03/28/2023 CLINICAL DATA:  77 year old male with history of shortness of breath. EXAM: PORTABLE CHEST 1 VIEW COMPARISON:  Chest x-ray 05/27/2022. FINDINGS: Lung volumes are low. Widespread interstitial prominence and peribronchial cuffing. No consolidative airspace  disease. No pleural effusions. No pneumothorax. No pulmonary nodule or mass noted. Pulmonary vasculature and the cardiomediastinal  silhouette are within normal limits. Atherosclerotic calcifications in the thoracic aorta. IMPRESSION: 1. Diffuse interstitial prominence and peribronchial cuffing, which may suggest an acute bronchitis. 2. Aortic atherosclerosis. Electronically Signed   By: Vinnie Langton M.D.   On: 03/28/2023 10:52    My interpretation of Electrocardiogram: Sinus tachycardia at 119 bpm.  Normal axis.  Intervals appear to be normal.  No concerning ST or T wave changes noted.   Problem List  Principal Problem:   Acute respiratory failure with hypoxia (HCC) Active Problems:   PAD (peripheral artery disease) (HCC)   CAD (coronary artery disease)   Hypertension   Hypothyroidism   DMII (diabetes mellitus, type 2) (HCC)   COPD with chronic bronchitis and emphysema (HCC)   Assessment: This is a 77 year old Caucasian male with past medical history as stated earlier who comes in with 2 to 3-day history of worsening shortness of breath.  He was noted to be quite hypoxic in triage in the emergency department.  He will need hospitalization for further management.  Differential diagnosis is broad including viral bronchitis considering that his wife is also in the hospital with rhinovirus bronchitis.  Venous thromboembolism is another differential.  No pulmonary edema noted on chest x-ray or examination so CHF is less likely.  COPD exacerbation is a possibility.  Plan: #1. Acute respiratory failure with hypoxia: Proceed with CT angiogram to rule out pulmonary embolism.  Agree with giving antibiotics.  Will continue with levofloxacin.  Given nebulizer treatment but will instead of albuterol will use Xopenex since he is tachycardic.  Will give him steroids both systemic and inhaled.  Incentive spirometry.  Continue with oxygen.  Currently requiring 5 L.  He will be admitted to stepdown unit for  closer monitoring.  History of COVID-19 influenza and RSV PCR negative.  Will also do a respiratory viral panel.  #2. Lactic acidosis: Initial lactic acid level was normal.  Subsequently increased to 4.1.  He has been given fluid bolus.  We will recheck his lactic acid after the bolus.  ADDENDUM Lactic acid went up to 6.2.  Somewhat puzzling as the patient is otherwise hemodynamically stable.  Patient however has received only 500 mL of IV fluids.  Will give another 1 L bolus.  Mmetabolic panel was repeated.  It shows increase in anion gap with metabolic acidosis.  This is all likely due to hypovolemia and possible sepsis.  Do not suspect diabetic ketoacidosis at this time.  We will give fluid bolus as mentioned earlier and then recheck another metabolic panel this evening.  #3. History of COPD: Quit smoking 20 years ago.  See above.  #4. Chronic diastolic CHF: Seems to be reasonably well compensated at this time.  Continue to monitor.  #5. Hypothyroidism: Continue with home medications.  #6. Essential hypertension: Hold his home medications for now and monitor blood pressures closely.  #7. Diabetes mellitus type 2: Monitor CBGs.  Hold his [place].  SSI.  #8. Alcohol use: He drinks 1-2 beers daily.  Monitor for withdrawal symptoms.  Thiamine folic acid multivitamins.   DVT Prophylaxis: Lovenox Code Status: Full code Family Communication: Discussed with the patient and his son-in-law who was in the room Disposition: Hopefully return home when improved Consults called: None yet Admission Status: inpatient    Severity of Illness: The appropriate patient status for this patient is INPATIENT. Inpatient status is judged to be reasonable and necessary in order to provide the required intensity of service to ensure the patient's safety. The patient's presenting symptoms,  physical exam findings, and initial radiographic and laboratory data in the context of their chronic comorbidities is felt to  place them at high risk for further clinical deterioration. Furthermore, it is not anticipated that the patient will be medically stable for discharge from the hospital within 2 midnights of admission.   * I certify that at the point of admission it is my clinical judgment that the patient will require inpatient hospital care spanning beyond 2 midnights from the point of admission due to high intensity of service, high risk for further deterioration and high frequency of surveillance required.*   Further management decisions will depend on results of further testing and patient's response to treatment.   Etheleen Valtierra [employer]  [facility] operational officer on [email or web address]  03/28/2023, 2:19 PM

## 2023-03-28 NOTE — ED Triage Notes (Signed)
Pt c/o sob that has got worse since Friday;  pt states he checked his O2 sats last night and it was in the 70's; pt does not usually wear O2 but he used O2 all during the night  Pt c/o headache and lower back pain

## 2023-03-29 DIAGNOSIS — I10 Essential (primary) hypertension: Secondary | ICD-10-CM | POA: Diagnosis not present

## 2023-03-29 DIAGNOSIS — J9601 Acute respiratory failure with hypoxia: Secondary | ICD-10-CM | POA: Diagnosis not present

## 2023-03-29 DIAGNOSIS — J441 Chronic obstructive pulmonary disease with (acute) exacerbation: Secondary | ICD-10-CM

## 2023-03-29 LAB — RESPIRATORY PANEL BY PCR

## 2023-03-29 LAB — CBC
HCT: 37.6 % — ABNORMAL LOW (ref 39.0–52.0)
Hemoglobin: 12.3 g/dL — ABNORMAL LOW (ref 13.0–17.0)
MCH: 30.9 pg (ref 26.0–34.0)
MCHC: 32.7 g/dL (ref 30.0–36.0)
MCV: 94.5 fL (ref 80.0–100.0)
Platelets: 113 10*3/uL — ABNORMAL LOW (ref 150–400)
RBC: 3.98 MIL/uL — ABNORMAL LOW (ref 4.22–5.81)
RDW: 13.5 % (ref 11.5–15.5)
WBC: 4.8 10*3/uL (ref 4.0–10.5)
nRBC: 0 % (ref 0.0–0.2)

## 2023-03-29 LAB — COMPREHENSIVE METABOLIC PANEL
ALT: 18 U/L (ref 0–44)
AST: 16 U/L (ref 15–41)
Albumin: 3 g/dL — ABNORMAL LOW (ref 3.5–5.0)
Alkaline Phosphatase: 52 U/L (ref 38–126)
Anion gap: 10 (ref 5–15)
BUN: 24 mg/dL — ABNORMAL HIGH (ref 8–23)
CO2: 21 mmol/L — ABNORMAL LOW (ref 22–32)
Calcium: 7.9 mg/dL — ABNORMAL LOW (ref 8.9–10.3)
Chloride: 109 mmol/L (ref 98–111)
Creatinine, Ser: 0.86 mg/dL (ref 0.61–1.24)
GFR, Estimated: >60 mL/min (ref >60)
Glucose, Bld: 140 mg/dL — ABNORMAL HIGH (ref 70–99)
Potassium: 4.4 mmol/L (ref 3.5–5.1)
Sodium: 140 mmol/L (ref 135–145)
Total Bilirubin: 0.6 mg/dL (ref 0.3–1.2)
Total Protein: 6.4 g/dL — ABNORMAL LOW (ref 6.5–8.1)

## 2023-03-29 LAB — GLUCOSE, CAPILLARY
Glucose-Capillary: 146 mg/dL — ABNORMAL HIGH (ref 70–99)
Glucose-Capillary: 158 mg/dL — ABNORMAL HIGH (ref 70–99)
Glucose-Capillary: 171 mg/dL — ABNORMAL HIGH (ref 70–99)
Glucose-Capillary: 251 mg/dL — ABNORMAL HIGH (ref 70–99)
Glucose-Capillary: 164 mg/dL — ABNORMAL HIGH (ref 70–99)

## 2023-03-29 LAB — LACTIC ACID, PLASMA: Lactic Acid, Venous: 1.4 mmol/L (ref 0.5–1.9)

## 2023-03-29 MED ORDER — IPRATROPIUM BROMIDE 0.02 % IN SOLN
0.5000 mg | Freq: Three times a day (TID) | RESPIRATORY_TRACT | Status: DC
  Administered 2023-03-29 – 2023-04-01 (×9): 0.5 mg via RESPIRATORY_TRACT
  Filled 2023-03-29 (×9): qty 2.5

## 2023-03-29 MED ORDER — HYDRALAZINE HCL 20 MG/ML IJ SOLN
5.0000 mg | Freq: Four times a day (QID) | INTRAMUSCULAR | Status: DC | PRN
  Administered 2023-03-31: 5 mg via INTRAVENOUS
  Filled 2023-03-29: qty 1

## 2023-03-29 MED ORDER — LEVALBUTEROL HCL 0.63 MG/3ML IN NEBU
0.6300 mg | INHALATION_SOLUTION | Freq: Three times a day (TID) | RESPIRATORY_TRACT | Status: DC
  Administered 2023-03-29 – 2023-04-01 (×9): 0.63 mg via RESPIRATORY_TRACT
  Filled 2023-03-29 (×8): qty 3

## 2023-03-29 MED ORDER — INSULIN ASPART 100 UNIT/ML IJ SOLN: 0.0000 [IU] | Freq: Every day | INTRAMUSCULAR | Status: DC

## 2023-03-29 MED ORDER — INSULIN ASPART 100 UNIT/ML IJ SOLN
0.0000 [IU] | Freq: Three times a day (TID) | INTRAMUSCULAR | Status: DC
  Administered 2023-03-29: 11 [IU] via SUBCUTANEOUS
  Administered 2023-03-30: 7 [IU] via SUBCUTANEOUS
  Administered 2023-03-30: 4 [IU] via SUBCUTANEOUS
  Administered 2023-03-30 – 2023-03-31 (×2): 7 [IU] via SUBCUTANEOUS
  Administered 2023-03-31: 11 [IU] via SUBCUTANEOUS
  Administered 2023-03-31 – 2023-04-01 (×2): 7 [IU] via SUBCUTANEOUS
  Administered 2023-04-01: 4 [IU] via SUBCUTANEOUS

## 2023-03-29 NOTE — Plan of Care (Signed)
  Problem: Coping: Goal: Ability to adjust to condition or change in health will improve Outcome: Progressing   

## 2023-03-29 NOTE — Progress Notes (Addendum)
[facility] PROGRESS NOTE   Christopher Randolph [id number] DOB: [birth year] DOA: 03/28/2023  PCP: Curlene Labrum, MD  Brief History/Interval Summary:  76 y.o. male with a past medical history of chronic diastolic CHF, carotid artery disease, diabetes mellitus type 2, essential hypertension, hypothyroidism who was in his usual state of health till last week when he went on a trip with his wife.  After they returned home his wife felt sick with cough, congestion shortness of breath.  She was evaluated in the emergency department and due to hypoxia she was hospitalized.  She was eventually found to have rhinovirus.  His symptoms got worse in the last few days prior to admission.  At evaluation in the ED he was noted to be hypoxic with saturations in the 70s on room air.  He was hospitalized for further management.  Consultants: None  Procedures: None    Subjective/Interval History: Patient feels about the same as yesterday with no significant improvement.  Still has a cough.  Some discomfort in the chest when he coughs.  No nausea or vomiting.     Assessment/Plan:  Acute respiratory failure with hypoxia Likely due to viral bronchitis.  COVID-19 influenza and RSV PCR were negative.  Respiratory viral panel is pending. CT angiogram was negative for pulmonary embolism. Patient was requiring 5 L of oxygen.  He has been weaned down to 4 L.  Saturations are in the late 80s to early 90s.  Respiratory effort seems to be slightly better today compared to yesterday. Continue with steroids, nebulizer treatments, antibiotics.  Lactic acidosis Lactic acid noted to be significantly elevated yesterday.  Initially 4.1 and then 6.2.  Patient given multiple fluid boluses.  Will repeat lactic acid level this morning.  He appears to be hemodynamically stable.  History of COPD with perhaps mild exacerbation He quit smoking 20 years ago.  Currently on nebulizer treatments steroids and antibiotics  as discussed above.  Chronic diastolic CHF Seems to be reasonably well compensated.  Continue to monitor volume status closely.  Hypothyroidism Continue with home medications.  Essential hypertension Holding his antihypertensives.  Monitor blood pressure closely.  Elevated readings noted.  Use hydralazine as needed.  Will resume lisinopril tomorrow if blood work remains stable.  Diabetes mellitus type 2 Holding [place].  Continue SSI.  Elevated glucose levels are secondary to steroid. May need to use basal insulin.  Continue to monitor for now.  Alcohol use Drinks 1-2 beers daily.  Monitor for withdrawal symptoms.  Continue thiamine, folic acid and multivitamins.  Mild thrombocytopenia Slightly low platelet counts noted.  Monitor for now.  No evidence of bleeding.  DVT Prophylaxis: Lovenox Code Status: Full code Family Communication: Discussed with patient Disposition Plan: Anticipate discharge home when improved.  Status is: Inpatient Remains inpatient appropriate because: Acute respiratory failure with hypoxia      Medications: Scheduled:  aspirin EC  81 mg Oral Daily   budesonide (PULMICORT) nebulizer solution  0.25 mg Nebulization BID   Chlorhexidine Gluconate Cloth  6 each Topical Daily   clopidogrel  75 mg Oral Daily   enoxaparin (LOVENOX) injection  40 mg Subcutaneous [id number]   folic acid  1 mg Oral Daily   insulin aspart  0-15 Units Subcutaneous Q4H   ipratropium  0.5 mg Nebulization QID   ketorolac  1 drop Left Eye QID   latanoprost  1 drop Left Eye QHS   levalbuterol  0.63 mg Nebulization QID   levothyroxine  150 mcg Oral Once per day  on Sun Mon Tue Thu Fri   [START ON 03/31/2023] levothyroxine  175 mcg Oral Once per day on Wed Sat   methylPREDNISolone (SOLU-MEDROL) injection  60 mg Intravenous Q12H   multivitamin with minerals  1 tablet Oral Daily   prednisoLONE acetate  1 drop Left Eye q AM   tamsulosin  0.8 mg Oral QHS   thiamine  100 mg Oral Daily   Or    thiamine  100 mg Intravenous Daily   Continuous:  sodium chloride 100 mL/hr at 03/29/23 0946   levofloxacin (LEVAQUIN) IV     [id number] **OR** acetaminophen, guaiFENesin-dextromethorphan, hydrALAZINE, HYDROcodone-acetaminophen, levalbuterol, LORazepam, ondansetron **OR** ondansetron (ZOFRAN) IV  Antibiotics: Anti-infectives (From admission, onward)    Start     Dose/Rate Route Frequency Ordered Stop   03/29/23 1600  levofloxacin (LEVAQUIN) IVPB 500 mg        500 mg 100 mL/hr over 60 Minutes Intravenous Every 24 hours 03/28/23 1421 04/02/23 1559   03/28/23 1345  levofloxacin (LEVAQUIN) IVPB 750 mg  Status:  Discontinued        750 mg 100 mL/hr over 90 Minutes Intravenous  Once 03/28/23 1339 03/29/23 0913       Objective:  Vital Signs  Vitals:   03/29/23 0732 03/29/23 0800 03/29/23 0811 03/29/23 0900  BP:  (!) 144/68  (!) 157/65  Pulse:  80  60  Resp:    15  Temp:   97.6 F (36.4 C)   TempSrc:   Oral   SpO2: 90% 98%  91%  Weight:      Height:        Intake/Output Summary (Last 24 hours) at 03/29/2023 1004 Last data filed at 03/29/2023 0946 Gross per 24 hour  Intake 1655.28 ml  Output 2700 ml  Net -1044.72 ml   Filed Weights   03/28/23 1015 03/28/23 1607  Weight: 75 kg 76.7 kg    General appearance: Awake alert.  In no distress Resp: Remains tachypneic.  No use of accessory muscles.  Few crackles at the bases.  Scattered wheezing.  No rhonchi.  Coarse breath sounds. Cardio: S1-S2 is normal regular.  No S3-S4.  No rubs murmurs or bruit GI: Abdomen is soft.  Nontender nondistended.  Bowel sounds are present normal.  No masses organomegaly Extremities: No edema.  Full range of motion of lower extremities. Neurologic: Alert and oriented x3.  No focal neurological deficits.    Lab Results:  Data Reviewed: I have personally reviewed following labs and reports of the imaging studies  CBC: Recent Labs  Lab 03/28/23 1032 03/29/23 0305  WBC 8.4 4.8   NEUTROABS 6.8  --   HGB 14.4 12.3*  HCT 43.2 37.6*  MCV 92.9 94.5  PLT 125* 113*    Basic Metabolic Panel: Recent Labs  Lab 03/28/23 1032 03/28/23 1353 03/28/23 1818 03/29/23 0305  NA 139 137 134* 140  K 4.0 3.8 4.0 4.4  CL 104 103 104 109  CO2 24 18* 20* 21*  GLUCOSE 146* 235* 309* 140*  BUN 21 23 23  24*  CREATININE 1.08 1.32* 1.11 0.86  CALCIUM 8.7* 8.4* 7.8* 7.9*    GFR: Estimated Creatinine Clearance: 72.9 mL/min (by C-G formula based on SCr of 0.86 mg/dL).  Liver Function Tests: Recent Labs  Lab 03/28/23 1353 03/29/23 0305  AST 25 16  ALT 21 18  ALKPHOS 59 52  BILITOT 1.3* 0.6  PROT 6.9 6.4*  ALBUMIN 3.5 3.0*     Coagulation Profile: Recent Labs  Lab 03/28/23  1353  INR 1.3*     CBG: Recent Labs  Lab 03/28/23 1610 03/28/23 2000 03/28/23 2348 03/29/23 0414 03/29/23 0810  GLUCAP 230* 240* 138* 146* 158*     Recent Results (from the past 240 hour(s))  Resp panel by RT-PCR (RSV, Flu A&B, Covid) Anterior Nasal Swab     Status: None   Collection Time: 03/26/23 11:13 AM   Specimen: Anterior Nasal Swab  Result Value Ref Range Status   SARS Coronavirus 2 by RT PCR NEGATIVE NEGATIVE Final    Comment: (NOTE) SARS-CoV-2 target nucleic acids are NOT DETECTED.  The SARS-CoV-2 RNA is generally detectable in upper respiratory specimens during the acute phase of infection. The lowest concentration of SARS-CoV-2 viral copies this assay can detect is 138 copies/mL. A negative result does not preclude SARS-Cov-2 infection and should not be used as the sole basis for treatment or other patient management decisions. A negative result may occur with  improper specimen collection/handling, submission of specimen other than nasopharyngeal swab, presence of viral mutation(s) within the areas targeted by this assay, and inadequate number of viral copies(<138 copies/mL). A negative result must be combined with clinical observations, patient history, and  epidemiological information. The expected result is Negative.  Fact Sheet for Patients:  EntrepreneurPulse.com.au  Fact Sheet for Healthcare Providers:  IncredibleEmployment.be  This test is no t yet approved or cleared by the [place] FDA and  has been authorized for detection and/or diagnosis of SARS-CoV-2 by FDA under an Emergency Use Authorization (EUA). This EUA will remain  in effect (meaning this test can be used) for the duration of the COVID-19 declaration under Section 564(b)(1) of the Act, 21 U.S.C.section 360bbb-3(b)(1), unless the authorization is terminated  or revoked sooner.       Influenza A by PCR NEGATIVE NEGATIVE Final   Influenza B by PCR NEGATIVE NEGATIVE Final    Comment: (NOTE) The Xpert Xpress SARS-CoV-2/FLU/RSV plus assay is intended as an aid in the diagnosis of influenza from Nasopharyngeal swab specimens and should not be used as a sole basis for treatment. Nasal washings and aspirates are unacceptable for Xpert Xpress SARS-CoV-2/FLU/RSV testing.  Fact Sheet for Patients: EntrepreneurPulse.com.au  Fact Sheet for Healthcare Providers: IncredibleEmployment.be  This test is not yet approved or cleared by the [place] FDA and has been authorized for detection and/or diagnosis of SARS-CoV-2 by FDA under an Emergency Use Authorization (EUA). This EUA will remain in effect (meaning this test can be used) for the duration of the COVID-19 declaration under Section 564(b)(1) of the Act, 21 U.S.C. section 360bbb-3(b)(1), unless the authorization is terminated or revoked.     Resp Syncytial Virus by PCR NEGATIVE NEGATIVE Final    Comment: (NOTE) Fact Sheet for Patients: EntrepreneurPulse.com.au  Fact Sheet for Healthcare Providers: IncredibleEmployment.be  This test is not yet approved or cleared by the [place] FDA and has been  authorized for detection and/or diagnosis of SARS-CoV-2 by FDA under an Emergency Use Authorization (EUA). This EUA will remain in effect (meaning this test can be used) for the duration of the COVID-19 declaration under Section 564(b)(1) of the Act, 21 U.S.C. section 360bbb-3(b)(1), unless the authorization is terminated or revoked.  Performed at [facility], [address], [place], [place] [address]   Culture, blood (routine x 2)     Status: None (Preliminary result)   Collection Time: 03/28/23 10:40 AM   Specimen: Left Antecubital; Blood  Result Value Ref Range Status   Specimen Description LEFT ANTECUBITAL  Final  Special Requests   Final    BOTTLES DRAWN AEROBIC AND ANAEROBIC Blood Culture adequate volume   Culture   Final    NO GROWTH < 24 HOURS Performed at [facility], [address] [place], [place] [address]    Report Status PENDING  Incomplete  Resp panel by RT-PCR (RSV, Flu A&B, Covid) Anterior Nasal Swab     Status: None   Collection Time: 03/28/23 10:42 AM   Specimen: Anterior Nasal Swab  Result Value Ref Range Status   SARS Coronavirus 2 by RT PCR NEGATIVE NEGATIVE Final    Comment: (NOTE) SARS-CoV-2 target nucleic acids are NOT DETECTED.  The SARS-CoV-2 RNA is generally detectable in upper respiratory specimens during the acute phase of infection. The lowest concentration of SARS-CoV-2 viral copies this assay can detect is 138 copies/mL. A negative result does not preclude SARS-Cov-2 infection and should not be used as the sole basis for treatment or other patient management decisions. A negative result may occur with  improper specimen collection/handling, submission of specimen other than nasopharyngeal swab, presence of viral mutation(s) within the areas targeted by this assay, and inadequate number of viral copies(<138 copies/mL). A negative result must be combined with clinical observations, patient history, and epidemiological information. The expected  result is Negative.  Fact Sheet for Patients:  EntrepreneurPulse.com.au  Fact Sheet for Healthcare Providers:  IncredibleEmployment.be  This test is no t yet approved or cleared by the [place] FDA and  has been authorized for detection and/or diagnosis of SARS-CoV-2 by FDA under an Emergency Use Authorization (EUA). This EUA will remain  in effect (meaning this test can be used) for the duration of the COVID-19 declaration under Section 564(b)(1) of the Act, 21 U.S.C.section 360bbb-3(b)(1), unless the authorization is terminated  or revoked sooner.       Influenza A by PCR NEGATIVE NEGATIVE Final   Influenza B by PCR NEGATIVE NEGATIVE Final    Comment: (NOTE) The Xpert Xpress SARS-CoV-2/FLU/RSV plus assay is intended as an aid in the diagnosis of influenza from Nasopharyngeal swab specimens and should not be used as a sole basis for treatment. Nasal washings and aspirates are unacceptable for Xpert Xpress SARS-CoV-2/FLU/RSV testing.  Fact Sheet for Patients: EntrepreneurPulse.com.au  Fact Sheet for Healthcare Providers: IncredibleEmployment.be  This test is not yet approved or cleared by the [place] FDA and has been authorized for detection and/or diagnosis of SARS-CoV-2 by FDA under an Emergency Use Authorization (EUA). This EUA will remain in effect (meaning this test can be used) for the duration of the COVID-19 declaration under Section 564(b)(1) of the Act, 21 U.S.C. section 360bbb-3(b)(1), unless the authorization is terminated or revoked.     Resp Syncytial Virus by PCR NEGATIVE NEGATIVE Final    Comment: (NOTE) Fact Sheet for Patients: EntrepreneurPulse.com.au  Fact Sheet for Healthcare Providers: IncredibleEmployment.be  This test is not yet approved or cleared by the [place] FDA and has been authorized for detection and/or diagnosis of  SARS-CoV-2 by FDA under an Emergency Use Authorization (EUA). This EUA will remain in effect (meaning this test can be used) for the duration of the COVID-19 declaration under Section 564(b)(1) of the Act, 21 U.S.C. section 360bbb-3(b)(1), unless the authorization is terminated or revoked.  Performed at [facility], [address], [place], [place] [address]   Culture, blood (routine x 2)     Status: None (Preliminary result)   Collection Time: 03/28/23 11:13 AM   Specimen: BLOOD  Result Value Ref Range Status  Specimen Description BLOOD RIGHT ANTECUBITAL  Final   Special Requests   Final    BOTTLES DRAWN AEROBIC AND ANAEROBIC Blood Culture adequate volume   Culture   Final    NO GROWTH < 12 HOURS Performed at [facility], [address], [place], [place] [address]    Report Status PENDING  Incomplete  MRSA Next Gen by PCR, Nasal     Status: None   Collection Time: 03/28/23  4:29 PM   Specimen: Nasal Mucosa; Nasal Swab  Result Value Ref Range Status   MRSA by PCR Next Gen NOT DETECTED NOT DETECTED Final    Comment: (NOTE) The GeneXpert MRSA Assay (FDA approved for NASAL specimens only), is one component of a comprehensive MRSA colonization surveillance program. It is not intended to diagnose MRSA infection nor to guide or monitor treatment for MRSA infections. Test performance is not FDA approved in patients less than 19 years old. Performed at [facility], [address] [place], [place] [address]       Radiology Studies: CT Angio Chest Pulmonary Embolism (PE) W or WO Contrast  Result Date: 03/28/2023 CLINICAL DATA:  PE suspected, worsening shortness of breath EXAM: CT ANGIOGRAPHY CHEST WITH CONTRAST TECHNIQUE: Multidetector CT imaging of the chest was performed using the standard protocol during bolus administration of intravenous contrast. Multiplanar CT image reconstructions and MIPs were obtained to evaluate the vascular anatomy. RADIATION DOSE REDUCTION: This exam was  performed according to the departmental dose-optimization program which includes automated exposure control, adjustment of the mA and/or kV according to patient size and/or use of iterative reconstruction technique. CONTRAST:  9mL OMNIPAQUE IOHEXOL 350 MG/ML SOLN COMPARISON:  03/30/2022 FINDINGS: Cardiovascular: Examination for pulmonary embolism is somewhat limited by breath motion artifact, particularly in the lung bases. Within this limitation, negative examination for pulmonary embolism through the proximal segmental pulmonary arterial level. Normal heart size. Three-vessel coronary artery calcifications. No pericardial effusion. Aortic atherosclerosis. Mediastinum/Nodes: No enlarged mediastinal, hilar, or axillary lymph nodes. Thyroid gland, trachea, and esophagus demonstrate no significant findings. Lungs/Pleura: Heterogeneous consolidative airspace opacity throughout the bilateral lung bases, worst in the dependent left lung base (series 6, image 117). Probable underlying scarring and volume loss of the right lung base (series 6, image 84). No pleural effusion or pneumothorax. Upper Abdomen: No acute abnormality. Musculoskeletal: No chest wall abnormality. No acute osseous findings. Review of the MIP images confirms the above findings. IMPRESSION: 1. Examination for pulmonary embolism is somewhat limited by breath motion artifact, particularly in the lung bases. Within this limitation, negative examination for pulmonary embolism through the proximal segmental pulmonary arterial level. 2. Heterogeneous consolidative airspace opacity throughout the bilateral lung bases, worst in the dependent left lung base. Findings are consistent with multifocal infection or aspiration. 3. Probable underlying scarring and volume loss of the right lung base. 4. Coronary artery disease. Aortic Atherosclerosis (ICD10-I70.0). Electronically Signed   By: Delanna Ahmadi M.D.   On: 03/28/2023 14:37   DG Chest Port 1 View  Result  Date: 03/28/2023 CLINICAL DATA:  77 year old male with history of shortness of breath. EXAM: PORTABLE CHEST 1 VIEW COMPARISON:  Chest x-ray 05/27/2022. FINDINGS: Lung volumes are low. Widespread interstitial prominence and peribronchial cuffing. No consolidative airspace disease. No pleural effusions. No pneumothorax. No pulmonary nodule or mass noted. Pulmonary vasculature and the cardiomediastinal silhouette are within normal limits. Atherosclerotic calcifications in the thoracic aorta. IMPRESSION: 1. Diffuse interstitial prominence and peribronchial cuffing, which may suggest an acute bronchitis. 2. Aortic atherosclerosis. Electronically Signed  By: Vinnie Langton M.D.   On: 03/28/2023 10:52       LOS: 1 day   [facility] Pager on [email or web address]  03/29/2023, 10:04 AM

## 2023-03-29 NOTE — Progress Notes (Signed)
Pt had o2 out of his nose, sitting on side of bed about to use urinal o2 back on pt winded treatment given spo2 increased 97%

## 2023-03-30 ENCOUNTER — Inpatient Hospital Stay (HOSPITAL_COMMUNITY): Payer: Medicare Other

## 2023-03-30 DIAGNOSIS — J206 Acute bronchitis due to rhinovirus: Secondary | ICD-10-CM

## 2023-03-30 DIAGNOSIS — J9601 Acute respiratory failure with hypoxia: Secondary | ICD-10-CM | POA: Diagnosis not present

## 2023-03-30 DIAGNOSIS — I10 Essential (primary) hypertension: Secondary | ICD-10-CM | POA: Diagnosis not present

## 2023-03-30 LAB — CBC
HCT: 38.2 % — ABNORMAL LOW (ref 39.0–52.0)
Hemoglobin: 12.4 g/dL — ABNORMAL LOW (ref 13.0–17.0)
MCH: 30.5 pg (ref 26.0–34.0)
MCHC: 32.5 g/dL (ref 30.0–36.0)
MCV: 93.9 fL (ref 80.0–100.0)
Platelets: 137 10*3/uL — ABNORMAL LOW (ref 150–400)
RBC: 4.07 MIL/uL — ABNORMAL LOW (ref 4.22–5.81)
RDW: 13.3 % (ref 11.5–15.5)
WBC: 7.7 10*3/uL (ref 4.0–10.5)
nRBC: 0 % (ref 0.0–0.2)

## 2023-03-30 LAB — BASIC METABOLIC PANEL
Anion gap: 6 (ref 5–15)
BUN: 30 mg/dL — ABNORMAL HIGH (ref 8–23)
CO2: 25 mmol/L (ref 22–32)
Calcium: 8.2 mg/dL — ABNORMAL LOW (ref 8.9–10.3)
Chloride: 107 mmol/L (ref 98–111)
Creatinine, Ser: 0.92 mg/dL (ref 0.61–1.24)
GFR, Estimated: >60 mL/min (ref >60)
Glucose, Bld: 186 mg/dL — ABNORMAL HIGH (ref 70–99)
Potassium: 4.7 mmol/L (ref 3.5–5.1)
Sodium: 138 mmol/L (ref 135–145)

## 2023-03-30 LAB — HEMOGLOBIN A1C
Hgb A1c MFr Bld: 6.6 % — ABNORMAL HIGH (ref 4.8–5.6)
Mean Plasma Glucose: 143 mg/dL

## 2023-03-30 LAB — GLUCOSE, CAPILLARY
Glucose-Capillary: 178 mg/dL — ABNORMAL HIGH (ref 70–99)
Glucose-Capillary: 211 mg/dL — ABNORMAL HIGH (ref 70–99)
Glucose-Capillary: 211 mg/dL — ABNORMAL HIGH (ref 70–99)
Glucose-Capillary: 160 mg/dL — ABNORMAL HIGH (ref 70–99)

## 2023-03-30 MED ORDER — LISINOPRIL 10 MG PO TABS
20.0000 mg | ORAL_TABLET | Freq: Every day | ORAL | Status: DC
  Administered 2023-03-30 – 2023-04-01 (×3): 20 mg via ORAL
  Filled 2023-03-30 (×4): qty 2

## 2023-03-30 MED ORDER — FUROSEMIDE 10 MG/ML IJ SOLN
20.0000 mg | Freq: Once | INTRAMUSCULAR | Status: AC
  Administered 2023-03-30: 20 mg via INTRAVENOUS
  Filled 2023-03-30: qty 2

## 2023-03-30 NOTE — TOC Initial Note (Signed)
Transition of Care [facility]) - Initial/Assessment Note    Patient Details  Name: Christopher Randolph MRN: [id number] Date of Birth: 09-11-1946  [facility]) CM/SW Contact:    Shade Flood, LCSW Phone Number: 03/30/2023, 11:49 AM  Clinical Narrative:                  Pt admitted from home. He is high risk for readmission. MD anticipating dc in 2-3 days.  Spoke with pt today to assess. Per pt, he lives with his wife at home and he is independent in ADLs at baseline. Pt states that he is able to get to appointments and obtain medications as needed.  At this time, pt is not anticipating any new TOC needs at dc. Pt is currently on 2L O2 and he does not have O2 at baseline. TOC will follow and continue to assess and assist with dc planning.  Expected Discharge Plan: Home/Self Care Barriers to Discharge: Continued Medical Work up   Patient Goals and CMS Choice Patient states their goals for this hospitalization and ongoing recovery are:: go home without O2          Expected Discharge Plan and Services In-house Referral: Clinical Social Work     Living arrangements for the past 2 months: Single Family Home                                      Prior Living Arrangements/Services Living arrangements for the past 2 months: Single Family Home Lives with:: Spouse Patient language and need for interpreter reviewed:: Yes Do you feel safe going back to the place where you live?: Yes      Need for Family Participation in Patient Care: No (Comment)     Criminal Activity/Legal Involvement Pertinent to Current Situation/Hospitalization: No - Comment as needed  Activities of Daily Living Home Assistive Devices/Equipment: None ADL Screening (condition at time of admission) Patient's cognitive ability adequate to safely complete daily activities?: Yes Is the patient deaf or have difficulty hearing?: No Does the patient have difficulty seeing, even when wearing  glasses/contacts?: No Does the patient have difficulty concentrating, remembering, or making decisions?: No Patient able to express need for assistance with ADLs?: Yes Does the patient have difficulty dressing or bathing?: No Independently performs ADLs?: Yes (appropriate for developmental age) Does the patient have difficulty walking or climbing stairs?: No Weakness of Legs: None Weakness of Arms/Hands: None  Permission Sought/Granted                  Emotional Assessment   Attitude/Demeanor/Rapport: Engaged Affect (typically observed): Pleasant Orientation: : Oriented to Self, Oriented to Place, Oriented to  Time, Oriented to Situation Alcohol / Substance Use: Alcohol Use Psych Involvement: No (comment)  Admission diagnosis:  Acute respiratory failure with hypoxia [J96.01] Patient Active Problem List   Diagnosis Date Noted   Acute respiratory failure with hypoxia 03/28/2023   Chest pain 05/27/2022   Congestive heart failure (CHF) 05/27/2022   COPD with chronic bronchitis and emphysema 05/06/2022   Hypomagnesemia 04/01/2022   Acute on chronic diastolic CHF (congestive heart failure) 03/31/2022   Chronic respiratory failure with hypoxia 03/30/2022   DMII (diabetes mellitus, type 2) 03/30/2022   Carotid stenosis 03/25/2022   TIA (transient ischemic attack) 02/27/2022   Biceps tendon rupture, proximal, right, initial encounter 05/16/2020   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 [place] [id number] Phone: [phone] Fax: [phone]     Social Determinants of Health  (SDOH) Social History: SDOH Screenings   Food Insecurity: No Food Insecurity (03/28/2023)  Housing: Low Risk  (03/28/2023)  Transportation Needs: No Transportation Needs (03/28/2023)  Utilities: Not At Risk (03/28/2023)  Tobacco Use: Medium Risk (03/28/2023)   SDOH Interventions:     Readmission Risk Interventions    03/30/2023   11:48 AM 03/26/2022   10:24 AM  Readmission Risk Prevention Plan  Post Dischage Appt  Complete  Medication Screening  Complete  Transportation Screening Complete Complete  HRI or [place] Complete   Social Work Consult for [place] Planning/Counseling Complete   Palliative Care Screening Not Applicable   Medication Review Press photographer) Complete

## 2023-03-30 NOTE — Progress Notes (Signed)
   03/30/23 1440  Assess: MEWS Score  Temp 98 F (36.7 C)  BP (!) 143/62  MAP (mmHg) 85  Pulse Rate 71  Resp (!) 28  SpO2 91 %  O2 Device Nasal Cannula  O2 Flow Rate (L/min) 2 L/min  Assess: MEWS Score  MEWS Temp 0  MEWS Systolic 0  MEWS Pulse 0  MEWS RR 2  MEWS LOC 0  MEWS Score 2  MEWS Score Color Yellow  Assess: if the MEWS score is Yellow or Red  Were vital signs taken at a resting state? Yes  Focused Assessment Change from prior assessment (see assessment flowsheet)  Does the patient meet 2 or more of the SIRS criteria? No  MEWS guidelines implemented  Yes, yellow  Treat  MEWS Interventions Considered administering scheduled or prn medications/treatments as ordered  Take Vital Signs  Increase Vital Sign Frequency  Yellow: Q2hr x1, continue Q4hrs until patient remains green for 12hrs  Escalate  MEWS: Escalate Yellow: Discuss with charge nurse and consider notifying provider and/or RRT  Notify: Charge Nurse/RN  Name of Charge Nurse/RN Notified Brandie, RN  Assess: SIRS CRITERIA  SIRS Temperature  0  SIRS Pulse 0  SIRS Respirations  1  SIRS WBC 0  SIRS Score Sum  1

## 2023-03-30 NOTE — Progress Notes (Signed)
Patient became yellow mews, patient's resp elevated, patient's work of breathing increased and change to RLL lung sounds. Notified physician with primary nurse Jerrye Beavers, Dr. [place] Pink aware, no new orders at this time.

## 2023-03-30 NOTE — Evaluation (Signed)
Physical Therapy Evaluation Patient Details Name: Christopher Randolph MRN: [id number] DOB: December 28, 1946 Today's Date: 03/30/2023  History of Present Illness  77 y.o. male with a past medical history of chronic diastolic CHF, carotid artery disease, diabetes mellitus type 2, essential hypertension, hypothyroidism who was in his usual state of health till last week when he went on a trip with his wife.  After they returned home his wife felt sick with cough, congestion shortness of breath.  She was evaluated in the emergency department and due to hypoxia she was hospitalized.  She was eventually found to have rhinovirus.  His symptoms got worse in the last few days prior to admission.  At evaluation in the ED he was noted to be hypoxic with saturations in the 70s on room air.  He was hospitalized for further management.   Clinical Impression  Patient evaluated by Physical Therapy with no further acute PT needs identified. All education has been completed and the patient has no further questions. Patient reports completely independent prior to admission. Patient reports he has been walking about in his room with the 2 L O2 and sits at bedside for periods of time as well. Patient is modified independent to supervision for all mobility. Patient ambulated modified independent to supervision assistance with a slow, steady cadence without use of an assistive device. Noted no arm swing and decreased step and stride length during gait. Patient limited by fatigue and shortness of breath while on 2 LPM throughout session. See below for any follow-up Physical Therapy or equipment needs. PT is signing off. Thank you for this referral.        Recommendations for follow up therapy are one component of a multi-disciplinary discharge planning process, led by the attending physician.  Recommendations may be updated based on patient status, additional functional criteria and insurance authorization.  Follow Up Recommendations        Assistance Recommended at Discharge PRN  Patient can return home with the following       Equipment Recommendations None recommended by PT  Recommendations for Other Services       Functional Status Assessment Patient has had a recent decline in their functional status and demonstrates the ability to make significant improvements in function in a reasonable and predictable amount of time.     Precautions / Restrictions Precautions Precaution Comments: patient reports no falls in the last six months Restrictions Weight Bearing Restrictions: No      Mobility  Bed Mobility Overal bed mobility: Independent    Transfers Overall transfer level: Modified independent Equipment used: None    Ambulation/Gait Ambulation/Gait assistance: Supervision, Modified independent (Device/Increase time) Gait Distance (Feet): 250 Feet Assistive device: None Gait Pattern/deviations: Step-through pattern, Decreased step length - right, Decreased step length - left, Decreased stride length, Wide base of support Gait velocity: decreased     General Gait Details: slow, steady cadence without use of assistive device; noted no armswing during gait; limited by fatigue and shortness of breath; on 2 LPM throughout session  Stairs      Wheelchair Mobility    Modified Rankin (Stroke Patients Only)       Balance Overall balance assessment: Modified Independent         Pertinent Vitals/Pain Pain Assessment Pain Assessment: No/denies pain    Home Living Family/patient expects to be discharged to:: Private residence Living Arrangements: Spouse/significant other Available Help at Discharge: Family Type of Home: House Home Access: Stairs to enter Entrance Stairs-Rails: None Entrance Stairs-Number of  Steps: 2   Home Layout: One level Home Equipment: Crutches;Rolling Walker (2 wheels);Cane - quad;Cane - single point      Prior Function Prior Level of Function :  Independent/Modified Independent     Mobility Comments: independent with no assistive devices ADLs Comments: independent without assistive devices     Hand Dominance        Extremity/Trunk Assessment   Upper Extremity Assessment Upper Extremity Assessment: Overall WFL for tasks assessed    Lower Extremity Assessment Lower Extremity Assessment: Overall WFL for tasks assessed    Cervical / Trunk Assessment Cervical / Trunk Assessment: Normal  Communication   Communication: No difficulties  Cognition Arousal/Alertness: Awake/alert Behavior During Therapy: WFL for tasks assessed/performed Overall Cognitive Status: Within Functional Limits for tasks assessed        General Comments      Exercises     Assessment/Plan    PT Assessment Patient does not need any further PT services  PT Problem List         PT Treatment Interventions      PT Goals (Current goals can be found in the Care Plan section)       Frequency          AM-PAC PT "6 Clicks" Mobility  Outcome Measure Help needed turning from your back to your side while in a flat bed without using bedrails?: None Help needed moving from lying on your back to sitting on the side of a flat bed without using bedrails?: None Help needed moving to and from a bed to a chair (including a wheelchair)?: A Little Help needed standing up from a chair using your arms (e.g., wheelchair or bedside chair)?: A Little Help needed to walk in hospital room?: A Little Help needed climbing 3-5 steps with a railing? : A Little 6 Click Score: 20    End of Session Equipment Utilized During Treatment: Gait belt Activity Tolerance: Patient tolerated treatment well;Patient limited by fatigue Patient left: in bed;with call bell/phone within reach Nurse Communication: Mobility status PT Visit Diagnosis: Difficulty in walking, not elsewhere classified (R26.2)    Time: [id number] PT Time Calculation (min) (ACUTE ONLY): 24  min   Charges:   PT Evaluation $PT Eval Low Complexity: 1 Low PT Treatments $Therapeutic Activity: 8-22 mins        Floria Raveling. Hartnett-Rands, MS, PT Per [place] [phone]  Pamala Hurry  Hartnett-Rands 03/30/2023, 1:41 PM

## 2023-03-30 NOTE — Progress Notes (Signed)
[facility] PROGRESS NOTE   CEQUAN PEDERSON [id number] DOB: 1946-08-17 DOA: 03/28/2023  PCP: Curlene Labrum, MD  Brief History/Interval Summary:  77 y.o. male with a past medical history of chronic diastolic CHF, carotid artery disease, diabetes mellitus type 2, essential hypertension, hypothyroidism who was in his usual state of health till last week when he went on a trip with his wife.  After they returned home his wife felt sick with cough, congestion shortness of breath.  She was evaluated in the emergency department and due to hypoxia she was hospitalized.  She was eventually found to have rhinovirus.  His symptoms got worse in the last few days prior to admission.  At evaluation in the ED he was noted to be hypoxic with saturations in the 70s on room air.  He was hospitalized for further management.  Consultants: None  Procedures: None    Subjective/Interval History: Patient mentioned that he had a rough night due to coughing episodes.  Denies any chest pain.  No nausea vomiting.  Shortness of breath is slightly better.     Assessment/Plan:  Acute respiratory failure with hypoxia/rhinovirus bronchitis Likely due to rhinovirus bronchitis.  COVID-19 influenza and RSV PCR were negative.  Respiratory viral panel is for rhinovirus. CT angiogram was negative for pulmonary embolism. Patient was initially requiring 5 L of oxygen.  1 to 2 to 3 L of oxygen. Continues to have significant cough.  Will repeat chest x-ray today.  Patient was told that this will take several days for him to get better. Continue with steroids nebulizer treatments and Levaquin for now.   Incentive spirometer.  Lactic acidosis Lactic acid noted to be significantly elevated on the day of admission.  Initially 4.1 and then 6.2.  Patient given multiple fluid boluses.  Improved as of yesterday morning.  Remains hemodynamically stable.    History of COPD with perhaps mild exacerbation He quit  smoking 20 years ago.  Currently on nebulizer treatments steroids and antibiotics as discussed above.  Chronic diastolic CHF Was given a lot of volume initially due to lactic acidosis.  Volume status seems to be still stable.  Will check chest x-ray today to see if he needs diuresis.    Hypothyroidism Continue with home medications.  Essential hypertension High blood pressure readings noted occasionally.  Okay to resume lisinopril.    Diabetes mellitus type 2 Holding [place].  Continue SSI.  Elevated glucose levels are secondary to steroid. May need to use basal insulin.  Continue to monitor for now.  Alcohol use Drinks 1-2 beers daily.  No withdrawal symptoms noted yet.  He remains on CIWA protocol.   Continue thiamine, folic acid and multivitamins.  Mild thrombocytopenia Slightly low platelet counts noted.  Seems to be improved this morning.  No evidence for bleeding.  Normocytic anemia No evidence of bleeding.  Monitor.    DVT Prophylaxis: Lovenox Code Status: Full code Family Communication: Discussed with patient Disposition Plan: Anticipate discharge home when improved.  Mobilize  Status is: Inpatient Remains inpatient appropriate because: Acute respiratory failure with hypoxia      Medications: Scheduled:  aspirin EC  81 mg Oral Daily   budesonide (PULMICORT) nebulizer solution  0.25 mg Nebulization BID   Chlorhexidine Gluconate Cloth  6 each Topical Daily   clopidogrel  75 mg Oral Daily   enoxaparin (LOVENOX) injection  40 mg Subcutaneous [id number]   folic acid  1 mg Oral Daily   insulin aspart  0-20 Units Subcutaneous TID WC  insulin aspart  0-5 Units Subcutaneous QHS   ipratropium  0.5 mg Nebulization TID   ketorolac  1 drop Left Eye QID   latanoprost  1 drop Left Eye QHS   levalbuterol  0.63 mg Nebulization TID   levothyroxine  150 mcg Oral Once per day on Sun Mon Tue Thu Fri   [START ON 03/31/2023] levothyroxine  175 mcg Oral Once per day on Wed Sat    methylPREDNISolone (SOLU-MEDROL) injection  60 mg Intravenous Q12H   multivitamin with minerals  1 tablet Oral Daily   prednisoLONE acetate  1 drop Left Eye q AM   tamsulosin  0.8 mg Oral QHS   thiamine  100 mg Oral Daily   Or   thiamine  100 mg Intravenous Daily   Continuous:  levofloxacin (LEVAQUIN) IV 500 mg (03/29/23 1534)   [id number] **OR** acetaminophen, guaiFENesin-dextromethorphan, hydrALAZINE, HYDROcodone-acetaminophen, levalbuterol, LORazepam, ondansetron **OR** ondansetron (ZOFRAN) IV  Antibiotics: Anti-infectives (From admission, onward)    Start     Dose/Rate Route Frequency Ordered Stop   03/29/23 1600  levofloxacin (LEVAQUIN) IVPB 500 mg        500 mg 100 mL/hr over 60 Minutes Intravenous Every 24 hours 03/28/23 1421 04/02/23 1559   03/28/23 1345  levofloxacin (LEVAQUIN) IVPB 750 mg  Status:  Discontinued        750 mg 100 mL/hr over 90 Minutes Intravenous  Once 03/28/23 1339 03/29/23 0913       Objective:  Vital Signs  Vitals:   03/30/23 0112 03/30/23 0426 03/30/23 0841 03/30/23 0843  BP: (!) 127/59 (!) 144/69    Pulse: (!) 59 (!) 57    Resp: 18 18    Temp: 98 F (36.7 C) 97.7 F (36.5 C)    TempSrc: Oral Oral    SpO2: 93% 93% 93% 96%  Weight:      Height:        Intake/Output Summary (Last 24 hours) at 03/30/2023 0926 Last data filed at 03/30/2023 0900 Gross per 24 hour  Intake 1225.03 ml  Output 941 ml  Net 284.03 ml    Filed Weights   03/28/23 1015 03/28/23 1607  Weight: 75 kg 76.7 kg    General appearance: Awake alert.  In no distress Resp: Tachypneic but less so compared to yesterday.  Coarse breath sounds bilaterally.  Scattered wheezing.  Few crackles at the bases. Cardio: S1-S2 is normal regular.  No S3-S4.  No rubs murmurs or bruit GI: Abdomen is soft.  Nontender nondistended.  Bowel sounds are present normal.  No masses organomegaly Extremities: No edema.  Full range of motion of lower extremities. Neurologic: Alert and  oriented x3.  No focal neurological deficits.    Lab Results:  Data Reviewed: I have personally reviewed following labs and reports of the imaging studies  CBC: Recent Labs  Lab 03/28/23 1032 03/29/23 0305 03/30/23 0515  WBC 8.4 4.8 7.7  NEUTROABS 6.8  --   --   HGB 14.4 12.3* 12.4*  HCT 43.2 37.6* 38.2*  MCV 92.9 94.5 93.9  PLT 125* 113* 137*     Basic Metabolic Panel: Recent Labs  Lab 03/28/23 1032 03/28/23 1353 03/28/23 1818 03/29/23 0305 03/30/23 0515  NA 139 137 134* 140 138  K 4.0 3.8 4.0 4.4 4.7  CL 104 103 104 109 107  CO2 24 18* 20* 21* 25  GLUCOSE 146* 235* 309* 140* 186*  BUN 21 23 23  24* 30*  CREATININE 1.08 1.32* 1.11 0.86 0.92  CALCIUM 8.7* 8.4*  7.8* 7.9* 8.2*     GFR: Estimated Creatinine Clearance: 68.2 mL/min (by C-G formula based on SCr of 0.92 mg/dL).  Liver Function Tests: Recent Labs  Lab 03/28/23 1353 03/29/23 0305  AST 25 16  ALT 21 18  ALKPHOS 59 52  BILITOT 1.3* 0.6  PROT 6.9 6.4*  ALBUMIN 3.5 3.0*      Coagulation Profile: Recent Labs  Lab 03/28/23 1353  INR 1.3*      CBG: Recent Labs  Lab 03/29/23 0810 03/29/23 1145 03/29/23 1645 03/29/23 2106 03/30/23 0719  GLUCAP 158* 171* 251* 164* 178*      Recent Results (from the past 240 hour(s))  Resp panel by RT-PCR (RSV, Flu A&B, Covid) Anterior Nasal Swab     Status: None   Collection Time: 03/26/23 11:13 AM   Specimen: Anterior Nasal Swab  Result Value Ref Range Status   SARS Coronavirus 2 by RT PCR NEGATIVE NEGATIVE Final    Comment: (NOTE) SARS-CoV-2 target nucleic acids are NOT DETECTED.  The SARS-CoV-2 RNA is generally detectable in upper respiratory specimens during the acute phase of infection. The lowest concentration of SARS-CoV-2 viral copies this assay can detect is 138 copies/mL. A negative result does not preclude SARS-Cov-2 infection and should not be used as the sole basis for treatment or other patient management decisions. A negative  result may occur with  improper specimen collection/handling, submission of specimen other than nasopharyngeal swab, presence of viral mutation(s) within the areas targeted by this assay, and inadequate number of viral copies(<138 copies/mL). A negative result must be combined with clinical observations, patient history, and epidemiological information. The expected result is Negative.  Fact Sheet for Patients:  EntrepreneurPulse.com.au  Fact Sheet for Healthcare Providers:  IncredibleEmployment.be  This test is no t yet approved or cleared by the [place] FDA and  has been authorized for detection and/or diagnosis of SARS-CoV-2 by FDA under an Emergency Use Authorization (EUA). This EUA will remain  in effect (meaning this test can be used) for the duration of the COVID-19 declaration under Section 564(b)(1) of the Act, 21 U.S.C.section 360bbb-3(b)(1), unless the authorization is terminated  or revoked sooner.       Influenza A by PCR NEGATIVE NEGATIVE Final   Influenza B by PCR NEGATIVE NEGATIVE Final    Comment: (NOTE) The Xpert Xpress SARS-CoV-2/FLU/RSV plus assay is intended as an aid in the diagnosis of influenza from Nasopharyngeal swab specimens and should not be used as a sole basis for treatment. Nasal washings and aspirates are unacceptable for Xpert Xpress SARS-CoV-2/FLU/RSV testing.  Fact Sheet for Patients: EntrepreneurPulse.com.au  Fact Sheet for Healthcare Providers: IncredibleEmployment.be  This test is not yet approved or cleared by the [place] FDA and has been authorized for detection and/or diagnosis of SARS-CoV-2 by FDA under an Emergency Use Authorization (EUA). This EUA will remain in effect (meaning this test can be used) for the duration of the COVID-19 declaration under Section 564(b)(1) of the Act, 21 U.S.C. section 360bbb-3(b)(1), unless the authorization is  terminated or revoked.     Resp Syncytial Virus by PCR NEGATIVE NEGATIVE Final    Comment: (NOTE) Fact Sheet for Patients: EntrepreneurPulse.com.au  Fact Sheet for Healthcare Providers: IncredibleEmployment.be  This test is not yet approved or cleared by the [place] FDA and has been authorized for detection and/or diagnosis of SARS-CoV-2 by FDA under an Emergency Use Authorization (EUA). This EUA will remain in effect (meaning this test can be used) for the duration of the COVID-19  declaration under Section 564(b)(1) of the Act, 21 U.S.C. section 360bbb-3(b)(1), unless the authorization is terminated or revoked.  Performed at [facility], [address] [place], [place] [address]   Culture, blood (routine x 2)     Status: None (Preliminary result)   Collection Time: 03/28/23 10:40 AM   Specimen: Left Antecubital; Blood  Result Value Ref Range Status   Specimen Description LEFT ANTECUBITAL  Final   Special Requests   Final    BOTTLES DRAWN AEROBIC AND ANAEROBIC Blood Culture adequate volume   Culture   Final    NO GROWTH < 24 HOURS Performed at [facility], [address], [place],  [address]    Report Status PENDING  Incomplete  Resp panel by RT-PCR (RSV, Flu A&B, Covid) Anterior Nasal Swab     Status: None   Collection Time: 03/28/23 10:42 AM   Specimen: Anterior Nasal Swab  Result Value Ref Range Status   SARS Coronavirus 2 by RT PCR NEGATIVE NEGATIVE Final    Comment: (NOTE) SARS-CoV-2 target nucleic acids are NOT DETECTED.  The SARS-CoV-2 RNA is generally detectable in upper respiratory specimens during the acute phase of infection. The lowest concentration of SARS-CoV-2 viral copies this assay can detect is 138 copies/mL. A negative result does not preclude SARS-Cov-2 infection and should not be used as the sole basis for treatment or other patient management decisions. A negative result may occur with  improper  specimen collection/handling, submission of specimen other than nasopharyngeal swab, presence of viral mutation(s) within the areas targeted by this assay, and inadequate number of viral copies(<138 copies/mL). A negative result must be combined with clinical observations, patient history, and epidemiological information. The expected result is Negative.  Fact Sheet for Patients:  EntrepreneurPulse.com.au  Fact Sheet for Healthcare Providers:  IncredibleEmployment.be  This test is no t yet approved or cleared by the [place] FDA and  has been authorized for detection and/or diagnosis of SARS-CoV-2 by FDA under an Emergency Use Authorization (EUA). This EUA will remain  in effect (meaning this test can be used) for the duration of the COVID-19 declaration under Section 564(b)(1) of the Act, 21 U.S.C.section 360bbb-3(b)(1), unless the authorization is terminated  or revoked sooner.       Influenza A by PCR NEGATIVE NEGATIVE Final   Influenza B by PCR NEGATIVE NEGATIVE Final    Comment: (NOTE) The Xpert Xpress SARS-CoV-2/FLU/RSV plus assay is intended as an aid in the diagnosis of influenza from Nasopharyngeal swab specimens and should not be used as a sole basis for treatment. Nasal washings and aspirates are unacceptable for Xpert Xpress SARS-CoV-2/FLU/RSV testing.  Fact Sheet for Patients: EntrepreneurPulse.com.au  Fact Sheet for Healthcare Providers: IncredibleEmployment.be  This test is not yet approved or cleared by the [place] FDA and has been authorized for detection and/or diagnosis of SARS-CoV-2 by FDA under an Emergency Use Authorization (EUA). This EUA will remain in effect (meaning this test can be used) for the duration of the COVID-19 declaration under Section 564(b)(1) of the Act, 21 U.S.C. section 360bbb-3(b)(1), unless the authorization is terminated or revoked.     Resp  Syncytial Virus by PCR NEGATIVE NEGATIVE Final    Comment: (NOTE) Fact Sheet for Patients: EntrepreneurPulse.com.au  Fact Sheet for Healthcare Providers: IncredibleEmployment.be  This test is not yet approved or cleared by the [place] FDA and has been authorized for detection and/or diagnosis of SARS-CoV-2 by FDA under an Emergency Use Authorization (EUA). This EUA will remain in effect (meaning  this test can be used) for the duration of the COVID-19 declaration under Section 564(b)(1) of the Act, 21 U.S.C. section 360bbb-3(b)(1), unless the authorization is terminated or revoked.  Performed at [facility], [address], [place], [place] [address]   Culture, blood (routine x 2)     Status: None (Preliminary result)   Collection Time: 03/28/23 11:13 AM   Specimen: BLOOD  Result Value Ref Range Status   Specimen Description BLOOD RIGHT ANTECUBITAL  Final   Special Requests   Final    BOTTLES DRAWN AEROBIC AND ANAEROBIC Blood Culture adequate volume   Culture   Final    NO GROWTH < 12 HOURS Performed at [facility], [address], [place], [place] [address]    Report Status PENDING  Incomplete  MRSA Next Gen by PCR, Nasal     Status: None   Collection Time: 03/28/23  4:29 PM   Specimen: Nasal Mucosa; Nasal Swab  Result Value Ref Range Status   MRSA by PCR Next Gen NOT DETECTED NOT DETECTED Final    Comment: (NOTE) The GeneXpert MRSA Assay (FDA approved for NASAL specimens only), is one component of a comprehensive MRSA colonization surveillance program. It is not intended to diagnose MRSA infection nor to guide or monitor treatment for MRSA infections. Test performance is not FDA approved in patients less than 90 years old. Performed at [facility], [address], [place], [place] [address]   Respiratory (~20 pathogens) panel by PCR     Status: Abnormal   Collection Time: 03/29/23  8:10 AM   Specimen: Nasopharyngeal Swab;  Respiratory  Result Value Ref Range Status   Adenovirus NOT DETECTED NOT DETECTED Final   Coronavirus 229E NOT DETECTED NOT DETECTED Final    Comment: (NOTE) The Coronavirus on the Respiratory Panel, DOES NOT test for the novel  Coronavirus (2019 nCoV)    Coronavirus HKU1 NOT DETECTED NOT DETECTED Final   Coronavirus NL63 NOT DETECTED NOT DETECTED Final   Coronavirus OC43 NOT DETECTED NOT DETECTED Final   Metapneumovirus NOT DETECTED NOT DETECTED Final   Rhinovirus / Enterovirus DETECTED (A) NOT DETECTED Final   Influenza A NOT DETECTED NOT DETECTED Final   Influenza B NOT DETECTED NOT DETECTED Final   Parainfluenza Virus 1 NOT DETECTED NOT DETECTED Final   Parainfluenza Virus 2 NOT DETECTED NOT DETECTED Final   Parainfluenza Virus 3 NOT DETECTED NOT DETECTED Final   Parainfluenza Virus 4 NOT DETECTED NOT DETECTED Final   Respiratory Syncytial Virus NOT DETECTED NOT DETECTED Final   Bordetella pertussis NOT DETECTED NOT DETECTED Final   Bordetella Parapertussis NOT DETECTED NOT DETECTED Final   Chlamydophila pneumoniae NOT DETECTED NOT DETECTED Final   Mycoplasma pneumoniae NOT DETECTED NOT DETECTED Final    Comment: Performed at [facility], [address] [place], [place] [address]      Radiology Studies: CT Angio Chest Pulmonary Embolism (PE) W or WO Contrast  Result Date: 03/28/2023 CLINICAL DATA:  PE suspected, worsening shortness of breath EXAM: CT ANGIOGRAPHY CHEST WITH CONTRAST TECHNIQUE: Multidetector CT imaging of the chest was performed using the standard protocol during bolus administration of intravenous contrast. Multiplanar CT image reconstructions and MIPs were obtained to evaluate the vascular anatomy. RADIATION DOSE REDUCTION: This exam was performed according to the departmental dose-optimization program which includes automated exposure control, adjustment of the mA and/or kV according to patient size and/or use of iterative reconstruction technique.  CONTRAST:  67mL OMNIPAQUE IOHEXOL 350 MG/ML SOLN COMPARISON:  03/30/2022  FINDINGS: Cardiovascular: Examination for pulmonary embolism is somewhat limited by breath motion artifact, particularly in the lung bases. Within this limitation, negative examination for pulmonary embolism through the proximal segmental pulmonary arterial level. Normal heart size. Three-vessel coronary artery calcifications. No pericardial effusion. Aortic atherosclerosis. Mediastinum/Nodes: No enlarged mediastinal, hilar, or axillary lymph nodes. Thyroid gland, trachea, and esophagus demonstrate no significant findings. Lungs/Pleura: Heterogeneous consolidative airspace opacity throughout the bilateral lung bases, worst in the dependent left lung base (series 6, image 117). Probable underlying scarring and volume loss of the right lung base (series 6, image 84). No pleural effusion or pneumothorax. Upper Abdomen: No acute abnormality. Musculoskeletal: No chest wall abnormality. No acute osseous findings. Review of the MIP images confirms the above findings. IMPRESSION: 1. Examination for pulmonary embolism is somewhat limited by breath motion artifact, particularly in the lung bases. Within this limitation, negative examination for pulmonary embolism through the proximal segmental pulmonary arterial level. 2. Heterogeneous consolidative airspace opacity throughout the bilateral lung bases, worst in the dependent left lung base. Findings are consistent with multifocal infection or aspiration. 3. Probable underlying scarring and volume loss of the right lung base. 4. Coronary artery disease. Aortic Atherosclerosis (ICD10-I70.0). Electronically Signed   By: Delanna Ahmadi M.D.   On: 03/28/2023 14:37   DG Chest Port 1 View  Result Date: 03/28/2023 CLINICAL DATA:  77 year old male with history of shortness of breath. EXAM: PORTABLE CHEST 1 VIEW COMPARISON:  Chest x-ray 05/27/2022. FINDINGS: Lung volumes are low. Widespread interstitial  prominence and peribronchial cuffing. No consolidative airspace disease. No pleural effusions. No pneumothorax. No pulmonary nodule or mass noted. Pulmonary vasculature and the cardiomediastinal silhouette are within normal limits. Atherosclerotic calcifications in the thoracic aorta. IMPRESSION: 1. Diffuse interstitial prominence and peribronchial cuffing, which may suggest an acute bronchitis. 2. Aortic atherosclerosis. Electronically Signed   By: Vinnie Langton M.D.   On: 03/28/2023 10:52       LOS: 2 days   [facility] Pager on [email or web address]  03/30/2023, 9:26 AM

## 2023-03-31 DIAGNOSIS — J9601 Acute respiratory failure with hypoxia: Secondary | ICD-10-CM | POA: Diagnosis not present

## 2023-03-31 LAB — BASIC METABOLIC PANEL
Anion gap: 7 (ref 5–15)
BUN: 34 mg/dL — ABNORMAL HIGH (ref 8–23)
CO2: 27 mmol/L (ref 22–32)
Calcium: 8.3 mg/dL — ABNORMAL LOW (ref 8.9–10.3)
Chloride: 103 mmol/L (ref 98–111)
Creatinine, Ser: 0.93 mg/dL (ref 0.61–1.24)
GFR, Estimated: >60 mL/min (ref >60)
Glucose, Bld: 208 mg/dL — ABNORMAL HIGH (ref 70–99)
Potassium: 4.4 mmol/L (ref 3.5–5.1)
Sodium: 137 mmol/L (ref 135–145)

## 2023-03-31 LAB — GLUCOSE, CAPILLARY
Glucose-Capillary: 219 mg/dL — ABNORMAL HIGH (ref 70–99)
Glucose-Capillary: 261 mg/dL — ABNORMAL HIGH (ref 70–99)
Glucose-Capillary: 241 mg/dL — ABNORMAL HIGH (ref 70–99)
Glucose-Capillary: 129 mg/dL — ABNORMAL HIGH (ref 70–99)

## 2023-03-31 MED ORDER — LEVOFLOXACIN 500 MG PO TABS
500.0000 mg | ORAL_TABLET | ORAL | Status: DC
  Administered 2023-03-31: 500 mg via ORAL
  Filled 2023-03-31: qty 1

## 2023-03-31 MED ORDER — INSULIN ASPART 100 UNIT/ML IJ SOLN
3.0000 [IU] | Freq: Three times a day (TID) | INTRAMUSCULAR | Status: DC
  Administered 2023-03-31 – 2023-04-01 (×4): 3 [IU] via SUBCUTANEOUS

## 2023-03-31 MED ORDER — METHYLPREDNISOLONE SODIUM SUCC 40 MG IJ SOLR
40.0000 mg | Freq: Two times a day (BID) | INTRAMUSCULAR | Status: DC
  Administered 2023-03-31 – 2023-04-01 (×2): 40 mg via INTRAVENOUS
  Filled 2023-03-31 (×2): qty 1

## 2023-03-31 MED ORDER — BUDESONIDE 0.5 MG/2ML IN SUSP
0.5000 mg | Freq: Two times a day (BID) | RESPIRATORY_TRACT | Status: DC
  Administered 2023-03-31 – 2023-04-01 (×2): 0.5 mg via RESPIRATORY_TRACT
  Filled 2023-03-31 (×2): qty 2

## 2023-03-31 NOTE — Progress Notes (Signed)
Mobility Specialist Progress Note:    03/31/23 1230  Mobility  Activity Ambulated with assistance in hallway  Level of Assistance Standby assist, set-up cues, supervision of patient - no hands on  Assistive Device None  Distance Ambulated (ft) 240 ft  Activity Response Tolerated well  Mobility Referral Yes  $Mobility charge 1 Mobility   Pt agreeable to mobility session. Tolerated well, SpO2 89% on 1L during ambulation. Took 1 seated rest break halfway through session d/t visiting wife (Room 341), recovered, SpO2 93% on 1L. Returned pt to room, all needs met, call bell in reach.   Royetta Crochet Mobility Specialist Please contact via Solicitor or  Rehab office at [phone]

## 2023-03-31 NOTE — Care Management Important Message (Signed)
Important Message  Patient Details  Name: Christopher Randolph MRN: [id number] Date of Birth: 13-May-1946   Medicare Important Message Given:  Yes     Tommy Medal 03/31/2023, 11:06 AM

## 2023-03-31 NOTE — Plan of Care (Signed)
  Problem: Coping: Goal: Ability to adjust to condition or change in health will improve Outcome: Progressing   

## 2023-03-31 NOTE — Progress Notes (Signed)
PROGRESS NOTE  Christopher Randolph  [id number] DOB: 07-23-1946 DOA: 03/28/2023 PCP: Curlene Labrum, MD   Brief Narrative: Patient is a 77 male with history of chronic diastolic CHF, carotid artery disease, diabetes type 2, hypertension, hypothyroidism who presented with cough, congestion, shortness of breath.  Noted to be hypoxic in presentation.  Respiratory viral panel came out to be positive for rhinovirus.  Hospital course remarkable for persistent hypoxia requiring oxygen.  Currently on 2 L of oxygen per minute.  Currently on IV steroids, which we will continue.  We are trying to wean the oxygen.  If patient remains oxygen by tomorrow, will try to qualify for oxygen for home  Assessment & Plan:  Principal Problem:   Acute respiratory failure with hypoxia Active Problems:   PAD (peripheral artery disease) (HCC)   CAD (coronary artery disease)   Hypertension   Hypothyroidism   DMII (diabetes mellitus, type 2)   COPD with chronic bronchitis and emphysema  Acute hypoxic respiratory failure: Presented with cough, congestion, shortness of breath.  Rhinovirus came out to be positive.  CT angiogram negative for PE.  Initially patient was put on 5 L of oxygen.  Currently on 2 L of oxygen.  Continues to have cough.  Continue steroids, nebulizer treatment.  Also on Levaquin.  Chest x-ray follow-up done on 4/2 showed  chronic right basilar atelectasis without progressive airspace disease Patient is not on oxygen  at home.  Will try to wean and monitor on room air. If patient remains oxygen by tomorrow, will try to qualify for oxygen for home   Lactic acidosis: Likely acid elevated at 4 on presentation.  Given fluid boluses.  Resolved  COPD exacerbation: Past smoker.  Presented with wheezing, exacerbated by rhinovirus.  Continue steroids, antibiotics, bronchodilators.Follows with pulmonology as an outpatient  Chronic diastolic CHF: Currently looks euvolemic.  Hypothyroidism: Continue home  medication  Hypertension: Continue lisinopril  Diabetes type 2: On Farxiga at home.  Currently on sliding scale  insulin.  Continue to monitor blood sugars.  Diabetes coordinator  following.  History of alcohol use: Drinks 1-2 beers daily.  No withdrawal symptoms.  On CIWA.  Continue thiamine, folic acid  Mild thrombocytopenia: Stable  Normocytic anemia: Current hemoglobin is stable.  No signs of bleeding       DVT prophylaxis:enoxaparin (LOVENOX) injection 40 mg Start: 03/28/23 2200     Code Status: Full Code  Family Communication: None at the bedside  Patient status:Inpatient  Patient is from :Home  Anticipated discharge NE:[id number]  Estimated DC date:tomorrow   Consultants: None  Procedures:None  Antimicrobials:  Anti-infectives (From admission, onward)    Start     Dose/Rate Route Frequency Ordered Stop   03/29/23 1600  levofloxacin (LEVAQUIN) IVPB 500 mg        500 mg 100 mL/hr over 60 Minutes Intravenous Every 24 hours 03/28/23 1421 04/02/23 1559   03/28/23 1345  levofloxacin (LEVAQUIN) IVPB 750 mg  Status:  Discontinued        750 mg 100 mL/hr over 90 Minutes Intravenous  Once 03/28/23 1339 03/29/23 0913       Subjective: Patient seen and examined the bedside today.  He was on 2 L of oxygen this morning.  He feels fine when he sits but when he lies down, he feels short of breath.  He thinks that he is improving.  Denies any worsening shortness of breath.  His wife was admitted for the same symptoms and is  being discharged today.  We discussed about trying to wean the oxygen today.  Objective: Vitals:   03/30/23 2300 03/31/23 0358 03/31/23 0849 03/31/23 0855  BP: (!) 143/65 (!) 154/60    Pulse: 61 61    Resp: (!) 22 20    Temp: (!) 97.4 F (36.3 C) 98.2 F (36.8 C)    TempSrc: Oral Oral    SpO2: 92% 94% 92% 95%  Weight:      Height:        Intake/Output Summary (Last 24 hours) at 03/31/2023 0930 Last data filed at 03/30/2023 1700 Gross per 24 hour   Intake 480 ml  Output --  Net 480 ml   Filed Weights   03/28/23 1015 03/28/23 1607  Weight: 75 kg 76.7 kg    Examination:  General exam: Overall comfortable, not in distress HEENT: PERRL Respiratory system: Diminished sounds but today, mild expiratory wheezing, some crackles on the right  base Cardiovascular system: S1 & S2 heard, RRR.  Gastrointestinal system: Abdomen is nondistended, soft and nontender. Central nervous system: Alert and oriented Extremities: No edema, no clubbing ,no cyanosis Skin: No rashes, no ulcers,no icterus     Data Reviewed: I have personally reviewed following labs and imaging studies  CBC: Recent Labs  Lab 03/28/23 1032 03/29/23 0305 03/30/23 0515  WBC 8.4 4.8 7.7  NEUTROABS 6.8  --   --   HGB 14.4 12.3* 12.4*  HCT 43.2 37.6* 38.2*  MCV 92.9 94.5 93.9  PLT 125* 113* 0000000*   Basic Metabolic Panel: Recent Labs  Lab 03/28/23 1353 03/28/23 1818 03/29/23 0305 03/30/23 0515 03/31/23 0401  NA 137 134* 140 138 137  K 3.8 4.0 4.4 4.7 4.4  CL 103 104 109 107 103  CO2 18* 20* 21* 25 27  GLUCOSE 235* 309* 140* 186* 208*  BUN 23 23 24* 30* 34*  CREATININE 1.32* 1.11 0.86 0.92 0.93  CALCIUM 8.4* 7.8* 7.9* 8.2* 8.3*     Recent Results (from the past 240 hour(s))  Resp panel by RT-PCR (RSV, Flu A&B, Covid) Anterior Nasal Swab     Status: None   Collection Time: 03/26/23 11:13 AM   Specimen: Anterior Nasal Swab  Result Value Ref Range Status   SARS Coronavirus 2 by RT PCR NEGATIVE NEGATIVE Final    Comment: (NOTE) SARS-CoV-2 target nucleic acids are NOT DETECTED.  The SARS-CoV-2 RNA is generally detectable in upper respiratory specimens during the acute phase of infection. The lowest concentration of SARS-CoV-2 viral copies this assay can detect is 138 copies/mL. A negative result does not preclude SARS-Cov-2 infection and should not be used as the sole basis for treatment or other patient management decisions. A negative result may  occur with  improper specimen collection/handling, submission of specimen other than nasopharyngeal swab, presence of viral mutation(s) within the areas targeted by this assay, and inadequate number of viral copies(<138 copies/mL). A negative result must be combined with clinical observations, patient history, and epidemiological information. The expected result is Negative.  Fact Sheet for Patients:  EntrepreneurPulse.com.au  Fact Sheet for Healthcare Providers:  IncredibleEmployment.be  This test is no t yet approved or cleared by the [place] FDA and  has been authorized for detection and/or diagnosis of SARS-CoV-2 by FDA under an Emergency Use Authorization (EUA). This EUA will remain  in effect (meaning this test can be used) for the duration of the COVID-19 declaration under Section 564(b)(1) of the Act, 21 U.S.C.section 360bbb-3(b)(1), unless the authorization is terminated  or revoked sooner.  Influenza A by PCR NEGATIVE NEGATIVE Final   Influenza B by PCR NEGATIVE NEGATIVE Final    Comment: (NOTE) The Xpert Xpress SARS-CoV-2/FLU/RSV plus assay is intended as an aid in the diagnosis of influenza from Nasopharyngeal swab specimens and should not be used as a sole basis for treatment. Nasal washings and aspirates are unacceptable for Xpert Xpress SARS-CoV-2/FLU/RSV testing.  Fact Sheet for Patients: EntrepreneurPulse.com.au  Fact Sheet for Healthcare Providers: IncredibleEmployment.be  This test is not yet approved or cleared by the [place] FDA and has been authorized for detection and/or diagnosis of SARS-CoV-2 by FDA under an Emergency Use Authorization (EUA). This EUA will remain in effect (meaning this test can be used) for the duration of the COVID-19 declaration under Section 564(b)(1) of the Act, 21 U.S.C. section 360bbb-3(b)(1), unless the authorization is terminated  or revoked.     Resp Syncytial Virus by PCR NEGATIVE NEGATIVE Final    Comment: (NOTE) Fact Sheet for Patients: EntrepreneurPulse.com.au  Fact Sheet for Healthcare Providers: IncredibleEmployment.be  This test is not yet approved or cleared by the [place] FDA and has been authorized for detection and/or diagnosis of SARS-CoV-2 by FDA under an Emergency Use Authorization (EUA). This EUA will remain in effect (meaning this test can be used) for the duration of the COVID-19 declaration under Section 564(b)(1) of the Act, 21 U.S.C. section 360bbb-3(b)(1), unless the authorization is terminated or revoked.  Performed at [facility], [address] [place], [place] [address]   Culture, blood (routine x 2)     Status: None (Preliminary result)   Collection Time: 03/28/23 10:40 AM   Specimen: Left Antecubital; Blood  Result Value Ref Range Status   Specimen Description   Final    LEFT ANTECUBITAL Performed at [facility] [address] [place], [place] [address]    Special Requests   Final    BOTTLES DRAWN AEROBIC AND ANAEROBIC Blood Culture adequate volume Performed at [facility], [address] [place], [place] [address]    Culture   Final    NO GROWTH 3 DAYS Performed at [facility], [place] [address] [place], [place] [address]    Report Status PENDING  Incomplete  Resp panel by RT-PCR (RSV, Flu A&B, Covid) Anterior Nasal Swab     Status: None   Collection Time: 03/28/23 10:42 AM   Specimen: Anterior Nasal Swab  Result Value Ref Range Status   SARS Coronavirus 2 by RT PCR NEGATIVE NEGATIVE Final    Comment: (NOTE) SARS-CoV-2 target nucleic acids are NOT DETECTED.  The SARS-CoV-2 RNA is generally detectable in upper respiratory specimens during the acute phase of infection. The lowest concentration of SARS-CoV-2 viral copies this assay can detect is 138 copies/mL. A negative result does not preclude SARS-Cov-2 infection  and should not be used as the sole basis for treatment or other patient management decisions. A negative result may occur with  improper specimen collection/handling, submission of specimen other than nasopharyngeal swab, presence of viral mutation(s) within the areas targeted by this assay, and inadequate number of viral copies(<138 copies/mL). A negative result must be combined with clinical observations, patient history, and epidemiological information. The expected result is Negative.  Fact Sheet for Patients:  EntrepreneurPulse.com.au  Fact Sheet for Healthcare Providers:  IncredibleEmployment.be  This test is no t yet approved or cleared by the [place] FDA and  has been authorized for detection and/or diagnosis of SARS-CoV-2 by FDA under an Emergency Use Authorization (EUA). This EUA will remain  in effect (meaning this test can be used) for the duration of the COVID-19 declaration under Section 564(b)(1) of the Act, 21 U.S.C.section 360bbb-3(b)(1), unless the authorization is terminated  or revoked sooner.       Influenza A by PCR NEGATIVE NEGATIVE Final   Influenza B by PCR NEGATIVE NEGATIVE Final    Comment: (NOTE) The Xpert Xpress SARS-CoV-2/FLU/RSV plus assay is intended as an aid in the diagnosis of influenza from Nasopharyngeal swab specimens and should not be used as a sole basis for treatment. Nasal washings and aspirates are unacceptable for Xpert Xpress SARS-CoV-2/FLU/RSV testing.  Fact Sheet for Patients: EntrepreneurPulse.com.au  Fact Sheet for Healthcare Providers: IncredibleEmployment.be  This test is not yet approved or cleared by the [place] FDA and has been authorized for detection and/or diagnosis of SARS-CoV-2 by FDA under an Emergency Use Authorization (EUA). This EUA will remain in effect (meaning this test can be used) for the duration of the COVID-19 declaration  under Section 564(b)(1) of the Act, 21 U.S.C. section 360bbb-3(b)(1), unless the authorization is terminated or revoked.     Resp Syncytial Virus by PCR NEGATIVE NEGATIVE Final    Comment: (NOTE) Fact Sheet for Patients: EntrepreneurPulse.com.au  Fact Sheet for Healthcare Providers: IncredibleEmployment.be  This test is not yet approved or cleared by the [place] FDA and has been authorized for detection and/or diagnosis of SARS-CoV-2 by FDA under an Emergency Use Authorization (EUA). This EUA will remain in effect (meaning this test can be used) for the duration of the COVID-19 declaration under Section 564(b)(1) of the Act, 21 U.S.C. section 360bbb-3(b)(1), unless the authorization is terminated or revoked.  Performed at [facility], [address] [place], [place] [address]   Culture, blood (routine x 2)     Status: None (Preliminary result)   Collection Time: 03/28/23 11:13 AM   Specimen: BLOOD  Result Value Ref Range Status   Specimen Description   Final    BLOOD RIGHT ANTECUBITAL Performed at [facility], [address] [place], [place] [address]    Special Requests   Final    BOTTLES DRAWN AEROBIC AND ANAEROBIC Blood Culture adequate volume Performed at [facility], [address], [place], [place] [address]    Culture   Final    NO GROWTH 3 DAYS Performed at [facility] [place] [address] [place], [place] [address]    Report Status PENDING  Incomplete  MRSA Next Gen by PCR, Nasal     Status: None   Collection Time: 03/28/23  4:29 PM   Specimen: Nasal Mucosa; Nasal Swab  Result Value Ref Range Status   MRSA by PCR Next Gen NOT DETECTED NOT DETECTED Final    Comment: (NOTE) The GeneXpert MRSA Assay (FDA approved for NASAL specimens only), is one component of a comprehensive MRSA colonization surveillance program. It is not intended to diagnose MRSA infection nor to guide or monitor treatment for MRSA  infections. Test performance is not FDA approved in patients less than 65 years old. Performed at [facility], [address] [place],  [address]   Respiratory (~20 pathogens) panel by PCR     Status: Abnormal   Collection Time: 03/29/23  8:10 AM   Specimen: Nasopharyngeal Swab; Respiratory  Result Value Ref Range Status   Adenovirus NOT DETECTED NOT DETECTED Final   Coronavirus 229E NOT DETECTED NOT DETECTED Final    Comment: (NOTE) The Coronavirus on the Respiratory Panel, DOES NOT test for the novel  Coronavirus (2019 nCoV)  Coronavirus HKU1 NOT DETECTED NOT DETECTED Final   Coronavirus NL63 NOT DETECTED NOT DETECTED Final   Coronavirus OC43 NOT DETECTED NOT DETECTED Final   Metapneumovirus NOT DETECTED NOT DETECTED Final   Rhinovirus / Enterovirus DETECTED (A) NOT DETECTED Final   Influenza A NOT DETECTED NOT DETECTED Final   Influenza B NOT DETECTED NOT DETECTED Final   Parainfluenza Virus 1 NOT DETECTED NOT DETECTED Final   Parainfluenza Virus 2 NOT DETECTED NOT DETECTED Final   Parainfluenza Virus 3 NOT DETECTED NOT DETECTED Final   Parainfluenza Virus 4 NOT DETECTED NOT DETECTED Final   Respiratory Syncytial Virus NOT DETECTED NOT DETECTED Final   Bordetella pertussis NOT DETECTED NOT DETECTED Final   Bordetella Parapertussis NOT DETECTED NOT DETECTED Final   Chlamydophila pneumoniae NOT DETECTED NOT DETECTED Final   Mycoplasma pneumoniae NOT DETECTED NOT DETECTED Final    Comment: Performed at [facility], [place] [address], [place], [place] [address]     Radiology Studies: DG CHEST PORT 1 VIEW  Result Date: 03/30/2023 CLINICAL DATA:  Cough and shortness of breath. EXAM: PORTABLE CHEST 1 VIEW COMPARISON:  Chest CT 03/28/2023 and abdominal CT 12/26/2022. Radiographs 03/28/2023 and 05/27/2022. FINDINGS: 0902 hours for the heart size and mediastinal contours are stable. There is aortic atherosclerosis. Chronic asymmetric elevation of the right hemidiaphragm with  associated right basilar atelectasis, at least in part chronic. The patchy superimposed basilar opacities are unchanged from the recent prior radiographs and CT. No progressive airspace disease, pleural effusion or pneumothorax. The bones appear unchanged. IMPRESSION: No significant changes from recent prior studies. Chronic right basilar atelectasis without progressive airspace disease. Electronically Signed   By: Richardean Sale M.D.   On: 03/30/2023 09:32    Scheduled Meds:  aspirin EC  81 mg Oral Daily   budesonide (PULMICORT) nebulizer solution  0.25 mg Nebulization BID   clopidogrel  75 mg Oral Daily   enoxaparin (LOVENOX) injection  40 mg Subcutaneous [id number]   folic acid  1 mg Oral Daily   insulin aspart  0-20 Units Subcutaneous TID WC   insulin aspart  0-5 Units Subcutaneous QHS   ipratropium  0.5 mg Nebulization TID   ketorolac  1 drop Left Eye QID   latanoprost  1 drop Left Eye QHS   levalbuterol  0.63 mg Nebulization TID   levothyroxine  150 mcg Oral Once per day on Sun Mon Tue Thu Fri   levothyroxine  175 mcg Oral Once per day on Wed Sat   lisinopril  20 mg Oral Daily   methylPREDNISolone (SOLU-MEDROL) injection  60 mg Intravenous Q12H   multivitamin with minerals  1 tablet Oral Daily   prednisoLONE acetate  1 drop Left Eye q AM   tamsulosin  0.8 mg Oral QHS   thiamine  100 mg Oral Daily   Or   thiamine  100 mg Intravenous Daily   Continuous Infusions:  levofloxacin (LEVAQUIN) IV 500 mg (03/30/23 1636)     LOS: 3 days   Shelly Coss, MD [facility] P4/02/2023, 9:30 AM

## 2023-03-31 NOTE — Inpatient Diabetes Management (Signed)
Inpatient Diabetes Program Recommendations  AACE/ADA: New Consensus Statement on Inpatient Glycemic Control   Target Ranges:  Prepandial:   less than 140 mg/dL      Peak postprandial:   less than 180 mg/dL (1-2 hours)      Critically ill patients:  140 - 180 mg/dL    Latest Reference Range & Units 03/31/23 04:01  Glucose 70 - 99 mg/dL 208 (H)    Latest Reference Range & Units 03/30/23 07:19 03/30/23 11:29 03/30/23 16:23 03/30/23 20:21  Glucose-Capillary 70 - 99 mg/dL 178 (H) 211 (H) 211 (H) 160 (H)   Review of Glycemic Control  Diabetes history: DM2 Outpatient Diabetes medications: Farxiga 10 mg daily Current orders for Inpatient glycemic control: Novolog 0-20 units TID with meals, Novolog 0-5 units QHS; Solumedrol 60 mg Q12H  Inpatient Diabetes Program Recommendations:    Insulin: If steroids are continued, please consider ordering Novolog 3 units TID with meals for meal coverage if patient eats at least 50% of meals.  Thanks, Barnie Alderman, RN, MSN, [place] Diabetes Coordinator Inpatient Diabetes Program [phone] (Team Pager from 8am to [place])

## 2023-04-01 DIAGNOSIS — J9601 Acute respiratory failure with hypoxia: Secondary | ICD-10-CM | POA: Diagnosis not present

## 2023-04-01 LAB — GLUCOSE, CAPILLARY
Glucose-Capillary: 174 mg/dL — ABNORMAL HIGH (ref 70–99)
Glucose-Capillary: 235 mg/dL — ABNORMAL HIGH (ref 70–99)

## 2023-04-01 MED ORDER — ALBUTEROL SULFATE HFA 108 (90 BASE) MCG/ACT IN AERS: 2.0000 | INHALATION_SPRAY | Freq: Four times a day (QID) | RESPIRATORY_TRACT | 2 refills | Status: DC | PRN

## 2023-04-01 MED ORDER — GUAIFENESIN-DM 100-10 MG/5ML PO SYRP: 5.0000 mL | ORAL_SOLUTION | ORAL | 0 refills | Status: DC | PRN

## 2023-04-01 MED ORDER — IPRATROPIUM-ALBUTEROL 0.5-2.5 (3) MG/3ML IN SOLN: 3.0000 mL | Freq: Four times a day (QID) | RESPIRATORY_TRACT | 1 refills | Status: DC | PRN

## 2023-04-01 MED ORDER — PREDNISONE 10 MG PO TABS: 10.0000 mg | ORAL_TABLET | Freq: Every day | ORAL | 0 refills | Status: DC

## 2023-04-01 NOTE — TOC Transition Note (Signed)
Transition of Care [facility]) - CM/SW Discharge Note   Patient Details  Name: Christopher Randolph MRN: [id number] Date of Birth: Apr 29, 1946  [facility]) CM/SW Contact:  Shade Flood, LCSW Phone Number: 04/01/2023, 11:32 AM   Clinical Narrative:     Pt stable for dc home today with Home O2 and neb machine per MD. Damaris Schooner with pt to review CMS DME provider options. Referred to [facility] as requested. Pt can dc home once portable O2 delivered to bedside.  Updated RN. No other TOC needs for dc.  Final next level of care: Home/Self Care Barriers to Discharge: Barriers Resolved   Patient Goals and CMS Choice   Choice offered to / list presented to : Patient  Discharge Placement                         Discharge Plan and Services Additional resources added to the After Visit Summary for   In-house Referral: Clinical Social Work              DME Arranged: [facility] D[facility] Agency: [facility] Date DME Agency Contacted: 04/01/23   Representative spoke with at DME Agency: Erasmo Downer            Social Determinants of Health ([place]) Interventions SDOH Screenings   Food Insecurity: No Food Insecurity (03/28/2023)  Housing: Low Risk  (03/28/2023)  Transportation Needs: No Transportation Needs (03/28/2023)  Utilities: Not At Risk (03/28/2023)  Tobacco Use: Medium Risk (03/28/2023)     Readmission Risk Interventions    03/30/2023   11:48 AM 03/26/2022   10:24 AM  Readmission Risk Prevention Plan  Post Dischage Appt  Complete  Medication Screening  Complete  Transportation Screening Complete Complete  HRI or [place] Complete   Social Work Consult for [place] Planning/Counseling Complete   Palliative Care Screening Not Applicable   Medication Review Press photographer) Complete

## 2023-04-01 NOTE — Progress Notes (Addendum)
SATURATION QUALIFICATIONS: (This note is used to comply with regulatory documentation for home oxygen)    Patient Saturations on Room Air at Rest = 84%  Patient Saturations on Room Air while Ambulating = 82%  Patient Saturations on 3 Liters of oxygen while Ambulating = 92%  Please briefly explain why patient needs home oxygen:

## 2023-04-01 NOTE — Discharge Summary (Signed)
Physician Discharge Summary  Christopher Randolph [id number] DOB: April 23, 1946 DOA: 03/28/2023  PCP: Curlene Labrum, MD  Admit date: 03/28/2023 Discharge date: 04/01/2023  Admitted From: Home Disposition:  Home  Discharge Condition:Stable CODE STATUS:FULL Diet recommendation: Heart Healthy  Brief/Interim Summary: Patient is a 77 male with history of chronic diastolic CHF, carotid artery disease, diabetes type 2, hypertension, hypothyroidism who presented with cough, congestion, shortness of breath.  Noted to be hypoxic in presentation.  Respiratory viral panel came out to be positive for rhinovirus.  Hospital course remarkable for persistent hypoxia requiring oxygen.  He qualified for home oxygen.  Medically stable for discharge today on tapering dose of prednisone and home oxygen.  Following problems were addressed during the hospitalization:  Acute hypoxic respiratory failure: Presented with cough, congestion, shortness of breath.  Rhinovirus came out to be positive.  CT angiogram negative for PE.  Initially patient was put on 5 L of oxygen.  Currently on 3 L of oxygen.  Chest x-ray follow-up done on 4/2 showed  chronic right basilar atelectasis without progressive airspace disease Patient is not on oxygen  at home.  He qualified for home oxygen.  Will discharge him on tapering prednisone dose   Lactic acidosis: Likely acid elevated at 4 on presentation.  Given fluid boluses.  Resolved   COPD exacerbation: Past smoker.  Presented with wheezing, exacerbated by rhinovirus.  Continue steroids,  bronchodilators.Follows with pulmonology as an outpatient.  Will recommend to follow-up with pulmonology as an outpatient.Ambulatory referral sent   Chronic diastolic CHF: Currently looks euvolemic.   Hypothyroidism: Continue home medication   Hypertension: Continue lisinopril   Diabetes type 2: On Farxiga at home.    History of alcohol use: Drinks 1-2 beers daily.  No withdrawal symptoms.    Mild thrombocytopenia: Stable   Normocytic anemia: Current hemoglobin is stable.  No signs of bleeding     Discharge Diagnoses:  Principal Problem:   Acute respiratory failure with hypoxia Active Problems:   PAD (peripheral artery disease) (HCC)   CAD (coronary artery disease)   Hypertension   Hypothyroidism   DMII (diabetes mellitus, type 2)   COPD with chronic bronchitis and emphysema    Discharge Instructions  Discharge Instructions     Ambulatory referral to Pulmonology   Complete by: As directed    Reason for referral: Asthma/COPD Comment - copd   Diet - low sodium heart healthy   Complete by: As directed    Discharge instructions   Complete by: As directed    1)Please take prescribed medications as instructed 2)Follow up with your PCP in a week 3)Follow up with pulmonology as an outpatient for pulmonary function testy   For home use only DME Nebulizer machine   Complete by: As directed    Patient needs a nebulizer to treat with the following condition: COPD exacerbation   Length of Need: Lifetime   Increase activity slowly   Complete by: As directed       Allergies as of 04/01/2023       Reactions   Erythromycin Swelling   Lip swelling   Shellfish Allergy Rash, Other (See Comments)   Gout flares   Nitrofuran Derivatives Swelling   Nitrostat [nitroglycerin] Other (See Comments)   Severe hypotension   Zithromax [azithromycin] Swelling        Medication List     TAKE these medications    acetaminophen 500 MG tablet Commonly known as: TYLENOL Take 1,000 mg by mouth daily as needed for moderate  pain, fever or headache.   albuterol 108 (90 Base) MCG/ACT inhaler Commonly known as: VENTOLIN HFA Inhale 2 puffs into the lungs every 6 (six) hours as needed for wheezing or shortness of breath.   aspirin EC 81 MG tablet Take 81 mg by mouth daily.   atorvastatin 20 MG tablet Commonly known as: LIPITOR TAKE 1 TABLET BY MOUTH DAILY   clopidogrel 75 MG  tablet Commonly known as: PLAVIX TAKE 1 TABLET BY MOUTH DAILY   dapagliflozin propanediol 10 MG Tabs tablet Commonly known as: Farxiga Take 1 tablet (10 mg total) by mouth daily before breakfast.   furosemide 20 MG tablet Commonly known as: LASIX Take 1 tablet (20 mg total) by mouth every other day.   guaiFENesin-dextromethorphan 100-10 MG/5ML syrup Commonly known as: ROBITUSSIN DM Take 5 mLs by mouth every 4 (four) hours as needed for cough (chest congestion).   ipratropium-albuterol 0.5-2.5 (3) MG/3ML Soln Commonly known as: DUONEB Take 3 mLs by nebulization every 6 (six) hours as needed.   latanoprost 0.005 % ophthalmic solution Commonly known as: XALATAN Place 1 drop into the left eye at bedtime.   levothyroxine 175 MCG tablet Commonly known as: SYNTHROID Take 175 mcg by mouth See admin instructions. 175 mcg every 3rd day.   levothyroxine 150 MCG tablet Commonly known as: SYNTHROID Take 150 mcg by mouth See admin instructions. 150 mcg once daily for 2 days, 170mcg every 3rd day - repeat.   lisinopril 20 MG tablet Commonly known as: ZESTRIL Take 20 mg by mouth daily.   ondansetron 4 MG disintegrating tablet Commonly known as: ZOFRAN-ODT Take 1 tablet (4 mg total) by mouth every 8 (eight) hours as needed for nausea or vomiting.   prednisoLONE acetate 1 % ophthalmic suspension Commonly known as: PRED FORTE Place 1 drop into the left eye daily.   predniSONE 10 MG tablet Commonly known as: DELTASONE Take 1 tablet (10 mg total) by mouth daily. Take 4 pills daily for 3 days then 2 pills daily for 3 days then 1 pill daily for 3 days then  stop Start taking on: April 02, 2023   Prolensa 0.07 % Soln Generic drug: Bromfenac Sodium Place 1 drop into the left eye daily.   tamsulosin 0.4 MG Caps capsule Commonly known as: FLOMAX Take 0.8 mg by mouth at bedtime.               Durable Medical Equipment  (From admission, onward)           Start     Ordered    04/01/23 1125  For home use only DME oxygen  Once       Question Answer Comment  Length of Need Lifetime   Mode or (Route) Nasal cannula   Liters per Minute 3   Frequency Continuous (stationary and portable oxygen unit needed)   Oxygen delivery system Gas      04/01/23 1124   04/01/23 0000  For home use only DME Nebulizer machine       Question Answer Comment  Patient needs a nebulizer to treat with the following condition COPD exacerbation   Length of Need Lifetime      04/01/23 1127            Follow-up Information     Burdine, Virgina Evener, MD. Schedule an appointment as soon as possible for a visit in 1 week(s).   Specialty: Family Medicine Contact information: [address] [place] [address] [phone]  Allergies  Allergen Reactions   Erythromycin Swelling    Lip swelling   Shellfish Allergy Rash and Other (See Comments)    Gout flares   Nitrofuran Derivatives Swelling   Nitrostat [Nitroglycerin] Other (See Comments)    Severe hypotension   Zithromax [Azithromycin] Swelling    Consultations: None   Procedures/Studies: DG CHEST PORT 1 VIEW  Result Date: 03/30/2023 CLINICAL DATA:  Cough and shortness of breath. EXAM: PORTABLE CHEST 1 VIEW COMPARISON:  Chest CT 03/28/2023 and abdominal CT 12/26/2022. Radiographs 03/28/2023 and 05/27/2022. FINDINGS: 0902 hours for the heart size and mediastinal contours are stable. There is aortic atherosclerosis. Chronic asymmetric elevation of the right hemidiaphragm with associated right basilar atelectasis, at least in part chronic. The patchy superimposed basilar opacities are unchanged from the recent prior radiographs and CT. No progressive airspace disease, pleural effusion or pneumothorax. The bones appear unchanged. IMPRESSION: No significant changes from recent prior studies. Chronic right basilar atelectasis without progressive airspace disease. Electronically Signed   By: Richardean Sale M.D.   On:  03/30/2023 09:32   CT Angio Chest Pulmonary Embolism (PE) W or WO Contrast  Result Date: 03/28/2023 CLINICAL DATA:  PE suspected, worsening shortness of breath EXAM: CT ANGIOGRAPHY CHEST WITH CONTRAST TECHNIQUE: Multidetector CT imaging of the chest was performed using the standard protocol during bolus administration of intravenous contrast. Multiplanar CT image reconstructions and MIPs were obtained to evaluate the vascular anatomy. RADIATION DOSE REDUCTION: This exam was performed according to the departmental dose-optimization program which includes automated exposure control, adjustment of the mA and/or kV according to patient size and/or use of iterative reconstruction technique. CONTRAST:  46mL OMNIPAQUE IOHEXOL 350 MG/ML SOLN COMPARISON:  03/30/2022 FINDINGS: Cardiovascular: Examination for pulmonary embolism is somewhat limited by breath motion artifact, particularly in the lung bases. Within this limitation, negative examination for pulmonary embolism through the proximal segmental pulmonary arterial level. Normal heart size. Three-vessel coronary artery calcifications. No pericardial effusion. Aortic atherosclerosis. Mediastinum/Nodes: No enlarged mediastinal, hilar, or axillary lymph nodes. Thyroid gland, trachea, and esophagus demonstrate no significant findings. Lungs/Pleura: Heterogeneous consolidative airspace opacity throughout the bilateral lung bases, worst in the dependent left lung base (series 6, image 117). Probable underlying scarring and volume loss of the right lung base (series 6, image 84). No pleural effusion or pneumothorax. Upper Abdomen: No acute abnormality. Musculoskeletal: No chest wall abnormality. No acute osseous findings. Review of the MIP images confirms the above findings. IMPRESSION: 1. Examination for pulmonary embolism is somewhat limited by breath motion artifact, particularly in the lung bases. Within this limitation, negative examination for pulmonary embolism  through the proximal segmental pulmonary arterial level. 2. Heterogeneous consolidative airspace opacity throughout the bilateral lung bases, worst in the dependent left lung base. Findings are consistent with multifocal infection or aspiration. 3. Probable underlying scarring and volume loss of the right lung base. 4. Coronary artery disease. Aortic Atherosclerosis (ICD10-I70.0). Electronically Signed   By: Delanna Ahmadi M.D.   On: 03/28/2023 14:37   DG Chest Port 1 View  Result Date: 03/28/2023 CLINICAL DATA:  78 year old male with history of shortness of breath. EXAM: PORTABLE CHEST 1 VIEW COMPARISON:  Chest x-ray 05/27/2022. FINDINGS: Lung volumes are low. Widespread interstitial prominence and peribronchial cuffing. No consolidative airspace disease. No pleural effusions. No pneumothorax. No pulmonary nodule or mass noted. Pulmonary vasculature and the cardiomediastinal silhouette are within normal limits. Atherosclerotic calcifications in the thoracic aorta. IMPRESSION: 1. Diffuse interstitial prominence and peribronchial cuffing, which may suggest an acute bronchitis. 2. Aortic atherosclerosis.  Electronically Signed   By: Vinnie Langton M.D.   On: 03/28/2023 10:52      Subjective: Patient seen and examined at bedside today.  Hemodynamically stable.  He feels better today.  Cough is better.  Less wheezing.  He really wants to go home.  He qualified for home oxygen.  Discharge Exam: Vitals:   04/01/23 0812 04/01/23 0814  BP:    Pulse:    Resp:    Temp:    SpO2: 91% 96%   Vitals:   03/31/23 2350 04/01/23 0407 04/01/23 0812 04/01/23 0814  BP: (!) 164/63 (!) 154/64    Pulse:  (!) 58    Resp:  18    Temp:  98.4 F (36.9 C)    TempSrc:  Oral    SpO2:  94% 91% 96%  Weight:      Height:        General: Pt is alert, awake, not in acute distress Cardiovascular: RRR, S1/S2 +, no rubs, no gallops Respiratory: Diminished sounds bilaterally, mild expiratory bilateral wheezing Abdominal:  Soft, NT, ND, bowel sounds + Extremities: no edema, no cyanosis    The results of significant diagnostics from this hospitalization (including imaging, microbiology, ancillary and laboratory) are listed below for reference.     Microbiology: Recent Results (from the past 240 hour(s))  Resp panel by RT-PCR (RSV, Flu A&B, Covid) Anterior Nasal Swab     Status: None   Collection Time: 03/26/23 11:13 AM   Specimen: Anterior Nasal Swab  Result Value Ref Range Status   SARS Coronavirus 2 by RT PCR NEGATIVE NEGATIVE Final    Comment: (NOTE) SARS-CoV-2 target nucleic acids are NOT DETECTED.  The SARS-CoV-2 RNA is generally detectable in upper respiratory specimens during the acute phase of infection. The lowest concentration of SARS-CoV-2 viral copies this assay can detect is 138 copies/mL. A negative result does not preclude SARS-Cov-2 infection and should not be used as the sole basis for treatment or other patient management decisions. A negative result may occur with  improper specimen collection/handling, submission of specimen other than nasopharyngeal swab, presence of viral mutation(s) within the areas targeted by this assay, and inadequate number of viral copies(<138 copies/mL). A negative result must be combined with clinical observations, patient history, and epidemiological information. The expected result is Negative.  Fact Sheet for Patients:  EntrepreneurPulse.com.au  Fact Sheet for Healthcare Providers:  IncredibleEmployment.be  This test is no t yet approved or cleared by the [place] FDA and  has been authorized for detection and/or diagnosis of SARS-CoV-2 by FDA under an Emergency Use Authorization (EUA). This EUA will remain  in effect (meaning this test can be used) for the duration of the COVID-19 declaration under Section 564(b)(1) of the Act, 21 U.S.C.section 360bbb-3(b)(1), unless the authorization is terminated  or  revoked sooner.       Influenza A by PCR NEGATIVE NEGATIVE Final   Influenza B by PCR NEGATIVE NEGATIVE Final    Comment: (NOTE) The Xpert Xpress SARS-CoV-2/FLU/RSV plus assay is intended as an aid in the diagnosis of influenza from Nasopharyngeal swab specimens and should not be used as a sole basis for treatment. Nasal washings and aspirates are unacceptable for Xpert Xpress SARS-CoV-2/FLU/RSV testing.  Fact Sheet for Patients: EntrepreneurPulse.com.au  Fact Sheet for Healthcare Providers: IncredibleEmployment.be  This test is not yet approved or cleared by the [place] FDA and has been authorized for detection and/or diagnosis of SARS-CoV-2 by FDA under an Emergency Use Authorization (EUA). This EUA  will remain in effect (meaning this test can be used) for the duration of the COVID-19 declaration under Section 564(b)(1) of the Act, 21 U.S.C. section 360bbb-3(b)(1), unless the authorization is terminated or revoked.     Resp Syncytial Virus by PCR NEGATIVE NEGATIVE Final    Comment: (NOTE) Fact Sheet for Patients: EntrepreneurPulse.com.au  Fact Sheet for Healthcare Providers: IncredibleEmployment.be  This test is not yet approved or cleared by the [place] FDA and has been authorized for detection and/or diagnosis of SARS-CoV-2 by FDA under an Emergency Use Authorization (EUA). This EUA will remain in effect (meaning this test can be used) for the duration of the COVID-19 declaration under Section 564(b)(1) of the Act, 21 U.S.C. section 360bbb-3(b)(1), unless the authorization is terminated or revoked.  Performed at [facility], [address], [place], [place] [address]   Culture, blood (routine x 2)     Status: None (Preliminary result)   Collection Time: 03/28/23 10:40 AM   Specimen: Left Antecubital; Blood  Result Value Ref Range Status   Specimen Description LEFT ANTECUBITAL  Final    Special Requests   Final    BOTTLES DRAWN AEROBIC AND ANAEROBIC Blood Culture adequate volume   Culture   Final    NO GROWTH 4 DAYS Performed at [facility], [address] [place], [place] [address]    Report Status PENDING  Incomplete  Resp panel by RT-PCR (RSV, Flu A&B, Covid) Anterior Nasal Swab     Status: None   Collection Time: 03/28/23 10:42 AM   Specimen: Anterior Nasal Swab  Result Value Ref Range Status   SARS Coronavirus 2 by RT PCR NEGATIVE NEGATIVE Final    Comment: (NOTE) SARS-CoV-2 target nucleic acids are NOT DETECTED.  The SARS-CoV-2 RNA is generally detectable in upper respiratory specimens during the acute phase of infection. The lowest concentration of SARS-CoV-2 viral copies this assay can detect is 138 copies/mL. A negative result does not preclude SARS-Cov-2 infection and should not be used as the sole basis for treatment or other patient management decisions. A negative result may occur with  improper specimen collection/handling, submission of specimen other than nasopharyngeal swab, presence of viral mutation(s) within the areas targeted by this assay, and inadequate number of viral copies(<138 copies/mL). A negative result must be combined with clinical observations, patient history, and epidemiological information. The expected result is Negative.  Fact Sheet for Patients:  EntrepreneurPulse.com.au  Fact Sheet for Healthcare Providers:  IncredibleEmployment.be  This test is no t yet approved or cleared by the [place] FDA and  has been authorized for detection and/or diagnosis of SARS-CoV-2 by FDA under an Emergency Use Authorization (EUA). This EUA will remain  in effect (meaning this test can be used) for the duration of the COVID-19 declaration under Section 564(b)(1) of the Act, 21 U.S.C.section 360bbb-3(b)(1), unless the authorization is terminated  or revoked sooner.       Influenza A by PCR  NEGATIVE NEGATIVE Final   Influenza B by PCR NEGATIVE NEGATIVE Final    Comment: (NOTE) The Xpert Xpress SARS-CoV-2/FLU/RSV plus assay is intended as an aid in the diagnosis of influenza from Nasopharyngeal swab specimens and should not be used as a sole basis for treatment. Nasal washings and aspirates are unacceptable for Xpert Xpress SARS-CoV-2/FLU/RSV testing.  Fact Sheet for Patients: EntrepreneurPulse.com.au  Fact Sheet for Healthcare Providers: IncredibleEmployment.be  This test is not yet approved or cleared by the [place] FDA and has been authorized for detection and/or diagnosis of SARS-CoV-2  by FDA under an Emergency Use Authorization (EUA). This EUA will remain in effect (meaning this test can be used) for the duration of the COVID-19 declaration under Section 564(b)(1) of the Act, 21 U.S.C. section 360bbb-3(b)(1), unless the authorization is terminated or revoked.     Resp Syncytial Virus by PCR NEGATIVE NEGATIVE Final    Comment: (NOTE) Fact Sheet for Patients: EntrepreneurPulse.com.au  Fact Sheet for Healthcare Providers: IncredibleEmployment.be  This test is not yet approved or cleared by the [place] FDA and has been authorized for detection and/or diagnosis of SARS-CoV-2 by FDA under an Emergency Use Authorization (EUA). This EUA will remain in effect (meaning this test can be used) for the duration of the COVID-19 declaration under Section 564(b)(1) of the Act, 21 U.S.C. section 360bbb-3(b)(1), unless the authorization is terminated or revoked.  Performed at [facility], [address], [place], [place] [address]   Culture, blood (routine x 2)     Status: None (Preliminary result)   Collection Time: 03/28/23 11:13 AM   Specimen: BLOOD  Result Value Ref Range Status   Specimen Description BLOOD RIGHT ANTECUBITAL  Final   Special Requests   Final    BOTTLES DRAWN AEROBIC  AND ANAEROBIC Blood Culture adequate volume   Culture   Final    NO GROWTH 4 DAYS Performed at [facility], [address], [place], [place] [address]    Report Status PENDING  Incomplete  MRSA Next Gen by PCR, Nasal     Status: None   Collection Time: 03/28/23  4:29 PM   Specimen: Nasal Mucosa; Nasal Swab  Result Value Ref Range Status   MRSA by PCR Next Gen NOT DETECTED NOT DETECTED Final    Comment: (NOTE) The GeneXpert MRSA Assay (FDA approved for NASAL specimens only), is one component of a comprehensive MRSA colonization surveillance program. It is not intended to diagnose MRSA infection nor to guide or monitor treatment for MRSA infections. Test performance is not FDA approved in patients less than 63 years old. Performed at [facility], [address], [place], [place] [address]   Respiratory (~20 pathogens) panel by PCR     Status: Abnormal   Collection Time: 03/29/23  8:10 AM   Specimen: Nasopharyngeal Swab; Respiratory  Result Value Ref Range Status   Adenovirus NOT DETECTED NOT DETECTED Final   Coronavirus 229E NOT DETECTED NOT DETECTED Final    Comment: (NOTE) The Coronavirus on the Respiratory Panel, DOES NOT test for the novel  Coronavirus (2019 nCoV)    Coronavirus HKU1 NOT DETECTED NOT DETECTED Final   Coronavirus NL63 NOT DETECTED NOT DETECTED Final   Coronavirus OC43 NOT DETECTED NOT DETECTED Final   Metapneumovirus NOT DETECTED NOT DETECTED Final   Rhinovirus / Enterovirus DETECTED (A) NOT DETECTED Final   Influenza A NOT DETECTED NOT DETECTED Final   Influenza B NOT DETECTED NOT DETECTED Final   Parainfluenza Virus 1 NOT DETECTED NOT DETECTED Final   Parainfluenza Virus 2 NOT DETECTED NOT DETECTED Final   Parainfluenza Virus 3 NOT DETECTED NOT DETECTED Final   Parainfluenza Virus 4 NOT DETECTED NOT DETECTED Final   Respiratory Syncytial Virus NOT DETECTED NOT DETECTED Final   Bordetella pertussis NOT DETECTED NOT DETECTED Final   Bordetella Parapertussis  NOT DETECTED NOT DETECTED Final   Chlamydophila pneumoniae NOT DETECTED NOT DETECTED Final   Mycoplasma pneumoniae NOT DETECTED NOT DETECTED Final    Comment: Performed at [facility], [address], [place], [place] [address]  Labs: BNP (last 3 results) Recent Labs    05/27/22 1352  BNP 0000000   Basic Metabolic Panel: Recent Labs  Lab 03/28/23 1353 03/28/23 1818 03/29/23 0305 03/30/23 0515 03/31/23 0401  NA 137 134* 140 138 137  K 3.8 4.0 4.4 4.7 4.4  CL 103 104 109 107 103  CO2 18* 20* 21* 25 27  GLUCOSE 235* 309* 140* 186* 208*  BUN 23 23 24* 30* 34*  CREATININE 1.32* 1.11 0.86 0.92 0.93  CALCIUM 8.4* 7.8* 7.9* 8.2* 8.3*   Liver Function Tests: Recent Labs  Lab 03/28/23 1353 03/29/23 0305  AST 25 16  ALT 21 18  ALKPHOS 59 52  BILITOT 1.3* 0.6  PROT 6.9 6.4*  ALBUMIN 3.5 3.0*   No results for input(s): "LIPASE", "AMYLASE" in the last 168 hours. No results for input(s): "AMMONIA" in the last 168 hours. CBC: Recent Labs  Lab 03/28/23 1032 03/29/23 0305 03/30/23 0515  WBC 8.4 4.8 7.7  NEUTROABS 6.8  --   --   HGB 14.4 12.3* 12.4*  HCT 43.2 37.6* 38.2*  MCV 92.9 94.5 93.9  PLT 125* 113* 137*   Cardiac Enzymes: No results for input(s): "CKTOTAL", "CKMB", "CKMBINDEX", "TROPONINI" in the last 168 hours. BNP: Invalid input(s): "POCBNP" CBG: Recent Labs  Lab 03/31/23 1120 03/31/23 1559 03/31/23 2103 04/01/23 0720 04/01/23 1100  GLUCAP 261* 241* 129* 174* 235*   D-Dimer No results for input(s): "DDIMER" in the last 72 hours. Hgb A1c No results for input(s): "HGBA1C" in the last 72 hours. Lipid Profile No results for input(s): "CHOL", "HDL", "LDLCALC", "TRIG", "CHOLHDL", "LDLDIRECT" in the last 72 hours. Thyroid function studies No results for input(s): "TSH", "T4TOTAL", "T3FREE", "THYROIDAB" in the last 72 hours.  Invalid input(s): "FREET3" Anemia work up No results for input(s): "VITAMINB12", "FOLATE", "FERRITIN", "TIBC", "IRON",  "RETICCTPCT" in the last 72 hours. Urinalysis    Component Value Date/Time   COLORURINE YELLOW 03/28/2023 1629   APPEARANCEUR CLEAR 03/28/2023 1629   LABSPEC 1.045 (H) 03/28/2023 1629   PHURINE 5.0 03/28/2023 1629   GLUCOSEU >=500 (A) 03/28/2023 1629   HGBUR NEGATIVE 03/28/2023 1629   BILIRUBINUR NEGATIVE 03/28/2023 1629   KETONESUR 20 (A) 03/28/2023 1629   PROTEINUR NEGATIVE 03/28/2023 1629   UROBILINOGEN 1.0 06/23/2010 0752   NITRITE NEGATIVE 03/28/2023 1629   LEUKOCYTESUR NEGATIVE 03/28/2023 1629   Sepsis Labs Recent Labs  Lab 03/28/23 1032 03/29/23 0305 03/30/23 0515  WBC 8.4 4.8 7.7   Microbiology Recent Results (from the past 240 hour(s))  Resp panel by RT-PCR (RSV, Flu A&B, Covid) Anterior Nasal Swab     Status: None   Collection Time: 03/26/23 11:13 AM   Specimen: Anterior Nasal Swab  Result Value Ref Range Status   SARS Coronavirus 2 by RT PCR NEGATIVE NEGATIVE Final    Comment: (NOTE) SARS-CoV-2 target nucleic acids are NOT DETECTED.  The SARS-CoV-2 RNA is generally detectable in upper respiratory specimens during the acute phase of infection. The lowest concentration of SARS-CoV-2 viral copies this assay can detect is 138 copies/mL. A negative result does not preclude SARS-Cov-2 infection and should not be used as the sole basis for treatment or other patient management decisions. A negative result may occur with  improper specimen collection/handling, submission of specimen other than nasopharyngeal swab, presence of viral mutation(s) within the areas targeted by this assay, and inadequate number of viral copies(<138 copies/mL). A negative result must be combined with clinical observations, patient history, and epidemiological information. The expected result is Negative.  Fact Sheet for Patients:  EntrepreneurPulse.com.au  Fact Sheet for Healthcare Providers:  IncredibleEmployment.be  This test is no t yet approved  or cleared by the [place] FDA and  has been authorized for detection and/or diagnosis of SARS-CoV-2 by FDA under an Emergency Use Authorization (EUA). This EUA will remain  in effect (meaning this test can be used) for the duration of the COVID-19 declaration under Section 564(b)(1) of the Act, 21 U.S.C.section 360bbb-3(b)(1), unless the authorization is terminated  or revoked sooner.       Influenza A by PCR NEGATIVE NEGATIVE Final   Influenza B by PCR NEGATIVE NEGATIVE Final    Comment: (NOTE) The Xpert Xpress SARS-CoV-2/FLU/RSV plus assay is intended as an aid in the diagnosis of influenza from Nasopharyngeal swab specimens and should not be used as a sole basis for treatment. Nasal washings and aspirates are unacceptable for Xpert Xpress SARS-CoV-2/FLU/RSV testing.  Fact Sheet for Patients: EntrepreneurPulse.com.au  Fact Sheet for Healthcare Providers: IncredibleEmployment.be  This test is not yet approved or cleared by the [place] FDA and has been authorized for detection and/or diagnosis of SARS-CoV-2 by FDA under an Emergency Use Authorization (EUA). This EUA will remain in effect (meaning this test can be used) for the duration of the COVID-19 declaration under Section 564(b)(1) of the Act, 21 U.S.C. section 360bbb-3(b)(1), unless the authorization is terminated or revoked.     Resp Syncytial Virus by PCR NEGATIVE NEGATIVE Final    Comment: (NOTE) Fact Sheet for Patients: EntrepreneurPulse.com.au  Fact Sheet for Healthcare Providers: IncredibleEmployment.be  This test is not yet approved or cleared by the [place] FDA and has been authorized for detection and/or diagnosis of SARS-CoV-2 by FDA under an Emergency Use Authorization (EUA). This EUA will remain in effect (meaning this test can be used) for the duration of the COVID-19 declaration under Section 564(b)(1) of the Act,  21 U.S.C. section 360bbb-3(b)(1), unless the authorization is terminated or revoked.  Performed at [facility], [address] [place], [place] [address]   Culture, blood (routine x 2)     Status: None (Preliminary result)   Collection Time: 03/28/23 10:40 AM   Specimen: Left Antecubital; Blood  Result Value Ref Range Status   Specimen Description LEFT ANTECUBITAL  Final   Special Requests   Final    BOTTLES DRAWN AEROBIC AND ANAEROBIC Blood Culture adequate volume   Culture   Final    NO GROWTH 4 DAYS Performed at [facility], [address], [place], [place] [address]    Report Status PENDING  Incomplete  Resp panel by RT-PCR (RSV, Flu A&B, Covid) Anterior Nasal Swab     Status: None   Collection Time: 03/28/23 10:42 AM   Specimen: Anterior Nasal Swab  Result Value Ref Range Status   SARS Coronavirus 2 by RT PCR NEGATIVE NEGATIVE Final    Comment: (NOTE) SARS-CoV-2 target nucleic acids are NOT DETECTED.  The SARS-CoV-2 RNA is generally detectable in upper respiratory specimens during the acute phase of infection. The lowest concentration of SARS-CoV-2 viral copies this assay can detect is 138 copies/mL. A negative result does not preclude SARS-Cov-2 infection and should not be used as the sole basis for treatment or other patient management decisions. A negative result may occur with  improper specimen collection/handling, submission of specimen other than nasopharyngeal swab, presence of viral mutation(s) within the areas targeted by this assay, and inadequate number of viral copies(<138 copies/mL). A negative result must be combined with clinical observations, patient  history, and epidemiological information. The expected result is Negative.  Fact Sheet for Patients:  EntrepreneurPulse.com.au  Fact Sheet for Healthcare Providers:  IncredibleEmployment.be  This test is no t yet approved or cleared by the [place] FDA and  has  been authorized for detection and/or diagnosis of SARS-CoV-2 by FDA under an Emergency Use Authorization (EUA). This EUA will remain  in effect (meaning this test can be used) for the duration of the COVID-19 declaration under Section 564(b)(1) of the Act, 21 U.S.C.section 360bbb-3(b)(1), unless the authorization is terminated  or revoked sooner.       Influenza A by PCR NEGATIVE NEGATIVE Final   Influenza B by PCR NEGATIVE NEGATIVE Final    Comment: (NOTE) The Xpert Xpress SARS-CoV-2/FLU/RSV plus assay is intended as an aid in the diagnosis of influenza from Nasopharyngeal swab specimens and should not be used as a sole basis for treatment. Nasal washings and aspirates are unacceptable for Xpert Xpress SARS-CoV-2/FLU/RSV testing.  Fact Sheet for Patients: EntrepreneurPulse.com.au  Fact Sheet for Healthcare Providers: IncredibleEmployment.be  This test is not yet approved or cleared by the [place] FDA and has been authorized for detection and/or diagnosis of SARS-CoV-2 by FDA under an Emergency Use Authorization (EUA). This EUA will remain in effect (meaning this test can be used) for the duration of the COVID-19 declaration under Section 564(b)(1) of the Act, 21 U.S.C. section 360bbb-3(b)(1), unless the authorization is terminated or revoked.     Resp Syncytial Virus by PCR NEGATIVE NEGATIVE Final    Comment: (NOTE) Fact Sheet for Patients: EntrepreneurPulse.com.au  Fact Sheet for Healthcare Providers: IncredibleEmployment.be  This test is not yet approved or cleared by the [place] FDA and has been authorized for detection and/or diagnosis of SARS-CoV-2 by FDA under an Emergency Use Authorization (EUA). This EUA will remain in effect (meaning this test can be used) for the duration of the COVID-19 declaration under Section 564(b)(1) of the Act, 21 U.S.C. section 360bbb-3(b)(1), unless the  authorization is terminated or revoked.  Performed at [facility], [address] [place], [place] [address]   Culture, blood (routine x 2)     Status: None (Preliminary result)   Collection Time: 03/28/23 11:13 AM   Specimen: BLOOD  Result Value Ref Range Status   Specimen Description BLOOD RIGHT ANTECUBITAL  Final   Special Requests   Final    BOTTLES DRAWN AEROBIC AND ANAEROBIC Blood Culture adequate volume   Culture   Final    NO GROWTH 4 DAYS Performed at [facility], [address] [place], [place] [address]    Report Status PENDING  Incomplete  MRSA Next Gen by PCR, Nasal     Status: None   Collection Time: 03/28/23  4:29 PM   Specimen: Nasal Mucosa; Nasal Swab  Result Value Ref Range Status   MRSA by PCR Next Gen NOT DETECTED NOT DETECTED Final    Comment: (NOTE) The GeneXpert MRSA Assay (FDA approved for NASAL specimens only), is one component of a comprehensive MRSA colonization surveillance program. It is not intended to diagnose MRSA infection nor to guide or monitor treatment for MRSA infections. Test performance is not FDA approved in patients less than 66 years old. Performed at [facility], [address] [place],  [address]   Respiratory (~20 pathogens) panel by PCR     Status: Abnormal   Collection Time: 03/29/23  8:10 AM   Specimen: Nasopharyngeal Swab; Respiratory  Result Value Ref Range Status   Adenovirus NOT DETECTED  NOT DETECTED Final   Coronavirus 229E NOT DETECTED NOT DETECTED Final    Comment: (NOTE) The Coronavirus on the Respiratory Panel, DOES NOT test for the novel  Coronavirus (2019 nCoV)    Coronavirus HKU1 NOT DETECTED NOT DETECTED Final   Coronavirus NL63 NOT DETECTED NOT DETECTED Final   Coronavirus OC43 NOT DETECTED NOT DETECTED Final   Metapneumovirus NOT DETECTED NOT DETECTED Final   Rhinovirus / Enterovirus DETECTED (A) NOT DETECTED Final   Influenza A NOT DETECTED NOT DETECTED Final   Influenza B NOT DETECTED NOT  DETECTED Final   Parainfluenza Virus 1 NOT DETECTED NOT DETECTED Final   Parainfluenza Virus 2 NOT DETECTED NOT DETECTED Final   Parainfluenza Virus 3 NOT DETECTED NOT DETECTED Final   Parainfluenza Virus 4 NOT DETECTED NOT DETECTED Final   Respiratory Syncytial Virus NOT DETECTED NOT DETECTED Final   Bordetella pertussis NOT DETECTED NOT DETECTED Final   Bordetella Parapertussis NOT DETECTED NOT DETECTED Final   Chlamydophila pneumoniae NOT DETECTED NOT DETECTED Final   Mycoplasma pneumoniae NOT DETECTED NOT DETECTED Final    Comment: Performed at [facility], [place] [address], [place], [place] [address]    Please note: You were cared for by a hospitalist during your hospital stay. Once you are discharged, your primary care physician will handle any further medical issues. Please note that NO REFILLS for any discharge medications will be authorized once you are discharged, as it is imperative that you return to your primary care physician (or establish a relationship with a primary care physician if you do not have one) for your post hospital discharge needs so that they can reassess your need for medications and monitor your lab values.    Time coordinating discharge: 40 minutes  SIGNED:   Shelly Coss, MD  [facility] 04/01/2023, 11:32 AM Pager [phone]  If 7PM-7AM, please contact night-coverage [email or web address] Password [email or web address]

## 2023-04-01 NOTE — Progress Notes (Deleted)
SATURATION QUALIFICATIONS: (This note is used to comply with regulatory documentation for home oxygen)  Patient Saturations on Room Air at Rest = 84%  Patient Saturations on Room Air while Ambulating = 82%   Please briefly explain why patient needs home oxygen:

## 2023-04-01 NOTE — Consult Note (Signed)
   Christopher [facility] CM Inpatient Consult   04/01/2023  Christopher Randolph 12/15/1946 [id number]  Orientation with Christopher Shanks, RN [facility] Liaison for review.   Location: [facility] RN [facility] Liaison screen remotely([facility]).   [facility] Customer service manager [facility]) Accountable Care Organization [ACO] Patient: Insurance [name])    Primary Care Provider:  Juliette Alcide, MD Family Practice ([facility])   Patient screened for readmission hospitalization with noted high  risk score for unplanned readmission risk with 1 IP/ 2 ED in 6 months. [facility] RN liaison will assess for potential [facility]) Care Management service needs for post Randolph transition for care coordination. 4/4 Addendum 10:10 AM- Spoke with pt today concerning [facility] services and offered post-Randolph follow up calls for a [facility] RN care coordinator. Pt very receptive and reports his "nebulizer medications was ordered on yesterday and should be in today". [facility] liaison strongly encouraged pt to follow for continuous medication delivery as ordered. Pt also indicated he is on home O2 however [employer] should be delivering his home concentrator set up today. Liaison verified pt has the contact number if there are any delays. [facility] benefits were discussed related to pharmacy, RN care manager and social worker if further assistance are needed with any of his ongoing issues. Pt very grateful with no other inquires or questions.    Plan:  [facility] liaison will make a referral for community care coordination: New home O2, nebulizer devices and related medications for home use.   [facility] Care Management/Population Health does not replace or interfere with any arrangements made by the Inpatient Transition of Care team.   For questions contact:    Christopher Cousin, RN, BSN [facility] Liaison [place]   [facility]  Population Health Office Hours M-F 8:00 am to 5 pm [phone] mobile [phone] [Office toll  free line][facility] Office Hours are M-F 8:30 - 5 pm 24 hour nurse advise line [phone] Conceirge  [email or web address]

## 2023-04-02 ENCOUNTER — Encounter: Payer: Self-pay | Admitting: *Deleted

## 2023-04-02 ENCOUNTER — Ambulatory Visit: Payer: Self-pay | Admitting: *Deleted

## 2023-04-02 ENCOUNTER — Telehealth: Payer: Self-pay | Admitting: *Deleted

## 2023-04-02 ENCOUNTER — Other Ambulatory Visit: Payer: Self-pay | Admitting: *Deleted

## 2023-04-02 DIAGNOSIS — J4489 Other specified chronic obstructive pulmonary disease: Secondary | ICD-10-CM | POA: Diagnosis not present

## 2023-04-02 DIAGNOSIS — I5033 Acute on chronic diastolic (congestive) heart failure: Secondary | ICD-10-CM | POA: Diagnosis not present

## 2023-04-02 DIAGNOSIS — J9601 Acute respiratory failure with hypoxia: Secondary | ICD-10-CM | POA: Diagnosis not present

## 2023-04-02 DIAGNOSIS — E119 Type 2 diabetes mellitus without complications: Secondary | ICD-10-CM | POA: Diagnosis not present

## 2023-04-02 LAB — CULTURE, BLOOD (ROUTINE X 2)
Culture: NO GROWTH
Culture: NO GROWTH
Special Requests: ADEQUATE
Special Requests: ADEQUATE

## 2023-04-02 NOTE — Progress Notes (Signed)
  Care Coordination   Note   04/02/2023 Name: STATLER BROAS MRN: [id number] DOB: 25-Apr-1946  [name] Cagnina is a 77 y.o. year old male who sees Burdine, Ananias Pilgrim, MD for primary care. I reached out to [name] by phone today to offer care coordination services.  Mr. Saunders was given information about Care Coordination services today including:   The Care Coordination services include support from the care team which includes your Nurse Coordinator, Clinical Social Worker, or Pharmacist.  The Care Coordination team is here to help remove barriers to the health concerns and goals most important to you. Care Coordination services are voluntary, and the patient may decline or stop services at any time by request to their care team member.   Care Coordination Consent Status: Patient agreed to services and verbal consent obtained.   Follow up plan:  Telephone appointment with care coordination team member scheduled for:  04/02/23  Encounter Outcome:  Pt. Scheduled  [facility] Coordination Care Guide  Direct Dial: [phone]

## 2023-04-02 NOTE — Patient Outreach (Signed)
Care Coordination   Initial Visit Note   04/02/2023 Name: Christopher CoyerWinfred L Randolph MRN: [id number] DOB: 09/27/1946  Christopher HeadingWinfred L Tiburcio PeaHarris is a 77 y.o. year old male who sees Burdine, Ananias PilgrimSteven E, MD for primary care. I spoke with  Christopher CoyerWinfred L [facility] by phone today.  What matters to the patients health and wellness today?  Managing COPD and CHF    Goals Addressed             This Visit's Progress    Manage CHF       Care Coordination Goals: Patient will monitor and record weights each morning after urinating Patient will call cardiologist with any weight gain of >2 lbs overnight or >5lbs in 1 week Patient will follow a low sodium, heart healthy diet Patient will reach out to cardiologist or PCP with any new or worsening symptoms Patient will monitor and record blood pressure daily and will reach out to provider with any readings outside of recommended range Patient will take medications as prescribed Patient will keep follow-up appointments Dr [facility] (cardio) 04/07/23 Dr [facility] (PCP) scheduled for one week hosp f/u      Manage COPD       Care Coordination Goals: Patient will keep all follow-up PCP and/or pulmonary appointments Dr Christopher [facility] (Pulmonary) 05/04/23 Dr [facility] (PCP) hosp scheduled within a week Patient will call provider with any new or worsening symptoms Patient will track and manage COPD triggers Patient will state understanding of pursed lip breathing for management of symptoms and will use as needed Patient will state understanding of proper use of medications used for management of COPD including inhaler (albuterol) and nebulizer (duoneb) Meds are ready for pickup at [facility]. [facility] to pickup today.  Patient will use medications as directed Patient will engage in light exercise as tolerated 3-5 days a week to aid in the the management of COPD Patient will use infection prevention strategies to reduce risk of respiratory infection Patient will state understanding of the  importance of adequate rest and management of fatigue with COPD Patient will continue to use O2 at 3 L per minute (or as directed by provider) per [place] via floor concentrator Provided [facility] [phone]  Patient will call RN Care Coordinator [phone] with any care coordination or resource needs         SDOH assessments and interventions completed:  Yes SDOH Interventions Today    Flowsheet Row Most Recent Value  SDOH Interventions   Transportation Interventions Intervention Not Indicated  Financial Strain Interventions Intervention Not Indicated        Care Coordination Interventions:  Yes, provided  Interventions Today    Flowsheet Row Most Recent Value  Chronic Disease   Chronic disease during today's visit Chronic Obstructive Pulmonary Disease (COPD), Congestive Heart Failure (CHF)  General Interventions   General Interventions Discussed/Reviewed General Interventions Discussed, General Interventions Reviewed, Doctor Visits, Durable Medical Equipment (DME)  Doctor Visits Discussed/Reviewed Doctor Visits Discussed, Doctor Visits Reviewed, Specialist, PCP  Durable Medical Equipment (DME) BP Cuff, Oxygen  [Scales, pulse ox]  PCP/Specialist Visits Compliance with follow-up visit  Exercise Interventions   Exercise Discussed/Reviewed Physical Activity  Physical Activity Discussed/Reviewed Physical Activity Discussed, Physical Activity Reviewed  Education Interventions   Education Provided Provided Education  Provided Verbal Education On Nutrition, When to see the doctor, Mental Health/Coping with Illness, Exercise  Nutrition Interventions   Nutrition Discussed/Reviewed Nutrition Discussed, Nutrition Reviewed, Decreasing salt, Fluid intake  [Patient does not add extra salt to meals or cook  with salt. 60 oz fluid restriciton]  Pharmacy Interventions   Pharmacy Dicussed/Reviewed Medications and their functions  Safety Interventions   Safety Discussed/Reviewed  Safety Discussed      TOC Interventions Today    Flowsheet Row Most Recent Value  TOC Interventions   TOC Interventions Discussed/Reviewed Contact DME company for patient use of equipment  [contacted [facility] and verified that floor concentrator and supplies was delivered. Contacted [facility] to verify that meds were ready but haven't been picked up yet]       Follow up plan: Follow up call scheduled for 04/09/23    Encounter Outcome:  Pt. Visit Completed   [facility], BSN, RN-BC RN Care Coordinator [facility]  [facility] Direct Dial: [phone] Main #: [phone]

## 2023-04-03 ENCOUNTER — Emergency Department (HOSPITAL_COMMUNITY)
Admission: EM | Admit: 2023-04-03 | Discharge: 2023-04-03 | Disposition: A | Discharge status: Home / Self Care | Payer: Medicare Other | Attending: Emergency Medicine | Admitting: Emergency Medicine

## 2023-04-03 ENCOUNTER — Encounter (HOSPITAL_COMMUNITY): Payer: Self-pay | Admitting: Emergency Medicine

## 2023-04-03 ENCOUNTER — Emergency Department (HOSPITAL_COMMUNITY): Payer: Medicare Other

## 2023-04-03 ENCOUNTER — Other Ambulatory Visit: Payer: Self-pay

## 2023-04-03 DIAGNOSIS — I2510 Atherosclerotic heart disease of native coronary artery without angina pectoris: Secondary | ICD-10-CM | POA: Diagnosis not present

## 2023-04-03 DIAGNOSIS — E119 Type 2 diabetes mellitus without complications: Secondary | ICD-10-CM | POA: Insufficient documentation

## 2023-04-03 DIAGNOSIS — R079 Chest pain, unspecified: Secondary | ICD-10-CM | POA: Diagnosis not present

## 2023-04-03 DIAGNOSIS — Z7982 Long term (current) use of aspirin: Secondary | ICD-10-CM | POA: Diagnosis not present

## 2023-04-03 DIAGNOSIS — R0981 Nasal congestion: Secondary | ICD-10-CM | POA: Diagnosis not present

## 2023-04-03 DIAGNOSIS — Z7984 Long term (current) use of oral hypoglycemic drugs: Secondary | ICD-10-CM | POA: Diagnosis not present

## 2023-04-03 DIAGNOSIS — Z7951 Long term (current) use of inhaled steroids: Secondary | ICD-10-CM | POA: Insufficient documentation

## 2023-04-03 DIAGNOSIS — R0602 Shortness of breath: Secondary | ICD-10-CM

## 2023-04-03 DIAGNOSIS — E039 Hypothyroidism, unspecified: Secondary | ICD-10-CM | POA: Insufficient documentation

## 2023-04-03 DIAGNOSIS — Z79899 Other long term (current) drug therapy: Secondary | ICD-10-CM | POA: Diagnosis not present

## 2023-04-03 DIAGNOSIS — I10 Essential (primary) hypertension: Secondary | ICD-10-CM | POA: Diagnosis not present

## 2023-04-03 DIAGNOSIS — J449 Chronic obstructive pulmonary disease, unspecified: Secondary | ICD-10-CM | POA: Diagnosis not present

## 2023-04-03 DIAGNOSIS — R059 Cough, unspecified: Secondary | ICD-10-CM | POA: Insufficient documentation

## 2023-04-03 LAB — CBC
HCT: 47.6 % (ref 39.0–52.0)
Hemoglobin: 15.5 g/dL (ref 13.0–17.0)
MCH: 30.4 pg (ref 26.0–34.0)
MCHC: 32.6 g/dL (ref 30.0–36.0)
MCV: 93.3 fL (ref 80.0–100.0)
Platelets: 193 10*3/uL (ref 150–400)
RBC: 5.1 MIL/uL (ref 4.22–5.81)
RDW: 13 % (ref 11.5–15.5)
WBC: 8.6 10*3/uL (ref 4.0–10.5)
nRBC: 0 % (ref 0.0–0.2)

## 2023-04-03 LAB — BASIC METABOLIC PANEL
Anion gap: 11 (ref 5–15)
BUN: 27 mg/dL — ABNORMAL HIGH (ref 8–23)
CO2: 29 mmol/L (ref 22–32)
Calcium: 8.4 mg/dL — ABNORMAL LOW (ref 8.9–10.3)
Chloride: 97 mmol/L — ABNORMAL LOW (ref 98–111)
Creatinine, Ser: 1.07 mg/dL (ref 0.61–1.24)
GFR, Estimated: >60 mL/min (ref >60)
Glucose, Bld: 207 mg/dL — ABNORMAL HIGH (ref 70–99)
Potassium: 4.3 mmol/L (ref 3.5–5.1)
Sodium: 137 mmol/L (ref 135–145)

## 2023-04-03 LAB — TROPONIN I (HIGH SENSITIVITY)
Troponin I (High Sensitivity): 4 ng/L (ref <18)
Troponin I (High Sensitivity): 4 ng/L (ref <18)

## 2023-04-03 MED ORDER — IPRATROPIUM BROMIDE 0.02 % IN SOLN
0.5000 mg | Freq: Once | RESPIRATORY_TRACT | Status: AC
  Administered 2023-04-03: 0.5 mg via RESPIRATORY_TRACT
  Filled 2023-04-03: qty 2.5

## 2023-04-03 MED ORDER — METHYLPREDNISOLONE SODIUM SUCC 125 MG IJ SOLR
80.0000 mg | Freq: Once | INTRAMUSCULAR | Status: AC
  Administered 2023-04-03: 80 mg via INTRAVENOUS
  Filled 2023-04-03: qty 2

## 2023-04-03 MED ORDER — ALBUTEROL SULFATE (2.5 MG/3ML) 0.083% IN NEBU
10.0000 mg/h | INHALATION_SOLUTION | Freq: Once | RESPIRATORY_TRACT | Status: AC
  Administered 2023-04-03: 10 mg/h via RESPIRATORY_TRACT
  Filled 2023-04-03: qty 12

## 2023-04-03 MED ORDER — ALBUTEROL SULFATE (2.5 MG/3ML) 0.083% IN NEBU
5.0000 mg | INHALATION_SOLUTION | Freq: Once | RESPIRATORY_TRACT | Status: AC
  Administered 2023-04-03: 5 mg via RESPIRATORY_TRACT
  Filled 2023-04-03: qty 6

## 2023-04-03 NOTE — Discharge Instructions (Signed)
Resume taking your prednisone tomorrow as directed.  Continue your albuterol nebulizer treatments every 4 hours.  Please follow-up with your primary care provider next week for recheck.  Return to the emergency department for any new or worsening symptoms.

## 2023-04-03 NOTE — ED Provider Notes (Signed)
[place] EMERGENCY DEPARTMENT AT [facility] Provider Note   CSN: [id number] Arrival date & time: 04/03/23  1349     History  Chief Complaint  Patient presents with   Shortness of Breath   Chest Pain    Christopher Randolph is a 77 y.o. male.   Shortness of Breath Associated symptoms: chest pain and cough   Associated symptoms: no abdominal pain, no fever, no rash and no vomiting   Chest Pain Associated symptoms: cough and shortness of breath   Associated symptoms: no abdominal pain, no back pain, no dizziness, no fever, no nausea, no vomiting and no weakness        Christopher Randolph is a 77 y.o. male with past medical history of PAD, hypothyroidism, prior TIA, AAA, coronary artery disease, hypertension type 2 diabetes, COPD who presents to the Emergency Department complaining of gradually worsening shortness of breath.  He was admitted to this hospital on 03/28/2023 for acute hypoxia, COPD exacerbation, and lactic acidosis.  He initially came to the emergency department for cough, congestion and shortness of breath.  He had respiratory viral panel that was positive for rhinovirus.  He had oxygen requirement in the hospital and was discharged home on oxygen at 2 L continuously.  He has been doing albuterol nebs at home along with oral steroids was initially doing well at time of discharge.  Yesterday, began having increased nasal congestion, cough and he checked his sats at home and was in the 80s on portable pulse oximeter.  He increased his O2 to 3 L and remained in the 80s.  He also complains of dull left-sided chest pain that he noticed today.  Pain does not radiate.  And does not worsen with deep breathing.  Home Medications Prior to Admission medications   Medication Sig Start Date End Date Taking? Authorizing Provider  acetaminophen (TYLENOL) 500 MG tablet Take 1,000 mg by mouth daily as needed for moderate pain, fever or headache.   Yes [provider]   albuterol (VENTOLIN HFA) 108 (90 Base) MCG/ACT inhaler Inhale 2 puffs into the lungs every 6 (six) hours as needed for wheezing or shortness of breath. 04/01/23   Burnadette Pop, MD  aspirin EC 81 MG tablet Take 81 mg by mouth daily.    [provider]  atorvastatin (LIPITOR) 20 MG tablet TAKE 1 TABLET BY MOUTH DAILY Patient taking differently: Take 20 mg by mouth daily. 06/03/22   Jonelle Sidle, MD  Bromfenac Sodium (PROLENSA) 0.07 % SOLN Place 1 drop into the left eye daily. 09/02/22   Rennis Chris, MD  clopidogrel (PLAVIX) 75 MG tablet TAKE 1 TABLET BY MOUTH DAILY 02/08/23   Jonelle Sidle, MD  dapagliflozin propanediol (FARXIGA) 10 MG TABS tablet Take 1 tablet (10 mg total) by mouth daily before breakfast. 12/22/22   Jonelle Sidle, MD  furosemide (LASIX) 20 MG tablet Take 1 tablet (20 mg total) by mouth every other day. 08/24/22   Jonelle Sidle, MD  guaiFENesin-dextromethorphan (ROBITUSSIN DM) 100-10 MG/5ML syrup Take 5 mLs by mouth every 4 (four) hours as needed for cough (chest congestion). 04/01/23   Burnadette Pop, MD  ipratropium-albuterol (DUONEB) 0.5-2.5 (3) MG/3ML SOLN Take 3 mLs by nebulization every 6 (six) hours as needed. 04/01/23   Burnadette Pop, MD  latanoprost (XALATAN) 0.005 % ophthalmic solution Place 1 drop into the left eye at bedtime. 02/11/22   [provider]  levothyroxine (SYNTHROID) 175 MCG tablet Take 175 mcg by mouth  See admin instructions. 175 mcg every 3rd day.    [provider]  levothyroxine (SYNTHROID, LEVOTHROID) 150 MCG tablet Take 150 mcg by mouth See admin instructions. 150 mcg once daily for 2 days, every 3rd day - repeat. 11/01/18   [provider]  lisinopril (ZESTRIL) 20 MG tablet Take 20 mg by mouth daily. 03/09/23   [provider]  ondansetron (ZOFRAN-ODT) 4 MG disintegrating tablet Take 1 tablet (4 mg total) by mouth every 8 (eight) hours as needed for nausea or vomiting. Patient not taking:  Reported on 03/17/2023 12/26/22   Loetta Rough, MD  prednisoLONE acetate (PRED FORTE) 1 % ophthalmic suspension Place 1 drop into the left eye daily.    [provider]  predniSONE (DELTASONE) 10 MG tablet Take 1 tablet (10 mg total) by mouth daily. Take 4 pills daily for 3 days then 2 pills daily for 3 days then 1 pill daily for 3 days then  stop 04/02/23   Burnadette Pop, MD  tamsulosin (FLOMAX) 0.4 MG CAPS capsule Take 0.8 mg by mouth at bedtime.    [provider]      Allergies    Erythromycin, Shellfish allergy, Nitrofuran derivatives, Nitrostat [nitroglycerin], and Zithromax [azithromycin]    Review of Systems   Review of Systems  Constitutional:  Negative for appetite change, chills and fever.  HENT:  Positive for congestion and rhinorrhea.   Respiratory:  Positive for cough and shortness of breath. Negative for chest tightness.   Cardiovascular:  Positive for chest pain. Negative for leg swelling.  Gastrointestinal:  Negative for abdominal pain, nausea and vomiting.  Genitourinary:  Negative for difficulty urinating.  Musculoskeletal:  Negative for back pain and myalgias.  Skin:  Negative for rash.  Neurological:  Negative for dizziness, tremors, syncope and weakness.    Physical Exam Updated Vital Signs BP 119/70 (BP Location: Left Arm)   Pulse 77   Temp 98.2 F (36.8 C) (Oral)   Resp (!) 24   Wt 76.7 kg   SpO2 92%   BMI 25.71 kg/m  Physical Exam Vitals and nursing note reviewed.  Constitutional:      Appearance: He is well-developed. He is not toxic-appearing.  HENT:     Mouth/Throat:     Mouth: Mucous membranes are moist.     Pharynx: No oropharyngeal exudate or posterior oropharyngeal erythema.  Cardiovascular:     Rate and Rhythm: Normal rate and regular rhythm.     Pulses: Normal pulses.  Pulmonary:     Breath sounds: No stridor. Wheezing present.     Comments: Diminished lung sounds. scattered inspiratory and expiratory wheezes  throughout.   increased work of breathing on 3 L oxygen by nasal cannula. Abdominal:     Palpations: Abdomen is soft.     Tenderness: There is no abdominal tenderness.  Musculoskeletal:        General: Normal range of motion.     Cervical back: Normal range of motion.     Right lower leg: No edema.     Left lower leg: No edema.  Skin:    General: Skin is warm.     Capillary Refill: Capillary refill takes less than 2 seconds.     Findings: No rash.  Neurological:     General: No focal deficit present.     Mental Status: He is alert.     Sensory: No sensory deficit.     Motor: No weakness.     ED Results / Procedures /  Treatments   Labs (all labs ordered are listed, but only abnormal results are displayed) Labs Reviewed  BASIC METABOLIC PANEL - Abnormal; Notable for the following components:      Result Value   Chloride 97 (*)    Glucose, Bld 207 (*)    BUN 27 (*)    Calcium 8.4 (*)    All other components within normal limits  CBC  TROPONIN I (HIGH SENSITIVITY)  TROPONIN I (HIGH SENSITIVITY)    EKG None  Radiology DG Chest Port 1 View  Result Date: 04/03/2023 CLINICAL DATA:  [id number] Chest pain [id number] EXAM: PORTABLE CHEST 1 VIEW COMPARISON:  March 30, 2023, March 28, 2023 FINDINGS: Evaluation is limited by patient rotation. The cardiomediastinal silhouette is unchanged in contour. Possible small LEFT pleural effusion. No pneumothorax. Patchy bibasilar opacities, minimally increased since March 28, 2023 IMPRESSION: Patchy bibasilar opacities, minimally increased since March 28, 2023. These are favored to reflect atelectasis. Electronically Signed   By: Meda Klinefelter M.D.   On: 04/03/2023 15:36    Procedures Procedures    Medications Ordered in ED Medications  albuterol (PROVENTIL,VENTOLIN) solution continuous neb (has no administration in time range)  ipratropium (ATROVENT) nebulizer solution 0.5 mg (has no administration in time range)  methylPREDNISolone sodium  succinate (SOLU-MEDROL) 125 mg/2 mL injection 80 mg (has no administration in time range)    ED Course/ Medical Decision Making/ A&P                             Medical Decision Making Patient returns to the emergency department for continued shortness of breath, nasal congestion and cough.  Admitted in the hospital on 03/28/2023.  Discharged home Thursday felt okay at home still on 2 L O2 but began feeling bad yesterday had increased oxygen and O2 sats reported at 80% on 3 L yesterday at home.  Routinely taking albuterol nebs and oral steroids.  Does not feel that he is improving.  Some left-sided chest pain today.  Differential would include but not limited to new onset pneumonia, COPD exacerbation, continued viral process, CHF, PE, sepsis considered but felt less likely given the patient is afebrile without hypoxia or hypotension  Patient's prior hospital visit included CT angio that was negative for evidence of PE.  Amount and/or Complexity of Data Reviewed Labs: ordered.    Details: Labs interpreted by me, no evidence of leukocytosis, chemistries show blood sugar of 207, BUN slightly elevated 27 this is improved from patient's baseline.  Serum creatinine unremarkable.  Troponins remain flat  Radiology: ordered.    Details: Chest x-ray today shows patchy bibasilar opacities mildly increased from March favored to reflect atelectasis ECG/medicine tests: ordered.    Details: EKG shows sinus rhythm with ventricular premature complex Discussion of management or test interpretation with external provider(s): On arrival patient had inspiratory and expiratory wheezes O2 sat in the low 90s on 3 L upon arrival.  Recently discharged from the hospital after having rhinovirus.  He was discharged home on steroids albuterol nebs and home O2.  Took his first dose of oral prednisone this morning, he was given 10 mg continuous neb on arrival along with 80 mg of Solu-Medrol.  On recheck, patient resting  comfortably.  His lung sounds have much improved and he reports feeling much better.  He has remained on 3 L here, continuing to maintain mid to low 90s oxygen saturation.  I attempted to reduce his oxygen to 2 L,  but O2 sat dropped to 88% while conversing.  Patient was placed back on 3 L and immediately improved to 91%  On recheck patient has had 2 albuterol neb treatments, Solu-Medrol and Atrovent.  Remains on 3 L O2 by nasal cannula, sats remain in the low 90s.  He reports feeling much better.  Lung sounds have improved.  I have offered hospital admission but patient declined stating that he prefers to manage his symptoms at home, has oxygen concentrator, prednisone and albuterol nebs at home. He was agreeable to ER return if his symptoms worsen.  Pt also seen by Dr. Denton Lank and care plan discussed.   Pt wi 0ll c 0losely f/u with PCP, return precautions discussed        Risk Prescription drug management.           Final Clinical Impression(s) / ED Diagnoses Final diagnoses:  Shortness of breath    Rx / DC Orders ED Discharge Orders     None         Pauline Aus, PA-C 04/06/23 1400    Cathren Laine, MD 04/07/23 1726

## 2023-04-03 NOTE — Progress Notes (Unsigned)
    Cardiology Office Note  Date: 04/03/2023   ID: Christopher Randolph, DOB [birth year], MRN [id number]  History of Present Illness: Christopher Randolph is a 77 y.o. male last seen in October 2023 by Ms. Strader PA-C, I reviewed the note.  He was hospitalized recently with acute hypoxic respiratory failure in the setting of rhinovirus, no clear evidence of pulmonary embolus by CT angiogram.  He was managed with oxygen supplementation, bronchodilators, and prednisone.  Physical Exam: VS:  There were no vitals taken for this visit., BMI There is no height or weight on file to calculate BMI.  Wt Readings from Last 3 Encounters:  03/28/23 169 lb 1.5 oz (76.7 kg)  03/26/23 167 lb (75.8 kg)  03/17/23 174 lb 6.4 oz (79.1 kg)    General: Patient appears comfortable at rest. HEENT: Conjunctiva and lids normal, oropharynx clear with moist mucosa. Neck: Supple, no elevated JVP or carotid bruits, no thyromegaly. Lungs: Clear to auscultation, nonlabored breathing at rest. Cardiac: Regular rate and rhythm, no S3 or significant systolic murmur, no pericardial rub. Abdomen: Soft, nontender, no hepatomegaly, bowel sounds present, no guarding or rebound. Extremities: No pitting edema, distal pulses 2+. Skin: Warm and dry. Musculoskeletal: No kyphosis. Neuropsychiatric: Alert and oriented x3, affect grossly appropriate.  ECG:  An ECG dated 03/28/2023 was personally reviewed today and demonstrated:  Sinus tachycardia with nonspecific T wave changes.  Labwork: 05/27/2022: B Natriuretic Peptide 43.0; Magnesium 1.7 03/29/2023: ALT 18; AST 16 03/30/2023: Hemoglobin 12.4; Platelets 137 03/31/2023: BUN 34; Creatinine, Ser 0.93; Potassium 4.4; Sodium 137     Component Value Date/Time   CHOL 88 03/26/2022 0127   TRIG 61 03/26/2022 0127   HDL 31 (L) 03/26/2022 0127   CHOLHDL 2.8 03/26/2022 0127   VLDL 12 03/26/2022 0127   LDLCALC 45 03/26/2022 0127   Other Studies Reviewed Today:  Echocardiogram 05/28/2022:  1.  Left ventricular ejection fraction, by estimation, is 60 to 65%. The  left ventricle has no regional wall motion abnormalities. There is mild  left ventricular hypertrophy.   2. Right ventricular systolic function is normal. The right ventricular  size is normal.   3. The aortic valve is tricuspid. There is moderate calcification of the  aortic valve. There is moderate thickening of the aortic valve.   Assessment and Plan:  1.  CAD managed medically with history of branch vessel and moderate RCA stenosis as of 2006.  Follow-up Lexiscan Myoview in June 2023 indicated evidence of previous inferior infarct scar but no active ischemia and LVEF 64%.  Echocardiogram at that time also showed normal LVEF without wall motion abnormalities.  2.  HFpEF, LVEF 60 to 65% by echocardiogram in June 2023.  He is on [place] and Lasix.  3.  Essential hypertension.  4.  Mixed hyperlipidemia.  Mixed hyperlipidemia on Lipitor.  Last LDL 45.  5.  Carotid artery disease status post right TCAR in March 2023.  Carotid Dopplers from December 2023 revealed 1 to 39% RICA stenosis and 40 to 59% LICA stenosis.  Disposition:  Follow up {follow up:15908}  Signed, Jonelle Sidle, M.D., F.A.C.C. [facility]

## 2023-04-03 NOTE — ED Triage Notes (Signed)
Pt complains of worsening SOB was dc from hospital 2 days ago on oxygen and today felt like could not get good breath. Sats were in the upper 80s on 3L [place]. Pt complains of left sided chest pain does not radiate.

## 2023-04-07 ENCOUNTER — Ambulatory Visit: Payer: Medicare Other | Attending: Cardiology | Admitting: Cardiology

## 2023-04-07 ENCOUNTER — Encounter: Payer: Self-pay | Admitting: Cardiology

## 2023-04-07 VITALS — BP 120/60 | HR 75 | Ht 68.0 in | Wt 165.0 lb

## 2023-04-07 DIAGNOSIS — E782 Mixed hyperlipidemia: Secondary | ICD-10-CM | POA: Diagnosis not present

## 2023-04-07 DIAGNOSIS — I5032 Chronic diastolic (congestive) heart failure: Secondary | ICD-10-CM

## 2023-04-07 DIAGNOSIS — I6523 Occlusion and stenosis of bilateral carotid arteries: Secondary | ICD-10-CM

## 2023-04-07 DIAGNOSIS — I25119 Atherosclerotic heart disease of native coronary artery with unspecified angina pectoris: Secondary | ICD-10-CM

## 2023-04-07 NOTE — Patient Instructions (Addendum)

## 2023-04-09 ENCOUNTER — Ambulatory Visit: Payer: Self-pay | Admitting: *Deleted

## 2023-04-09 ENCOUNTER — Encounter: Payer: Self-pay | Admitting: *Deleted

## 2023-04-09 NOTE — Patient Outreach (Signed)
Care Coordination   Follow Up Visit Note   04/09/2023 Name: Christopher Randolph MRN: [id number] DOB: [birth year]  Christopher Randolph is a 77 y.o. year old male who sees Burdine, Ananias Pilgrim, MD for primary care. I spoke with  [name] by phone today.  What matters to the patients health and wellness today?  Managing COPD and being able to wean off of supplemental O2, managing CHF    Goals Addressed             This Visit's Progress    Manage CHF   On track    Care Coordination Goals: Patient will monitor and record weights each morning after urinating Patient will call cardiologist with any weight gain of >2 lbs overnight or >5lbs in 1 week Patient will follow a low sodium, heart healthy diet Patient will reach out to cardiologist or PCP with any new or worsening symptoms Patient will monitor and record blood pressure daily and will reach out to provider with any readings outside of recommended range Patient will take medications as prescribed Patient will keep follow-up appointments Schedule appt in 6 months to f/u with cardio Patient will reach out to RN Care Coordinator [phone] with any care coordination or resource needs      Manage COPD   On track    Care Coordination Goals: Patient will keep all follow-up PCP and/or pulmonary appointments Dr Vassie Loll (Pulmonary) 05/04/23 Patient will call provider with any new or worsening symptoms Patient will track and manage COPD triggers Patient will state understanding of pursed lip breathing for management of symptoms and will use as needed Patient will state understanding of proper use of medications used for management of COPD including inhaler (albuterol) and nebulizer (duoneb) Patient will use medications as directed Patient will engage in light exercise as tolerated 3-5 days a week to aid in the the management of COPD Patient will use infection prevention strategies to reduce risk of respiratory infection Patient will state  understanding of the importance of adequate rest and management of fatigue with COPD Patient will continue to use O2 at 2 L per minute (or as directed by provider) per [place] via floor concentrator Provided by [facility] [phone]  Patient will monitor and record O2 levels with/without O2 and with exertion and at rest to see how he responds with and without oxygen. Take log to appt with Dr Vassie Loll on 05/04/23. Patient will call RN Care Coordinator [phone] with any care coordination or resource needs         SDOH assessments and interventions completed:  Yes  SDOH Interventions Today    Flowsheet Row Most Recent Value  SDOH Interventions   Transportation Interventions Intervention Not Indicated  Financial Strain Interventions Intervention Not Indicated        Care Coordination Interventions:  Yes, provided  Interventions Today    Flowsheet Row Most Recent Value  Chronic Disease   Chronic disease during today's visit Chronic Obstructive Pulmonary Disease (COPD), Congestive Heart Failure (CHF)  General Interventions   General Interventions Discussed/Reviewed General Interventions Discussed, General Interventions Reviewed, Doctor Visits, Durable Medical Equipment (DME)  Doctor Visits Discussed/Reviewed Doctor Visits Discussed, Doctor Visits Reviewed, PCP, Specialist  Durable Medical Equipment (DME) BP Cuff, Oxygen, Other  [Pulse Ox]  PCP/Specialist Visits Compliance with follow-up visit  Exercise Interventions   Exercise Discussed/Reviewed Exercise Discussed, Exercise Reviewed, Physical Activity  Physical Activity Discussed/Reviewed Physical Activity Discussed, Physical Activity Reviewed  Education Interventions   Education Provided Provided Education  Provided Verbal Education On When to see the doctor, Medication, Exercise  Pharmacy Interventions   Pharmacy Dicussed/Reviewed Medications and their functions       Follow up plan: Follow up call scheduled for 05/11/23     Encounter Outcome:  Pt. Visit Completed   Demetrios Loll, BSN, RN-BC RN Care Coordinator [facility]  [facility] Direct Dial: [phone] Main #: [phone]

## 2023-04-29 DIAGNOSIS — E1169 Type 2 diabetes mellitus with other specified complication: Secondary | ICD-10-CM | POA: Diagnosis not present

## 2023-04-29 DIAGNOSIS — I10 Essential (primary) hypertension: Secondary | ICD-10-CM | POA: Diagnosis not present

## 2023-04-29 DIAGNOSIS — Z0001 Encounter for general adult medical examination with abnormal findings: Secondary | ICD-10-CM | POA: Diagnosis not present

## 2023-04-29 DIAGNOSIS — M10071 Idiopathic gout, right ankle and foot: Secondary | ICD-10-CM | POA: Diagnosis not present

## 2023-04-29 DIAGNOSIS — E782 Mixed hyperlipidemia: Secondary | ICD-10-CM | POA: Diagnosis not present

## 2023-04-29 DIAGNOSIS — M10072 Idiopathic gout, left ankle and foot: Secondary | ICD-10-CM | POA: Diagnosis not present

## 2023-04-29 DIAGNOSIS — E1151 Type 2 diabetes mellitus with diabetic peripheral angiopathy without gangrene: Secondary | ICD-10-CM | POA: Diagnosis not present

## 2023-04-29 DIAGNOSIS — E7849 Other hyperlipidemia: Secondary | ICD-10-CM | POA: Diagnosis not present

## 2023-04-29 DIAGNOSIS — D696 Thrombocytopenia, unspecified: Secondary | ICD-10-CM | POA: Diagnosis not present

## 2023-04-29 DIAGNOSIS — E039 Hypothyroidism, unspecified: Secondary | ICD-10-CM | POA: Diagnosis not present

## 2023-05-01 DIAGNOSIS — J9601 Acute respiratory failure with hypoxia: Secondary | ICD-10-CM | POA: Diagnosis not present

## 2023-05-01 DIAGNOSIS — I509 Heart failure, unspecified: Secondary | ICD-10-CM | POA: Diagnosis not present

## 2023-05-01 DIAGNOSIS — J4489 Other specified chronic obstructive pulmonary disease: Secondary | ICD-10-CM | POA: Diagnosis not present

## 2023-05-01 DIAGNOSIS — E872 Acidosis, unspecified: Secondary | ICD-10-CM | POA: Diagnosis not present

## 2023-05-01 DIAGNOSIS — I10 Essential (primary) hypertension: Secondary | ICD-10-CM | POA: Diagnosis not present

## 2023-05-01 DIAGNOSIS — E039 Hypothyroidism, unspecified: Secondary | ICD-10-CM | POA: Diagnosis not present

## 2023-05-01 DIAGNOSIS — E1151 Type 2 diabetes mellitus with diabetic peripheral angiopathy without gangrene: Secondary | ICD-10-CM | POA: Diagnosis not present

## 2023-05-01 DIAGNOSIS — I25119 Atherosclerotic heart disease of native coronary artery with unspecified angina pectoris: Secondary | ICD-10-CM | POA: Diagnosis not present

## 2023-05-01 DIAGNOSIS — J441 Chronic obstructive pulmonary disease with (acute) exacerbation: Secondary | ICD-10-CM | POA: Diagnosis not present

## 2023-05-02 DIAGNOSIS — J4489 Other specified chronic obstructive pulmonary disease: Secondary | ICD-10-CM | POA: Diagnosis not present

## 2023-05-02 DIAGNOSIS — I5033 Acute on chronic diastolic (congestive) heart failure: Secondary | ICD-10-CM | POA: Diagnosis not present

## 2023-05-02 DIAGNOSIS — J9601 Acute respiratory failure with hypoxia: Secondary | ICD-10-CM | POA: Diagnosis not present

## 2023-05-02 DIAGNOSIS — E119 Type 2 diabetes mellitus without complications: Secondary | ICD-10-CM | POA: Diagnosis not present

## 2023-05-03 ENCOUNTER — Telehealth (HOSPITAL_BASED_OUTPATIENT_CLINIC_OR_DEPARTMENT_OTHER): Payer: Self-pay | Admitting: Pulmonary Disease

## 2023-05-03 NOTE — Telephone Encounter (Signed)
RA, please advise.  

## 2023-05-03 NOTE — Telephone Encounter (Signed)
Patient states he is not longer using oxygen, has been in the mid 90s without. Patient wants to know what Christopher Randolph would prefer on the at home breathing treatments if it would mess up PFT results. Please advise and call patient back.

## 2023-05-04 ENCOUNTER — Inpatient Hospital Stay (HOSPITAL_BASED_OUTPATIENT_CLINIC_OR_DEPARTMENT_OTHER): Payer: Medicare Other | Admitting: Pulmonary Disease

## 2023-05-06 NOTE — Telephone Encounter (Signed)
Called and spoke with patient. Patient verbalized understanding. Nothing further needed.  

## 2023-05-11 ENCOUNTER — Ambulatory Visit: Payer: Self-pay | Admitting: *Deleted

## 2023-05-11 ENCOUNTER — Encounter: Payer: Self-pay | Admitting: *Deleted

## 2023-05-11 DIAGNOSIS — D692 Other nonthrombocytopenic purpura: Secondary | ICD-10-CM | POA: Diagnosis not present

## 2023-05-11 DIAGNOSIS — I25119 Atherosclerotic heart disease of native coronary artery with unspecified angina pectoris: Secondary | ICD-10-CM | POA: Diagnosis not present

## 2023-05-11 DIAGNOSIS — E7849 Other hyperlipidemia: Secondary | ICD-10-CM | POA: Diagnosis not present

## 2023-05-11 DIAGNOSIS — Z0001 Encounter for general adult medical examination with abnormal findings: Secondary | ICD-10-CM | POA: Diagnosis not present

## 2023-05-11 DIAGNOSIS — Z1389 Encounter for screening for other disorder: Secondary | ICD-10-CM | POA: Diagnosis not present

## 2023-05-11 DIAGNOSIS — Z23 Encounter for immunization: Secondary | ICD-10-CM | POA: Diagnosis not present

## 2023-05-11 DIAGNOSIS — I10 Essential (primary) hypertension: Secondary | ICD-10-CM | POA: Diagnosis not present

## 2023-05-11 DIAGNOSIS — I700 Atherosclerosis of aorta: Secondary | ICD-10-CM | POA: Diagnosis not present

## 2023-05-11 DIAGNOSIS — E1151 Type 2 diabetes mellitus with diabetic peripheral angiopathy without gangrene: Secondary | ICD-10-CM | POA: Diagnosis not present

## 2023-05-11 DIAGNOSIS — D696 Thrombocytopenia, unspecified: Secondary | ICD-10-CM | POA: Diagnosis not present

## 2023-05-11 DIAGNOSIS — M109 Gout, unspecified: Secondary | ICD-10-CM | POA: Diagnosis not present

## 2023-05-11 NOTE — Patient Outreach (Signed)
Care Coordination   Follow Up Visit Note   05/11/2023 Name: Christopher Randolph MRN: [id number] DOB: 06-22-1946  Christopher Randolph is a 77 y.o. year old male who sees Burdine, Ananias Pilgrim, MD for primary care. I spoke with  Christopher Randolph by phone today.  What matters to the patients health and wellness today?  Managing COPD and CHF and having oxygen picked up    Goals Addressed             This Visit's Progress    Manage CHF   On track    Care Coordination Goals: Patient will monitor and record weights each morning after urinating Patient will call cardiologist with any weight gain of >2 lbs overnight or >5lbs in 1 week Patient will follow a low sodium, heart healthy diet Patient will reach out to cardiologist or PCP with any new or worsening symptoms Patient will monitor and record blood pressure daily and will reach out to provider with any readings outside of recommended range Patient will take medications as prescribed Patient will keep follow-up appointments Schedule appt in 6 months to f/u with cardio Patient will reach out to RN Care Coordinator [phone] with any care coordination or resource needs      Manage COPD   On track    Care Coordination Goals: Patient will keep all follow-up PCP and/or pulmonary appointments Saw PCP yesterday Has f/u appt with pulmonary scheduled for 6/3 PFTs scheduled for 6/4 Patient will call provider with any new or worsening symptoms Patient will track and manage COPD triggers Patient will use medications as directed Patient will engage in light exercise as tolerated 3-5 days a week to aid in the the management of COPD Patient will use infection prevention strategies to reduce risk of respiratory infection Patient will state understanding of the importance of adequate rest and management of fatigue with COPD Patient will monitor O2 levels with pulse ox as needed Patient will call RN Care Coordinator [phone] with any care  coordination or resource needs         SDOH assessments and interventions completed:  Yes  SDOH Interventions Today    Flowsheet Row Most Recent Value  SDOH Interventions   Transportation Interventions Intervention Not Indicated  Financial Strain Interventions Intervention Not Indicated        Care Coordination Interventions:  Yes, provided  Interventions Today    Flowsheet Row Most Recent Value  Chronic Disease   Chronic disease during today's visit Chronic Obstructive Pulmonary Disease (COPD), Congestive Heart Failure (CHF)  General Interventions   General Interventions Discussed/Reviewed General Interventions Discussed, General Interventions Reviewed, Durable Medical Equipment (DME), Communication with, Doctor Visits  Durable Medical Equipment (DME) BP Cuff  [scales, pulse ox, oxygen]  Communication with PCP/Specialists  [Dr Vassie Loll (pulmonary) re: patient's request to send d/c order for O2 to [facility]. He no longer needs oxygen and is doing well without it.]  Exercise Interventions   Exercise Discussed/Reviewed Physical Activity  Physical Activity Discussed/Reviewed Physical Activity Discussed, Physical Activity Reviewed  Education Interventions   Education Provided Provided Education  Provided Verbal Education On Medication, When to see the doctor, Mental Health/Coping with Illness, Other  [daily weight checks, BP monitoring, having oxygen picked up by [facility]]  Pharmacy Interventions   Pharmacy Dicussed/Reviewed Medications and their functions, Pharmacy Topics Discussed, Pharmacy Topics Reviewed       Follow up plan: Follow up call scheduled for 06/22/23    Encounter Outcome:  Pt. Visit Completed   Demetrios Loll,  BSN, RN-BC RN Care Coordinator [place] Direct Dial: [phone] Main #: [phone]

## 2023-05-12 ENCOUNTER — Telehealth: Payer: Self-pay | Admitting: Pulmonary Disease

## 2023-05-12 DIAGNOSIS — J4489 Other specified chronic obstructive pulmonary disease: Secondary | ICD-10-CM

## 2023-05-12 NOTE — Telephone Encounter (Signed)
-----   Message from Gwenith Daily, RN sent at 05/11/2023  1:21 PM EDT ----- Regarding: order to d/c oxygen Dr Vassie Randolph,  Christopher Randolph is doing well without oxygen and is requesting that a d/c order be sent to [facility] so that they will come and pick it up. His next f/u appt with you is on 05/31/23.  Please let me know if I can be of any assistance.   Thank you,  Christopher Randolph, BSN, RN-BC RN Care Coordinator [facility]  [facility] Direct Dial: [phone] Main #: [phone]

## 2023-05-12 NOTE — Telephone Encounter (Signed)
Okay to send order to [facility] oxygen

## 2023-05-21 NOTE — Telephone Encounter (Signed)
ATC pt LVM letting him know that I have placed an order per RA to d/u his oxygen. Instructed pt to call back w/ any other concerns

## 2023-05-28 DIAGNOSIS — I5032 Chronic diastolic (congestive) heart failure: Secondary | ICD-10-CM | POA: Diagnosis not present

## 2023-05-28 DIAGNOSIS — I25119 Atherosclerotic heart disease of native coronary artery with unspecified angina pectoris: Secondary | ICD-10-CM | POA: Diagnosis not present

## 2023-05-28 DIAGNOSIS — J441 Chronic obstructive pulmonary disease with (acute) exacerbation: Secondary | ICD-10-CM | POA: Diagnosis not present

## 2023-05-31 ENCOUNTER — Encounter (HOSPITAL_BASED_OUTPATIENT_CLINIC_OR_DEPARTMENT_OTHER): Payer: Self-pay | Admitting: Pulmonary Disease

## 2023-05-31 ENCOUNTER — Ambulatory Visit (HOSPITAL_BASED_OUTPATIENT_CLINIC_OR_DEPARTMENT_OTHER): Payer: Medicare Other | Admitting: Pulmonary Disease

## 2023-05-31 VITALS — BP 120/60 | HR 61 | Temp 98.1°F | Ht 68.0 in | Wt 172.0 lb

## 2023-05-31 DIAGNOSIS — J439 Emphysema, unspecified: Secondary | ICD-10-CM | POA: Diagnosis not present

## 2023-05-31 DIAGNOSIS — J4489 Other specified chronic obstructive pulmonary disease: Secondary | ICD-10-CM | POA: Diagnosis not present

## 2023-05-31 DIAGNOSIS — J9611 Chronic respiratory failure with hypoxia: Secondary | ICD-10-CM | POA: Diagnosis not present

## 2023-05-31 MED ORDER — TRELEGY ELLIPTA 100-62.5-25 MCG/ACT IN AEPB: 1.0000 | INHALATION_SPRAY | Freq: Every day | RESPIRATORY_TRACT | 0 refills | Status: DC

## 2023-05-31 NOTE — Patient Instructions (Signed)
X ambulatory saturation  x sample of Trelegy 100 -rinse mouth after use, we will advise you to start this after PFTs

## 2023-05-31 NOTE — Assessment & Plan Note (Addendum)
He has returned his oxygen and feels back to baseline. His lung function was to be borderline for him to require oxygen with a flareup. It is likely he may require oxygen again in the future We can consider pulmonary rehab program in the future if he is willing to participate

## 2023-05-31 NOTE — Progress Notes (Signed)
   Subjective:    Patient ID: Christopher Randolph, male    DOB: August 04, 1946, 77 y.o.   MRN: [id number]  HPI  78  yo  heavy ex-smoker from [facility] for FU of COPD and chronic hypoxic respiratory failure.   He smoked for more than 80 pack years before he quit in 2004   PMH -CAD status post stent 2004 Hypertension, diabetes type 2 TIA 02/2022 amaurosis fugax ,right carotid stent   Initial OV 04/2022 >> adm x 2 , second admission was for acute diastolic heart failure and fluid retention,  improved with diuresis but hypoxia persisted. He was able to stay off oxygen for short periods at home, lowest desat was 88% after ambulation.  Oxygen was subsequently discontinued   Meds - Breztri/ albuterol made him jittery  46-month follow-up visit. He was hospitalized end of March for rhinovirus infection and COPD exacerbation, his wife was sick and hospitalized at the same time.  CT angiogram was negative for pulm embolism showed right lower lobe airspace disease, initial lactate was 4.  He was discharged on oxygen but came off of it within a few weeks. He had ED visit on 4/6 with chest x-ray showing patchy bibasilar opacities but this seems to have been due to inadequate therapy from the initial hospitalization. He was provided a nebulizer on discharge and uses albuterol and Atrovent occasionally.  He feels like he is back to his baseline now He has been rescheduled for tomorrow  On ambulation oxygen saturation dropped to 89% on walking 3 laps  Significant tests/ events reviewed PFTs 04/2022 showed moderate airway obstruction with ratio 70, FEV1 62%, FVC 64 %, postbronchodilator FEV1 67%, DLCO 70%   CT angiogram chest 03/2022 right lower lobe atelectasis/effusion  Review of Systems neg for any significant sore throat, dysphagia, itching, sneezing, nasal congestion or excess/ purulent secretions, fever, chills, sweats, unintended wt loss, pleuritic or exertional cp, hempoptysis, orthopnea pnd or change in chronic  leg swelling. Also denies presyncope, palpitations, heartburn, abdominal pain, nausea, vomiting, diarrhea or change in bowel or urinary habits, dysuria,hematuria, rash, arthralgias, visual complaints, headache, numbness weakness or ataxia.     Objective:   Physical Exam  Gen. Pleasant, obese, in no distress ENT - no lesions, no post nasal drip Neck: No JVD, no thyromegaly, no carotid bruits Lungs: no use of accessory muscles, no dullness to percussion, decreased without rales or rhonchi  Cardiovascular: Rhythm regular, heart sounds  normal, no murmurs or gallops, no peripheral edema Musculoskeletal: No deformities, no cyanosis or clubbing , no tremors       Assessment & Plan:

## 2023-05-31 NOTE — Assessment & Plan Note (Signed)
I feel lung function would be worse than 62%.  He got severely ill with rhinovirus infection 2 months ago. We will await repeat PFTs tomorrow but meantime I have asked him to start on a sample of Trelegy He will use albuterol nebs for rescue

## 2023-06-01 ENCOUNTER — Other Ambulatory Visit: Payer: Self-pay | Admitting: Cardiology

## 2023-06-01 ENCOUNTER — Ambulatory Visit (HOSPITAL_COMMUNITY)
Admission: RE | Admit: 2023-06-01 | Discharge: 2023-06-01 | Disposition: A | Discharge status: Home / Self Care | Payer: Medicare Other | Source: Ambulatory Visit | Attending: Pulmonary Disease | Admitting: Pulmonary Disease

## 2023-06-01 DIAGNOSIS — J439 Emphysema, unspecified: Secondary | ICD-10-CM

## 2023-06-01 DIAGNOSIS — J4489 Other specified chronic obstructive pulmonary disease: Secondary | ICD-10-CM | POA: Diagnosis not present

## 2023-06-01 LAB — PULMONARY FUNCTION TEST
DL/VA % pred: 99 %
DL/VA: 3.98 ml/min/mmHg/L
DLCO unc % pred: 77 %
DLCO unc: 17.08 ml/min/mmHg
FEF 25-75 Post: 1.47 L/sec
FEF 25-75 Pre: 0.69 L/sec
FEF2575-%Change-Post: 113 %
FEF2575-%Pred-Post: 77 %
FEF2575-%Pred-Pre: 36 %
FEV1-%Change-Post: 15 %
FEV1-%Pred-Post: 69 %
FEV1-%Pred-Pre: 60 %
FEV1-Post: 1.82 L
FEV1-Pre: 1.58 L
FEV1FVC-%Change-Post: 1 %
FEV1FVC-%Pred-Pre: 92 %
FEV6-%Change-Post: 15 %
FEV6-%Pred-Post: 75 %
FEV6-%Pred-Pre: 65 %
FEV6-Post: 2.55 L
FEV6-Pre: 2.2 L
FEV6FVC-%Change-Post: 1 %
FEV6FVC-%Pred-Post: 101 %
FEV6FVC-%Pred-Pre: 100 %
FVC-%Change-Post: 13 %
FVC-%Pred-Post: 74 %
FVC-%Pred-Pre: 64 %
FVC-Post: 2.68 L
FVC-Pre: 2.35 L
Post FEV1/FVC ratio: 68 %
Post FEV6/FVC ratio: 95 %
Pre FEV1/FVC ratio: 67 %
Pre FEV6/FVC Ratio: 94 %
RV % pred: 132 %
RV: 3.06 L
TLC % pred: 89 %
TLC: 5.56 L

## 2023-06-01 MED ORDER — ALBUTEROL SULFATE (2.5 MG/3ML) 0.083% IN NEBU
2.5000 mg | INHALATION_SOLUTION | Freq: Once | RESPIRATORY_TRACT | Status: AC
  Administered 2023-06-01: 2.5 mg via RESPIRATORY_TRACT

## 2023-06-22 ENCOUNTER — Ambulatory Visit: Payer: Self-pay | Admitting: *Deleted

## 2023-06-22 NOTE — Patient Outreach (Signed)
  Care Coordination   06/22/2023 Name: Christopher Randolph MRN: [id number] DOB: 02/16/1946   Care Coordination Outreach Attempts:  An unsuccessful telephone outreach was attempted for a scheduled appointment today.  Follow Up Plan:  Additional outreach attempts will be made to offer the patient care coordination information and services.   Encounter Outcome:  No Answer. Left HIPAA compliant VM.   Care Coordination Interventions:  No, not indicated    Demetrios Loll, BSN, RN-BC RN Care Coordinator [facility]  [facility] Direct Dial: [phone] Main #: [phone]

## 2023-06-28 DIAGNOSIS — R3912 Poor urinary stream: Secondary | ICD-10-CM | POA: Diagnosis not present

## 2023-07-02 ENCOUNTER — Telehealth: Payer: Self-pay | Admitting: *Deleted

## 2023-07-02 NOTE — Progress Notes (Signed)
  Care Coordination Note  07/02/2023 Name: CHIRON UMSTED MRN: [id number] DOB: [birth year]  [name] Lathem is a 77 y.o. year old male who is a primary care patient of Burdine, Ananias Pilgrim, MD and is actively engaged with the care management team. [name] reached out to care guide by phone today to assist with re-scheduling a follow up visit with the RN Case Manager  Follow up plan: Telephone appointment with care management team member scheduled for:07/12/23  [facility] Coordination Care Guide  Direct Dial: [phone]

## 2023-07-02 NOTE — Progress Notes (Signed)
  Care Coordination Note  07/02/2023 Name: Christopher Randolph MRN: [id number] DOB: 25-Aug-1946  [name] is a 77 y.o. year old male who is a primary care patient of Burdine, Ananias Pilgrim, MD and is actively engaged with the care management team. I reached out to [name] by phone today to assist with re-scheduling a follow up visit with the RN Case Manager  Follow up plan: Unsuccessful telephone outreach attempt made. A HIPAA compliant phone message was left for the patient providing contact information and requesting a return call.   [facility]  Care Coordination Care Guide  Direct Dial: [phone]

## 2023-07-12 ENCOUNTER — Ambulatory Visit: Payer: Self-pay | Admitting: *Deleted

## 2023-07-12 ENCOUNTER — Encounter: Payer: Self-pay | Admitting: *Deleted

## 2023-07-12 DIAGNOSIS — J4489 Other specified chronic obstructive pulmonary disease: Secondary | ICD-10-CM

## 2023-07-12 DIAGNOSIS — J439 Emphysema, unspecified: Secondary | ICD-10-CM

## 2023-07-12 NOTE — Patient Outreach (Signed)
Care Coordination   Follow Up Visit Note   07/12/2023 Name: Christopher Randolph MRN: [id number] DOB: 1946/10/09  Christopher Randolph is a 77 y.o. year old male who sees Christopher Randolph, Christopher Pilgrim, MD for primary care. I spoke with  [name] by phone today.  What matters to the patients health and wellness today?  Results of pulmonary function tests, should he be using Trelegy, pain in tendon left antecubital area, managing medication costs.   History of bicep tendon rupture and had surgery years ago. Experiencing burning pain and discomfort for the past 2-3 weeks. No recent injury. It is not tender to the touch.     Goals Addressed             This Visit's Progress    Manage Blood Sugar   On track    Care Coordination Goals: Patient will follow-up with PCP every 3 months or as recommended Patient will take medication as prescribed and reach out to provider with any negative side effects Patient will talk with Pharmacy Team re: prescription assistance for Marcelline Deist Had been receiving Patient assistance through [facility] but it was d/c after an internal review on their end. Copay is $45 per month now but anticipates hitting the Medicare Coverage Gap soon Patient will continue to monitor and record blood sugar daily and as needed with glucometer, and will call PCP with any readings outside of recommended range Patient will take blood sugar log and meter to provider visits for review Patient will reach out to RN Care Coordinator [phone] with any care coordination or resource needs      Manage CHF   On track    Care Coordination Goals: Patient will monitor and record weights each morning after urinating Patient will call cardiologist with any weight gain of >3 lbs overnight or >5lbs in 1 week Patient will follow a low sodium, heart healthy diet Patient will maintain fluid restriction of 64 oz per day Patient will reach out to cardiologist or PCP with any new or worsening symptoms Patient  will monitor and record blood pressure daily and will reach out to provider with any readings outside of recommended range Patient will take medications as prescribed Takes furosemide every other day and per cardio, can take an additional furosemide if weight is >2 lbs overnight or >5 lbs in one week Patient will keep all scheduled medical appointments Patient will reach out to RN Care Coordinator [phone] with any care coordination or resource needs      Manage COPD   On track    Care Coordination Goals: Patient will keep all follow-up PCP and/or pulmonary appointments Patient will follow-up with pulmonary re: results of PFTs Patient will talk with pulmonary re: medication administration instructions Has sample of Trelegy but has not been using it. Uses Duoneb daily for maintenance. Trelegy is for daily maintenance and Duoneb and albuterol should be used as needed for SOB that does not resolve with rest Patient will call provider with any new or worsening symptoms Patient will track and manage COPD triggers Some SOB with activity be resolves within minutes of resting without use of rescue inhaler or nebulizer Patient will remain active and engage in light exercise as tolerated 3-5 days a week to aid in the the management of COPD Patient will use Action Zones to manage COPD symptoms  Refer to handout provided and instructions given by pulmonologist Patient will monitor O2 levels with pulse ox as needed Patient will call RN Care  Coordinator [phone] with any care coordination or resource needs      Resolve Left Antecubital Pain       Care Coordination Goals: Patient will use voltaren gel on area as directed by package insert Patient will try ice or heat for 20 min at a time for symptom relieve Patient will review and use handout on bicep tendonitis and at home physical therapy exercises Patient can use a bicep tendonitis brace as needed for symptom management and during activity.  Prolonged use is not recommended. Patient will follow-up with provider if pain persists or worsens or if new symptoms develop Patient will reach out to RN Care Coordinator at [phone] with any resource or care coordination needs        SDOH assessments and interventions completed:  Yes  SDOH Interventions Today    Flowsheet Row Most Recent Value  SDOH Interventions   Transportation Interventions Intervention Not Indicated  Physical Activity Interventions Intervention Not Indicated        Care Coordination Interventions:  Yes, provided  Interventions Today    Flowsheet Row Most Recent Value  Chronic Disease   Chronic disease during today's visit Congestive Heart Failure (CHF), Diabetes, Chronic Obstructive Pulmonary Disease (COPD)  General Interventions   General Interventions Discussed/Reviewed General Interventions Discussed, General Interventions Reviewed, Durable Medical Equipment (DME), Communication with  [Wt is stable and stays within 2 lbs, 167.6 today (highest 169). Yesterday BP 140/67 & today 124/70. Blood sugar yesterday 150 & today 131. O2 97% yesterday. Lowest over past couple of weeks has been 93%.]  Durable Medical Equipment (DME) BP Cuff, Glucomoter, Other  [Scales, pulse ox. No longer uses oxygen.]  Communication with PCP/Specialists  [[facility] re: results of PFT done in June and clarify usage of Trelegy for maintenance and duoneb & albuterol PRN only.]  Exercise Interventions   Exercise Discussed/Reviewed Exercise Discussed, Exercise Reviewed, Physical Activity  Physical Activity Discussed/Reviewed Physical Activity Discussed, Physical Activity Reviewed  [patient stays very active daily around his home and yard. He does not like to sit around. Encouraged to avoid being out during the heat of the day. He does have some SOB with exertion but it resolves without treatment after resting for a few minutes.]  Education Interventions   Education Provided  Provided Printed Education, Provided Education  [Printed materials to be mailed: 1.COPD Action Zone  2.Distal Biceps Tendinitis]  Provided Verbal Education On When to see the doctor, Medication, Blood Sugar Monitoring, Labs, Other, Nutrition  [Left antecubital tendon pain: Ice and/or heat 20 min, voltaren gel, f/u with provider if persists or worsens]  Labs Reviewed Hgb A1c  [03/28/23 A1C 6.6]  Nutrition Interventions   Nutrition Discussed/Reviewed Nutrition Discussed, Decreasing salt, Fluid intake  [fluid restriction of 64 oz per day]  Pharmacy Interventions   Pharmacy Dicussed/Reviewed Pharmacy Topics Discussed, Medications and their functions, Pharmacy Topics Reviewed, Affording Medications, Referral to Pharmacist  [Has sample of Trelegy but not using. Using Duoneb daily for maintenance and albuterol inh PRN. Recommended saline nasal spray prior to Flonase for nasal congestion symptoms and to moisture nasal passages.]  Referral to Pharmacist Cannot afford medications  [Prolenda and Xalatan Eye drops & Marcelline Deist are expensive. He was getting patient assistance through [employer] for Marcelline Deist, but they reviewed his info again and cancelled it. Trelegy will be expensive also if he has to purchase that.]       Follow up plan: Follow up call scheduled for 08/12/23    Encounter Outcome:  Pt. Visit Completed  Demetrios Loll, BSN, RN-BC RN Care Coordinator [facility]  [facility] Direct Dial: [phone] Main #: [phone]

## 2023-07-19 DIAGNOSIS — H02831 Dermatochalasis of right upper eyelid: Secondary | ICD-10-CM | POA: Diagnosis not present

## 2023-07-19 DIAGNOSIS — H57813 Brow ptosis, bilateral: Secondary | ICD-10-CM | POA: Diagnosis not present

## 2023-07-19 DIAGNOSIS — H02834 Dermatochalasis of left upper eyelid: Secondary | ICD-10-CM | POA: Diagnosis not present

## 2023-07-21 NOTE — Progress Notes (Signed)
[facility] - Clinic Note  08/03/2023     CHIEF COMPLAINT Patient presents for Retina Follow Up  HISTORY OF PRESENT ILLNESS: Christopher Randolph is a 77 y.o. male who presents to the clinic today for:   HPI     Retina Follow Up   Patient presents with  Other.  In left eye.  This started 6 months ago.  I, the attending physician,  performed the HPI with the patient and updated documentation appropriately.        Comments   Patient here for 6 months retina follow up for CME OS. Patient states vision doing so, so. Saw Dr. Dione Booze a month ago. Only slight change in glasses RX. Not enough to change glasses. Still using drops.      Last edited by Rennis Chris, MD on 08/03/2023  4:49 PM.    Pt states he is using PF and ketorolac in the morning and latanoprost at night, he saw Dr. Dione Booze about a month ago, his new glasses rx did not change enough to get new glasses  Referring physician: Juliette Alcide, MD [address] [place],  [place] [address]  HISTORICAL INFORMATION:   Selected notes from the MEDICAL RECORD NUMBER Referred by Dr. Monica Becton for concern of vitreous hemorrhage   CURRENT MEDICATIONS: Current Outpatient Medications (Ophthalmic Drugs)  Medication Sig   Bromfenac Sodium (PROLENSA) 0.07 % SOLN Place 1 drop into the left eye daily.   latanoprost (XALATAN) 0.005 % ophthalmic solution Place 1 drop into the left eye at bedtime.   prednisoLONE acetate (PRED FORTE) 1 % ophthalmic suspension Place 1 drop into the left eye daily.   No current facility-administered medications for this visit. (Ophthalmic Drugs)   Current Outpatient Medications (Other)  Medication Sig   acetaminophen (TYLENOL) 500 MG tablet Take 1,000 mg by mouth daily as needed for moderate pain, fever or headache.   albuterol (VENTOLIN HFA) 108 (90 Base) MCG/ACT inhaler Inhale 2 puffs into the lungs every 6 (six) hours as needed for wheezing or shortness of breath.   aspirin EC 81 MG tablet Take  81 mg by mouth daily.   atorvastatin (LIPITOR) 20 MG tablet TAKE 1 TABLET BY MOUTH DAILY   clopidogrel (PLAVIX) 75 MG tablet TAKE 1 TABLET BY MOUTH DAILY   dapagliflozin propanediol (FARXIGA) 10 MG TABS tablet Take 1 tablet (10 mg total) by mouth daily before breakfast.   Fluticasone-Umeclidin-Vilant (TRELEGY ELLIPTA) 100-62.5-25 MCG/ACT AEPB Inhale 1 puff into the lungs daily.   furosemide (LASIX) 20 MG tablet Take 1 tablet (20 mg total) by mouth every other day.   guaiFENesin-dextromethorphan (ROBITUSSIN DM) 100-10 MG/5ML syrup Take 5 mLs by mouth every 4 (four) hours as needed for cough (chest congestion).   ipratropium-albuterol (DUONEB) 0.5-2.5 (3) MG/3ML SOLN Take 3 mLs by nebulization every 6 (six) hours as needed.   levothyroxine (SYNTHROID) 175 MCG tablet Take 175 mcg by mouth See admin instructions. 175 mcg every 3rd day.   levothyroxine (SYNTHROID, LEVOTHROID) 150 MCG tablet Take 150 mcg by mouth See admin instructions. 150 mcg once daily for 2 days, every 3rd day - repeat.   lisinopril (ZESTRIL) 20 MG tablet Take 20 mg by mouth daily.   predniSONE (DELTASONE) 10 MG tablet Take 1 tablet (10 mg total) by mouth daily. Take 4 pills daily for 3 days then 2 pills daily for 3 days then 1 pill daily for 3 days then  stop   tamsulosin (FLOMAX) 0.4 MG CAPS capsule  Take 0.8 mg by mouth at bedtime.   No current facility-administered medications for this visit. (Other)   REVIEW OF SYSTEMS: ROS   Positive for: Musculoskeletal, Cardiovascular, Eyes Negative for: Constitutional, Gastrointestinal, Neurological, Skin, Genitourinary, HENT, Endocrine, Respiratory, Psychiatric, Allergic/Imm, Heme/Lymph Last edited by Laddie Aquas, COA on 08/03/2023  8:01 AM.     ALLERGIES Allergies  Allergen Reactions   Erythromycin Swelling    Lip swelling   Shellfish Allergy Rash and Other (See Comments)    Gout flares   Nitrofuran Derivatives Swelling   Nitrostat [Nitroglycerin] Other (See  Comments)    Severe hypotension   Zithromax [Azithromycin] Swelling   PAST MEDICAL HISTORY Past Medical History:  Diagnosis Date   AAA (abdominal aortic aneurysm) (HCC)    Arthritis    CAD (coronary artery disease)    Branch vessel and moderate RCA disease 2006   Cancer [facility]) 1990   Melanoma Lower right Leg   Carotid artery disease (HCC)    COPD (chronic obstructive pulmonary disease) (HCC)    Essential hypertension    Glaucoma    Hyperlipidemia    Hypothyroidism    Lyme disease    PAD (peripheral artery disease) (HCC)    Left common iliac stent 2004   Pneumonia    TIA (transient ischemic attack) 02/2022   Type 2 diabetes mellitus (HCC)    Wears dentures    Wears glasses    Past Surgical History:  Procedure Laterality Date   CATARACT EXTRACTION Bilateral    CATARACT EXTRACTION, BILATERAL     CERVICAL DISC SURGERY     x 1   COLONOSCOPY N/A 11/06/2016   Procedure: COLONOSCOPY;  Surgeon: Corbin Ade, MD;  [facility];  Service: Endoscopy;  Laterality: N/A;  7:30 AM   COLONOSCOPY  2012   Dr. Gabriel Cirri: normal. reviewed reports, which states he has a history of polyps in remote past.    ELBOW SURGERY Left    EYE SURGERY Bilateral    Cat Sx   EYE SURGERY Left 01/14/2021   [facility] - Dr. Arville Go   IRIDOTOMY / IRIDECTOMY Left 01/14/2021   [facility] - Dr. Arville Go   JOINT REPLACEMENT     LUMBAR WOUND DEBRIDEMENT N/A 01/24/2019   Procedure: LUMBAR WOUND Exploration for Evacation of Seroma vs. Hematoma;  Surgeon: Shirlean Kelly, MD;  Location: [facility] OR;  Service: Neurosurgery;  Laterality: N/A;   MELANOMA EXCISION     right leg, seen at [facility] and underwent immunotherapy   NECK SURGERY      x 1 yrs ago   PARS PLANA VITRECTOMY Left 04/04/2020   Procedure: PARS PLANA VITRECTOMY WITH 25G REMOVAL/SUTURE INTRAOCULAR LENS;  Surgeon: Rennis Chris, MD;  Location: [facility] OR;  Service: Ophthalmology;  Laterality: Left;   REPLACEMENT TOTAL KNEE Right    TRANSCAROTID ARTERY  REVASCULARIZATION  Right 03/25/2022   Procedure: Right Transcarotid Artery Revascularization;  Surgeon: Nada Libman, MD;  Location: [facility] OR;  Service: Vascular;  Laterality: Right;   WISDOM TOOTH EXTRACTION     FAMILY HISTORY Family History  Problem Relation Age of Onset   Heart disease Mother    Heart attack Mother    Colon polyps Mother    Colon polyps Brother    Heart attack Maternal Grandmother    Heart attack Maternal Grandfather    Colon cancer Neg Hx    SOCIAL HISTORY Social History   Tobacco Use   Smoking status: Former    Current packs/day: 0.00  Average packs/day: 1.5 packs/day for 40.0 years (60.0 ttl pk-yrs)    Types: Cigarettes    Start date: 11/04/1963    Quit date: 10/29/2003    Years since quitting: 19.7    Passive exposure: Never   Smokeless tobacco: Never  Vaping Use   Vaping status: Never Used  Substance Use Topics   Alcohol use: Yes    Alcohol/week: 15.0 standard drinks of alcohol    Types: 14 Cans of beer, 1 Standard drinks or equivalent per week    Comment: 2 beers or a mixed drink   Drug use: No       OPHTHALMIC EXAM:  Base Eye Exam     Visual Acuity (Snellen - Linear)       Right Left   Dist cc 20/20 20/25    Correction: Glasses         Tonometry (Tonopen, 7:59 AM)       Right Left   Pressure 12 19         Pupils       Dark Light Shape React APD   Right 3 2 Round Brisk None   Left 3 2 Round Brisk None         Visual Fields (Counting fingers)       Left Right    Full Full         Extraocular Movement       Right Left    Full, Ortho Full, Ortho         Neuro/Psych     Oriented x3: Yes   Mood/Affect: Normal         Dilation     Both eyes: 1.0% Mydriacyl, 2.5% Phenylephrine @ 7:59 AM           Slit Lamp and Fundus Exam     External Exam       Right Left   External brow ptosis brow ptosis; mild periorbital edema and erythema         Slit Lamp Exam       Right Left   Lids/Lashes  Dermatochalasis - upper lid, mild Meibomian gland dysfunction Dermatochalasis, Telangiectasia, Meibomian gland dysfunction, mild Ptosis   Conjunctiva/Sclera Nasal and temporal Pinguecula temporal Pinguecula, ahmed plate and scleral patch graft ST quad -- nicely healed, bleb ST conj   Cornea well healed cataract wound well healed cataract wound, trace tear film debris   Anterior Chamber deep and clear Deep, 0.5+ pigment, tube at 0100   Iris Round and moderately dilated, No NVI Round and dilated, No NVI, +Transillumination defects ST   Lens Posterior chamber intraocular lens in good position sutured Akreos IOL in good position   Anterior Vitreous mild vitreous syneresis post vitrectomy, clear         Fundus Exam       Right Left   Disc Pink and Sharp, Compact Pink and Sharp, Compact, mild temporal PPA   C/D Ratio 0.3 0.2   Macula Flat, good foveal reflex, mild RPE mottling, No heme or edema Flat. blunted foveal reflex, central cystic changes - stably improved, mild RPE mottling, No heme or edema   Vessels attenuated, mild tortuosity attenuated, mild tortuosity   Periphery Attached, round focal area of CR atrophy at 0630 mid zone Attached, No heme           Refraction     Wearing Rx       Sphere Cylinder Axis   Right +0.50 +1.00 178   Left -2.75 +2.50  175           IMAGING AND PROCEDURES  Imaging and Procedures for @TODAY @  OCT, Retina - OU - Both Eyes       Right Eye Quality was good. Central Foveal Thickness: 272. Progression has been stable. Findings include normal foveal contour, no IRF, no SRF (partial PVD - stable).   Left Eye Quality was good. Central Foveal Thickness: 283. Progression has been stable. Findings include normal foveal contour, no IRF, no SRF (stable resolution of central cystic changes / CME).   Notes *Images captured and stored on drive  Diagnosis / Impression:  OD: NFP, no IRF/SRF; partial PVD OS: NFP; no IRF/SRF - stable resolution of  central cystic changes / CME  Clinical management:  See below  Abbreviations: NFP - Normal foveal profile. CME - cystoid macular edema. PED - pigment epithelial detachment. IRF - intraretinal fluid. SRF - subretinal fluid. EZ - ellipsoid zone. ERM - epiretinal membrane. ORA - outer retinal atrophy. ORT - outer retinal tubulation. SRHM - subretinal hyper-reflective material            ASSESSMENT/PLAN:    ICD-10-CM   1. Uveitis-hyphema-glaucoma syndrome of left eye  T85.398A OCT, Retina - OU - Both Eyes   H20.9    H40.42X0     2. 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 Pigmentary glaucoma of left eye, mild stage  H40.1321     4. Dislocated IOL (intraocular lens), anterior, left  H27.122     5. Cystoid macular edema of left eye  H35.352 OCT, Retina - OU - Both Eyes    6. Pseudophakia of both eyes  Z96.1     7. Lyme disease  A69.20     8. Dermatochalasis of both upper eyelids  H02.831    H02.834     9. Brow ptosis  H57.819      1-4. UGH Syndrome OS  - originally: 3 piece PCIOL OS centered, but iris has large TID in sup temp quadrant with IOL haptic within area  - tmax at [facility] 39 OS and was started on max drops (Cosopt, Brim, latan, and po diamox)  - repeat gonio showed open angles OS, mild focal PAS  - s/p PPV w/ IOL exchange / sutured secondary Akreos OS IOL 04.08.21             - today, doing well, BCVA OS 20/25   - OCT shows stable resolution of CME/IRF             - IOP 20 OS  - s/p Ahmed Valve OS on 1.18.22 ([place])  - now released from Dr. Elnora Morrison care  - monitor   5. CME OS -- recurrent, but currently resolved  - mild CME that was improved on 7.21.21  - interval re-development of CME  06.21.23 (prior recurrences 08.18.21 and 12.16.22)  - s/p STK OS (08.18.21)  - OCT today (09.06.23) shows stable resolution of central cystic changes / CME  - BCVA stable at 20/25   - cont PF and Prolensa QDaily OS  - f/u 9 months DFE, OCT  6. Pseudophakia OU  - s/p CE/IOL OU  -- pt can't remember dates, one eye by [facility], other eye by [facility]  - h/o UGH OS and now sutured IOL as above  - PCIOL OD appears to be in good position  - sutured Akreos IOL OS in good position  - monitor  7. Lyme disease  - history of  diagnosis from target lesions on lower extremities  - no blood test confirmation performed   - completed doxycycline per PCP  - no manifestations in the eye, but will continue to monitor  8,9. Brow ptosis, dermatochalasis OU  - referred to Dr. Ether Griffins (now at [facility]) for eyelid eval  - holding on repair for now  Ophthalmic Meds Ordered this visit:  No orders of the defined types were placed in this encounter.    Return in about 9 months (around 05/02/2024) for f/u CME OS, DFE, OCT.  There are no Patient Instructions on file for this visit. This document serves as a record of services personally performed by Karie Chimera, MD, PhD. It was created on their behalf by Gerilyn Nestle, COT an ophthalmic technician. The creation of this record is the provider's dictation and/or activities during the visit.    Electronically signed by:  Charlette Caffey, COT  08/03/23 4:50 PM  This document serves as a record of services personally performed by Karie Chimera, MD, PhD. It was created on their behalf by Glee Arvin. Manson Passey, OA an ophthalmic technician. The creation of this record is the provider's dictation and/or activities during the visit.    Electronically signed by: Glee Arvin. Manson Passey, OA 08/03/23 4:50 PM  Karie Chimera, M.D., Ph.D. Diseases & Surgery of the Retina and Vitreous [facility]  I have reviewed the above documentation for accuracy and completeness, and I agree with the above. Karie Chimera, M.D., Ph.D. 08/03/23 4:52 PM   Abbreviations: M myopia (nearsighted); A astigmatism; H hyperopia (farsighted); P presbyopia; Mrx spectacle prescription;  CTL contact lenses; OD right eye; OS left eye; OU both eyes   XT exotropia; ET esotropia; PEK punctate epithelial keratitis; PEE punctate epithelial erosions; DES dry eye syndrome; MGD meibomian gland dysfunction; ATs artificial tears; PFAT's preservative free artificial tears; NSC nuclear sclerotic cataract; PSC posterior subcapsular cataract; ERM epi-retinal membrane; PVD posterior vitreous detachment; RD retinal detachment; DM diabetes mellitus; DR diabetic retinopathy; NPDR non-proliferative diabetic retinopathy; PDR proliferative diabetic retinopathy; CSME clinically significant macular edema; DME diabetic macular edema; dbh dot blot hemorrhages; CWS cotton wool spot; POAG primary open angle glaucoma; C/D cup-to-disc ratio; HVF humphrey visual field; GVF goldmann visual field; OCT optical coherence tomography; IOP intraocular pressure; BRVO Branch retinal vein occlusion; CRVO central retinal vein occlusion; CRAO central retinal artery occlusion; BRAO branch retinal artery occlusion; RT retinal tear; SB scleral buckle; PPV pars plana vitrectomy; VH Vitreous hemorrhage; PRP panretinal laser photocoagulation; IVK intravitreal kenalog; VMT vitreomacular traction; MH Macular hole;  NVD neovascularization of the disc; NVE neovascularization elsewhere; AREDS age related eye disease study; ARMD age related macular degeneration; POAG primary open angle glaucoma; EBMD epithelial/anterior basement membrane dystrophy; ACIOL anterior chamber intraocular lens; IOL intraocular lens; PCIOL posterior chamber intraocular lens; Phaco/IOL phacoemulsification with intraocular lens placement; PRK photorefractive keratectomy; LASIK laser assisted in situ keratomileusis; HTN hypertension; DM diabetes mellitus; COPD chronic obstructive pulmonary disease

## 2023-07-26 DIAGNOSIS — E1151 Type 2 diabetes mellitus with diabetic peripheral angiopathy without gangrene: Secondary | ICD-10-CM | POA: Diagnosis not present

## 2023-07-26 DIAGNOSIS — M48061 Spinal stenosis, lumbar region without neurogenic claudication: Secondary | ICD-10-CM | POA: Diagnosis not present

## 2023-07-28 ENCOUNTER — Other Ambulatory Visit (HOSPITAL_BASED_OUTPATIENT_CLINIC_OR_DEPARTMENT_OTHER): Payer: Self-pay

## 2023-07-28 ENCOUNTER — Telehealth (HOSPITAL_BASED_OUTPATIENT_CLINIC_OR_DEPARTMENT_OTHER): Payer: Self-pay

## 2023-07-28 NOTE — Telephone Encounter (Signed)
-----   Message from Nurse Tommy Rainwater sent at 07/12/2023 10:38 AM EDT ----- Regarding: Resulst of PFTs and Med Management Good Morning,  I spoke with Mr Robards today. He has not been given the results of his PFTs from last month. Looking at Dr Reginia Naas notes, patient was to start Trelegy, but he has not. His understanding was that he would be instructed after having PFTs if he needed to start Trelegy. He does have a sample inhaler, but is concerned about ongoing cost if he needs to use that daily. He is using duoneb daily for maintenance instead of rescue. He is not SOB often and it is with activity and resolves with rest without use of albuterol or duoneb.   Can you review and reach out to patient with results and medication instructions? Can you let me know that you have received this message so that I can close the loop on my end?  Thank you and please let me know if can be of any assistance.   Demetrios Loll, BSN, RN-BC RN Care Coordinator [facility]  [facility] Direct Dial: [phone] Main #: [phone]

## 2023-07-28 NOTE — Telephone Encounter (Signed)
Spoke with patient in regards to his Pft results. Patient states understanding. He states that he cannot afford the trelegy as he will be in the donut hole soon with insurance. He states that bretzi didn't seem to make a difference. Albuterol gives him the jitters. He states that duoneb works when he needs it but he states that he doesn't need anything on a daily. If he needs a daily inhaler he will need something cheaper sent to [facility].

## 2023-08-03 ENCOUNTER — Encounter (INDEPENDENT_AMBULATORY_CARE_PROVIDER_SITE_OTHER): Payer: Self-pay | Admitting: Ophthalmology

## 2023-08-03 ENCOUNTER — Ambulatory Visit (INDEPENDENT_AMBULATORY_CARE_PROVIDER_SITE_OTHER): Payer: Medicare Other | Admitting: Ophthalmology

## 2023-08-03 DIAGNOSIS — H4042X0 Glaucoma secondary to eye inflammation, left eye, stage unspecified: Secondary | ICD-10-CM | POA: Diagnosis not present

## 2023-08-03 DIAGNOSIS — H35352 Cystoid macular degeneration, left eye: Secondary | ICD-10-CM | POA: Diagnosis not present

## 2023-08-03 DIAGNOSIS — H209 Unspecified iridocyclitis: Secondary | ICD-10-CM | POA: Diagnosis not present

## 2023-08-03 DIAGNOSIS — H401321 Pigmentary glaucoma, left eye, mild stage: Secondary | ICD-10-CM

## 2023-08-03 DIAGNOSIS — H02831 Dermatochalasis of right upper eyelid: Secondary | ICD-10-CM

## 2023-08-03 DIAGNOSIS — H57819 Brow ptosis, unspecified: Secondary | ICD-10-CM

## 2023-08-03 DIAGNOSIS — A6920 Lyme disease, unspecified: Secondary | ICD-10-CM

## 2023-08-03 DIAGNOSIS — H27122 Anterior dislocation of lens, left eye: Secondary | ICD-10-CM

## 2023-08-03 DIAGNOSIS — Z961 Presence of intraocular lens: Secondary | ICD-10-CM

## 2023-08-03 DIAGNOSIS — T85398A Other mechanical complication of other ocular prosthetic devices, implants and grafts, initial encounter: Secondary | ICD-10-CM

## 2023-08-09 DIAGNOSIS — E119 Type 2 diabetes mellitus without complications: Secondary | ICD-10-CM | POA: Diagnosis not present

## 2023-08-09 DIAGNOSIS — R5383 Other fatigue: Secondary | ICD-10-CM | POA: Diagnosis not present

## 2023-08-09 DIAGNOSIS — I10 Essential (primary) hypertension: Secondary | ICD-10-CM | POA: Diagnosis not present

## 2023-08-09 DIAGNOSIS — E876 Hypokalemia: Secondary | ICD-10-CM | POA: Diagnosis not present

## 2023-08-09 DIAGNOSIS — E1169 Type 2 diabetes mellitus with other specified complication: Secondary | ICD-10-CM | POA: Diagnosis not present

## 2023-08-12 ENCOUNTER — Ambulatory Visit: Payer: Self-pay | Admitting: *Deleted

## 2023-08-12 ENCOUNTER — Encounter: Payer: Self-pay | Admitting: *Deleted

## 2023-08-12 NOTE — Patient Outreach (Signed)
Care Coordination   Follow Up Visit Note   08/12/2023 Name: Christopher Randolph MRN: [id number] DOB: 25-Nov-1946  [name] Felton is a 77 y.o. year old male who sees Burdine, Ananias Pilgrim, MD for primary care. I spoke with  [name] by phone today.  What matters to the patients health and wellness today?  Ongoing management of blood sugar, CHF, and COPD    Goals Addressed             This Visit's Progress    Manage Blood Sugar   On track    Care Coordination Goals: Patient will continue to work with Steve Rattler, PharmD with [facility] Re: prescription assistance for Farxiga and Prolensa. That process has been started.  Patient will continue to take medications as prescribed Patient will continue to follow-up with PCP every 3 months Patient will continue to monitor and record blood sugar daily and as needed and will call provider with any readings less than 70 or greater than 200.  Patient will reach out to RN Care Coordinator [phone] with any care coordination or resource needs      Manage CHF   On track    Care Coordination Goals: Patient will continue to monitor and record weights each morning after urinating Patient will call cardiologist with any weight gain of >3 lbs overnight or >5lbs in 1 week Patient will continue to follow a low sodium, heart healthy diet with less than 2300 mg of sodium per day Patient will continue to maintain fluid restriction of 64 oz per day Patient will reach out to cardiologist or PCP with any new or worsening symptoms Patient will continue to monitor and record blood pressure daily and will reach out to provider with any readings outside of recommended range Patient will continue to take medications as prescribed Takes furosemide every other day and per cardiologist, can take an additional furosemide if weight is >2 lbs overnight or >5 lbs in one week Patient will follow-up with cardiologist every 6  months Patient will reach out to RN Care Coordinator [phone] with any care coordination or resource needs      Manage COPD   On track    Care Coordination Goals: Patient will follow-up with pulmonologist every 6 months and sooner if needed Patient will continue track and manage COPD triggers and reference COPD Action Plan handout that was previously provided as a guide as well as instructions given my pulmonologist  Patient will remain active and engage in light exercise as tolerated at least 3-5 days a week to aid in the the management of COPD Patient will continue monitor and record O2 levels daily and as needed Patient will call RN Care Coordinator [phone] with any care coordination or resource needs      COMPLETED: Resolve Left Antecubital Pain   On track    Care Coordination Goals: Patient will use voltaren gel on area as directed by package insert Patient will try ice or heat for 20 min at a time for symptom relieve Patient will review and use handout on bicep tendonitis and at home physical therapy exercises Patient can use a bicep tendonitis brace as needed for symptom management and during activity. Prolonged use is not recommended. Patient will follow-up with provider if pain persists or worsens or if new symptoms develop Patient will reach out to RN Care Coordinator at [phone] with any resource or care coordination needs  Pain has resolved. No additional follow-ups needed at this  time. Patient will reach out to provider with any new symptoms.         SDOH assessments and interventions completed:  Yes  SDOH Interventions Today    Flowsheet Row Most Recent Value  SDOH Interventions   Food Insecurity Interventions Intervention Not Indicated  Transportation Interventions Intervention Not Indicated  Physical Activity Interventions Intervention Not Indicated        Care Coordination Interventions:  Yes, provided  Interventions Today    Flowsheet Row  Most Recent Value  Chronic Disease   Chronic disease during today's visit Diabetes, Chronic Obstructive Pulmonary Disease (COPD), Congestive Heart Failure (CHF)  General Interventions   General Interventions Discussed/Reviewed General Interventions Discussed, General Interventions Reviewed, Labs, Annual Eye Exam, Durable Medical Equipment (DME), Doctor Visits  Labs Hgb A1c every 6 months  Doctor Visits Discussed/Reviewed Doctor Visits Discussed, Doctor Visits Reviewed, Annual Wellness Visits, PCP, Specialist  Durable Medical Equipment (DME) BP Cuff, Glucomoter, Other  [Pulse Ox. BS 148, O2 97%, BP 134/81, no weight available today because patient is camping and didn't bring scales. Wt has been stable over the past month. Blood sugar staying below 200 and mostly below 150. None below 70. Lowest O2 this month has 94%.]  PCP/Specialist Visits Compliance with follow-up visit  [Schedule Appt. in Dec 2024 with cardiologist, schedule Appt. in Feb 2025 with pulmonologist, keep Appt. with retina specialist on 05/02/24. F/U with PCP in Sept 2024 for A1C.]  Exercise Interventions   Exercise Discussed/Reviewed Exercise Discussed, Exercise Reviewed, Physical Activity  Physical Activity Discussed/Reviewed Physical Activity Discussed, Physical Activity Reviewed, Types of exercise  [Patient walks daily for approximately 30 minutes and stays busy around his home and yard]  Education Interventions   Education Provided Provided Education  Provided Verbal Education On Nutrition, Eye Care, Labs, Blood Sugar Monitoring, Medication, Exercise, When to see the doctor  [blood pressure monitoring, daily weights, O2 monitoring]  Labs Reviewed Hgb A1c  [03/27/33 A1C 6.6%]  Nutrition Interventions   Nutrition Discussed/Reviewed Nutrition Discussed, Nutrition Reviewed, Fluid intake, Decreasing salt, Carbohydrate meal planning  [Patient is following a generally healthy diet. He is limiting fluids to less than 64oz per day and sodium  to less than 2400 mg per day. He doesn't add salt to foods. He is keeping CHO to less than 30GM per meal and snacks to les than 15GM.]  Pharmacy Interventions   Pharmacy Dicussed/Reviewed Pharmacy Topics Discussed, Pharmacy Topics Reviewed, Medications and their functions, Affording Medications  [Taking medicatoins regularly as prescribed. Marcelline Deist and Prolensa are too expensive but he is working with Steve Rattler, PharmD at [facility] to complete patient assistance applications. Picked up a 15 day supply of Farxiga for now due to cost.]  Safety Interventions   Safety Discussed/Reviewed Safety Discussed, Safety Reviewed       Follow up plan: Follow up call scheduled for 09/14/23    Encounter Outcome:  Pt. Visit Completed   Demetrios Loll, BSN, RN-BC RN Care Coordinator [facility]  [facility] Direct Dial: [phone] Main #: [phone]

## 2023-08-13 NOTE — Telephone Encounter (Signed)
Ok to continue duonebs as needed Please make FU OV with APP, next available to  discuss   Patient aware of above.  No appointments available.  He will call back to schedule.

## 2023-08-16 DIAGNOSIS — I700 Atherosclerosis of aorta: Secondary | ICD-10-CM | POA: Diagnosis not present

## 2023-08-16 DIAGNOSIS — I25119 Atherosclerotic heart disease of native coronary artery with unspecified angina pectoris: Secondary | ICD-10-CM | POA: Diagnosis not present

## 2023-08-16 DIAGNOSIS — K429 Umbilical hernia without obstruction or gangrene: Secondary | ICD-10-CM | POA: Diagnosis not present

## 2023-08-16 DIAGNOSIS — E7849 Other hyperlipidemia: Secondary | ICD-10-CM | POA: Diagnosis not present

## 2023-08-16 DIAGNOSIS — D696 Thrombocytopenia, unspecified: Secondary | ICD-10-CM | POA: Diagnosis not present

## 2023-08-16 DIAGNOSIS — E1151 Type 2 diabetes mellitus with diabetic peripheral angiopathy without gangrene: Secondary | ICD-10-CM | POA: Diagnosis not present

## 2023-08-16 DIAGNOSIS — E039 Hypothyroidism, unspecified: Secondary | ICD-10-CM | POA: Diagnosis not present

## 2023-08-16 DIAGNOSIS — I10 Essential (primary) hypertension: Secondary | ICD-10-CM | POA: Diagnosis not present

## 2023-08-27 DIAGNOSIS — E039 Hypothyroidism, unspecified: Secondary | ICD-10-CM | POA: Diagnosis not present

## 2023-08-27 DIAGNOSIS — E1169 Type 2 diabetes mellitus with other specified complication: Secondary | ICD-10-CM | POA: Diagnosis not present

## 2023-09-14 ENCOUNTER — Ambulatory Visit: Payer: Self-pay | Admitting: *Deleted

## 2023-09-14 ENCOUNTER — Encounter: Payer: Self-pay | Admitting: *Deleted

## 2023-09-14 NOTE — Patient Outreach (Signed)
Care Coordination   Follow Up Visit Note   09/14/2023 Name: Christopher Randolph MRN: [id number] DOB: 1946/09/14  Christopher Randolph is a 76 y.o. year old male who sees Burdine, Ananias Pilgrim, MD for primary care. I spoke with  Christopher Randolph by phone today.  What matters to the patients health and wellness today?  Managing blood sugar, blood pressure, and COPD    Goals Addressed             This Visit's Progress    Manage Blood Sugar   On track    Care Coordination Goals: Patient will continue to work with Steve Rattler, PharmD with [facility] Re: prescription assistance for [place] and Prolensa. His first application was denied but she is going to send in an appeal. Patient will keep follow-up appointment with PCP in November or sooner if needed Patient will continue to monitor and record blood sugar daily and as needed and will call provider with any readings less than 70 or greater than 200.  Patient will reach out to RN Care Coordinator [phone] with any care coordination or resource needs      Manage CHF   On track    Care Coordination Goals: Patient will continue to monitor and record weights each morning after urinating Patient will call cardiologist with any weight gain of >3 lbs overnight or >5lbs in 1 week Patient will continue to follow a low sodium, heart healthy diet with less than 2300 mg of sodium per day Patient will continue to maintain fluid restriction of 64 oz per day Patient will reach out to cardiologist or PCP with any new or worsening symptoms Patient will continue to monitor and record blood pressure daily and will reach out to provider with any readings outside of recommended range Patient will continue to take medications as prescribed Takes furosemide every other day and per cardiologist, can take an additional furosemide if weight is >2 lbs overnight or >5 lbs in one week Patient will follow-up with cardiologist in 11/2023 and  sooner if needed Patient will reach out to RN Care Coordinator [phone] with any care coordination or resource needs      Manage COPD   On track    Care Coordination Goals: Patient will follow-up with pulmonologist in 01/2024 and sooner if needed Patient will continue track and manage COPD triggers and reference COPD Action Plan handout that was previously provided as a guide as well as instructions given by pulmonologist  Patient will remain active and engage in light exercise as tolerated at least 3-5 days a week to aid in the the management of COPD Patient will continue monitor and record SPO2 levels daily and as needed Patient will call RN Care Coordinator [phone] with any care coordination or resource needs         SDOH assessments and interventions completed:  Yes  SDOH Interventions Today    Flowsheet Row Most Recent Value  SDOH Interventions   Physical Activity Interventions Intervention Not Indicated  Health Literacy Interventions Intervention Not Indicated        Care Coordination Interventions:  Yes, provided  Interventions Today    Flowsheet Row Most Recent Value  Chronic Disease   Chronic disease during today's visit Chronic Obstructive Pulmonary Disease (COPD), Diabetes, Congestive Heart Failure (CHF)  General Interventions   General Interventions Discussed/Reviewed General Interventions Discussed, General Interventions Reviewed, Annual Eye Exam, Labs, Durable Medical Equipment (DME), Doctor Visits  Labs Hgb A1c every 6 months  Vaccines COVID-19, Flu, RSV, Pneumonia  Doctor Visits Discussed/Reviewed Doctor Visits Discussed, Doctor Visits Reviewed, PCP, Specialist  Durable Medical Equipment (DME) Glucomoter, BP Cuff, Other  [Scales & Pulse Ox. Weight stable at 170 lbs, Blood pressure 134/69, Blood Sugar 168, SPO2 94%]  PCP/Specialist Visits Compliance with follow-up visit  [PCP in November. Cardiology in December (on recall list). Pulmonology in Feb 2025  (on recall list)]  Exercise Interventions   Exercise Discussed/Reviewed Physical Activity  Physical Activity Discussed/Reviewed Physical Activity Discussed, Physical Activity Reviewed  [continue daily physical activity with at least 150 minutes a week. Ambulates without assistance and able to perform ADLs independently.]  Education Interventions   Education Provided Provided Education  Provided Verbal Education On Nutrition, When to see the doctor, Sick Day Rules, Blood Sugar Monitoring, Eye Care, Labs, Exercise, Medication, Applications  [blood pressure monitoring, daily weights, SPO2 mointoring.]  Labs Reviewed Hgb A1c  [03/27/33 A1C 6.6%]  Nutrition Interventions   Nutrition Discussed/Reviewed Nutrition Reviewed, Nutrition Discussed, Fluid intake, Decreasing salt  [Low sodium diet and fluid restriction of 64 oz per day.]  Pharmacy Interventions   Pharmacy Dicussed/Reviewed Pharmacy Topics Discussed, Pharmacy Topics Reviewed, Medications and their functions, Affording Medications, Medication Adherence  [Discussed what Trelegy is used for. Patient has it but has not been using it. Doesn't feel that he needs it. Will discuss with pulmonary again at appt. Compliant with other medications. Working with [employer] [facility] for prescription assistance.]  Medication Adherence Not taking medication  [Trelegy]  Safety Interventions   Safety Discussed/Reviewed Safety Discussed, Safety Reviewed, Fall Risk, Home Safety  [No falls since last telephone call]       Follow up plan: Follow up call scheduled for 10/14/23    Encounter Outcome:  Patient Visit Completed   Demetrios Loll, RN, BSN Care Management Coordinator [facility]  [facility] Direct Dial: [phone] Main #: [phone]

## 2023-09-17 ENCOUNTER — Other Ambulatory Visit: Payer: Self-pay | Admitting: *Deleted

## 2023-09-17 DIAGNOSIS — K429 Umbilical hernia without obstruction or gangrene: Secondary | ICD-10-CM

## 2023-09-21 ENCOUNTER — Telehealth: Payer: Self-pay | Admitting: *Deleted

## 2023-09-21 ENCOUNTER — Ambulatory Visit: Payer: Medicare Other | Admitting: General Surgery

## 2023-09-21 ENCOUNTER — Encounter: Payer: Self-pay | Admitting: *Deleted

## 2023-09-21 ENCOUNTER — Encounter: Payer: Self-pay | Admitting: General Surgery

## 2023-09-21 ENCOUNTER — Telehealth: Payer: Self-pay

## 2023-09-21 ENCOUNTER — Other Ambulatory Visit: Payer: Self-pay

## 2023-09-21 VITALS — BP 120/73 | HR 59 | Temp 97.8°F | Resp 16 | Ht 68.0 in | Wt 177.0 lb

## 2023-09-21 DIAGNOSIS — K429 Umbilical hernia without obstruction or gangrene: Secondary | ICD-10-CM | POA: Insufficient documentation

## 2023-09-21 NOTE — Patient Instructions (Signed)
Open Hernia Repair, Adult Open hernia repair is surgery to fix a hernia. A hernia happens when part of your body pushes out through a weak spot in the muscles of your belly. This makes a bulge. You may need surgery if the bulge can't be pushed back into place and: The contents of your belly are stuck in the opening. The blood supply is cut off. The hernia gets bigger and causes more discomfort. Tell a health care provider about: Any allergies you have. All medicines you take. These include vitamins, herbs, eye drops, and creams you use. Any problems you or family members have had with anesthesia. Any medical conditions you have. These include blood and bone problems. Any surgeries you've had. Any recent cold or flu symptoms. Whether you're pregnant or may be pregnant. What are the risks? Your health care provider will talk with you about risks. These may include: Bleeding. Infection. Damage to nearby structures or organs. These include the bladder, blood vessels, intestines, nerves, or testicles. Blood clots in your legs or lungs. Trouble peeing. The hernia coming back. What happens before the surgery? Medicines Ask about changing or stopping: Any medicines you take. Any vitamins, herbs, or supplements you take. Do not take aspirin or ibuprofen unless you're told to. Surgery safety For your safety, your provider may: Remove hair at the surgery site. Ask you to wash your skin with a soap that kills germs. Give you antibiotics. Tests You may have exams or tests. These may include: Blood tests. Imaging tests. General instructions Do not smoke, vape, or use any products that have nicotine or tobacco for at least 4 weeks before the surgery. If you need help quitting, talk with your provider. Ask if you'll be staying overnight in the hospital. If you'll be going home right after the surgery, plan to have a responsible adult: Drive you home from the hospital or clinic. You won't be  allowed to drive. Stay with you for the time you're told. What happens during the surgery? An IV will be put into a vein in your hand or arm. You will be given: A sedative. This helps you relax. Anesthesia. This keeps you from feeling pain. It will make you fall asleep for surgery. A cut will be made over the hernia. The bulge will be put back into place. The edges of the hernia may be stitched together. The opening in the belly muscles will be closed with stitches. In some cases, a mesh patch may be put over the opening instead. The cut from surgery will be closed with stitches, skin glue, or tape strips. A bandage may be put over it. The surgery may vary among providers and hospitals. What happens after the surgery? You'll be watched closely until you leave. This includes checking your blood pressure, heart rate, breathing rate, and blood oxygen level. You may be given medicine to help with pain. This information is not intended to replace advice given to you by your health care provider. Make sure you discuss any questions you have with your health care provider. Document Revised: 03/26/2023 Document Reviewed: 03/26/2023 Elsevier Patient Education  2024 [employer].

## 2023-09-21 NOTE — Telephone Encounter (Signed)
  Patient Consent for Virtual Visit         Christopher Randolph has provided verbal consent on 09/21/2023 for a virtual visit (video or telephone).   CONSENT FOR VIRTUAL VISIT FOR:  Christopher Randolph  By participating in this virtual visit I agree to the following:  I hereby voluntarily request, consent and authorize [facility] and its employed or contracted physicians, physician assistants, nurse practitioners or other licensed health care professionals (the Practitioner), to provide me with telemedicine health care services (the "Services") as deemed necessary by the treating Practitioner. I acknowledge and consent to receive the Services by the Practitioner via telemedicine. I understand that the telemedicine visit will involve communicating with the Practitioner through live audiovisual communication technology and the disclosure of certain medical information by electronic transmission. I acknowledge that I have been given the opportunity to request an in-person assessment or other available alternative prior to the telemedicine visit and am voluntarily participating in the telemedicine visit.  I understand that I have the right to withhold or withdraw my consent to the use of telemedicine in the course of my care at any time, without affecting my right to future care or treatment, and that the Practitioner or I may terminate the telemedicine visit at any time. I understand that I have the right to inspect all information obtained and/or recorded in the course of the telemedicine visit and may receive copies of available information for a reasonable fee.  I understand that some of the potential risks of receiving the Services via telemedicine include:  Delay or interruption in medical evaluation due to technological equipment failure or disruption; Information transmitted may not be sufficient (e.g. poor resolution of images) to allow for appropriate medical decision making by the  Practitioner; and/or  In rare instances, security protocols could fail, causing a breach of personal health information.  Furthermore, I acknowledge that it is my responsibility to provide information about my medical history, conditions and care that is complete and accurate to the best of my ability. I acknowledge that Practitioner's advice, recommendations, and/or decision may be based on factors not within their control, such as incomplete or inaccurate data provided by me or distortions of diagnostic images or specimens that may result from electronic transmissions. I understand that the practice of medicine is not an exact science and that Practitioner makes no warranties or guarantees regarding treatment outcomes. I acknowledge that a copy of this consent can be made available to me via my patient portal [facility] MyChart), or I can request a printed copy by calling the office of [facility].    I understand that my insurance will be billed for this visit.   I have read or had this consent read to me. I understand the contents of this consent, which adequately explains the benefits and risks of the Services being provided via telemedicine.  I have been provided ample opportunity to ask questions regarding this consent and the Services and have had my questions answered to my satisfaction. I give my informed consent for the services to be provided through the use of telemedicine in my medical care

## 2023-09-21 NOTE — Progress Notes (Unsigned)
[facility] History and Physical  Reason for Referral:*** Referring Physician: ***  Chief Complaint   New Patient (Initial Visit)     Christopher Randolph is a 77 y.o. male.  HPI: ***.  The *** started *** and has had a duration of ***.  It is associated with ***.  The *** is improved with ***, and is made worse with ***.    Quality*** Context***  Past Medical History:  Diagnosis Date   AAA (abdominal aortic aneurysm) (HCC)    Arthritis    CAD (coronary artery disease)    Branch vessel and moderate RCA disease 2006   Cancer [facility]) 1990   Melanoma Lower right Leg   Carotid artery disease (HCC)    COPD (chronic obstructive pulmonary disease) (HCC)    Essential hypertension    Glaucoma    Hyperlipidemia    Hypothyroidism    Lyme disease    PAD (peripheral artery disease) (HCC)    Left common iliac stent 2004   Pneumonia    TIA (transient ischemic attack) 02/2022   Type 2 diabetes mellitus (HCC)    Wears dentures    Wears glasses     Past Surgical History:  Procedure Laterality Date   CATARACT EXTRACTION Bilateral    CATARACT EXTRACTION, BILATERAL     CERVICAL DISC SURGERY     x 1   COLONOSCOPY N/A 11/06/2016   Procedure: COLONOSCOPY;  Surgeon: Corbin Ade, MD;  [facility];  Service: Endoscopy;  Laterality: N/A;  7:30 AM   COLONOSCOPY  2012   Dr. Gabriel Cirri: normal. reviewed reports, which states he has a history of polyps in remote past.    ELBOW SURGERY Left    EYE SURGERY Bilateral    Cat Sx   EYE SURGERY Left 01/14/2021   [facility] - Dr. Arville Go   IRIDOTOMY / IRIDECTOMY Left 01/14/2021   [facility] - Dr. Arville Go   JOINT REPLACEMENT     LUMBAR WOUND DEBRIDEMENT N/A 01/24/2019   Procedure: LUMBAR WOUND Exploration for Evacation of Seroma vs. Hematoma;  Surgeon: Shirlean Kelly, MD;  Location: [facility] OR;  Service: Neurosurgery;  Laterality: N/A;   MELANOMA EXCISION     right leg, seen at [facility] and underwent immunotherapy   NECK SURGERY       x 1 yrs ago   PARS PLANA VITRECTOMY Left 04/04/2020   Procedure: PARS PLANA VITRECTOMY WITH 25G REMOVAL/SUTURE INTRAOCULAR LENS;  Surgeon: Rennis Chris, MD;  Location: [facility] OR;  Service: Ophthalmology;  Laterality: Left;   REPLACEMENT TOTAL KNEE Right    TRANSCAROTID ARTERY REVASCULARIZATION  Right 03/25/2022   Procedure: Right Transcarotid Artery Revascularization;  Surgeon: Nada Libman, MD;  Location: [facility] OR;  Service: Vascular;  Laterality: Right;   WISDOM TOOTH EXTRACTION      Family History  Problem Relation Age of Onset   Heart disease Mother    Heart attack Mother    Colon polyps Mother    Colon polyps Brother    Heart attack Maternal Grandmother    Heart attack Maternal Grandfather    Colon cancer Neg Hx     Social History   Tobacco Use   Smoking status: Former    Current packs/day: 0.00    Average packs/day: 1.5 packs/day for 40.0 years (60.0 ttl pk-yrs)    Types: Cigarettes    Start date: 11/04/1963    Quit date: 10/29/2003    Years since quitting: 19.9    Passive exposure: Never  Smokeless tobacco: Never  Vaping Use   Vaping status: Never Used  Substance Use Topics   Alcohol use: Yes    Alcohol/week: 15.0 standard drinks of alcohol    Types: 14 Cans of beer, 1 Standard drinks or equivalent per week    Comment: 2 beers or a mixed drink   Drug use: No    Medications: {medication reviewed/display:3041432} Allergies as of 09/21/2023       Reactions   Erythromycin Swelling   Lip swelling   Shellfish Allergy Rash, Other (See Comments)   Gout flares   Nitrofuran Derivatives Swelling   Nitrostat [nitroglycerin] Other (See Comments)   Severe hypotension   Zithromax [azithromycin] Swelling        Medication List        Accurate as of September 21, 2023  9:30 AM. If you have any questions, ask your nurse or doctor.          STOP taking these medications    predniSONE 10 MG tablet Commonly known as: DELTASONE Stopped by: Christopher Randolph        TAKE these medications    acetaminophen 500 MG tablet Commonly known as: TYLENOL Take 1,000 mg by mouth daily as needed for moderate pain, fever or headache.   albuterol 108 (90 Base) MCG/ACT inhaler Commonly known as: VENTOLIN HFA Inhale 2 puffs into the lungs every 6 (six) hours as needed for wheezing or shortness of breath.   aspirin EC 81 MG tablet Take 81 mg by mouth daily.   atorvastatin 20 MG tablet Commonly known as: LIPITOR TAKE 1 TABLET BY MOUTH DAILY   clopidogrel 75 MG tablet Commonly known as: PLAVIX TAKE 1 TABLET BY MOUTH DAILY   dapagliflozin propanediol 10 MG Tabs tablet Commonly known as: Farxiga Take 1 tablet (10 mg total) by mouth daily before breakfast.   furosemide 20 MG tablet Commonly known as: LASIX Take 1 tablet (20 mg total) by mouth every other day.   guaiFENesin-dextromethorphan 100-10 MG/5ML syrup Commonly known as: ROBITUSSIN DM Take 5 mLs by mouth every 4 (four) hours as needed for cough (chest congestion).   ipratropium-albuterol 0.5-2.5 (3) MG/3ML Soln Commonly known as: DUONEB Take 3 mLs by nebulization every 6 (six) hours as needed.   latanoprost 0.005 % ophthalmic solution Commonly known as: XALATAN Place 1 drop into the left eye at bedtime.   levothyroxine 175 MCG tablet Commonly known as: SYNTHROID Take 175 mcg by mouth See admin instructions. 175 mcg every 3rd day.   levothyroxine 150 MCG tablet Commonly known as: SYNTHROID Take 150 mcg by mouth See admin instructions. 150 mcg once daily for 2 days, every 3rd day - repeat.   lisinopril 40 MG tablet Commonly known as: ZESTRIL Take 40 mg by mouth daily. What changed: Another medication with the same name was removed. Continue taking this medication, and follow the directions you see here. Changed by: Christopher Randolph   prednisoLONE acetate 1 % ophthalmic suspension Commonly known as: PRED FORTE Place 1 drop into the left eye daily.   Prolensa 0.07 %  Soln Generic drug: Bromfenac Sodium Place 1 drop into the left eye daily.   tamsulosin 0.4 MG Caps capsule Commonly known as: FLOMAX Take 0.8 mg by mouth at bedtime.   Trelegy Ellipta 100-62.5-25 MCG/ACT Aepb Generic drug: Fluticasone-Umeclidin-Vilant Inhale 1 puff into the lungs daily.         ROS:  {Review of Systems:30496}  Blood pressure 120/73, pulse (!) 59, temperature 97.8 F (36.6  C), temperature source Oral, resp. rate 16, height 5\' 8"  (1.727 m), weight 177 lb (80.3 kg), SpO2 94%. Physical Exam  Results: No results found for this or any previous visit (from the past 48 hour(s)).  No results found.   Assessment & Plan:  Christopher Randolph is a 77 y.o. male with *** -*** -*** -Follow up ***  All questions were answered to the satisfaction of the patient and family***.  The risk and benefits of *** were discussed including but not limited to ***.  After careful consideration, Christopher Randolph has decided to ***.    Christopher Randolph 09/21/2023, 9:30 AM

## 2023-09-21 NOTE — Telephone Encounter (Signed)
Patient scheduled for tele visit on 10/05/23. Med rec and consent

## 2023-09-21 NOTE — Telephone Encounter (Signed)
Pre-operative Risk Assessment    Patient Name: Christopher Randolph  DOB: [birth year] MRN: [id number]   LAST O/V 04/07/23 NO FUTURE APPTS SCHEDULD  Request for Surgical Clearance    Procedure:  XI ROBOTIC ASSISTED UMBILICAL HERNIA REAPIR W/ MESH  Date of Surgery:  Clearance TBD                                 Surgeon:  [name], MD Surgeon's Group or Practice Name:  [facility] Phone number:  [phone] Fax number:  [phone]   Type of Clearance Requested:   - Medical  - Pharmacy:  Hold Clopidogrel (Plavix) and FARXIGA   PLAVIX X'S 5 DAYS FARXIGA X'S 3 DAYS    Type of Anesthesia:  General    Additional requests/questions:    Wilhemina Cash   09/21/2023, 10:08 AM

## 2023-09-21 NOTE — Telephone Encounter (Signed)
Name: Christopher Randolph  DOB: February 07, 1946  MRN: [id number]  Primary Cardiologist: Nona Dell, MD   Preoperative team, please contact this patient and set up a phone call appointment for further preoperative risk assessment. Please obtain consent and complete medication review. Thank you for your help.  I confirm that guidance regarding antiplatelet and oral anticoagulation therapy has been completed and, if necessary, noted below.   Please reach out to [facility] for plavix hold following [facility].  OK to hold farxiga from a cardiac perspective.     Marcelino Duster, PA 09/21/2023, 2:56 PM [facility]

## 2023-09-22 ENCOUNTER — Encounter: Payer: Self-pay | Admitting: *Deleted

## 2023-09-27 ENCOUNTER — Telehealth (INDEPENDENT_AMBULATORY_CARE_PROVIDER_SITE_OTHER): Payer: Medicare Other | Admitting: General Surgery

## 2023-09-27 DIAGNOSIS — K429 Umbilical hernia without obstruction or gangrene: Secondary | ICD-10-CM

## 2023-09-27 NOTE — Telephone Encounter (Signed)
Request to hold Plavix sent to [facility] on 09/22/2023.

## 2023-09-27 NOTE — Telephone Encounter (Signed)
[facility]  Dr. Melvyn Novas let me know that the patient had a reaction to fiberwire suture by arthrex. This is made of polyester. We would want to avoid polyester materials.   We could still fix this with mesh since this is polyprolene and I could use absorbable Vicryl or permanent Novafil (polyglycol and polybutylene terephthalate).   Given the small defect 1-2 cm, I think an open repair with mesh is the best option. We will avoid any polyester material.   Discussed this with the patient.   He is talking with cardiology 10/8 for cardiac risk.   Based on the telephone notes it sounds like vascular prescribes his plavix, and cardiology wanted [name] to verify with them that it was ok to hold.  Can get him on schedule after his 10/8 telephone appt if all is well by cardiology.   [name], MD [facility] [address] [place] [place], [place] [address] [phone] (office)

## 2023-09-28 NOTE — Progress Notes (Signed)
Fax received from [facility] on 09/22/23 for medical clearance/medication hold for Xi robotic assisted umbilical hernia repair with mesh to be signed by T. Lenell Antu, MD.  Provider signed on 09/27/23, scanned into pt's chart, media routed via MyChart to sender on 09/28/23.

## 2023-10-03 NOTE — Progress Notes (Unsigned)
Virtual Visit via Telephone Note   Because of Christopher Randolph's co-morbid illnesses, he is at least at moderate risk for complications without adequate follow up.  This format is felt to be most appropriate for this patient at this time.  The patient did not have access to video technology/had technical difficulties with video requiring transitioning to audio format only (telephone).  All issues noted in this document were discussed and addressed.  No physical exam could be performed with this format.  Please refer to the patient's chart for his consent to telehealth for [facility].  Evaluation Performed:  Preoperative cardiovascular risk assessment _____________   Date:  10/05/2023   Patient ID:  Christopher Randolph, Christopher Randolph [birth year], MRN [id number] Patient Location:  Home Provider location:   Office  Primary Care Provider:  Juliette Alcide, MD Primary Cardiologist:  Nona Dell, MD  Chief Complaint / Patient Profile   77 y.o. y/o male with a h/o coronary artery disease with history of branch vessel and moderate RCA stenosis as of 2006, with follow-up Lexiscan Myoview 2023 indicating evidence of previous inferior infarct scar but no active ischemia with LVEF of 64%.  Chronic New York Heart Association Class II dyspnea,from COPD, chronic diastolic heart failure, hypertension, hyperlipidemia, carotid artery disease status post TCAR March 2023.  He is pending XI robotic assisted umbilical hernia repair with mesh by Dr. Larae Grooms, [facility] scheduled on date to be determined and presents today for telephonic preoperative cardiovascular risk assessment.  History of Present Illness    Jye ANTERRIO RANDELL is a 77 y.o. male who presents via audio/video conferencing for a telehealth visit today.  Pt was last seen in cardiology clinic on 04/07/2023 by Diona Browner.  At that time [name] was doing well and stable from a cardiac perspective..  The patient is  now pending procedure as outlined above. Since his last visit, he has been doing well, remains very active, working in his yard, mowing 5 acres., working in his shop. No chest pain, chronic dyspnea from COPD, some activity limitations from knee issues.   Past Medical History    Past Medical History:  Diagnosis Date   AAA (abdominal aortic aneurysm) (HCC)    Arthritis    CAD (coronary artery disease)    Branch vessel and moderate RCA disease 2006   Cancer [facility]) 1990   Melanoma Lower right Leg   Carotid artery disease (HCC)    COPD (chronic obstructive pulmonary disease) (HCC)    Essential hypertension    Glaucoma    Hyperlipidemia    Hypothyroidism    Lyme disease    PAD (peripheral artery disease) (HCC)    Left common iliac stent 2004   Pneumonia    TIA (transient ischemic attack) 02/2022   Type 2 diabetes mellitus (HCC)    Wears dentures    Wears glasses    Past Surgical History:  Procedure Laterality Date   CATARACT EXTRACTION Bilateral    CATARACT EXTRACTION, BILATERAL     CERVICAL DISC SURGERY     x 1   COLONOSCOPY N/A 11/06/2016   Procedure: COLONOSCOPY;  Surgeon: Corbin Ade, MD;  Location: [facility];  Service: Endoscopy;  Laterality: N/A;  7:30 AM   COLONOSCOPY  2012   Dr. Gabriel Cirri: normal. reviewed reports, which states he has a history of polyps in remote past.    ELBOW SURGERY Left    EYE SURGERY Bilateral    Cat Sx   EYE  SURGERY Left 01/14/2021   [facility] - Dr. Arville Go   IRIDOTOMY / IRIDECTOMY Left 01/14/2021   [facility] - Dr. Arville Go   JOINT REPLACEMENT     LUMBAR WOUND DEBRIDEMENT N/A 01/24/2019   Procedure: LUMBAR WOUND Exploration for Evacation of Seroma vs. Hematoma;  Surgeon: Shirlean Kelly, MD;  Location: [facility] OR;  Service: Neurosurgery;  Laterality: N/A;   MELANOMA EXCISION     right leg, seen at [facility] and underwent immunotherapy   NECK SURGERY      x 1 yrs ago   PARS PLANA VITRECTOMY Left 04/04/2020   Procedure: PARS PLANA VITRECTOMY WITH  25G REMOVAL/SUTURE INTRAOCULAR LENS;  Surgeon: Rennis Chris, MD;  Location: [facility] OR;  Service: Ophthalmology;  Laterality: Left;   REPLACEMENT TOTAL KNEE Right    TRANSCAROTID ARTERY REVASCULARIZATION  Right 03/25/2022   Procedure: Right Transcarotid Artery Revascularization;  Surgeon: Nada Libman, MD;  Location: [facility] OR;  Service: Vascular;  Laterality: Right;   WISDOM TOOTH EXTRACTION      Allergies  Allergies  Allergen Reactions   Erythromycin Swelling    Lip swelling   Shellfish Allergy Rash and Other (See Comments)    Gout flares   Nitrofuran Derivatives Swelling   Nitrostat [Nitroglycerin] Other (See Comments)    Severe hypotension   Zithromax [Azithromycin] Swelling    Home Medications    Prior to Admission medications   Medication Sig Start Date End Date Taking? Authorizing Provider  acetaminophen (TYLENOL) 500 MG tablet Take 1,000 mg by mouth daily as needed for moderate pain, fever or headache.    [provider]  albuterol (VENTOLIN HFA) 108 (90 Base) MCG/ACT inhaler Inhale 2 puffs into the lungs every 6 (six) hours as needed for wheezing or shortness of breath. Patient not taking: Reported on 09/21/2023 04/01/23   Burnadette Pop, MD  aspirin EC 81 MG tablet Take 81 mg by mouth daily.    [provider]  atorvastatin (LIPITOR) 20 MG tablet TAKE 1 TABLET BY MOUTH DAILY 06/01/23   Jonelle Sidle, MD  Bromfenac Sodium (PROLENSA) 0.07 % SOLN Place 1 drop into the left eye daily. 09/02/22   Rennis Chris, MD  clopidogrel (PLAVIX) 75 MG tablet TAKE 1 TABLET BY MOUTH DAILY 02/08/23   Jonelle Sidle, MD  dapagliflozin propanediol (FARXIGA) 10 MG TABS tablet Take 1 tablet (10 mg total) by mouth daily before breakfast. 12/22/22   Jonelle Sidle, MD  Fluticasone-Umeclidin-Vilant (TRELEGY ELLIPTA) 100-62.5-25 MCG/ACT AEPB Inhale 1 puff into the lungs daily. Patient not taking: Reported on 09/21/2023 05/31/23   Oretha Milch, MD  furosemide (LASIX) 20 MG tablet  Take 1 tablet (20 mg total) by mouth every other day. 08/24/22   Jonelle Sidle, MD  guaiFENesin-dextromethorphan (ROBITUSSIN DM) 100-10 MG/5ML syrup Take 5 mLs by mouth every 4 (four) hours as needed for cough (chest congestion). Patient not taking: Reported on 09/21/2023 04/01/23   Burnadette Pop, MD  ipratropium-albuterol (DUONEB) 0.5-2.5 (3) MG/3ML SOLN Take 3 mLs by nebulization every 6 (six) hours as needed. Patient not taking: Reported on 09/21/2023 04/01/23   Burnadette Pop, MD  latanoprost (XALATAN) 0.005 % ophthalmic solution Place 1 drop into the left eye at bedtime. 02/11/22   [provider]  levothyroxine (SYNTHROID) 175 MCG tablet Take 175 mcg by mouth See admin instructions. 175 mcg every 3rd day.    [provider]  levothyroxine (SYNTHROID, LEVOTHROID) 150 MCG tablet Take 150 mcg by mouth See admin instructions. 150 mcg once daily for  2 days, every 3rd day - repeat. 11/01/18   [provider]  lisinopril (ZESTRIL) 40 MG tablet Take 40 mg by mouth daily. 09/09/23   [provider]  prednisoLONE acetate (PRED FORTE) 1 % ophthalmic suspension Place 1 drop into the left eye daily.    [provider]  tamsulosin (FLOMAX) 0.4 MG CAPS capsule Take 0.8 mg by mouth at bedtime.    [provider]    Physical Exam    Vital Signs:  Sahand L Lozito does not have vital signs available for review today.  Given telephonic nature of communication, physical exam is limited. AAOx3. NAD. Normal affect.  Speech and respirations are unlabored.  Accessory Clinical Findings    None  Assessment & Plan    1.  Preoperative Cardiovascular Risk Assessment:  According to the Revised Cardiac Risk Index (RCRI), his Perioperative Risk of Major Cardiac Event is (%): 0.9  His Functional Capacity in METs is: 6.36 according to the Duke Activity Status Index (DASI).   Per office protocol, if patient is without any new symptoms or concerns at the  time of their virtual visit, he may hold Plavix for 5 days prior to procedure. Please resume Plavix as soon as possible postprocedure, at the discretion of the surgeon.    Per office protocol, if patient is without any new symptoms or concerns at the time of their virtual visit, he may hold [place] for 3 days prior to procedure. Please resume Marcelline Deist as soon as possible postprocedure, at the discretion of the surgeon.    The patient was advised that if he develops new symptoms prior to surgery to contact our office to arrange for a follow-up visit, and he verbalized understanding.  Therefore, based on ACC/AHA guidelines, patient would be at acceptable risk for the planned procedure without further cardiovascular testing. I will route this recommendation to the requesting party via Epic fax function.   A copy of this note will be routed to requesting surgeon.   Time:   Today, I have spent 10 minutes with the patient with telehealth technology discussing medical history, symptoms, and management plan.     Joni Reining DNP, ANP, AACC   10/05/2023, 9:23 AM

## 2023-10-05 ENCOUNTER — Ambulatory Visit: Payer: Medicare Other | Attending: Cardiovascular Disease

## 2023-10-05 DIAGNOSIS — Z01810 Encounter for preprocedural cardiovascular examination: Secondary | ICD-10-CM | POA: Diagnosis not present

## 2023-10-05 DIAGNOSIS — Z01818 Encounter for other preprocedural examination: Secondary | ICD-10-CM

## 2023-10-08 ENCOUNTER — Other Ambulatory Visit (INDEPENDENT_AMBULATORY_CARE_PROVIDER_SITE_OTHER): Payer: Self-pay | Admitting: Ophthalmology

## 2023-10-11 ENCOUNTER — Other Ambulatory Visit (INDEPENDENT_AMBULATORY_CARE_PROVIDER_SITE_OTHER): Payer: Self-pay

## 2023-10-11 ENCOUNTER — Other Ambulatory Visit: Payer: Self-pay | Admitting: *Deleted

## 2023-10-11 DIAGNOSIS — K429 Umbilical hernia without obstruction or gangrene: Secondary | ICD-10-CM

## 2023-10-11 MED ORDER — BROMFENAC SODIUM 0.07 % OP SOLN: 1.0000 [drp] | Freq: Every day | OPHTHALMIC | 6 refills | Status: AC

## 2023-10-14 ENCOUNTER — Encounter: Payer: Self-pay | Admitting: *Deleted

## 2023-10-14 ENCOUNTER — Ambulatory Visit: Payer: Self-pay | Admitting: *Deleted

## 2023-10-14 NOTE — Patient Outreach (Signed)
Care Coordination   Follow Up Visit Note   10/14/2023 Name: Christopher Randolph MRN: [id number] DOB: 11/22/1946  Christopher Randolph is a 77 y.o. year old male who sees Burdine, Ananias Pilgrim, MD for primary care. I spoke with  [name] by phone today.  What matters to the patients health and wellness today?  Continuing to manage blood sugar, heart failure, and COPD.    Goals Addressed             This Visit's Progress    Manage Blood Sugar   On track    Care Coordination Goals: Patient will keep follow-up appointment with PCP in November or sooner if needed Patient will continue to monitor and record blood sugar daily and as needed and will call provider with any readings less than 70 or greater than 200.  Patient will eat 3 meals per day with less than 30 GM of carbohydrates and up to 2 snacks, if needed, with less than 15 GM of carbohydrates Patient will reach out to RN Care Coordinator [phone] with any care coordination or resource needs      Manage CHF   On track    Care Coordination Goals: Patient will continue to monitor and record weights each morning after urinating Patient will call cardiologist with any weight gain of >3 lbs overnight or >5lbs in 1 week Patient will continue to follow a low sodium, heart healthy diet with less than 2300 mg of sodium per day Patient will continue to maintain fluid restriction of 64 oz per day Patient will reach out to cardiologist or PCP with any new or worsening symptoms Patient will continue to monitor and record blood pressure daily and will reach out to provider with any readings outside of recommended range Patient will continue to take medications as prescribed Takes furosemide every other day and per cardiologist, can take an additional furosemide if weight is >2 lbs overnight or >5 lbs in one week Patient will follow-up with cardiologist in 11/2023 and sooner if needed Patient will reach out to RN Care Coordinator  [phone] with any care coordination or resource needs      Manage COPD   On track    Care Coordination Goals: Patient will follow-up with pulmonologist in 01/2024 and sooner if needed Patient will continue track and manage COPD triggers and reference COPD Action Plan handout that was previously provided as a guide as well as instructions given by pulmonologist  Patient will remain active and engage in light exercise as tolerated at least 3-5 days a week to aid in the the management of COPD Patient will continue monitor and record SPO2 levels daily and as needed Patient will call RN Care Coordinator [phone] with any care coordination or resource needs         SDOH assessments and interventions completed:  Yes  SDOH Interventions Today    Flowsheet Row Most Recent Value  SDOH Interventions   Transportation Interventions Intervention Not Indicated  Health Literacy Interventions Intervention Not Indicated        Care Coordination Interventions:  Yes, provided  Interventions Today    Flowsheet Row Most Recent Value  Chronic Disease   Chronic disease during today's visit Diabetes, Chronic Obstructive Pulmonary Disease (COPD), Congestive Heart Failure (CHF)  General Interventions   General Interventions Discussed/Reviewed General Interventions Discussed, General Interventions Reviewed, Labs, Durable Medical Equipment (DME), Doctor Visits  Labs Hgb A1c every 3 months, Kidney Function  Doctor Visits Discussed/Reviewed Doctor Visits Discussed, Doctor  Visits Reviewed, PCP, Specialist  Durable Medical Equipment (DME) Glucomoter, BP Cuff, Other  [Scales & pulse ox. Glucose 159, BP 137/73, 171.4 lbs, SPO2 93%]  PCP/Specialist Visits Compliance with follow-up visit  [Follow-up with PCP in November as scheduled. Follow-up with cardiologist in December. Follow-up with pulmonologist in 01/2024. Scheduled for umbical hernia repair on 10/22/23 at [facility]]  Exercise  Interventions   Exercise Discussed/Reviewed Exercise Discussed, Exercise Reviewed, Physical Activity  Physical Activity Discussed/Reviewed Physical Activity Discussed, Physical Activity Reviewed  [encouraged to remain physically active and to aim for at least 150 minutes per week. Rest as needed.]  Education Interventions   Education Provided Provided Education  Provided Verbal Education On Nutrition, Blood Sugar Monitoring, When to see the doctor, Exercise, Medication, Other  [blood pressure monitoring, daily weights, oxygen level monitoring]  Pharmacy Interventions   Pharmacy Dicussed/Reviewed Pharmacy Topics Discussed, Pharmacy Topics Reviewed, Medications and their functions  [taking medications as prescribed. Will wait until next year to pursue prescription assistance for Farxiga and prolensa since it's close to the end of the year.]  Safety Interventions   Safety Discussed/Reviewed Safety Discussed, Safety Reviewed       Follow up plan: Follow up call scheduled for 11/17/23    Encounter Outcome:  Patient Visit Completed   Demetrios Loll, RN, BSN Care Management Coordinator [facility]  [facility] Direct Dial: [phone] Main #: [phone]

## 2023-10-14 NOTE — H&P (Signed)
[facility] History and Physical   Reason for Referral: Umbilical hernia  Referring Physician: Juliette Alcide, MD     Chief Complaint   New Patient (Initial Visit)        Christopher Randolph is a 77 y.o. male.  HPI: Christopher Randolph is a 77 yo who has had an umbilical hernia for several years but it do not cause him much distress. The area has been getting worse since April when he had a rhinovirus and was admitted for respiratory distress. He comes today to discuss his hernia repair. He has CHF and is followed by Dr. Diona Browner and saw them earlier this year. He is on [place] for both his heart failure and his DM. He reports that he had had issues in the past with his elbow surgery and having a "reaction" or infection to the sutures. He says he had multiple surgeries on this left elbow.    He also describes a hematoma after his spine surgery. His daughter who is a PA in another [place] wanted to make sure we were aware.   I have looked through the charts and cannot find any information about the left elbow surgery. I see in 2022 he went to PT and had an MRI after a fall where he partially tore the biceps tendon. I reviewed the chart after he had left and I did not see anything specific but I called the patient. He told me he had [facility]. I was then able to find information about this in the [facility] section and found that Dr. Melvyn Novas had done the surgeries.        Past Medical History:  Diagnosis Date   AAA (abdominal aortic aneurysm) (HCC)     Arthritis     CAD (coronary artery disease)      Branch vessel and moderate RCA disease 2006   Cancer [facility]) 1990    Melanoma Lower right Leg   Carotid artery disease (HCC)     COPD (chronic obstructive pulmonary disease) (HCC)     Essential hypertension     Glaucoma     Hyperlipidemia     Hypothyroidism     Lyme disease     PAD (peripheral artery disease) (HCC)      Left common iliac stent 2004    Pneumonia     TIA (transient ischemic attack) 02/2022   Type 2 diabetes mellitus (HCC)     Wears dentures     Wears glasses                 Past Surgical History:  Procedure Laterality Date   CATARACT EXTRACTION Bilateral     CATARACT EXTRACTION, BILATERAL       CERVICAL DISC SURGERY        x 1   COLONOSCOPY N/A 11/06/2016    Procedure: COLONOSCOPY;  Surgeon: Corbin Ade, MD;  [facility];  Service: Endoscopy;  Laterality: N/A;  7:30 AM   COLONOSCOPY   2012    Dr. Gabriel Cirri: normal. reviewed reports, which states he has a history of polyps in remote past.    ELBOW SURGERY Left     EYE SURGERY Bilateral      Cat Sx   EYE SURGERY Left 01/14/2021    [facility] - Dr. Arville Go   IRIDOTOMY / IRIDECTOMY Left 01/14/2021    [facility] - Dr. Arville Go   JOINT REPLACEMENT       LUMBAR  WOUND DEBRIDEMENT N/A 01/24/2019    Procedure: LUMBAR WOUND Exploration for Evacation of Seroma vs. Hematoma;  Surgeon: Shirlean Kelly, MD;  Location: [facility] OR;  Service: Neurosurgery;  Laterality: N/A;   MELANOMA EXCISION        right leg, seen at [facility] and underwent immunotherapy   NECK SURGERY         x 1 yrs ago   PARS PLANA VITRECTOMY Left 04/04/2020    Procedure: PARS PLANA VITRECTOMY WITH 25G REMOVAL/SUTURE INTRAOCULAR LENS;  Surgeon: Rennis Chris, MD;  Location: [facility] OR;  Service: Ophthalmology;  Laterality: Left;   REPLACEMENT TOTAL KNEE Right     TRANSCAROTID ARTERY REVASCULARIZATION  Right 03/25/2022    Procedure: Right Transcarotid Artery Revascularization;  Surgeon: Nada Libman, MD;  Location: [facility] OR;  Service: Vascular;  Laterality: Right;   WISDOM TOOTH EXTRACTION                   Family History  Problem Relation Age of Onset   Heart disease Mother     Heart attack Mother     Colon polyps Mother     Colon polyps Brother     Heart attack Maternal Grandmother     Heart attack Maternal Grandfather     Colon cancer Neg Hx            Social History  Social History          Tobacco Use   Smoking status: Former      Current packs/day: 0.00      Average packs/day: 1.5 packs/day for 40.0 years (60.0 ttl pk-yrs)      Types: Cigarettes      Start date: 11/04/1963      Quit date: 10/29/2003      Years since quitting: 19.9      Passive exposure: Never   Smokeless tobacco: Never  Vaping Use   Vaping status: Never Used  Substance Use Topics   Alcohol use: Yes      Alcohol/week: 15.0 standard drinks of alcohol      Types: 14 Cans of beer, 1 Standard drinks or equivalent per week      Comment: 2 beers or a mixed drink   Drug use: No        Medications: I have reviewed the patient's current medications. Allergies as of 09/21/2023         Reactions    Erythromycin Swelling    Lip swelling    Shellfish Allergy Rash, Other (See Comments)    Gout flares    Nitrofuran Derivatives Swelling    Nitrostat [nitroglycerin] Other (See Comments)    Severe hypotension    Zithromax [azithromycin] Swelling            Medication List           Accurate as of September 21, 2023  9:30 AM. If you have any questions, ask your nurse or doctor.              STOP taking these medications     predniSONE 10 MG tablet Commonly known as: DELTASONE Stopped by: Lucretia Roers           TAKE these medications     acetaminophen 500 MG tablet Commonly known as: TYLENOL Take 1,000 mg by mouth daily as needed for moderate pain, fever or headache.    albuterol 108 (90 Base) MCG/ACT inhaler Commonly known as: VENTOLIN HFA Inhale 2 puffs into the lungs every 6 (six)  hours as needed for wheezing or shortness of breath.    aspirin EC 81 MG tablet Take 81 mg by mouth daily.    atorvastatin 20 MG tablet Commonly known as: LIPITOR TAKE 1 TABLET BY MOUTH DAILY    clopidogrel 75 MG tablet Commonly known as: PLAVIX TAKE 1 TABLET BY MOUTH DAILY    dapagliflozin propanediol 10 MG Tabs tablet Commonly known as: Farxiga Take 1 tablet (10 mg total) by mouth daily  before breakfast.    furosemide 20 MG tablet Commonly known as: LASIX Take 1 tablet (20 mg total) by mouth every other day.    guaiFENesin-dextromethorphan 100-10 MG/5ML syrup Commonly known as: ROBITUSSIN DM Take 5 mLs by mouth every 4 (four) hours as needed for cough (chest congestion).    ipratropium-albuterol 0.5-2.5 (3) MG/3ML Soln Commonly known as: DUONEB Take 3 mLs by nebulization every 6 (six) hours as needed.    latanoprost 0.005 % ophthalmic solution Commonly known as: XALATAN Place 1 drop into the left eye at bedtime.    levothyroxine 175 MCG tablet Commonly known as: SYNTHROID Take 175 mcg by mouth See admin instructions. 175 mcg every 3rd day.    levothyroxine 150 MCG tablet Commonly known as: SYNTHROID Take 150 mcg by mouth See admin instructions. 150 mcg once daily for 2 days, every 3rd day - repeat.    lisinopril 40 MG tablet Commonly known as: ZESTRIL Take 40 mg by mouth daily. What changed: Another medication with the same name was removed. Continue taking this medication, and follow the directions you see here. Changed by: Lucretia Roers    prednisoLONE acetate 1 % ophthalmic suspension Commonly known as: PRED FORTE Place 1 drop into the left eye daily.    Prolensa 0.07 % Soln Generic drug: Bromfenac Sodium Place 1 drop into the left eye daily.    tamsulosin 0.4 MG Caps capsule Commonly known as: FLOMAX Take 0.8 mg by mouth at bedtime.    Trelegy Ellipta 100-62.5-25 MCG/ACT Aepb Generic drug: Fluticasone-Umeclidin-Vilant Inhale 1 puff into the lungs daily.               ROS:  A comprehensive review of systems was negative except for: Cardiovascular: positive for HTN Gastrointestinal: positive for hernia umbilical region Genitourinary: positive for frequency Musculoskeletal: positive for back pain, neck pain, and joint pain Endocrine: positive for diabetes   Blood pressure 120/73, pulse (!) 59, temperature 97.8 F (36.6 C),  temperature source Oral, resp. rate 16, height 5\' 8"  (1.727 m), weight 177 lb (80.3 kg), SpO2 94%. Physical Exam Vitals reviewed.  HENT:     Head: Normocephalic.     Nose: Nose normal.  Eyes:     Extraocular Movements: Extraocular movements intact.  Cardiovascular:     Rate and Rhythm: Normal rate and regular rhythm.  Pulmonary:     Effort: Pulmonary effort is normal.     Breath sounds: Normal breath sounds.  Abdominal:     General: There is no distension.     Palpations: Abdomen is soft.     Tenderness: There is abdominal tenderness. Negative signs include obturator sign.     Hernia: A hernia is present. Hernia is present in the umbilical area.     Comments: Reducible 1-2 cm defect  Musculoskeletal:        General: Normal range of motion.  Skin:    General: Skin is warm.  Neurological:     General: No focal deficit present.     Mental Status: He  is alert and oriented to person, place, and time.  Psychiatric:        Mood and Affect: Mood normal.        Behavior: Behavior normal.        Thought Content: Thought content normal.        Results: CT 2023 defect about 2cm at that time    Assessment & Plan:  KAHRON KAUTH is a 77 y.o. male with an umbilical hernia. He had some type of reaction after his elbow surgery. I have reviewed the chart and I see he had surgery in 2022 with Dr. Melvyn Novas 3 times but I cannot see operative notes or specifics about any reaction to suture etc. I have sent Dr. Melvyn Novas a message regarding this reaction to see if we can get more specifics.   I had discussed with the patient that he uses [place] for both DM and CHF and that we would need Cardiology to say it was ok that he come off the [place]. It sounds like he takes the plavix possibly for his Carotid stent and for prior TIA.   We will make sure he can come off Farxiga and plavix prior to surgery.    We discussed options for surgery and plan for mesh and risk of bleeding, infection, injury to  bowel, recurrence. I think with the defect being smaller and his history that an open hernia repair would be the best option. Discussed that we will decide exactly what we will do after we figure out what "reaction" he had with Dr. Melvyn Novas.         Future Appointments  Date Time Provider Department Center  10/05/2023  9:20 AM [facility] PRE OP CLEARANCE APP [facility] None  10/14/2023 11:30 AM Gwenith Daily, RN [facility] None  05/02/2024  8:00 AM Rennis Chris, MD [facility] None      [facility]   Dr. Melvyn Novas let me know that the patient had a reaction to fiberwire suture by arthrex. This is made of polyester. We would want to avoid polyester materials.    We could still fix this with mesh since this is polyprolene and I could use absorbable Vicryl or permanent Novafil (polyglycol and polybutylene terephthalate).    Given the small defect 1-2 cm, I think an open repair with mesh is the best option. We will avoid any polyester material.    Discussed this with the patient.    He is talking with cardiology 10/8 for cardiac risk.    Based on the telephone notes it sounds like vascular prescribes his plavix, and cardiology wanted [name] to verify with them that it was ok to hold.   Can get him on schedule after his 10/8 telephone appt if all is well by cardiology.    [name], MD [facility] [address] [place], [place] [address] [phone] (office)       Lucretia Roers 09/21/2023, 9:30 AM

## 2023-10-18 NOTE — Patient Instructions (Signed)
Christopher Randolph  10/18/2023     @PREFPERIOPPHARMACY @   Your procedure is scheduled on  10/22/2023.   Report to [facility] at  0600  A.M.   Call this number if you have problems the morning of surgery:  [phone]  If you experience any cold or flu symptoms such as cough, fever, chills, shortness of breath, etc. between now and your scheduled surgery, please notify us at the above number.   Remember:  Do not eat after midnight.   You may drink clear liquids until 0330 am on 10/22/2023.     Clear liquids allowed are:                    Water, Carbonated beverages (diabetics please choose diet or no sugar options), Black Coffee Only (No creamer, milk or cream, including half & half and powdered creamer), and Clear Sports drink (No red color; diabetics please choose diet or no sugar options)       Use your nebulizer before you come and bring your rescue inhaler with you.    Take these medicines the morning of surgery with A SIP OF WATER                                           levothyroxine.    Do not wear jewelry, make-up or nail polish, including gel polish,  artificial nails, or any other type of covering on natural nails (fingers and  toes).  Do not wear lotions, powders, or perfumes, or deodorant.  Do not shave 48 hours prior to surgery.  Men may shave face and neck.  Do not bring valuables to the hospital.  [facility] is not responsible for any belongings or valuables.  Contacts, dentures or bridgework may not be worn into surgery.  Leave your suitcase in the car.  After surgery it may be brought to your room.  For patients admitted to the hospital, discharge time will be determined by your treatment team.  Patients discharged the day of surgery will not be allowed to drive home and must have someone with them for 24 hours.    Special instructions:   DO NOT smoke tobacco or vape for 24 hours before your procedure.  Please read over the following  fact sheets that you were given. Coughing and Deep Breathing, Surgical Site Infection Prevention, Anesthesia Post-op Instructions, and Care and Recovery After Surgery      Open Hernia Repair, Adult, Care After What can I expect after the procedure? After the procedure, it is common to have: Mild discomfort. Slight bruising. Mild swelling. Pain in the belly (abdomen). A small amount of blood from the cut from surgery (incision). Follow these instructions at home: Your doctor may give you more specific instructions. If you have problems, call your doctor. Medicines Take over-the-counter and prescription medicines only as told by your doctor. If told, take steps to prevent problems with pooping (constipation). You may need to: Drink enough fluid to keep your pee (urine) pale yellow. Take medicines. You will be told what medicines to take. Eat foods that are high in fiber. These include beans, whole grains, and fresh fruits and vegetables. Limit foods that are high in fat and sugar. These include fried or sweet foods. Ask your doctor if you should avoid driving or using machines while you  are taking your medicine. Incision care  Follow instructions from your doctor about how to take care of your incision. Make sure you: Wash your hands with soap and water for at least 20 seconds before and after you change your bandage (dressing). If you cannot use soap and water, use hand sanitizer. Change your bandage. Leave stitches or skin glue in place for at least 2 weeks. Leave tape strips alone unless you are told to take them off. You may trim the edges of the tape strips if they curl up. Check your incision every day for signs of infection. Check for: More redness, swelling, or pain. More fluid or blood. Warmth. Pus or a bad smell. Wear loose, soft clothing while your incision heals. Activity  Rest as told by your doctor. Do not lift anything that is heavier than 10 lb (4.5 kg), or the  limit that you are told. Do not play contact sports until your doctor says that this is safe. If you were given a sedative during your procedure, do not drive or use machines until your doctor says that it is safe. A sedative is a medicine that helps you relax. Return to your normal activities when your doctor says that it is safe. General instructions Do not take baths, swim, or use a hot tub. Ask your doctor about taking showers or sponge baths. Hold a pillow over your belly when you cough or sneeze. This helps with pain. Do not smoke or use any products that contain nicotine or tobacco. If you need help quitting, ask your doctor. Keep all follow-up visits. Contact a doctor if: You have any of these signs of infection in or around your incision: More redness, swelling, or pain. More fluid or blood. Warmth. Pus. A bad smell. You have a fever or chills. You have blood in your poop (stool). You have not pooped (had a bowel movement) in 2-3 days. Medicine does not help your pain. Get help right away if: You have chest pain, or you are short of breath. You feel faint or light-headed. You have very bad pain. You vomit and your pain is worse. You have pain, swelling, or redness in a leg. These symptoms may be an emergency. Get help right away. Call your local emergency services (911 in the [place] Do not wait to see if the symptoms will go away. Do not drive yourself to the hospital. Summary After this procedure, it is common to have mild discomfort, slight bruising, and mild swelling. Follow instructions from your doctor about how to take care of your cut from surgery (incision). Check every day for signs of infection. Do not lift heavy objects or play contact sports until your doctor says it is safe. Return to your normal activities as told by your doctor. This information is not intended to replace advice given to you by your health care provider. Make sure you discuss any questions  you have with your health care provider. Document Revised: 07/29/2020 Document Reviewed: 07/29/2020 Elsevier Patient Education  2024 [employer] General Anesthesia, Adult, Care After The following information offers guidance on how to care for yourself after your procedure. Your health care provider may also give you more specific instructions. If you have problems or questions, contact your health care provider. What can I expect after the procedure? After the procedure, it is common for people to: Have pain or discomfort at the IV site. Have nausea or vomiting. Have a sore throat or hoarseness. Have trouble concentrating. Feel cold  or chills. Feel weak, sleepy, or tired (fatigue). Have soreness and body aches. These can affect parts of the body that were not involved in surgery. Follow these instructions at home: For the time period you were told by your health care provider:  Rest. Do not participate in activities where you could fall or become injured. Do not drive or use machinery. Do not drink alcohol. Do not take sleeping pills or medicines that cause drowsiness. Do not make important decisions or sign legal documents. Do not take care of children on your own. General instructions Drink enough fluid to keep your urine pale yellow. If you have sleep apnea, surgery and certain medicines can increase your risk for breathing problems. Follow instructions from your health care provider about wearing your sleep device: Anytime you are sleeping, including during daytime naps. While taking prescription pain medicines, sleeping medicines, or medicines that make you drowsy. Return to your normal activities as told by your health care provider. Ask your health care provider what activities are safe for you. Take over-the-counter and prescription medicines only as told by your health care provider. Do not use any products that contain nicotine or tobacco. These products include  cigarettes, chewing tobacco, and vaping devices, such as e-cigarettes. These can delay incision healing after surgery. If you need help quitting, ask your health care provider. Contact a health care provider if: You have nausea or vomiting that does not get better with medicine. You vomit every time you eat or drink. You have pain that does not get better with medicine. You cannot urinate or have bloody urine. You develop a skin rash. You have a fever. Get help right away if: You have trouble breathing. You have chest pain. You vomit blood. These symptoms may be an emergency. Get help right away. Call 911. Do not wait to see if the symptoms will go away. Do not drive yourself to the hospital. Summary After the procedure, it is common to have a sore throat, hoarseness, nausea, vomiting, or to feel weak, sleepy, or fatigue. For the time period you were told by your health care provider, do not drive or use machinery. Get help right away if you have difficulty breathing, have chest pain, or vomit blood. These symptoms may be an emergency. This information is not intended to replace advice given to you by your health care provider. Make sure you discuss any questions you have with your health care provider. Document Revised: 03/13/2022 Document Reviewed: 03/13/2022 Elsevier Patient Education  2024 [employer]. How to Use Chlorhexidine Before Surgery Chlorhexidine gluconate (CHG) is a germ-killing (antiseptic) solution that is used to clean the skin. It can get rid of the bacteria that normally live on the skin and can keep them away for about 24 hours. To clean your skin with CHG, you may be given: A CHG solution to use in the shower or as part of a sponge bath. A prepackaged cloth that contains CHG. Cleaning your skin with CHG may help lower the risk for infection: While you are staying in the intensive care unit of the hospital. If you have a vascular access, such as a central line, to  provide short-term or long-term access to your veins. If you have a catheter to drain urine from your bladder. If you are on a ventilator. A ventilator is a machine that helps you breathe by moving air in and out of your lungs. After surgery. What are the risks? Risks of using CHG include: A skin reaction.  Hearing loss, if CHG gets in your ears and you have a perforated eardrum. Eye injury, if CHG gets in your eyes and is not rinsed out. The CHG product catching fire. Make sure that you avoid smoking and flames after applying CHG to your skin. Do not use CHG: If you have a chlorhexidine allergy or have previously reacted to chlorhexidine. On babies younger than 8 months of age. How to use CHG solution Use CHG only as told by your health care provider, and follow the instructions on the label. Use the full amount of CHG as directed. Usually, this is one bottle. During a shower Follow these steps when using CHG solution during a shower (unless your health care provider gives you different instructions): Start the shower. Use your normal soap and shampoo to wash your face and hair. Turn off the shower or move out of the shower stream. Pour the CHG onto a clean washcloth. Do not use any type of brush or rough-edged sponge. Starting at your neck, lather your body down to your toes. Make sure you follow these instructions: If you will be having surgery, pay special attention to the part of your body where you will be having surgery. Scrub this area for at least 1 minute. Do not use CHG on your head or face. If the solution gets into your ears or eyes, rinse them well with water. Avoid your genital area. Avoid any areas of skin that have broken skin, cuts, or scrapes. Scrub your back and under your arms. Make sure to wash skin folds. Let the lather sit on your skin for 1-2 minutes or as long as told by your health care provider. Thoroughly rinse your entire body in the shower. Make sure that  all body creases and crevices are rinsed well. Dry off with a clean towel. Do not put any substances on your body afterward--such as powder, lotion, or perfume--unless you are told to do so by your health care provider. Only use lotions that are recommended by the manufacturer. Put on clean clothes or pajamas. If it is the night before your surgery, sleep in clean sheets.  During a sponge bath Follow these steps when using CHG solution during a sponge bath (unless your health care provider gives you different instructions): Use your normal soap and shampoo to wash your face and hair. Pour the CHG onto a clean washcloth. Starting at your neck, lather your body down to your toes. Make sure you follow these instructions: If you will be having surgery, pay special attention to the part of your body where you will be having surgery. Scrub this area for at least 1 minute. Do not use CHG on your head or face. If the solution gets into your ears or eyes, rinse them well with water. Avoid your genital area. Avoid any areas of skin that have broken skin, cuts, or scrapes. Scrub your back and under your arms. Make sure to wash skin folds. Let the lather sit on your skin for 1-2 minutes or as long as told by your health care provider. Using a different clean, wet washcloth, thoroughly rinse your entire body. Make sure that all body creases and crevices are rinsed well. Dry off with a clean towel. Do not put any substances on your body afterward--such as powder, lotion, or perfume--unless you are told to do so by your health care provider. Only use lotions that are recommended by the manufacturer. Put on clean clothes or pajamas. If it is the  night before your surgery, sleep in clean sheets. How to use CHG prepackaged cloths Only use CHG cloths as told by your health care provider, and follow the instructions on the label. Use the CHG cloth on clean, dry skin. Do not use the CHG cloth on your head or face  unless your health care provider tells you to. When washing with the CHG cloth: Avoid your genital area. Avoid any areas of skin that have broken skin, cuts, or scrapes. Before surgery Follow these steps when using a CHG cloth to clean before surgery (unless your health care provider gives you different instructions): Using the CHG cloth, vigorously scrub the part of your body where you will be having surgery. Scrub using a back-and-forth motion for 3 minutes. The area on your body should be completely wet with CHG when you are done scrubbing. Do not rinse. Discard the cloth and let the area air-dry. Do not put any substances on the area afterward, such as powder, lotion, or perfume. Put on clean clothes or pajamas. If it is the night before your surgery, sleep in clean sheets.  For general bathing Follow these steps when using CHG cloths for general bathing (unless your health care provider gives you different instructions). Use a separate CHG cloth for each area of your body. Make sure you wash between any folds of skin and between your fingers and toes. Wash your body in the following order, switching to a new cloth after each step: The front of your neck, shoulders, and chest. Both of your arms, under your arms, and your hands. Your stomach and groin area, avoiding the genitals. Your right leg and foot. Your left leg and foot. The back of your neck, your back, and your buttocks. Do not rinse. Discard the cloth and let the area air-dry. Do not put any substances on your body afterward--such as powder, lotion, or perfume--unless you are told to do so by your health care provider. Only use lotions that are recommended by the manufacturer. Put on clean clothes or pajamas. Contact a health care provider if: Your skin gets irritated after scrubbing. You have questions about using your solution or cloth. You swallow any chlorhexidine. Call your local poison control center ([phone] in the  [place] Get help right away if: Your eyes itch badly, or they become very red or swollen. Your skin itches badly and is red or swollen. Your hearing changes. You have trouble seeing. You have swelling or tingling in your mouth or throat. You have trouble breathing. These symptoms may represent a serious problem that is an emergency. Do not wait to see if the symptoms will go away. Get medical help right away. Call your local emergency services (911 in the [place] Do not drive yourself to the hospital. Summary Chlorhexidine gluconate (CHG) is a germ-killing (antiseptic) solution that is used to clean the skin. Cleaning your skin with CHG may help to lower your risk for infection. You may be given CHG to use for bathing. It may be in a bottle or in a prepackaged cloth to use on your skin. Carefully follow your health care provider's instructions and the instructions on the product label. Do not use CHG if you have a chlorhexidine allergy. Contact your health care provider if your skin gets irritated after scrubbing. This information is not intended to replace advice given to you by your health care provider. Make sure you discuss any questions you have with your health care provider. Document Revised: 04/13/2022 Document Reviewed:  02/24/2021 Elsevier Patient Education  2023 [employer].

## 2023-10-19 ENCOUNTER — Encounter (HOSPITAL_COMMUNITY): Payer: Self-pay

## 2023-10-19 ENCOUNTER — Encounter (HOSPITAL_COMMUNITY)
Admission: RE | Admit: 2023-10-19 | Discharge: 2023-10-19 | Disposition: A | Discharge status: Home / Self Care | Payer: Medicare Other | Source: Ambulatory Visit | Attending: General Surgery | Admitting: General Surgery

## 2023-10-19 VITALS — BP 99/63 | HR 63 | Temp 97.8°F | Resp 18 | Ht 68.0 in | Wt 177.0 lb

## 2023-10-19 DIAGNOSIS — E1169 Type 2 diabetes mellitus with other specified complication: Secondary | ICD-10-CM

## 2023-10-19 DIAGNOSIS — Z01818 Encounter for other preprocedural examination: Secondary | ICD-10-CM | POA: Diagnosis not present

## 2023-10-19 DIAGNOSIS — I509 Heart failure, unspecified: Secondary | ICD-10-CM

## 2023-10-19 DIAGNOSIS — Z01812 Encounter for preprocedural laboratory examination: Secondary | ICD-10-CM | POA: Diagnosis present

## 2023-10-19 DIAGNOSIS — Z01810 Encounter for preprocedural cardiovascular examination: Secondary | ICD-10-CM | POA: Diagnosis present

## 2023-10-19 DIAGNOSIS — I110 Hypertensive heart disease with heart failure: Secondary | ICD-10-CM | POA: Diagnosis not present

## 2023-10-19 DIAGNOSIS — I10 Essential (primary) hypertension: Secondary | ICD-10-CM

## 2023-10-19 HISTORY — DX: Cerebral infarction, unspecified: I63.9

## 2023-10-19 HISTORY — DX: Heart failure, unspecified: I50.9

## 2023-10-19 LAB — BASIC METABOLIC PANEL
Anion gap: 11 (ref 5–15)
BUN: 22 mg/dL (ref 8–23)
CO2: 24 mmol/L (ref 22–32)
Calcium: 9 mg/dL (ref 8.9–10.3)
Chloride: 104 mmol/L (ref 98–111)
Creatinine, Ser: 1.12 mg/dL (ref 0.61–1.24)
GFR, Estimated: >60 mL/min (ref >60)
Glucose, Bld: 103 mg/dL — ABNORMAL HIGH (ref 70–99)
Potassium: 3.9 mmol/L (ref 3.5–5.1)
Sodium: 139 mmol/L (ref 135–145)

## 2023-10-19 LAB — HEMOGLOBIN A1C
Hgb A1c MFr Bld: 6.6 % — ABNORMAL HIGH (ref 4.8–5.6)
Mean Plasma Glucose: 142.72 mg/dL

## 2023-10-19 NOTE — Telephone Encounter (Signed)
Rx renewed via vmail request [place]

## 2023-10-21 NOTE — Anesthesia Preprocedure Evaluation (Addendum)
Anesthesia Evaluation  Patient identified by MRN, date of birth, ID band Patient awake    Reviewed: Allergy & Precautions, NPO status , Patient's Chart, lab work & pertinent test results, reviewed documented beta blocker date and time   History of Anesthesia Complications (+) PONV and history of anesthetic complications  Airway Mallampati: II  TM Distance: >3 FB Neck ROM: Full    Dental  (+) Dental Advisory Given, Edentulous Upper, Partial Lower, Poor Dentition   Pulmonary COPD, former smoker Stop bang 3   Pulmonary exam normal breath sounds clear to auscultation       Cardiovascular hypertension, Pt. on medications + CAD, + Peripheral Vascular Disease and +CHF  Normal cardiovascular exam+ Valvular Problems/Murmurs  Rhythm:Regular Rate:Normal + Systolic murmurs Echo 02/2022 1. Left ventricular ejection fraction, by estimation, is 60 to 65%. The left ventricle has normal function. The left ventricle has no regional wall motion abnormalities. Left ventricular diastolic parameters are consistent with Grade II diastolic dysfunction (pseudonormalization). Elevated left atrial pressure.  2. Right ventricular systolic function is normal. The right ventricular size is normal. There is normal pulmonary artery systolic pressure. The estimated right ventricular systolic pressure is 25.7 mmHg.  3. Left atrial size was mildly dilated.  4. The mitral valve is normal in structure. No evidence of mitral valve regurgitation. No evidence of mitral stenosis.  5. The aortic valve is tricuspid. There is mild calcification of the aortic valve. Aortic valve regurgitation is not visualized. Aortic valve sclerosis is present, with no evidence of aortic valve stenosis.  6. The inferior vena cava is normal in size with greater than 50% respiratory variability, suggesting right atrial pressure of 3 mmHg.    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 Lyme disease.  AAA  Reproductive/Obstetrics                             Anesthesia Physical Anesthesia Plan  ASA: 3  Anesthesia Plan: General   Post-op Pain Management:    Induction: Intravenous  PONV Risk Score and Plan: 3 and Treatment may vary due to age or medical condition, Ondansetron and Dexamethasone  Airway Management Planned: Oral ETT  Additional Equipment: None  Intra-op Plan:   Post-operative Plan: Extubation in OR  Informed Consent: I have reviewed the patients History and Physical, chart, labs and discussed the procedure including the risks, benefits and alternatives for the proposed anesthesia with the patient or authorized representative who has indicated his/her understanding and acceptance.     Dental advisory given  Plan Discussed with: CRNA  Anesthesia Plan Comments: (Remi gtt)        Anesthesia Quick Evaluation

## 2023-10-22 ENCOUNTER — Ambulatory Visit (HOSPITAL_BASED_OUTPATIENT_CLINIC_OR_DEPARTMENT_OTHER): Payer: Medicare Other | Admitting: Anesthesiology

## 2023-10-22 ENCOUNTER — Encounter (HOSPITAL_COMMUNITY): Payer: Self-pay | Admitting: General Surgery

## 2023-10-22 ENCOUNTER — Encounter (HOSPITAL_COMMUNITY)
Admission: RE | Disposition: A | Discharge status: Home / Self Care | Payer: Self-pay | Source: Home / Self Care | Attending: General Surgery

## 2023-10-22 ENCOUNTER — Ambulatory Visit (HOSPITAL_COMMUNITY)
Admission: RE | Admit: 2023-10-22 | Discharge: 2023-10-22 | Disposition: A | Discharge status: Home / Self Care | Payer: Medicare Other | Attending: General Surgery | Admitting: General Surgery

## 2023-10-22 ENCOUNTER — Ambulatory Visit (HOSPITAL_COMMUNITY): Payer: Medicare Other | Admitting: Anesthesiology

## 2023-10-22 DIAGNOSIS — Z85820 Personal history of malignant melanoma of skin: Secondary | ICD-10-CM | POA: Insufficient documentation

## 2023-10-22 DIAGNOSIS — Z87891 Personal history of nicotine dependence: Secondary | ICD-10-CM | POA: Insufficient documentation

## 2023-10-22 DIAGNOSIS — K429 Umbilical hernia without obstruction or gangrene: Secondary | ICD-10-CM | POA: Insufficient documentation

## 2023-10-22 DIAGNOSIS — J449 Chronic obstructive pulmonary disease, unspecified: Secondary | ICD-10-CM | POA: Diagnosis not present

## 2023-10-22 DIAGNOSIS — Z7902 Long term (current) use of antithrombotics/antiplatelets: Secondary | ICD-10-CM | POA: Insufficient documentation

## 2023-10-22 DIAGNOSIS — I509 Heart failure, unspecified: Secondary | ICD-10-CM | POA: Insufficient documentation

## 2023-10-22 DIAGNOSIS — I110 Hypertensive heart disease with heart failure: Secondary | ICD-10-CM | POA: Diagnosis not present

## 2023-10-22 DIAGNOSIS — E1151 Type 2 diabetes mellitus with diabetic peripheral angiopathy without gangrene: Secondary | ICD-10-CM | POA: Insufficient documentation

## 2023-10-22 DIAGNOSIS — I2510 Atherosclerotic heart disease of native coronary artery without angina pectoris: Secondary | ICD-10-CM

## 2023-10-22 DIAGNOSIS — Z7984 Long term (current) use of oral hypoglycemic drugs: Secondary | ICD-10-CM | POA: Diagnosis not present

## 2023-10-22 HISTORY — PX: UMBILICAL HERNIA REPAIR: SHX196

## 2023-10-22 LAB — GLUCOSE, CAPILLARY
Glucose-Capillary: 151 mg/dL — ABNORMAL HIGH (ref 70–99)
Glucose-Capillary: 149 mg/dL — ABNORMAL HIGH (ref 70–99)

## 2023-10-22 SURGERY — REPAIR, HERNIA, UMBILICAL, ADULT
Anesthesia: General | Site: Abdomen

## 2023-10-22 MED ORDER — OXYCODONE HCL 5 MG PO TABS: 5.0000 mg | ORAL_TABLET | ORAL | 0 refills | Status: DC | PRN

## 2023-10-22 MED ORDER — PROPOFOL 10 MG/ML IV BOLUS
INTRAVENOUS | Status: AC
  Filled 2023-10-22: qty 20

## 2023-10-22 MED ORDER — ONDANSETRON HCL 4 MG PO TABS: 4.0000 mg | ORAL_TABLET | Freq: Three times a day (TID) | ORAL | 1 refills | Status: DC | PRN

## 2023-10-22 MED ORDER — SODIUM CHLORIDE 0.9 % IR SOLN
Status: DC | PRN
  Administered 2023-10-22: 1000 mL

## 2023-10-22 MED ORDER — CHLORHEXIDINE GLUCONATE 0.12 % MT SOLN: 15.0000 mL | Freq: Once | OROMUCOSAL | Status: AC

## 2023-10-22 MED ORDER — PHENYLEPHRINE HCL-NACL 20-0.9 MG/250ML-% IV SOLN
INTRAVENOUS | Status: DC | PRN
  Administered 2023-10-22 (×2): 160 ug via INTRAVENOUS
  Administered 2023-10-22: 80 ug via INTRAVENOUS
  Administered 2023-10-22: 160 ug via INTRAVENOUS
  Administered 2023-10-22: 240 ug via INTRAVENOUS

## 2023-10-22 MED ORDER — OXYCODONE HCL 5 MG/5ML PO SOLN: 5.0000 mg | Freq: Once | ORAL | Status: DC | PRN

## 2023-10-22 MED ORDER — CLOPIDOGREL BISULFATE 75 MG PO TABS: 75.0000 mg | ORAL_TABLET | Freq: Every day | ORAL | Status: DC

## 2023-10-22 MED ORDER — FENTANYL CITRATE (PF) 100 MCG/2ML IJ SOLN
INTRAMUSCULAR | Status: AC
  Filled 2023-10-22: qty 2

## 2023-10-22 MED ORDER — FENTANYL CITRATE (PF) 250 MCG/5ML IJ SOLN
INTRAMUSCULAR | Status: DC | PRN
  Administered 2023-10-22: 100 ug via INTRAVENOUS

## 2023-10-22 MED ORDER — CHLORHEXIDINE GLUCONATE CLOTH 2 % EX PADS
6.0000 | MEDICATED_PAD | Freq: Once | CUTANEOUS | Status: AC
  Administered 2023-10-22: 6 via TOPICAL

## 2023-10-22 MED ORDER — SCOPOLAMINE 1 MG/3DAYS TD PT72: 1.0000 | MEDICATED_PATCH | Freq: Once | TRANSDERMAL | Status: DC

## 2023-10-22 MED ORDER — ONDANSETRON HCL 4 MG/2ML IJ SOLN
INTRAMUSCULAR | Status: AC
  Filled 2023-10-22: qty 2

## 2023-10-22 MED ORDER — CHLORHEXIDINE GLUCONATE CLOTH 2 % EX PADS: 6.0000 | MEDICATED_PAD | Freq: Once | CUTANEOUS | Status: DC

## 2023-10-22 MED ORDER — ONDANSETRON HCL 4 MG/2ML IJ SOLN
INTRAMUSCULAR | Status: DC | PRN
  Administered 2023-10-22: 4 mg via INTRAVENOUS

## 2023-10-22 MED ORDER — LACTATED RINGERS IV SOLN: INTRAVENOUS | Status: DC | PRN

## 2023-10-22 MED ORDER — LIDOCAINE HCL (PF) 2 % IJ SOLN
INTRAMUSCULAR | Status: AC
  Filled 2023-10-22: qty 5

## 2023-10-22 MED ORDER — PROPOFOL 10 MG/ML IV BOLUS
INTRAVENOUS | Status: DC | PRN
  Administered 2023-10-22: 150 mg via INTRAVENOUS

## 2023-10-22 MED ORDER — BUPIVACAINE HCL (PF) 0.5 % IJ SOLN
INTRAMUSCULAR | Status: DC | PRN
  Administered 2023-10-22: 30 mL

## 2023-10-22 MED ORDER — CEFAZOLIN SODIUM-DEXTROSE 2-4 GM/100ML-% IV SOLN
2.0000 g | INTRAVENOUS | Status: AC
  Administered 2023-10-22: 2 g via INTRAVENOUS

## 2023-10-22 MED ORDER — SCOPOLAMINE 1 MG/3DAYS TD PT72
MEDICATED_PATCH | TRANSDERMAL | Status: AC
  Administered 2023-10-22: 1.5 mg via TRANSDERMAL
  Filled 2023-10-22: qty 1

## 2023-10-22 MED ORDER — ROCURONIUM BROMIDE 10 MG/ML (PF) SYRINGE
PREFILLED_SYRINGE | INTRAVENOUS | Status: DC | PRN
  Administered 2023-10-22: 50 mg via INTRAVENOUS

## 2023-10-22 MED ORDER — DAPAGLIFLOZIN PROPANEDIOL 10 MG PO TABS: 10.0000 mg | ORAL_TABLET | Freq: Every day | ORAL | Status: DC

## 2023-10-22 MED ORDER — LIDOCAINE 2% (20 MG/ML) 5 ML SYRINGE
INTRAMUSCULAR | Status: DC | PRN
  Administered 2023-10-22: 80 mg via INTRAVENOUS

## 2023-10-22 MED ORDER — CEFAZOLIN SODIUM-DEXTROSE 2-4 GM/100ML-% IV SOLN
INTRAVENOUS | Status: AC
  Filled 2023-10-22: qty 100

## 2023-10-22 MED ORDER — SUGAMMADEX SODIUM 200 MG/2ML IV SOLN
INTRAVENOUS | Status: DC | PRN
  Administered 2023-10-22: 160.6 mg via INTRAVENOUS

## 2023-10-22 MED ORDER — FENTANYL CITRATE PF 50 MCG/ML IJ SOSY: 25.0000 ug | PREFILLED_SYRINGE | INTRAMUSCULAR | Status: DC | PRN

## 2023-10-22 MED ORDER — ORAL CARE MOUTH RINSE
15.0000 mL | Freq: Once | OROMUCOSAL | Status: AC
  Administered 2023-10-22: 15 mL via OROMUCOSAL

## 2023-10-22 MED ORDER — OXYCODONE HCL 5 MG PO TABS: 5.0000 mg | ORAL_TABLET | Freq: Once | ORAL | Status: DC | PRN

## 2023-10-22 MED ORDER — ONDANSETRON HCL 4 MG/2ML IJ SOLN: 4.0000 mg | Freq: Once | INTRAMUSCULAR | Status: DC | PRN

## 2023-10-22 MED ORDER — BUPIVACAINE HCL (PF) 0.5 % IJ SOLN
INTRAMUSCULAR | Status: AC
  Filled 2023-10-22: qty 30

## 2023-10-22 MED ORDER — ROCURONIUM BROMIDE 10 MG/ML (PF) SYRINGE
PREFILLED_SYRINGE | INTRAVENOUS | Status: AC
  Filled 2023-10-22: qty 10

## 2023-10-22 SURGICAL SUPPLY — 39 items
ADH SKN CLS APL DERMABOND .7 (GAUZE/BANDAGES/DRESSINGS) ×1
APL PRP STRL LF DISP 70% ISPRP (MISCELLANEOUS) ×1
BLADE SURG 15 STRL LF DISP TIS (BLADE) ×2 IMPLANT
BLADE SURG 15 STRL SS (BLADE) ×1
CHLORAPREP W/TINT 26 (MISCELLANEOUS) ×2 IMPLANT
CLOTH BEACON ORANGE TIMEOUT ST (SAFETY) ×2 IMPLANT
COVER LIGHT HANDLE (MISCELLANEOUS) IMPLANT
DERMABOND ADVANCED .7 DNX12 (GAUZE/BANDAGES/DRESSINGS) ×2 IMPLANT
DRSG TEGADERM 2-3/8X2-3/4 SM (GAUZE/BANDAGES/DRESSINGS) IMPLANT
ELECT REM PT RETURN 9FT ADLT (ELECTROSURGICAL) ×1
ELECTRODE REM PT RTRN 9FT ADLT (ELECTROSURGICAL) ×2 IMPLANT
GAUZE 4X4 16PLY ~~LOC~~+RFID DBL (SPONGE) ×2 IMPLANT
GAUZE SPONGE 2X2 STRL 8-PLY (GAUZE/BANDAGES/DRESSINGS) IMPLANT
GLOVE BIO SURGEON STRL SZ 6.5 (GLOVE) ×2 IMPLANT
GLOVE BIOGEL PI IND STRL 6.5 (GLOVE) ×2 IMPLANT
GLOVE BIOGEL PI IND STRL 7.0 (GLOVE) ×4 IMPLANT
GLOVE BIOGEL PI IND STRL 7.5 (GLOVE) IMPLANT
GLOVE BIOGEL PI IND STRL 8 (GLOVE) IMPLANT
GLOVE SURG SS PI 8.0 STRL IVOR (GLOVE) IMPLANT
GOWN STRL REUS W/TWL LRG LVL3 (GOWN DISPOSABLE) ×4 IMPLANT
GOWN STRL REUS W/TWL XL LVL3 (GOWN DISPOSABLE) IMPLANT
KIT TURNOVER KIT A (KITS) ×2 IMPLANT
MANIFOLD NEPTUNE II (INSTRUMENTS) ×2 IMPLANT
MESH VENTRALEX ST 1-7/10 CRC S (Mesh General) IMPLANT
NDL HYPO 18GX1.5 BLUNT FILL (NEEDLE) ×2 IMPLANT
NDL HYPO 21X1.5 SAFETY (NEEDLE) ×2 IMPLANT
NEEDLE HYPO 18GX1.5 BLUNT FILL (NEEDLE) ×1
NEEDLE HYPO 21X1.5 SAFETY (NEEDLE) ×1
NS IRRIG 1000ML POUR BTL (IV SOLUTION) ×2 IMPLANT
PACK MINOR (CUSTOM PROCEDURE TRAY) ×2 IMPLANT
PAD ARMBOARD 7.5X6 YLW CONV (MISCELLANEOUS) ×2 IMPLANT
PENCIL SMOKE EVACUATOR (MISCELLANEOUS) ×2 IMPLANT
POSITIONER HEAD 8X9X4 ADT (SOFTGOODS) ×2 IMPLANT
SET BASIN LINEN APH (SET/KITS/TRAYS/PACK) ×2 IMPLANT
SUT MNCRL AB 4-0 PS2 18 (SUTURE) ×2 IMPLANT
SUT VIC AB 0 CT2 8-18 (SUTURE) IMPLANT
SUT VIC AB 3-0 SH 27 (SUTURE) ×1
SUT VIC AB 3-0 SH 27X BRD (SUTURE) ×2 IMPLANT
SYR 30ML LL (SYRINGE) ×4 IMPLANT

## 2023-10-22 NOTE — Anesthesia Procedure Notes (Signed)
Procedure Name: Intubation Date/Time: 10/22/2023 7:35 AM  Performed by: Cy Blamer, CRNAPre-anesthesia Checklist: Patient identified, Emergency Drugs available, Suction available and Patient being monitored Patient Re-evaluated:Patient Re-evaluated prior to induction Oxygen Delivery Method: Circle system utilized Preoxygenation: Pre-oxygenation with 100% oxygen Induction Type: IV induction Ventilation: Mask ventilation without difficulty and Oral airway inserted - appropriate to patient size Laryngoscope Size: Hyacinth Meeker and 2 Grade View: Grade I Tube type: Oral Tube size: 7.5 mm Number of attempts: 1 Airway Equipment and Method: Stylet and Bite block Placement Confirmation: ETT inserted through vocal cords under direct vision, positive ETCO2 and breath sounds checked- equal and bilateral Secured at: 22 cm Tube secured with: Tape Dental Injury: Teeth and Oropharynx as per pre-operative assessment

## 2023-10-22 NOTE — Transfer of Care (Signed)
Immediate Anesthesia Transfer of Care Note  Patient: Christopher Randolph  Procedure(s) Performed: HERNIA REPAIR UMBILICAL ADULT W/ MESH (Abdomen)  Patient Location: PACU  Anesthesia Type:General  Level of Consciousness: awake, alert , and oriented  Airway & Oxygen Therapy: Patient Spontanous Breathing and Patient connected to nasal cannula oxygen  Post-op Assessment: Report given to RN, Post -op Vital signs reviewed and stable, Patient moving all extremities X 4, and Patient able to stick tongue midline  Post vital signs: Reviewed and stable  Last Vitals:  Vitals Value Taken Time  BP 140/61 10/22/23 0817  Temp 98.3   Pulse 73 10/22/23 0819  Resp 17 10/22/23 0819  SpO2 94 % 10/22/23 0819  Vitals shown include unfiled device data.  Last Pain:  Vitals:   10/22/23 0650  TempSrc: Oral  PainSc: 6          Complications: No notable events documented.

## 2023-10-22 NOTE — Op Note (Signed)
[facility] Operative Note  10/22/23  Preoperative Diagnosis: Umbilical hernia    Postoperative Diagnosis: Same   Procedure(s) Performed: Open hernia repair with Ventralex ST patch 4.3cm, defect size 1cm   Surgeon: Leatrice Jewels. Henreitta Leber, MD   Assistants: No qualified resident was available    Anesthesia: General endotracheal   Anesthesiologist: Dr. Leta Jungling    Specimens: None    Estimated Blood Loss: Minimal   Blood Replacement: None    Complications: None   Wound Class: Clean    Operative Indications: Christopher Randolph is a 77 yo with a small umbilical hernia. He has a history of reaction to an Arthrex Fiberwire polyester suture during a tendon repair and we discussed repair not using any polyester material or related materials. We discussed risk of bleeding, infection, recurrence, issues with reaction to polypropylene material which is what the mesh is made out of but is different that polyester.    Findings: 1cm defect, reducible    Procedure: The patient was taken to the operating room and placed supine. General endotracheal anesthesia was induced. Intravenous antibiotics were administered per protocol.  The abdomen was prepared and draped in the usual sterile fashion.   The umbilical hernia was noted to be reducible and measured about 1cm. An incision was made under the umbilicus, and carried down through the subcutaneous tissue with electrocautery.  Dissection was performed down to the level of the fascia, exposing the hernia sac.  The hernia sac was opened with care, and excess hernia sac was resected with electrocautery.  A finger was ran on the underlying peritoneum and this was clear.  A 4.3 cm Ventralex St Hernia Patch was placed and secured with 0 Vicryl sutures ensuring that it was against the peritoneal cavity.  The hernia defect was then closed with 0 Vicryl suture in an interrupted fashion over the patch.   I opted to use Vicryl as I did not want to use any  material with polyester. The umbilicus was tacked to the fascia with a 3-0 Vicryl suture.   Hemostasis was confirmed. The skin was closed with a running 4-0 Monocryl suture and dermabond.  After the dermabond dried a 2X2 and tegaderm were placed over the umbilicus to act as a pressure dressing.    All counts were correct at the end of the case. The patient was awakened from anesthesia and extubated without complication.  The patient went to the PACU in stable condition.  Christopher Greenhouse, MD [facility] [address] [place], [place] [address] [phone] (office)

## 2023-10-22 NOTE — Discharge Instructions (Addendum)
Discharge Instructions Hernia:  You can restart your farxiga 10/23/2023 and you can restart your plavix 10/24/2023.  Rest and take it easy for the next week. After that you can start back with more activity and drive as long as you feel like you can slam on the breaks, stir and are not on narcotic pain medication.   Common Complaints: Pain at the incision site is common. This will improve with time. Take your pain medications as described below. Some nausea is common and poor appetite. The main goal is to stay hydrated the first few days after surgery.   Diet/ Activity: Diet as tolerated. You may not have an appetite, but it is important to stay hydrated. Drink 64 ounces of water a day. Your appetite will return with time.  Remove the small clear dressing and gauze after two days (48 hours)- 10/24/2023. Trim the gauze off the glue that is underneath if the gauze is stuck to the glue. Shower per your regular routine daily.  Do not take hot showers. Take warm showers that are less than 10 minutes as hot water can make the glue peel. Rest and listen to your body, but do not remain in bed all day.Walk everyday for at least 15-20 minutes.  Deep cough and move around every 1-2 hours in the first few days after surgery. Do not pick at the dermabond glue on your incision sites.  This glue film will remain in place for 1-2 weeks and will start to peel off. Do not place lotions or balms on your incision unless instructed to specifically by Dr. Henreitta Leber. Do not lift > 10 lbs, perform excessive bending, pushing, pulling, squatting for 6-8 weeks after surgery. Where your abdominal binder with activity as much as possible. The activity restrictions and the abdominal binder are to prevent hernia formation at your incision while you are healing.   Pain Expectations and Narcotics: -After surgery you will have pain associated with your incisions and this is normal. The pain is muscular and nerve pain, and will  get better with time. -You are encouraged and expected to take non narcotic medications like tylenol and ibuprofen (when able) to treat pain as multiple modalities can aid with pain treatment. -Narcotics are only used when pain is severe or there is breakthrough pain. -You are not expected to have a pain score of 0 after surgery, as we cannot prevent pain. A pain score of 3-4 that allows you to be functional, move, walk, and tolerate some activity is the goal. The pain will continue to improve over the days after surgery and is dependent on your surgery. -Due to [place] law, we are only able to give a certain amount of pain medication to treat post operative pain, and we only give additional narcotics on a patient by patient basis.  -For most laparoscopic surgery, studies have shown that the majority of patients only need 10-15 narcotic pills, and for open surgeries most patients only need 15-20.   -Having appropriate expectations of pain and knowledge of pain management with non narcotics is important as we do not want anyone to become addicted to narcotic pain medication.  -Using ice packs in the first 48 hours and heating pads after 48 hours, wearing an abdominal binder (when recommended), and using over the counter medications are all ways to help with pain management.   -Simple acts like meditation and mindfulness practices after surgery can also help with pain control and research has proven the benefit of these practices.  Medication: Take tylenol and ibuprofen as needed for pain control, alternating every 4-6 hours.  Example:  Tylenol 1000mg  @ 6am, 12noon, 6pm, (Do not exceed 4000mg  of tylenol a day). Ibuprofen 800mg  @ 9am, 3pm, 9pm, 3am (Do not exceed 3600mg  of ibuprofen a day).  Take Roxicodone for breakthrough pain every 4 hours.  Take Colace for constipation related to narcotic pain medication. If you do not have a bowel movement in 2 days, take Miralax over the counter.  Drink  plenty of water to also prevent constipation.   Contact Information: If you have questions or concerns, please call our office, [phone], Monday- Thursday 8AM-5PM and Friday 8AM-12Noon.  If it is after hours or on the weekend, please call [facility] Main Number, [phone], [phone], and ask to speak to the surgeon on call for Dr. Henreitta Leber at [facility].

## 2023-10-22 NOTE — Progress Notes (Signed)
[facility]  Updated wife. Binder ordered. Rx to pharmacy. Can restart farxiga 10/26 and plavix 10/27.  Will see in office 11/21  No heavy lifting > 10 lbs, excessive bending, pushing, pulling, or squatting for 6-8 weeks after surgery.    [name], MD [facility] [address] [place] [address] [place], [place] [address] [phone] (office)

## 2023-10-22 NOTE — Anesthesia Postprocedure Evaluation (Signed)
Anesthesia Post Note  Patient: Christopher Randolph  Procedure(s) Performed: HERNIA REPAIR UMBILICAL ADULT W/ MESH (Abdomen)  Patient location during evaluation: PACU Anesthesia Type: General Level of consciousness: awake and alert Pain management: pain level controlled Vital Signs Assessment: post-procedure vital signs reviewed and stable Respiratory status: spontaneous breathing, nonlabored ventilation, respiratory function stable and patient connected to nasal cannula oxygen Cardiovascular status: blood pressure returned to baseline and stable Postop Assessment: no apparent nausea or vomiting Anesthetic complications: no   There were no known notable events for this encounter.   Last Vitals:  Vitals:   10/22/23 0836 10/22/23 0845  BP:  (!) 146/61  Pulse: 72 71  Resp: 17 16  Temp:  (!) 36.4 C  SpO2: 94% 95%    Last Pain:  Vitals:   10/22/23 0845  TempSrc: Axillary  PainSc: 0-No pain                 Becky Berberian L Derica Leiber

## 2023-10-22 NOTE — Interval H&P Note (Signed)
History and Physical Interval Note:  10/22/2023 7:23 AM  Christopher Randolph  has presented today for surgery, with the diagnosis of UMBILICAL HERNIA 2 CM.  The various methods of treatment have been discussed with the patient and family. After consideration of risks, benefits and other options for treatment, the patient has consented to  Procedure(s): HERNIA REPAIR UMBILICAL ADULT W/ MESH (N/A) as a surgical intervention.  The patient's history has been reviewed, patient examined, no change in status, stable for surgery.  I have reviewed the patient's chart and labs.  Questions were answered to the patient's satisfaction.    Discussed hernia again with patient. Will not use any polyester due to his reaction.  Lucretia Roers

## 2023-10-27 ENCOUNTER — Encounter (HOSPITAL_COMMUNITY): Payer: Self-pay | Admitting: General Surgery

## 2023-11-01 DIAGNOSIS — M109 Gout, unspecified: Secondary | ICD-10-CM | POA: Diagnosis not present

## 2023-11-04 DIAGNOSIS — M10071 Idiopathic gout, right ankle and foot: Secondary | ICD-10-CM | POA: Diagnosis not present

## 2023-11-15 DIAGNOSIS — E7849 Other hyperlipidemia: Secondary | ICD-10-CM | POA: Diagnosis not present

## 2023-11-15 DIAGNOSIS — E1151 Type 2 diabetes mellitus with diabetic peripheral angiopathy without gangrene: Secondary | ICD-10-CM | POA: Diagnosis not present

## 2023-11-15 DIAGNOSIS — E039 Hypothyroidism, unspecified: Secondary | ICD-10-CM | POA: Diagnosis not present

## 2023-11-15 DIAGNOSIS — E1169 Type 2 diabetes mellitus with other specified complication: Secondary | ICD-10-CM | POA: Diagnosis not present

## 2023-11-17 ENCOUNTER — Ambulatory Visit: Payer: Self-pay | Admitting: *Deleted

## 2023-11-17 ENCOUNTER — Encounter: Payer: Self-pay | Admitting: *Deleted

## 2023-11-18 ENCOUNTER — Encounter: Payer: Self-pay | Admitting: General Surgery

## 2023-11-18 ENCOUNTER — Ambulatory Visit: Payer: Medicare Other | Admitting: General Surgery

## 2023-11-18 VITALS — BP 112/68 | HR 83 | Temp 98.2°F | Resp 14 | Ht 68.0 in | Wt 176.0 lb

## 2023-11-18 DIAGNOSIS — K429 Umbilical hernia without obstruction or gangrene: Secondary | ICD-10-CM | POA: Diagnosis not present

## 2023-11-18 NOTE — Patient Instructions (Signed)
Between 4- 8 weeks after surgery you can slowly start to lift 10-20 lbs. After that you can gradually start to lift more than 20 lbs.

## 2023-11-18 NOTE — Patient Outreach (Signed)
Care Coordination   Follow Up Visit Note   11/18/2023 Name: Christopher Randolph MRN: [id number] DOB: 1946-12-10  Christopher Randolph is a 77 y.o. year old male who sees Burdine, Ananias Pilgrim, MD for primary care. I spoke with  Christopher Randolph by phone today.  What matters to the patients health and wellness today?  Ongoing management of medical conditions. Patient did not endorse any specific concerns today.    Goals Addressed             This Visit's Progress    COMPLETED: Manage Blood Sugar   On track    Care Coordination Goals: Patient will follow-up with PCP as directed Patient will continue to monitor and record blood sugar daily and as needed and will call provider with any readings less than 70 or greater than 200.  Patient will eat 3 meals per day with less than 30 GM of carbohydrates and up to 2 snacks, if needed, with less than 15 GM of carbohydrates  Patient does not have any acute or urgent needs related to this goal and will follow-up with PCP regarding management.      COMPLETED: Manage CHF   On track    Care Coordination Goals: Patient will continue to monitor and record weights each morning after urinating Patient will call cardiologist with any weight gain of >3 lbs overnight or >5lbs in 1 week Patient will continue to follow a low sodium, heart healthy diet with less than 2300 mg of sodium per day Patient will continue to maintain fluid restriction of 64 oz per day Patient will reach out to cardiologist or PCP with any new or worsening symptoms Patient will continue to monitor and record blood pressure daily and will reach out to provider with any readings outside of recommended range Patient will continue to take medications as prescribed Takes furosemide every other day and per cardiologist, can take an additional furosemide if weight is >2 lbs overnight or >5 lbs in one week Patient will follow-up with cardiologist in 11/2023 and sooner if needed  Patient does not  have any acute or urgent needs related to this goal and will follow-up with PCP regarding management.      COMPLETED: Manage COPD   On track    Care Coordination Goals: Patient will follow-up with pulmonologist in 01/2024 and sooner if needed Patient will continue track and manage COPD triggers and reference COPD Action Plan handout that was previously provided as a guide as well as instructions given by pulmonologist  Patient will remain active and engage in light exercise as tolerated at least 3-5 days a week to aid in the the management of COPD Patient will continue monitor and record SPO2 levels daily and as needed  Patient does not have any acute or urgent needs related to this goal and will follow-up with PCP regarding management.          SDOH assessments and interventions completed:  Yes  SDOH Interventions Today    Flowsheet Row Most Recent Value  SDOH Interventions   Housing Interventions Intervention Not Indicated  Financial Strain Interventions Intervention Not Indicated        Care Coordination Interventions:  Yes, provided  Interventions Today    Flowsheet Row Most Recent Value  Chronic Disease   Chronic disease during today's visit Diabetes, Congestive Heart Failure (CHF), Chronic Obstructive Pulmonary Disease (COPD)  General Interventions   General Interventions Discussed/Reviewed General Interventions Reviewed, General Interventions Discussed, Durable Medical Equipment (DME), Doctor  Visits  Doctor Visits Discussed/Reviewed Doctor Visits Discussed, Doctor Visits Reviewed, PCP, Annual Wellness Visits, Specialist  [reviewed and discussed recent hernia surgery. Surgery went well and he hasn't had any discomfort or complaints since.]  Durable Medical Equipment (DME) Glucomoter, BP Cuff, Other  [pulse ox: SPO2 is staying between 94-99%. Scales: weight is stable between 167-170 lbs. Blood presure was 142/80 today. Glucose 111. Highest has been 155. No readings below  70.]  PCP/Specialist Visits Compliance with follow-up visit  [Follow-up with Dr Leandrew Koyanagi on 11/21/23. Follow-up with surgeon for post operative visit on 11/18/23. Patient has been outreached by Care Management team at [facility] as well as his RN through [employer]. He will continue to work with them.]  Exercise Interventions   Exercise Discussed/Reviewed Exercise Discussed, Exercise Reviewed, Physical Activity  Physical Activity Discussed/Reviewed Physical Activity Discussed, Physical Activity Reviewed  [performs ADLs independently. Encouraged to remain active with a goal of 150 minutes per week.]  Education Interventions   Education Provided Provided Education  Provided Verbal Education On Nutrition, Blood Sugar Monitoring, Labs, Eye Care, Foot Care, When to see the doctor, Exercise, Medication  Labs Reviewed Hgb A1c  [03/28/23 A1C 6.6%]  Nutrition Interventions   Nutrition Discussed/Reviewed Nutrition Discussed, Nutrition Reviewed, Adding fruits and vegetables, Fluid intake, Carbohydrate meal planning, Increasing proteins  [Limit fluids to 60 ounces per day. Eat 3 meals per day with less than 30 GM CHO and up to 2 snacks per day, if needed, with less than 15 GM of CHO.]  Pharmacy Interventions   Pharmacy Dicussed/Reviewed Pharmacy Topics Discussed, Pharmacy Topics Reviewed, Medications and their functions  [patient is taking medications as directed. No questions or concerns.]  Safety Interventions   Safety Discussed/Reviewed Safety Discussed, Safety Reviewed       Follow up plan: No further intervention required. Patient's Primary Care office is not partnering with the [facility] for care management and will be providing Care Management Services themselves. This final Care Management note will be securely faxed to the PCP office for handoff. Patient has been encouraged to reach out to their PCP office with any resource or care management needs.   Encounter Outcome:  Patient Visit Completed    Demetrios Loll, RN, BSN Care Management Coordinator [facility]  [facility] Direct Dial: [phone] Main #: [phone]

## 2023-11-19 DIAGNOSIS — I700 Atherosclerosis of aorta: Secondary | ICD-10-CM | POA: Diagnosis not present

## 2023-11-19 DIAGNOSIS — E039 Hypothyroidism, unspecified: Secondary | ICD-10-CM | POA: Diagnosis not present

## 2023-11-19 DIAGNOSIS — I25119 Atherosclerotic heart disease of native coronary artery with unspecified angina pectoris: Secondary | ICD-10-CM | POA: Diagnosis not present

## 2023-11-19 DIAGNOSIS — I10 Essential (primary) hypertension: Secondary | ICD-10-CM | POA: Diagnosis not present

## 2023-11-19 DIAGNOSIS — E1151 Type 2 diabetes mellitus with diabetic peripheral angiopathy without gangrene: Secondary | ICD-10-CM | POA: Diagnosis not present

## 2023-11-19 DIAGNOSIS — D696 Thrombocytopenia, unspecified: Secondary | ICD-10-CM | POA: Diagnosis not present

## 2023-11-19 DIAGNOSIS — E7849 Other hyperlipidemia: Secondary | ICD-10-CM | POA: Diagnosis not present

## 2023-11-19 DIAGNOSIS — K429 Umbilical hernia without obstruction or gangrene: Secondary | ICD-10-CM | POA: Diagnosis not present

## 2023-11-19 NOTE — Progress Notes (Signed)
[facility]  Doing well. No issues with reactions from the suture or anything to date.  Only took a pain medication.   BP 112/68   Pulse 83   Temp 98.2 F (36.8 C) (Oral)   Resp 14   Ht 5\' 8"  (1.727 m)   Wt 176 lb (79.8 kg)   SpO2 90%   BMI 26.76 kg/m  Healing, no signs of recurrence, minor induration umbilical area  Patient s/p umbilical hernia repair with mesh; doing well.  Between 4- 8 weeks after surgery you can slowly start to lift 10-20 lbs. After that you can gradually start to lift more than 20 lbs.   PRN Follow up  [name], MD [facility] [address] [place], [place] [address] [phone] (office)

## 2023-11-26 DIAGNOSIS — E1169 Type 2 diabetes mellitus with other specified complication: Secondary | ICD-10-CM | POA: Diagnosis not present

## 2023-11-26 DIAGNOSIS — M109 Gout, unspecified: Secondary | ICD-10-CM | POA: Diagnosis not present

## 2023-12-01 DIAGNOSIS — H02831 Dermatochalasis of right upper eyelid: Secondary | ICD-10-CM | POA: Diagnosis not present

## 2023-12-01 DIAGNOSIS — E119 Type 2 diabetes mellitus without complications: Secondary | ICD-10-CM | POA: Diagnosis not present

## 2023-12-01 DIAGNOSIS — H16122 Filamentary keratitis, left eye: Secondary | ICD-10-CM | POA: Diagnosis not present

## 2023-12-01 DIAGNOSIS — H35352 Cystoid macular degeneration, left eye: Secondary | ICD-10-CM | POA: Diagnosis not present

## 2023-12-01 DIAGNOSIS — H02834 Dermatochalasis of left upper eyelid: Secondary | ICD-10-CM | POA: Diagnosis not present

## 2023-12-01 DIAGNOSIS — Z961 Presence of intraocular lens: Secondary | ICD-10-CM | POA: Diagnosis not present

## 2023-12-01 DIAGNOSIS — T8579XS Infection and inflammatory reaction due to other internal prosthetic devices, implants and grafts, sequela: Secondary | ICD-10-CM | POA: Diagnosis not present

## 2023-12-01 DIAGNOSIS — H57813 Brow ptosis, bilateral: Secondary | ICD-10-CM | POA: Diagnosis not present

## 2023-12-28 ENCOUNTER — Other Ambulatory Visit: Payer: Self-pay | Admitting: Cardiology

## 2024-01-10 DIAGNOSIS — L400 Psoriasis vulgaris: Secondary | ICD-10-CM | POA: Diagnosis not present

## 2024-01-10 DIAGNOSIS — L570 Actinic keratosis: Secondary | ICD-10-CM | POA: Diagnosis not present

## 2024-01-15 ENCOUNTER — Other Ambulatory Visit (INDEPENDENT_AMBULATORY_CARE_PROVIDER_SITE_OTHER): Payer: Self-pay | Admitting: Ophthalmology

## 2024-01-17 ENCOUNTER — Other Ambulatory Visit (INDEPENDENT_AMBULATORY_CARE_PROVIDER_SITE_OTHER): Payer: Self-pay

## 2024-01-17 MED ORDER — PREDNISOLONE ACETATE 1 % OP SUSP: 1.0000 [drp] | Freq: Every day | OPHTHALMIC | 3 refills | Status: DC

## 2024-01-26 ENCOUNTER — Other Ambulatory Visit: Payer: Self-pay | Admitting: Cardiology

## 2024-01-31 ENCOUNTER — Other Ambulatory Visit: Payer: Self-pay | Admitting: Cardiology

## 2024-02-01 ENCOUNTER — Other Ambulatory Visit: Payer: Self-pay | Admitting: *Deleted

## 2024-02-01 DIAGNOSIS — I6523 Occlusion and stenosis of bilateral carotid arteries: Secondary | ICD-10-CM

## 2024-02-02 NOTE — Progress Notes (Signed)
 Christopher Randolph, male    DOB: [birth year]    MRN: [id number]   Brief patient profile:  47  yowm quit smoking 2004 due heart concerns former Jude pt self-referred back to pulmonary clinic in [place]  02/04/2024  for GOLD 2 copd /osa     History of Present Illness  02/04/2024  Pulmonary/ 1st office eval/ Sharise Lippy / [facility] on ACEi /DPI Chief Complaint  Patient presents with   Follow-up    1 year follow up / former pt of Dr jude   Dyspnea:  not limited by doe  Cough: lots of nasal congestion in am  with sense of excess pnds x couple hours then fine / some better on flonase  x years  Sleep: bed is flat and 2 pillows s resp cc and unable to use CPAP x years s sequelae SABA use: none  02 use: has inogen not using     No obvious day to day or daytime pattern/variability or assoc excess/ purulent sputum or mucus plugs or hemoptysis or cp or chest tightness, subjective wheeze or overt sinus or hb symptoms.    Also denies any obvious fluctuation of symptoms with weather or environmental changes or other aggravating or alleviating factors except as outlined above   No unusual exposure hx or h/o childhood pna/ asthma or knowledge of premature birth.  Current Allergies, Complete Past Medical History, Past Surgical History, Family History, and Social History were reviewed in [employer] record.  ROS  The following are not active complaints unless bolded Hoarseness, sore throat, dysphagia, dental problems, itching, sneezing,  nasal congestion / sense of discharge of excess mucus or purulent secretions, ear ache,   fever, chills, sweats, unintended wt loss or wt gain, classically pleuritic or exertional cp,  orthopnea pnd or arm/hand swelling  or leg swelling, presyncope, palpitations, abdominal pain, anorexia, nausea, vomiting, diarrhea  or change in bowel habits or change in bladder habits, change in stools or change in urine, dysuria, hematuria,  rash, arthralgias,  visual complaints, headache, numbness, weakness or ataxia or problems with walking or coordination,  change in mood or  memory.            Outpatient Medications Prior to Visit  Medication Sig Dispense Refill   acetaminophen  (TYLENOL ) 500 MG tablet Take 1,000 mg by mouth daily as needed for moderate pain, fever or headache.     aspirin  EC 81 MG tablet Take 81 mg by mouth daily.     atorvastatin  (LIPITOR) 20 MG tablet TAKE 1 TABLET BY MOUTH DAILY 90 tablet 3   Bromfenac  Sodium (PROLENSA ) 0.07 % SOLN Place 1 drop into the left eye daily. 6 mL 6   Bromfenac  Sodium 0.07 % SOLN Place 1 drop into the left eye daily. 3 mL 3   clopidogrel  (PLAVIX ) 75 MG tablet TAKE 1 TABLET BY MOUTH DAILY 30 tablet 11   dapagliflozin  propanediol (FARXIGA ) 10 MG TABS tablet TAKE 1 TABLET BY MOUTH DAILY BEFORE breakfast - MUST SCHEDULE APPOINTMENT 30 tablet 3   fluticasone  (FLONASE ) 50 MCG/ACT nasal spray Place 1 spray into both nostrils daily as needed for allergies or rhinitis.     furosemide  (LASIX ) 20 MG tablet Take 1 tablet (20 mg total) by mouth every other day.     ipratropium-albuterol  (DUONEB) 0.5-2.5 (3) MG/3ML SOLN Take 3 mLs by nebulization every 6 (six) hours as needed. 360 mL 1   latanoprost  (XALATAN ) 0.005 % ophthalmic solution Place 1 drop into the left eye at  bedtime.     levothyroxine  (SYNTHROID ) 175 MCG tablet Take 175 mcg by mouth See admin instructions. 175 mcg every 3rd day.     levothyroxine  (SYNTHROID , LEVOTHROID) 150 MCG tablet Take 150 mcg by mouth See admin instructions. 150 mcg once daily for 2 days, 175mcg every 3rd day - repeat.  1   lisinopril  (ZESTRIL ) 40 MG tablet Take 40 mg by mouth daily.     prednisoLONE  acetate (PRED FORTE ) 1 % ophthalmic suspension Place 1 drop into the left eye daily. 5 mL 3   tamsulosin  (FLOMAX ) 0.4 MG CAPS capsule Take 0.8 mg by mouth at bedtime.     albuterol  (VENTOLIN  HFA) 108 (90 Base) MCG/ACT inhaler Inhale 2 puffs into the lungs every 6 (six) hours as needed  for wheezing or shortness of breath. (Patient not taking: Reported on 09/21/2023) 8 g 2   Fluticasone -Umeclidin-Vilant (TRELEGY ELLIPTA ) 100-62.5-25 MCG/ACT AEPB Inhale 1 puff into the lungs daily. (Patient not taking: Reported on 09/21/2023) 60 each 0   No facility-administered medications prior to visit.    Past Medical History:  Diagnosis Date   AAA (abdominal aortic aneurysm) (HCC)    Arthritis    CAD (coronary artery disease)    Branch vessel and moderate RCA disease 2006   Cancer (HCC) 1990   Melanoma Lower right Leg   Carotid artery disease (HCC)    Complication of anesthesia    blood pressure bottomed out during back surgery   COPD (chronic obstructive pulmonary disease) (HCC)    Essential hypertension    Glaucoma    Heart failure (HCC)    diastolic heart failure   Hyperlipidemia    Hypothyroidism    Lyme disease    PAD (peripheral artery disease) (HCC)    Left common iliac stent 2004   Pneumonia    PONV (postoperative nausea and vomiting)    Stroke (HCC)    TIA   TIA (transient ischemic attack) 02/2022   Type 2 diabetes mellitus (HCC)    Wears dentures    Wears glasses       Objective:     BP 126/69   Pulse 80   Ht 5' 8 (1.727 m)   Wt 176 lb 12.8 oz (80.2 kg)   SpO2 91%   BMI 26.88 kg/m   SpO2: 91 % RA    Amb wm nad / raspy voice/ minimal pseudowheeze    HEENT : Oropharynx  clear s cobblestoning/ upper denture/ lower partioal    Nasal turbinates min edema, no excess secretions   NECK :  without  apparent JVD/ palpable Nodes/TM    LUNGS: no acc muscle use,  Min barrel  contour chest wall with bilateral  slightly decreased bs s audible wheeze and  without cough on insp or exp maneuvers and min  Hyperresonant  to  percussion bilaterally    CV:  RRR  no s3 or murmur or increase in P2, and no edema   ABD:  soft and nontender with pos end  insp Hoover's  in the supine position.  No bruits or organomegaly appreciated   MS:  Nl gait/ ext warm  without deformities Or obvious joint restrictions  calf tenderness, cyanosis or clubbing     SKIN: warm and dry without lesions    NEURO:  alert, approp, nl sensorium with  no motor or cerebellar deficits apparent.            Assessment   COPD GOLD 2 Quit smoking 2004 - PFT's  06/01/23  FEV1 1.82 (69 % ) ratio 0.68  p 15 % improvement from saba p 0 prior to study with DLCO  17 (77%)   and FV curve mildly concave    He barely meets criteria for GOLD 2 copd and has evidence of very mild asthma but has been asyptomatic x for problems with rhinitis which may have more to do with ACEi use than airways dz so do not rec regular f/u in this clinic unless more symptomatic and in need of a maint bronchodilator  Note  When respiratory symptoms begin or become refractory well after a patient reports complete smoking cessation,  Especially when this wasn't the case while they were smoking, a red flag is raised based on the work of Dr Genette which states:  if you quit smoking when your best day FEV1 is still well preserved it is highly unlikely you will progress to severe disease.  That is to say, once the smoking stops,  the symptoms should not suddenly erupt or markedly worsen.  If so, the differential diagnosis should include  obesity/deconditioning,  LPR/Reflux/Aspiration syndromes,  occult CHF, or  especially side effect of medications commonly used in this population.SABRA especially ACEi   Essential hypertension Changed to ACEi to ARB 02/04/2024 due to pnds/ raspy voice and mild pseudowheeze  In the best review of chronic cough to date ( NEJM 2016 375 8455-8448) ,  ACEi are now felt to cause cough in up to  20% of pts which is a 4 fold increase from previous reports and does not include the variety of non-specific complaints we see in pulmonary clinic in pts on ACEi but previously attributed to another dx like  Copd/asthma and  include PNDS, throat and chest congestion, bronchitis, unexplained  dyspnea and noct strangling sensations, and hoarseness, but also  atypical /refractory GERD symptoms like dysphagia and bad heartburn   The only way I know  to prove this is not an ACEi Case is a trial off ACEi x a minimum of 6 weeks then regroup.   >>>  try benicar  40 mg daily in place of ACEi and return here if not satisfied with BP control on this ARB or any flares of cough or wheezing that require more than occasional SABA rx           Each maintenance medication was reviewed in detail including emphasizing most importantly the difference between maintenance and prns and under what circumstances the prns are to be triggered using an action plan format where appropriate.  Total time for H and P, chart review, counseling, reviewing hfa device(s) and generating customized AVS unique to this office visit / same day charting = 45 min           Ozell America, MD 02/04/2024

## 2024-02-03 ENCOUNTER — Ambulatory Visit: Payer: Medicare Other | Attending: Cardiology

## 2024-02-03 ENCOUNTER — Other Ambulatory Visit (INDEPENDENT_AMBULATORY_CARE_PROVIDER_SITE_OTHER): Payer: Self-pay

## 2024-02-03 ENCOUNTER — Telehealth: Payer: Self-pay | Admitting: Cardiology

## 2024-02-03 DIAGNOSIS — I6523 Occlusion and stenosis of bilateral carotid arteries: Secondary | ICD-10-CM | POA: Diagnosis not present

## 2024-02-03 MED ORDER — BROMFENAC SODIUM 0.07 % OP SOLN: 1.0000 [drp] | Freq: Every day | OPHTHALMIC | 3 refills | Status: DC

## 2024-02-03 NOTE — Telephone Encounter (Signed)
 Checking percert on the following patient for   CARTOID STUDY -  02/03/2024 in the [facility]

## 2024-02-04 ENCOUNTER — Ambulatory Visit: Payer: Medicare Other | Admitting: Internal Medicine

## 2024-02-04 ENCOUNTER — Encounter: Payer: Self-pay | Admitting: Internal Medicine

## 2024-02-04 VITALS — BP 126/69 | HR 80 | Ht 68.0 in | Wt 176.8 lb

## 2024-02-04 DIAGNOSIS — J449 Chronic obstructive pulmonary disease, unspecified: Secondary | ICD-10-CM | POA: Diagnosis not present

## 2024-02-04 DIAGNOSIS — I10 Essential (primary) hypertension: Secondary | ICD-10-CM | POA: Diagnosis not present

## 2024-02-04 MED ORDER — OLMESARTAN MEDOXOMIL 40 MG PO TABS: 40.0000 mg | ORAL_TABLET | Freq: Every day | ORAL | 11 refills | Status: DC

## 2024-02-04 NOTE — Patient Instructions (Addendum)
 Stop lisinopril   and start Olmesartan  40 mg one daily for at least at month to see  which if any of your symptoms improve.   Call me for any issues regarding your new blood pressure meds but if doing well stay on them and discuss refills when needed with Dr Lari or your heart doctor  Pulmonary follow up is as needed

## 2024-02-05 NOTE — Assessment & Plan Note (Signed)
 Quit smoking 2004 - PFT's  06/01/23  FEV1 1.82 (69 % ) ratio 0.68  p 15 % improvement from saba p 0 prior to study with DLCO  17 (77%)   and FV curve mildly concave    He barely meets criteria for GOLD 2 copd and has evidence of very mild asthma but has been asyptomatic x for problems with rhinitis which may have more to do with ACEi use than airways dz so do not rec regular f/u in this clinic unless more symptomatic and in need of a maint bronchodilator  Note  When respiratory symptoms begin or become refractory well after a patient reports complete smoking cessation,  Especially when this wasn't the case while they were smoking, a red flag is raised based on the work of Dr Genette which states:  if you quit smoking when your best day FEV1 is still well preserved it is highly unlikely you will progress to severe disease.  That is to say, once the smoking stops,  the symptoms should not suddenly erupt or markedly worsen.  If so, the differential diagnosis should include  obesity/deconditioning,  LPR/Reflux/Aspiration syndromes,  occult CHF, or  especially side effect of medications commonly used in this population.SABRA especially ACEi

## 2024-02-05 NOTE — Assessment & Plan Note (Addendum)
 Changed to ACEi to ARB 02/04/2024 due to pnds/ raspy voice and mild pseudowheeze  In the best review of chronic cough to date ( NEJM 2016 375 8455-8448) ,  ACEi are now felt to cause cough in up to  20% of pts which is a 4 fold increase from previous reports and does not include the variety of non-specific complaints we see in pulmonary clinic in pts on ACEi but previously attributed to another dx like  Copd/asthma and  include PNDS, throat and chest congestion, bronchitis, unexplained dyspnea and noct strangling sensations, and hoarseness, but also  atypical /refractory GERD symptoms like dysphagia and bad heartburn   The only way I know  to prove this is not an ACEi Case is a trial off ACEi x a minimum of 6 weeks then regroup.   >>>  try benicar  40 mg daily in place of ACEi and return here if not satisfied with BP control on this ARB or any flares of cough or wheezing that require more than occasional SABA rx           Each maintenance medication was reviewed in detail including emphasizing most importantly the difference between maintenance and prns and under what circumstances the prns are to be triggered using an action plan format where appropriate.  Total time for H and P, chart review, counseling, reviewing hfa device(s) and generating customized AVS unique to this office visit / same day charting = 45 min new pt eval

## 2024-02-09 ENCOUNTER — Encounter: Payer: Self-pay | Admitting: Nurse Practitioner

## 2024-02-09 ENCOUNTER — Ambulatory Visit: Payer: Medicare Other | Attending: Nurse Practitioner | Admitting: Nurse Practitioner

## 2024-02-09 VITALS — BP 110/58 | HR 64 | Ht 68.0 in | Wt 176.6 lb

## 2024-02-09 DIAGNOSIS — E782 Mixed hyperlipidemia: Secondary | ICD-10-CM

## 2024-02-09 DIAGNOSIS — I6523 Occlusion and stenosis of bilateral carotid arteries: Secondary | ICD-10-CM | POA: Diagnosis not present

## 2024-02-09 DIAGNOSIS — I2510 Atherosclerotic heart disease of native coronary artery without angina pectoris: Secondary | ICD-10-CM | POA: Diagnosis not present

## 2024-02-09 DIAGNOSIS — Z8673 Personal history of transient ischemic attack (TIA), and cerebral infarction without residual deficits: Secondary | ICD-10-CM | POA: Diagnosis not present

## 2024-02-09 DIAGNOSIS — I5032 Chronic diastolic (congestive) heart failure: Secondary | ICD-10-CM | POA: Diagnosis not present

## 2024-02-09 DIAGNOSIS — I10 Essential (primary) hypertension: Secondary | ICD-10-CM | POA: Diagnosis not present

## 2024-02-09 NOTE — Patient Instructions (Signed)
Medication Instructions:  Your physician recommends that you continue on your current medications as directed. Please refer to the Current Medication list given to you today.  *If you need a refill on your cardiac medications before your next appointment, please call your pharmacy*   Lab Work: None If you have labs (blood work) drawn today and your tests are completely normal, you will receive your results only by: MyChart Message (if you have MyChart) OR A paper copy in the mail If you have any lab test that is abnormal or we need to change your treatment, we will call you to review the results.   Testing/Procedures: None   Follow-Up: At [facility], you and your health needs are our priority.  As part of our continuing mission to provide you with exceptional heart care, we have created designated Provider Care Teams.  These Care Teams include your primary Cardiologist (physician) and Advanced Practice Providers (APPs -  Physician Assistants and Nurse Practitioners) who all work together to provide you with the care you need, when you need it.  We recommend signing up for the patient portal called "MyChart".  Sign up information is provided on this After Visit Summary.  MyChart is used to connect with patients for Virtual Visits (Telemedicine).  Patients are able to view lab/test results, encounter notes, upcoming appointments, etc.  Non-urgent messages can be sent to your provider as well.   To learn more about what you can do with MyChart, go to [email or web address].    Your next appointment:   6 month(s)  Provider:   Sharlene Dory, NP  Other Instructions

## 2024-02-09 NOTE — Progress Notes (Unsigned)
Cardiology Office Note:  .   Date:  02/09/2024 ID:  RUBLE Randolph, DOB 1946-03-27, MRN [id number] PCP: Juliette Alcide, MD  [facility] Providers Cardiologist:  Nona Dell, MD    History of Present Illness: .   Christopher Randolph is a 78 y.o. male with a PMH of CAD, HFpEF, hypertension, mixed hyperlipidemia, carotid artery disease, s/p right TCAR in March 2023, prior TIA, type 2 diabetes, who presents today for scheduled follow-up.  Previous cardiovascular history of cardiac cath in 2006 that revealed moderate RCA disease.  Underwent nuclear stress test in 2022 in 2023 that were found to be low risk.  Last seen by Dr. Diona Browner on April 07, 2023.  He was on supplemental oxygen after a recent hospitalization due to acute hypoxic respiratory failure in the setting of rhinovirus, CT imaging was negative for PE.  He was scheduled to follow-up with pulmonology later on.  Overall he was doing well from a cardiac perspective.  Today presents for scheduled follow-up.  He states he is doing okay.  Has had some issues recently with low BP readings.  His lisinopril was switched to olmesartan by his pulmonologist recently to help relieve his sinus issues.  Has been adjusting his medication, including tamsulosin as advised by his daughter who is a pharmacist/PA to improve his BP readings and is doing better. Denies any chest pain, shortness of breath, palpitations, syncope, presyncope, dizziness, orthopnea, PND, swelling or significant weight changes, acute bleeding, or claudication.  ROS: Negative. See HPI.  SH: Daughter Christopher Randolph is a Teacher, early years/pre who has become a Magazine features editor professor and has her PhD.   Studies Reviewed: .    Carotid doppler 01/2024:  Summary:  Right Carotid: Velocities in the right ICA are consistent with a 1-39%  stenosis. Non-hemodynamically significant plaque <50% noted in the  CCA. The ECA appears <50% stenosed. PATENT RIGHT ICA STENT.   Left Carotid: Velocities in the  left ICA are consistent with a 40-59%  stenosis. Non-hemodynamically significant plaque <50% noted in the  CCA. The ECA appears <50% stenosed.   Vertebrals:  Bilateral vertebral arteries demonstrate antegrade flow.  Subclavians: Normal flow hemodynamics were seen in bilateral subclavian arteries.   Lexiscan 05/2022:  Perfusion/Defect LV perfusion is abnormal. There is a moderate size moderate intensity fixed inferior defect constient with prior inferior infarct. Small moderate intensity apical defect most intense in the resting images with normal wall motion consistent with apical thinning.  Chamber Size Left ventricular function is normal. Nuclear stress EF: 64 %. The left ventricular ejection fraction is normal (55-65%). End diastolic cavity size is normal. No evidence of transient ischemic dilation (TID) noted.  Stress Combined Conclusion Findings are consistent with prior myocardial infarction. The study is low risk.   Limited Echo 05/2022:  IMPRESSIONS   1. Left ventricular ejection fraction, by estimation, is 60 to 65%. The  left ventricle has no regional wall motion abnormalities. There is mild  left ventricular hypertrophy.   2. Right ventricular systolic function is normal. The right ventricular  size is normal.   3. The aortic valve is tricuspid. There is moderate calcification of the  aortic valve. There is moderate thickening of the aortic valve.   Echo 02/2022:  1. Left ventricular ejection fraction, by estimation, is 60 to 65%. The  left ventricle has normal function. The left ventricle has no regional  wall motion abnormalities. Left ventricular diastolic parameters are  consistent with Grade II diastolic  dysfunction (pseudonormalization). Elevated left atrial pressure.  2. Right ventricular systolic function is normal. The right ventricular  size is normal. There is normal pulmonary artery systolic pressure. The  estimated right ventricular systolic pressure is 25.7 mmHg.    3. Left atrial size was mildly dilated.   4. The mitral valve is normal in structure. No evidence of mitral valve  regurgitation. No evidence of mitral stenosis.   5. The aortic valve is tricuspid. There is mild calcification of the  aortic valve. Aortic valve regurgitation is not visualized. Aortic valve  sclerosis is present, with no evidence of aortic valve stenosis.   6. The inferior vena cava is normal in size with greater than 50%  respiratory variability, suggesting right atrial pressure of 3 mmHg.  Physical Exam:   VS:  BP (!) 110/58   Pulse 64   Ht 5\' 8"  (1.727 m)   Wt 176 lb 9.6 oz (80.1 kg)   SpO2 94%   BMI 26.85 kg/m    Wt Readings from Last 3 Encounters:  02/09/24 176 lb 9.6 oz (80.1 kg)  02/04/24 176 lb 12.8 oz (80.2 kg)  11/18/23 176 lb (79.8 kg)    GEN: Well nourished, well developed in no acute distress NECK: No JVD; No carotid bruits CARDIAC: S1/S2, RRR, no murmurs, rubs, gallops RESPIRATORY:  Clear to auscultation without rales, wheezing or rhonchi  ABDOMEN: Soft, non-tender, non-distended EXTREMITIES:  No edema; No deformity   ASSESSMENT AND PLAN: .    HFpEF Stage C, NYHA class I symptoms. EF 60-65% in 04/2022. Euvolemic and well compensated on exam.  Continue Farxiga, Lasix, olmesartan. Low sodium diet, fluid restriction <2L, and daily weights encouraged. Educated to contact our office for weight gain of 2 lbs overnight or 5 lbs in one week.  CAD Lexiscan in 2023 indicated previous inferior infarct scar, negative for active ischemia. Stable with no anginal symptoms. No indication for ischemic evaluation.  Continue aspirin, atorvastatin, Plavix, and olmesartan. Heart healthy diet and regular cardiovascular exercise encouraged.   HTN Does admit to some low BPs recently that has resolved since medication has been adjusted with the advice of his daughter.  He is planning to follow-up with Dr. Leandrew Koyanagi regarding this. Discussed to monitor BP at home at least 2  hours after medications and sitting for 5-10 minutes.  Denies any concerning signs or symptoms recently.  Instructed to let us know if SBP remains consistently low (SBP < 100) and plan to reduce olmesartan if that occurs.  He verbalized understanding. Care and ED precautions discussed.   Mixed HLD Most recent LDL on file was 45 from 02/2022. Will request most recent labs from Dr. Cato Mulligan office. Continue atorvastatin. Heart healthy diet and regular cardiovascular exercise encouraged.   Carotid artery disease, s/p TCAR in March 2023 Denies any symptoms. Doing well. Recent carotid duplex revealed mild RICA stenosis and moderate LICA stenosis, stable findings from previous study. Continue current medication regimen. Plan to update carotid dopplers in 1 year.   Prior TIA Denies any symptoms.  Continue current medication regimen.  Continue follow-up with PCP.   Dispo: Follow-up with Dr. Diona Browner or APP in 6 months or sooner anything changes.  Signed, Sharlene Dory, NP

## 2024-02-10 ENCOUNTER — Encounter: Payer: Self-pay | Admitting: Nurse Practitioner

## 2024-02-18 ENCOUNTER — Other Ambulatory Visit: Payer: Self-pay | Admitting: Nurse Practitioner

## 2024-02-18 DIAGNOSIS — I2510 Atherosclerotic heart disease of native coronary artery without angina pectoris: Secondary | ICD-10-CM

## 2024-02-18 DIAGNOSIS — I509 Heart failure, unspecified: Secondary | ICD-10-CM

## 2024-02-18 DIAGNOSIS — I779 Disorder of arteries and arterioles, unspecified: Secondary | ICD-10-CM

## 2024-02-18 DIAGNOSIS — E782 Mixed hyperlipidemia: Secondary | ICD-10-CM

## 2024-02-18 MED ORDER — ATORVASTATIN CALCIUM 40 MG PO TABS: 40.0000 mg | ORAL_TABLET | Freq: Every day | ORAL | 1 refills | Status: DC

## 2024-02-25 DIAGNOSIS — I10 Essential (primary) hypertension: Secondary | ICD-10-CM | POA: Diagnosis not present

## 2024-02-25 DIAGNOSIS — E039 Hypothyroidism, unspecified: Secondary | ICD-10-CM | POA: Diagnosis not present

## 2024-02-25 DIAGNOSIS — E782 Mixed hyperlipidemia: Secondary | ICD-10-CM | POA: Diagnosis not present

## 2024-02-25 DIAGNOSIS — E1151 Type 2 diabetes mellitus with diabetic peripheral angiopathy without gangrene: Secondary | ICD-10-CM | POA: Diagnosis not present

## 2024-03-27 DIAGNOSIS — E782 Mixed hyperlipidemia: Secondary | ICD-10-CM | POA: Diagnosis not present

## 2024-03-27 DIAGNOSIS — E1151 Type 2 diabetes mellitus with diabetic peripheral angiopathy without gangrene: Secondary | ICD-10-CM | POA: Diagnosis not present

## 2024-03-27 DIAGNOSIS — I10 Essential (primary) hypertension: Secondary | ICD-10-CM | POA: Diagnosis not present

## 2024-03-27 DIAGNOSIS — E039 Hypothyroidism, unspecified: Secondary | ICD-10-CM | POA: Diagnosis not present

## 2024-03-28 DIAGNOSIS — M25562 Pain in left knee: Secondary | ICD-10-CM | POA: Diagnosis not present

## 2024-04-17 NOTE — Progress Notes (Signed)
 [facility] - Clinic Note  04/24/2024     CHIEF COMPLAINT Patient presents for Retina Follow Up  HISTORY OF PRESENT ILLNESS: Christopher Randolph is a 78 y.o. male who presents to the clinic today for:   HPI     Retina Follow Up   Patient presents with  Other.  In left eye.  This started 6 months ago.  I, the attending physician,  performed the HPI with the patient and updated documentation appropriately.        Comments   Patient feels the vision is about the same. He is using Latanoprost  OS at bedtime, Bromfenac  OS every day, and Pred OS every day. His blood sugar was 141.       Last edited by Christopher Coffee, MD on 04/24/2024 12:50 PM.    Pt states it feels like his left eye is "just along for the ride", his depth perception is off, if he cuts the grass, he can't tell where he's mowed compared to where he hasn't mowed, he is using PF and bromfenac  once a day, he states his left eye stays red  Referring physician: Alston Jerry, MD [address] [place],  [place] [address]  HISTORICAL INFORMATION:   Selected notes from the MEDICAL RECORD NUMBER Referred by Dr. Asberry Randolph for concern of vitreous hemorrhage   CURRENT MEDICATIONS: Current Outpatient Medications (Ophthalmic Drugs)  Medication Sig   Bromfenac  Sodium (PROLENSA ) 0.07 % SOLN Place 1 drop into the left eye daily.   Bromfenac  Sodium 0.07 % SOLN Place 1 drop into the left eye daily.   latanoprost  (XALATAN ) 0.005 % ophthalmic solution Place 1 drop into the left eye at bedtime.   prednisoLONE  acetate (PRED FORTE ) 1 % ophthalmic suspension Place 1 drop into the left eye daily.   No current facility-administered medications for this visit. (Ophthalmic Drugs)   Current Outpatient Medications (Other)  Medication Sig   acetaminophen  (TYLENOL ) 500 MG tablet Take 1,000 mg by mouth daily as needed for moderate pain, fever or headache.   aspirin  EC 81 MG tablet Take 81 mg by mouth daily.   atorvastatin  (LIPITOR)  40 MG tablet Take 1 tablet (40 mg total) by mouth daily.   clopidogrel  (PLAVIX ) 75 MG tablet TAKE 1 TABLET BY MOUTH DAILY   dapagliflozin  propanediol (FARXIGA ) 10 MG TABS tablet TAKE 1 TABLET BY MOUTH DAILY BEFORE breakfast - MUST SCHEDULE APPOINTMENT   fluticasone  (FLONASE ) 50 MCG/ACT nasal spray Place 1 spray into both nostrils daily as needed for allergies or rhinitis.   Fluticasone -Umeclidin-Vilant (TRELEGY ELLIPTA ) 100-62.5-25 MCG/ACT AEPB Inhale 1 puff into the lungs daily.   furosemide  (LASIX ) 20 MG tablet Take 1 tablet (20 mg total) by mouth every other day.   ipratropium-albuterol  (DUONEB) 0.5-2.5 (3) MG/3ML SOLN Take 3 mLs by nebulization every 6 (six) hours as needed.   levothyroxine  (SYNTHROID ) 175 MCG tablet Take 175 mcg by mouth See admin instructions. 175 mcg every 3rd day.   levothyroxine  (SYNTHROID , LEVOTHROID) 150 MCG tablet Take 150 mcg by mouth See admin instructions. 150 mcg once daily for 2 days, 175mcg every 3rd day - repeat.   olmesartan  (BENICAR ) 40 MG tablet Take 1 tablet (40 mg total) by mouth daily.   tamsulosin  (FLOMAX ) 0.4 MG CAPS capsule Take 0.8 mg by mouth at bedtime.   No current facility-administered medications for this visit. (Other)   REVIEW OF SYSTEMS: ROS   Positive for: Musculoskeletal, Cardiovascular, Eyes Negative for: Constitutional, Gastrointestinal, Neurological, Skin, Genitourinary, HENT, Endocrine,  Respiratory, Psychiatric, Allergic/Imm, Heme/Lymph Last edited by Christopher Randolph, COT on 04/24/2024  8:12 AM.      ALLERGIES Allergies  Allergen Reactions   Erythromycin Swelling    Lip swelling   Shellfish Allergy Rash and Other (See Comments)    Gout flares   Nitrofuran Derivatives Swelling   Nitrostat  [Nitroglycerin ] Other (See Comments)    Severe hypotension   Other Other (See Comments)    Polyester Fiberwire Material by [employer], reported reaction when tendon repaired, wound would not heal, broke down   Zithromax [Azithromycin]  Swelling   PAST MEDICAL HISTORY Past Medical History:  Diagnosis Date   AAA (abdominal aortic aneurysm) (HCC)    Arthritis    CAD (coronary artery disease)    Branch vessel and moderate RCA disease 2006   Cancer (HCC) 1990   Melanoma Lower right Leg   Carotid artery disease (HCC)    Complication of anesthesia    blood pressure bottomed out during back surgery   COPD (chronic obstructive pulmonary disease) (HCC)    Essential hypertension    Glaucoma    Heart failure (HCC)    diastolic heart failure   Hyperlipidemia    Hypothyroidism    Lyme disease    PAD (peripheral artery disease) (HCC)    Left common iliac stent 2004   Pneumonia    PONV (postoperative nausea and vomiting)    Stroke (HCC)    TIA   TIA (transient ischemic attack) 02/2022   Type 2 diabetes mellitus (HCC)    Wears dentures    Wears glasses    Past Surgical History:  Procedure Laterality Date   CATARACT EXTRACTION Bilateral    CATARACT EXTRACTION, BILATERAL     CERVICAL DISC SURGERY     x 1   COLONOSCOPY N/A 11/06/2016   Procedure: COLONOSCOPY;  Surgeon: Christopher Espy, MD;  Location: [facility];  Service: Endoscopy;  Laterality: N/A;  7:30 AM   COLONOSCOPY  2012   Dr. Perri Randolph: normal. reviewed reports, which states he has a history of polyps in remote past.    ELBOW SURGERY Left    EYE SURGERY Bilateral    Cat Sx   EYE SURGERY Left 01/14/2021   [facility] - Dr. Corwin Randolph   IRIDOTOMY / IRIDECTOMY Left 01/14/2021   [facility] - Dr. Corwin Randolph   JOINT REPLACEMENT     LUMBAR WOUND DEBRIDEMENT N/A 01/24/2019   Procedure: LUMBAR WOUND Exploration for Evacation of Seroma vs. Hematoma;  Surgeon: Christopher Herder, MD;  Location: [facility] OR;  Service: Neurosurgery;  Laterality: N/A;   MELANOMA EXCISION     right leg, seen at [facility] and underwent immunotherapy   NECK SURGERY      x 1 yrs ago   PARS PLANA VITRECTOMY Left 04/04/2020   Procedure: PARS PLANA VITRECTOMY WITH 25G REMOVAL/SUTURE INTRAOCULAR LENS;  Surgeon:  Christopher Coffee, MD;  Location: [facility] OR;  Service: Ophthalmology;  Laterality: Left;   REPLACEMENT TOTAL KNEE Right    TRANSCAROTID ARTERY REVASCULARIZATION  Right 03/25/2022   Procedure: Right Transcarotid Artery Revascularization;  Surgeon: Margherita Shell, MD;  Location: [facility] OR;  Service: Vascular;  Laterality: Right;   UMBILICAL HERNIA REPAIR N/A 10/22/2023   Procedure: HERNIA REPAIR UMBILICAL ADULT W/ MESH;  Surgeon: Awilda Bogus, MD;  Location: [facility] ORS;  Service: General;  Laterality: N/A;   WISDOM TOOTH EXTRACTION     FAMILY HISTORY Family History  Problem Relation Age of Onset   Heart disease Mother  Heart attack Mother    Colon polyps Mother    Colon polyps Brother    Heart attack Maternal Grandmother    Heart attack Maternal Grandfather    Colon cancer Neg Hx    SOCIAL HISTORY Social History   Tobacco Use   Smoking status: Former    Current packs/day: 0.00    Average packs/day: 1.5 packs/day for 40.0 years (60.0 ttl pk-yrs)    Types: Cigarettes    Start date: 11/04/1963    Quit date: 10/29/2003    Years since quitting: 20.5    Passive exposure: Never   Smokeless tobacco: Never  Vaping Use   Vaping status: Never Used  Substance Use Topics   Alcohol  use: Yes    Alcohol /week: 15.0 standard drinks of alcohol     Types: 14 Cans of beer, 1 Standard drinks or equivalent per week    Comment: 2 beers or a mixed drink   Drug use: No       OPHTHALMIC EXAM:  Base Eye Exam     Visual Acuity (Snellen - Linear)       Right Left   Dist cc 20/20 20/25    Correction: Glasses         Tonometry (Tonopen, 8:15 AM)       Right Left   Pressure 13 20         Pupils       Dark Light Shape React APD   Right 3 2 Round Brisk None   Left 3 2 Round Brisk None         Visual Fields       Left Right    Full Full         Extraocular Movement       Right Left    Full, Ortho Full, Ortho         Neuro/Psych     Oriented x3: Yes   Mood/Affect: Normal          Dilation     Both eyes: 1.0% Mydriacyl , 2.5% Phenylephrine  @ 8:13 AM           Slit Lamp and Fundus Exam     External Exam       Right Left   External brow ptosis brow ptosis; mild periorbital edema and erythema         Slit Lamp Exam       Right Left   Lids/Lashes Dermatochalasis - upper lid, mild Meibomian gland dysfunction Dermatochalasis, Meibomian gland dysfunction, mild Ptosis, mild Telangiectasia   Conjunctiva/Sclera Nasal and temporal Pinguecula temporal Pinguecula, ahmed plate and scleral patch graft ST quad -- nicely healed, bleb ST conj   Cornea well healed cataract wound well healed cataract wound, trace tear film debris   Anterior Chamber deep and clear Deep, 0.5+ pigment, tube at 0100   Iris Round and moderately dilated, No NVI Round and dilated, No NVI, +Transillumination defects ST   Lens Posterior chamber intraocular lens in good position sutured Akreos IOL in good position   Anterior Vitreous mild vitreous syneresis post vitrectomy, clear         Fundus Exam       Right Left   Disc Pink and Sharp, Compact Pink and Sharp, Compact, mild temporal PPA   C/D Ratio 0.3 0.2   Macula Flat, good foveal reflex, mild RPE mottling, No heme or edema Flat. blunted foveal reflex, central cystic changes - stably improved, mild RPE mottling, No heme or edema   Vessels  attenuated, mild tortuosity attenuated, mild tortuosity   Periphery Attached, round focal area of CR atrophy at 0630 midzone, No heme Attached, No heme           Refraction     Wearing Rx       Sphere Cylinder Axis   Right +0.50 +1.00 178   Left -2.75 +2.50 175           IMAGING AND PROCEDURES  Imaging and Procedures for @TODAY @  OCT, Retina - OU - Both Eyes       Right Eye Quality was good. Central Foveal Thickness: 270. Progression has been stable. Findings include normal foveal contour, no IRF, no SRF (partial PVD - stable).   Left Eye Quality was good. Central  Foveal Thickness: 282. Progression has been stable. Findings include normal foveal contour, no IRF, no SRF (stable resolution of central cystic changes / CME).   Notes *Images captured and stored on drive  Diagnosis / Impression:  OD: NFP, no IRF/SRF; partial PVD OS: NFP; no IRF/SRF - stable resolution of central cystic changes / CME  Clinical management:  See below  Abbreviations: NFP - Normal foveal profile. CME - cystoid macular edema. PED - pigment epithelial detachment. IRF - intraretinal fluid. SRF - subretinal fluid. EZ - ellipsoid zone. ERM - epiretinal membrane. ORA - outer retinal atrophy. ORT - outer retinal tubulation. SRHM - subretinal hyper-reflective material            ASSESSMENT/PLAN:    ICD-10-CM   1. Uveitis-hyphema-glaucoma syndrome of left eye  T85.398A    H20.9    H40.42X0     2. 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 Pigmentary glaucoma of left eye, mild stage  H40.1321     4. Dislocated IOL (intraocular lens), anterior, left  H27.122     5. Cystoid macular edema of left eye  H35.352 OCT, Retina - OU - Both Eyes    6. Pseudophakia of both eyes  Z96.1     7. Lyme disease  A69.20     8. Dermatochalasis of both upper eyelids  H02.831    H02.834     9. Brow ptosis  H57.819      1-4. UGH Syndrome OS  - originally: 3 piece PCIOL OS centered, but iris has large TID in sup temp quadrant with IOL haptic within area  - tmax at [facility] 39 OS and was started on max drops (Cosopt , Brim, latan, and po diamox)  - repeat gonio showed open angles OS, mild focal PAS  - s/p PPV w/ IOL exchange / sutured secondary Akreos OS IOL 04.08.21             - today, doing well, BCVA OS 20/25   - OCT shows stable resolution of CME/IRF             - IOP 20 OS  - s/p Ahmed Valve OS on 1.18.22 ([place])  - now released from Dr. Vickey Grammes care  - monitor   5. CME OS -- recurrent, but currently stably resolved  - mild CME that was improved on 7.21.21  - interval re-development  of CME  06.21.23 (prior recurrences 08.18.21 and 12.16.22)  - s/p STK OS (08.18.21)  - OCT today (09.06.23) shows stable resolution of central cystic changes / CME  - BCVA stable at 20/25   - cont PF and Prolensa  QDaily OS  - f/u 9 months DFE, OCT  6. Pseudophakia OU  - s/p CE/IOL  OU -- pt can't remember dates, one eye by [facility], other eye by [facility]  - h/o UGH OS and now sutured IOL as above  - PCIOL OD appears to be in good position  - sutured Akreos IOL OS in good position  - monitor  7. Lyme disease  - history of diagnosis from target lesions on lower extremities  - no blood test confirmation performed   - completed doxycycline per PCP  - no manifestations in the eye, but will continue to monitor  8,9. Brow ptosis, dermatochalasis OU  - referred to Dr. Althea Atkinson (now at [facility]) for eyelid eval  - holding on repair for now  Ophthalmic Meds Ordered this visit:  Meds ordered this encounter  Medications   prednisoLONE  acetate (PRED FORTE ) 1 % ophthalmic suspension    Sig: Place 1 drop into the left eye daily.    Dispense:  15 mL    Refill:  3   Bromfenac  Sodium 0.07 % SOLN    Sig: Place 1 drop into the left eye daily.    Dispense:  6 mL    Refill:  3     Return for f/u 9-12 months, UGH syndrome OS, DFE, OCT.  There are no Patient Instructions on file for this visit.  This document serves as a record of services personally performed by Jeanice Millard, MD, PhD. It was created on their behalf by Morley Arabia. Bevin Bucks, OA an ophthalmic technician. The creation of this record is the provider's dictation and/or activities during the visit.    Electronically signed by: Morley Arabia. Bevin Bucks, OA 04/24/24 12:51 PM  Jeanice Millard, M.D., Ph.D. Diseases & Surgery of the Retina and Vitreous [facility]  I have reviewed the above documentation for accuracy and completeness, and I agree with the above. Jeanice Millard, M.D., Ph.D. 04/24/24 12:51 PM    Abbreviations: M myopia (nearsighted); A astigmatism; H hyperopia (farsighted); P presbyopia; Mrx spectacle prescription;  CTL contact lenses; OD right eye; OS left eye; OU both eyes  XT exotropia; ET esotropia; PEK punctate epithelial keratitis; PEE punctate epithelial erosions; DES dry eye syndrome; MGD meibomian gland dysfunction; ATs artificial tears; PFAT's preservative free artificial tears; NSC nuclear sclerotic cataract; PSC posterior subcapsular cataract; ERM epi-retinal membrane; PVD posterior vitreous detachment; RD retinal detachment; DM diabetes mellitus; DR diabetic retinopathy; NPDR non-proliferative diabetic retinopathy; PDR proliferative diabetic retinopathy; CSME clinically significant macular edema; DME diabetic macular edema; dbh dot blot hemorrhages; CWS cotton wool spot; POAG primary open angle glaucoma; C/D cup-to-disc ratio; HVF humphrey visual field; GVF goldmann visual field; OCT optical coherence tomography; IOP intraocular pressure; BRVO Branch retinal vein occlusion; CRVO central retinal vein occlusion; CRAO central retinal artery occlusion; BRAO branch retinal artery occlusion; RT retinal tear; SB scleral buckle; PPV pars plana vitrectomy; VH Vitreous hemorrhage; PRP panretinal laser photocoagulation; IVK intravitreal kenalog ; VMT vitreomacular traction; MH Macular hole;  NVD neovascularization of the disc; NVE neovascularization elsewhere; AREDS age related eye disease study; ARMD age related macular degeneration; POAG primary open angle glaucoma; EBMD epithelial/anterior basement membrane dystrophy; ACIOL anterior chamber intraocular lens; IOL intraocular lens; PCIOL posterior chamber intraocular lens; Phaco/IOL phacoemulsification with intraocular lens placement; PRK photorefractive keratectomy; LASIK laser assisted in situ keratomileusis; HTN hypertension; DM diabetes mellitus; COPD chronic obstructive pulmonary disease

## 2024-04-24 ENCOUNTER — Ambulatory Visit (INDEPENDENT_AMBULATORY_CARE_PROVIDER_SITE_OTHER): Payer: Medicare Other | Admitting: Ophthalmology

## 2024-04-24 ENCOUNTER — Encounter (INDEPENDENT_AMBULATORY_CARE_PROVIDER_SITE_OTHER): Payer: Self-pay | Admitting: Ophthalmology

## 2024-04-24 DIAGNOSIS — H02831 Dermatochalasis of right upper eyelid: Secondary | ICD-10-CM

## 2024-04-24 DIAGNOSIS — T85398A Other mechanical complication of other ocular prosthetic devices, implants and grafts, initial encounter: Secondary | ICD-10-CM

## 2024-04-24 DIAGNOSIS — H35352 Cystoid macular degeneration, left eye: Secondary | ICD-10-CM | POA: Diagnosis not present

## 2024-04-24 DIAGNOSIS — H27122 Anterior dislocation of lens, left eye: Secondary | ICD-10-CM

## 2024-04-24 DIAGNOSIS — A6920 Lyme disease, unspecified: Secondary | ICD-10-CM

## 2024-04-24 DIAGNOSIS — H401321 Pigmentary glaucoma, left eye, mild stage: Secondary | ICD-10-CM | POA: Diagnosis not present

## 2024-04-24 DIAGNOSIS — H57819 Brow ptosis, unspecified: Secondary | ICD-10-CM

## 2024-04-24 DIAGNOSIS — H209 Unspecified iridocyclitis: Secondary | ICD-10-CM | POA: Diagnosis not present

## 2024-04-24 DIAGNOSIS — Z961 Presence of intraocular lens: Secondary | ICD-10-CM

## 2024-04-24 DIAGNOSIS — H4042X0 Glaucoma secondary to eye inflammation, left eye, stage unspecified: Secondary | ICD-10-CM

## 2024-04-24 MED ORDER — BROMFENAC SODIUM 0.07 % OP SOLN: 1.0000 [drp] | Freq: Every day | OPHTHALMIC | 3 refills | Status: AC

## 2024-04-24 MED ORDER — PREDNISOLONE ACETATE 1 % OP SUSP: 1.0000 [drp] | Freq: Every day | OPHTHALMIC | 3 refills | Status: AC

## 2024-04-26 DIAGNOSIS — E782 Mixed hyperlipidemia: Secondary | ICD-10-CM | POA: Diagnosis not present

## 2024-04-26 DIAGNOSIS — E039 Hypothyroidism, unspecified: Secondary | ICD-10-CM | POA: Diagnosis not present

## 2024-04-26 DIAGNOSIS — E1151 Type 2 diabetes mellitus with diabetic peripheral angiopathy without gangrene: Secondary | ICD-10-CM | POA: Diagnosis not present

## 2024-04-26 DIAGNOSIS — I10 Essential (primary) hypertension: Secondary | ICD-10-CM | POA: Diagnosis not present

## 2024-05-02 ENCOUNTER — Encounter (INDEPENDENT_AMBULATORY_CARE_PROVIDER_SITE_OTHER): Payer: Medicare Other | Admitting: Ophthalmology

## 2024-05-16 DIAGNOSIS — E7849 Other hyperlipidemia: Secondary | ICD-10-CM | POA: Diagnosis not present

## 2024-05-16 DIAGNOSIS — E1151 Type 2 diabetes mellitus with diabetic peripheral angiopathy without gangrene: Secondary | ICD-10-CM | POA: Diagnosis not present

## 2024-05-16 DIAGNOSIS — I10 Essential (primary) hypertension: Secondary | ICD-10-CM | POA: Diagnosis not present

## 2024-05-16 DIAGNOSIS — E039 Hypothyroidism, unspecified: Secondary | ICD-10-CM | POA: Diagnosis not present

## 2024-05-23 DIAGNOSIS — Z0001 Encounter for general adult medical examination with abnormal findings: Secondary | ICD-10-CM | POA: Diagnosis not present

## 2024-05-23 DIAGNOSIS — E782 Mixed hyperlipidemia: Secondary | ICD-10-CM | POA: Diagnosis not present

## 2024-05-23 DIAGNOSIS — E1151 Type 2 diabetes mellitus with diabetic peripheral angiopathy without gangrene: Secondary | ICD-10-CM | POA: Diagnosis not present

## 2024-05-23 DIAGNOSIS — E039 Hypothyroidism, unspecified: Secondary | ICD-10-CM | POA: Diagnosis not present

## 2024-05-23 DIAGNOSIS — I10 Essential (primary) hypertension: Secondary | ICD-10-CM | POA: Diagnosis not present

## 2024-05-25 ENCOUNTER — Other Ambulatory Visit: Payer: Self-pay | Admitting: Cardiology

## 2024-05-26 DIAGNOSIS — E782 Mixed hyperlipidemia: Secondary | ICD-10-CM | POA: Diagnosis not present

## 2024-05-26 DIAGNOSIS — E1151 Type 2 diabetes mellitus with diabetic peripheral angiopathy without gangrene: Secondary | ICD-10-CM | POA: Diagnosis not present

## 2024-05-26 DIAGNOSIS — I10 Essential (primary) hypertension: Secondary | ICD-10-CM | POA: Diagnosis not present

## 2024-05-26 DIAGNOSIS — E039 Hypothyroidism, unspecified: Secondary | ICD-10-CM | POA: Diagnosis not present

## 2024-05-30 DIAGNOSIS — H35352 Cystoid macular degeneration, left eye: Secondary | ICD-10-CM | POA: Diagnosis not present

## 2024-05-30 DIAGNOSIS — E119 Type 2 diabetes mellitus without complications: Secondary | ICD-10-CM | POA: Diagnosis not present

## 2024-05-30 DIAGNOSIS — H57813 Brow ptosis, bilateral: Secondary | ICD-10-CM | POA: Diagnosis not present

## 2024-05-30 DIAGNOSIS — H02834 Dermatochalasis of left upper eyelid: Secondary | ICD-10-CM | POA: Diagnosis not present

## 2024-05-30 DIAGNOSIS — Z961 Presence of intraocular lens: Secondary | ICD-10-CM | POA: Diagnosis not present

## 2024-05-30 DIAGNOSIS — H16122 Filamentary keratitis, left eye: Secondary | ICD-10-CM | POA: Diagnosis not present

## 2024-05-30 DIAGNOSIS — H02831 Dermatochalasis of right upper eyelid: Secondary | ICD-10-CM | POA: Diagnosis not present

## 2024-06-15 DIAGNOSIS — R21 Rash and other nonspecific skin eruption: Secondary | ICD-10-CM | POA: Diagnosis not present

## 2024-06-26 DIAGNOSIS — E039 Hypothyroidism, unspecified: Secondary | ICD-10-CM | POA: Diagnosis not present

## 2024-06-26 DIAGNOSIS — E1151 Type 2 diabetes mellitus with diabetic peripheral angiopathy without gangrene: Secondary | ICD-10-CM | POA: Diagnosis not present

## 2024-06-26 DIAGNOSIS — I10 Essential (primary) hypertension: Secondary | ICD-10-CM | POA: Diagnosis not present

## 2024-06-26 DIAGNOSIS — E782 Mixed hyperlipidemia: Secondary | ICD-10-CM | POA: Diagnosis not present

## 2024-06-28 DIAGNOSIS — R3912 Poor urinary stream: Secondary | ICD-10-CM | POA: Diagnosis not present

## 2024-07-17 ENCOUNTER — Encounter (HOSPITAL_COMMUNITY): Payer: Self-pay | Admitting: Emergency Medicine

## 2024-07-17 ENCOUNTER — Emergency Department (HOSPITAL_COMMUNITY)
Admission: EM | Admit: 2024-07-17 | Discharge: 2024-07-17 | Disposition: A | Discharge status: Home / Self Care | Source: Ambulatory Visit | Attending: Emergency Medicine | Admitting: Emergency Medicine

## 2024-07-17 ENCOUNTER — Other Ambulatory Visit: Payer: Self-pay

## 2024-07-17 ENCOUNTER — Emergency Department (HOSPITAL_COMMUNITY)

## 2024-07-17 DIAGNOSIS — R200 Anesthesia of skin: Secondary | ICD-10-CM | POA: Diagnosis not present

## 2024-07-17 DIAGNOSIS — Z7982 Long term (current) use of aspirin: Secondary | ICD-10-CM | POA: Diagnosis not present

## 2024-07-17 DIAGNOSIS — R531 Weakness: Secondary | ICD-10-CM | POA: Insufficient documentation

## 2024-07-17 DIAGNOSIS — R0789 Other chest pain: Secondary | ICD-10-CM | POA: Diagnosis not present

## 2024-07-17 DIAGNOSIS — J9811 Atelectasis: Secondary | ICD-10-CM | POA: Diagnosis not present

## 2024-07-17 DIAGNOSIS — I6782 Cerebral ischemia: Secondary | ICD-10-CM | POA: Diagnosis not present

## 2024-07-17 DIAGNOSIS — R29898 Other symptoms and signs involving the musculoskeletal system: Secondary | ICD-10-CM | POA: Diagnosis not present

## 2024-07-17 DIAGNOSIS — Z8673 Personal history of transient ischemic attack (TIA), and cerebral infarction without residual deficits: Secondary | ICD-10-CM | POA: Diagnosis not present

## 2024-07-17 DIAGNOSIS — R079 Chest pain, unspecified: Secondary | ICD-10-CM | POA: Diagnosis not present

## 2024-07-17 DIAGNOSIS — R202 Paresthesia of skin: Secondary | ICD-10-CM | POA: Diagnosis not present

## 2024-07-17 LAB — CBC WITH DIFFERENTIAL/PLATELET
Abs Immature Granulocytes: 0.01 K/uL (ref 0.00–0.07)
Basophils Absolute: 0 K/uL (ref 0.0–0.1)
Basophils Relative: 1 %
Eosinophils Absolute: 0.2 K/uL (ref 0.0–0.5)
Eosinophils Relative: 3 %
HCT: 46.6 % (ref 39.0–52.0)
Hemoglobin: 15.8 g/dL (ref 13.0–17.0)
Immature Granulocytes: 0 %
Lymphocytes Relative: 25 %
Lymphs Abs: 1.2 K/uL (ref 0.7–4.0)
MCH: 32 pg (ref 26.0–34.0)
MCHC: 33.9 g/dL (ref 30.0–36.0)
MCV: 94.3 fL (ref 80.0–100.0)
Monocytes Absolute: 0.3 K/uL (ref 0.1–1.0)
Monocytes Relative: 7 %
Neutro Abs: 3 K/uL (ref 1.7–7.7)
Neutrophils Relative %: 64 %
Platelets: 101 K/uL — ABNORMAL LOW (ref 150–400)
RBC: 4.94 MIL/uL (ref 4.22–5.81)
RDW: 13.2 % (ref 11.5–15.5)
WBC: 4.7 K/uL (ref 4.0–10.5)
nRBC: 0 % (ref 0.0–0.2)

## 2024-07-17 LAB — BASIC METABOLIC PANEL WITH GFR
Anion gap: 12 (ref 5–15)
BUN: 20 mg/dL (ref 8–23)
CO2: 23 mmol/L (ref 22–32)
Calcium: 8.8 mg/dL — ABNORMAL LOW (ref 8.9–10.3)
Chloride: 103 mmol/L (ref 98–111)
Creatinine, Ser: 1.05 mg/dL (ref 0.61–1.24)
GFR, Estimated: >60 mL/min (ref >60)
Glucose, Bld: 199 mg/dL — ABNORMAL HIGH (ref 70–99)
Potassium: 4 mmol/L (ref 3.5–5.1)
Sodium: 138 mmol/L (ref 135–145)

## 2024-07-17 LAB — TROPONIN I (HIGH SENSITIVITY)
Troponin I (High Sensitivity): 5 ng/L (ref <18)
Troponin I (High Sensitivity): 5 ng/L (ref <18)

## 2024-07-17 NOTE — Discharge Instructions (Addendum)
 You were seen in the emergency room for chest pain and also left-sided facial numbness.  Your cardiac workup is normal.  It is unclear why you are having chest pain episodes.  We have sent a referral to the cardiologist for close follow-up. Please return to the ER if you have worsening chest pain, shortness of breath, pain radiating to your jaw, shoulder, or back, sweats or fainting.   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  Given that your symptoms have been present for several days, you can get outpatient neurology follow-up.  Make sure you are taking both aspirin  and Plavix .  We have put in a referral to both cardiology and neurology service.  If your symptoms worsen, then return to the ER immediately.

## 2024-07-17 NOTE — ED Provider Notes (Signed)
 [place] EMERGENCY DEPARTMENT AT [facility] Provider Note   CSN: [id number] Arrival date & time: 07/17/24  9088     Patient presents with: Chest Pain   Christopher Randolph is a 78 y.o. male.   HPI     78 year old male comes in with chief complaint of chest pain and numbness and tingling to his face.  According to the patient, has been having some chest discomfort on the left side for the last week or so.  He has also noticed some numbness and tingling sensation to the lower part of his face.  Chest pain is present most of the time, with no specific evoking, aggravating or relieving factors.  Pain is nonradiating.  He denies any associated shortness of breath.  He has had similar pain in the past because of CHF, but typically the symptoms are not as intense and not as constant.    Patient also has noted some left-sided facial numbness in the last few days as well.  With that, he denies any one-sided weakness, numbness, slurring of the speech, vision change.  Balance has been normal.  With symptoms not improving, patient and family decided to come to the ER.  Prior to Admission medications   Medication Sig Start Date End Date Taking? Authorizing Provider  acetaminophen  (TYLENOL ) 500 MG tablet Take 1,000 mg by mouth daily as needed for moderate pain, fever or headache.    [provider]  aspirin  EC 81 MG tablet Take 81 mg by mouth daily.    [provider]  atorvastatin  (LIPITOR) 40 MG tablet Take 1 tablet (40 mg total) by mouth daily. 02/18/24   Miriam Norris, NP  Bromfenac  Sodium (PROLENSA ) 0.07 % SOLN Place 1 drop into the left eye daily. 10/11/23   Valdemar Rogue, MD  Bromfenac  Sodium 0.07 % SOLN Place 1 drop into the left eye daily. 04/24/24   Valdemar Rogue, MD  clopidogrel  (PLAVIX ) 75 MG tablet TAKE 1 TABLET BY MOUTH DAILY 01/26/24   Debera Jayson MATSU, MD  FARXIGA  10 MG TABS tablet TAKE 1 TABLET BY MOUTH DAILY BEFORE breakfast 05/25/24   Debera Jayson MATSU,  MD  fluticasone  (FLONASE ) 50 MCG/ACT nasal spray Place 1 spray into both nostrils daily as needed for allergies or rhinitis.    [provider]  Fluticasone -Umeclidin-Vilant (TRELEGY ELLIPTA ) 100-62.5-25 MCG/ACT AEPB Inhale 1 puff into the lungs daily. 05/31/23   Jude Harden GAILS, MD  furosemide  (LASIX ) 20 MG tablet Take 1 tablet (20 mg total) by mouth every other day. 08/24/22   Debera Jayson MATSU, MD  ipratropium-albuterol  (DUONEB) 0.5-2.5 (3) MG/3ML SOLN Take 3 mLs by nebulization every 6 (six) hours as needed. Patient taking differently: Take 3 mLs by nebulization every 6 (six) hours as needed (wheezing, shortness of breath). 04/01/23   Jillian Buttery, MD  latanoprost  (XALATAN ) 0.005 % ophthalmic solution Place 1 drop into the left eye at bedtime. 02/11/22   [provider]  levothyroxine  (SYNTHROID ) 175 MCG tablet Take 175 mcg by mouth See admin instructions. 175 mcg every 3rd day.    [provider]  levothyroxine  (SYNTHROID , LEVOTHROID) 150 MCG tablet Take 150 mcg by mouth See admin instructions. 150 mcg once daily for 2 days, 175mcg every 3rd day - repeat. 11/01/18   [provider]  olmesartan  (BENICAR ) 40 MG tablet Take 1 tablet (40 mg total) by mouth daily. 02/04/24 02/03/25  Darlean Ozell NOVAK, MD  prednisoLONE  acetate (PRED FORTE ) 1 % ophthalmic suspension Place 1 drop into the  left eye daily. 04/24/24   Valdemar Rogue, MD  pregabalin (LYRICA) 75 MG capsule Take 75 mg by mouth 2 (two) times daily. 06/06/24   [provider]  tamsulosin  (FLOMAX ) 0.4 MG CAPS capsule Take 0.8 mg by mouth at bedtime.    [provider]    Allergies: Erythromycin, Nitrostat  [nitroglycerin ], Shellfish allergy, Other, Nitrofuran derivatives, and Zithromax [azithromycin]    Review of Systems  All other systems reviewed and are negative.   Updated Vital Signs BP 122/73   Pulse (!) 58   Temp 98.1 F (36.7 C)   Resp 20   Ht 5' 8 (1.727 m)   Wt 79.4 kg   SpO2 (!) 88%    BMI 26.61 kg/m   Physical Exam Vitals and nursing note reviewed.  Constitutional:      Appearance: He is well-developed.  HENT:     Head: Atraumatic.  Cardiovascular:     Rate and Rhythm: Normal rate.     Heart sounds: Normal heart sounds.  Pulmonary:     Effort: Pulmonary effort is normal.     Breath sounds: Normal breath sounds.  Musculoskeletal:     Cervical back: Neck supple.  Skin:    General: Skin is warm.  Neurological:     Mental Status: He is alert and oriented to person, place, and time.     Comments: Subjective numbness to the left side of the face noted.  Patient also noted to have slight drift of the left lower extremity, compared to right side     (all labs ordered are listed, but only abnormal results are displayed) Labs Reviewed  BASIC METABOLIC PANEL WITH GFR - Abnormal; Notable for the following components:      Result Value   Glucose, Bld 199 (*)    Calcium  8.8 (*)    All other components within normal limits  CBC WITH DIFFERENTIAL/PLATELET - Abnormal; Notable for the following components:   Platelets 101 (*)    All other components within normal limits  TROPONIN I (HIGH SENSITIVITY)  TROPONIN I (HIGH SENSITIVITY)    EKG: EKG Interpretation Date/Time:  Monday July 17 2024 09:17:11 EDT Ventricular Rate:  80 PR Interval:  191 QRS Duration:  99 QT Interval:  376 QTC Calculation: 434 R Axis:   53  Text Interpretation: Sinus rhythm Borderline T wave abnormalities No acute changes Nonspecific ST and T wave abnormality Confirmed by Charlyn Sora [phone]) on 07/17/2024 10:30:00 AM  Radiology: MR BRAIN WO CONTRAST Result Date: 07/17/2024 CLINICAL DATA:  Neuro deficit, concern for stroke, left-sided facial numbness and left leg weakness. EXAM: MRI HEAD WITHOUT CONTRAST TECHNIQUE: Multiplanar, multiecho pulse sequences of the brain and surrounding structures were obtained without intravenous contrast. COMPARISON:  MRI orbits and CTA head 02/27/2022.  FINDINGS: Brain: No acute infarct. No evidence of intracranial hemorrhage. T2/FLAIR hyperintensity in the periventricular and subcortical white matter. Remote cortical infarct in the right postcentral gyrus. Remote lacunar infarcts in the right basal ganglia and right thalamus. Additional small remote infarct in the posterior left cerebellum. No edema, mass effect, or midline shift. Normal appearance of midline structures. The basilar cisterns are patent. No extra-axial fluid collections. Ventricles: Normal size and configuration of the ventricles. Vascular: Skull base flow voids are visualized. Skull and upper cervical spine: No focal abnormality. Sinuses/Orbits: Bilateral lens replacement. Paranasal sinuses are clear. Other: Mastoid air cells are clear. IMPRESSION: No acute intracranial abnormality. Mild chronic microvascular ischemic changes. 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 Electronically Signed   By: Donnice Mania M.D.   On: 07/17/2024 14:03   DG Chest 2 View Result Date: 07/17/2024 CLINICAL DATA:  Chest pain with numbness and tingling in the face for the past several days. EXAM: CHEST - 2 VIEW COMPARISON:  04/03/2023 FINDINGS: Normal sized heart. Stable chronic elevation of the right hemidiaphragm and adjacent right basilar linear atelectasis. Clear left lung. Mild thoracic spine degenerative changes. IMPRESSION: 1. No acute abnormality. 2. Stable chronic elevation of the right hemidiaphragm and adjacent right basilar linear atelectasis. Electronically Signed   By: Elspeth Bathe M.D.   On: 07/17/2024 10:10     Procedures   Medications Ordered in the ED - No data to display                                  Medical Decision Making Amount and/or Complexity of Data Reviewed Labs: ordered. Radiology: ordered.   This patient presents to the ED with chief complaint(s) of few days of chest discomfort and left-sided facial  numbness.  While in the ER, with neurologic exam left-sided lower extremity drift was also noted. patient with pertinent past medical history of PAD, carotid artery disease, CAD being medically managed, TIA.The complaint involves an extensive differential diagnosis and also carries with it a high risk of complications and morbidity.    The differential diagnosis includes : For the chest pain, differential diagnosis includes ACS, chest discomfort due to cardiac arrhythmia, pneumonia, musculoskeletal pain, GI cause for the pain, pleurisy and PE.  For the complaints of left-sided facial numbness and exam finding of left lower extremity weakness, differential diagnosis includes stroke, recrudescence of previous stroke.  The initial plan is to get basic labs, troponins and MRI of the brain. Patient's symptoms are atypical in nature on the cardiac side.  He denies any exertional chest pain or shortness of breath at this time.  If delta Cam is negative, patient will get outpatient cardiology referral.  If the MRI of the brain is negative, then we will still give patient outpatient neurology referral.  Additional history obtained: Additional history obtained from family Records reviewed previous admission documents and previous cardiology notes, carotid CT scan in 2023 and subsequent ultrasounds which revealed stable, but known carotid artery disease.  Independent labs interpretation:  The following labs were independently interpreted: CBC shows no leukocytosis, electrolytes are overall reassuring.  Initial troponin is normal.  Independent visualization and interpretation of imaging: - I independently visualized the following imaging with scope of interpretation limited to determining acute life threatening conditions related to emergency care: X-ray of the chest, which revealed no evidence of pneumothorax, pneumonia.  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  He also has cerebellar strokes.  I reassessed the patient.  He continues to have left-sided facial numbness.  He states that he walks quite a bit and does not have any limp on the left side.  In the ER, patient has walked without any issues.  I also discussed with him that there is cerebellar stroke.  Patient does state that occasionally he has noted some dizziness that is new to him, but he suspects that that was because of CHF and him being on fluid restriction, through this hot and humid weather.  I had consulted neurology and spoken with Dr. Jerrie -she recommends continued dual antiplatelet therapy and for  patient to follow-up with neurologist.  I have put in a referral for both cardiology and neurology for this patient.  He is aware of the plan and return precautions.   Final diagnoses:  Left facial numbness  Left leg weakness  Chest pain, unspecified type    ED Discharge Orders          Ordered    Ambulatory referral to Neurology       Comments: An appointment is requested in approximately: 2 weeks   07/17/24 1602    Ambulatory referral to Cardiology       Comments: If you have not heard from the Cardiology office within the next 72 hours please call [phone].   07/17/24 [id number]               Charlyn Sora, MD 07/17/24 1607

## 2024-07-17 NOTE — ED Notes (Signed)
 Oxygen  stayed at 93% while ambulating

## 2024-07-17 NOTE — ED Notes (Signed)
 Pt care taken, resting, waiting for a MRI.

## 2024-07-17 NOTE — ED Notes (Signed)
 Patient transported to MRI

## 2024-07-17 NOTE — ED Triage Notes (Signed)
 Pt to the ED from home with complaints of chest pain with numbness and tingling in his face for the past several days.

## 2024-07-20 ENCOUNTER — Ambulatory Visit: Admitting: Diagnostic Neuroimaging

## 2024-07-20 ENCOUNTER — Encounter: Payer: Self-pay | Admitting: Diagnostic Neuroimaging

## 2024-07-20 VITALS — BP 117/69 | HR 62 | Ht 68.0 in | Wt 180.0 lb

## 2024-07-20 DIAGNOSIS — R200 Anesthesia of skin: Secondary | ICD-10-CM

## 2024-07-20 DIAGNOSIS — R29898 Other symptoms and signs involving the musculoskeletal system: Secondary | ICD-10-CM | POA: Diagnosis not present

## 2024-07-20 DIAGNOSIS — R202 Paresthesia of skin: Secondary | ICD-10-CM

## 2024-07-20 NOTE — Progress Notes (Signed)
 [facility]  PATIENT: Christopher Randolph DOB: 12/24/1946  REFERRING CLINICIAN: Charlyn Sora, MD HISTORY FROM: patient and wife REASON FOR VISIT: new consult    HISTORICAL  CHIEF COMPLAINT:  Chief Complaint  Patient presents with   New Patient (Initial Visit)    Room6 NX Dr. Jaysie Benthall-l/s 2023/internal referral for left facial numbness pt stated that this going for a while , having some weakness, in left arm he stated that he has no strength .     HISTORY OF PRESENT ILLNESS:   UPDATE (07/20/24, VRP): Since last visit, had rtight ICA stenosis dx'd and tx'd with stenting. In early July 2025, had new onset of left face numbness, left arm weakness and gait diff, with headaches. He waited 1-2 weeks, then went to ER. MRI was negative for acute stroke. Discharged home. Otherwise stable medically.   PRIOR HPI (03/11/22, VRP): 78 year old male with hypertension, hyperlipidemia, diabetes coronary artery disease, peripheral arterial disease, carotid artery disease, here for evaluation of right eye visual disturbance.  02/26/2022 patient was at home and had some abnormal visual disturbance in his right eye seeing sparkling shooting color sensations.  Eventually he had 5 minutes of right eye vision loss which was complete, but then resolved.  The next day he went to the emergency room for evaluation.  He was admitted for TIA stroke work-up.  Patient has been taking aspirin  81 mg a day and this was changed to Plavix  75 mg daily.  Statin was added.  Blood pressure control was obtained.  CTA of the head and neck showed at least 70% bilateral internal carotid artery stenosis.   REVIEW OF SYSTEMS: Full 14 system review of systems performed and negative with exception of: as per HPI.  ALLERGIES: Allergies  Allergen Reactions   Erythromycin Swelling    Lip swelling   Nitrostat  [Nitroglycerin ] Other (See Comments)    Severe hypotension   Shellfish Allergy Dermatitis, Rash and Other  (See Comments)    Gout flares   Other Other (See Comments)    Polyester Fiberwire Material by [employer], reported reaction when tendon repaired, wound would not heal, broke down   Nitrofuran Derivatives Swelling   Zithromax [Azithromycin] Swelling    HOME MEDICATIONS: Outpatient Medications Prior to Visit  Medication Sig Dispense Refill   acetaminophen  (TYLENOL ) 500 MG tablet Take 1,000 mg by mouth daily as needed for moderate pain, fever or headache.     aspirin  EC 81 MG tablet Take 81 mg by mouth daily.     atorvastatin  (LIPITOR) 40 MG tablet Take 1 tablet (40 mg total) by mouth daily. 90 tablet 1   Bromfenac  Sodium (PROLENSA ) 0.07 % SOLN Place 1 drop into the left eye daily. 6 mL 6   Bromfenac  Sodium 0.07 % SOLN Place 1 drop into the left eye daily. 6 mL 3   clopidogrel  (PLAVIX ) 75 MG tablet TAKE 1 TABLET BY MOUTH DAILY 30 tablet 11   FARXIGA  10 MG TABS tablet TAKE 1 TABLET BY MOUTH DAILY BEFORE breakfast 30 tablet 3   fluticasone  (FLONASE ) 50 MCG/ACT nasal spray Place 1 spray into both nostrils daily as needed for allergies or rhinitis.     Fluticasone -Umeclidin-Vilant (TRELEGY ELLIPTA ) 100-62.5-25 MCG/ACT AEPB Inhale 1 puff into the lungs daily. 60 each 0   furosemide  (LASIX ) 20 MG tablet Take 1 tablet (20 mg total) by mouth every other day.     ipratropium-albuterol  (DUONEB) 0.5-2.5 (3) MG/3ML SOLN Take 3 mLs by nebulization every 6 (six) hours as needed. (Patient  taking differently: Take 3 mLs by nebulization every 6 (six) hours as needed (wheezing, shortness of breath).) 360 mL 1   latanoprost  (XALATAN ) 0.005 % ophthalmic solution Place 1 drop into the left eye at bedtime.     levothyroxine  (SYNTHROID ) 175 MCG tablet Take 175 mcg by mouth See admin instructions. 175 mcg every 3rd day.     levothyroxine  (SYNTHROID , LEVOTHROID) 150 MCG tablet Take 150 mcg by mouth See admin instructions. 150 mcg once daily for 2 days, 175mcg every 3rd day - repeat.  1   olmesartan  (BENICAR ) 40 MG tablet  Take 1 tablet (40 mg total) by mouth daily. 30 tablet 11   prednisoLONE  acetate (PRED FORTE ) 1 % ophthalmic suspension Place 1 drop into the left eye daily. 15 mL 3   pregabalin (LYRICA) 75 MG capsule Take 75 mg by mouth 2 (two) times daily.     tamsulosin  (FLOMAX ) 0.4 MG CAPS capsule Take 0.8 mg by mouth at bedtime.     No facility-administered medications prior to visit.    PAST MEDICAL HISTORY: Past Medical History:  Diagnosis Date   AAA (abdominal aortic aneurysm) (HCC)    Arthritis    CAD (coronary artery disease)    Branch vessel and moderate RCA disease 2006   Cancer (HCC) 1990   Melanoma Lower right Leg   Carotid artery disease (HCC)    Complication of anesthesia    blood pressure bottomed out during back surgery   COPD (chronic obstructive pulmonary disease) (HCC)    Essential hypertension    Glaucoma    Heart failure (HCC)    diastolic heart failure   Hyperlipidemia    Hypothyroidism    Lyme disease    PAD (peripheral artery disease) (HCC)    Left common iliac stent 2004   Pneumonia    PONV (postoperative nausea and vomiting)    Stroke (HCC)    TIA   TIA (transient ischemic attack) 02/2022   Type 2 diabetes mellitus (HCC)    Wears dentures    Wears glasses     PAST SURGICAL HISTORY: Past Surgical History:  Procedure Laterality Date   CATARACT EXTRACTION Bilateral    CATARACT EXTRACTION, BILATERAL     CERVICAL DISC SURGERY     x 1   COLONOSCOPY N/A 11/06/2016   Procedure: COLONOSCOPY;  Surgeon: Lamar CHRISTELLA Hollingshead, MD;  Location: [facility];  Service: Endoscopy;  Laterality: N/A;  7:30 AM   COLONOSCOPY  2012   Dr. Ivery: normal. reviewed reports, which states he has a history of polyps in remote past.    ELBOW SURGERY Left    EYE SURGERY Bilateral    Cat Sx   EYE SURGERY Left 01/14/2021   [facility] - Dr. Dempsey Pardon   IRIDOTOMY / IRIDECTOMY Left 01/14/2021   [facility] - Dr. Dempsey Pardon   JOINT REPLACEMENT     LUMBAR WOUND DEBRIDEMENT N/A 01/24/2019    Procedure: LUMBAR WOUND Exploration for Evacation of Seroma vs. Hematoma;  Surgeon: Alix Lamar, MD;  Location: [facility] OR;  Service: Neurosurgery;  Laterality: N/A;   MELANOMA EXCISION     right leg, seen at [facility] and underwent immunotherapy   NECK SURGERY      x 1 yrs ago   PARS PLANA VITRECTOMY Left 04/04/2020   Procedure: PARS PLANA VITRECTOMY WITH 25G REMOVAL/SUTURE INTRAOCULAR LENS;  Surgeon: Valdemar Rogue, MD;  Location: [facility] OR;  Service: Ophthalmology;  Laterality: Left;   REPLACEMENT TOTAL KNEE Right    TRANSCAROTID ARTERY REVASCULARIZATION  Right 03/25/2022   Procedure: Right Transcarotid Artery Revascularization;  Surgeon: Serene Gaile ORN, MD;  Location: [facility] OR;  Service: Vascular;  Laterality: Right;   UMBILICAL HERNIA REPAIR N/A 10/22/2023   Procedure: HERNIA REPAIR UMBILICAL ADULT W/ MESH;  Surgeon: Kallie Manuelita BROCKS, MD;  Location: [facility];  Service: General;  Laterality: N/A;   WISDOM TOOTH EXTRACTION      FAMILY HISTORY: Family History  Problem Relation Age of Onset   Heart disease Mother    Heart attack Mother    Colon polyps Mother    Colon polyps Brother    Heart attack Maternal Grandmother    Heart attack Maternal Grandfather    Colon cancer Neg Hx     SOCIAL HISTORY: Social History   Socioeconomic History   Marital status: Married    Spouse name: Detta   Number of children: Not on file   Years of education: Not on file   Highest education level: Not on file  Occupational History   Not on file  Tobacco Use   Smoking status: Former    Current packs/day: 0.00    Average packs/day: 1.5 packs/day for 40.0 years (60.0 ttl pk-yrs)    Types: Cigarettes    Start date: 11/04/1963    Quit date: 10/29/2003    Years since quitting: 20.7    Passive exposure: Never   Smokeless tobacco: Never  Vaping Use   Vaping status: Never Used  Substance and Sexual Activity   Alcohol  use: Yes    Alcohol /week: 15.0 standard drinks of alcohol     Types: 14 Cans of beer, 1  Standard drinks or equivalent per week    Comment: 2 beers or a mixed drink   Drug use: No   Sexual activity: Yes  Other Topics Concern   Not on file  Social History Narrative   Lives with wife   Social Drivers of Health   Financial Resource Strain: Low Risk  (11/17/2023)   Overall Financial Resource Strain (CARDIA)    Difficulty of Paying Living Expenses: Not very hard  Food Insecurity: No Food Insecurity (08/12/2023)   Hunger Vital Sign    Worried About Running Out of Food in the Last Year: Never true    Ran Out of Food in the Last Year: Never true  Transportation Needs: No Transportation Needs (10/14/2023)   PRAPARE - Administrator, Civil Service (Medical): No    Lack of Transportation (Non-Medical): No  Physical Activity: Sufficiently Active (09/14/2023)   Exercise Vital Sign    Days of Exercise per Week: 5 days    Minutes of Exercise per Session: 60 min  Stress: Not on file  Social Connections: Unknown (05/12/2022)   [facility] from [facility]   Social Network    Social Network: Not on file  Intimate Partner Violence: Not At Risk (03/28/2023)   Humiliation, Afraid, Rape, and Kick questionnaire    Fear of Current or Ex-Partner: No    Emotionally Abused: No    Physically Abused: No    Sexually Abused: No     PHYSICAL EXAM  GENERAL EXAM/CONSTITUTIONAL: Vitals:  Vitals:   07/20/24 1358  BP: 117/69  Pulse: 62  Weight: 180 lb (81.6 kg)  Height: 5' 8 (1.727 m)   Body mass index is 27.37 kg/m. Wt Readings from Last 3 Encounters:  07/20/24 180 lb (81.6 kg)  07/17/24 175 lb (79.4 kg)  02/09/24 176 lb 9.6 oz (80.1 kg)   Patient is in no distress; well developed,  nourished and groomed; neck is supple  CARDIOVASCULAR: Examination of carotid arteries is normal; RIGHT CAROTID BRUIT Regular rate and rhythm, no murmurs Examination of peripheral vascular system by observation and palpation is normal  EYES: Ophthalmoscopic exam of optic discs and  posterior segments is normal; no papilledema or hemorrhages No results found.  MUSCULOSKELETAL: Gait, strength, tone, movements noted in Neurologic exam below  NEUROLOGIC: MENTAL STATUS:      No data to display         awake, alert, oriented to person, place and time recent and remote memory intact normal attention and concentration language fluent, comprehension intact, naming intact fund of knowledge appropriate  CRANIAL NERVE:  2nd - no papilledema on fundoscopic exam 2nd, 3rd, 4th, 6th - pupils equal and reactive to light, visual fields full to confrontation, extraocular muscles intact, no nystagmus; LEFT PTOSIS 5th - facial sensation --> SLIGHTLY DECR LEFT V2, V3 SENSATION 7th - facial strength symmetric 8th - hearing intact 9th - palate elevates symmetrically, uvula midline 11th - shoulder shrug symmetric 12th - tongue protrusion midline  MOTOR:  normal bulk and tone, full strength in the BUE, BLE SLIGHT RIGHT ARM ORBITING AROUND THE LEFT  SENSORY:  normal and symmetric to light touch, temperature, vibration  COORDINATION:  finger-nose-finger, fine finger movements normal; FINGERTAPPING SYMM  REFLEXES:  deep tendon reflexes TRACE and symmetric  GAIT/STATION:  narrow based gait     DIAGNOSTIC DATA (LABS, IMAGING, TESTING) - I reviewed patient records, labs, notes, testing and imaging myself where available.  Lab Results  Component Value Date   WBC 4.7 07/17/2024   HGB 15.8 07/17/2024   HCT 46.6 07/17/2024   MCV 94.3 07/17/2024   PLT 101 (L) 07/17/2024      Component Value Date/Time   NA 138 07/17/2024 0928   K 4.0 07/17/2024 0928   CL 103 07/17/2024 0928   CO2 23 07/17/2024 0928   GLUCOSE 199 (H) 07/17/2024 0928   BUN 20 07/17/2024 0928   CREATININE 1.05 07/17/2024 0928   CALCIUM  8.8 (L) 07/17/2024 0928   PROT 6.4 (L) 03/29/2023 0305   ALBUMIN  3.0 (L) 03/29/2023 0305   AST 16 03/29/2023 0305   ALT 18 03/29/2023 0305   ALKPHOS 52  03/29/2023 0305   BILITOT 0.6 03/29/2023 0305   GFRNONAA >60 07/17/2024 0928   GFRAA >60 04/04/2020 0957   Lab Results  Component Value Date   CHOL 88 03/26/2022   HDL 31 (L) 03/26/2022   LDLCALC 45 03/26/2022   TRIG 61 03/26/2022   CHOLHDL 2.8 03/26/2022   Lab Results  Component Value Date   HGBA1C 6.6 (H) 10/19/2023   No results found for: VITAMINB12 Lab Results  Component Value Date   TSH 6.760 (H) 03/31/2022   Component Ref Range & Units (hover) 03/11/22  MuSK Antibodies <1.0      Component Ref Range & Units (hover) 03/11/22  AChR Binding Ab, Serum <0.03     02/27/22 MRI brain / orbits - Significantly motion degraded MR imaging of the orbits (with the majority of sequences moderate to severely motion degraded). Within this significant limitation, no definite acute abnormality is identified within the orbits. - Whole brain diffusion-weighted imaging is of good quality and reveals no evidence of acute infarct.  02/27/22 CTA head / neck - No acute intracranial hemorrhage. Age-indeterminate small infarct of the right postcentral gyrus. 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 - No large vessel occlusion. Plaque at the ICA origins causes at least 70% stenosis.  10/30/21  carotid u/s Right Carotid: Velocities in the right ICA are consistent with a 60-79%                 stenosis. Stable.   Left Carotid: Velocities in the left ICA are consistent with a 40-59%  stenosis.               Stable.   Vertebrals:  Bilateral vertebral arteries demonstrate antegrade flow.  Subclavians: Normal flow hemodynamics were seen in bilateral subclavian               arteries.  02/03/24 Carotid u/s  Right Carotid: Velocities in the right ICA are consistent with a 1-39%  stenosis.                Non-hemodynamically significant plaque <50% noted in the  CCA.                The ECA appears <50% stenosed. PATENT RIGHT ICA STENT.   Left Carotid: Velocities in the left ICA are consistent with  a 40-59%  stenosis.                 Non-hemodynamically significant plaque <50% noted in the  CCA.               The ECA appears <50% stenosed.   Vertebrals:  Bilateral vertebral arteries demonstrate antegrade flow.  Subclavians: Normal flow hemodynamics were seen in bilateral subclavian               arteries.    07/17/24 MRI brain -No acute intracranial abnormality.  -Mild chronic microvascular ischemic changes. -Remote infarcts in the right postcentral gyrus, right basal ganglia, right thalamus, and left cerebellum.   ASSESSMENT AND PLAN  78 y.o. year old male here with hypertension, diabetes, hyperlipidemia, coronary disease, peripheral arterial disease, carotid artery disease.   Dx:  1. Numbness and tingling of left side of face   2. Left arm weakness     PLAN:  LEFT FACE NUMBNESS + left arm weakness (July 2025; possible TIA vs MRI negative stroke) - check CTA head / neck - continue aspirin  81, plavix  75, statin, BP control  Right amaurosis fugax (2023; likely from symptomatic right ICA stenosis) - s/p right carotid stenting  LEFT PTOSIS (fluctuating) - could be post-surgical vs cranial neuropathy  Orders Placed This Encounter  Procedures   CT ANGIO HEAD W OR WO CONTRAST   CT ANGIO NECK W OR WO CONTRAST   Return for pending if symptoms worsen or fail to improve, return to PCP.    EDUARD FABIENE HANLON, MD 07/20/2024, 3:15 PM Certified in Neurology, Neurophysiology and Neuroimaging  [facility] [address] [place], [place] [address] [phone]

## 2024-07-22 DIAGNOSIS — M109 Gout, unspecified: Secondary | ICD-10-CM | POA: Diagnosis not present

## 2024-07-25 ENCOUNTER — Telehealth: Payer: Self-pay | Admitting: Diagnostic Neuroimaging

## 2024-07-25 DIAGNOSIS — E782 Mixed hyperlipidemia: Secondary | ICD-10-CM | POA: Diagnosis not present

## 2024-07-25 DIAGNOSIS — I10 Essential (primary) hypertension: Secondary | ICD-10-CM | POA: Diagnosis not present

## 2024-07-25 DIAGNOSIS — E7849 Other hyperlipidemia: Secondary | ICD-10-CM | POA: Diagnosis not present

## 2024-07-25 DIAGNOSIS — E1151 Type 2 diabetes mellitus with diabetic peripheral angiopathy without gangrene: Secondary | ICD-10-CM | POA: Diagnosis not present

## 2024-07-25 DIAGNOSIS — I700 Atherosclerosis of aorta: Secondary | ICD-10-CM | POA: Diagnosis not present

## 2024-07-25 NOTE — Telephone Encounter (Signed)
 no auth required sent to [facility] [phone]

## 2024-07-27 ENCOUNTER — Other Ambulatory Visit: Payer: Self-pay | Admitting: Nurse Practitioner

## 2024-07-27 DIAGNOSIS — E782 Mixed hyperlipidemia: Secondary | ICD-10-CM | POA: Diagnosis not present

## 2024-07-27 DIAGNOSIS — I10 Essential (primary) hypertension: Secondary | ICD-10-CM | POA: Diagnosis not present

## 2024-07-27 DIAGNOSIS — E039 Hypothyroidism, unspecified: Secondary | ICD-10-CM | POA: Diagnosis not present

## 2024-07-27 DIAGNOSIS — E1151 Type 2 diabetes mellitus with diabetic peripheral angiopathy without gangrene: Secondary | ICD-10-CM | POA: Diagnosis not present

## 2024-08-09 ENCOUNTER — Ambulatory Visit (HOSPITAL_COMMUNITY)
Admission: RE | Admit: 2024-08-09 | Discharge: 2024-08-09 | Disposition: A | Discharge status: Home / Self Care | Source: Ambulatory Visit | Attending: Diagnostic Neuroimaging | Admitting: Diagnostic Neuroimaging

## 2024-08-09 DIAGNOSIS — R202 Paresthesia of skin: Secondary | ICD-10-CM | POA: Diagnosis not present

## 2024-08-09 DIAGNOSIS — R200 Anesthesia of skin: Secondary | ICD-10-CM | POA: Insufficient documentation

## 2024-08-09 DIAGNOSIS — R29898 Other symptoms and signs involving the musculoskeletal system: Secondary | ICD-10-CM | POA: Insufficient documentation

## 2024-08-09 DIAGNOSIS — I6523 Occlusion and stenosis of bilateral carotid arteries: Secondary | ICD-10-CM | POA: Diagnosis not present

## 2024-08-09 DIAGNOSIS — I6502 Occlusion and stenosis of left vertebral artery: Secondary | ICD-10-CM | POA: Diagnosis not present

## 2024-08-09 DIAGNOSIS — I672 Cerebral atherosclerosis: Secondary | ICD-10-CM | POA: Diagnosis not present

## 2024-08-09 MED ORDER — IOHEXOL 350 MG/ML SOLN
75.0000 mL | Freq: Once | INTRAVENOUS | Status: AC | PRN
  Administered 2024-08-09 (×2): 75 mL via INTRAVENOUS

## 2024-08-16 NOTE — Progress Notes (Unsigned)
 Cardiology Office Note    Date:  08/18/2024  ID:  Christopher Randolph, DOB 1946-01-25, MRN [id number] PCP:  Lari Elspeth BRAVO, MD  Cardiologist:  Jayson Sierras, MD  Electrophysiologist:  None   Chief Complaint: f/u dizziness, chest discomfort, dyspnea  History of Present Illness: .    Christopher Randolph is a 78 y.o. male with visit-pertinent history of moderate CAD by cath 2006, chronic HFpEF, hypertension, hyperlipidemia, carotid artery disease s/p right TCAR in March 2023 performed due to amaurosis fugax, PAD s/p remote iliac stenting, prior TIA, remote strokes on MRI, type 2 diabetes seen for follow-up.   Remote cath 2006 showed coronary artery disease characterized by stenoses of borderline severity involving a large obtuse marginal branch and a small to normal size diagonal branch, as well as moderate but nonobstructive disease in the right coronary artery. This was managed medically. Last nuclear stress test 05/2022 suggested prior MI but no active ischemia, EF 64%. Last echo 02/2022 EF 60-65%, G1DD, elevated LAP, mild LAE. Last carotid doppler 01/2024 1-39% RICA, 40-59% LICA, patent RICA stent, recommended for 1 year follow-up per MD.  He was recently seen in the ED 07/17/2024 for chest pain and numbness/tingling in his face. Per notes, in early July 2025, had new onset of left face numbness, left arm weakness, gait difficulty, headache. He had not sought care at that time, waited 1-2 weeks then went to ED. On ED 07/17/24, troponins were negative. MRI brain showed mild chronic changes as well as remote infarcts. He saw neurology as outpatient who felt this represented possible TIA vs MRI negative stroke. Dr. Denice ordered CTA head/neck and recommended to continue DAPT. CTA is pending final neurologist review but appeared to show progressive extensive plaque in the proximal LICA resulting in at least 75% stenosis, patent right carotid stent, with unchanged moderate-severe left vertebral origin and  65% stenosis of brachiocephalic origin.  He returns for cardiac follow-up today reporting that he has noticed decreased stamina for at least several months. When he was in [place] on vacation a few weeks ago, he noticed that he would take a few steps before he gave out with fatigue and dyspnea. There was maybe very slight chest pressure but it was not a dominant feature. He has also been dealing with headaches and left face numbness/parasthesias since then as above, prompting ED then neurology eval above. Regarding the chest discomfort/dyspnea, he reports this has spontaneously improved since July, though he still has some residual episodic chest pressure. There is no specific pattern to the chest pressure. It will sometimes increase if he performs extreme exertion but other times just sort of happens briefly then resolves. Other times he is able to exert himself without any difficulty. He reports frequent dizziness, most often exacerbated by transitioning from sitting to standing or abrupt position changes. He can feel this now mildly in the OV with stable VS and rhythm. No syncope or near-syncope. He tried cutting his Lyrica down without significant change.  Orthostatic VS Presenting BP 110/56 Lying BP 125/65 HR 61 Sitting BP 125/57 HR 61 Standing 71m BP 136/68 HR 64 Standing 8m BP 128/69 HR 62  Labwork independently reviewed: 06/2024 trops negx2, Hgb 15.8, plt 101 (intermittently low), K 4.0, Cr 1.05 10/2023 plt 90, LDL 74.6, trig 132 (scan) 89/7975 A1C 6.6 2023 Mg 1.7->2.4, TSH 6.760  ROS: .    Please see the history of present illness.  All other systems are reviewed and otherwise negative.  Studies Reviewed: .  EKG:  EKG personally reviewed today shows:  EKG Interpretation Date/Time:  Friday August 18 2024 13:48:17 EDT Ventricular Rate:  61 PR Interval:  188 QRS Duration:  84 QT Interval:  400 QTC Calculation: 402 R Axis:   38  Text Interpretation: Normal sinus rhythm  Nonspecific T wave abnormality  No acute change from prior Confirmed by Maryfer Tauzin [phone]) on 08/18/2024 2:01:03 PM    CV Studies: Cardiac studies reviewed are outlined and summarized above. Otherwise please see EMR for full report.   Current Reported Medications:.    Current Meds  Medication Sig   acetaminophen  (TYLENOL ) 500 MG tablet Take 1,000 mg by mouth daily as needed for moderate pain, fever or headache.   aspirin  EC 81 MG tablet Take 81 mg by mouth daily.   atorvastatin  (LIPITOR) 40 MG tablet Take 1 tablet (40 mg total) by mouth daily.   Bromfenac  Sodium (PROLENSA ) 0.07 % SOLN Place 1 drop into the left eye daily.   Bromfenac  Sodium 0.07 % SOLN Place 1 drop into the left eye daily.   clopidogrel  (PLAVIX ) 75 MG tablet TAKE 1 TABLET BY MOUTH DAILY   FARXIGA  10 MG TABS tablet TAKE 1 TABLET BY MOUTH DAILY BEFORE breakfast   fluticasone  (FLONASE ) 50 MCG/ACT nasal spray Place 1 spray into both nostrils daily as needed for allergies or rhinitis.   furosemide  (LASIX ) 20 MG tablet Take 1 tablet (20 mg total) by mouth every other day.   ipratropium-albuterol  (DUONEB) 0.5-2.5 (3) MG/3ML SOLN Take 3 mLs by nebulization every 6 (six) hours as needed. (Patient taking differently: Take 3 mLs by nebulization every 6 (six) hours as needed (wheezing, shortness of breath).)   latanoprost  (XALATAN ) 0.005 % ophthalmic solution Place 1 drop into the left eye at bedtime.   levothyroxine  (SYNTHROID ) 175 MCG tablet Take 175 mcg by mouth See admin instructions. 175 mcg every 3rd day.   levothyroxine  (SYNTHROID , LEVOTHROID) 150 MCG tablet Take 150 mcg by mouth See admin instructions. 150 mcg once daily for 2 days, 175mcg every 3rd day - repeat.   olmesartan  (BENICAR ) 40 MG tablet Take 1 tablet (40 mg total) by mouth daily.   prednisoLONE  acetate (PRED FORTE ) 1 % ophthalmic suspension Place 1 drop into the left eye daily.   pregabalin (LYRICA) 75 MG capsule Take 75 mg by mouth 2 (two) times daily. (Patient  taking differently: Take 75 mg by mouth daily.)   tamsulosin  (FLOMAX ) 0.4 MG CAPS capsule Take 0.8 mg by mouth at bedtime.    Physical Exam:    VS:  BP (!) 110/56 (BP Location: Right Arm, Cuff Size: Normal)   Pulse 65   Ht 5' 8 (1.727 m)   Wt 179 lb 9.6 oz (81.5 kg)   SpO2 94%   BMI 27.31 kg/m    Wt Readings from Last 3 Encounters:  08/18/24 179 lb 9.6 oz (81.5 kg)  07/20/24 180 lb (81.6 kg)  07/17/24 175 lb (79.4 kg)    GEN: Well nourished, well developed in no acute distress NECK: No JVD; Soft L>R carotid bruits CARDIAC: RRR, 2/6 SEM RUSB, no rubs, gallops RESPIRATORY:  Clear to auscultation without rales, wheezing or rhonchi  ABDOMEN: Soft, non-tender, non-distended EXTREMITIES:  No edema; No acute deformity   Asessement and Plan:.    1. History of stroke, carotid artery disease - recently seen by neurology with concern for TIA vs MRI negative CVA. He was continued on DAPT. He had CTA head/neck with results as outlined above, pending neurologist review/management. These  results were just finalized today. Await neurology input. Will update echo as well.  2. Dizziness - He is also experiencing orthostatic type-dizziness. Orthostatic are negative today, but resting BP was toward the low end of normal upon initial evaluation today (110/56) therefore we will trial cut down olmesartan  to 20mg  daily. Will also update echocardiogram. Await neurology input regarding CTA results. Note he has previously followed with vascular surgery for his carotid disease. Will update CBC, BMET, TSH today for trending from prior.  3. CAD, episodic chest pressure, heart murmur - troponins negative in ED. EKG showed NSR with nonspecific STTW changes similar to 02/2023. Repeat EKG here without acute change from prior. Discussed updating stress testing with the patient. He would prefer to start with echocardiogram to help guide decision making. If echo shows LV dysfunction or significant valve disease, would  need to consider proceeding with cardiac cath instead. If echo unrevealing, would consider Lexiscan  nuclear stress test (there is a back order on imaging for cardiac PET at [facility] per report). Given dizziness, hold off empiric antianginal therapy at this time and see how he does with reduction of olmesartan . Given his headaches, would not be a good time to consider Imdur. He is already on DAPT from neurologic standpoint. ER precautions reviewed.  4. Hyperlipidemia - last LDL 10/2023 not quite at goal - discussed statin intensification and he wishes to defer. He will plan to discuss with Dr. Lari at follow-up.  5. Chronic HFpEF - appears euvolemic, takes Lasix  every other day. Also on Farxiga . No acute concerns. F/u echo as above.  Disposition: F/u with Dr. Debera or APP after echocardiogram.  Signed, Raphael LOISE Bring, PA-C

## 2024-08-18 ENCOUNTER — Ambulatory Visit: Payer: Self-pay | Admitting: Physician Assistant

## 2024-08-18 ENCOUNTER — Ambulatory Visit: Attending: Physician Assistant | Admitting: Physician Assistant

## 2024-08-18 ENCOUNTER — Other Ambulatory Visit (HOSPITAL_COMMUNITY)
Admission: RE | Admit: 2024-08-18 | Discharge: 2024-08-18 | Disposition: A | Discharge status: Home / Self Care | Source: Ambulatory Visit | Attending: Physician Assistant | Admitting: Physician Assistant

## 2024-08-18 ENCOUNTER — Encounter: Payer: Self-pay | Admitting: Physician Assistant

## 2024-08-18 ENCOUNTER — Telehealth: Payer: Self-pay | Admitting: Diagnostic Neuroimaging

## 2024-08-18 VITALS — BP 110/56 | HR 65 | Ht 68.0 in | Wt 179.6 lb

## 2024-08-18 DIAGNOSIS — E785 Hyperlipidemia, unspecified: Secondary | ICD-10-CM

## 2024-08-18 DIAGNOSIS — R011 Cardiac murmur, unspecified: Secondary | ICD-10-CM | POA: Diagnosis not present

## 2024-08-18 DIAGNOSIS — I779 Disorder of arteries and arterioles, unspecified: Secondary | ICD-10-CM

## 2024-08-18 DIAGNOSIS — R079 Chest pain, unspecified: Secondary | ICD-10-CM | POA: Insufficient documentation

## 2024-08-18 DIAGNOSIS — I2510 Atherosclerotic heart disease of native coronary artery without angina pectoris: Secondary | ICD-10-CM | POA: Diagnosis not present

## 2024-08-18 DIAGNOSIS — R42 Dizziness and giddiness: Secondary | ICD-10-CM | POA: Diagnosis not present

## 2024-08-18 DIAGNOSIS — Z8673 Personal history of transient ischemic attack (TIA), and cerebral infarction without residual deficits: Secondary | ICD-10-CM | POA: Diagnosis not present

## 2024-08-18 DIAGNOSIS — I5032 Chronic diastolic (congestive) heart failure: Secondary | ICD-10-CM | POA: Diagnosis not present

## 2024-08-18 LAB — CBC
HCT: 45.5 % (ref 39.0–52.0)
Hemoglobin: 15.2 g/dL (ref 13.0–17.0)
MCH: 31.7 pg (ref 26.0–34.0)
MCHC: 33.4 g/dL (ref 30.0–36.0)
MCV: 95 fL (ref 80.0–100.0)
Platelets: 106 K/uL — ABNORMAL LOW (ref 150–400)
RBC: 4.79 MIL/uL (ref 4.22–5.81)
RDW: 13 % (ref 11.5–15.5)
WBC: 4.8 K/uL (ref 4.0–10.5)
nRBC: 0 % (ref 0.0–0.2)

## 2024-08-18 LAB — BASIC METABOLIC PANEL WITH GFR
Anion gap: 8 (ref 5–15)
BUN: 22 mg/dL (ref 8–23)
CO2: 26 mmol/L (ref 22–32)
Calcium: 9.1 mg/dL (ref 8.9–10.3)
Chloride: 106 mmol/L (ref 98–111)
Creatinine, Ser: 1.05 mg/dL (ref 0.61–1.24)
GFR, Estimated: >60 mL/min (ref >60)
Glucose, Bld: 148 mg/dL — ABNORMAL HIGH (ref 70–99)
Potassium: 3.9 mmol/L (ref 3.5–5.1)
Sodium: 140 mmol/L (ref 135–145)

## 2024-08-18 LAB — TSH: TSH: 1.601 u[IU]/mL (ref 0.350–4.500)

## 2024-08-18 MED ORDER — OLMESARTAN MEDOXOMIL 20 MG PO TABS: 20.0000 mg | ORAL_TABLET | Freq: Every day | ORAL | 3 refills | Status: DC

## 2024-08-18 NOTE — Telephone Encounter (Signed)
 Pt called wanting to know an update on his MRI results. Please advise.

## 2024-08-18 NOTE — Patient Instructions (Signed)
 Medication Instructions:  Your physician has recommended you make the following change in your medication:   Decrease Olmesartan  to 20 mg Daily ( If Blood Pressure starts to be elevated please call the office)    *If you need a refill on your cardiac medications before your next appointment, please call your pharmacy*  Lab Work: Your physician recommends that you return for lab work in: Today   If you have labs (blood work) drawn today and your tests are completely normal, you will receive your results only by: MyChart Message (if you have MyChart) OR A paper copy in the mail If you have any lab test that is abnormal or we need to change your treatment, we will call you to review the results.  Testing/Procedures: Your physician has requested that you have an echocardiogram. Echocardiography is a painless test that uses sound waves to create images of your heart. It provides your doctor with information about the size and shape of your heart and how well your heart's chambers and valves are working. This procedure takes approximately one hour. There are no restrictions for this procedure. Please do NOT wear cologne, perfume, aftershave, or lotions (deodorant is allowed). Please arrive 15 minutes prior to your appointment time.  Please note: We ask at that you not bring children with you during ultrasound (echo/ vascular) testing. Due to room size and safety concerns, children are not allowed in the ultrasound rooms during exams. Our front office staff cannot provide observation of children in our lobby area while testing is being conducted. An adult accompanying a patient to their appointment will only be allowed in the ultrasound room at the discretion of the ultrasound technician under special circumstances. We apologize for any inconvenience.   Follow-Up: At [facility], you and your health needs are our priority.  As part of our continuing mission to provide you with exceptional  heart care, our providers are all part of one team.  This team includes your primary Cardiologist (physician) and Advanced Practice Providers or APPs (Physician Assistants and Nurse Practitioners) who all work together to provide you with the care you need, when you need it.  Your next appointment:    After Echo   Provider:   You may see Jayson Sierras, MD or one of the following Advanced Practice Providers on your designated Care Team:   Laymon Qua, PA-C  [place], [place] Olivia Pavy, [place]     We recommend signing up for the patient portal called MyChart.  Sign up information is provided on this After Visit Summary.  MyChart is used to connect with patients for Virtual Visits (Telemedicine).  Patients are able to view lab/test results, encounter notes, upcoming appointments, etc.  Non-urgent messages can be sent to your provider as well.   To learn more about what you can do with MyChart, go to [email or web address].   Other Instructions Thank you for choosing [facility]!

## 2024-08-18 NOTE — Telephone Encounter (Signed)
 Spoke w/Pt who is asking for the CT results for the scan he had on 8/13. Informed Pt will send message to provider asking for results but that there was another CT view ordered which has not been completed. Pt stated he thought the one he had done was the only one needed. Explained two different CT scans had been ordered but only one has been completed. Pt voiced understanding and thanks for the call.

## 2024-08-22 ENCOUNTER — Telehealth: Payer: Self-pay

## 2024-08-22 NOTE — Telephone Encounter (Signed)
-----   Message from Nurse Tillman SQUIBB sent at 08/22/2024 10:42 AM EDT ----- Hello,  Please schedule this patient for the earliest next carotid duplex, AAA and MD visit with MD that took over [facility] patients. No need to double book but sooner than later as he missed a follow up in Dec.  Thank you.

## 2024-08-22 NOTE — Telephone Encounter (Signed)
 Appt has been scheduled for 10/03 - LT

## 2024-08-22 NOTE — Telephone Encounter (Signed)
 Pt called back to ask for results in formed pt of conversation with nurse  and Once Results are ready Nurse or MD will call to explain results . PT understood  and will wait on call

## 2024-08-22 NOTE — Telephone Encounter (Signed)
 Patient called to report finding of a recent CT of his neck showed LICA with 75% stenosis. PA Corrina Baglia contacted and she reviewed the patient's results and H&P, orders for carotid duplex, AAA and MD visit to be scheduled.

## 2024-08-23 ENCOUNTER — Ambulatory Visit: Payer: Self-pay | Admitting: Diagnostic Neuroimaging

## 2024-08-24 NOTE — Telephone Encounter (Signed)
 Called and reviewed the CTA head and neck results as described by Dr Margaret. Pt has an apt for further testing requested by vascular surgery scheduled 10/3. Pt verbalized understanding of results and had no further questions.

## 2024-08-24 NOTE — Progress Notes (Signed)
 TSH ordered due to orthostasis and chest pain of uncertain etiology.

## 2024-08-25 DIAGNOSIS — E039 Hypothyroidism, unspecified: Secondary | ICD-10-CM | POA: Diagnosis not present

## 2024-08-25 DIAGNOSIS — E782 Mixed hyperlipidemia: Secondary | ICD-10-CM | POA: Diagnosis not present

## 2024-08-25 DIAGNOSIS — I10 Essential (primary) hypertension: Secondary | ICD-10-CM | POA: Diagnosis not present

## 2024-08-25 DIAGNOSIS — E1151 Type 2 diabetes mellitus with diabetic peripheral angiopathy without gangrene: Secondary | ICD-10-CM | POA: Diagnosis not present

## 2024-08-31 ENCOUNTER — Other Ambulatory Visit: Payer: Self-pay | Admitting: Vascular Surgery

## 2024-08-31 DIAGNOSIS — I7143 Infrarenal abdominal aortic aneurysm, without rupture: Secondary | ICD-10-CM

## 2024-08-31 DIAGNOSIS — I6521 Occlusion and stenosis of right carotid artery: Secondary | ICD-10-CM

## 2024-09-02 NOTE — Telephone Encounter (Signed)
 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  This left ICA stenosis does need closer follow-up, and I will request vascular surgery Dr. Army to review.    Right sided carotid stent with mild narrowing but less than 50% stenosis.  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  Recommend to continue medical management.   -VRP

## 2024-09-05 ENCOUNTER — Ambulatory Visit (HOSPITAL_COMMUNITY)
Admission: RE | Admit: 2024-09-05 | Discharge: 2024-09-05 | Disposition: A | Discharge status: Home / Self Care | Source: Ambulatory Visit | Attending: Physician Assistant | Admitting: Physician Assistant

## 2024-09-05 DIAGNOSIS — E119 Type 2 diabetes mellitus without complications: Secondary | ICD-10-CM | POA: Insufficient documentation

## 2024-09-05 DIAGNOSIS — E785 Hyperlipidemia, unspecified: Secondary | ICD-10-CM | POA: Diagnosis not present

## 2024-09-05 DIAGNOSIS — I350 Nonrheumatic aortic (valve) stenosis: Secondary | ICD-10-CM | POA: Diagnosis not present

## 2024-09-05 DIAGNOSIS — Z8673 Personal history of transient ischemic attack (TIA), and cerebral infarction without residual deficits: Secondary | ICD-10-CM | POA: Insufficient documentation

## 2024-09-05 DIAGNOSIS — R0609 Other forms of dyspnea: Secondary | ICD-10-CM

## 2024-09-05 DIAGNOSIS — I110 Hypertensive heart disease with heart failure: Secondary | ICD-10-CM | POA: Diagnosis not present

## 2024-09-05 DIAGNOSIS — I509 Heart failure, unspecified: Secondary | ICD-10-CM | POA: Insufficient documentation

## 2024-09-05 DIAGNOSIS — R42 Dizziness and giddiness: Secondary | ICD-10-CM | POA: Diagnosis not present

## 2024-09-05 DIAGNOSIS — I2510 Atherosclerotic heart disease of native coronary artery without angina pectoris: Secondary | ICD-10-CM | POA: Diagnosis not present

## 2024-09-05 DIAGNOSIS — Z87891 Personal history of nicotine dependence: Secondary | ICD-10-CM | POA: Diagnosis not present

## 2024-09-05 LAB — ECHOCARDIOGRAM COMPLETE
AR max vel: 1.45 cm2
AV Area VTI: 1.46 cm2
AV Area mean vel: 1.44 cm2
AV Mean grad: 12.3 mmHg
AV Peak grad: 24.1 mmHg
Ao pk vel: 2.45 m/s
Area-P 1/2: 2.29 cm2
S' Lateral: 2.6 cm

## 2024-09-05 NOTE — Progress Notes (Signed)
*  PRELIMINARY RESULTS* Echocardiogram 2D Echocardiogram has been performed.  Christopher Randolph 09/05/2024, 11:39 AM

## 2024-09-06 ENCOUNTER — Other Ambulatory Visit: Payer: Self-pay | Admitting: Nurse Practitioner

## 2024-09-06 MED ORDER — OLMESARTAN MEDOXOMIL 40 MG PO TABS: 40.0000 mg | ORAL_TABLET | Freq: Every day | ORAL | Status: DC

## 2024-09-06 NOTE — Telephone Encounter (Signed)
 I called patient to discuss echo results and status update. He reports to me he did not reduce Olmesartan  to 20 mg at 08/18/24 because the BP log he provided to us  was that of his bp recordings 1 hour after he had taken his medication and that his true am readings before medication were higher (systolic readings 137/140/146).   He decided to stay on 40 mg Olmesartan . He says he is doing pretty good and he says he has taken the 40 mg dose for a long time and would like to continue taking said dose. Of note, he talks about a few episodes of feeling swimmy headed  and when I inquired about his bp he says it was  like 90/60, says this has occurred a few times. He was pretty adamant about staying on 40 mg dose.

## 2024-09-07 ENCOUNTER — Telehealth: Payer: Self-pay | Admitting: Nurse Practitioner

## 2024-09-07 MED ORDER — ATORVASTATIN CALCIUM 40 MG PO TABS: 40.0000 mg | ORAL_TABLET | Freq: Every day | ORAL | 1 refills | Status: DC

## 2024-09-07 NOTE — Telephone Encounter (Signed)
*  STAT* If patient is at the pharmacy, call can be transferred to refill team.   1. Which medications need to be refilled? (please list name of each medication and dose if known) atorvastatin  (LIPITOR) 40 MG tablet    2. Would you like to learn more about the convenience, safety, & potential cost savings by using the [facility]? No   3. Are you open to using the [facility] (Type [facility].) No   4. Which pharmacy/location (including street and city if local pharmacy) is medication to be sent to?  [facility] - [place], [place] - [address]     5. Do they need a 30 day or 90 day supply? 90 day  Pt is out of medication

## 2024-09-07 NOTE — Telephone Encounter (Signed)
 RX sent in

## 2024-09-25 DIAGNOSIS — R051 Acute cough: Secondary | ICD-10-CM | POA: Diagnosis not present

## 2024-09-25 DIAGNOSIS — R0981 Nasal congestion: Secondary | ICD-10-CM | POA: Diagnosis not present

## 2024-09-26 DIAGNOSIS — E1151 Type 2 diabetes mellitus with diabetic peripheral angiopathy without gangrene: Secondary | ICD-10-CM | POA: Diagnosis not present

## 2024-09-26 DIAGNOSIS — E039 Hypothyroidism, unspecified: Secondary | ICD-10-CM | POA: Diagnosis not present

## 2024-09-26 DIAGNOSIS — I10 Essential (primary) hypertension: Secondary | ICD-10-CM | POA: Diagnosis not present

## 2024-09-26 DIAGNOSIS — E782 Mixed hyperlipidemia: Secondary | ICD-10-CM | POA: Diagnosis not present

## 2024-09-27 ENCOUNTER — Encounter: Payer: Self-pay | Admitting: Cardiology

## 2024-09-27 ENCOUNTER — Ambulatory Visit: Attending: Cardiology | Admitting: Cardiology

## 2024-09-27 VITALS — BP 124/62 | HR 100 | Ht 68.0 in | Wt 180.6 lb

## 2024-09-27 DIAGNOSIS — I350 Nonrheumatic aortic (valve) stenosis: Secondary | ICD-10-CM

## 2024-09-27 DIAGNOSIS — E782 Mixed hyperlipidemia: Secondary | ICD-10-CM | POA: Diagnosis not present

## 2024-09-27 DIAGNOSIS — I5032 Chronic diastolic (congestive) heart failure: Secondary | ICD-10-CM

## 2024-09-27 DIAGNOSIS — I6523 Occlusion and stenosis of bilateral carotid arteries: Secondary | ICD-10-CM

## 2024-09-27 DIAGNOSIS — M109 Gout, unspecified: Secondary | ICD-10-CM | POA: Diagnosis not present

## 2024-09-27 DIAGNOSIS — I25119 Atherosclerotic heart disease of native coronary artery with unspecified angina pectoris: Secondary | ICD-10-CM

## 2024-09-27 MED ORDER — ATORVASTATIN CALCIUM 80 MG PO TABS: 80.0000 mg | ORAL_TABLET | Freq: Every day | ORAL | 3 refills | Status: AC

## 2024-09-27 NOTE — Patient Instructions (Signed)
 Medication Instructions:   INCREASE Atorvastatin  80 mg daily  Labwork: None today  Testing/Procedures: None today  Follow-Up: 3-4 months  Any Other Special Instructions Will Be Listed Below (If Applicable).  If you need a refill on your cardiac medications before your next appointment, please call your pharmacy.

## 2024-09-27 NOTE — Progress Notes (Signed)
 Cardiology Office Note  Date: 09/27/2024   ID: LYRICK LAGRAND, DOB 02-Jun-1946, MRN [id number]  History of Present Illness: Christopher Randolph is a 78 y.o. male who last seen in August by Ms. Dunn PA-C, I reviewed her note.  He is here for a routine visit.  Reports improvement in dyspnea on exertion since the high heat and humidity of the summer have improved.  No definite angina at this time and no fluid retention on current regimen.  His weight is stable.  He is having a gout flare in his right foot with plan to see PCP this afternoon.  He has had evidence of progressive carotid artery disease based on interval workup.  CTA showed patent R ICA stent with no hemodynamically significant stenosis, however progression to at least 75% LICA stenosis.  He has a pending visit to see Dr. Pearline later this week for further discussion.  I reviewed his lipid panel from November 2024, LDL was up to 75 at that time.  He reports compliance with Lipitor 40 mg daily.  Otherwise on aspirin  and Plavix  as before.  I did review his follow-up echocardiogram from September.  Physical Exam: VS:  BP 124/62 (BP Location: Right Arm, Cuff Size: Normal)   Pulse 100   Ht 5' 8 (1.727 m)   Wt 180 lb 9.6 oz (81.9 kg)   SpO2 92%   BMI 27.46 kg/m , BMI Body mass index is 27.46 kg/m.  Wt Readings from Last 3 Encounters:  09/27/24 180 lb 9.6 oz (81.9 kg)  08/18/24 179 lb 9.6 oz (81.5 kg)  07/20/24 180 lb (81.6 kg)    General: Patient appears comfortable at rest. HEENT: Conjunctiva and lids normal. Neck: Supple, no elevated JVP, left carotid bruit. Lungs: Clear to auscultation, nonlabored breathing at rest. Cardiac: Regular rate and rhythm, no S3, 2/6 systolic murmur, no pericardial rub. Extremities: No pitting edema.  ECG:  An ECG dated 08/18/2024 was personally reviewed today and demonstrated:  Sinus rhythm with nonspecific T wave changes.  Labwork: November 2024: Cholesterol 142, triglycerides 132, LDL  75 08/18/2024: BUN 22; Creatinine, Ser 1.05; Hemoglobin 15.2; Platelets 106; Potassium 3.9; Sodium 140; TSH 1.601     Component Value Date/Time   CHOL 88 03/26/2022 0127   TRIG 61 03/26/2022 0127   HDL 31 (L) 03/26/2022 0127   CHOLHDL 2.8 03/26/2022 0127   VLDL 12 03/26/2022 0127   LDLCALC 45 03/26/2022 0127   Other Studies Reviewed Today:  IMPRESSION: 1. No acute intracranial abnormality. Unchanged small chronic infarcts as above. 2. Patent right-sided cervical carotid stent without 50% or greater stenosis. 3. Progressed, extensive plaque in the proximal left ICA resulting in at least 75% stenosis, with precise quantification limited by streak artifact. 4. Unchanged moderate-to-severe stenosis of the left vertebral artery origin and 65% stenosis of the brachiocephalic artery origin .  Echocardiogram 09/05/2024:  1. Left ventricular ejection fraction, by estimation, is 60 to 65%. The  left ventricle has normal function. The left ventricle has no regional  wall motion abnormalities. There is mild left ventricular hypertrophy.  Left ventricular diastolic parameters  are consistent with Grade I diastolic dysfunction (impaired relaxation).   2. Right ventricular systolic function is normal. The right ventricular  size is normal. Tricuspid regurgitation signal is inadequate for assessing  PA pressure.   3. The mitral valve is normal in structure. Trivial mitral valve  regurgitation. No evidence of mitral stenosis.   4. The aortic valve has an indeterminant number of  cusps. There is  moderate thickening of the aortic valve. Aortic valve regurgitation is not  visualized. Mild aortic valve stenosis. Aortic valve area, by VTI measures  1.46 cm. Aortic valve mean gradient   measures 12.3 mmHg. Aortic valve Vmax measures 2.45 m/s.   5. The inferior vena cava is dilated in size with >50% respiratory  variability, suggesting right atrial pressure of 8 mmHg.   6. Increased flow velocities may  be secondary to anemia, thyrotoxicosis,  hyperdynamic or high flow state.   Assessment and Plan:  1.  CAD managed medically with history of branch vessel and moderate RCA stenosis as of 2006.  Follow-up Lexiscan  Myoview  in June 2023 indicated evidence of previous inferior infarct scar but no active ischemia and LVEF 64%.  Reports improvement in dyspnea on exertion over the last month, no definite angina.  At this point would plan to continue medical therapy and observation.  He is on aspirin  81 mg daily, Plavix  85 mg daily, Lipitor 40 mg daily which will be increased as discussed below.   2.  HFpEF, LVEF 60 to 65% with mild LVH and normal RV contraction by echocardiogram in September.  No fluid retention and weight stable.  Continue Farxiga  10 mg daily and Lasix  20 mg every other day.  3.  Mild aortic stenosis with mean AV gradient 12 mmHg by echocardiogram in September.  Asymptomatic.   4.  Primary hypertension.  Blood pressure is well-controlled today.  Continue Benicar  20 mg daily.   5.  Mixed hyperlipidemia.  LDL 75 in November 2024..  Increase Lipitor to 80 mg daily.   6.  Carotid artery disease status post right TCAR in March 2023.  He has had progressive disease on the left.  CTA in August showed patent R ICA stent site without hemodynamically significant stenosis, but at least 75% LICA stenosis.  He has follow-up pending for vascular evaluation with Dr. Pearline later this week.  RCRI perioperative cardiac risk index is 3-4 points, approximately 10% chance of major adverse cardiac event.  No increasing angina or recent heart failure decompensation, if he needs revascularization, he should be able to proceed from a cardiac perspective at overall intermediate risk.  Disposition:  Follow up 3 to 4 months.  Signed, Jayson JUDITHANN Sierras, M.D., F.A.C.C. [facility] at [facility]

## 2024-09-28 NOTE — Progress Notes (Unsigned)
 Patient ID: Christopher Randolph, male   DOB: [birth year], 78 y.o.   MRN: [id number]  Reason for Consult: No chief complaint on file.   Referred by Lari Elspeth BRAVO, MD  Subjective:     HPI Christopher Randolph is a 78 y.o. male presenting for follow-up.  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  He was also found to have a 3.1 cm AAA.  He was last seen by Dr. Oris in March 2024. He denies any recent strokes or strokelike symptoms.  Specifically denies any one-sided weakness, numbness, amaurosis or speech issues.  He does have some fatigue, dizziness and headaches especially when it is hot outside. He denies any new or unusual abdominal or back pain.  Past Medical History:  Diagnosis Date   AAA (abdominal aortic aneurysm)    Arthritis    CAD (coronary artery disease)    Branch vessel and moderate RCA disease 2006   Cancer (HCC) 1990   Melanoma Lower right Leg   Carotid artery disease    Complication of anesthesia    blood pressure bottomed out during back surgery   COPD (chronic obstructive pulmonary disease) (HCC)    Essential hypertension    Glaucoma    Heart failure (HCC)    diastolic heart failure   Hyperlipidemia    Hypothyroidism    Lyme disease    PAD (peripheral artery disease)    Left common iliac stent 2004   Pneumonia    PONV (postoperative nausea and vomiting)    Stroke (HCC)    TIA   TIA (transient ischemic attack) 02/2022   Type 2 diabetes mellitus (HCC)    Wears dentures    Wears glasses    Family History  Problem Relation Age of Onset   Heart disease Mother    Heart attack Mother    Colon polyps Mother    Colon polyps Brother    Heart attack Maternal Grandmother    Heart attack Maternal Grandfather    Colon cancer Neg Hx    Past Surgical History:  Procedure Laterality Date   CATARACT EXTRACTION Bilateral    CATARACT EXTRACTION, BILATERAL     CERVICAL DISC SURGERY     x 1   COLONOSCOPY N/A  11/06/2016   Procedure: COLONOSCOPY;  Surgeon: Lamar CHRISTELLA Hollingshead, MD;  Location: [facility];  Service: Endoscopy;  Laterality: N/A;  7:30 AM   COLONOSCOPY  2012   Dr. Ivery: normal. reviewed reports, which states he has a history of polyps in remote past.    ELBOW SURGERY Left    EYE SURGERY Bilateral    Cat Sx   EYE SURGERY Left 01/14/2021   [facility] - Dr. Dempsey Pardon   IRIDOTOMY / IRIDECTOMY Left 01/14/2021   [facility] - Dr. Dempsey Pardon   JOINT REPLACEMENT     LUMBAR WOUND DEBRIDEMENT N/A 01/24/2019   Procedure: LUMBAR WOUND Exploration for Evacation of Seroma vs. Hematoma;  Surgeon: Alix Lamar, MD;  Location: [facility] OR;  Service: Neurosurgery;  Laterality: N/A;   MELANOMA EXCISION     right leg, [facility] at [facility] and underwent immunotherapy   NECK SURGERY      x 1 yrs ago   PARS PLANA VITRECTOMY Left 04/04/2020   Procedure: PARS PLANA VITRECTOMY WITH 25G REMOVAL/SUTURE INTRAOCULAR LENS;  Surgeon: Valdemar Rogue, MD;  Location: [facility] OR;  Service: Ophthalmology;  Laterality: Left;   REPLACEMENT TOTAL KNEE Right    TRANSCAROTID ARTERY REVASCULARIZATION  Right 03/25/2022   Procedure: Right Transcarotid Artery Revascularization;  Surgeon: Serene Gaile ORN, MD;  [facility] OR;  Service: Vascular;  Laterality: Right;   UMBILICAL HERNIA REPAIR N/A 10/22/2023   Procedure: HERNIA REPAIR UMBILICAL ADULT W/ MESH;  Surgeon: Kallie Manuelita BROCKS, MD;  Location: [facility] ORS;  Service: General;  Laterality: N/A;   WISDOM TOOTH EXTRACTION      Short Social History:  Social History   Tobacco Use   Smoking status: Former    Current packs/day: 0.00    Average packs/day: 1.5 packs/day for 40.0 years (60.0 ttl pk-yrs)    Types: Cigarettes    Start date: 11/04/1963    Quit date: 10/29/2003    Years since quitting: 20.9    Passive exposure: Never   Smokeless tobacco: Never  Substance Use Topics   Alcohol  use: Yes    Alcohol /week: 15.0 standard drinks of alcohol     Types: 14 Cans of beer, 1 Standard drinks or  equivalent per week    Comment: 2 beers or a mixed drink    Allergies  Allergen Reactions   Erythromycin Swelling    Lip swelling   Nitrostat  [Nitroglycerin ] Other (See Comments)    Severe hypotension   Shellfish Allergy Dermatitis, Rash and Other (See Comments)    Gout flares   Other Other (See Comments)    Polyester Fiberwire Material by [employer], reported reaction when tendon repaired, wound would not heal, broke down   Nitrofuran Derivatives Swelling   Zithromax [Azithromycin] Swelling    Current Outpatient Medications  Medication Sig Dispense Refill   acetaminophen  (TYLENOL ) 500 MG tablet Take 1,000 mg by mouth daily as needed for moderate pain, fever or headache.     aspirin  EC 81 MG tablet Take 81 mg by mouth daily.     atorvastatin  (LIPITOR) 80 MG tablet Take 1 tablet (80 mg total) by mouth daily. 90 tablet 3   Bromfenac  Sodium (PROLENSA ) 0.07 % SOLN Place 1 drop into the left eye daily. 6 mL 6   Bromfenac  Sodium 0.07 % SOLN Place 1 drop into the left eye daily. 6 mL 3   clopidogrel  (PLAVIX ) 75 MG tablet TAKE 1 TABLET BY MOUTH DAILY 30 tablet 11   FARXIGA  10 MG TABS tablet TAKE 1 TABLET BY MOUTH DAILY BEFORE breakfast 30 tablet 3   fluticasone  (FLONASE ) 50 MCG/ACT nasal spray Place 1 spray into both nostrils daily as needed for allergies or rhinitis.     Fluticasone -Umeclidin-Vilant (TRELEGY ELLIPTA ) 100-62.5-25 MCG/ACT AEPB Inhale 1 puff into the lungs daily. (Patient not taking: Reported on 09/27/2024) 60 each 0   furosemide  (LASIX ) 20 MG tablet Take 1 tablet (20 mg total) by mouth every other day.     ipratropium-albuterol  (DUONEB) 0.5-2.5 (3) MG/3ML SOLN Take 3 mLs by nebulization every 6 (six) hours as needed. (Patient not taking: Reported on 09/27/2024) 360 mL 1   latanoprost  (XALATAN ) 0.005 % ophthalmic solution Place 1 drop into the left eye at bedtime.     levothyroxine  (SYNTHROID ) 175 MCG tablet Take 175 mcg by mouth See admin instructions. 175 mcg every 3rd day.      levothyroxine  (SYNTHROID , LEVOTHROID) 150 MCG tablet Take 150 mcg by mouth See admin instructions. 150 mcg once daily for 2 days, 175mcg every 3rd day - repeat.  1   olmesartan  (BENICAR ) 20 MG tablet Take 20 mg by mouth daily.     prednisoLONE  acetate (PRED FORTE ) 1 % ophthalmic suspension Place 1 drop into the left eye daily. 15 mL  3   pregabalin (LYRICA) 75 MG capsule Take 75 mg by mouth 2 (two) times daily.     tamsulosin  (FLOMAX ) 0.4 MG CAPS capsule Take 0.8 mg by mouth at bedtime.     No current facility-administered medications for this visit.    REVIEW OF SYSTEMS  All other systems were reviewed and are negative     Objective:  Objective   There were no vitals filed for this visit. There is no height or weight on file to calculate BMI.  Physical Exam General: no acute distress Cardiac: hemodynamically stable Pulm: normal work of breathing Abdomen: non-tender, no pulsatile mass Neuro: alert, no focal deficit Extremities: no edema, cyanosis or wounds  Data: Carotid duplex Right Carotid: Patent stent with no stenosis.   Left Carotid: Velocities in the left ICA are consistent with a 40-59%  stenosis.   CTA head and neck from August 22 independently reviewed Patent right ICA stent. Left ICA with a proximal calcific stenosis approximately 70%.  AAA duplex Abdominal Aorta Findings:  +-----------+-------+----------+----------+--------+--------+--------+  Location  AP (cm)Trans (cm)PSV (cm/s)WaveformThrombusComments  +-----------+-------+----------+----------+--------+--------+--------+  Proximal  2.06   2.43      71                                  +-----------+-------+----------+----------+--------+--------+--------+  Mid       1.62   1.83      99                                  +-----------+-------+----------+----------+--------+--------+--------+  Distal    3.10   3.12      49                                   +-----------+-------+----------+----------+--------+--------+--------+  RT CIA Prox1.0    1.0       121       biphasic                  +-----------+-------+----------+----------+--------+--------+--------+  LT CIA Prox1.0    1.3       148                                 +-----------+-------+----------+----------+--------+--------+--------+    Summary:  Abdominal Aorta: There is evidence of abnormal dilatation of the distal  Abdominal aorta. Previous diameter measurement was obtained on CT  12/26/22: 3.1 cm.      Assessment/Plan:   Christopher Randolph is a 78 y.o. male with carotid artery stenosis with a previous right TCAR performed in 2023.  This is still widely patent.  The left ICA has an approximate 70% stenosis on CTA from August.  He continues to be asymptomatic.  Regarding his AAA it is stable in size at 3.1 cm.  Since there is a discrepancy between the duplex and CTA we will plan for repeat CTA in February which will be 6 months from his previous CTA in August.    Christopher GORMAN Serve MD [facility]

## 2024-09-29 ENCOUNTER — Ambulatory Visit (HOSPITAL_BASED_OUTPATIENT_CLINIC_OR_DEPARTMENT_OTHER)
Admission: RE | Admit: 2024-09-29 | Discharge: 2024-09-29 | Disposition: A | Discharge status: Home / Self Care | Source: Ambulatory Visit | Attending: Vascular Surgery | Admitting: Vascular Surgery

## 2024-09-29 ENCOUNTER — Ambulatory Visit: Attending: Vascular Surgery | Admitting: Vascular Surgery

## 2024-09-29 ENCOUNTER — Encounter: Payer: Self-pay | Admitting: Vascular Surgery

## 2024-09-29 ENCOUNTER — Ambulatory Visit (HOSPITAL_COMMUNITY)
Admission: RE | Admit: 2024-09-29 | Discharge: 2024-09-29 | Disposition: A | Discharge status: Home / Self Care | Source: Ambulatory Visit | Attending: Vascular Surgery | Admitting: Vascular Surgery

## 2024-09-29 VITALS — BP 144/78 | HR 55 | Temp 97.9°F | Resp 18 | Ht 68.0 in | Wt 178.0 lb

## 2024-09-29 DIAGNOSIS — I7143 Infrarenal abdominal aortic aneurysm, without rupture: Secondary | ICD-10-CM | POA: Diagnosis not present

## 2024-09-29 DIAGNOSIS — I6523 Occlusion and stenosis of bilateral carotid arteries: Secondary | ICD-10-CM

## 2024-09-29 DIAGNOSIS — I6521 Occlusion and stenosis of right carotid artery: Secondary | ICD-10-CM | POA: Insufficient documentation

## 2024-10-03 ENCOUNTER — Other Ambulatory Visit: Payer: Self-pay | Admitting: Cardiology

## 2024-10-03 ENCOUNTER — Telehealth (HOSPITAL_BASED_OUTPATIENT_CLINIC_OR_DEPARTMENT_OTHER): Payer: Self-pay | Admitting: *Deleted

## 2024-10-03 NOTE — Telephone Encounter (Signed)
   Pre-operative Risk Assessment    Patient Name: Christopher Randolph  DOB: Feb 02, 1946 MRN: [id number]   Date of last office visit: 09/27/24 DR. MCDOWELL Date of next office visit: 01/10/25 DR. MCDOWELL  Request for Surgical Clearance    Procedure:  PER FORM LOOKS TO BE 2 TEETH FOR EXTRACTION AND A DEEP CLEANING ; I TRIED x 2 TO REACH DDS TO CONFIRM IF EXTRACTIONS ARE SIMPLE OR SURGICAL, THOUGH NO ANSWERED   Date of Surgery:  Clearance TBD                                Surgeon:  NOT LISTED  Surgeon's Group or Practice Name:  [facility] Phone number:  [phone] Fax number:  [phone]   Type of Clearance Requested:   - Medical  - Pharmacy:  Hold Aspirin  and Clopidogrel  (Plavix )     Type of Anesthesia:  Local  (w/EPI)   Additional requests/questions:    Bonney Niels Jest   10/03/2024, 11:07 AM

## 2024-10-04 NOTE — Telephone Encounter (Signed)
   Patient Name: Christopher Randolph  DOB: Oct 17, 1946 MRN: [id number]  Primary Cardiologist: Jayson Sierras, MD  Chart reviewed as part of pre-operative protocol coverage.   Simple dental extractions (i.e. 1-2 teeth) are considered low risk procedures per guidelines and generally do not require any specific cardiac clearance. It is also generally accepted that for simple extractions and dental cleanings, there is no need to interrupt blood thinner therapy.   SBE prophylaxis is required for the patient from a cardiac standpoint.  I will route this recommendation to the requesting party via Epic fax function and remove from pre-op pool.  Please call with questions.  Lamarr Satterfield, NP 10/04/2024, 7:52 AM

## 2024-10-05 ENCOUNTER — Telehealth: Payer: Self-pay | Admitting: Cardiology

## 2024-10-05 MED ORDER — DAPAGLIFLOZIN PROPANEDIOL 10 MG PO TABS: 10.0000 mg | ORAL_TABLET | Freq: Every day | ORAL | 3 refills | Status: AC

## 2024-10-05 NOTE — Telephone Encounter (Signed)
*  STAT* If patient is at the pharmacy, call can be transferred to refill team.   1. Which medications need to be refilled? (please list name of each medication and dose if known)   FARXIGA  10 MG TABS tablet     2. Would you like to learn more about the convenience, safety, & potential cost savings by using the [facility]? no   3. Are you open to using the [facility] (Type [facility]. ).no    4. Which pharmacy/location (including street and city if local pharmacy) is medication to be sent to? [facility] - [place], [place] - [address]     5. Do they need a 30 day or 90 day supply? 30 day        Pharmacy called in to clarification Middle name in which we have L and they have Buddy.

## 2024-10-05 NOTE — Telephone Encounter (Signed)
 RX sent in

## 2024-11-03 ENCOUNTER — Other Ambulatory Visit: Payer: Self-pay | Admitting: Cardiology

## 2024-12-25 ENCOUNTER — Other Ambulatory Visit: Payer: Self-pay

## 2024-12-25 DIAGNOSIS — I6523 Occlusion and stenosis of bilateral carotid arteries: Secondary | ICD-10-CM

## 2025-01-09 NOTE — Progress Notes (Shared)
 " [facility] - Clinic Note  01/22/2025     CHIEF COMPLAINT Patient presents for No chief complaint on file.  HISTORY OF PRESENT ILLNESS: Christopher Randolph is a 79 y.o. male who presents to the clinic today for:    Pt states it feels like his left eye is just along for the ride, his depth perception is off, if he cuts the grass, he can't tell where he's mowed compared to where he hasn't mowed, he is using PF and bromfenac  once a day, he states his left eye stays red  Referring physician: Lari Elspeth BRAVO, MD [address] [place],  [place] [address]  HISTORICAL INFORMATION:   Selected notes from the MEDICAL RECORD NUMBER Referred by Dr. Etha for concern of vitreous hemorrhage   CURRENT MEDICATIONS: Current Outpatient Medications (Ophthalmic Drugs)  Medication Sig   Bromfenac  Sodium (PROLENSA ) 0.07 % SOLN Place 1 drop into the left eye daily.   Bromfenac  Sodium 0.07 % SOLN Place 1 drop into the left eye daily.   latanoprost  (XALATAN ) 0.005 % ophthalmic solution Place 1 drop into the left eye at bedtime.   prednisoLONE  acetate (PRED FORTE ) 1 % ophthalmic suspension Place 1 drop into the left eye daily.   No current facility-administered medications for this visit. (Ophthalmic Drugs)   Current Outpatient Medications (Other)  Medication Sig   acetaminophen  (TYLENOL ) 500 MG tablet Take 1,000 mg by mouth daily as needed for moderate pain, fever or headache.   aspirin  EC 81 MG tablet Take 81 mg by mouth daily.   atorvastatin  (LIPITOR) 80 MG tablet Take 1 tablet (80 mg total) by mouth daily.   clopidogrel  (PLAVIX ) 75 MG tablet TAKE 1 TABLET BY MOUTH DAILY   dapagliflozin  propanediol (FARXIGA ) 10 MG TABS tablet Take 1 tablet (10 mg total) by mouth daily before breakfast.   fluticasone  (FLONASE ) 50 MCG/ACT nasal spray Place 1 spray into both nostrils daily as needed for allergies or rhinitis.   furosemide  (LASIX ) 20 MG tablet Take 1 tablet (20 mg total) by mouth every  other day.   levothyroxine  (SYNTHROID ) 175 MCG tablet Take 175 mcg by mouth See admin instructions. 175 mcg every 3rd day.   levothyroxine  (SYNTHROID , LEVOTHROID) 150 MCG tablet Take 150 mcg by mouth See admin instructions. 150 mcg once daily for 2 days, 175mcg every 3rd day - repeat.   olmesartan  (BENICAR ) 20 MG tablet Take 20 mg by mouth daily.   pregabalin (LYRICA) 75 MG capsule Take 75 mg by mouth 2 (two) times daily.   tamsulosin  (FLOMAX ) 0.4 MG CAPS capsule Take 0.8 mg by mouth at bedtime.   No current facility-administered medications for this visit. (Other)   REVIEW OF SYSTEMS:    ALLERGIES Allergies  Allergen Reactions   Erythromycin Swelling    Lip swelling   Nitrostat  [Nitroglycerin ] Other (See Comments)    Severe hypotension   Shellfish Allergy Dermatitis, Rash and Other (See Comments)    Gout flares   Other Other (See Comments)    Polyester Fiberwire Material by [employer], reported reaction when tendon repaired, wound would not heal, broke down   Nitrofuran Derivatives Swelling   Zithromax [Azithromycin] Swelling   PAST MEDICAL HISTORY Past Medical History:  Diagnosis Date   AAA (abdominal aortic aneurysm)    Arthritis    CAD (coronary artery disease)    Branch vessel and moderate RCA disease 2006   Cancer (HCC) 1990   Melanoma Lower right Leg   Carotid artery disease  Complication of anesthesia    blood pressure bottomed out during back surgery   COPD (chronic obstructive pulmonary disease) (HCC)    Essential hypertension    Glaucoma    Heart failure (HCC)    diastolic heart failure   Hyperlipidemia    Hypothyroidism    Lyme disease    PAD (peripheral artery disease)    Left common iliac stent 2004   Pneumonia    PONV (postoperative nausea and vomiting)    Stroke [facility])    TIA   TIA (transient ischemic attack) 02/2022   Type 2 diabetes mellitus (HCC)    Wears dentures    Wears glasses    Past Surgical History:  Procedure Laterality Date    CATARACT EXTRACTION Bilateral    CATARACT EXTRACTION, BILATERAL     CERVICAL DISC SURGERY     x 1   COLONOSCOPY N/A 11/06/2016   Procedure: COLONOSCOPY;  Surgeon: Lamar CHRISTELLA Hollingshead, MD;  Location: [facility];  Service: Endoscopy;  Laterality: N/A;  7:30 AM   COLONOSCOPY  2012   Dr. Ivery: normal. reviewed reports, which states he has a history of polyps in remote past.    ELBOW SURGERY Left    EYE SURGERY Bilateral    Cat Sx   EYE SURGERY Left 01/14/2021   [facility] - Dr. Dempsey Pardon   IRIDOTOMY / IRIDECTOMY Left 01/14/2021   [facility] - Dr. Dempsey Pardon   JOINT REPLACEMENT     LUMBAR WOUND DEBRIDEMENT N/A 01/24/2019   Procedure: LUMBAR WOUND Exploration for Evacation of Seroma vs. Hematoma;  Surgeon: Alix Lamar, MD;  Location: [facility] OR;  Service: Neurosurgery;  Laterality: N/A;   MELANOMA EXCISION     right leg, seen at [facility] and underwent immunotherapy   NECK SURGERY      x 1 yrs ago   PARS PLANA VITRECTOMY Left 04/04/2020   Procedure: PARS PLANA VITRECTOMY WITH 25G REMOVAL/SUTURE INTRAOCULAR LENS;  Surgeon: Valdemar Rogue, MD;  Location: [facility] OR;  Service: Ophthalmology;  Laterality: Left;   REPLACEMENT TOTAL KNEE Right    TRANSCAROTID ARTERY REVASCULARIZATION  Right 03/25/2022   Procedure: Right Transcarotid Artery Revascularization;  Surgeon: Serene Gaile ORN, MD;  Location: [facility] OR;  Service: Vascular;  Laterality: Right;   UMBILICAL HERNIA REPAIR N/A 10/22/2023   Procedure: HERNIA REPAIR UMBILICAL ADULT W/ MESH;  Surgeon: Kallie Manuelita BROCKS, MD;  Location: [facility] ORS;  Service: General;  Laterality: N/A;   WISDOM TOOTH EXTRACTION     FAMILY HISTORY Family History  Problem Relation Age of Onset   Heart disease Mother    Heart attack Mother    Colon polyps Mother    Colon polyps Brother    Heart attack Maternal Grandmother    Heart attack Maternal Grandfather    Colon cancer Neg Hx    SOCIAL HISTORY Social History   Tobacco Use   Smoking status: Former    Current packs/day: 0.00     Average packs/day: 1.5 packs/day for 40.0 years (60.0 ttl pk-yrs)    Types: Cigarettes    Start date: 11/04/1963    Quit date: 10/29/2003    Years since quitting: 21.2    Passive exposure: Never   Smokeless tobacco: Never  Vaping Use   Vaping status: Never Used  Substance Use Topics   Alcohol  use: Yes    Alcohol /week: 15.0 standard drinks of alcohol     Types: 14 Cans of beer, 1 Standard drinks or equivalent per week    Comment: 2 beers or a  mixed drink   Drug use: No       OPHTHALMIC EXAM:  Not recorded    IMAGING AND PROCEDURES  Imaging and Procedures for @TODAY @          ASSESSMENT/PLAN:    ICD-10-CM   1. Uveitis-hyphema-glaucoma syndrome of left eye  T85.398A    H20.9    H40.42X0     2. 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 Pigmentary glaucoma of left eye, mild stage  H40.1321     4. Dislocated IOL (intraocular lens), anterior, left  H27.122     5. Cystoid macular edema of left eye  H35.352     6. Pseudophakia of both eyes  Z96.1     7. Lyme disease  A69.20     8. Dermatochalasis of both upper eyelids  H02.831    H02.834     9. Brow ptosis  H57.819       1-4. UGH Syndrome OS  - originally: 3 piece PCIOL OS centered, but iris has large TID in sup temp quadrant with IOL haptic within area  - tmax at [facility] OS and was started on max drops (Cosopt , Brim, latan, and po diamox)  - repeat gonio showed open angles OS, mild focal PAS  - s/p PPV w/ IOL exchange / sutured secondary Akreos OS IOL 04.08.21             - today, doing well, BCVA OS 20/25   - OCT shows stable resolution of CME/IRF             - IOP 20 OS  - s/p Ahmed Valve OS on 1.18.22 ([place])  - now released from Dr. Cristina care  - monitor   5. CME OS -- recurrent, but currently stably resolved  - mild CME that was improved on 7.21.21  - interval re-development of CME  06.21.23 (prior recurrences 08.18.21 and 12.16.22)  - s/p STK OS (08.18.21)  - OCT today (09.06.23) shows stable  resolution of central cystic changes / CME  - BCVA stable at 20/25   - cont PF and Prolensa  QDaily OS  - f/u 9 months DFE, OCT  6. Pseudophakia OU  - s/p CE/IOL OU -- pt can't remember dates, one eye by [facility], other eye by [facility]  - h/o UGH OS and now sutured IOL as above  - PCIOL OD appears to be in good position  - sutured Akreos IOL OS in good position  - monitor   7. Lyme disease  - history of diagnosis from target lesions on lower extremities  - no blood test confirmation performed   - completed doxycycline per PCP  - no manifestations in the eye, but will continue to monitor   8,9. Brow ptosis, dermatochalasis OU  - referred to Dr. Ashley (now at [facility]) for eyelid eval  - holding on repair for now  Ophthalmic Meds Ordered this visit:  No orders of the defined types were placed in this encounter.    No follow-ups on file.  There are no Patient Instructions on file for this visit.  This document serves as a record of services personally performed by Redell JUDITHANN Hans, MD, PhD. It was created on their behalf by Paulina Jamse Ashley an ophthalmic technician. The creation of this record is the provider's dictation and/or activities during the visit.   Electronically signed by: Paulina JONETTA Ashley  01/09/2025  3:11 PM   Redell JUDITHANN Hans, M.D., Ph.D. Diseases &  Surgery of the Retina and Vitreous [facility]   Abbreviations: M myopia (nearsighted); A astigmatism; H hyperopia (farsighted); P presbyopia; Mrx spectacle prescription;  CTL contact lenses; OD right eye; OS left eye; OU both eyes  XT exotropia; ET esotropia; PEK punctate epithelial keratitis; PEE punctate epithelial erosions; DES dry eye syndrome; MGD meibomian gland dysfunction; ATs artificial tears; PFAT's preservative free artificial tears; NSC nuclear sclerotic cataract; PSC posterior subcapsular cataract; ERM epi-retinal membrane; PVD posterior vitreous detachment; RD retinal detachment; DM  diabetes mellitus; DR diabetic retinopathy; NPDR non-proliferative diabetic retinopathy; PDR proliferative diabetic retinopathy; CSME clinically significant macular edema; DME diabetic macular edema; dbh dot blot hemorrhages; CWS cotton wool spot; POAG primary open angle glaucoma; C/D cup-to-disc ratio; HVF humphrey visual field; GVF goldmann visual field; OCT optical coherence tomography; IOP intraocular pressure; BRVO Branch retinal vein occlusion; CRVO central retinal vein occlusion; CRAO central retinal artery occlusion; BRAO branch retinal artery occlusion; RT retinal tear; SB scleral buckle; PPV pars plana vitrectomy; VH Vitreous hemorrhage; PRP panretinal laser photocoagulation; IVK intravitreal kenalog ; VMT vitreomacular traction; MH Macular hole;  NVD neovascularization of the disc; NVE neovascularization elsewhere; AREDS age related eye disease study; ARMD age related macular degeneration; POAG primary open angle glaucoma; EBMD epithelial/anterior basement membrane dystrophy; ACIOL anterior chamber intraocular lens; IOL intraocular lens; PCIOL posterior chamber intraocular lens; Phaco/IOL phacoemulsification with intraocular lens placement; PRK photorefractive keratectomy; LASIK laser assisted in situ keratomileusis; HTN hypertension; DM diabetes mellitus; COPD chronic obstructive pulmonary disease "

## 2025-01-10 ENCOUNTER — Ambulatory Visit: Attending: Cardiology | Admitting: Cardiology

## 2025-01-10 ENCOUNTER — Encounter: Payer: Self-pay | Admitting: Cardiology

## 2025-01-10 VITALS — BP 110/60 | HR 63 | Ht 68.0 in | Wt 177.6 lb

## 2025-01-10 DIAGNOSIS — I5032 Chronic diastolic (congestive) heart failure: Secondary | ICD-10-CM | POA: Diagnosis not present

## 2025-01-10 DIAGNOSIS — E782 Mixed hyperlipidemia: Secondary | ICD-10-CM | POA: Diagnosis not present

## 2025-01-10 DIAGNOSIS — I6523 Occlusion and stenosis of bilateral carotid arteries: Secondary | ICD-10-CM

## 2025-01-10 DIAGNOSIS — I350 Nonrheumatic aortic (valve) stenosis: Secondary | ICD-10-CM

## 2025-01-10 DIAGNOSIS — I25119 Atherosclerotic heart disease of native coronary artery with unspecified angina pectoris: Secondary | ICD-10-CM

## 2025-01-10 NOTE — Patient Instructions (Signed)
 Medication Instructions:   Your physician recommends that you continue on your current medications as directed. Please refer to the Current Medication list given to you today.   Labwork: None today  Testing/Procedures: None today  Follow-Up: 6 months Dr.McDowell  Any Other Special Instructions Will Be Listed Below (If Applicable).  If you need a refill on your cardiac medications before your next appointment, please call your pharmacy.

## 2025-01-10 NOTE — Progress Notes (Signed)
"  ° ° °  Cardiology Office Note  Date: 01/10/2025   ID: AGAPITO HANWAY, DOB 10-11-1946, MRN [id number]  History of Present Illness: Christopher Randolph is a 79 y.o. male last seen in October 2025.  He is here for a routine visit.  Reports no interval angina and stable NYHA class II dyspnea.  No palpitations or syncope.  No speech deficits or focal motor weakness.  We went over his medications.  He reports compliance with current regimen, no intolerances.  Continues to follow with Dr. Lari for primary care.  He will have a follow-up carotid CTA in the next month and then review with Dr. Pearline.  Physical Exam: VS:  BP 110/60 (BP Location: Right Arm, Cuff Size: Normal)   Pulse 63   Ht 5' 8 (1.727 m)   Wt 177 lb 9.6 oz (80.6 kg)   SpO2 93%   BMI 27.00 kg/m , BMI Body mass index is 27 kg/m.  Wt Readings from Last 3 Encounters:  01/10/25 177 lb 9.6 oz (80.6 kg)  09/29/24 178 lb (80.7 kg)  09/27/24 180 lb 9.6 oz (81.9 kg)    General: Patient appears comfortable at rest. HEENT: Conjunctiva and lids normal. Neck: Supple, no elevated JVP, bilateral carotid bruits. Lungs: Clear to auscultation, nonlabored breathing at rest. Cardiac: Regular rate and rhythm, no S3, 2/6 systolic murmur, no pericardial rub.  ECG:  An ECG dated 08/18/2024 was personally reviewed today and demonstrated:  Sinus rhythm with nonspecific T wave changes.  Labwork: January 2025: Cholesterol 142, LDL 74 08/18/2024: BUN 22; Creatinine, Ser 1.05; Hemoglobin 15.2; Platelets 106; Potassium 3.9; Sodium 140; TSH 1.601   Other Studies Reviewed Today:  No interval cardiac testing for review today.  Assessment and Plan:  1.  CAD managed medically with history of branch vessel and moderate RCA stenosis as of 2006.  Follow-up Lexiscan  Myoview  in June 2023 indicated evidence of previous inferior infarct scar but no active ischemia and LVEF 64%.  He remains clinically stable with no obvious angina.  Continue aspirin  81 mg  daily, Plavix  75 mg daily, Farxiga  10 mg daily, and statin.   2.  HFpEF, LVEF 60 to 65% with mild LVH and normal RV contraction by echocardiogram in September 2025.  No fluid retention.  Continue Farxiga  10 mg daily and Lasix  20 mg every other day.   3.  Mild aortic stenosis with mean AV gradient 12 mmHg by echocardiogram in September 2025.  Asymptomatic.  No change in cardiac murmur.   4.  Primary hypertension.  Blood pressure is well-controlled today.  Continue Benicar  20 mg daily.   5.  Mixed hyperlipidemia.  LDL 74 in January 2025.  Continue Lipitor 80 mg daily.   6.  Carotid artery disease status post right TCAR in March 2023.  He has had progressive disease on the left.  CTA in August showed patent R ICA stent site without hemodynamically significant stenosis, but at least 75% LICA stenosis.  He had follow-up with Dr. Pearline in October 2025 with plan for repeat CTA in the next month.  Disposition:  Follow up 6 months.  Signed, Jayson JUDITHANN Sierras, M.D., F.A.C.C. [facility] "

## 2025-01-18 NOTE — Progress Notes (Shared)
 " [facility] - Clinic Note  01/29/2025     CHIEF COMPLAINT Patient presents for No chief complaint on file.  HISTORY OF PRESENT ILLNESS: Christopher Randolph is a 79 y.o. male who presents to the clinic today for:    Pt states it feels like his left eye is just along for the ride, his depth perception is off, if he cuts the grass, he can't tell where he's mowed compared to where he hasn't mowed, he is using PF and bromfenac  once a day, he states his left eye stays red  Referring physician: Lari Elspeth BRAVO, MD [address] [place],  [place] [address]  HISTORICAL INFORMATION:   Selected notes from the MEDICAL RECORD NUMBER Referred by Dr. Etha for concern of vitreous hemorrhage   CURRENT MEDICATIONS: Current Outpatient Medications (Ophthalmic Drugs)  Medication Sig   Bromfenac  Sodium (PROLENSA ) 0.07 % SOLN Place 1 drop into the left eye daily.   Bromfenac  Sodium 0.07 % SOLN Place 1 drop into the left eye daily.   latanoprost  (XALATAN ) 0.005 % ophthalmic solution Place 1 drop into the left eye at bedtime.   prednisoLONE  acetate (PRED FORTE ) 1 % ophthalmic suspension Place 1 drop into the left eye daily.   No current facility-administered medications for this visit. (Ophthalmic Drugs)   Current Outpatient Medications (Other)  Medication Sig   acetaminophen  (TYLENOL ) 500 MG tablet Take 1,000 mg by mouth daily as needed for moderate pain, fever or headache.   aspirin  EC 81 MG tablet Take 81 mg by mouth daily.   atorvastatin  (LIPITOR) 80 MG tablet Take 1 tablet (80 mg total) by mouth daily.   clopidogrel  (PLAVIX ) 75 MG tablet TAKE 1 TABLET BY MOUTH DAILY   dapagliflozin  propanediol (FARXIGA ) 10 MG TABS tablet Take 1 tablet (10 mg total) by mouth daily before breakfast.   fluticasone  (FLONASE ) 50 MCG/ACT nasal spray Place 1 spray into both nostrils daily as needed for allergies or rhinitis.   furosemide  (LASIX ) 20 MG tablet Take 1 tablet (20 mg total) by  mouth every other day.   ketoconazole (NIZORAL) 2 % cream Apply 1 Application topically daily.   levothyroxine  (SYNTHROID ) 175 MCG tablet Take 175 mcg by mouth See admin instructions. 175 mcg every 3rd day.   levothyroxine  (SYNTHROID , LEVOTHROID) 150 MCG tablet Take 150 mcg by mouth See admin instructions. 150 mcg once daily for 2 days, 175mcg every 3rd day - repeat.   olmesartan  (BENICAR ) 20 MG tablet Take 20 mg by mouth daily.   pregabalin (LYRICA) 75 MG capsule Take 75 mg by mouth 2 (two) times daily.   tamsulosin  (FLOMAX ) 0.4 MG CAPS capsule Take 0.8 mg by mouth at bedtime.   triamcinolone  ointment (KENALOG ) 0.5 % Apply 1 Application topically 2 (two) times daily.   No current facility-administered medications for this visit. (Other)   REVIEW OF SYSTEMS:    ALLERGIES Allergies  Allergen Reactions   Erythromycin Swelling    Lip swelling   Nitrostat  [Nitroglycerin ] Other (See Comments)    Severe hypotension   Shellfish Allergy Dermatitis, Rash and Other (See Comments)    Gout flares   Other Other (See Comments)    Polyester Fiberwire Material by [employer], reported reaction when tendon repaired, wound would not heal, broke down   Nitrofuran Derivatives Swelling   Zithromax [Azithromycin] Swelling   PAST MEDICAL HISTORY Past Medical History:  Diagnosis Date   AAA (abdominal aortic aneurysm)    Arthritis    CAD (  coronary artery disease)    Branch vessel and moderate RCA disease 2006   Cancer (HCC) 1990   Melanoma Lower right Leg   Carotid artery disease    Complication of anesthesia    blood pressure bottomed out during back surgery   COPD (chronic obstructive pulmonary disease) (HCC)    Essential hypertension    Glaucoma    Heart failure (HCC)    diastolic heart failure   Hyperlipidemia    Hypothyroidism    Lyme disease    PAD (peripheral artery disease)    Left common iliac stent 2004   Pneumonia    PONV (postoperative nausea and vomiting)    Stroke [facility])    TIA    TIA (transient ischemic attack) 02/2022   Type 2 diabetes mellitus (HCC)    Wears dentures    Wears glasses    Past Surgical History:  Procedure Laterality Date   CATARACT EXTRACTION Bilateral    CATARACT EXTRACTION, BILATERAL     CERVICAL DISC SURGERY     x 1   COLONOSCOPY N/A 11/06/2016   Procedure: COLONOSCOPY;  Surgeon: Lamar CHRISTELLA Hollingshead, MD;  [facility];  Service: Endoscopy;  Laterality: N/A;  7:30 AM   COLONOSCOPY  2012   Dr. Ivery: normal. reviewed reports, which states he has a history of polyps in remote past.    ELBOW SURGERY Left    EYE SURGERY Bilateral    Cat Sx   EYE SURGERY Left 01/14/2021   [facility] - Dr. Dempsey Pardon   IRIDOTOMY / IRIDECTOMY Left 01/14/2021   [facility] - Dr. Dempsey Pardon   JOINT REPLACEMENT     LUMBAR WOUND DEBRIDEMENT N/A 01/24/2019   Procedure: LUMBAR WOUND Exploration for Evacation of Seroma vs. Hematoma;  Surgeon: Alix Lamar, MD;  Location: [facility] OR;  Service: Neurosurgery;  Laterality: N/A;   MELANOMA EXCISION     right leg, seen at [facility] and underwent immunotherapy   NECK SURGERY      x 1 yrs ago   PARS PLANA VITRECTOMY Left 04/04/2020   Procedure: PARS PLANA VITRECTOMY WITH 25G REMOVAL/SUTURE INTRAOCULAR LENS;  Surgeon: Valdemar Rogue, MD;  Location: [facility] OR;  Service: Ophthalmology;  Laterality: Left;   REPLACEMENT TOTAL KNEE Right    TRANSCAROTID ARTERY REVASCULARIZATION  Right 03/25/2022   Procedure: Right Transcarotid Artery Revascularization;  Surgeon: Serene Gaile ORN, MD;  Location[facility] OR;  Service: Vascular;  Laterality: Right;   UMBILICAL HERNIA REPAIR N/A 10/22/2023   Procedure: HERNIA REPAIR UMBILICAL ADULT W/ MESH;  Surgeon: Kallie Manuelita BROCKS, MD;  Location: [facility];  Service: General;  Laterality: N/A;   WISDOM TOOTH EXTRACTION     FAMILY HISTORY Family History  Problem Relation Age of Onset   Heart disease Mother    Heart attack Mother    Colon polyps Mother    Colon polyps Brother    Heart attack Maternal  Grandmother    Heart attack Maternal Grandfather    Colon cancer Neg Hx    SOCIAL HISTORY Social History   Tobacco Use   Smoking status: Former    Current packs/day: 0.00    Average packs/day: 1.5 packs/day for 40.0 years (60.0 ttl pk-yrs)    Types: Cigarettes    Start date: 11/04/1963    Quit date: 10/29/2003    Years since quitting: 21.2    Passive exposure: Never   Smokeless tobacco: Never  Vaping Use   Vaping status: Never Used  Substance Use Topics   Alcohol  use: Yes  Alcohol /week: 15.0 standard drinks of alcohol     Types: 14 Cans of beer, 1 Standard drinks or equivalent per week    Comment: 2 beers or a mixed drink   Drug use: No       OPHTHALMIC EXAM:  Not recorded    IMAGING AND PROCEDURES  Imaging and Procedures for @TODAY @          ASSESSMENT/PLAN:    ICD-10-CM   1. Uveitis-hyphema-glaucoma syndrome of left eye  T85.398A    H20.9    H40.42X0     2. 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 Pigmentary glaucoma of left eye, mild stage  H40.1321     4. Dislocated IOL (intraocular lens), anterior, left  H27.122     5. Cystoid macular edema of left eye  H35.352     6. Pseudophakia of both eyes  Z96.1     7. Lyme disease  A69.20     8. Dermatochalasis of both upper eyelids  H02.831    H02.834     9. Brow ptosis  H57.819       1-4. UGH Syndrome OS  - originally: 3 piece PCIOL OS centered, but iris has large TID in sup temp quadrant with IOL haptic within area  - tmax at [facility] 39 OS and was started on max drops (Cosopt , Brim, latan, and po diamox)  - repeat gonio showed open angles OS, mild focal PAS  - s/p PPV w/ IOL exchange / sutured secondary Akreos OS IOL 04.08.21             - today, doing well, BCVA OS 20/25   - OCT shows stable resolution of CME/IRF             - IOP 20 OS  - s/p Ahmed Valve OS on 1.18.22 ([place])  - now released from Dr. Cristina care  - monitor   5. CME OS -- recurrent, but currently stably resolved  - mild CME  that was improved on 7.21.21  - interval re-development of CME  06.21.23 (prior recurrences 08.18.21 and 12.16.22)  - s/p STK OS (08.18.21)  - OCT today (09.06.23) shows stable resolution of central cystic changes / CME  - BCVA stable at 20/25   - cont PF and Prolensa  QDaily OS  - f/u 9 months DFE, OCT  6. Pseudophakia OU  - s/p CE/IOL OU -- pt can't remember dates, one eye by [facility], other eye by [facility]  - h/o UGH OS and now sutured IOL as above  - PCIOL OD appears to be in good position  - sutured Akreos IOL OS in good position  - monitor   7. Lyme disease  - history of diagnosis from target lesions on lower extremities  - no blood test confirmation performed   - completed doxycycline per PCP  - no manifestations in the eye, but will continue to monitor   8,9. Brow ptosis, dermatochalasis OU  - referred to Dr. Ashley (now at [facility]) for eyelid eval  - holding on repair for now  Ophthalmic Meds Ordered this visit:  No orders of the defined types were placed in this encounter.    No follow-ups on file.  There are no Patient Instructions on file for this visit.  This document serves as a record of services personally performed by Redell JUDITHANN Hans, MD, PhD. It was created on their behalf by Paulina Jamse Ashley an ophthalmic technician. The creation of this record is the  provider's dictation and/or activities during the visit.   Electronically signed by: Paulina JONETTA Gay  01/18/25  10:37 AM   Redell JUDITHANN Hans, M.D., Ph.D. Diseases & Surgery of the Retina and Vitreous [facility]  Abbreviations: M myopia (nearsighted); A astigmatism; H hyperopia (farsighted); P presbyopia; Mrx spectacle prescription;  CTL contact lenses; OD right eye; OS left eye; OU both eyes  XT exotropia; ET esotropia; PEK punctate epithelial keratitis; PEE punctate epithelial erosions; DES dry eye syndrome; MGD meibomian gland dysfunction; ATs artificial tears; PFAT's preservative  free artificial tears; NSC nuclear sclerotic cataract; PSC posterior subcapsular cataract; ERM epi-retinal membrane; PVD posterior vitreous detachment; RD retinal detachment; DM diabetes mellitus; DR diabetic retinopathy; NPDR non-proliferative diabetic retinopathy; PDR proliferative diabetic retinopathy; CSME clinically significant macular edema; DME diabetic macular edema; dbh dot blot hemorrhages; CWS cotton wool spot; POAG primary open angle glaucoma; C/D cup-to-disc ratio; HVF humphrey visual field; GVF goldmann visual field; OCT optical coherence tomography; IOP intraocular pressure; BRVO Branch retinal vein occlusion; CRVO central retinal vein occlusion; CRAO central retinal artery occlusion; BRAO branch retinal artery occlusion; RT retinal tear; SB scleral buckle; PPV pars plana vitrectomy; VH Vitreous hemorrhage; PRP panretinal laser photocoagulation; IVK intravitreal kenalog ; VMT vitreomacular traction; MH Macular hole;  NVD neovascularization of the disc; NVE neovascularization elsewhere; AREDS age related eye disease study; ARMD age related macular degeneration; POAG primary open angle glaucoma; EBMD epithelial/anterior basement membrane dystrophy; ACIOL anterior chamber intraocular lens; IOL intraocular lens; PCIOL posterior chamber intraocular lens; Phaco/IOL phacoemulsification with intraocular lens placement; PRK photorefractive keratectomy; LASIK laser assisted in situ keratomileusis; HTN hypertension; DM diabetes mellitus; COPD chronic obstructive pulmonary disease "

## 2025-01-19 ENCOUNTER — Ambulatory Visit (HOSPITAL_COMMUNITY)
Admission: RE | Admit: 2025-01-19 | Discharge: 2025-01-19 | Disposition: A | Source: Ambulatory Visit | Attending: Vascular Surgery

## 2025-01-19 DIAGNOSIS — E1169 Type 2 diabetes mellitus with other specified complication: Secondary | ICD-10-CM | POA: Insufficient documentation

## 2025-01-19 DIAGNOSIS — I6523 Occlusion and stenosis of bilateral carotid arteries: Secondary | ICD-10-CM | POA: Diagnosis present

## 2025-01-19 LAB — POCT I-STAT CREATININE: Creatinine, Ser: 1.2 mg/dL (ref 0.61–1.24)

## 2025-01-19 MED ORDER — IOHEXOL 350 MG/ML SOLN
75.0000 mL | Freq: Once | INTRAVENOUS | Status: AC | PRN
  Administered 2025-01-19: 75 mL via INTRAVENOUS

## 2025-01-21 ENCOUNTER — Other Ambulatory Visit: Payer: Self-pay | Admitting: Cardiology

## 2025-01-22 ENCOUNTER — Encounter (INDEPENDENT_AMBULATORY_CARE_PROVIDER_SITE_OTHER): Admitting: Ophthalmology

## 2025-01-22 DIAGNOSIS — A6920 Lyme disease, unspecified: Secondary | ICD-10-CM

## 2025-01-22 DIAGNOSIS — H401321 Pigmentary glaucoma, left eye, mild stage: Secondary | ICD-10-CM

## 2025-01-22 DIAGNOSIS — Z961 Presence of intraocular lens: Secondary | ICD-10-CM

## 2025-01-22 DIAGNOSIS — H57819 Brow ptosis, unspecified: Secondary | ICD-10-CM

## 2025-01-22 DIAGNOSIS — H02831 Dermatochalasis of right upper eyelid: Secondary | ICD-10-CM

## 2025-01-22 DIAGNOSIS — H209 Unspecified iridocyclitis: Secondary | ICD-10-CM

## 2025-01-22 DIAGNOSIS — H35352 Cystoid macular degeneration, left eye: Secondary | ICD-10-CM

## 2025-01-22 DIAGNOSIS — H27122 Anterior dislocation of lens, left eye: Secondary | ICD-10-CM

## 2025-01-29 ENCOUNTER — Encounter (INDEPENDENT_AMBULATORY_CARE_PROVIDER_SITE_OTHER): Admitting: Ophthalmology

## 2025-01-29 DIAGNOSIS — H401321 Pigmentary glaucoma, left eye, mild stage: Secondary | ICD-10-CM

## 2025-01-29 DIAGNOSIS — H27122 Anterior dislocation of lens, left eye: Secondary | ICD-10-CM

## 2025-01-29 DIAGNOSIS — H209 Unspecified iridocyclitis: Secondary | ICD-10-CM

## 2025-01-29 DIAGNOSIS — Z961 Presence of intraocular lens: Secondary | ICD-10-CM

## 2025-01-29 DIAGNOSIS — H02834 Dermatochalasis of left upper eyelid: Secondary | ICD-10-CM

## 2025-01-29 DIAGNOSIS — H57819 Brow ptosis, unspecified: Secondary | ICD-10-CM

## 2025-01-29 DIAGNOSIS — H35352 Cystoid macular degeneration, left eye: Secondary | ICD-10-CM

## 2025-01-29 DIAGNOSIS — A6920 Lyme disease, unspecified: Secondary | ICD-10-CM

## 2025-01-29 DIAGNOSIS — T85398A Other mechanical complication of other ocular prosthetic devices, implants and grafts, initial encounter: Secondary | ICD-10-CM

## 2025-01-31 NOTE — Progress Notes (Unsigned)
 "  Patient ID: Christopher Randolph, male   DOB: [birth year], 79 y.o.   MRN: [id number]  Reason for Consult: No chief complaint on file.   Referred by Christopher Comer BRAVO, PA-C  Subjective:     HPI Christopher Randolph is a 79 y.o. male presenting for follow-up.  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  He was also found to have a 3.1 cm AAA.  I saw him 3 months ago in October and we elected to obtain a CTA as there was a discrepancy of left-sided stenosis from his CTA in August compared to his duplex in October. He continues to be asymptomatic specifically denies any one-sided weakness, numbness, amaurosis or speech issues.   Past Medical History:  Diagnosis Date   AAA (abdominal aortic aneurysm)    Arthritis    CAD (coronary artery disease)    Branch vessel and moderate RCA disease 2006   Cancer (HCC) 1990   Melanoma Lower right Leg   Carotid artery disease    Complication of anesthesia    blood pressure bottomed out during back surgery   COPD (chronic obstructive pulmonary disease) (HCC)    Essential hypertension    Glaucoma    Heart failure (HCC)    diastolic heart failure   Hyperlipidemia    Hypothyroidism    Lyme disease    PAD (peripheral artery disease)    Left common iliac stent 2004   Pneumonia    PONV (postoperative nausea and vomiting)    Stroke (HCC)    TIA   TIA (transient ischemic attack) 02/2022   Type 2 diabetes mellitus (HCC)    Wears dentures    Wears glasses    Family History  Problem Relation Age of Onset   Heart disease Mother    Heart attack Mother    Colon polyps Mother    Colon polyps Brother    Heart attack Maternal Grandmother    Heart attack Maternal Grandfather    Colon cancer Neg Hx    Past Surgical History:  Procedure Laterality Date   CATARACT EXTRACTION Bilateral    CATARACT EXTRACTION, BILATERAL     CERVICAL DISC SURGERY     x 1   COLONOSCOPY N/A 11/06/2016   Procedure: COLONOSCOPY;   Surgeon: Lamar CHRISTELLA Hollingshead, MD;  [facility];  Service: Endoscopy;  Laterality: N/A;  7:30 AM   COLONOSCOPY  2012   Dr. Ivery: normal. reviewed reports, which states he has a history of polyps in remote past.    ELBOW SURGERY Left    EYE SURGERY Bilateral    Cat Sx   EYE SURGERY Left 01/14/2021   [facility] - Dr. Dempsey Pardon   IRIDOTOMY / IRIDECTOMY Left 01/14/2021   [facility] - Dr. Dempsey Pardon   JOINT REPLACEMENT     LUMBAR WOUND DEBRIDEMENT N/A 01/24/2019   Procedure: LUMBAR WOUND Exploration for Evacation of Seroma vs. Hematoma;  Surgeon: Alix Lamar, MD;  Location: [facility] OR;  Service: Neurosurgery;  Laterality: N/A;   MELANOMA EXCISION     right leg, seen at [facility] and underwent immunotherapy   NECK SURGERY      x 1 yrs ago   PARS PLANA VITRECTOMY Left 04/04/2020   Procedure: PARS PLANA VITRECTOMY WITH 25G REMOVAL/SUTURE INTRAOCULAR LENS;  Surgeon: Valdemar Rogue, MD;  Location: [facility] OR;  Service: Ophthalmology;  Laterality: Left;   REPLACEMENT TOTAL KNEE Right    TRANSCAROTID ARTERY REVASCULARIZATION  Right 03/25/2022  Procedure: Right Transcarotid Artery Revascularization;  Surgeon: Serene Gaile ORN, MD;  Location: [facility] OR;  Service: Vascular;  Laterality: Right;   UMBILICAL HERNIA REPAIR N/A 10/22/2023   Procedure: HERNIA REPAIR UMBILICAL ADULT W/ MESH;  Surgeon: Christopher Manuelita BROCKS, MD;  Location: [facility];  Service: General;  Laterality: N/A;   WISDOM TOOTH EXTRACTION      Short Social History:  Social History   Tobacco Use   Smoking status: Former    Current packs/day: 0.00    Average packs/day: 1.5 packs/day for 40.0 years (60.0 ttl pk-yrs)    Types: Cigarettes    Start date: 11/04/1963    Quit date: 10/29/2003    Years since quitting: 21.2    Passive exposure: Never   Smokeless tobacco: Never  Substance Use Topics   Alcohol  use: Yes    Alcohol /week: 15.0 standard drinks of alcohol     Types: 14 Cans of beer, 1 Standard drinks or equivalent per week    Comment: 2 beers  or a mixed drink    Allergies  Allergen Reactions   Erythromycin Swelling    Lip swelling   Nitrostat  [Nitroglycerin ] Other (See Comments)    Severe hypotension   Shellfish Allergy Dermatitis, Rash and Other (See Comments)    Gout flares   Other Other (See Comments)    Polyester Fiberwire Material by [employer], reported reaction when tendon repaired, wound would not heal, broke down   Nitrofuran Derivatives Swelling   Zithromax [Azithromycin] Swelling    Current Outpatient Medications  Medication Sig Dispense Refill   acetaminophen  (TYLENOL ) 500 MG tablet Take 1,000 mg by mouth daily as needed for moderate pain, fever or headache.     aspirin  EC 81 MG tablet Take 81 mg by mouth daily.     atorvastatin  (LIPITOR) 80 MG tablet Take 1 tablet (80 mg total) by mouth daily. 90 tablet 3   Bromfenac  Sodium (PROLENSA ) 0.07 % SOLN Place 1 drop into the left eye daily. 6 mL 6   Bromfenac  Sodium 0.07 % SOLN Place 1 drop into the left eye daily. 6 mL 3   clopidogrel  (PLAVIX ) 75 MG tablet TAKE 1 TABLET BY MOUTH DAILY 30 tablet 11   dapagliflozin  propanediol (FARXIGA ) 10 MG TABS tablet Take 1 tablet (10 mg total) by mouth daily before breakfast. 90 tablet 3   fluticasone  (FLONASE ) 50 MCG/ACT nasal spray Place 1 spray into both nostrils daily as needed for allergies or rhinitis.     furosemide  (LASIX ) 20 MG tablet Take 1 tablet (20 mg total) by mouth every other day.     ketoconazole (NIZORAL) 2 % cream Apply 1 Application topically daily.     latanoprost  (XALATAN ) 0.005 % ophthalmic solution Place 1 drop into the left eye at bedtime.     levothyroxine  (SYNTHROID ) 175 MCG tablet Take 175 mcg by mouth See admin instructions. 175 mcg every 3rd day.     levothyroxine  (SYNTHROID , LEVOTHROID) 150 MCG tablet Take 150 mcg by mouth See admin instructions. 150 mcg once daily for 2 days, 175mcg every 3rd day - repeat.  1   olmesartan  (BENICAR ) 20 MG tablet Take 20 mg by mouth daily.     prednisoLONE  acetate  (PRED FORTE ) 1 % ophthalmic suspension Place 1 drop into the left eye daily. 15 mL 3   pregabalin (LYRICA) 75 MG capsule Take 75 mg by mouth 2 (two) times daily.     tamsulosin  (FLOMAX ) 0.4 MG CAPS capsule Take 0.8 mg by mouth at bedtime.  triamcinolone  ointment (KENALOG ) 0.5 % Apply 1 Application topically 2 (two) times daily.     No current facility-administered medications for this visit.    REVIEW OF SYSTEMS  All other systems were reviewed and are negative     Objective:  Objective   There were no vitals filed for this visit. There is no height or weight on file to calculate BMI.  Physical Exam General: no acute distress Cardiac: hemodynamically stable Neuro: alert, no focal deficit Extremities: no edema, cyanosis or wounds  Data: CTA head and neck reviewed. Moderately calcified left proximal ICA stenosis measuring greater than 80% by my measurements on CTA     Assessment/Plan:   THEUS ESPIN is a 79 y.o. male with carotid artery stenosis with a previous right TCAR performed in 2023.  This is still widely patent.  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  ***    Norman GORMAN Serve MD [facility]  "

## 2025-02-01 NOTE — Progress Notes (Shared)
 " [facility] - Clinic Note  02/06/2025     CHIEF COMPLAINT Patient presents for No chief complaint on file.  HISTORY OF PRESENT ILLNESS: Christopher Randolph is a 79 y.o. male who presents to the clinic today for:    Pt states it feels like his left eye is just along for the ride, his depth perception is off, if he cuts the grass, he can't tell where he's mowed compared to where he hasn't mowed, he is using PF and bromfenac  once a day, he states his left eye stays red  Referring physician: Lari Elspeth BRAVO, MD [address] [place],  [place] [address]  HISTORICAL INFORMATION:   Selected notes from the MEDICAL RECORD NUMBER Referred by Dr. Etha for concern of vitreous hemorrhage   CURRENT MEDICATIONS: Current Outpatient Medications (Ophthalmic Drugs)  Medication Sig   Bromfenac  Sodium (PROLENSA ) 0.07 % SOLN Place 1 drop into the left eye daily.   Bromfenac  Sodium 0.07 % SOLN Place 1 drop into the left eye daily.   latanoprost  (XALATAN ) 0.005 % ophthalmic solution Place 1 drop into the left eye at bedtime.   prednisoLONE  acetate (PRED FORTE ) 1 % ophthalmic suspension Place 1 drop into the left eye daily.   No current facility-administered medications for this visit. (Ophthalmic Drugs)   Current Outpatient Medications (Other)  Medication Sig   acetaminophen  (TYLENOL ) 500 MG tablet Take 1,000 mg by mouth daily as needed for moderate pain, fever or headache.   aspirin  EC 81 MG tablet Take 81 mg by mouth daily.   atorvastatin  (LIPITOR) 80 MG tablet Take 1 tablet (80 mg total) by mouth daily.   clopidogrel  (PLAVIX ) 75 MG tablet TAKE 1 TABLET BY MOUTH DAILY   dapagliflozin  propanediol (FARXIGA ) 10 MG TABS tablet Take 1 tablet (10 mg total) by mouth daily before breakfast.   fluticasone  (FLONASE ) 50 MCG/ACT nasal spray Place 1 spray into both nostrils daily as needed for allergies or rhinitis.   furosemide  (LASIX ) 20 MG tablet Take 1 tablet (20 mg total) by  mouth every other day.   ketoconazole (NIZORAL) 2 % cream Apply 1 Application topically daily.   levothyroxine  (SYNTHROID ) 175 MCG tablet Take 175 mcg by mouth See admin instructions. 175 mcg every 3rd day.   levothyroxine  (SYNTHROID , LEVOTHROID) 150 MCG tablet Take 150 mcg by mouth See admin instructions. 150 mcg once daily for 2 days, 175mcg every 3rd day - repeat.   olmesartan  (BENICAR ) 20 MG tablet Take 20 mg by mouth daily.   pregabalin (LYRICA) 75 MG capsule Take 75 mg by mouth 2 (two) times daily.   tamsulosin  (FLOMAX ) 0.4 MG CAPS capsule Take 0.8 mg by mouth at bedtime.   triamcinolone  ointment (KENALOG ) 0.5 % Apply 1 Application topically 2 (two) times daily.   No current facility-administered medications for this visit. (Other)   REVIEW OF SYSTEMS:    ALLERGIES Allergies  Allergen Reactions   Erythromycin Swelling    Lip swelling   Nitrostat  [Nitroglycerin ] Other (See Comments)    Severe hypotension   Shellfish Allergy Dermatitis, Rash and Other (See Comments)    Gout flares   Other Other (See Comments)    Polyester Fiberwire Material by [employer], reported reaction when tendon repaired, wound would not heal, broke down   Nitrofuran Derivatives Swelling   Zithromax [Azithromycin] Swelling   PAST MEDICAL HISTORY Past Medical History:  Diagnosis Date   AAA (abdominal aortic aneurysm)    Arthritis    CAD (  coronary artery disease)    Branch vessel and moderate RCA disease 2006   Cancer (HCC) 1990   Melanoma Lower right Leg   Carotid artery disease    Complication of anesthesia    blood pressure bottomed out during back surgery   COPD (chronic obstructive pulmonary disease) (HCC)    Essential hypertension    Glaucoma    Heart failure (HCC)    diastolic heart failure   Hyperlipidemia    Hypothyroidism    Lyme disease    PAD (peripheral artery disease)    Left common iliac stent 2004   Pneumonia    PONV (postoperative nausea and vomiting)    Stroke [facility])    TIA    TIA (transient ischemic attack) 02/2022   Type 2 diabetes mellitus (HCC)    Wears dentures    Wears glasses    Past Surgical History:  Procedure Laterality Date   CATARACT EXTRACTION Bilateral    CATARACT EXTRACTION, BILATERAL     CERVICAL DISC SURGERY     x 1   COLONOSCOPY N/A 11/06/2016   Procedure: COLONOSCOPY;  Surgeon: Lamar CHRISTELLA Hollingshead, MD;  Location: [facility];  Service: Endoscopy;  Laterality: N/A;  7:30 AM   COLONOSCOPY  2012   Dr. Ivery: normal. reviewed reports, which states he has a history of polyps in remote past.    ELBOW SURGERY Left    EYE SURGERY Bilateral    Cat Sx   EYE SURGERY Left 01/14/2021   [facility] - Dr. Dempsey Pardon   IRIDOTOMY / IRIDECTOMY Left 01/14/2021   [facility] - Dr. Dempsey Pardon   JOINT REPLACEMENT     LUMBAR WOUND DEBRIDEMENT N/A 01/24/2019   Procedure: LUMBAR WOUND Exploration for Evacation of Seroma vs. Hematoma;  Surgeon: Alix Lamar, MD;  Location: [facility] OR;  Service: Neurosurgery;  Laterality: N/A;   MELANOMA EXCISION     right leg, seen at [facility] and underwent immunotherapy   NECK SURGERY      x 1 yrs ago   PARS PLANA VITRECTOMY Left 04/04/2020   Procedure: PARS PLANA VITRECTOMY WITH 25G REMOVAL/SUTURE INTRAOCULAR LENS;  Surgeon: Valdemar Rogue, MD;  Location: [facility] OR;  Service: Ophthalmology;  Laterality: Left;   REPLACEMENT TOTAL KNEE Right    TRANSCAROTID ARTERY REVASCULARIZATION  Right 03/25/2022   Procedure: Right Transcarotid Artery Revascularization;  Surgeon: Serene Gaile ORN, MD;  Location: [facility] OR;  Service: Vascular;  Laterality: Right;   UMBILICAL HERNIA REPAIR N/A 10/22/2023   Procedure: HERNIA REPAIR UMBILICAL ADULT W/ MESH;  Surgeon: Kallie Manuelita BROCKS, MD;  Location: [facility] ORS;  Service: General;  Laterality: N/A;   WISDOM TOOTH EXTRACTION     FAMILY HISTORY Family History  Problem Relation Age of Onset   Heart disease Mother    Heart attack Mother    Colon polyps Mother    Colon polyps Brother    Heart attack Maternal  Grandmother    Heart attack Maternal Grandfather    Colon cancer Neg Hx    SOCIAL HISTORY Social History   Tobacco Use   Smoking status: Former    Current packs/day: 0.00    Average packs/day: 1.5 packs/day for 40.0 years (60.0 ttl pk-yrs)    Types: Cigarettes    Start date: 11/04/1963    Quit date: 10/29/2003    Years since quitting: 21.2    Passive exposure: Never   Smokeless tobacco: Never  Vaping Use   Vaping status: Never Used  Substance Use Topics   Alcohol  use: Yes  Alcohol /week: 15.0 standard drinks of alcohol     Types: 14 Cans of beer, 1 Standard drinks or equivalent per week    Comment: 2 beers or a mixed drink   Drug use: No       OPHTHALMIC EXAM:  Not recorded    IMAGING AND PROCEDURES  Imaging and Procedures for @TODAY @          ASSESSMENT/PLAN:  No diagnosis found.   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 Cristina care  - monitor   5. CME OS -- recurrent, but currently stably resolved  - mild CME that was improved on 7.21.21  - interval re-development of CME  06.21.23 (prior recurrences 08.18.21 and 12.16.22)  - s/p STK OS (08.18.21)  - OCT today (09.06.23) shows stable resolution of central cystic changes / CME  - BCVA stable at 20/25   - cont PF and Prolensa  QDaily OS  - f/u 9 months DFE, OCT  6. Pseudophakia OU  - s/p CE/IOL OU -- pt can't remember dates, one eye by [facility], other eye by [facility]  - h/o UGH OS and now sutured IOL as  above  - PCIOL OD appears to be in good position  - sutured Akreos IOL OS in good position  - monitor   7. Lyme disease  - history of diagnosis from target lesions on lower extremities  - no blood test confirmation performed   - completed doxycycline per PCP  - no manifestations in the eye, but will continue to monitor   8,9. Brow ptosis, dermatochalasis OU  - referred to Dr. Ashley (now at [facility]) for eyelid eval  - holding on repair for now  Ophthalmic Meds Ordered this visit:  No orders of the defined types were placed in this encounter.    No follow-ups on file.  There are no Patient Instructions on file for this visit.  This document serves as a record of services personally performed by Redell JUDITHANN Hans, MD, PhD. It was created on their behalf by Wanda GEANNIE Keens, COT an ophthalmic technician. The creation of this record is the provider's dictation and/or activities during the visit.    Electronically signed by:  Wanda GEANNIE Keens, COT  02/01/25 7:34 AM   Redell JUDITHANN Hans, M.D., Ph.D. Diseases & Surgery of the Retina and Vitreous [facility]  Abbreviations: M myopia (nearsighted); A astigmatism; H hyperopia (farsighted); P presbyopia; Mrx spectacle prescription;  CTL contact lenses; OD right eye; OS left eye; OU both eyes  XT exotropia; ET esotropia; PEK punctate epithelial keratitis; PEE punctate epithelial erosions; DES dry eye syndrome; MGD meibomian gland dysfunction; ATs artificial tears; PFAT's preservative free artificial tears; NSC nuclear sclerotic cataract; PSC posterior subcapsular cataract; ERM epi-retinal membrane; PVD posterior vitreous detachment;  RD retinal detachment; DM diabetes mellitus; DR diabetic retinopathy; NPDR non-proliferative diabetic retinopathy; PDR proliferative diabetic retinopathy; CSME clinically significant macular edema; DME diabetic macular edema; dbh dot blot hemorrhages; CWS cotton wool spot; POAG primary open angle  glaucoma; C/D cup-to-disc ratio; HVF humphrey visual field; GVF goldmann visual field; OCT optical coherence tomography; IOP intraocular pressure; BRVO Branch retinal vein occlusion; CRVO central retinal vein occlusion; CRAO central retinal artery occlusion; BRAO branch retinal artery occlusion; RT retinal tear; SB scleral buckle; PPV pars plana vitrectomy; VH Vitreous hemorrhage; PRP panretinal laser photocoagulation; IVK intravitreal kenalog ; VMT vitreomacular traction; MH Macular hole;  NVD neovascularization of the disc; NVE neovascularization elsewhere; AREDS age related eye disease study; ARMD age related macular degeneration; POAG primary open angle glaucoma; EBMD epithelial/anterior basement membrane dystrophy; ACIOL anterior chamber intraocular lens; IOL intraocular lens; PCIOL posterior chamber intraocular lens; Phaco/IOL phacoemulsification with intraocular lens placement; PRK photorefractive keratectomy; LASIK laser assisted in situ keratomileusis; HTN hypertension; DM diabetes mellitus; COPD chronic obstructive pulmonary disease "

## 2025-02-02 ENCOUNTER — Ambulatory Visit: Admitting: Vascular Surgery

## 2025-02-02 ENCOUNTER — Encounter: Payer: Self-pay | Admitting: Vascular Surgery

## 2025-02-02 VITALS — BP 125/68 | HR 66 | Temp 98.3°F | Ht 68.0 in | Wt 180.0 lb

## 2025-02-02 DIAGNOSIS — I6523 Occlusion and stenosis of bilateral carotid arteries: Secondary | ICD-10-CM

## 2025-02-06 ENCOUNTER — Encounter (INDEPENDENT_AMBULATORY_CARE_PROVIDER_SITE_OTHER): Admitting: Ophthalmology

## 2025-02-06 DIAGNOSIS — H57819 Brow ptosis, unspecified: Secondary | ICD-10-CM

## 2025-02-06 DIAGNOSIS — T85398A Other mechanical complication of other ocular prosthetic devices, implants and grafts, initial encounter: Secondary | ICD-10-CM

## 2025-02-06 DIAGNOSIS — A6920 Lyme disease, unspecified: Secondary | ICD-10-CM

## 2025-02-06 DIAGNOSIS — H02831 Dermatochalasis of right upper eyelid: Secondary | ICD-10-CM

## 2025-02-06 DIAGNOSIS — Z961 Presence of intraocular lens: Secondary | ICD-10-CM

## 2025-02-06 DIAGNOSIS — H209 Unspecified iridocyclitis: Secondary | ICD-10-CM

## 2025-02-06 DIAGNOSIS — H401321 Pigmentary glaucoma, left eye, mild stage: Secondary | ICD-10-CM

## 2025-02-06 DIAGNOSIS — H35352 Cystoid macular degeneration, left eye: Secondary | ICD-10-CM

## 2025-02-06 DIAGNOSIS — H27122 Anterior dislocation of lens, left eye: Secondary | ICD-10-CM
